# Patient Record
Sex: Female | Born: 1975
Health system: Southern US, Community
[De-identification: ages and names within clinical notes are randomized; demographics above are authoritative.]

## PROBLEM LIST (undated history)

## (undated) DIAGNOSIS — D509 Iron deficiency anemia, unspecified: Secondary | ICD-10-CM

## (undated) DIAGNOSIS — D649 Anemia, unspecified: Secondary | ICD-10-CM

## (undated) DIAGNOSIS — I10 Essential (primary) hypertension: Secondary | ICD-10-CM

## (undated) DIAGNOSIS — IMO0001 Reserved for inherently not codable concepts without codable children: Secondary | ICD-10-CM

## (undated) DIAGNOSIS — K603 Anal fistula, unspecified: Secondary | ICD-10-CM

## (undated) DIAGNOSIS — R918 Other nonspecific abnormal finding of lung field: Secondary | ICD-10-CM

## (undated) DIAGNOSIS — R51 Headache: Secondary | ICD-10-CM

## (undated) DIAGNOSIS — K5792 Diverticulitis of intestine, part unspecified, without perforation or abscess without bleeding: Secondary | ICD-10-CM

## (undated) DIAGNOSIS — B009 Herpesviral infection, unspecified: Secondary | ICD-10-CM

## (undated) DIAGNOSIS — Z8489 Family history of other specified conditions: Secondary | ICD-10-CM

## (undated) DIAGNOSIS — F909 Attention-deficit hyperactivity disorder, unspecified type: Secondary | ICD-10-CM

## (undated) DIAGNOSIS — F209 Schizophrenia, unspecified: Secondary | ICD-10-CM

## (undated) DIAGNOSIS — Z5189 Encounter for other specified aftercare: Secondary | ICD-10-CM

## (undated) DIAGNOSIS — Z8719 Personal history of other diseases of the digestive system: Secondary | ICD-10-CM

## (undated) DIAGNOSIS — E669 Obesity, unspecified: Secondary | ICD-10-CM

## (undated) DIAGNOSIS — F32A Depression, unspecified: Secondary | ICD-10-CM

## (undated) DIAGNOSIS — K760 Fatty (change of) liver, not elsewhere classified: Secondary | ICD-10-CM

## (undated) DIAGNOSIS — F429 Obsessive-compulsive disorder, unspecified: Secondary | ICD-10-CM

## (undated) DIAGNOSIS — Z860100 Personal history of colon polyps, unspecified: Secondary | ICD-10-CM

## (undated) DIAGNOSIS — D869 Sarcoidosis, unspecified: Secondary | ICD-10-CM

## (undated) DIAGNOSIS — J45909 Unspecified asthma, uncomplicated: Secondary | ICD-10-CM

## (undated) DIAGNOSIS — I639 Cerebral infarction, unspecified: Secondary | ICD-10-CM

## (undated) DIAGNOSIS — K8020 Calculus of gallbladder without cholecystitis without obstruction: Secondary | ICD-10-CM

## (undated) DIAGNOSIS — F319 Bipolar disorder, unspecified: Secondary | ICD-10-CM

## (undated) DIAGNOSIS — K219 Gastro-esophageal reflux disease without esophagitis: Secondary | ICD-10-CM

## (undated) DIAGNOSIS — F410 Panic disorder [episodic paroxysmal anxiety] without agoraphobia: Secondary | ICD-10-CM

## (undated) DIAGNOSIS — M1990 Unspecified osteoarthritis, unspecified site: Secondary | ICD-10-CM

## (undated) DIAGNOSIS — Z87442 Personal history of urinary calculi: Secondary | ICD-10-CM

## (undated) DIAGNOSIS — F329 Major depressive disorder, single episode, unspecified: Secondary | ICD-10-CM

## (undated) DIAGNOSIS — G4730 Sleep apnea, unspecified: Secondary | ICD-10-CM

## (undated) DIAGNOSIS — F419 Anxiety disorder, unspecified: Secondary | ICD-10-CM

## (undated) DIAGNOSIS — F1910 Other psychoactive substance abuse, uncomplicated: Secondary | ICD-10-CM

## (undated) DIAGNOSIS — Z933 Colostomy status: Secondary | ICD-10-CM

## (undated) DIAGNOSIS — R519 Headache, unspecified: Secondary | ICD-10-CM

## (undated) HISTORY — DX: Diverticulitis of intestine, part unspecified, without perforation or abscess without bleeding: K57.92

## (undated) HISTORY — PX: TREATMENT FISTULA ANAL: SUR1390

## (undated) HISTORY — PX: TONSILLECTOMY: SUR1361

## (undated) HISTORY — DX: Depression, unspecified: F32.A

## (undated) HISTORY — DX: Bipolar disorder, unspecified: F31.9

## (undated) HISTORY — DX: Cerebral infarction, unspecified: I63.9

## (undated) HISTORY — PX: HERNIA REPAIR: SHX51

## (undated) HISTORY — DX: Gastro-esophageal reflux disease without esophagitis: K21.9

## (undated) HISTORY — DX: Anxiety disorder, unspecified: F41.9

## (undated) HISTORY — DX: Encounter for other specified aftercare: Z51.89

## (undated) HISTORY — DX: Colostomy status: Z93.3

## (undated) HISTORY — DX: Major depressive disorder, single episode, unspecified: F32.9

## (undated) HISTORY — PX: OTHER SURGICAL HISTORY: SHX169

## (undated) HISTORY — DX: Other psychoactive substance abuse, uncomplicated: F19.10

---

## 1998-11-27 ENCOUNTER — Inpatient Hospital Stay (HOSPITAL_COMMUNITY): Admission: EM | Admit: 1998-11-27 | Discharge: 1998-12-04 | Payer: Self-pay | Admitting: *Deleted

## 2000-07-29 ENCOUNTER — Emergency Department (HOSPITAL_COMMUNITY): Admission: EM | Admit: 2000-07-29 | Discharge: 2000-07-30 | Payer: Self-pay | Admitting: Emergency Medicine

## 2000-09-27 ENCOUNTER — Emergency Department (HOSPITAL_COMMUNITY): Admission: EM | Admit: 2000-09-27 | Discharge: 2000-09-27 | Payer: Self-pay | Admitting: Internal Medicine

## 2001-07-17 ENCOUNTER — Emergency Department (HOSPITAL_COMMUNITY): Admission: EM | Admit: 2001-07-17 | Discharge: 2001-07-17 | Payer: Self-pay | Admitting: Emergency Medicine

## 2006-02-23 DIAGNOSIS — K5792 Diverticulitis of intestine, part unspecified, without perforation or abscess without bleeding: Secondary | ICD-10-CM

## 2006-02-23 HISTORY — PX: OTHER SURGICAL HISTORY: SHX169

## 2006-02-23 HISTORY — DX: Diverticulitis of intestine, part unspecified, without perforation or abscess without bleeding: K57.92

## 2006-04-24 ENCOUNTER — Emergency Department (HOSPITAL_COMMUNITY): Admission: EM | Admit: 2006-04-24 | Discharge: 2006-04-24 | Payer: Self-pay | Admitting: Emergency Medicine

## 2006-11-24 HISTORY — PX: OTHER SURGICAL HISTORY: SHX169

## 2007-07-25 HISTORY — PX: OTHER SURGICAL HISTORY: SHX169

## 2007-08-24 HISTORY — PX: OTHER SURGICAL HISTORY: SHX169

## 2007-10-25 HISTORY — PX: OTHER SURGICAL HISTORY: SHX169

## 2007-11-01 ENCOUNTER — Ambulatory Visit: Payer: Self-pay | Admitting: Internal Medicine

## 2007-11-03 ENCOUNTER — Ambulatory Visit (HOSPITAL_COMMUNITY): Admission: RE | Admit: 2007-11-03 | Discharge: 2007-11-03 | Payer: Self-pay | Admitting: Internal Medicine

## 2007-12-05 ENCOUNTER — Ambulatory Visit (HOSPITAL_COMMUNITY): Admission: RE | Admit: 2007-12-05 | Discharge: 2007-12-05 | Payer: Self-pay | Admitting: Internal Medicine

## 2008-01-05 ENCOUNTER — Ambulatory Visit (HOSPITAL_COMMUNITY): Admission: RE | Admit: 2008-01-05 | Discharge: 2008-01-05 | Payer: Self-pay | Admitting: Internal Medicine

## 2008-01-05 ENCOUNTER — Ambulatory Visit: Payer: Self-pay | Admitting: Internal Medicine

## 2008-04-24 ENCOUNTER — Other Ambulatory Visit: Admission: RE | Admit: 2008-04-24 | Discharge: 2008-04-24 | Payer: Self-pay | Admitting: Obstetrics & Gynecology

## 2010-03-04 ENCOUNTER — Encounter: Payer: Self-pay | Admitting: Internal Medicine

## 2010-03-04 ENCOUNTER — Ambulatory Visit
Admission: RE | Admit: 2010-03-04 | Discharge: 2010-03-04 | Payer: Self-pay | Source: Home / Self Care | Attending: Gastroenterology | Admitting: Gastroenterology

## 2010-03-04 ENCOUNTER — Encounter: Payer: Self-pay | Admitting: Gastroenterology

## 2010-03-04 DIAGNOSIS — K219 Gastro-esophageal reflux disease without esophagitis: Secondary | ICD-10-CM | POA: Insufficient documentation

## 2010-03-04 DIAGNOSIS — R1310 Dysphagia, unspecified: Secondary | ICD-10-CM | POA: Insufficient documentation

## 2010-03-04 DIAGNOSIS — R1319 Other dysphagia: Secondary | ICD-10-CM | POA: Insufficient documentation

## 2010-03-04 DIAGNOSIS — K625 Hemorrhage of anus and rectum: Secondary | ICD-10-CM | POA: Insufficient documentation

## 2010-03-04 DIAGNOSIS — R1011 Right upper quadrant pain: Secondary | ICD-10-CM | POA: Insufficient documentation

## 2010-03-05 ENCOUNTER — Encounter: Payer: Self-pay | Admitting: Internal Medicine

## 2010-03-06 LAB — CONVERTED CEMR LAB
ALT: 14 U/L (ref 0–35)
AST: 15 U/L (ref 0–37)
Albumin: 4.8 g/dL (ref 3.5–5.2)
Alkaline Phosphatase: 53 U/L (ref 39–117)
Amylase: 39 U/L (ref 0–105)
BUN: 13 mg/dL (ref 6–23)
Basophils Absolute: 0 K/uL (ref 0.0–0.1)
Basophils Relative: 0 % (ref 0–1)
CO2: 25 meq/L (ref 19–32)
Calcium: 9.3 mg/dL (ref 8.4–10.5)
Chloride: 102 meq/L (ref 96–112)
Creatinine, Ser: 0.69 mg/dL (ref 0.40–1.20)
Eosinophils Absolute: 0.2 K/uL (ref 0.0–0.7)
Eosinophils Relative: 3 % (ref 0–5)
Glucose, Bld: 96 mg/dL (ref 70–99)
HCT: 41.7 % (ref 36.0–46.0)
Hemoglobin: 13.5 g/dL (ref 12.0–15.0)
Lipase: 19 U/L (ref 0–75)
Lymphocytes Relative: 28 % (ref 12–46)
Lymphs Abs: 1.9 K/uL (ref 0.7–4.0)
MCHC: 32.4 g/dL (ref 30.0–36.0)
MCV: 93.5 fL (ref 78.0–100.0)
Monocytes Absolute: 0.4 K/uL (ref 0.1–1.0)
Monocytes Relative: 6 % (ref 3–12)
Neutro Abs: 4.3 K/uL (ref 1.7–7.7)
Neutrophils Relative %: 62 % (ref 43–77)
Platelets: 215 K/uL (ref 150–400)
Potassium: 4.1 meq/L (ref 3.5–5.3)
RBC: 4.46 M/uL (ref 3.87–5.11)
RDW: 13.9 % (ref 11.5–15.5)
Sodium: 140 meq/L (ref 135–145)
Total Bilirubin: 0.3 mg/dL (ref 0.3–1.2)
Total Protein: 7 g/dL (ref 6.0–8.3)
WBC: 6.8 10*3/microliter (ref 4.0–10.5)

## 2010-03-07 ENCOUNTER — Ambulatory Visit (HOSPITAL_COMMUNITY)
Admission: RE | Admit: 2010-03-07 | Discharge: 2010-03-07 | Payer: Self-pay | Source: Home / Self Care | Attending: Internal Medicine | Admitting: Internal Medicine

## 2010-03-10 LAB — HCG, QUANTITATIVE, PREGNANCY: hCG, Beta Chain, Quant, S: <2 m[IU]/mL (ref <5)

## 2010-03-11 ENCOUNTER — Encounter: Payer: Self-pay | Admitting: Internal Medicine

## 2010-03-13 ENCOUNTER — Ambulatory Visit (HOSPITAL_COMMUNITY)
Admission: RE | Admit: 2010-03-13 | Discharge: 2010-03-13 | Payer: Self-pay | Source: Home / Self Care | Attending: Internal Medicine | Admitting: Internal Medicine

## 2010-03-13 HISTORY — PX: COLONOSCOPY: SHX5424

## 2010-03-13 HISTORY — PX: ESOPHAGOGASTRODUODENOSCOPY: SHX1529

## 2010-03-14 ENCOUNTER — Encounter (HOSPITAL_COMMUNITY)
Admission: RE | Admit: 2010-03-14 | Discharge: 2010-03-25 | Payer: Self-pay | Source: Home / Self Care | Attending: Internal Medicine | Admitting: Internal Medicine

## 2010-03-21 ENCOUNTER — Encounter: Payer: Self-pay | Admitting: Internal Medicine

## 2010-03-25 NOTE — Op Note (Signed)
[name], [name]                ACCOUNT NO.:  [id number]  MEDICAL RECORD NO.:  [id number]          PATIENT TYPE:  AMB  LOCATION:  DAY                           FACILITY:  [facility]  PHYSICIAN:  R. Roetta Sessions, M.D. DATE OF BIRTH:  01-20-1976  DATE OF PROCEDURE:  03/13/2010 DATE OF DISCHARGE:                              OPERATIVE REPORT   PROCEDURE:  Esophagogastroduodenoscopy with [name] dilation followed by diagnostic colonoscopy.  INDICATIONS FOR PROCEDURE:  A 35 year old lady with intermittent right upper quadrant abdominal pain, esophageal dysphagia to solid food, and intermittent medication setting of constipation.  She has known cholelithiasis without evidence of cholecystitis on recent ultrasound, Chem 20, amylase and lipase came back normal.  EGD and colonoscopy now being done.  Risks, benefits, limitations, imponderables, alternative have been discussed, questions answered.  Please see the documentation medical record.  PROCEDURE NOTE:  O2 saturation, blood pressure, pulse, and respirations monitored throughout the entire procedure.  CONSCIOUS SEDATION:  Because of polypharmacy she is being done under propofol with help of Dr. [name].  Cetacaine spray for topical pharyngeal anesthesia.  INSTRUMENT:  Pentax video chip system.  FINDINGS:  EGD examination of the tubular esophagus revealed no mucosal abnormalities.  EG junction easily traversed.  Stomach:  Gastric cavity was emptied, insufflated well with air.  A thorough examination of the gastric mucosa including retroflex of the proximal stomach esophagogastric junction demonstrated multiple antral erosions and a small hiatal hernia, otherwise, there was no evidence of peptic ulcer disease, infiltrate process, or other abnormality.  Pylorus was patent, easily traversed.  Examination of the bulb and second portion revealed no abnormalities.  THERAPEUTIC/DIAGNOSTIC MANEUVERS PERFORMED:  Scope was  withdrawn, a 54- [place] Maloney dilators passed in full insertion.  A look back revealed no apparent complication related to passage of dilation.  Subsequent biopsies taken for histologic study.  The patient tolerated the procedure well and was prepared for colonoscopy.  Digital rectal exam revealed a single external hemorrhoidal tag, otherwise negative.  ENDOSCOPIC FINDINGS:  Prep was adequate.  Colonic mucosa was surveyed from the rectosigmoid junction through the left transverse right colon to the appendiceal orifice, ileocecal valve/cecum.  These structures were well seen and photographed for the record.  Terminal ileum was intubated to 10 cm.  From this level, the scope was slowly and cautiously withdrawn.  All previously mentioned mucosal surfaces were again seen.  The patient had a surgical anastomosis at 10 cm of the anal verge.  The residual colonic mucosa was well seen.  The patient had pancolonic diverticula.  The terminal ileum mucosa appeared normal.  The scope was pulled down the rectum where thorough examination of rectal mucosa including retroflexed view of the anal verge demonstrated no abnormalities.  The patient tolerated the procedure well.  Cecal withdrawal time 6 minutes.  IMPRESSION: 1. Normal-appearing esophagus, status post passage of a Maloney     dilator. 2. Small hiatal hernia. 3. Antral erosions, status post biopsy, otherwise stomach     unremarkable.  Patent pylorus, normal D1-D2.  Colonoscopy findings,     external hemorrhoids likely cause of hematochezia, otherwise normal  rectum.  Surgical anastomosis 10 cm of anal verge, pancolonic     diverticula, remainder of residual colonic mucosa and terminal     mucosa appeared normal.  RECOMMENDATIONS: 1. Pettey finishing up a 10-day course of Anusol-HC suppositories. 2. Continue fiber supplement daily. 3. Continue Prilosec 20 mg orally before breakfast daily. 4. Follow up on path.  Her right upper  quadrant symptoms are not explained by findings on today's EGD.  She does have chronic constipation, likely a separate issue.  Plans are being made for to have a HIDA with fatty meal challenge tomorrow to further evaluate for potential gallbladder etiology, given she does have stones, although her symptoms are somewhat atypical from biliary etiology.  In addition, we will provide her with diverticulosis, hemorrhoid, and constipation literature.  I advised her to take MiraLax 17 g orally nightly everyday as needed if no bowel movement on any given day, further recommendations to follow.     [name], M.D.     RMR/MEDQ  D:  03/13/2010  T:  03/13/2010  Job:  [id number]  cc:   Kirk Ruths, M.D. Fax: [phone]  Electronically Signed by [name] M.D. on 03/25/2010 01:38:23 PM

## 2010-03-27 NOTE — Letter (Signed)
Summary: EGD/ED ORDER  EGD/ED ORDER   Imported By: Ave Filter 03/04/2010 09:46:56  _____________________________________________________________________  External Attachment:    Type:   Image     Comment:   External Document

## 2010-03-27 NOTE — Letter (Addendum)
Summary: Patient Notice, Endo Biopsy Results  [facility]  [address]   [place], [place] [address]   Phone: [phone]  Fax: [phone]       March 21, 2010   [name] [address] [place], [place]  [address] 1975/06/05    Dear Ms. Magaw,  I am pleased to inform you that the biopsies taken during your recent endoscopic examination did not show any evidence of cancer upon pathologic examination.  There was only mild inflammation.  Additional information/recommendations:  No further action is needed at this time.  Please follow-up with your primary care physician for your other healthcare needs.  Continue with the treatment plan as outlined on the day of your exam.  Please call us if you are having persistent problems or have questions about your condition that have not been fully answered at this time.  Sincerely,    R. Roetta Sessions MD, FACP [facility] Ph: [phone]   Fax: [phone]   Appended Document: Patient Notice, Endo Biopsy Results letter mailed to pt

## 2010-03-27 NOTE — Letter (Signed)
Summary: HIDA SCAN ORDER  HIDA SCAN ORDER   Imported By: Ave Filter 03/11/2010 09:10:25  _____________________________________________________________________  External Attachment:    Type:   Image     Comment:   External Document

## 2010-03-27 NOTE — Letter (Signed)
Summary: ABD U/S ORDER  ABD U/S ORDER   Imported By: Ave Filter 03/04/2010 09:46:27  _____________________________________________________________________  External Attachment:    Type:   Image     Comment:   External Document  Appended Document: ABD U/S ORDER [id number]

## 2010-03-27 NOTE — Assessment & Plan Note (Signed)
Summary: BRBPR/LAW   Visit Type:  Follow-up Visit Primary Care Provider:  Dr. Regino Schultze  CC:  brbpr, indigestion, and pain on R side.  History of Present Illness: [name] is here to schedule a colonoscopy. Reports brbpr for approximately 6 mos, on tissue and in toilet after BM. Denies rectal pain or itching. BM once every 2-3 weeks unless she takes ex-lax. Not taking daily fiber supplements. Not on stool softeners. Doesn't like taste of miralax. hx of reflux, but for one month has been experiencing mid-back pain, like a knife twisting, 10/10, worsened by eating/drinking. No epigastric pain. Does have relief with Tums, but if eats food is worsened. Eating approximately 10 tums/day. +esophageal dysphagia, vomiting every few meals, worse with sweets such as candy. c/o RUQ pain only with ex-lax. Reports at least X 1 year. feels like a "fist punching", 8/10. Denies nausea. Recently in hospital November 2011 at [facility] (mental health admission) secondary to severe depression, hopeless. Switched some medications. Feels better now, denies suicidal ideations.     Nov 2009: Friable anal canal hemorrhoids, otherwise normal rectum, anastomosis   with small bowel 15 cm and anastomosis was patent, but also somewhat   friable; single distal colonic diverticulum.  Remainder of colonic   mucosa and terminal ileal mucosa appeared normal.   Current Medications (verified): 1)  Cerefolin Nac .... Once Daily 2)  Depakote 250 Mg Tbec (Divalproex Sodium) .... At Bedtime 3)  Depakote 500 Mg Tbec (Divalproex Sodium) .... 2 At Bedtime 4)  Trilafon 2mg  .... Two Times A Day 5)  Trilafon 4mg  .... 3 At Bedtime 6)  Latuda 80 Mg Tabs (Lurasidone Hcl) .... Once Daily 7)  Ativan 1 Mg Tabs (Lorazepam) .... At Bedtime 8)  Adderall 10 Mg Tabs (Amphetamine-Dextroamphetamine) .... Two Times A Day 9)  Tums .... As Needed 10)  Ex-Lax .... As Needed 11)  Dulcolax .... As Needed  Allergies (verified): No Known Drug  Allergies  Past History:  Past Medical History: GERD perforated diverticulitis 2008 requiring resection, colostomy, later colostomy take-down Bipolar depression anxiety  Past Surgical History: exploratory laparotomy with resection in 2008: perforated diverticulitis, colostomy placed Oct 2008: colostomy reversal June 2009: abdominal wall hernia, open repair with lysis of adhesions hx abd wall seroma July 2009 drained via [place] in [place], [place], Sept 2009 drained again by Dr. Leticia Penna in office tonsillectomy kidney stones  Family History: Mother:living, none Father:living, none  Maternal Grandmother: hx of esophageal dilations, GERD, Bipolar No FH of Colon Cancer: Maternal Grandfather: lung ca  Social History: Unemployed Single, lives with family 1 child, boy, age 63 Patient currently smokes. 1ppd X 10 years Alcohol Use - not currently, hx of social ETOH use in distant past Denies drug use now or in past (however notes in chart state +hx of marijuana use) Smoking Status:  current  Review of Systems General:  Denies fever, chills, and anorexia. Eyes:  Denies blurring, irritation, and discharge. ENT:  Complains of difficulty swallowing; denies sore throat and hoarseness. CV:  Denies chest pains, syncope, and dyspnea on exertion. Resp:  Denies dyspnea at rest and wheezing. GI:  Complains of difficulty swallowing, indigestion/heartburn, abdominal pain, constipation, and bloody BM's; denies vomiting. GU:  Denies urinary burning, blood in urine, and urinary frequency. MS:  Denies joint pain / LOM, joint swelling, and joint stiffness. Derm:  Denies rash, itching, and dry skin. Neuro:  Denies weakness and syncope. Psych:  Denies suicidal ideation, hallucinations, and paranoia. Endo:  Denies cold intolerance and heat intolerance. Heme:  Denies bruising and bleeding.  Vital Signs:  Patient profile:   35 year old [name] Height:      68 inches Weight:      218 pounds BMI:      Melissa.27 Temp:     97.8 degrees F oral Pulse rate:   72 / minute BP sitting:   130 / 92  (left arm) Cuff size:   large  Vitals Entered By: [name] (March 04, 2010 8:27 AM)  Physical Exam  General:  healthy, well-dressed, somewhat flat affect, of stated age. Head:  Normocephalic and atraumatic. Eyes:  sclera without icterus Mouth:  No deformity or lesions, dentition normal. Lungs:  Clear throughout to auscultation. Heart:  Regular rate and rhythm; no murmurs, rubs,  or bruits. Abdomen:  +BS, mild tenderness RUQ, no distention, no HSM, no rebound or guarding. Msk:  Symmetrical with no gross deformities. Normal posture. Pulses:  Normal pulses noted. Extremities:  No clubbing, cyanosis, edema or deformities noted. Neurologic:  Alert and  oriented x4;  grossly normal neurologically. Psych:  somewhat flat affect  Impression & Recommendations:  Problem # 1:  RECTAL BLEEDING (ICD-60.29) 35 year old [name] with unfortunate hx of perforated diverticulitis requiring resection, colostomy, later reversal. Last colonoscopy in 2009 showing friable anal canal hemorrhoids, see HPI for further report. +brbpr X 6 mos on tissue and small amount after BM. Chronic constipation with sometimes BM every 2-3 weeks. No current fiber or stool softeners, does not like mirlax. Likely low-volume hematochezia secondary to hx of hemorrhoids exacerbated by constipation. Will d/w Dr. Jena [name] need for repeat colonoscopy and/or schedule for future colonoscopies.   Fiber supplement of choice daily (samples given) Probiotic daily (samples given) Anusol 25 mg supp per rectum two times a day X 10 days Will check labs to assess for anemia, d/w Dr. Jena [name] need for repeat colonoscopy secondary to [name]'s hx  Orders: T-CBC w/Diff [id number]) T-Amylase [id number]) T-Lipase [phone]) T-Comprehensive Metabolic Panel ([id number]) Est. Patient Level IV (53664)  Problem # 2:  RUQ PAIN (ICD-789.01) RUQ pain X 1  year, only occurs when ingesting laxative, "fist punching", 8/10. No nausea. Also reports mid-back pain, like a knife twisting, 10/10, worsened by eating/drinking. NO epigastric pain or discomfort. Relieved by Tums, now eating 10 tums/day. + esophageal dysphagia with +emesis every few meals per [name]. hx of chronic reflux. Doubt biliary or pancreatic component, but will assess with [place] and labs. Diff dx include gastritis, PUD, uncontrolled GERD, esophageal web, stricture, or ring.   EGD/ED with Dr. Jena [name] in near future in OR secondary to polypharmacy: the risks, benefits, alternatives have been discussed in detail; [name] wishes to proceed. [place] of abdomen to assess for gallstones, consider HIDA scan if EGD negative and/or stones/sludge on [place] Prilosec 20 mg by mouth 30 minutes prior to meals daily Labs to include lipase, amylase, CMP  Orders: T-CBC w/Diff ([id number]) T-Amylase ([id number]) T-Lipase ([id number]) T-Comprehensive Metabolic Panel ([id number]) Est. Patient Level IV ([address])  Problem # 3:  GERD (ICD-530.81) See # 2 Orders: T-CBC w/Diff ([id number]) T-Amylase ([id number]) T-Lipase ([id number]) T-Comprehensive Metabolic Panel (06269-48546) Est. Patient Level IV ([address])  Problem # 4:  DYSPHAGIA (KKX-381.82) See # 2  Patient Instructions: 1)  Take Prilosec 20 mg by mouth 30 minutes before morning meal daily. Samples given 2)  Anusol suppositories twice a day X 10 days 3)  Fiber supplement of your choice daily (metamucil samples given) 4)  Probiotic daily (align samples given) 5)  We have set you up for an Ultrasound  of your belly and will call you with the results and further work-up 6)  We have also set you up for an upper endoscopy due to your difficulty swallowing and pain. We will discuss the need for a colonoscopy as well. 7)  Please get the labwork done, and we will notify you of the results. 8)  The medication list was reviewed and reconciled.  All changed / newly  prescribed medications were explained.  A complete medication list was provided to the patient / caregiver.  Prescriptions: ALIGN  CAPS (PROBIOTIC PRODUCT) take 1 by mouth daily  #31 x 1   Entered and Authorized by:   Gerrit Halls NP   Signed by:   Gerrit Halls NP on 03/04/2010   Method used:   Faxed to ...       [facility]  [address] [phone]* (retail)       [address]       [place], [place]  [address]       Ph: [phone]       Fax: [phone]   RxID:   [id number] ANUSOL-HC 25 MG SUPP (HYDROCORTISONE ACETATE) 1 per rectum two times a day X 10 days  #20 x 0   Entered and Authorized by:   Gerrit Halls NP   Signed by:   Gerrit Halls NP on 03/04/2010   Method used:   Faxed to ...       [facility] [phone][facility] (retail)       [address]       [place], [place]  [address]       Ph: [phone]       Fax: [phone]   RxID:   [id number] PRILOSEC 20 MG CPDR (OMEPRAZOLE) take 1 by mouth daily 30 minutes before meals  #31 x 1   Entered and Authorized by:   Gerrit Halls NP   Signed by:   Gerrit Halls NP on 03/04/2010   Method used:   Faxed to ...       [facility] [phone]* (retail)       [address]       [place], [place]  [address]       Ph: [phone]       Fax: [phone]   RxID:   [id number]   Appended Document: BRBPR/LAW AFter discussion with Dr. Jena [name], will add colonoscopy to EGD to be done in the OR secondary to polypharmacy. Due to [name]'s significant hx as well as new onset brbpr, warrants further investigation.   Appended Document: BRBPR/LAW I added tcs to the egd with ed in the or.New order faxed to endo.

## 2010-03-27 NOTE — Letter (Signed)
Summary: [facility]/EGD/ED ODER  [facility]/EGD/ED ODER   Imported By: Rexene Alberts 03/05/2010 14:31:56  _____________________________________________________________________  External Attachment:    Type:   Image     Comment:   External Document

## 2010-07-08 NOTE — Op Note (Signed)
[name], [name]                ACCOUNT NO.:  [id number]   MEDICAL RECORD NO.:  [id number]          PATIENT TYPE:  AMB   LOCATION:  DAY                           FACILITY:  [facility]   PHYSICIAN:  R. Roetta Sessions, M.D. DATE OF BIRTH:  [birth year]   DATE OF PROCEDURE:  01/05/2008  DATE OF DISCHARGE:                               OPERATIVE REPORT   PROCEDURE:  Diagnostic colonoscopy.   INDICATIONS FOR PROCEDURE:  A 35 year old lady has had complicated  diverticulitis with abscess requiring a segmental resection last year  down at [place], [place] subsequent to takedown.  Postoperative course was complicated by large seroma.  She moved up this  way and had seen [place] and Dr. Leticia Penna.  Recently, Dr. Leticia Penna arranged to  have the seroma drained.  This has been associated with marked  improvement in [name]' abdominal wall pain, but she is still having  some symptoms and she tells me that Dr. Leticia Penna is contemplating a  followup CT later this month.  He has had intermittent hematochezia.  There is no history of chronic GI or liver illness.  She has never had a  lower GI tract image intraluminally.  Colonoscopy is now being done.  Risks, benefits, and alternatives have been reviewed and questions  answered.  Please see the documentation in the medical record.   PROCEDURE NOTE:  O2 saturation, blood pressure, pulse, and respirations  were monitored throughout the entire procedure.   CONSCIOUS SEDATION:  Versed 7 mg IV, Demerol 150 mg IV in divided doses,  and Phenergan 25 mg IV diluted slow IV push to augment conscious  sedation.   FINDINGS:  Digital rectal exam revealed no abnormalities.  Endoscopic  findings:  The prep was marginal to poor.  The examination of rectal  mucosa including retroflexed view of the anal verge demonstrated no  abnormalities.  On fluoroscopy, the anal canal however, deal with  demonstrated friable anal canal hemorrhoids.  The surgical anastomosis  was  slightly friable as well as readily identified at 15 cm from the  anal verge, it otherwise appeared normal.  The anastomosis was widely  patent.  Examination of the residual colonic mucosa was undertaken from  the anastomosis all the way to the cecum.  Cecum, ileocecal valve, and  appendiceal orifice were well seen and photographed for the record.  From this level, scope was slowly and cautiously withdrawn.  All  previously mentioned mucosal surfaces were again seen.  Terminal ileum  was elevated to 10 cm.  The residual colonic mucosa appeared normal  except for a single distal colonic diverticulum.  The remainder of  colonic mucosa and terminal ileal mucosa appeared normal.  The patient  tolerated the procedure well and was reactive to Endoscopy.   IMPRESSION:  Friable anal canal, otherwise normal rectum, anastomosis  with small bowel 15 cm and anastomosis was patent, but also somewhat  friable; single distal colonic diverticulum.  Remainder of colonic  mucosa and terminal ileal mucosa appeared normal.   RECOMMENDATIONS:  I told [name] she ought to be prudent for taking  daily fiber  supplement.  We will give her a 10-day course of Anusol-HC  suppositories and prior diverticulosis and hemorrhoid literature   Strongly recommended that she follow up with Dr. Leticia Penna in regards to  any further potential evaluation of her seroma.      Jonathon Bellows, M.D.  Electronically Signed     RMR/MEDQ  D:  01/05/2008  T:  01/05/2008  Job:  [id number]   cc:   Tilford Pillar, MD  Fax: [phone]   Madelin Rear. Sherwood Gambler, MD  Fax: [phone]

## 2010-07-08 NOTE — Assessment & Plan Note (Signed)
[name], [name]                 CHART#:  [id number]   DATE:  11/01/2007                       DOB:  Jun 23, 1975   CHIEF COMPLAINT:  Needed colonoscopy in November and abdominal fluid,  needs draining.   HISTORY OF PRESENT ILLNESS:  The patient is a 35 year old Caucasian  female who presents today as a self-referral for the above-stated  symptoms.  Her GI history is complicated in the fact that she was  unfortunate enough to have perforated diverticulitis back in June 2008.  She states prior to that episode, she had had some chronic off and on  abdominal pain and had been treated empirically with some antibiotics.  In April 2008, she has had some blood in her stool.  In June 2008, she  presented with abdominal pain.  Per her report, they did not see the  diverticulitis on the CT and had to have an exploratory surgery.  She  was found to have a perforated viscus from diverticulitis.  She had a  colostomy.  She recovered from that surgery okay and continued to have  some left lower quadrant abdominal pain.  In October 2008, she had a  reversal of her colostomy.  She states she has had alternating bowel  movements with 3 and 4 loose stools daily and may skip a day at a time  without any BMs.  She has a lot of discomfort in left lower quadrant as  well as the right upper quadrant region.  Because of some abdominal wall  hernias, she actually had a third surgery in June 2009 with repair of  abdominal wall hernias as well as lysis of adhesions.  She continues to  have pulling in the left lower quadrant.  She complains of stabbing-like  pains here as well as some pain in her back.  She has had some  hematochezia couple of months ago.  She developed according to medical  records, recurrent abdominal wall seroma.  She states she had this  aspirated on one occasion via ultrasound guidance.  The records we  received show that this was done on September 16, 2007.  The cytology report  revealed  abundant blood with blood elements consistent of neutrophils,  lymphocytes, eosinophils, few macrophages, and scattered plasma cells,  but no malignant cells.  She had a total of 1.7 liters removed.  Analysis showed 79,000 red cells and 350 white cells, but no culture was  done.  The patient states she took a course of Cipro for this.  She  states she was also given Levaquin, which she was instructed to take  after she have the next draining done, however, she moved in the interim  and this has not been done.  She feels like she has a significant amount  of fluid in her belly.  She complains of abdominal pain associated with  this.  She is concerned she may be developing diverticulitis in the  right colon as well.  She has never had a colonoscopy.   CURRENT MEDICATIONS:  Lexapro 20 mg nightly, hydrocodone 2 daily, and  Prevacid p.r.n.   ALLERGIES:  No known drug allergies.   PAST MEDICAL HISTORY:  History of complicated diverticulitis with  perforation, requiring colostomy as outlined above.  Reversal of  colostomy in October 2008.  Surgery for hernia repair and  adhesions in  June 2009.  Prior tonsillectomy, surgery for kidney stones, and history  of GERD/LPR seen by ENT before presented with hoarseness.  History of  herpes simplex for which she takes Valtrex on occasion.   FAMILY HISTORY:  Negative for colorectal cancer.   SOCIAL HISTORY:  She is single.  She has 2 children.  She is unemployed.  She smokes fourth pack of cigarettes daily.  She denies any alcohol use,  but according to Dr. Sharlene Dory in July 2009, she reported alcoholic  beverages 3-5 times a week as well as a history of marijuana use.   REVIEW OF SYSTEMS:  See HPI for GI.  CONSTITUTIONAL:  Denies any weight  loss.  CARDIOPULMONARY:  Denies any chest pain or shortness of breath.  GENITOURINARY:  Denies any dysuria and hematuria.   PHYSICAL EXAMINATION:  VITAL SIGNS:  Weight 215, height 5 feet 8 inches,  temperature  98, blood pressure 122/84, and pulse 76.  GENERAL:  Pleasant, well-groomed, well-nourished, and well-developed  Caucasian female, in no acute distress.  She does seem to respond  somewhat slowly verbally.  Speech is not slurred and she is alert during  the exam.  SKIN:  Warm and dry.  No jaundice.  HEENT:  Sclerae nonicteric.  Oropharyngeal mucosa moist and pink.  CHEST:  Lungs are clear to auscultation.  CARDIAC:  Regular rate and rhythm.  Normal S1 and S2.  No murmurs, rubs,  or gallops.  ABDOMEN:  Positive bowel sounds.  Abdomen is soft.  She has a large  midline incisional scar well healed as well as scar in the left mid  abdomen from prior colostomy.  Abdomen is very soft.  She has some mild  tenderness in the left mid to left lower quadrant region as well as the  right upper quadrant region.  No rebound or guarding.  No organomegaly  or masses.  No abdominal bruits or hernias.  LOWER EXTREMITIES:  No edema.   LABS:  From September 16, 2007, hemoglobin 11.6, platelets 283,000, and white  count 6300.   IMPRESSION:  The patient is of 35 year old lady with history of  complicated diverticulitis with perforation, requiring resection and  colostomy with eventual reversal of her colostomy.  She has since had  abdominal wall hernias and lysis of adhesions in June 2009.  She has  developed according to her history, recurrent abdominal wall seroma.  She is now transferring her care to Dr. Jena Gauss given that she has  recently moved back to the area.  She continues to have ongoing  abdominal pain in the left abdomen as well as right upper quadrant  region.  She continues to have multiple loose stools a day alternating  with occasional constipation.  She has intermittent low-volume  hematochezia.  At this point in time, we need to pursue reassessment of  her abdominal pain and history of abdominal wall fluid.  I have  discussed case with Dr. Jena Gauss.  He will begin with a CT of the abdomen  and  pelvis.  We will make further recommendations based on that study.  She does need to have a colonoscopy at some point in the near future.  We will plan on the end of October for now.   PLAN:  1. Colonoscopy with Dr. Jena Gauss in October given her history of      complicated diverticulitis and ongoing intermittent hematochezia.  2. She will continue Prevacid as before for reflux.  3. Retrieve her last CT report  done in September 12, 2007, at [facility]      in [place].  4. CT of the abdomen and pelvis with IV and oral contrast now.  5. Further recommendations to follow.       Tana Coast, P.A.  Electronically Signed     R. Roetta Sessions, M.D.  Electronically Signed    LL/MEDQ  D:  11/01/2007  T:  11/01/2007  Job:  [id number]   cc:   Madelin Rear. Sherwood Gambler, MD

## 2010-10-12 ENCOUNTER — Encounter (HOSPITAL_COMMUNITY): Payer: Self-pay | Admitting: *Deleted

## 2010-10-12 ENCOUNTER — Emergency Department (HOSPITAL_COMMUNITY): Payer: Medicaid Other

## 2010-10-12 ENCOUNTER — Emergency Department (HOSPITAL_COMMUNITY)
Admission: EM | Admit: 2010-10-12 | Discharge: 2010-10-12 | Disposition: A | Discharge status: Home Health | Payer: Medicaid Other | Attending: Emergency Medicine | Admitting: Emergency Medicine

## 2010-10-12 DIAGNOSIS — R109 Unspecified abdominal pain: Secondary | ICD-10-CM | POA: Insufficient documentation

## 2010-10-12 DIAGNOSIS — R1084 Generalized abdominal pain: Secondary | ICD-10-CM

## 2010-10-12 LAB — PREGNANCY, URINE: Preg Test, Ur: NEGATIVE

## 2010-10-12 LAB — DIFFERENTIAL
Basophils Absolute: 0 10*3/uL (ref 0.0–0.1)
Basophils Relative: 0 % (ref 0–1)
Eosinophils Absolute: 0.4 10*3/uL (ref 0.0–0.7)
Eosinophils Relative: 4 % (ref 0–5)
Lymphocytes Relative: 31 % (ref 12–46)
Lymphs Abs: 2.8 10*3/uL (ref 0.7–4.0)
Monocytes Absolute: 0.6 10*3/uL (ref 0.1–1.0)
Monocytes Relative: 6 % (ref 3–12)
Neutro Abs: 5.4 10*3/uL (ref 1.7–7.7)
Neutrophils Relative %: 59 % (ref 43–77)

## 2010-10-12 LAB — COMPREHENSIVE METABOLIC PANEL WITH GFR
ALT: 37 U/L — ABNORMAL HIGH (ref 0–35)
AST: 15 U/L (ref 0–37)
Albumin: 3.6 g/dL (ref 3.5–5.2)
Alkaline Phosphatase: 70 U/L (ref 39–117)
BUN: 11 mg/dL (ref 6–23)
CO2: 26 meq/L (ref 19–32)
Calcium: 8.8 mg/dL (ref 8.4–10.5)
Chloride: 102 meq/L (ref 96–112)
Creatinine, Ser: 0.74 mg/dL (ref 0.50–1.10)
GFR calc Af Amer: >60 mL/min
GFR calc non Af Amer: >60 mL/min
Glucose, Bld: 107 mg/dL — ABNORMAL HIGH (ref 70–99)
Potassium: 3.4 meq/L — ABNORMAL LOW (ref 3.5–5.1)
Sodium: 138 meq/L (ref 135–145)
Total Bilirubin: 0.1 mg/dL — ABNORMAL LOW (ref 0.3–1.2)
Total Protein: 6.6 g/dL (ref 6.0–8.3)

## 2010-10-12 LAB — CBC
HCT: 35.2 % — ABNORMAL LOW (ref 36.0–46.0)
Hemoglobin: 12 g/dL (ref 12.0–15.0)
MCH: 29.9 pg (ref 26.0–34.0)
MCHC: 34.1 g/dL (ref 30.0–36.0)
MCV: 87.6 fL (ref 78.0–100.0)
Platelets: 240 K/uL (ref 150–400)
RBC: 4.02 MIL/uL (ref 3.87–5.11)
RDW: 13.7 % (ref 11.5–15.5)
WBC: 9.2 K/uL (ref 4.0–10.5)

## 2010-10-12 LAB — URINALYSIS, ROUTINE W REFLEX MICROSCOPIC
Bilirubin Urine: NEGATIVE
Glucose, UA: NEGATIVE mg/dL
Hgb urine dipstick: NEGATIVE
Ketones, ur: NEGATIVE mg/dL
Leukocytes, UA: NEGATIVE
Nitrite: NEGATIVE
Protein, ur: NEGATIVE mg/dL
Specific Gravity, Urine: >1.03 — ABNORMAL HIGH (ref 1.005–1.030)
Urobilinogen, UA: 0.2 mg/dL (ref 0.0–1.0)
pH: 6 (ref 5.0–8.0)

## 2010-10-12 LAB — LIPASE, BLOOD: Lipase: 46 U/L (ref 11–59)

## 2010-10-12 MED ORDER — HYDROCODONE-ACETAMINOPHEN 5-500 MG PO TABS: 1.0000 | ORAL_TABLET | Freq: Four times a day (QID) | ORAL | Status: AC | PRN

## 2010-10-12 MED ORDER — IOHEXOL 300 MG/ML  SOLN
100.0000 mL | Freq: Once | INTRAMUSCULAR | Status: AC | PRN
  Administered 2010-10-12: 100 mL via INTRAVENOUS

## 2010-10-12 NOTE — ED Notes (Signed)
Pt c/o pain to mid abd. Pt states she has been hurting for months but has recently gotten worse. Pt states she feels like her "belly button has torn loose"

## 2010-10-12 NOTE — ED Provider Notes (Signed)
History     CSN: [id number] Arrival date & time: 10/12/2010 12:24 PM  Chief Complaint  Patient presents with  . 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 She has a followup appointment with her regular doctor on the 27th. Patient had all previous surgeries performed in [place], [place]. Patient reports that she has had prior hernia as surrounding her surgical scar. She also reports that she has had seromas that have required drainage in the past.  Patient is a 35 y.o. female presenting with abdominal pain. The history is provided by the patient. No language interpreter was used.  Abdominal Pain The primary symptoms of the illness include abdominal pain. The primary symptoms of the illness do not include fever, nausea, vomiting, diarrhea, dysuria or vaginal discharge. The current episode started more than 2 days ago (One month). The onset of the illness was gradual. The problem has been gradually worsening.  The abdominal pain is located in the periumbilical region (This is localized around the patient's prior scar from laparotomy for colonic perforation after diverticulitis). The abdominal pain does not radiate. The severity of the abdominal pain is 5/10. The abdominal pain is relieved by nothing. The abdominal pain is exacerbated by movement.  The patient states that she believes she is currently not pregnant. The patient has not had a change in bowel habit. Risk factors for an acute abdominal problem include a history of abdominal surgery (Patient has had perforated diverticulitis in the past with resultant colostomy.). Symptoms associated with the illness do not include constipation or back pain. Significant associated medical issues include diverticulitis.    Past Medical History  Diagnosis Date  . GERD (gastroesophageal reflux disease)   . Diverticulitis 2008      perforated/ requiring resection  . Colostomy in place   . Bipolar affective   . Depression   . Anxiety     Past Surgical History  Procedure Date  . Exploratory laparotomy with resection 2008  . Colostomy reversal 11/2006  . Abdominal wall hernia 07/2007    open repair with lysis of adhesions  . Hx of abd wall seroma 08/2007    Drained via [place] in [place], [place]  . Hx of abd wall seroma 10/2007    Drained by Dr. Leticia Penna in office  . Tonsillectomy   . Kidney stones   . Hernia repair     History reviewed. No pertinent family history.  History  Substance Use Topics  . Smoking status: Current Everyday Smoker -- 1.0 packs/day  . Smokeless tobacco: Not on file  . Alcohol Use: Yes     occationlly    OB History    Grav Para Term Preterm Abortions TAB SAB Ect Mult Living                  Review of Systems  Constitutional: Negative.  Negative for fever.  HENT: Negative.   Eyes: Negative.   Respiratory: Negative.   Cardiovascular: Negative.   Gastrointestinal: Positive for abdominal pain. Negative for nausea, vomiting, diarrhea, constipation and blood in stool.  Genitourinary: Negative.  Negative for dysuria and vaginal discharge.  Musculoskeletal: Negative.  Negative for back pain.  Skin: Negative.   Neurological: Negative.   Hematological: Negative.   Psychiatric/Behavioral: Negative.   All other systems reviewed and are negative.    Physical Exam  BP  148/97  Pulse 110  Temp(Src) 97.5 F (36.4 C) (Oral)  Resp 16  Ht 5\' 8"  (1.727 m)  Wt 263 lb (119.296 kg)  BMI 39.99 kg/m2  SpO2 99%  Physical Exam  Nursing note and vitals reviewed. Constitutional: She is oriented to person, place, and time. She appears well-developed and well-nourished. No distress.       Obese  HENT:  Head: Normocephalic and atraumatic.  Eyes: Conjunctivae and EOM are normal. Pupils are equal, round, and reactive to light.  Neck: Normal range of motion.  Cardiovascular: Normal rate,  regular rhythm, normal heart sounds and intact distal pulses.  Exam reveals no gallop and no friction rub.   No murmur heard. Pulmonary/Chest: Effort normal and breath sounds normal. No respiratory distress. She has no wheezes. She has no rales.  Abdominal: Soft. Bowel sounds are normal. She exhibits no distension. There is tenderness in the epigastric area, periumbilical area and suprapubic area. There is no rigidity, no rebound, no guarding and no CVA tenderness. No hernia.         No hernia is appreciated along scar on Valsalva maneuver.  Musculoskeletal: Normal range of motion. She exhibits no edema and no tenderness.  Neurological: She is alert and oriented to person, place, and time. No cranial nerve deficit. She exhibits normal muscle tone. Coordination normal.  Skin: Skin is warm and dry. No rash noted.  Psychiatric: Her affect is blunt.    ED Course  Procedures  MDM Patient was evaluated by myself and was hemodynamically stable. The patient was tachycardic at triage she was not tachycardic on my evaluation. Patient had nonacute abdomen. Given her history of abdominal surgery an abdominal series was performed to evaluate for obstruction. This was negative. Patient had lab workup which was unremarkable as well. This was evaluated for any abnormalities of liver function, pancreatic function, or urinary tract infection. Patient did not want pain medication as she was driving home today. Patient did have a CT performed as well as she stated that in the past when she had surgical issues she had a relatively benign presentation. CAT scan was also unremarkable aside from noted repeat finding of a right adnexal mass/cyst. Patient was told that she would need to have an ultrasound performed. She can arrange this through her primary care physician when she sees him on the 27th. Patient definitely has an appointment at that time and was comfortable with this. Patient did request some at home pain  medication and was comfortable with prescribing her with 15 tabs of Vicodin. Patient was comfortable with plan for discharge home. Again I did see the patient prior to discharge. She was no longer tachycardic and continued to be stable without acute abdomen.  Patient was discharged home in good condition.  Assessment: 35 year-old female with history of abdominal surgery who presents today with abdominal pain of unclear etiology.  Plan: As per MDM above.      Cyndra Numbers, MD 10/12/10 [phone]

## 2011-04-06 ENCOUNTER — Other Ambulatory Visit: Payer: Self-pay | Admitting: Adult Health

## 2011-04-06 ENCOUNTER — Other Ambulatory Visit (HOSPITAL_COMMUNITY)
Admission: RE | Admit: 2011-04-06 | Discharge: 2011-04-06 | Disposition: A | Discharge status: Home / Self Care | Payer: Medicaid Other | Source: Ambulatory Visit | Attending: Obstetrics and Gynecology | Admitting: Obstetrics and Gynecology

## 2011-04-06 DIAGNOSIS — Z01419 Encounter for gynecological examination (general) (routine) without abnormal findings: Secondary | ICD-10-CM | POA: Insufficient documentation

## 2011-04-16 ENCOUNTER — Ambulatory Visit (HOSPITAL_COMMUNITY)
Admission: RE | Admit: 2011-04-16 | Discharge: 2011-04-16 | Disposition: A | Discharge status: Home / Self Care | Payer: Medicaid Other | Source: Ambulatory Visit | Attending: Internal Medicine | Admitting: Internal Medicine

## 2011-04-16 ENCOUNTER — Other Ambulatory Visit (HOSPITAL_COMMUNITY): Payer: Self-pay | Admitting: Internal Medicine

## 2011-04-16 DIAGNOSIS — R05 Cough: Secondary | ICD-10-CM | POA: Insufficient documentation

## 2011-04-16 DIAGNOSIS — J069 Acute upper respiratory infection, unspecified: Secondary | ICD-10-CM

## 2011-04-16 DIAGNOSIS — R0602 Shortness of breath: Secondary | ICD-10-CM | POA: Insufficient documentation

## 2011-04-16 DIAGNOSIS — R059 Cough, unspecified: Secondary | ICD-10-CM

## 2011-05-13 ENCOUNTER — Other Ambulatory Visit (HOSPITAL_COMMUNITY): Payer: Self-pay | Admitting: Internal Medicine

## 2011-05-13 ENCOUNTER — Ambulatory Visit (HOSPITAL_COMMUNITY)
Admission: RE | Admit: 2011-05-13 | Discharge: 2011-05-13 | Disposition: A | Discharge status: Home / Self Care | Payer: Medicaid Other | Source: Ambulatory Visit | Attending: Internal Medicine | Admitting: Internal Medicine

## 2011-05-13 DIAGNOSIS — R059 Cough, unspecified: Secondary | ICD-10-CM

## 2011-05-13 DIAGNOSIS — J069 Acute upper respiratory infection, unspecified: Secondary | ICD-10-CM | POA: Insufficient documentation

## 2011-05-13 DIAGNOSIS — R509 Fever, unspecified: Secondary | ICD-10-CM | POA: Insufficient documentation

## 2011-05-13 DIAGNOSIS — R05 Cough: Secondary | ICD-10-CM

## 2011-05-13 DIAGNOSIS — R0989 Other specified symptoms and signs involving the circulatory and respiratory systems: Secondary | ICD-10-CM | POA: Insufficient documentation

## 2011-06-03 ENCOUNTER — Institutional Professional Consult (permissible substitution): Payer: Medicaid Other | Admitting: Internal Medicine

## 2011-07-31 ENCOUNTER — Other Ambulatory Visit (HOSPITAL_COMMUNITY): Payer: Self-pay | Admitting: Internal Medicine

## 2011-07-31 DIAGNOSIS — K219 Gastro-esophageal reflux disease without esophagitis: Secondary | ICD-10-CM

## 2011-07-31 DIAGNOSIS — R1312 Dysphagia, oropharyngeal phase: Secondary | ICD-10-CM

## 2011-08-03 ENCOUNTER — Ambulatory Visit (HOSPITAL_COMMUNITY)
Admission: RE | Admit: 2011-08-03 | Discharge: 2011-08-03 | Disposition: A | Discharge status: Home / Self Care | Payer: Medicaid Other | Source: Ambulatory Visit | Attending: Internal Medicine | Admitting: Internal Medicine

## 2011-08-03 DIAGNOSIS — R1310 Dysphagia, unspecified: Secondary | ICD-10-CM | POA: Insufficient documentation

## 2011-08-03 DIAGNOSIS — R1312 Dysphagia, oropharyngeal phase: Secondary | ICD-10-CM

## 2011-08-03 DIAGNOSIS — K219 Gastro-esophageal reflux disease without esophagitis: Secondary | ICD-10-CM

## 2011-08-03 DIAGNOSIS — R490 Dysphonia: Secondary | ICD-10-CM | POA: Insufficient documentation

## 2011-09-11 ENCOUNTER — Other Ambulatory Visit (HOSPITAL_COMMUNITY): Payer: Self-pay | Admitting: Family Medicine

## 2011-09-11 ENCOUNTER — Ambulatory Visit (HOSPITAL_COMMUNITY)
Admission: RE | Admit: 2011-09-11 | Discharge: 2011-09-11 | Disposition: A | Discharge status: Home / Self Care | Payer: Medicaid Other | Source: Ambulatory Visit | Attending: Family Medicine | Admitting: Family Medicine

## 2011-09-11 DIAGNOSIS — J209 Acute bronchitis, unspecified: Secondary | ICD-10-CM

## 2011-09-11 DIAGNOSIS — R0989 Other specified symptoms and signs involving the circulatory and respiratory systems: Secondary | ICD-10-CM | POA: Insufficient documentation

## 2011-09-11 DIAGNOSIS — R062 Wheezing: Secondary | ICD-10-CM | POA: Insufficient documentation

## 2011-09-11 DIAGNOSIS — R0602 Shortness of breath: Secondary | ICD-10-CM | POA: Insufficient documentation

## 2011-09-11 DIAGNOSIS — F17200 Nicotine dependence, unspecified, uncomplicated: Secondary | ICD-10-CM | POA: Insufficient documentation

## 2011-09-11 DIAGNOSIS — R05 Cough: Secondary | ICD-10-CM | POA: Insufficient documentation

## 2011-09-11 DIAGNOSIS — R059 Cough, unspecified: Secondary | ICD-10-CM | POA: Insufficient documentation

## 2011-09-16 ENCOUNTER — Encounter: Payer: Self-pay | Admitting: *Deleted

## 2011-09-17 ENCOUNTER — Ambulatory Visit (INDEPENDENT_AMBULATORY_CARE_PROVIDER_SITE_OTHER): Payer: Medicaid Other | Admitting: Internal Medicine

## 2011-09-17 ENCOUNTER — Encounter: Payer: Self-pay | Admitting: Internal Medicine

## 2011-09-17 VITALS — BP 124/70 | HR 96 | Temp 97.6°F | Ht 68.0 in | Wt 296.8 lb

## 2011-09-17 DIAGNOSIS — R05 Cough: Secondary | ICD-10-CM

## 2011-09-17 DIAGNOSIS — R059 Cough, unspecified: Secondary | ICD-10-CM

## 2011-09-17 DIAGNOSIS — F17200 Nicotine dependence, unspecified, uncomplicated: Secondary | ICD-10-CM

## 2011-09-17 MED ORDER — NEBIVOLOL HCL 10 MG PO TABS: 10.0000 mg | ORAL_TABLET | Freq: Every day | ORAL | Status: DC

## 2011-09-17 MED ORDER — FAMOTIDINE 20 MG PO TABS: ORAL_TABLET | ORAL | Status: DC

## 2011-09-17 NOTE — Patient Instructions (Addendum)
Stop pindolol  Start bystolic 10 mg one daily in it's place  Only use your albuterol as a rescue medication to be used if you can't catch your breath by resting or doing a relaxed purse lip breathing pattern. The less you use it, the better it will work when you need it.   Change protonix   Take 30-60 min before first meal of the day and Pepcid 20 mg one bedtime   The key is to stop smoking completely before smoking completely stops you!   Please schedule a follow up office visit in 4 weeks, sooner if needed with pft's

## 2011-09-17 NOTE — Progress Notes (Signed)
  Subjective:    Patient ID: [name], female    DOB: [birth year]  MRN: [id number]  HPI  82 yowf active smoker with new onset cough and wheezing in 2012 waxing and waning and referred by [facility] 09/17/2011 to pulmonary clinic  09/17/2011 1st pulmonary cc persistent cough and wheezing for about a year first episode occurred acutely and  seemed better p prednisone and abx and then worse again w/in one week rx with cough medication and zpak and prednisone and nebulizer > baseline sob walking x more than aisle at grocery store and trouble sleeping better in recliner. With exac started coughing with clear mucus but  No  , purulent sputum or sinus/hb symptoms on present rx.  Sleeping ok without nocturnal  or early am exacerbation  of respiratory  c/o's or need for noct saba. Also denies any obvious fluctuation of symptoms with weather or environmental changes or other aggravating or alleviating factors except as outlined above      Review of Systems  Constitutional: Negative for fever, chills and unexpected weight change.  HENT: Negative for ear pain, nosebleeds, congestion, sore throat, rhinorrhea, sneezing, trouble swallowing, dental problem, voice change, postnasal drip and sinus pressure.   Eyes: Negative for visual disturbance.  Respiratory: Positive for cough and shortness of breath. Negative for choking.   Cardiovascular: Positive for leg swelling. Negative for chest pain.  Gastrointestinal: Negative for vomiting, abdominal pain and diarrhea.  Genitourinary: Negative for difficulty urinating.  Musculoskeletal: Negative for arthralgias.  Skin: Negative for rash.  Neurological: Negative for tremors, syncope and headaches.  Hematological: Does not bruise/bleed easily.       Objective:   Physical Exam amb obese wf with jaw tremor and classic pseudowheeze resolves with plm Wt 09/17/2011  296  HEENT: nl dentition, turbinates, and orophanx. Nl external ear canals without cough  reflex   NECK :  without JVD/Nodes/TM/ nl carotid upstrokes bilaterally   LUNGS: no acc muscle use, clear to A and P bilaterally without cough on insp or exp maneuvers   CV:  RRR  no s3 or murmur or increase in P2, no edema   ABD:  soft and nontender with nl excursion in the supine position. No bruits or organomegaly, bowel sounds nl  MS:  warm without deformities, calf tenderness, cyanosis or clubbing  SKIN: warm and dry without lesions    NEURO:  alert, approp, no deficits. Jaw tremor noted, no obvious muscle regidity   09/11/2011 Decreased lung volumes         Assessment & Plan:

## 2011-09-19 DIAGNOSIS — F17200 Nicotine dependence, unspecified, uncomplicated: Secondary | ICD-10-CM | POA: Insufficient documentation

## 2011-09-19 DIAGNOSIS — R059 Cough, unspecified: Secondary | ICD-10-CM | POA: Insufficient documentation

## 2011-09-19 DIAGNOSIS — R05 Cough: Secondary | ICD-10-CM | POA: Insufficient documentation

## 2011-09-19 NOTE — Assessment & Plan Note (Signed)
The key is to stop smoking completely before smoking completely stops you!            

## 2011-09-19 NOTE — Assessment & Plan Note (Addendum)
The most common causes of chronic cough in immunocompetent adults include the following: upper airway cough syndrome (UACS), previously referred to as postnasal drip syndrome (PNDS), which is caused by variety of rhinosinus conditions; (2) asthma; (3) GERD; (4) chronic bronchitis from cigarette smoking or other inhaled environmental irritants; (5) nonasthmatic eosinophilic bronchitis; and (6) bronchiectasis.   These conditions, singly or in combination, have accounted for up to 94% of the causes of chronic cough in prospective studies.   Other conditions have constituted no >6% of the causes in prospective studies These have included bronchogenic carcinoma, chronic interstitial pneumonia, sarcoidosis, left ventricular failure, ACEI-induced cough, and aspiration from a condition associated with pharyngeal dysfunction.   This is likely either cough variant asthma or  Classic Upper airway cough syndrome, so named because it's frequently impossible to sort out how much is  CR/sinusitis with freq throat clearing (which can be related to primary GERD)   vs  causing  secondary (" extra esophageal")  GERD from wide swings in gastric pressure that occur with throat clearing, often  promoting self use of mint and menthol lozenges that reduce the lower esophageal sphincter tone and exacerbate the problem further in a cyclical fashion.   These are the same pts (now being labeled as having "irritable larynx syndrome" by some cough centers) who not infrequently have a history of having failed to tolerate ace inhibitors,  dry powder inhalers or biphosphonates or report having atypical reflux symptoms that don't respond to standard doses of PPI , and are easily confused as having aecopd or asthma flares by even experienced allergists/ pulmonologists.   For now would like  To try the simple step of changing to  : Bystolic, the most beta -1  selective Beta blocker available in sample form, with bisoprolol the most selective  generic choice  on the market and rx for GERD before bringing back for baseline pft's and go from there.

## 2011-10-27 ENCOUNTER — Emergency Department (HOSPITAL_COMMUNITY)
Admission: EM | Admit: 2011-10-27 | Discharge: 2011-10-27 | Disposition: A | Discharge status: Home / Self Care | Payer: Medicaid Other | Attending: Emergency Medicine | Admitting: Emergency Medicine

## 2011-10-27 ENCOUNTER — Emergency Department (HOSPITAL_COMMUNITY): Payer: Medicaid Other

## 2011-10-27 ENCOUNTER — Encounter (HOSPITAL_COMMUNITY): Payer: Self-pay | Admitting: *Deleted

## 2011-10-27 DIAGNOSIS — Y92410 Unspecified street and highway as the place of occurrence of the external cause: Secondary | ICD-10-CM | POA: Insufficient documentation

## 2011-10-27 DIAGNOSIS — T148XXA Other injury of unspecified body region, initial encounter: Secondary | ICD-10-CM

## 2011-10-27 DIAGNOSIS — F319 Bipolar disorder, unspecified: Secondary | ICD-10-CM | POA: Insufficient documentation

## 2011-10-27 DIAGNOSIS — Z23 Encounter for immunization: Secondary | ICD-10-CM | POA: Insufficient documentation

## 2011-10-27 DIAGNOSIS — R109 Unspecified abdominal pain: Secondary | ICD-10-CM | POA: Insufficient documentation

## 2011-10-27 DIAGNOSIS — Z79899 Other long term (current) drug therapy: Secondary | ICD-10-CM | POA: Insufficient documentation

## 2011-10-27 DIAGNOSIS — F17200 Nicotine dependence, unspecified, uncomplicated: Secondary | ICD-10-CM | POA: Insufficient documentation

## 2011-10-27 MED ORDER — TETANUS-DIPHTH-ACELL PERTUSSIS 5-2.5-18.5 LF-MCG/0.5 IM SUSP
0.5000 mL | Freq: Once | INTRAMUSCULAR | Status: AC
  Administered 2011-10-27: 0.5 mL via INTRAMUSCULAR
  Filled 2011-10-27: qty 0.5

## 2011-10-27 MED ORDER — BACITRACIN-NEOMYCIN-POLYMYXIN 400-5-5000 EX OINT
TOPICAL_OINTMENT | Freq: Once | CUTANEOUS | Status: AC
  Administered 2011-10-27: 1 via TOPICAL
  Filled 2011-10-27: qty 1

## 2011-10-27 MED ORDER — IBUPROFEN 800 MG PO TABS: 800.0000 mg | ORAL_TABLET | Freq: Once | ORAL | Status: DC

## 2011-10-27 MED ORDER — IBUPROFEN 600 MG PO TABS: 600.0000 mg | ORAL_TABLET | Freq: Four times a day (QID) | ORAL | Status: AC | PRN

## 2011-10-27 NOTE — ED Notes (Signed)
Pt. Refuses Ibuprofen.  Pt. Requesting Percocet.  EDP notified.
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
## 2011-11-09 ENCOUNTER — Ambulatory Visit (INDEPENDENT_AMBULATORY_CARE_PROVIDER_SITE_OTHER): Payer: Medicaid Other | Admitting: Internal Medicine

## 2011-11-09 ENCOUNTER — Encounter: Payer: Self-pay | Admitting: Internal Medicine

## 2011-11-09 VITALS — BP 128/88 | HR 114 | Temp 98.0°F | Ht 65.0 in | Wt 280.0 lb

## 2011-11-09 DIAGNOSIS — K219 Gastro-esophageal reflux disease without esophagitis: Secondary | ICD-10-CM

## 2011-11-09 DIAGNOSIS — F17200 Nicotine dependence, unspecified, uncomplicated: Secondary | ICD-10-CM

## 2011-11-09 DIAGNOSIS — R05 Cough: Secondary | ICD-10-CM

## 2011-11-09 DIAGNOSIS — R059 Cough, unspecified: Secondary | ICD-10-CM

## 2011-11-09 DIAGNOSIS — Z23 Encounter for immunization: Secondary | ICD-10-CM

## 2011-11-09 LAB — PULMONARY FUNCTION TEST

## 2011-11-09 MED ORDER — ALBUTEROL SULFATE HFA 108 (90 BASE) MCG/ACT IN AERS: 2.0000 | INHALATION_SPRAY | Freq: Four times a day (QID) | RESPIRATORY_TRACT | Status: DC | PRN

## 2011-11-09 NOTE — Progress Notes (Signed)
PFT done today. 

## 2011-11-09 NOTE — Patient Instructions (Addendum)
Only use your albuterol(proaire) as a rescue medication to be used if you can't catch your breath by resting or doing a relaxed purse lip breathing pattern. The less you use it, the better it will work when you need it.     The key is to stop smoking completely before smoking completely stops you - this is the most important aspect of your care.  If your breathing worsens or you need to use your rescue inhaler(proaire)  more than twice weekly or wake up more than twice a month with any respiratory symptoms or require more than two rescue inhalers per year, we need to see you right away because this means we're not controlling the underlying problem (inflammation) adequately.  Rescue inhalers do not control inflammation and overuse can lead to unnecessary and costly consequences.  They can make you feel better temporarily but eventually they will quit working effectively much as sleep aids lead to more insomnia if used regularly.   GERD (REFLUX)  is an extremely common cause of respiratory symptoms(like wheezing when you lie down) , many times with no significant heartburn at all.    It can be treated with medication, but also with lifestyle changes including avoidance of late meals, excessive alcohol, smoking cessation, and avoid fatty foods, chocolate, peppermint, colas, red wine, and acidic juices such as orange juice.  NO MINT OR MENTHOL PRODUCTS SO NO COUGH DROPS  USE SUGARLESS CANDY INSTEAD (jolley ranchers or Stover's)  NO OIL BASED VITAMINS - use powdered substitutes.    Ok to stay off all beta blockers for now but if you need one I would strongly prefer bisoprolol   If you are satisfied with your treatment plan let your doctor know and he/she can either refill your medications or you can return here when your prescription runs out.     If in any way you are not 100% satisfied,  please tell us.  If 100% better, tell your friends!

## 2011-11-09 NOTE — Progress Notes (Signed)
Subjective:    Patient ID: [name], female    DOB: 1975-10-05  MRN: [id number]  HPI  37 yowf active smoker with new onset cough and wheezing in 2012 waxing and waning and referred by [facility] 09/17/2011 to pulmonary clinic with completely nl pft's off saba 11/09/11  09/17/2011 1st pulmonary cc persistent cough and wheezing for about a year first episode occurred acutely and  seemed better p prednisone and abx and then worse again w/in one week rx with cough medication and zpak and prednisone and nebulizer > baseline sob walking x more than aisle at grocery store and trouble sleeping better in recliner. With exac started coughing with clear mucus but  No  , purulent sputum or sinus/hb symptoms on present rx. rec Stop pindolol Start bystolic 10 mg one daily in it's place Only use your albuterol as a rescue medication to be used if you can't catch your breath by resting or doing a relaxed purse lip breathing pattern. The less you use it, the better it will work when you need it.  Change protonix   Take 30-60 min before first meal of the day and Pepcid 20 mg one bedtime  The key is to stop smoking completely before smoking completely stops you!  Please schedule a follow up office visit in 4 weeks, sooner if needed with pft's     11/09/2011 f/u ov/[name] still smoking not using bystolic x one month,  Using inhalers randomly "because people tell me I'm wheezing" esp at hs but none on day of ov with perfectly normal pfts.  No obvious daytime variabilty or assoc chronic cough or cp or chest tightness, subjective wheeze overt sinus or hb symptoms. No unusual exp hx or h/o childhood pna/ asthma or premature birth to his knowledge.          Sleeping ok without nocturnal  or early am exacerbation  of respiratory  c/o's or need for noct saba. Also denies any obvious fluctuation of symptoms with weather or environmental changes or other aggravating or alleviating factors except as outlined above   ROS   The following are not active complaints unless bolded sore throat, dysphagia, dental problems, itching, sneezing,  nasal congestion or excess/ purulent secretions, ear ache,   fever, chills, sweats, unintended wt loss, pleuritic or exertional cp, hemoptysis,  orthopnea pnd or leg swelling, presyncope, palpitations, heartburn, abdominal pain, anorexia, nausea, vomiting, diarrhea  or change in bowel or urinary habits, change in stools or urine, dysuria,hematuria,  rash, arthralgias, visual complaints, headache, numbness weakness or ataxia or problems with walking or coordination,  change in mood/affect or memory.               Objective:   Physical Exam amb obese wf with jaw tremor and classic pseudowheeze resolves with plm Wt 09/17/2011  296 > 11/09/2011  280 HEENT: nl dentition, turbinates, and orophanx. Nl external ear canals without cough reflex   NECK :  without JVD/Nodes/TM/ nl carotid upstrokes bilaterally   LUNGS: no acc muscle use, clear to A and P bilaterally without cough on insp or exp maneuvers   CV:  RRR  no s3 or murmur or increase in P2, no edema   ABD:  soft and nontender with nl excursion in the supine position. No bruits or organomegaly, bowel sounds nl  MS:  warm without deformities, calf tenderness, cyanosis or clubbing  SKIN: warm and dry without lesions    NEURO:  alert, approp, no deficits. Jaw tremor noted, no  obvious muscle regidity   09/11/2011 Decreased lung volumes         Assessment & Plan:

## 2011-11-10 NOTE — Assessment & Plan Note (Signed)
-   resolved to her satisfaction on gerd rx / still smoking 11/09/11

## 2011-11-10 NOTE — Assessment & Plan Note (Signed)
Some of her noct "wheezing" may well be gerd related, added pepcid at hs plus reviewed diet restrictions and need to quit smoking

## 2011-11-10 NOTE — Assessment & Plan Note (Signed)
-   limits of effective care reviewed 09/17/11 - PFT's 11/09/2011 nl x erv 30% - HFA 75% 11/09/2011   > 3 min discussion I reviewed the Flethcher curve with patient that basically indicates  if you quit smoking when your best day FEV1 is still well preserved it is highly unlikely you will progress to severe disease and informed the patient there was no medication on the market that has proven to change the curve or the likelihood of progression.  Therefore stopping smoking and maintaining abstinence is the most important aspect of care, not choice of inhalers or for that matter, doctors.

## 2011-11-12 ENCOUNTER — Encounter: Payer: Self-pay | Admitting: Internal Medicine

## 2012-04-05 ENCOUNTER — Other Ambulatory Visit (HOSPITAL_COMMUNITY): Payer: Self-pay | Admitting: General Surgery

## 2012-04-05 DIAGNOSIS — K603 Anal fistula: Secondary | ICD-10-CM

## 2012-04-11 ENCOUNTER — Other Ambulatory Visit (HOSPITAL_COMMUNITY): Payer: Self-pay | Admitting: General Surgery

## 2012-04-11 ENCOUNTER — Ambulatory Visit (HOSPITAL_COMMUNITY)
Admission: RE | Admit: 2012-04-11 | Discharge: 2012-04-11 | Disposition: A | Discharge status: Home / Self Care | Payer: Medicaid Other | Source: Ambulatory Visit | Attending: General Surgery | Admitting: General Surgery

## 2012-04-11 DIAGNOSIS — K603 Anal fistula, unspecified: Secondary | ICD-10-CM | POA: Insufficient documentation

## 2012-04-11 MED ORDER — IOHEXOL 300 MG/ML  SOLN
100.0000 mL | Freq: Once | INTRAMUSCULAR | Status: AC | PRN
  Administered 2012-04-11: 100 mL via INTRAVENOUS

## 2012-04-15 ENCOUNTER — Encounter (HOSPITAL_COMMUNITY): Payer: Self-pay | Admitting: Pharmacy Technician

## 2012-04-20 ENCOUNTER — Encounter (HOSPITAL_COMMUNITY): Admission: RE | Admit: 2012-04-20 | Payer: Medicaid Other | Source: Ambulatory Visit

## 2012-04-22 ENCOUNTER — Other Ambulatory Visit (HOSPITAL_COMMUNITY): Payer: Medicaid Other

## 2012-04-29 ENCOUNTER — Encounter (HOSPITAL_COMMUNITY): Admission: RE | Admit: 2012-04-29 | Payer: Medicaid Other | Source: Ambulatory Visit

## 2012-05-04 NOTE — Patient Instructions (Addendum)
Your procedure is scheduled on: 05/11/2012  Report to [facility] at 6:15    AM.  Call this number if you have problems the morning of surgery: [phone]   Remember:   Do not drink or eat food:After Midnight.  :  Take these medicines the morning of surgery with A SIP OF WATER: Klonopin, lithium, seroquel, protonix and use albuterol inhaler   Do not wear jewelry, make-up or nail polish.  Do not wear lotions, powders, or perfumes. You may wear deodorant.  Do not shave 48 hours prior to surgery. Men may shave face and neck.  Do not bring valuables to the hospital.  Contacts, dentures or bridgework may not be worn into surgery.  Leave suitcase in the car. After surgery it may be brought to your room.  For patients admitted to the hospital, checkout time is 11:00 AM the day of discharge.   Patients discharged the day of surgery will not be allowed to drive home.    Special Instructions: Shower using CHG 2 nights before surgery and the night before surgery.  If you shower the day of surgery use CHG.  Use special wash - you have one bottle of CHG for all showers.  You should use approximately 1/3 of the bottle for each shower.   Please read over the following fact sheets that you were given: Pain Booklet, MRSA Information, Surgical Site Infection Prevention and Care and Recovery After Surgery  Anal Fistula An anal fistula is an abnormal tunnel that leads from the anal canal (which carries stool from the large intestine) to a hole in the skin near the anus (the opening through which stool passes out of your body).  CAUSES  Food you eat goes from your stomach into your intestine. As the food is digested, waste material (stool) forms. Stool passes through your large intestine, through the rectum and anal canal, and out of your body through the anus.  The anus has a number of tiny glands (clusters of specialized cells) that make lubricating fluid. Sometimes these glands can become infected. This type  of infection may lead to the development of a pocket of pus (abscess). An anal fistula often develops after an infection or abscess; It is nearly always caused by a past anorectal abscess. You are at a higher risk of developing an anal fistula if you have:  Had an anal abscess.  Chronic inflammatory bowel disease, such as Crohn's disease or ulcerative colitis.  Conditions in which there are inflamed outpouchings of the intestinal wall (diverticulitis).  Colon or rectal cancer.  Sexually transmitted diseases involving the rectum, such as gonorrhea or chlamydia.  A history of anal radiation treatments, injury, or surgery.  An HIV infection.  A problem that has required treatment with steroid medicines for more than a short time. SYMPTOMS   Anal pain, particularly around the area of a past abscess.  Drainage of pus, blood, stool or mucus from an opening in the skin.  Swelling around the skin opening.  Worn off skin around the opening.  A hot or red area near the anus.  Diarrhea.  Fever and chills.  Tiredness (fatigue). DIAGNOSIS   In some cases, the opening of an anal fistula is easily seen during a physical exam.  A probe or scope may be used to help locate the opening of the fistula. In some cases, dye can be injected into the fistula opening, and X-rays can be taken to find the exact location and path of the  fistula.  A sample (biopsy) of the fistula tissue or anus may be taken to check for cancer. TREATMENT   An anal fistula may need surgery to open it up and allow it to heal. This type of operation is called a fistulotomy.  A specialized kind of glue or plug to seal the fistula may be used.  An antibiotic may be prescribed to treat an existing infection. HOME CARE INSTRUCTIONS   Take medications (such as antibiotics) as prescribed by your caregiver.  Only take over-the-counter or prescription medicine for pain, discomfort, or fever as directed by your  caregiver.  Follow your prescribed diet. You may need a higher fiber diet to help avoid constipation.  Drink lots of water as directed.  Use a stool softener or laxative, if recommended.  A warm sitz bath several times a day may be soothing, as well as help with healing.  Follow excellent hygiene to keep the anal area as clean as possible. Consider using pre-moistened towelettes to keep the anal area clean after using the bathroom. SEEK MEDICAL CARE IF:  You have increased pain not controlled with medications.  You notice new swelling, redness, or hotness in the anal area.  You develop any problems passing urine.  You develop a fever (more than 100.5 F (38.1 C). SEEK IMMEDIATE MEDICAL CARE IF:  You have severe, intolerable pain.  You have severe problems passing urine or cannot pass any urine at all.  You develop an unexplained oral temperature above 102.0 F (38.9 C).  You notice new or worsening leakage of blood, pus, mucus, or stool. Document Released: 01/23/2008 Document Revised: 05/04/2011 Document Reviewed: 01/23/2008 [facility] Patient Information 2013 [place], [place]. PATIENT INSTRUCTIONS POST-ANESTHESIA  IMMEDIATELY FOLLOWING SURGERY:  Do not drive or operate machinery for the first twenty four hours after surgery.  Do not make any important decisions for twenty four hours after surgery or while taking narcotic pain medications or sedatives.  If you develop intractable nausea and vomiting or a severe headache please notify your doctor immediately.  FOLLOW-UP:  Please make an appointment with your surgeon as instructed. You do not need to follow up with anesthesia unless specifically instructed to do so.  WOUND CARE INSTRUCTIONS (if applicable):  Keep a dry clean dressing on the anesthesia/puncture wound site if there is drainage.  Once the wound has quit draining you may leave it open to air.  Generally you should leave the bandage intact for twenty four hours unless  there is drainage.  If the epidural site drains for more than 36-48 hours please call the anesthesia department.  QUESTIONS?:  Please feel free to call your physician or the hospital operator if you have any questions, and they will be happy to assist you.

## 2012-05-05 ENCOUNTER — Other Ambulatory Visit: Payer: Self-pay

## 2012-05-05 ENCOUNTER — Encounter (HOSPITAL_COMMUNITY): Payer: Self-pay

## 2012-05-05 ENCOUNTER — Encounter (HOSPITAL_COMMUNITY)
Admission: RE | Admit: 2012-05-05 | Discharge: 2012-05-05 | Disposition: A | Discharge status: Home / Self Care | Payer: Medicaid Other | Source: Ambulatory Visit | Attending: General Surgery | Admitting: General Surgery

## 2012-05-05 HISTORY — DX: Unspecified asthma, uncomplicated: J45.909

## 2012-05-05 HISTORY — DX: Unspecified osteoarthritis, unspecified site: M19.90

## 2012-05-05 LAB — BASIC METABOLIC PANEL
BUN: 11 mg/dL (ref 6–23)
CO2: 26 mEq/L (ref 19–32)
Calcium: 9.2 mg/dL (ref 8.4–10.5)
Chloride: 102 mEq/L (ref 96–112)
Creatinine, Ser: 0.67 mg/dL (ref 0.50–1.10)
GFR calc Af Amer: >90 mL/min (ref >90)
GFR calc non Af Amer: >90 mL/min (ref >90)
Glucose, Bld: 106 mg/dL — ABNORMAL HIGH (ref 70–99)
Potassium: 3.8 mEq/L (ref 3.5–5.1)
Sodium: 139 mEq/L (ref 135–145)

## 2012-05-05 LAB — CBC WITH DIFFERENTIAL/PLATELET
Basophils Absolute: 0 10*3/uL (ref 0.0–0.1)
Basophils Relative: 0 % (ref 0–1)
Eosinophils Absolute: 0.3 10*3/uL (ref 0.0–0.7)
Eosinophils Relative: 4 % (ref 0–5)
HCT: 40.2 % (ref 36.0–46.0)
Hemoglobin: 13.3 g/dL (ref 12.0–15.0)
Lymphocytes Relative: 40 % (ref 12–46)
Lymphs Abs: 2.7 10*3/uL (ref 0.7–4.0)
MCH: 28.2 pg (ref 26.0–34.0)
MCHC: 33.1 g/dL (ref 30.0–36.0)
MCV: 85.2 fL (ref 78.0–100.0)
Monocytes Absolute: 0.5 10*3/uL (ref 0.1–1.0)
Monocytes Relative: 8 % (ref 3–12)
Neutro Abs: 3.2 10*3/uL (ref 1.7–7.7)
Neutrophils Relative %: 48 % (ref 43–77)
Platelets: 243 10*3/uL (ref 150–400)
RBC: 4.72 MIL/uL (ref 3.87–5.11)
RDW: 13.4 % (ref 11.5–15.5)
WBC: 6.6 10*3/uL (ref 4.0–10.5)

## 2012-05-05 LAB — HCG, QUANTITATIVE, PREGNANCY: hCG, Beta Chain, Quant, S: <1 m[IU]/mL (ref <5)

## 2012-05-05 LAB — SURGICAL PCR SCREEN
MRSA, PCR: NEGATIVE
Staphylococcus aureus: NEGATIVE

## 2012-05-11 ENCOUNTER — Encounter (HOSPITAL_COMMUNITY): Payer: Self-pay | Admitting: Anesthesiology

## 2012-05-11 ENCOUNTER — Encounter (HOSPITAL_COMMUNITY): Payer: Self-pay | Admitting: *Deleted

## 2012-05-11 ENCOUNTER — Encounter (HOSPITAL_COMMUNITY)
Admission: RE | Disposition: A | Discharge status: Home / Self Care | Payer: Self-pay | Source: Ambulatory Visit | Attending: General Surgery

## 2012-05-11 ENCOUNTER — Ambulatory Visit (HOSPITAL_COMMUNITY)
Admission: RE | Admit: 2012-05-11 | Discharge: 2012-05-11 | Disposition: A | Discharge status: Home / Self Care | Payer: Medicaid Other | Source: Ambulatory Visit | Attending: General Surgery | Admitting: General Surgery

## 2012-05-11 ENCOUNTER — Ambulatory Visit (HOSPITAL_COMMUNITY): Payer: Medicaid Other | Admitting: Anesthesiology

## 2012-05-11 DIAGNOSIS — I10 Essential (primary) hypertension: Secondary | ICD-10-CM | POA: Insufficient documentation

## 2012-05-11 DIAGNOSIS — Z79899 Other long term (current) drug therapy: Secondary | ICD-10-CM | POA: Insufficient documentation

## 2012-05-11 DIAGNOSIS — K219 Gastro-esophageal reflux disease without esophagitis: Secondary | ICD-10-CM | POA: Insufficient documentation

## 2012-05-11 DIAGNOSIS — K603 Anal fistula, unspecified: Secondary | ICD-10-CM | POA: Insufficient documentation

## 2012-05-11 DIAGNOSIS — F17200 Nicotine dependence, unspecified, uncomplicated: Secondary | ICD-10-CM | POA: Insufficient documentation

## 2012-05-11 DIAGNOSIS — Z886 Allergy status to analgesic agent status: Secondary | ICD-10-CM | POA: Insufficient documentation

## 2012-05-11 DIAGNOSIS — F411 Generalized anxiety disorder: Secondary | ICD-10-CM | POA: Insufficient documentation

## 2012-05-11 DIAGNOSIS — Z6841 Body Mass Index (BMI) 40.0 and over, adult: Secondary | ICD-10-CM | POA: Insufficient documentation

## 2012-05-11 DIAGNOSIS — J45909 Unspecified asthma, uncomplicated: Secondary | ICD-10-CM | POA: Insufficient documentation

## 2012-05-11 DIAGNOSIS — F3289 Other specified depressive episodes: Secondary | ICD-10-CM | POA: Insufficient documentation

## 2012-05-11 DIAGNOSIS — Z21 Asymptomatic human immunodeficiency virus [HIV] infection status: Secondary | ICD-10-CM | POA: Insufficient documentation

## 2012-05-11 DIAGNOSIS — F329 Major depressive disorder, single episode, unspecified: Secondary | ICD-10-CM | POA: Insufficient documentation

## 2012-05-11 HISTORY — PX: PLACEMENT OF SETON: SHX6029

## 2012-05-11 HISTORY — PX: EXAMINATION UNDER ANESTHESIA: SHX1540

## 2012-05-11 SURGERY — FISTULOTOMY
Anesthesia: General | Wound class: Dirty or Infected

## 2012-05-11 MED ORDER — MIDAZOLAM HCL 2 MG/2ML IJ SOLN
INTRAMUSCULAR | Status: AC
  Filled 2012-05-11: qty 2

## 2012-05-11 MED ORDER — ONDANSETRON HCL 4 MG/2ML IJ SOLN: 4.0000 mg | Freq: Once | INTRAMUSCULAR | Status: DC | PRN

## 2012-05-11 MED ORDER — ROCURONIUM BROMIDE 100 MG/10ML IV SOLN
INTRAVENOUS | Status: DC | PRN
  Administered 2012-05-11 (×2): 5 mg via INTRAVENOUS
  Administered 2012-05-11: 20 mg via INTRAVENOUS
  Administered 2012-05-11: 10 mg via INTRAVENOUS

## 2012-05-11 MED ORDER — PROPOFOL 10 MG/ML IV BOLUS
INTRAVENOUS | Status: DC | PRN
  Administered 2012-05-11: 180 mg via INTRAVENOUS

## 2012-05-11 MED ORDER — ENOXAPARIN SODIUM 40 MG/0.4ML ~~LOC~~ SOLN
40.0000 mg | Freq: Once | SUBCUTANEOUS | Status: AC
  Administered 2012-05-11: 40 mg via SUBCUTANEOUS

## 2012-05-11 MED ORDER — CELECOXIB 100 MG PO CAPS: 400.0000 mg | ORAL_CAPSULE | Freq: Every day | ORAL | Status: DC

## 2012-05-11 MED ORDER — FENTANYL CITRATE 0.05 MG/ML IJ SOLN
INTRAMUSCULAR | Status: AC
  Filled 2012-05-11: qty 2

## 2012-05-11 MED ORDER — DEXTROSE 5 % IV SOLN
2.0000 g | INTRAVENOUS | Status: AC
  Administered 2012-05-11: 2 g via INTRAVENOUS

## 2012-05-11 MED ORDER — ENOXAPARIN SODIUM 40 MG/0.4ML ~~LOC~~ SOLN
SUBCUTANEOUS | Status: AC
  Filled 2012-05-11: qty 0.4

## 2012-05-11 MED ORDER — GLYCOPYRROLATE 0.2 MG/ML IJ SOLN: 0.2000 mg | Freq: Once | INTRAMUSCULAR | Status: DC

## 2012-05-11 MED ORDER — FENTANYL CITRATE 0.05 MG/ML IJ SOLN
INTRAMUSCULAR | Status: DC | PRN
  Administered 2012-05-11 (×7): 50 ug via INTRAVENOUS

## 2012-05-11 MED ORDER — LACTATED RINGERS IV SOLN
INTRAVENOUS | Status: DC
  Administered 2012-05-11: 07:00:00 via INTRAVENOUS

## 2012-05-11 MED ORDER — DEXTROSE 5 % IV SOLN
INTRAVENOUS | Status: AC
  Filled 2012-05-11: qty 2

## 2012-05-11 MED ORDER — LIDOCAINE HCL (CARDIAC) 20 MG/ML IV SOLN
INTRAVENOUS | Status: DC | PRN
  Administered 2012-05-11: 50 mg via INTRAVENOUS

## 2012-05-11 MED ORDER — GLYCOPYRROLATE 0.2 MG/ML IJ SOLN
INTRAMUSCULAR | Status: DC | PRN
  Administered 2012-05-11: 0.2 mg via INTRAVENOUS
  Administered 2012-05-11: 5 mg via INTRAVENOUS

## 2012-05-11 MED ORDER — OXYCODONE-ACETAMINOPHEN 7.5-325 MG PO TABS: 1.0000 | ORAL_TABLET | ORAL | Status: DC | PRN

## 2012-05-11 MED ORDER — BUPIVACAINE HCL (PF) 0.5 % IJ SOLN
INTRAMUSCULAR | Status: AC
  Filled 2012-05-11: qty 30

## 2012-05-11 MED ORDER — GLYCOPYRROLATE 0.2 MG/ML IJ SOLN
INTRAMUSCULAR | Status: AC
  Filled 2012-05-11: qty 3

## 2012-05-11 MED ORDER — SURGILUBE EX GEL
CUTANEOUS | Status: DC | PRN
  Administered 2012-05-11: 1 via TOPICAL

## 2012-05-11 MED ORDER — ROCURONIUM BROMIDE 50 MG/5ML IV SOLN
INTRAVENOUS | Status: AC
  Filled 2012-05-11: qty 1

## 2012-05-11 MED ORDER — FENTANYL CITRATE 0.05 MG/ML IJ SOLN
INTRAMUSCULAR | Status: AC
  Filled 2012-05-11: qty 5

## 2012-05-11 MED ORDER — LABETALOL HCL 5 MG/ML IV SOLN
INTRAVENOUS | Status: AC
  Filled 2012-05-11: qty 4

## 2012-05-11 MED ORDER — 0.9 % SODIUM CHLORIDE (POUR BTL) OPTIME
TOPICAL | Status: DC | PRN
  Administered 2012-05-11: 1000 mL

## 2012-05-11 MED ORDER — BUPIVACAINE HCL (PF) 0.5 % IJ SOLN
INTRAMUSCULAR | Status: DC | PRN
  Administered 2012-05-11: 10 mL

## 2012-05-11 MED ORDER — NEOSTIGMINE METHYLSULFATE 1 MG/ML IJ SOLN
INTRAMUSCULAR | Status: AC
  Filled 2012-05-11: qty 1

## 2012-05-11 MED ORDER — PROPOFOL 10 MG/ML IV EMUL
INTRAVENOUS | Status: AC
  Filled 2012-05-11: qty 20

## 2012-05-11 MED ORDER — SUCCINYLCHOLINE CHLORIDE 20 MG/ML IJ SOLN
INTRAMUSCULAR | Status: AC
  Filled 2012-05-11: qty 1

## 2012-05-11 MED ORDER — LABETALOL HCL 5 MG/ML IV SOLN
INTRAVENOUS | Status: DC | PRN
  Administered 2012-05-11 (×2): 5 mg via INTRAVENOUS

## 2012-05-11 MED ORDER — SUCCINYLCHOLINE CHLORIDE 20 MG/ML IJ SOLN
INTRAMUSCULAR | Status: DC | PRN
  Administered 2012-05-11: 120 mg via INTRAVENOUS

## 2012-05-11 MED ORDER — LACTATED RINGERS IV SOLN: INTRAVENOUS | Status: DC

## 2012-05-11 MED ORDER — LIDOCAINE HCL (PF) 1 % IJ SOLN
INTRAMUSCULAR | Status: AC
  Filled 2012-05-11: qty 5

## 2012-05-11 MED ORDER — MIDAZOLAM HCL 2 MG/2ML IJ SOLN
1.0000 mg | INTRAMUSCULAR | Status: DC | PRN
  Administered 2012-05-11: 2 mg via INTRAVENOUS

## 2012-05-11 MED ORDER — LIDOCAINE VISCOUS 2 % MT SOLN
OROMUCOSAL | Status: DC | PRN
  Administered 2012-05-11: 15 mL

## 2012-05-11 MED ORDER — NEOSTIGMINE METHYLSULFATE 1 MG/ML IJ SOLN
INTRAMUSCULAR | Status: DC | PRN
  Administered 2012-05-11: 3 mg via INTRAVENOUS

## 2012-05-11 MED ORDER — GLYCOPYRROLATE 0.2 MG/ML IJ SOLN
INTRAMUSCULAR | Status: AC
  Filled 2012-05-11: qty 1

## 2012-05-11 MED ORDER — LIDOCAINE VISCOUS 2 % MT SOLN
OROMUCOSAL | Status: AC
  Filled 2012-05-11: qty 15

## 2012-05-11 MED ORDER — FENTANYL CITRATE 0.05 MG/ML IJ SOLN
25.0000 ug | INTRAMUSCULAR | Status: DC | PRN
  Administered 2012-05-11 (×4): 50 ug via INTRAVENOUS

## 2012-05-11 SURGICAL SUPPLY — 34 items
BAG HAMPER (MISCELLANEOUS) ×1 IMPLANT
CLOTH BEACON ORANGE TIMEOUT ST (SAFETY) ×1 IMPLANT
COVER LIGHT HANDLE STERIS (MISCELLANEOUS) ×1 IMPLANT
DECANTER SPIKE VIAL GLASS SM (MISCELLANEOUS) ×2 IMPLANT
DRAPE PROXIMA HALF (DRAPES) ×1 IMPLANT
ELECT REM PT RETURN 9FT ADLT (ELECTROSURGICAL) ×2
ELECTRODE REM PT RTRN 9FT ADLT (ELECTROSURGICAL) IMPLANT
GLOVE ECLIPSE 7.0 STRL STRAW (GLOVE) ×1 IMPLANT
GLOVE INDICATOR 7.0 STRL GRN (GLOVE) ×3 IMPLANT
GLOVE INDICATOR 7.5 STRL GRN (GLOVE) ×1 IMPLANT
GLOVE SS BIOGEL STRL SZ 6.5 (GLOVE) IMPLANT
GLOVE SUPERSENSE BIOGEL SZ 6.5 (GLOVE) ×1
GOWN STRL REIN XL XLG (GOWN DISPOSABLE) ×3 IMPLANT
HEMOSTAT SURGICEL 4X8 (HEMOSTASIS) ×1 IMPLANT
KIT ROOM TURNOVER APOR (KITS) ×1 IMPLANT
MANIFOLD NEPTUNE II (INSTRUMENTS) ×1 IMPLANT
NDL HYPO 25X1 1.5 SAFETY (NEEDLE) IMPLANT
NEEDLE HYPO 25X1 1.5 SAFETY (NEEDLE) ×2 IMPLANT
NS IRRIG 1000ML POUR BTL (IV SOLUTION) ×1 IMPLANT
PACK PERI GYN (CUSTOM PROCEDURE TRAY) ×1 IMPLANT
PAD ARMBOARD 7.5X6 YLW CONV (MISCELLANEOUS) ×1 IMPLANT
POSITIONER HEAD PRONE TRACH (MISCELLANEOUS) ×1 IMPLANT
SET BASIN LINEN APH (SET/KITS/TRAYS/PACK) ×1 IMPLANT
SPONGE GAUZE 4X4 12PLY (GAUZE/BANDAGES/DRESSINGS) ×1 IMPLANT
SUT CHROMIC 2 0 SH (SUTURE) ×1 IMPLANT
SUT PROLENE 3 0 PS 2 (SUTURE) ×1 IMPLANT
SUT SILK 0 FSL (SUTURE) ×1 IMPLANT
SUT SILK 2 0 SH (SUTURE) ×1 IMPLANT
SUT SILK 3 0 (SUTURE) ×2
SUT SILK 3-0 FS1 18XBRD (SUTURE) IMPLANT
SUT VIC AB 3-0 SH 27 (SUTURE)
SUT VIC AB 3-0 SH 27X BRD (SUTURE) IMPLANT
SYR CONTROL 10ML LL (SYRINGE) ×1 IMPLANT
TAPE CLOTH SURG 4X10 WHT LF (GAUZE/BANDAGES/DRESSINGS) ×1 IMPLANT

## 2012-05-11 NOTE — Op Note (Signed)
Patient:  [name]  DOB:  1976/01/09  MRN:  [id number]   Preop Diagnosis:  Anal fistula  Postop Diagnosis:  The same  Procedure:  Exam under anesthesia, fistulectomy with Seton placement.  Surgeon:  Dr. Tilford Pillar  Anes:  General endotracheal, 0.5% Sensorcaine plain for local  Indications:  Patient is a 37 year old female presented my office with a history of multiple infections and a recurrence perirectal cyst. On evaluation of suspected the patient had a fistula and CT evaluation of the pelvis confirmed suspected fistulous tract. Risks benefits alternatives and exam under anesthesia, fistulotomy fistulectomy and possible Seton placement were discussed at length the patient including but not limited to risk of bleeding, infection, wound dehiscence, fistula recurrence, incontinence. Her questions and concerns are addressed. Patient was consented for the planned procedure.  Procedure note:  Patient was taken to the operating room was placed in the supine position the or table time the general anesthesia is administered. Once patient was asleep she symmetrically admitted by the nurse anesthetist. At this point she is placed into a high lithotomy position in yellowfin stirrups. Her perineum and rectum were prepped with Betadine solution. Drapes are placed in standard fashion. Time out was performed. This point a exam under anesthesia was performed. A digital rectal exam no palpable mass or nodularity is noted. There is some thickening at the 4:00 position but no open sinus is easily palpated. The external opening is approximately 6 cm from the anal verge. Palpation of this does express purulent material. I did open the thin lining of the external abscess/fistulous opening and used a lacrimal duct probe to identify the course of the fistulous tract. This proceeded approximately 3 cm proximal to the anal verge.  The course of the probe was palpated and I am highly suspicious that this is deep  to the sphincter musculature. Due to the length of the tract I did unroofing both proximally and distally leaving an approximate 2 cm bridge of tissue and over the sphincter musculature. The mucosa was scored with electrocautery over this area. I then reapproximated the proximal mucosa with a running 3-0 chromic suture up to the point of the probe. Similarly I excised the fistulous tract distally from the skin to near the anal verge. This was excised and placed as a permanent specimen for pathology in the back table. The small remnant of the fistulous tract was then curetted. The skin edges at the distal aspect of the tract the skin edges were reapproximated with interrupted 3-0 Prolene sutures. A 2-0 silk suture was then used as a Seton for the remainder of the fistulous tract. At this time a Surgicel tampon was inserted with viscous lidocaine into the rectum. The skin edges were washed and dried and sterile dressings were placed after the local anesthetic was instilled. The drapes removed and the dressings were secured. The patient was allowed to come out of general anesthetic and was transferred to the PACU in stable condition. At the conclusion of procedure all instrument, sponge, needle counts are correct. Patient tolerated procedure well.   Complications:   None apparent  EBL:   less than 50 ML's   Specimen: Fistulous tract. (Distal)

## 2012-05-11 NOTE — Progress Notes (Signed)
Pt refuses Celebrex due to "stabbing " sensation. Dr Leticia Penna informed.

## 2012-05-11 NOTE — Anesthesia Procedure Notes (Signed)
Procedure Name: Intubation Date/Time: 05/11/2012 8:04 AM Performed by: Glynn Octave E Pre-anesthesia Checklist: Patient identified, Patient being monitored, Timeout performed, Emergency Drugs available and Suction available Patient Re-evaluated:Patient Re-evaluated prior to inductionOxygen Delivery Method: Circle System Utilized Preoxygenation: Pre-oxygenation with 100% oxygen Intubation Type: IV induction, Rapid sequence and Cricoid Pressure applied Ventilation: Mask ventilation without difficulty Laryngoscope Size: Mac and 3 Grade View: Grade I Tube type: Oral Tube size: 7.0 mm Number of attempts: 1 Airway Equipment and Method: stylet Placement Confirmation: ETT inserted through vocal cords under direct vision,  positive ETCO2 and breath sounds checked- equal and bilateral Secured at: 21 cm Tube secured with: Tape Dental Injury: Teeth and Oropharynx as per pre-operative assessment

## 2012-05-11 NOTE — H&P (Signed)
NTS SOAP Note  Vital Signs:  Vitals as of: 03/31/2012: Systolic 179: Diastolic 112: Heart Rate 107: Temp 97.71F: Height 58ft 8in: Weight 274Lbs 0 Ounces: Pain Level 4: BMI 42  BMI : 41.66 kg/m2  Subjective: This 37 Years 0 Months old Female presents for of perirectal pain and bleeding.  Has noted off and on over the last year.  No change with BM.  Drainage occassionally purulent.  No history of inflammatory bowel history.  No history of trauma.  Review of Symptoms:  Constitutional:unremarkable   Head:unremarkable    Eyes:unremarkable   Nose/Mouth/Throat:unremarkable Cardiovascular:  unremarkable   Respiratory:unremarkable   Gastrointestinal:  unremarkable  as per HPI Genitourinary:unremarkable     Musculoskeletal:unremarkable   Skin:unremarkable Breast:unremarkable   Hematolgic/Lymphatic:unremarkable     Allergic/Immunologic:unremarkable     Past Medical History:  Obtained     Past Medical History  Pregnancy Gravida: 0 Pregnancy Para: 0 Surgical History: none Medical Problems: GERD, HIV, HTN Psychiatric History: anxiety Allergies: NSAIDs Medications: vicodin, clonazepam, protonix, lasix, valtrex   Social History:Obtained  Social History  Preferred Language: English Race:  White Ethnicity: Not Hispanic / Latino Age: 37 Years 0 Months Marital Status:  D Alcohol: none Recreational drug(s): none   Smoking Status: Current every day smoker reviewed on 04/03/2012 Started Date: 02/24/1995 Packs per day: 1.00 Functional Status reviewed on mm/dd/yyyy ------------------------------------------------ Bathing: Normal Cooking: Normal Dressing: Normal Driving: Normal Eating: Normal Managing Meds: Normal Oral Care: Normal Shopping: Normal Toileting: Normal Transferring: Normal Walking: Normal Cognitive Status reviewed on mm/dd/yyyy ------------------------------------------------ Attention: Normal Decision Making:  Normal Language: Normal Memory: Normal Motor: Normal Perception: Normal Problem Solving: Normal Visual and Spatial: Normal   Family History:Obtained     Family History  Is there a family history of: noncontributory    Objective Information: General:  Well appearing, well nourished in no distress.  Obese Skin:     no rash or prominent lesions Head:Atraumatic; no masses; no abnormalities Eyes:  conjunctiva clear, EOM intact, PERRL Mouth:  Mucous membranes moist, no mucosal lesions. Neck:  Supple without lymphadenopathy.  Heart:  RRR, no murmur Lungs:    CTA bilaterally, no wheezes, rhonchi, rales.  Breathing unlabored. Abdomen:Soft, NT/ND, no HSM, no masses.     +sinus tract.  No surrounding erythema.   Assessment:    Plan: Gluteal cyst vs fistula.  Discussed findings with the patient.  CT of pelvis will be obtained to evaluate for possible fistula tract.    Local care for now.     Follow-up:Pending Test Results                       Follow Up - 04/14/2012  Patient Name: [name] Date of Birth: Feb 27, 1975  Vital Signs:  Insert Vitals as of: 04/14/2012: Systolic 172: Diastolic 110: Heart Rate 102: Temp 36.56C: Height 173CM: Weight 128.37KG: Pain Level 4: BMI 43   BMI: 43.03 kg/m2  Subjective: This 37 Years 0 Months old Female presents for followup. Patient   has no  significant complaints. Still with intermittant drainage.   Social History:   Social History  Preferred Language: English Race:  White Ethnicity: Not Hispanic / Latino Age: 37 Years 0 Months Marital Status:  D Alcohol: none Recreational drug(s): none       Allergies:  Allergies Insert Code: No allergies found.     Objective: General: Well appearing, well nourished in no distress.    Assessment:     PO ZOX:WRUEAV  Plan:  Anal fistula.  Discussed CT  findings with the patient.  Patient wishes to proceed with  fistulotomy/ectomy pending intra-operative findings.  Follow-up:Pending Surgery

## 2012-05-11 NOTE — Anesthesia Preprocedure Evaluation (Signed)
Anesthesia Evaluation  Patient identified by MRN, date of birth, ID band Patient awake    Reviewed: Allergy & Precautions, H&P , NPO status , Patient's Chart, lab work & pertinent test results  History of Anesthesia Complications Negative for: history of anesthetic complications  Airway Mallampati: III TM Distance: >3 FB     Dental  (+) Teeth Intact   Pulmonary asthma , Current Smoker,  breath sounds clear to auscultation        Cardiovascular hypertension, Rhythm:Regular Rate:Normal     Neuro/Psych PSYCHIATRIC DISORDERS Anxiety Depression    GI/Hepatic GERD-  Medicated and Controlled,  Endo/Other  Morbid obesity  Renal/GU      Musculoskeletal   Abdominal   Peds  Hematology   Anesthesia Other Findings   Reproductive/Obstetrics                           Anesthesia Physical Anesthesia Plan  ASA: II  Anesthesia Plan: General   Post-op Pain Management:    Induction: Intravenous, Rapid sequence and Cricoid pressure planned  Airway Management Planned: Oral ETT  Additional Equipment:   Intra-op Plan:   Post-operative Plan: Extubation in OR  Informed Consent: I have reviewed the patients History and Physical, chart, labs and discussed the procedure including the risks, benefits and alternatives for the proposed anesthesia with the patient or authorized representative who has indicated his/her understanding and acceptance.     Plan Discussed with:   Anesthesia Plan Comments:         Anesthesia Quick Evaluation

## 2012-05-11 NOTE — Anesthesia Postprocedure Evaluation (Addendum)
  Anesthesia Post-op Note  Patient: [name]  Procedure(s) Performed: Procedure(s): ANAL FISTULOTOMY (N/A) EXAM UNDER ANESTHESIA (N/A) PLACEMENT OF SETON (N/A)  Patient Location: PACU  Anesthesia Type:General  Level of Consciousness: awake, alert  and oriented  Airway and Oxygen Therapy: Patient Spontanous Breathing and Patient connected to face mask oxygen  Post-op Pain: moderate  Post-op Assessment: Post-op Vital signs reviewed, Respiratory Function Stable, Patent Airway and No signs of Nausea or vomiting,  BP high, treating with labatelol,  5mg  at 0920  Post-op Vital Signs: Reviewed and stable  Complications: No apparent anesthesia complications

## 2012-05-11 NOTE — Transfer of Care (Signed)
Immediate Anesthesia Transfer of Care Note  Patient: [name]  Procedure(s) Performed: Procedure(s): ANAL FISTULOTOMY (N/A) EXAM UNDER ANESTHESIA (N/A) PLACEMENT OF SETON (N/A)  Patient Location: PACU  Anesthesia Type:General  Level of Consciousness: awake, alert  and oriented  Airway & Oxygen Therapy: Patient Spontanous Breathing and Patient connected to face mask oxygen  Post-op Assessment: Report given to PACU RN  Post vital signs: Reviewed and stable  Complications: No apparent anesthesia complications

## 2012-05-11 NOTE — Interval H&P Note (Signed)
History and Physical Interval Note:  05/11/2012 7:50 AM  [name]  has presented today for surgery, with the diagnosis of anal fistula  The various methods of treatment have been discussed with the patient and family. After consideration of risks, benefits and other options for treatment, the patient has consented to  Procedure(s): ANAL FISTULOTOMY (N/A) as a surgical intervention .  The patient's history has been reviewed, patient examined, no change in status, stable for surgery.  I have reviewed the patient's chart and labs.  Questions were answered to the patient's satisfaction.     [name] C

## 2012-05-12 ENCOUNTER — Encounter (HOSPITAL_COMMUNITY): Payer: Self-pay | Admitting: General Surgery

## 2012-06-15 ENCOUNTER — Ambulatory Visit: Payer: Self-pay | Admitting: Family Medicine

## 2012-07-06 DIAGNOSIS — F3164 Bipolar disorder, current episode mixed, severe, with psychotic features: Secondary | ICD-10-CM | POA: Diagnosis not present

## 2012-07-07 DIAGNOSIS — F3164 Bipolar disorder, current episode mixed, severe, with psychotic features: Secondary | ICD-10-CM | POA: Diagnosis not present

## 2012-08-03 DIAGNOSIS — G4730 Sleep apnea, unspecified: Secondary | ICD-10-CM | POA: Diagnosis not present

## 2012-08-03 DIAGNOSIS — F3164 Bipolar disorder, current episode mixed, severe, with psychotic features: Secondary | ICD-10-CM | POA: Diagnosis not present

## 2012-08-05 ENCOUNTER — Emergency Department (HOSPITAL_COMMUNITY): Payer: Medicare Other | Admitting: Anesthesiology

## 2012-08-05 ENCOUNTER — Inpatient Hospital Stay: Admit: 2012-08-05 | Payer: Self-pay | Admitting: Orthopedic Surgery

## 2012-08-05 ENCOUNTER — Encounter (HOSPITAL_COMMUNITY): Payer: Self-pay | Admitting: Anesthesiology

## 2012-08-05 ENCOUNTER — Encounter (HOSPITAL_COMMUNITY): Admission: EM | Disposition: A | Discharge status: Home / Self Care | Payer: Self-pay | Source: Home / Self Care

## 2012-08-05 ENCOUNTER — Encounter (HOSPITAL_COMMUNITY): Payer: Self-pay | Admitting: Certified Registered"

## 2012-08-05 ENCOUNTER — Emergency Department (HOSPITAL_COMMUNITY)
Admission: EM | Admit: 2012-08-05 | Discharge: 2012-08-05 | Disposition: A | Discharge status: Home / Self Care | Payer: Medicare Other | Source: Home / Self Care | Admitting: Orthopedic Surgery

## 2012-08-05 ENCOUNTER — Emergency Department (HOSPITAL_COMMUNITY)
Admission: EM | Admit: 2012-08-05 | Discharge: 2012-08-05 | Disposition: A | Discharge status: Home / Self Care | Payer: Medicare Other | Attending: Orthopedic Surgery | Admitting: Orthopedic Surgery

## 2012-08-05 ENCOUNTER — Emergency Department (HOSPITAL_COMMUNITY): Payer: Medicare Other

## 2012-08-05 ENCOUNTER — Encounter (HOSPITAL_COMMUNITY): Payer: Self-pay | Admitting: *Deleted

## 2012-08-05 DIAGNOSIS — F17200 Nicotine dependence, unspecified, uncomplicated: Secondary | ICD-10-CM | POA: Insufficient documentation

## 2012-08-05 DIAGNOSIS — W230XXA Caught, crushed, jammed, or pinched between moving objects, initial encounter: Secondary | ICD-10-CM | POA: Insufficient documentation

## 2012-08-05 DIAGNOSIS — X58XXXA Exposure to other specified factors, initial encounter: Secondary | ICD-10-CM | POA: Insufficient documentation

## 2012-08-05 DIAGNOSIS — J45909 Unspecified asthma, uncomplicated: Secondary | ICD-10-CM | POA: Insufficient documentation

## 2012-08-05 DIAGNOSIS — Z8739 Personal history of other diseases of the musculoskeletal system and connective tissue: Secondary | ICD-10-CM | POA: Insufficient documentation

## 2012-08-05 DIAGNOSIS — S6990XA Unspecified injury of unspecified wrist, hand and finger(s), initial encounter: Secondary | ICD-10-CM | POA: Diagnosis not present

## 2012-08-05 DIAGNOSIS — S6980XA Other specified injuries of unspecified wrist, hand and finger(s), initial encounter: Secondary | ICD-10-CM | POA: Diagnosis not present

## 2012-08-05 DIAGNOSIS — Y9289 Other specified places as the place of occurrence of the external cause: Secondary | ICD-10-CM | POA: Insufficient documentation

## 2012-08-05 DIAGNOSIS — Z933 Colostomy status: Secondary | ICD-10-CM | POA: Insufficient documentation

## 2012-08-05 DIAGNOSIS — S63269A Dislocation of metacarpophalangeal joint of unspecified finger, initial encounter: Secondary | ICD-10-CM | POA: Insufficient documentation

## 2012-08-05 DIAGNOSIS — F411 Generalized anxiety disorder: Secondary | ICD-10-CM | POA: Insufficient documentation

## 2012-08-05 DIAGNOSIS — F319 Bipolar disorder, unspecified: Secondary | ICD-10-CM | POA: Insufficient documentation

## 2012-08-05 DIAGNOSIS — S63104A Unspecified dislocation of right thumb, initial encounter: Secondary | ICD-10-CM

## 2012-08-05 DIAGNOSIS — Z79899 Other long term (current) drug therapy: Secondary | ICD-10-CM | POA: Insufficient documentation

## 2012-08-05 DIAGNOSIS — Z8719 Personal history of other diseases of the digestive system: Secondary | ICD-10-CM | POA: Insufficient documentation

## 2012-08-05 DIAGNOSIS — M79609 Pain in unspecified limb: Secondary | ICD-10-CM | POA: Diagnosis not present

## 2012-08-05 DIAGNOSIS — Y9389 Activity, other specified: Secondary | ICD-10-CM | POA: Insufficient documentation

## 2012-08-05 HISTORY — PX: FINGER CLOSED REDUCTION: SHX1633

## 2012-08-05 SURGERY — CLOSED REDUCTION, FRACTURE, METACARPAL BONE
Anesthesia: General | Site: Thumb | Laterality: Right | Wound class: Clean

## 2012-08-05 MED ORDER — HYDROMORPHONE HCL PF 1 MG/ML IJ SOLN: 0.2500 mg | INTRAMUSCULAR | Status: DC | PRN

## 2012-08-05 MED ORDER — BUPIVACAINE HCL (PF) 0.25 % IJ SOLN
INTRAMUSCULAR | Status: DC | PRN
  Administered 2012-08-05: 7 mL

## 2012-08-05 MED ORDER — OXYCODONE-ACETAMINOPHEN 5-325 MG PO TABS: 1.0000 | ORAL_TABLET | ORAL | Status: DC | PRN

## 2012-08-05 MED ORDER — MIDAZOLAM HCL 5 MG/5ML IJ SOLN
INTRAMUSCULAR | Status: DC | PRN
  Administered 2012-08-05: 2 mg via INTRAVENOUS

## 2012-08-05 MED ORDER — LIDOCAINE HCL (PF) 2 % IJ SOLN
2.0000 mL | Freq: Once | INTRAMUSCULAR | Status: DC
  Filled 2012-08-05 (×2): qty 10

## 2012-08-05 MED ORDER — LIDOCAINE HCL 4 % MT SOLN
OROMUCOSAL | Status: DC | PRN
  Administered 2012-08-05: 4 mL via TOPICAL

## 2012-08-05 MED ORDER — ONDANSETRON HCL 4 MG/2ML IJ SOLN
INTRAMUSCULAR | Status: DC | PRN
  Administered 2012-08-05: 4 mg via INTRAVENOUS

## 2012-08-05 MED ORDER — OXYCODONE HCL 5 MG/5ML PO SOLN: 5.0000 mg | Freq: Once | ORAL | Status: DC | PRN

## 2012-08-05 MED ORDER — DEXAMETHASONE SODIUM PHOSPHATE 4 MG/ML IJ SOLN
INTRAMUSCULAR | Status: DC | PRN
  Administered 2012-08-05: 4 mg via INTRAVENOUS

## 2012-08-05 MED ORDER — PROPOFOL 10 MG/ML IV BOLUS
INTRAVENOUS | Status: DC | PRN
  Administered 2012-08-05: 200 mg via INTRAVENOUS

## 2012-08-05 MED ORDER — LACTATED RINGERS IV SOLN
INTRAVENOUS | Status: DC | PRN
  Administered 2012-08-05: 22:00:00 via INTRAVENOUS

## 2012-08-05 MED ORDER — OXYCODONE-ACETAMINOPHEN 5-325 MG PO TABS
ORAL_TABLET | ORAL | Status: AC
  Administered 2012-08-05: 2
  Filled 2012-08-05: qty 2

## 2012-08-05 MED ORDER — SUFENTANIL CITRATE 50 MCG/ML IV SOLN
INTRAVENOUS | Status: DC | PRN
  Administered 2012-08-05: 20 ug via INTRAVENOUS
  Administered 2012-08-05: 10 ug via INTRAVENOUS

## 2012-08-05 MED ORDER — OXYCODONE HCL 5 MG PO TABS: 5.0000 mg | ORAL_TABLET | Freq: Once | ORAL | Status: DC | PRN

## 2012-08-05 MED ORDER — SUCCINYLCHOLINE CHLORIDE 20 MG/ML IJ SOLN
INTRAMUSCULAR | Status: DC | PRN
  Administered 2012-08-05: 100 mg via INTRAVENOUS

## 2012-08-05 MED ORDER — LIDOCAINE HCL (CARDIAC) 20 MG/ML IV SOLN
INTRAVENOUS | Status: DC | PRN
  Administered 2012-08-05: 100 mg via INTRAVENOUS

## 2012-08-05 SURGICAL SUPPLY — 52 items
BANDAGE ELASTIC 3 VELCRO ST LF (GAUZE/BANDAGES/DRESSINGS) ×4 IMPLANT
BANDAGE ELASTIC 4 VELCRO ST LF (GAUZE/BANDAGES/DRESSINGS) IMPLANT
BANDAGE GAUZE ELAST BULKY 4 IN (GAUZE/BANDAGES/DRESSINGS) IMPLANT
BNDG CMPR 9X4 STRL LF SNTH (GAUZE/BANDAGES/DRESSINGS)
BNDG CMPR MD 5X2 ELC HKLP STRL (GAUZE/BANDAGES/DRESSINGS) ×2
BNDG COHESIVE 1X5 TAN STRL LF (GAUZE/BANDAGES/DRESSINGS) IMPLANT
BNDG ELASTIC 2 VLCR STRL LF (GAUZE/BANDAGES/DRESSINGS) ×3 IMPLANT
BNDG ESMARK 4X9 LF (GAUZE/BANDAGES/DRESSINGS) ×1 IMPLANT
CAP PIN ORTHO PINK (CAP) IMPLANT
CAP PIN PROTECTOR ORTHO WHT (CAP) IMPLANT
CLOTH BEACON ORANGE TIMEOUT ST (SAFETY) ×1 IMPLANT
CORDS BIPOLAR (ELECTRODE) ×1 IMPLANT
COVER SURGICAL LIGHT HANDLE (MISCELLANEOUS) ×1 IMPLANT
CUFF TOURNIQUET SINGLE 18IN (TOURNIQUET CUFF) ×1 IMPLANT
CUFF TOURNIQUET SINGLE 24IN (TOURNIQUET CUFF) IMPLANT
DRAPE OEC MINIVIEW 54X84 (DRAPES) IMPLANT
DRAPE SURG 17X23 STRL (DRAPES) ×1 IMPLANT
DRSG ADAPTIC 3X8 NADH LF (GAUZE/BANDAGES/DRESSINGS) IMPLANT
GAUZE SPONGE 2X2 8PLY STRL LF (GAUZE/BANDAGES/DRESSINGS) IMPLANT
GLOVE BIOGEL PI IND STRL 8.5 (GLOVE) ×1 IMPLANT
GLOVE BIOGEL PI INDICATOR 8.5 (GLOVE)
GLOVE SURG ORTHO 8.0 STRL STRW (GLOVE) ×1 IMPLANT
GOWN PREVENTION PLUS XLARGE (GOWN DISPOSABLE) ×1 IMPLANT
GOWN STRL NON-REIN LRG LVL3 (GOWN DISPOSABLE) ×2 IMPLANT
K-WIRE SMTH SNGL TROCAR .028X4 (WIRE)
KIT BASIN OR (CUSTOM PROCEDURE TRAY) ×1 IMPLANT
KIT ROOM TURNOVER OR (KITS) ×3 IMPLANT
KWIRE SMTH SNGL TROCAR .028X4 (WIRE) IMPLANT
MANIFOLD NEPTUNE II (INSTRUMENTS) ×1 IMPLANT
NDL HYPO 25GX1X1/2 BEV (NEEDLE) IMPLANT
NEEDLE HYPO 25GX1X1/2 BEV (NEEDLE) ×3 IMPLANT
NS IRRIG 1000ML POUR BTL (IV SOLUTION) ×1 IMPLANT
PACK ORTHO EXTREMITY (CUSTOM PROCEDURE TRAY) ×1 IMPLANT
PAD ARMBOARD 7.5X6 YLW CONV (MISCELLANEOUS) ×4 IMPLANT
PAD CAST 4YDX4 CTTN HI CHSV (CAST SUPPLIES) IMPLANT
PADDING CAST COTTON 2X4 NS (CAST SUPPLIES) ×2 IMPLANT
PADDING CAST COTTON 4X4 STRL (CAST SUPPLIES)
PADDING UNDERCAST 2  STERILE (CAST SUPPLIES) ×5 IMPLANT
SOAP 2 % CHG 4 OZ (WOUND CARE) ×1 IMPLANT
SPLINT FIBERGLASS 3X12 (CAST SUPPLIES) ×2 IMPLANT
SPONGE GAUZE 2X2 STER 10/PKG (GAUZE/BANDAGES/DRESSINGS)
SPONGE GAUZE 4X4 12PLY (GAUZE/BANDAGES/DRESSINGS) IMPLANT
SUCTION FRAZIER TIP 10 FR DISP (SUCTIONS) IMPLANT
SUT MERSILENE 4 0 P 3 (SUTURE) IMPLANT
SUT PROLENE 4 0 PS 2 18 (SUTURE) IMPLANT
SYR CONTROL 10ML LL (SYRINGE) IMPLANT
SYRINGE 10CC LL (SYRINGE) ×2 IMPLANT
TOWEL OR 17X24 6PK STRL BLUE (TOWEL DISPOSABLE) ×1 IMPLANT
TOWEL OR 17X26 10 PK STRL BLUE (TOWEL DISPOSABLE) ×1 IMPLANT
TUBE CONNECTING 12X1/4 (SUCTIONS) IMPLANT
UNDERPAD 30X30 INCONTINENT (UNDERPADS AND DIAPERS) ×1 IMPLANT
WATER STERILE IRR 1000ML POUR (IV SOLUTION) ×1 IMPLANT

## 2012-08-05 NOTE — Brief Op Note (Signed)
08/05/2012  10:03 PM  PATIENT:  [name]  37 y.o. female  PRE-OPERATIVE DIAGNOSIS:  Dislocated Right Thumb  POST-OPERATIVE DIAGNOSIS:  SAME  PROCEDURE:  CLOSED MANIPULATION RIGHT THUMB MP JOINT  SURGEON:  Surgeon(s) and Role:    * Sharma Covert, MD - Primary  PHYSICIAN ASSISTANT: NONE  ASSISTANTS: none   ANESTHESIA:   general  EBL:   NONE  BLOOD ADMINISTERED:none  DRAINS: none   LOCAL MEDICATIONS USED:  MARCAINE     SPECIMEN:  No Specimen  DISPOSITION OF SPECIMEN:  N/A  COUNTS:  YES  TOURNIQUET:  NONE  DICTATION: .[id number]  PLAN OF CARE: Discharge to home after PACU  PATIENT DISPOSITION:  PACU - hemodynamically stable.   Delay start of Pharmacological VTE agent (>24hrs) due to surgical blood loss or risk of bleeding: not applicable

## 2012-08-05 NOTE — ED Notes (Signed)
Attempts to reduce subluxation  By J. Idol,, PA and by Dr Vincente Poli , without success.

## 2012-08-05 NOTE — ED Notes (Signed)
Pt bypassed ED triage and taken to OR via wheelchair by Robyn EMT per Dr. Tyrone Apple request

## 2012-08-05 NOTE — Anesthesia Postprocedure Evaluation (Signed)
  Anesthesia Post-op Note  Patient: [name]  Procedure(s) Performed: Procedure(s): CLOSED REDUCTION RIGHT THUMB (FINGER) (Right)  Patient Location: PACU  Anesthesia Type:General  Level of Consciousness: awake, alert  and oriented  Airway and Oxygen Therapy: Patient Spontanous Breathing and Patient connected to nasal cannula oxygen  Post-op Pain: mild  Post-op Assessment: Post-op Vital signs reviewed, Patient's Cardiovascular Status Stable, Respiratory Function Stable, Patent Airway and No signs of Nausea or vomiting  Post-op Vital Signs: Reviewed and stable  Complications: No apparent anesthesia complications

## 2012-08-05 NOTE — Anesthesia Preprocedure Evaluation (Signed)
Anesthesia Evaluation  Patient identified by MRN, date of birth, ID band Patient awake    Reviewed: Allergy & Precautions, H&P , NPO status , Patient's Chart, lab work & pertinent test results  Airway Mallampati: II TM Distance: >3 FB Neck ROM: Full    Dental no notable dental hx. (+) Teeth Intact and Dental Advisory Given   Pulmonary asthma , Current Smoker,  breath sounds clear to auscultation  Pulmonary exam normal       Cardiovascular negative cardio ROS  Rhythm:Regular Rate:Normal     Neuro/Psych PSYCHIATRIC DISORDERS negative neurological ROS     GI/Hepatic Neg liver ROS, GERD-  Medicated and Controlled,  Endo/Other  Morbid obesity  Renal/GU negative Renal ROS  negative genitourinary   Musculoskeletal   Abdominal   Peds  Hematology negative hematology ROS (+)   Anesthesia Other Findings   Reproductive/Obstetrics negative OB ROS                           Anesthesia Physical Anesthesia Plan  ASA: III  Anesthesia Plan: General   Post-op Pain Management:    Induction: Intravenous, Rapid sequence and Cricoid pressure planned  Airway Management Planned: Oral ETT  Additional Equipment:   Intra-op Plan:   Post-operative Plan: Extubation in OR  Informed Consent: I have reviewed the patients History and Physical, chart, labs and discussed the procedure including the risks, benefits and alternatives for the proposed anesthesia with the patient or authorized representative who has indicated his/her understanding and acceptance.   Dental advisory given  Plan Discussed with: CRNA  Anesthesia Plan Comments:         Anesthesia Quick Evaluation

## 2012-08-05 NOTE — H&P (Signed)
[name] is an 37 y.o. female.   Chief Complaint: right thumb injury  HPI: history documented and reviewed from [facility], julie idol PA note reviewed Pt with history of thumb injury pt here for deformity and sudden onset pain after jamming in door No prior surgery to right thumb  Past Medical History  Diagnosis Date  . GERD (gastroesophageal reflux disease)   . Diverticulitis 2008    perforated/ requiring resection  . Colostomy in place   . Bipolar affective   . Depression   . Anxiety   . Asthma   . Arthritis     Past Surgical History  Procedure Laterality Date  . Exploratory laparotomy with resection  2008  . Colostomy reversal  11/2006  . Abdominal wall hernia  07/2007    open repair with lysis of adhesions  . Hx of abd wall seroma  08/2007    Drained via [place] in [place], [place]  . Hx of abd wall seroma  10/2007    Drained by Dr. Leticia Penna in office  . Tonsillectomy    . Kidney stones    . Hernia repair    . Examination under anesthesia N/A 05/11/2012    Procedure: EXAM UNDER ANESTHESIA;  Surgeon: Fabio Bering, MD;  Location: [facility];  Service: General;  Laterality: N/A;  . Placement of seton N/A 05/11/2012    Procedure: PLACEMENT OF SETON;  Surgeon: Fabio Bering, MD;  Location: [facility];  Service: General;  Laterality: N/A;  . Treatment fistula anal      Family History  Problem Relation Age of Onset  . Asthma Maternal Grandmother    Social History:  reports that she has been smoking Cigarettes.  She has a 7 pack-year smoking history. She has never used smokeless tobacco. She reports that she does not drink alcohol or use illicit drugs.  Allergies: No Known Allergies   (Not in a hospital admission)  No results found for this or any previous visit (from the past 48 hour(s)). Dg Finger Thumb Right  08/05/2012   *RADIOLOGY REPORT*  Clinical Data: Right thumb injury and pain.  RIGHT THUMB 2+V  Comparison: None  Findings: Posterior subluxation of the thumb  is seen at the MCP joint.  No fracture identified.  No other bone abnormality visualized.  IMPRESSION: Posterior subluxation of the thumb MCP joint.  No fracture identified.   Original Report Authenticated By: Myles Rosenthal, M.D.    NO RECENT ILLNESSES OR HOSPITALIZATIONS  There were no vitals taken for this visit. General Appearance:  Alert, cooperative, no distress, appears stated age  Head:  Normocephalic, without obvious abnormality, atraumatic  Eyes:  Pupils equal, conjunctiva/corneas clear,         Throat: Lips, mucosa, and tongue normal; teeth and gums normal  Neck: No visible masses     Lungs:   respirations unlabored  Chest Wall:  No tenderness or deformity  Heart:  Regular rate and rhythm,  Abdomen:   Soft, non-tender,         Extremities: RIGHT THUMB IN THUMB SPICA SPLINT, FINGER WARM WELL PERFUSED. GOOD MOBILITY OF INDEX/LONG/RING FINGERS  Pulses: 2+ and symmetric  Skin: Skin color, texture, turgor normal, no rashes or lesions     Neurologic: Normal    Assessment/Plan RIGHT THUMB MP DISLOCATION/SUBLUXATION, IRREDUCIBLE IN ED  TO OR FOR CLOSED MANIPULATION AND POSSIBLE PINNING POSSIBLE OPEN REDUCTION AND INTERNAL FIXATION  R/B/A DISCUSSED WITH PT IN HOLDING AREA.  PT VOICED UNDERSTANDING OF PLAN CONSENT  SIGNED DAY OF SURGERY PT SEEN AND EXAMINED PRIOR TO OPERATIVE PROCEDURE/DAY OF SURGERY SITE MARKED. QUESTIONS ANSWERED WILL GO HOME FOLLOWING SURGERY  Sharma Covert 08/05/2012, 10:00 PM

## 2012-08-05 NOTE — ED Provider Notes (Signed)
History     CSN: [id number]  Arrival date & time 08/05/12  1746   First MD Initiated Contact with Patient 08/05/12 1801      Chief Complaint  Patient presents with  . Hand Injury    (Consider location/radiation/quality/duration/timing/severity/associated sxs/prior treatment) HPI Comments: [name] is a 37 y.o. Female presenting for evaluation of pain and deformity to her right thumb which occurred one hour before arrival.  She states she was simply opening her car door, predominantly using her right thumb when she developed sudden pain and deformity of the thumb.  She reports constant pain which is constant, but worse with attempts at range of motion.  She does have sensation in the fingertip.  She reports that both she and a family member attempted to reduce the joint prior to arrival, but her pain was too great.  She also mentions that she has had numbness in her right ring and fifth finger for approximately the past month, she denies any injury or overuse.     The history is provided by the patient.    Past Medical History  Diagnosis Date  . GERD (gastroesophageal reflux disease)   . Diverticulitis 2008    perforated/ requiring resection  . Colostomy in place   . Bipolar affective   . Depression   . Anxiety   . Asthma   . Arthritis     Past Surgical History  Procedure Laterality Date  . Exploratory laparotomy with resection  2008  . Colostomy reversal  11/2006  . Abdominal wall hernia  07/2007    open repair with lysis of adhesions  . Hx of abd wall seroma  08/2007    Drained via [place] in [place], [place]  . Hx of abd wall seroma  10/2007    Drained by Dr. Leticia Penna in office  . Tonsillectomy    . Kidney stones    . Hernia repair    . Examination under anesthesia N/A 05/11/2012    Procedure: EXAM UNDER ANESTHESIA;  Surgeon: Fabio Bering, MD;  Location: [facility];  Service: General;  Laterality: N/A;  . Placement of seton N/A 05/11/2012    Procedure: PLACEMENT OF  SETON;  Surgeon: Fabio Bering, MD;  Location: [facility];  Service: General;  Laterality: N/A;  . Treatment fistula anal      Family History  Problem Relation Age of Onset  . Asthma Maternal Grandmother     History  Substance Use Topics  . Smoking status: Current Every Day Smoker -- 0.50 packs/day for 14 years    Types: Cigarettes  . Smokeless tobacco: Never Used  . Alcohol Use: No    OB History   Grav Para Term Preterm Abortions TAB SAB Ect Mult Living                  Review of Systems  Constitutional: Negative for fever.  Gastrointestinal: Negative for nausea.  Musculoskeletal: Positive for joint swelling and arthralgias. Negative for myalgias.  Skin: Negative for color change and wound.  Neurological: Negative for weakness and numbness.    Allergies  Review of patient's allergies indicates no known allergies.  Home Medications   Current Outpatient Rx  Name  Route  Sig  Dispense  Refill  . albuterol (PROAIR HFA) 108 (90 BASE) MCG/ACT inhaler   Inhalation   Inhale 2 puffs into the lungs every 6 (six) hours as needed for wheezing. 2 puffs every 4 hours as needed only  if your can't catch  your breath   1 Inhaler   1   . Armodafinil (NUVIGIL) 250 MG tablet   Oral   Take 125 mg by mouth daily.         . clonazePAM (KLONOPIN) 1 MG tablet   Oral   Take 1-2 mg by mouth at bedtime. Anxiety         . lithium carbonate 300 MG capsule   Oral   Take 600 mg by mouth at bedtime.          Marland Kitchen oxyCODONE-acetaminophen (PERCOCET) 7.5-325 MG per tablet   Oral   Take 1-2 tablets by mouth every 4 (four) hours as needed for pain.   45 tablet   0   . QUEtiapine (SEROQUEL XR) 400 MG 24 hr tablet   Oral   Take 800 mg by mouth at bedtime.         Marland Kitchen zolpidem (AMBIEN) 10 MG tablet   Oral   Take 15 mg by mouth at bedtime as needed for sleep.         Marland Kitchen oxyCODONE-acetaminophen (PERCOCET/ROXICET) 5-325 MG per tablet   Oral   Take 1 tablet by mouth every 4 (four) hours  as needed for pain.   20 tablet   0     BP 142/84  Pulse 102  Temp(Src) 98.5 F (36.9 C) (Oral)  Resp 22  Ht 5\' 8"  (1.727 m)  Wt 254 lb (115.214 kg)  BMI 38.63 kg/m2  SpO2 97%  Physical Exam  Constitutional: She appears well-developed and well-nourished.  HENT:  Head: Atraumatic.  Neck: Normal range of motion.  Cardiovascular:  Pulses equal bilaterally  Musculoskeletal: She exhibits tenderness.       Right hand: She exhibits bony tenderness, deformity and swelling. She exhibits normal capillary refill. Normal sensation noted.  Obvious dislocation of the MCP joints of patient's right thumb, posteriorly dislocated.  Distal sensation is intact.  Less than 3 second cap refill.  She displays full range of motion of the right wrist with minimal discomfort.  Forearm is nontender.  Neurological: She is alert. She has normal strength. She displays normal reflexes. No sensory deficit.  Equal strength  Skin: Skin is warm and dry.  Psychiatric: She has a normal mood and affect.    ED Course  ORTHOPEDIC INJURY TREATMENT Date/Time: 08/05/2012 7:35 PM Performed by: Burgess Amor Authorized by: Burgess Amor Consent: Verbal consent obtained. Risks and benefits: risks, benefits and alternatives were discussed Consent given by: patient Injury location: finger Location details: right thumb Injury type: dislocation Dislocation type: MCP Pre-procedure neurovascular assessment: neurovascularly intact Pre-procedure distal perfusion: normal Pre-procedure neurological function: normal Pre-procedure range of motion: reduced Local anesthesia used: yes Anesthesia: local infiltration Local anesthetic: lidocaine 2% without epinephrine Anesthetic total: 8 ml Manipulation performed: yes Reduction successful: no Immobilization: splint Splint type: radial gutter Post-procedure neurovascular assessment: post-procedure neurovascularly intact Post-procedure distal perfusion: normal Post-procedure  neurological function: normal Post-procedure range of motion: unchanged Patient tolerance: Patient tolerated the procedure well with no immediate complications.   (including critical care time)  Labs Reviewed - No data to display Dg Finger Thumb Right  08/05/2012   *RADIOLOGY REPORT*  Clinical Data: Right thumb injury and pain.  RIGHT THUMB 2+V  Comparison: None  Findings: Posterior subluxation of the thumb is seen at the MCP joint.  No fracture identified.  No other bone abnormality visualized.  IMPRESSION: Posterior subluxation of the thumb MCP joint.  No fracture identified.   Original Report Authenticated By: Myles Rosenthal, M.D.  1. Thumb dislocation, right, initial encounter       MDM  Discussed patient with Dr. Fonnie Jarvis who also saw patient and unsuccessfully attempted reduction of her thumb dislocation.  Call placed to Dr. Melvyn Novas and discussed with him.  He suggested patient traveled to [place] at this present time he will meet her in the emergency department for further evaluation and management of this dislocation.  Agreeable to this, she was advised to maintain n.p.o. status which she agrees to do.  Further management pending Dr. Glenna Durand evaluation.  Contacted Arletta Bale, charge nurse at [facility] ED who will notify triage of patient's pending arrival.        Burgess Amor, Cordelia Poche 08/05/12 2149

## 2012-08-05 NOTE — ED Notes (Signed)
Numbness to right ring and pinky fingers radiating up right forearm x 1 month. States was opening car door today when thumb "broke."  Obvious right thumb deformity noted.

## 2012-08-05 NOTE — Transfer of Care (Signed)
Immediate Anesthesia Transfer of Care Note  Patient: [name]  Procedure(s) Performed: Procedure(s): CLOSED REDUCTION RIGHT THUMB (FINGER) (Right)  Patient Location: PACU  Anesthesia Type:General  Level of Consciousness: awake, alert , oriented and patient cooperative  Airway & Oxygen Therapy: Patient Spontanous Breathing and Patient connected to nasal cannula oxygen  Post-op Assessment: Report given to PACU RN, Post -op Vital signs reviewed and stable and Patient moving all extremities X 4  Post vital signs: Reviewed and stable  Complications: No apparent anesthesia complications

## 2012-08-05 NOTE — ED Notes (Signed)
Deformity to rt thumb, says she was opening  Car door and her thumb dislocated.

## 2012-08-05 NOTE — ED Notes (Signed)
Radial gutter splint applied. With sling

## 2012-08-06 NOTE — Op Note (Signed)
NAMEALBINA, [name]                ACCOUNT NO.:  [id number]  MEDICAL RECORD NO.:  [id number]  LOCATION:  [facility]                         FACILITY:  [facility]  PHYSICIAN:  Madelynn Done, MD  DATE OF BIRTH:  [birth year]  DATE OF PROCEDURE:  08/05/2012 DATE OF DISCHARGE:  08/05/2012                              OPERATIVE REPORT   PREOPERATIVE DIAGNOSIS:  Right thumb irreducible MP dislocation.  POSTOPERATIVE DIAGNOSIS:  Right thumb irreducible MP dislocation.  ATTENDING PHYSICIAN:  Madelynn Done, MD who scrubbed and present for the entire procedure.  ASSISTANT SURGEON:  None.  ANESTHESIA:  General via endotracheal tube.  SURGICAL PROCEDURE: 1. Closed manipulation of right thumb MP dislocation requiring     anesthesia. 2. Radiographs 2 views, right thumb.  SURGICAL INDICATIONS:  Ms. Etzkorn is a right-hand-dominant female, who had undergone several times, a close manipulation of the right thumb in [facility] for an MP dislocation.  The patient was seen and evaluated and  given the nature of her injury, and other comorbidities it is recommended she undergo the above procedure.  Risks, benefits, and alternatives were discussed in detail with the patient.  DESCRIPTION OF PROCEDURE:  The patient was properly identified in the preop holding area, and marked with a permanent marker made on the right thumb to indicate the correct operative side.  The patient was then brought back to the operating room and placed supine on the anesthesia room table, where general anesthesia was administered.  The patient tolerated this well.  After adequate anesthesia, closed manipulation was then performed.  This reduced the thumb MP joint very well.  This confirmed using mini C-arm.  The patient was then placed in a well- padded thumb spica splint.  The patient tolerated the procedure well, extubated and taken to recovery room in good condition.  POSTPROCEDURE PLAN:  The patient discharged  to home, see back in the office in 10 days for  x-rays out of the splint and then likely transition to a short-arm removable thumb spica brace immobilizing the MP joint.  A total of 3 weeks of immobilization and gradual use and activity of the thumb, get her into the therapy at the postop visit. Radiographs at each visit.     Madelynn Done, MD     FWO/MEDQ  D:  08/05/2012  T:  08/06/2012  Job:  [id number]

## 2012-08-06 NOTE — ED Provider Notes (Signed)
Medical screening examination/treatment/procedure(s) were conducted as a shared visit with non-physician practitioner(s) and myself.  I personally evaluated the patient during the encounter.  Unsuccessfully assisted in attempted closed manual reduction thumb.   Hurman Horn, MD 08/06/12 [phone]

## 2012-08-09 ENCOUNTER — Encounter (HOSPITAL_COMMUNITY): Payer: Self-pay | Admitting: Orthopedic Surgery

## 2012-08-16 DIAGNOSIS — F3164 Bipolar disorder, current episode mixed, severe, with psychotic features: Secondary | ICD-10-CM | POA: Diagnosis not present

## 2012-08-18 DIAGNOSIS — F411 Generalized anxiety disorder: Secondary | ICD-10-CM | POA: Diagnosis not present

## 2012-08-18 DIAGNOSIS — D508 Other iron deficiency anemias: Secondary | ICD-10-CM | POA: Diagnosis not present

## 2012-08-18 DIAGNOSIS — F329 Major depressive disorder, single episode, unspecified: Secondary | ICD-10-CM | POA: Diagnosis not present

## 2012-08-18 DIAGNOSIS — G8929 Other chronic pain: Secondary | ICD-10-CM | POA: Diagnosis not present

## 2012-08-18 DIAGNOSIS — Z6841 Body Mass Index (BMI) 40.0 and over, adult: Secondary | ICD-10-CM | POA: Diagnosis not present

## 2012-08-23 DIAGNOSIS — S63259A Unspecified dislocation of unspecified finger, initial encounter: Secondary | ICD-10-CM | POA: Diagnosis not present

## 2012-09-07 DIAGNOSIS — F3164 Bipolar disorder, current episode mixed, severe, with psychotic features: Secondary | ICD-10-CM | POA: Diagnosis not present

## 2012-09-13 DIAGNOSIS — S63259A Unspecified dislocation of unspecified finger, initial encounter: Secondary | ICD-10-CM | POA: Diagnosis not present

## 2012-09-19 DIAGNOSIS — F3164 Bipolar disorder, current episode mixed, severe, with psychotic features: Secondary | ICD-10-CM | POA: Diagnosis not present

## 2012-10-29 DIAGNOSIS — L259 Unspecified contact dermatitis, unspecified cause: Secondary | ICD-10-CM | POA: Diagnosis not present

## 2012-10-29 DIAGNOSIS — R109 Unspecified abdominal pain: Secondary | ICD-10-CM | POA: Diagnosis not present

## 2012-10-29 DIAGNOSIS — E669 Obesity, unspecified: Secondary | ICD-10-CM | POA: Diagnosis not present

## 2012-10-29 DIAGNOSIS — Z23 Encounter for immunization: Secondary | ICD-10-CM | POA: Diagnosis not present

## 2012-10-29 DIAGNOSIS — Z6841 Body Mass Index (BMI) 40.0 and over, adult: Secondary | ICD-10-CM | POA: Diagnosis not present

## 2012-11-01 DIAGNOSIS — F3164 Bipolar disorder, current episode mixed, severe, with psychotic features: Secondary | ICD-10-CM | POA: Diagnosis not present

## 2012-11-07 ENCOUNTER — Other Ambulatory Visit (HOSPITAL_COMMUNITY): Payer: Self-pay | Admitting: Family Medicine

## 2012-11-07 DIAGNOSIS — R109 Unspecified abdominal pain: Secondary | ICD-10-CM

## 2012-11-08 DIAGNOSIS — F3164 Bipolar disorder, current episode mixed, severe, with psychotic features: Secondary | ICD-10-CM | POA: Diagnosis not present

## 2012-11-10 ENCOUNTER — Ambulatory Visit (HOSPITAL_COMMUNITY): Payer: Medicare Other | Attending: Family Medicine

## 2012-12-03 DIAGNOSIS — J0100 Acute maxillary sinusitis, unspecified: Secondary | ICD-10-CM | POA: Diagnosis not present

## 2012-12-03 DIAGNOSIS — G8929 Other chronic pain: Secondary | ICD-10-CM | POA: Diagnosis not present

## 2012-12-03 DIAGNOSIS — I10 Essential (primary) hypertension: Secondary | ICD-10-CM | POA: Diagnosis not present

## 2012-12-03 DIAGNOSIS — Z6841 Body Mass Index (BMI) 40.0 and over, adult: Secondary | ICD-10-CM | POA: Diagnosis not present

## 2012-12-13 ENCOUNTER — Ambulatory Visit (HOSPITAL_COMMUNITY): Payer: Medicare Other

## 2012-12-15 ENCOUNTER — Ambulatory Visit (HOSPITAL_COMMUNITY)
Admission: RE | Admit: 2012-12-15 | Discharge: 2012-12-15 | Disposition: A | Discharge status: Home / Self Care | Payer: Medicare Other | Source: Ambulatory Visit | Attending: Family Medicine | Admitting: Family Medicine

## 2012-12-15 ENCOUNTER — Ambulatory Visit: Payer: Medicare Other | Admitting: Gastroenterology

## 2012-12-15 DIAGNOSIS — K8020 Calculus of gallbladder without cholecystitis without obstruction: Secondary | ICD-10-CM | POA: Diagnosis not present

## 2012-12-15 DIAGNOSIS — R109 Unspecified abdominal pain: Secondary | ICD-10-CM | POA: Diagnosis not present

## 2012-12-28 ENCOUNTER — Telehealth: Payer: Self-pay | Admitting: Gastroenterology

## 2012-12-28 ENCOUNTER — Ambulatory Visit: Payer: Medicare Other | Admitting: Gastroenterology

## 2012-12-28 NOTE — Telephone Encounter (Signed)
Pt was a no show

## 2012-12-28 NOTE — Telephone Encounter (Signed)
First no-show, please send letter.

## 2013-01-02 ENCOUNTER — Encounter: Payer: Self-pay | Admitting: General Practice

## 2013-01-02 DIAGNOSIS — F3164 Bipolar disorder, current episode mixed, severe, with psychotic features: Secondary | ICD-10-CM | POA: Diagnosis not present

## 2013-01-02 NOTE — Telephone Encounter (Signed)
LETTER MAILED

## 2013-01-30 DIAGNOSIS — F3164 Bipolar disorder, current episode mixed, severe, with psychotic features: Secondary | ICD-10-CM | POA: Diagnosis not present

## 2013-02-01 DIAGNOSIS — F3164 Bipolar disorder, current episode mixed, severe, with psychotic features: Secondary | ICD-10-CM | POA: Diagnosis not present

## 2013-02-03 DIAGNOSIS — H66009 Acute suppurative otitis media without spontaneous rupture of ear drum, unspecified ear: Secondary | ICD-10-CM | POA: Diagnosis not present

## 2013-02-03 DIAGNOSIS — Z6841 Body Mass Index (BMI) 40.0 and over, adult: Secondary | ICD-10-CM | POA: Diagnosis not present

## 2013-02-28 DIAGNOSIS — F3164 Bipolar disorder, current episode mixed, severe, with psychotic features: Secondary | ICD-10-CM | POA: Diagnosis not present

## 2013-04-10 ENCOUNTER — Other Ambulatory Visit: Payer: Self-pay | Admitting: Adult Health

## 2013-04-17 DIAGNOSIS — F3164 Bipolar disorder, current episode mixed, severe, with psychotic features: Secondary | ICD-10-CM | POA: Diagnosis not present

## 2013-04-18 ENCOUNTER — Ambulatory Visit (INDEPENDENT_AMBULATORY_CARE_PROVIDER_SITE_OTHER): Payer: Medicare Other | Admitting: General Surgery

## 2013-04-25 ENCOUNTER — Ambulatory Visit (INDEPENDENT_AMBULATORY_CARE_PROVIDER_SITE_OTHER): Payer: Medicare Other | Admitting: General Surgery

## 2013-04-25 ENCOUNTER — Encounter (INDEPENDENT_AMBULATORY_CARE_PROVIDER_SITE_OTHER): Payer: Self-pay | Admitting: General Surgery

## 2013-04-25 ENCOUNTER — Encounter (INDEPENDENT_AMBULATORY_CARE_PROVIDER_SITE_OTHER): Payer: Self-pay

## 2013-04-25 VITALS — BP 130/84 | HR 120 | Temp 97.8°F | Resp 16 | Ht 68.0 in | Wt 318.2 lb

## 2013-04-25 DIAGNOSIS — R1031 Right lower quadrant pain: Secondary | ICD-10-CM

## 2013-04-25 DIAGNOSIS — K8020 Calculus of gallbladder without cholecystitis without obstruction: Secondary | ICD-10-CM | POA: Diagnosis not present

## 2013-04-25 NOTE — Progress Notes (Signed)
Patient ID: [name], female   DOB: 05/03/1975, 38 y.o.   MRN: [id number]  Chief Complaint  Patient presents with  . New Evaluation    eval Gallstones    HPI [name] is a 38 y.o. female.  With cholelithiasis and abdominal pain occasionally in the right upper quadrant.  HPI Looking back in the patient's record she has had a history of cholelithiasis dating back to 2012. At that time she underwent a workup including an ultrasound and a HIDA scan which demonstrated cholelithiasis and an ejection fraction of 68%. Apparently surgery was not recommended at that time.  Recently over the last 3 months or so the patient has been having dull aching discomfort in the right upper quadrant. She takes ibuprofen possibly. She did not specifically associate her pain with any particular maneuver or eating any particular foods with the exception of maybe green beans. States that most of her pain has come when she is trying not to eat and has cut back on food.  She's had no jaundice, diarrhea, or constipation. She has a complicated past surgical history of a ruptured diverticulum with stool peritonitis back in 2008. She had reversal of her colostomy in 2009 and then subsequently a ventral hernia repair performed likely with mesh. She developed a postoperative seroma which had to be drained on multiple occasions. Her last operative procedure was back in 2009 although she has had anal fistula surgery more recently by a surgeon in [place]. Past Medical History  Diagnosis Date  . GERD (gastroesophageal reflux disease)   . Diverticulitis 2008    perforated/ requiring resection  . Colostomy in place   . Bipolar affective   . Depression   . Anxiety   . Asthma   . Arthritis   . Blood transfusion without reported diagnosis     Past Surgical History  Procedure Laterality Date  . Exploratory laparotomy with resection  2008  . Colostomy reversal  11/2006  . Abdominal wall hernia  07/2007    open  repair with lysis of adhesions  . Hx of abd wall seroma  08/2007    Drained via [place] in [place], [place]  . Hx of abd wall seroma  10/2007    Drained by Dr. Geroge Baseman in office  . Tonsillectomy    . Kidney stones    . Hernia repair    . Examination under anesthesia N/A 05/11/2012    Procedure: EXAM UNDER ANESTHESIA;  Surgeon: Donato Heinz, MD;  Location: [facility];  Service: General;  Laterality: N/A;  . Placement of seton N/A 05/11/2012    Procedure: PLACEMENT OF SETON;  Surgeon: Donato Heinz, MD;  Location: [facility];  Service: General;  Laterality: N/A;  . Treatment fistula anal    . Finger closed reduction Right 08/05/2012    Procedure: CLOSED REDUCTION RIGHT THUMB (FINGER);  Surgeon: Linna Hoff, MD;  Location: [facility];  Service: Orthopedics;  Laterality: Right;  . Esophagogastroduodenoscopy  03/13/2010    [id number] esophagus, status post passage of a Maloney dilator/Small hiatal hernia/ Antral erosions, status post biopsy  . Colonoscopy  03/13/2010    [id number] hemorrhoids likely cause of hematochezia, otherwise normal    Family History  Problem Relation Age of Onset  . Asthma Maternal Grandmother     Social History History  Substance Use Topics  . Smoking status: Current Every Day Smoker -- 0.50 packs/day for 14 years    Types: Cigarettes, E-cigarettes  . Smokeless tobacco: Current User  .  Alcohol Use: No    No Known Allergies  Current Outpatient Prescriptions  Medication Sig Dispense Refill  . Armodafinil (NUVIGIL) 250 MG tablet Take 125 mg by mouth daily.      . clonazePAM (KLONOPIN) 1 MG tablet Take 1-2 mg by mouth at bedtime. Anxiety      . ibuprofen (ADVIL,MOTRIN) 800 MG tablet       . lisinopril (PRINIVIL,ZESTRIL) 20 MG tablet       . lithium carbonate 300 MG capsule Take 600 mg by mouth at bedtime.       Marland Kitchen OLANZapine (ZYPREXA) 10 MG tablet       . QUEtiapine (SEROQUEL XR) 400 MG 24 hr tablet Take 800 mg by mouth at bedtime.      . valACYclovir (VALTREX)  500 MG tablet       . zolpidem (AMBIEN) 10 MG tablet Take 15 mg by mouth at bedtime as needed for sleep.       No current facility-administered medications for this visit.    Review of Systems Review of Systems  Constitutional: Positive for unexpected weight change (increased weight recently). Negative for fever and chills.  HENT: Negative.   Eyes: Negative.   Respiratory: Positive for shortness of breath.   Cardiovascular: Negative for chest pain.  Gastrointestinal:       Feels some flipping of something on her right side when she gets up from toilet  All other systems reviewed and are negative.    Blood pressure 130/84, pulse 120, temperature 97.8 F (36.6 C), temperature source Oral, resp. rate 16, height 5\' 8"  (1.727 m), weight 318 lb 3.2 oz (144.335 kg).  Physical Exam Physical Exam  Constitutional: She is oriented to person, place, and time.  Morbidly obese  HENT:  Head: Normocephalic and atraumatic.  Eyes: Conjunctivae and EOM are normal. Pupils are equal, round, and reactive to light.  Neck: Normal range of motion. Neck supple.  Cardiovascular: Regular rhythm and normal heart sounds.  Tachycardia present.   No murmur heard. Pulmonary/Chest: Effort normal. She has no wheezes. She has no rales. She exhibits no tenderness.  Abdominal: Soft. Bowel sounds are normal. There is no tenderness.    Musculoskeletal: Normal range of motion.  Neurological: She is alert and oriented to person, place, and time.  Skin: Skin is warm and dry.  Psychiatric: Her behavior is normal. Her mood appears anxious. Her speech is rapid and/or pressured.    Data Reviewed The notes from the patient's primary care physician have been reviewed. There is minimal inflammation there appeared to report from her ultrasound is included which shows that she does have gallstones which he had back in 2012.  Assessment    To determine whether or not her gallstones were the cause of her current symptoms  we need to get high this can as she had done back in 2012. At that time she had an ejection fraction of 68%. He does not mention as to whether or not the patient has symptoms at the time of her cholecystokinin injection. That will be part of what I would be looking for this current study that is ordered.  Also because of the patient's multiple previous abdominal surgeries a CT scan of the abdomen and pelvis will be helpful to determine if any recurrent hernias are a concern or problem. Either without significant hernias a laparoscopic procedure this patient because of her obesity and her multiple previous operations may be contraindicated.     Plan    #1.  HIDA scan ejection fraction  #2 CT scan of the abdomen and pelvis to determine the intra-abdominal and abdominal wall anatomy.  I will see the patient back in 2-3 weeks.        Gwenyth Ober 04/25/2013, 9:53 AM

## 2013-04-26 ENCOUNTER — Other Ambulatory Visit: Payer: Self-pay | Admitting: Adult Health

## 2013-04-27 ENCOUNTER — Telehealth (INDEPENDENT_AMBULATORY_CARE_PROVIDER_SITE_OTHER): Payer: Self-pay | Admitting: *Deleted

## 2013-04-27 NOTE — Telephone Encounter (Signed)
Patient called me back stating that she does not want to have these studies done because she can deal with her aches and pains and believes it because she is overweight.  She is also not wanting to pick up additional radiology bills.  She also states she is wanting to explore her options with having bariatric surgery.  She called up to our office and spoke with someone else who scheduled her for a bariatric class.  She has a follow up appt with Dr. Hulen Skains on 3/17 to go over test results.  I informed him that I would make Dr. Hulen Skains aware.  Please address.

## 2013-04-27 NOTE — Telephone Encounter (Signed)
LMOM for pt to return my call.  I was calling to inform her of the appt for her HIDA scan at [facility] on 05/01/13 with an arrival time of 9:45am, pt must be NPO after midnight and hold all pain medications.  Pt is also scheduled for her CT scan at [facility] on 05/02/13 with an arrival time of 7:45am.  Pt should pick her contrast up from [facility] on Monday 05/01/13 while she is there for her HIDA scan.

## 2013-05-01 ENCOUNTER — Encounter (HOSPITAL_COMMUNITY): Payer: Medicare Other

## 2013-05-01 NOTE — Telephone Encounter (Signed)
LMOM> cancelled appt for follow up with Dr Hulen Skains since pt not wanting to explore GB surgery at this time.

## 2013-05-02 ENCOUNTER — Ambulatory Visit (HOSPITAL_COMMUNITY): Payer: Medicare Other

## 2013-05-09 ENCOUNTER — Encounter (INDEPENDENT_AMBULATORY_CARE_PROVIDER_SITE_OTHER): Payer: Medicare Other | Admitting: General Surgery

## 2013-05-11 ENCOUNTER — Ambulatory Visit (INDEPENDENT_AMBULATORY_CARE_PROVIDER_SITE_OTHER): Payer: Medicare Other | Admitting: General Surgery

## 2013-05-11 ENCOUNTER — Encounter (INDEPENDENT_AMBULATORY_CARE_PROVIDER_SITE_OTHER): Payer: Self-pay | Admitting: General Surgery

## 2013-05-11 VITALS — BP 128/74 | HR 72 | Temp 97.4°F | Resp 16 | Ht 68.0 in | Wt 315.4 lb

## 2013-05-11 DIAGNOSIS — Z6841 Body Mass Index (BMI) 40.0 and over, adult: Secondary | ICD-10-CM | POA: Insufficient documentation

## 2013-05-11 NOTE — Progress Notes (Addendum)
Patient ID: [name], female   DOB: October 10, 1975, 38 y.o.   MRN: [id number]  Chief Complaint  Patient presents with  . Obesity    New bari gastric sleeve    HPI [name] is a 38 y.o. female.   HPI 38 yo morbidly obese WF referred by Dr Elsie Lincoln for evaluation of weight loss surgery.  The patient states that she has trouble with her weight for many years. She's interested in weight loss surgery at this time because of the physical limitations her weight is causing her. It is not allowing her to be active with her 75-year-old son. She also is on blood pressure medication and gets out of breath easily. She's also starting to have musca skeletal issues requiring corticosteroid injections. She is most interested in having to sleeve gastrectomy.  Despite numerous attempts for sustained weight loss she has been unsuccessful. She has tried phentermine on 4 different occasions. The most recent 2 attempts have been unsuccessful. Her most successful phentermine attempt was on her first trial of it when she lost 60 pounds. She's also tried low-calorie diets on numerous occasions but also those failed as well.  She recently saw my partners for intermittent upper abdominal pain and she is in the middle of getting her gallbladder evaluated by him. She has had ultrasounds in the past which demonstrated fatty liver and cholelithiasis. She had a remote nuclear medicine scan of her gallbladder several years ago which are below normal gallbladder ejection fraction but did not comment on whether or not she had symptoms during CCK injection. She describes her upper abdominal pain is intermittent. It is more than 8. Primarily on her right side underneath her right rib cage.  She does have extensive abdominal surgery history. She had perforated diverticulitis around 2004 requiring urgent colectomy with colostomy. She subsequently underwent reversal. Apparently she developed a ventral hernia which required  open hernia repair at [facility]. This is complicated by a seroma that had to be drained at [facility]. I do not have any of these records.  Also in the electronic medical record is mention of an upper endoscopy in 2012. I do not have the hard copy other than a hand written note which mentions dilation of her esophagus.  The patient is currently on disability due to her bipolar disorder. She was a formal Art therapist. Past Medical History  Diagnosis Date  . GERD (gastroesophageal reflux disease)   . Diverticulitis 2008    perforated/ requiring resection  . Colostomy in place   . Bipolar affective   . Depression   . Anxiety   . Asthma   . Arthritis   . Blood transfusion without reported diagnosis   . Substance abuse     12 years ago-crack cocaine    Past Surgical History  Procedure Laterality Date  . Exploratory laparotomy with resection  2008  . Colostomy reversal  11/2006  . Abdominal wall hernia  07/2007    open repair with lysis of adhesions  . Hx of abd wall seroma  08/2007    Drained via [place] in [place], [place]  . Hx of abd wall seroma  10/2007    Drained by Dr. Geroge Baseman in office  . Tonsillectomy    . Kidney stones    . Hernia repair    . Examination under anesthesia N/A 05/11/2012    Procedure: EXAM UNDER ANESTHESIA;  Surgeon: Donato Heinz, MD;  Location: [facility];  Service: General;  Laterality: N/A;  .  Placement of seton N/A 05/11/2012    Procedure: PLACEMENT OF SETON;  Surgeon: Donato Heinz, MD;  Location: [facility];  Service: General;  Laterality: N/A;  . Treatment fistula anal    . Finger closed reduction Right 08/05/2012    Procedure: CLOSED REDUCTION RIGHT THUMB (FINGER);  Surgeon: Linna Hoff, MD;  Location: [facility];  Service: Orthopedics;  Laterality: Right;  . Esophagogastroduodenoscopy  03/13/2010    [id number] esophagus, status post passage of a Maloney dilator/Small hiatal hernia/ Antral erosions, status post biopsy  . Colonoscopy  03/13/2010     [id number] hemorrhoids likely cause of hematochezia, otherwise normal    Family History  Problem Relation Age of Onset  . Asthma Maternal Grandmother     Social History History  Substance Use Topics  . Smoking status: Current Every Day Smoker -- 0.50 packs/day for 14 years    Types: Cigarettes, E-cigarettes  . Smokeless tobacco: Current User  . Alcohol Use: No    No Known Allergies  Current Outpatient Prescriptions  Medication Sig Dispense Refill  . Armodafinil (NUVIGIL) 250 MG tablet Take 125 mg by mouth daily.      . clonazePAM (KLONOPIN) 1 MG tablet Take 1-2 mg by mouth at bedtime. Anxiety      . ibuprofen (ADVIL,MOTRIN) 800 MG tablet       . lisinopril (PRINIVIL,ZESTRIL) 20 MG tablet       . lithium carbonate 300 MG capsule Take 600 mg by mouth at bedtime.       Marland Kitchen OLANZapine (ZYPREXA) 10 MG tablet       . QUEtiapine (SEROQUEL XR) 400 MG 24 hr tablet Take 800 mg by mouth at bedtime.      . valACYclovir (VALTREX) 500 MG tablet       . zolpidem (AMBIEN) 10 MG tablet Take 15 mg by mouth at bedtime as needed for sleep.       No current facility-administered medications for this visit.    Review of Systems Review of Systems  Constitutional: Negative for fever, activity change, appetite change and unexpected weight change.       Smoked since age 53; no longer use cig; use E-cig since 7/14. Refills cartridges bid. Denies drugs/etoh  HENT: Negative for nosebleeds and trouble swallowing.   Eyes: Negative for photophobia and visual disturbance.  Respiratory: Negative for chest tightness and shortness of breath.   Cardiovascular: Negative for chest pain and leg swelling.       Denies CP, SOB, orthopnea, PND, positive DOE; some ankle edema at times  Gastrointestinal: Negative for vomiting, diarrhea and constipation.       H/o intermittent RUQ abd pain - ache. Last episode 2 months ago, denies n/v with episodes; if takes Mobic will cause sharp epigastric pain radiating to back;  had EGD/colonoscopy in 2012 at [facility]; h/o perf diverticulitis with Hartman's procedure in 2004; subsequent reversal and later open VHR at [facility] in [facility] by Dr Joellen Jersey; h/o of anal fistula s/p seton, no active perianal issues.  Will get reflux if take mobic  Genitourinary: Negative for dysuria and difficulty urinating.       G2P2; has IUD  Musculoskeletal: Negative for arthralgias.       C/o L knee pain; and b/l feet pain - has had steroid injections in feet which have helped. Takes 800 motrin bid  Skin: Negative for pallor and rash.  Neurological: Negative for dizziness, seizures, facial asymmetry and numbness.       Denies  TIA and amaurosis fugax   Hematological: Negative for adenopathy. Does not bruise/bleed easily.  Psychiatric/Behavioral: Positive for behavioral problems. Negative for agitation.       +bipolar; takes clonazepam, zyprexa, seroquel, lithium. Sees Dr Reginia Forts at [place] - no psych hospitalizations in past 1.5 yrs, done well in therapy and med compliance    Blood pressure 128/74, pulse 72, temperature 97.4 F (36.3 C), temperature source Temporal, resp. rate 16, height 5\' 8"  (1.727 m), weight 315 lb 6.4 oz (143.065 kg).  Physical Exam Physical Exam  Vitals reviewed. Constitutional: She is oriented to person, place, and time. She appears well-developed and well-nourished. No distress.  Apple shaped  HENT:  Head: Normocephalic and atraumatic.  Right Ear: External ear normal.  Left Ear: External ear normal.  Eyes: Conjunctivae are normal. No scleral icterus.  Neck: Normal range of motion. Neck supple. No tracheal deviation present. No thyromegaly present.  Cardiovascular: Normal rate, normal heart sounds and intact distal pulses.   Pulmonary/Chest: Effort normal and breath sounds normal. No respiratory distress. She has no wheezes.  Abdominal: Soft. She exhibits no distension. There is no tenderness. There is  no rebound and no guarding.    Long wide midline scar; no obvious hernia. Old LLQ colostomy scar. Obese  Musculoskeletal: Normal range of motion. She exhibits no edema and no tenderness.  Lymphadenopathy:    She has no cervical adenopathy.  Neurological: She is alert and oriented to person, place, and time. She exhibits normal muscle tone.  Skin: Skin is warm and dry. No rash noted. She is not diaphoretic. No erythema. No pallor.  Psychiatric: She has a normal mood and affect. Her behavior is normal. Judgment and thought content normal.    Data Reviewed Dr Richarda Blade office note Hand written EGD report 02/2010 - Dr Gala Romney Barium Swallow 07/2011 - normal esophageal distention and motility, no ulceration, no mass/stricture/filling defect; had episode of pronounced coughing when swallowing water Abd u/s 2013 - +GS, fatty liver CT pelvis 03/2012  Assessment    Morbid obesity BMI 47.9 Bipolar disorder Hypertension Gastroesophageal reflux disease Left knee pain Bilateral feet pain Nicotine use Cholelithiasis Fatty liver Elevated Epworth Sleepiness Scale score 15     Plan    Technically she meets criteria for weight loss surgery however I am not sure if she would be a good candidate as I explained to her. I explained before I come to any formal recommendation that we must get her outside medical records. I need to see exactly what they found on her upper endoscopy and whether or not she required dilatation. If she has any intrinsic distal esophageal issue a sleeve gastrectomy may worsen this. Moreover we also need to get a copy of her abdominal surgery records from [facility] in [facility].   We also discussed her use of E-cigarettes and high dosage of daily NSAIDS - We explained this to be contraindicated after surgery. We discussed the use of NSAIDs in association with marginal ulcers after weight-loss surgery. Her current daily dosage is quite high and I counseled her  about the risk of daily use of that amount of NSAIDs. I also explained that we currently do not offer weight-loss surgery to patients who are actively smoking whether it be tobacco or E-cigarettes. She states that she did stop smoking she did so for 2 weeks last year. I encouraged her to do so. I explained if she's making such as step forward and gaining control her  on her health, she might as well stop smoking as well.  Her elevated Epworth sleepiness scale of 15Suggest the possibility of obstructive sleep apnea. But before we start ordering our standard workup for weight loss surgery, I recommended that I review her outside records. Once I have obtained all her outside records I will contact her and we will go from there.  If She ultimately is felt to be a candidate for weight loss surgery And is cleared for surgery that it is possible to do same day cholecystectomy if needed  Addendum 05/11/2013 7:15 PM   Upper endoscopy report from 03/13/2010 by Dr. Gala Romney - EGD revealed no mucosal abnormalities of the esophagus, EG junction easily transversed. Retroflex view of the stomach showed multiple antral erosions and a small hiatal hernia near the esophagogastric junction. Pylorus easily traversed. Proximal duodenum normal. Passed a 56 [place] Maloney dilator. Colonoscopy-external hemorrhoids, surgical anastomosis 10 cm from the anal verge, pancolonic diverticula Stomach biopsies showed mild reactive gastropathy. Negative H. Pylori  History and physical from July 2008 at [facility]. Hospital Admission for recurrent sigmoid diverticulitis. Patient had just been hospitalized from June 28 2 08/24/2006 with diverticulitis  Still missing operative notes from hernia repair  Leighton Ruff. Redmond Pulling, MD, FACS General, Bariatric, & Minimally Invasive Surgery [facility], [place]        [facility], [facility], [facility] M 05/11/2013, 9:58 AM

## 2013-05-11 NOTE — Patient Instructions (Signed)
I need to get your records from your endoscopy and Lennox Laity to review. I need to determine what was done during your upper endoscopy.  Once I have reviewed your records, I will contact you with regards of where we go from there. In interim, work on stopping smoking

## 2013-05-16 ENCOUNTER — Telehealth (INDEPENDENT_AMBULATORY_CARE_PROVIDER_SITE_OTHER): Payer: Self-pay | Admitting: General Surgery

## 2013-05-16 NOTE — Telephone Encounter (Signed)
LM OM to let her know that I did received her records for Dr Redmond Pulling to review from her past hernia surgery

## 2013-05-16 NOTE — Telephone Encounter (Signed)
Message copied by Maryclare Bean on Tue May 16, 2013  1:12 PM ------      Message from: [place], [place]: Tue May 16, 2013 11:57 AM      Contact: [phone]       SHE IS CALLING TO SEE OF U GOT HER RECORDS THAT DR.WILSON NEED  ------

## 2013-05-16 NOTE — Telephone Encounter (Signed)
[name] called and she wants you to call her back and let her know what, kind of surgery that she can have. I told her that the information came over last week and Dr Redmond Pulling was out of the office and he will be in clinic tomorrow and he will review the record that we will call her, and I stated to her that it will be after clinic tomorrow before he will call her. Her call her back on [phone]

## 2013-05-17 ENCOUNTER — Other Ambulatory Visit (INDEPENDENT_AMBULATORY_CARE_PROVIDER_SITE_OTHER): Payer: Self-pay | Admitting: General Surgery

## 2013-05-17 ENCOUNTER — Telehealth (INDEPENDENT_AMBULATORY_CARE_PROVIDER_SITE_OTHER): Payer: Self-pay | Admitting: *Deleted

## 2013-05-17 NOTE — Telephone Encounter (Signed)
Called patient today and told her what Dr Redmond Pulling seen in her old op notes and she has no mesh from her hernia site and , he wants me to schedule her test for her and I will give everything to [place] and she will give her a call and get her schedule for the test.

## 2013-05-17 NOTE — Telephone Encounter (Signed)
Pt called back for some instructions. Said [name] left her a message for her to call Deneise Lever for instructions.  Please call her back. Thanks

## 2013-05-17 NOTE — Telephone Encounter (Signed)
Left VM. Unable to reach pt.   Reviewed outside records. She had no mesh used in hernia repair. Her esophagus was dilated but didn't really mention any stricture or narrowing. Therefore I think we can start with workup and a sleeve would probably work - but will know for sure after she gets Upper Gi  Shell need : Cmet Cbc Lipid panel H pylori  Upper GI Chest xray EKG  Nutrition consult -sleeve Psych consult Sleep study

## 2013-05-18 ENCOUNTER — Encounter (INDEPENDENT_AMBULATORY_CARE_PROVIDER_SITE_OTHER): Payer: Self-pay

## 2013-05-23 DIAGNOSIS — F3164 Bipolar disorder, current episode mixed, severe, with psychotic features: Secondary | ICD-10-CM | POA: Diagnosis not present

## 2013-05-24 ENCOUNTER — Encounter (INDEPENDENT_AMBULATORY_CARE_PROVIDER_SITE_OTHER): Payer: Self-pay

## 2013-05-26 ENCOUNTER — Encounter (INDEPENDENT_AMBULATORY_CARE_PROVIDER_SITE_OTHER): Payer: Self-pay

## 2013-05-26 ENCOUNTER — Ambulatory Visit (HOSPITAL_COMMUNITY)
Admission: RE | Admit: 2013-05-26 | Discharge: 2013-05-26 | Disposition: A | Discharge status: Home / Self Care | Payer: Medicare Other | Source: Ambulatory Visit | Attending: General Surgery | Admitting: General Surgery

## 2013-05-26 ENCOUNTER — Other Ambulatory Visit: Payer: Self-pay

## 2013-05-26 DIAGNOSIS — Z6841 Body Mass Index (BMI) 40.0 and over, adult: Secondary | ICD-10-CM | POA: Diagnosis not present

## 2013-05-26 DIAGNOSIS — I10 Essential (primary) hypertension: Secondary | ICD-10-CM | POA: Diagnosis not present

## 2013-05-26 DIAGNOSIS — F329 Major depressive disorder, single episode, unspecified: Secondary | ICD-10-CM | POA: Diagnosis not present

## 2013-05-26 DIAGNOSIS — K219 Gastro-esophageal reflux disease without esophagitis: Secondary | ICD-10-CM | POA: Insufficient documentation

## 2013-05-26 DIAGNOSIS — F3289 Other specified depressive episodes: Secondary | ICD-10-CM | POA: Diagnosis not present

## 2013-05-26 DIAGNOSIS — J45909 Unspecified asthma, uncomplicated: Secondary | ICD-10-CM | POA: Diagnosis not present

## 2013-05-26 DIAGNOSIS — Z01818 Encounter for other preprocedural examination: Secondary | ICD-10-CM | POA: Diagnosis not present

## 2013-06-05 ENCOUNTER — Other Ambulatory Visit (INDEPENDENT_AMBULATORY_CARE_PROVIDER_SITE_OTHER): Payer: Self-pay | Admitting: General Surgery

## 2013-06-25 ENCOUNTER — Ambulatory Visit (HOSPITAL_BASED_OUTPATIENT_CLINIC_OR_DEPARTMENT_OTHER): Payer: Medicare Other | Attending: General Surgery

## 2013-06-26 ENCOUNTER — Encounter: Payer: Self-pay | Admitting: Dietician

## 2013-06-26 ENCOUNTER — Encounter: Payer: Medicare Other | Attending: General Surgery | Admitting: Dietician

## 2013-06-26 VITALS — Ht 68.0 in | Wt 314.5 lb

## 2013-06-26 DIAGNOSIS — Z01818 Encounter for other preprocedural examination: Secondary | ICD-10-CM | POA: Insufficient documentation

## 2013-06-26 DIAGNOSIS — Z6841 Body Mass Index (BMI) 40.0 and over, adult: Secondary | ICD-10-CM | POA: Insufficient documentation

## 2013-06-26 DIAGNOSIS — Z713 Dietary counseling and surveillance: Secondary | ICD-10-CM | POA: Diagnosis not present

## 2013-06-26 NOTE — Progress Notes (Signed)
  Pre-Op Assessment Visit:  Pre-Operative Gastric sleeve Surgery  Medical Nutrition Therapy:  Appt start time: 0737   End time:  1700.  Patient was seen on 06/26/2013 for Pre-Operative Gastric sleeve Nutrition Assessment. Assessment and letter of approval faxed to [facility] Program coordinator on 06/26/2013.   Preferred Learning Style:   No preference indicated   Learning Readiness:  Contemplating  Ready  Handouts given during visit include:  Pre-Op Goals Bariatric Surgery Protein Shakes  Teaching Method Utilized:  Visual Auditory Hands on  Barriers to learning/adherence to lifestyle change: possible mental deficit  Demonstrated degree of understanding via:  Teach Back   [facility] to call the [facility] to enroll in Pre-Op and Post-Op Nutrition Education when surgery date is scheduled.

## 2013-06-29 ENCOUNTER — Ambulatory Visit (HOSPITAL_BASED_OUTPATIENT_CLINIC_OR_DEPARTMENT_OTHER): Payer: Medicare Other | Attending: General Surgery | Admitting: Radiology

## 2013-06-29 VITALS — Ht 68.0 in

## 2013-06-29 DIAGNOSIS — G4733 Obstructive sleep apnea (adult) (pediatric): Secondary | ICD-10-CM | POA: Diagnosis not present

## 2013-06-29 DIAGNOSIS — G4719 Other hypersomnia: Secondary | ICD-10-CM

## 2013-06-29 DIAGNOSIS — F3160 Bipolar disorder, current episode mixed, unspecified: Secondary | ICD-10-CM | POA: Diagnosis not present

## 2013-06-29 DIAGNOSIS — R0683 Snoring: Secondary | ICD-10-CM

## 2013-07-01 DIAGNOSIS — R0989 Other specified symptoms and signs involving the circulatory and respiratory systems: Secondary | ICD-10-CM | POA: Diagnosis not present

## 2013-07-01 DIAGNOSIS — G4730 Sleep apnea, unspecified: Secondary | ICD-10-CM | POA: Diagnosis not present

## 2013-07-01 DIAGNOSIS — R0609 Other forms of dyspnea: Secondary | ICD-10-CM

## 2013-07-01 DIAGNOSIS — G4710 Hypersomnia, unspecified: Secondary | ICD-10-CM

## 2013-07-01 NOTE — Sleep Study (Signed)
   NAME: [name] DATE OF BIRTH:  Nov 28, 1975 MEDICAL RECORD NUMBER [id number]  LOCATION: [facility]  PHYSICIAN: Sharalee Witman D Iwao Shamblin  DATE OF STUDY: 06/29/2013  SLEEP STUDY TYPE: Nocturnal Polysomnogram               REFERRING PHYSICIAN: [name], MD  INDICATION FOR STUDY: Hypersomnia with sleep apnea  EPWORTH SLEEPINESS SCORE:   9/24 HEIGHT: 5\' 8"  (172.7 cm)  WEIGHT:  (314lbs)    Body mass index is 0.00 kg/(m^2).  NECK SIZE: 19 in.  MEDICATIONS: Charted for review  SLEEP ARCHITECTURE: Total sleep time 331.5 minutes with sleep efficiency 90.8%. Stage I was 4.8%, stage II 90.5%, stage III absent, REM 4.7% of total sleep time. Sleep latency 0, REM latency 188 minutes. Awake after sleep onset 29 minutes, arousal index 5.6. Bedtime medication: Ambien, Seroquel, lithium, Zyprexa  RESPIRATORY DATA: Apnea hypopnea index (AHI) 5.1 per hour. 28 total events scored, all as hypopneas and mostly while supine. REM AHI 50.3 per hour. CPAP titration was not requested.  OXYGEN DATA: Very loud snoring with oxygen desaturation to a nadir of 60% and mean oxygen saturation through the study of 91.3% on room air.  CARDIAC DATA: Normal sinus rhythm with mean heart rate 107.4.  MOVEMENT/PARASOMNIA: No significant movement disturbance, no bathroom trips  IMPRESSION/ RECOMMENDATION:   1) Sleep architecture reflected significant bedtime medications noted above, with predominance of stage II sleep. 2) Minimal obstructive sleep apnea/hypopnea syndrome, AHI 5.1 per hour with most events associated with supine sleep position and REM. REM AHI 50.3 per hour. The normal range for adults as an AHI from 0-5 per hour. 3) Very loud snoring was noted with oxygen desaturation to a nadir of 60% and mean oxygen saturation through the study of 91.3% on room air.   Signed Baird Lyons M.D. [place], Tax adviser of Sleep Medicine  ELECTRONICALLY SIGNED ON:  07/01/2013,  12:55 PM [place] PH: [phone]   FX: [phone]

## 2013-07-21 ENCOUNTER — Ambulatory Visit: Payer: Medicare Other | Admitting: Dietician

## 2013-08-02 ENCOUNTER — Ambulatory Visit: Payer: Medicare Other | Admitting: Dietician

## 2013-08-02 ENCOUNTER — Encounter: Payer: Medicare Other | Attending: General Surgery | Admitting: Dietician

## 2013-08-02 VITALS — Ht 68.0 in | Wt 317.1 lb

## 2013-08-02 DIAGNOSIS — Z713 Dietary counseling and surveillance: Secondary | ICD-10-CM | POA: Diagnosis not present

## 2013-08-02 DIAGNOSIS — Z01818 Encounter for other preprocedural examination: Secondary | ICD-10-CM | POA: Diagnosis not present

## 2013-08-02 DIAGNOSIS — Z6841 Body Mass Index (BMI) 40.0 and over, adult: Secondary | ICD-10-CM | POA: Diagnosis not present

## 2013-08-02 LAB — CBC
HCT: 35.9 % — ABNORMAL LOW (ref 36.0–46.0)
Hemoglobin: 11.8 g/dL — ABNORMAL LOW (ref 12.0–15.0)
MCH: 27.6 pg (ref 26.0–34.0)
MCHC: 32.9 g/dL (ref 30.0–36.0)
MCV: 84.1 fL (ref 78.0–100.0)
Platelets: 213 10*3/uL (ref 150–400)
RBC: 4.27 MIL/uL (ref 3.87–5.11)
RDW: 14.3 % (ref 11.5–15.5)
WBC: 5.9 10*3/uL (ref 4.0–10.5)

## 2013-08-02 LAB — LIPID PANEL
Cholesterol: 165 mg/dL (ref 0–200)
HDL: 37 mg/dL — ABNORMAL LOW (ref >39)
LDL Cholesterol: 95 mg/dL (ref 0–99)
Total CHOL/HDL Ratio: 4.5 Ratio
Triglycerides: 166 mg/dL — ABNORMAL HIGH (ref <150)
VLDL: 33 mg/dL (ref 0–40)

## 2013-08-02 NOTE — Patient Instructions (Signed)
-  Keep working on pre op goals  -Lose 2 pounds

## 2013-08-02 NOTE — Progress Notes (Signed)
  Medical Nutrition Therapy:  Appt start time: 1200 end time:  1215.  Assessment:  Primary concerns today: SWL visit #1  [name] is here today having gained 2.5 lbs since her assessment. She has been trying to drink more water and eat more salads. She also reports trying to chew her food more thoroughly and have less caffeine in preparation for bariatric surgery. [name] cares for her grandmother and she has been living with her in a rehab facility for the past month. [name] states that her schedule has been hectic, as she has been staying in the nursing home with her grandmother. She has been eating mostly from a vending machine.  MEDICATIONS: see list  DIETARY INTAKE:  24-hr recall:  B (AM): Bacon, egg, and cheese biscuit  Snk (AM) :  L (PM): nabs or oranges  Snk ( PM):  D (PM): hotdog  Snk (PM):  Beverages: water, diet Sundrop  Recent physical activity: none  Estimated energy needs: 1800 calories  Progress Towards Goal(s):  No progress.   Nutritional Diagnosis:  -3.3 Overweight/obesity related to past poor dietary habits and physical inactivity as evidenced by patient in SWL visits for pending gastric sleeve surgery following dietary guidelines for continued weight loss.     Intervention:  Nutrition counseling provided.  Monitoring/Evaluation:  Dietary intake, exercise, and body weight in 4 week(s).

## 2013-08-03 ENCOUNTER — Institutional Professional Consult (permissible substitution): Payer: Medicare Other | Admitting: Internal Medicine

## 2013-08-03 LAB — H. PYLORI ANTIBODY, IGG: H Pylori IgG: <0.4 {ISR}

## 2013-08-16 DIAGNOSIS — F3160 Bipolar disorder, current episode mixed, unspecified: Secondary | ICD-10-CM | POA: Diagnosis not present

## 2013-08-18 DIAGNOSIS — IMO0002 Reserved for concepts with insufficient information to code with codable children: Secondary | ICD-10-CM | POA: Diagnosis not present

## 2013-08-18 DIAGNOSIS — F3175 Bipolar disorder, in partial remission, most recent episode depressed: Secondary | ICD-10-CM | POA: Diagnosis not present

## 2013-08-28 ENCOUNTER — Telehealth (INDEPENDENT_AMBULATORY_CARE_PROVIDER_SITE_OTHER): Payer: Self-pay | Admitting: General Surgery

## 2013-08-28 ENCOUNTER — Other Ambulatory Visit (INDEPENDENT_AMBULATORY_CARE_PROVIDER_SITE_OTHER): Payer: Self-pay | Admitting: General Surgery

## 2013-08-28 DIAGNOSIS — E038 Other specified hypothyroidism: Secondary | ICD-10-CM

## 2013-08-28 NOTE — Telephone Encounter (Signed)
Called patient to let her know that she has to go and get CMET and TSH done this week and she will need to go fasting, she is aware to go and she stated that she will go on Friday when she gets back from vacation

## 2013-09-01 ENCOUNTER — Encounter: Payer: Medicare Other | Attending: General Surgery | Admitting: Dietician

## 2013-09-01 VITALS — Ht 68.0 in | Wt 324.6 lb

## 2013-09-01 DIAGNOSIS — Z6841 Body Mass Index (BMI) 40.0 and over, adult: Secondary | ICD-10-CM | POA: Insufficient documentation

## 2013-09-01 DIAGNOSIS — E038 Other specified hypothyroidism: Secondary | ICD-10-CM | POA: Diagnosis not present

## 2013-09-01 DIAGNOSIS — Z713 Dietary counseling and surveillance: Secondary | ICD-10-CM | POA: Insufficient documentation

## 2013-09-01 DIAGNOSIS — Z01818 Encounter for other preprocedural examination: Secondary | ICD-10-CM | POA: Insufficient documentation

## 2013-09-01 LAB — COMPREHENSIVE METABOLIC PANEL
ALT: 19 U/L (ref 0–35)
AST: 11 U/L (ref 0–37)
Albumin: 4 g/dL (ref 3.5–5.2)
Alkaline Phosphatase: 80 U/L (ref 39–117)
BUN: 9 mg/dL (ref 6–23)
CO2: 29 mEq/L (ref 19–32)
Calcium: 8.9 mg/dL (ref 8.4–10.5)
Chloride: 99 mEq/L (ref 96–112)
Creat: 0.69 mg/dL (ref 0.50–1.10)
Glucose, Bld: 129 mg/dL — ABNORMAL HIGH (ref 70–99)
Potassium: 4.1 mEq/L (ref 3.5–5.3)
Sodium: 136 mEq/L (ref 135–145)
Total Bilirubin: 0.2 mg/dL (ref 0.2–1.2)
Total Protein: 6.2 g/dL (ref 6.0–8.3)

## 2013-09-01 LAB — TSH: TSH: 2.226 u[IU]/mL (ref 0.350–4.500)

## 2013-09-01 NOTE — Progress Notes (Signed)
  Medical Nutrition Therapy:  Appt start time: 830 end time:  845.  Assessment:  Primary concerns today: SWL visit #2  [name] returns today having gained 7 pounds since her last visit. She states that her schedule has calmed down. However, she is still caring for her grandmother, which causes stress. She is still having vegetables and drinking more water but she reports her appetite has increased significantly. "The bigger I get, the hungrier I get." She states that she does not usually skip meals and has stopped drinking Sundrop. [name] reports that she has been trying to chew her food thoroughly in preparation for bariatric surgery.    Wt Readings from Last 3 Encounters:  09/01/13 324 lb 9.6 oz (147.238 kg)  08/02/13 317 lb 1.6 oz (143.836 kg)  06/26/13 314 lb 8 oz (142.656 kg)   Ht Readings from Last 3 Encounters:  09/01/13 5\' 8"  (1.727 m)  08/02/13 5\' 8"  (1.727 m)  06/29/13 5\' 8"  (1.727 m)   Body mass index is 49.37 kg/(m^2). @BMIFA @ Normalized weight-for-age data available only for age 79 to 20 years. Normalized stature-for-age data available only for age 79 to 54 years.    MEDICATIONS: see list  DIETARY INTAKE:  24-hr recall:  B (AM): [employer] breakfast sandwich  Snk (AM) : fruit and yogurt L (PM): ham sandwich  Snk ( PM):  D (PM): biscuits, creamed corn, green beans, potatoes Snk (PM):  Beverages: water, coffee  Recent physical activity: none  Estimated energy needs: 1800 calories  Progress Towards Goal(s):  No progress.   Nutritional Diagnosis:  [place]-3.3 Overweight/obesity related to past poor dietary habits and physical inactivity as evidenced by patient in SWL visits for pending gastric sleeve surgery following dietary guidelines for continued weight loss.     Intervention:  Nutrition counseling provided.  Monitoring/Evaluation:  Dietary intake, exercise, and body weight in 4 week(s).

## 2013-09-01 NOTE — Patient Instructions (Addendum)
-  Increase physical activity  -Exercise bike  -Goal: lose 2 pounds in the next month

## 2013-09-04 ENCOUNTER — Ambulatory Visit: Payer: Medicare Other | Admitting: Dietician

## 2013-09-18 DIAGNOSIS — F312 Bipolar disorder, current episode manic severe with psychotic features: Secondary | ICD-10-CM | POA: Diagnosis not present

## 2013-10-02 ENCOUNTER — Ambulatory Visit: Payer: Medicare Other | Admitting: Dietician

## 2013-10-02 ENCOUNTER — Encounter: Payer: Medicare Other | Attending: General Surgery | Admitting: Dietician

## 2013-10-02 VITALS — Ht 68.0 in | Wt 314.0 lb

## 2013-10-02 DIAGNOSIS — Z713 Dietary counseling and surveillance: Secondary | ICD-10-CM | POA: Diagnosis not present

## 2013-10-02 DIAGNOSIS — Z01818 Encounter for other preprocedural examination: Secondary | ICD-10-CM | POA: Diagnosis not present

## 2013-10-02 DIAGNOSIS — Z6841 Body Mass Index (BMI) 40.0 and over, adult: Secondary | ICD-10-CM | POA: Insufficient documentation

## 2013-10-02 NOTE — Progress Notes (Signed)
  Medical Nutrition Therapy:  Appt start time: 830 end time:  845.  Assessment:  Primary concerns today: SWL visit #3  [name] returns today having lost 10 pounds in the last month. She reports drinking more water and having protein shakes to replace breakfast. Having high-protein cereal with nonfat milk. Avoiding breads and limiting soda. Drinking diet sodas instead of regular, max 2 a day. Used to have regular Sundrops or Coke "all day long." No organized exercise routine but still caring for grandmother and son and cleaning house.    Wt Readings from Last 3 Encounters:  10/02/13 314 lb (142.429 kg)  09/01/13 324 lb 9.6 oz (147.238 kg)  08/02/13 317 lb 1.6 oz (143.836 kg)   Ht Readings from Last 3 Encounters:  09/01/13 5\' 8"  (1.727 m)  08/02/13 5\' 8"  (1.727 m)  06/29/13 5\' 8"  (1.727 m)   There is no weight on file to calculate BMI. @BMIFA @ Normalized weight-for-age data available only for age 53 to 74 years. Normalized stature-for-age data available only for age 53 to 33 years.   MEDICATIONS: see list  DIETARY INTAKE:  24-hr recall:  B (AM): 12 oz protein shake and coffee with Splenda OR Special K cereal with nonfat milk  Snk (AM) : L (PM): 2 chicken snack wraps from [employer] Snk ( PM):  D (PM): salad, sometimes skips Snk (PM):  Beverages: water, coffee, diet soda, protein shakes  Recent physical activity: none  Estimated energy needs: 1800 calories  Progress Towards Goal(s):  No progress.   Nutritional Diagnosis:  [place]-3.3 Overweight/obesity related to past poor dietary habits and physical inactivity as evidenced by patient in SWL visits for pending gastric sleeve surgery following dietary guidelines for continued weight loss.     Intervention:  Nutrition counseling provided.  Monitoring/Evaluation:  Dietary intake, exercise, and body weight in 4 week(s).

## 2013-10-02 NOTE — Patient Instructions (Addendum)
-  Increase physical activity  -Exercise bike  -Goal: lose 8 pounds by next SWL visit  -Practice sipping fluids instead of gulping  -Avoid skipping meals  -Start weaning yourself off of caffeine

## 2013-10-06 DIAGNOSIS — K219 Gastro-esophageal reflux disease without esophagitis: Secondary | ICD-10-CM | POA: Diagnosis not present

## 2013-10-06 DIAGNOSIS — Z6841 Body Mass Index (BMI) 40.0 and over, adult: Secondary | ICD-10-CM | POA: Diagnosis not present

## 2013-10-06 DIAGNOSIS — M549 Dorsalgia, unspecified: Secondary | ICD-10-CM | POA: Diagnosis not present

## 2013-10-06 DIAGNOSIS — I10 Essential (primary) hypertension: Secondary | ICD-10-CM | POA: Diagnosis not present

## 2013-10-11 DIAGNOSIS — F3160 Bipolar disorder, current episode mixed, unspecified: Secondary | ICD-10-CM | POA: Diagnosis not present

## 2013-10-17 DIAGNOSIS — F319 Bipolar disorder, unspecified: Secondary | ICD-10-CM | POA: Diagnosis not present

## 2013-10-25 DIAGNOSIS — F3160 Bipolar disorder, current episode mixed, unspecified: Secondary | ICD-10-CM | POA: Diagnosis not present

## 2013-11-02 ENCOUNTER — Ambulatory Visit: Payer: Medicare Other | Admitting: Dietician

## 2013-11-06 ENCOUNTER — Encounter: Payer: Medicare Other | Attending: General Surgery | Admitting: Dietician

## 2013-11-06 VITALS — Wt 315.0 lb

## 2013-11-06 DIAGNOSIS — Z6841 Body Mass Index (BMI) 40.0 and over, adult: Secondary | ICD-10-CM | POA: Diagnosis not present

## 2013-11-06 DIAGNOSIS — Z713 Dietary counseling and surveillance: Secondary | ICD-10-CM | POA: Insufficient documentation

## 2013-11-06 DIAGNOSIS — Z01818 Encounter for other preprocedural examination: Secondary | ICD-10-CM | POA: Diagnosis not present

## 2013-11-06 NOTE — Progress Notes (Signed)
  Medical Nutrition Therapy:  Appt start time: 1020 end time:  1035.  Assessment:  Primary concerns today: SWL visit #4  [place] returns today having gained 1 pound. She reports that she is still avoiding sugary drinks and having only diet soda. Trying not to eat too much throughout the day and at night. She does not weigh herself at home. Lavena is beginning to recognize that having one sweet thing (or trigger food) may make her go overboard.    Wt Readings from Last 3 Encounters:  11/06/13 315 lb (142.883 kg)  10/02/13 314 lb (142.429 kg)  09/01/13 324 lb 9.6 oz (147.238 kg)   Ht Readings from Last 3 Encounters:  10/02/13 5\' 8"  (1.727 m)  09/01/13 5\' 8"  (1.727 m)  08/02/13 5\' 8"  (1.727 m)   There is no weight on file to calculate BMI. @BMIFA @ Normalized weight-for-age data available only for age 81 to 15 years. Normalized stature-for-age data available only for age 81 to 72 years.   MEDICATIONS: see list  DIETARY INTAKE:  24-hr recall:  B (AM): 12 oz whey isopure protein shake and coffee with Splenda  Snk (AM) : L (PM): yesterday had beef roast, potatoes, green beans, corn bread Snk ( PM): 2 cookies yesterday D (PM): Special K cereal with 1% milk OR protein shake Snk (PM):  Beverages: water, coffee, diet soda, protein shakes  Recent physical activity: none  Estimated energy needs: 1800 calories  Progress Towards Goal(s):  No progress.   Nutritional Diagnosis:  [place]-3.3 Overweight/obesity related to past poor dietary habits and physical inactivity as evidenced by patient in SWL visits for pending gastric sleeve surgery following dietary guidelines for continued weight loss.     Intervention:  Nutrition counseling provided.  Monitoring/Evaluation:  Dietary intake, exercise, and body weight in 4 week(s).

## 2013-11-06 NOTE — Patient Instructions (Addendum)
-  Increase physical activity  -Exercise bike  -Practice sipping fluids instead of gulping  -Avoid skipping meals  -Start weaning yourself off of caffeine  -Work on Educational psychologist foods out of the house -Ask PCP about Chantix

## 2013-12-01 ENCOUNTER — Encounter: Payer: Medicare Other | Attending: General Surgery | Admitting: Dietician

## 2013-12-01 VITALS — Wt 314.0 lb

## 2013-12-01 DIAGNOSIS — F17200 Nicotine dependence, unspecified, uncomplicated: Secondary | ICD-10-CM | POA: Diagnosis not present

## 2013-12-01 DIAGNOSIS — Z713 Dietary counseling and surveillance: Secondary | ICD-10-CM | POA: Insufficient documentation

## 2013-12-01 NOTE — Patient Instructions (Addendum)
-  Increase physical activity  -Exercise bike  -Practice sipping fluids instead of gulping  -Avoid skipping meals  -Start weaning yourself off of caffeine  -Work on Educational psychologist foods out of the house

## 2013-12-01 NOTE — Progress Notes (Signed)
  Medical Nutrition Therapy:  Appt start time: 905 end time:  920  Supervised Weight Loss:  Primary concerns today: SWL visit #5  Jaedah returns for her 5th SWL visit in preparation for gastric sleeve. Pa has lost 1 pound since last visit. She started taking Chantix but is still smoking vaporizers occasionally. Still drinking whey protein shakes.    Wt Readings from Last 3 Encounters:  12/01/13 314 lb (142.429 kg)  11/06/13 315 lb (142.883 kg)  10/02/13 314 lb (142.429 kg)   Ht Readings from Last 3 Encounters:  10/02/13 5\' 8"  (1.727 m)  09/01/13 5\' 8"  (1.727 m)  08/02/13 5\' 8"  (1.727 m)   There is no weight on file to calculate BMI. @BMIFA @ Normalized weight-for-age data available only for age 10 to 40 years. Normalized stature-for-age data available only for age 10 to 35 years.   MEDICATIONS: see list  DIETARY INTAKE:  24-hr recall:  B (AM): 12 oz whey isopure protein shake and coffee with Splenda  Snk (AM) : L (PM): yesterday had beef roast, potatoes, green beans, corn bread Snk ( PM): 2 cookies yesterday D (PM): Special K cereal with 1% milk OR protein shake Snk (PM):  Beverages: water, coffee, diet soda, protein shakes  Recent physical activity: none  Estimated energy needs: 1800 calories  Progress Towards Goal(s):  No progress.   Nutritional Diagnosis:  [place]-3.3 Overweight/obesity related to past poor dietary habits and physical inactivity as evidenced by patient in SWL visits for pending gastric sleeve surgery following dietary guidelines for continued weight loss.     Intervention:  Nutrition counseling provided.  Goal: Stop smoking!! Start by getting a vaporizer with no nicotine.   Monitoring/Evaluation:  Dietary intake, exercise, and body weight in 4 week(s).

## 2013-12-05 DIAGNOSIS — F3160 Bipolar disorder, current episode mixed, unspecified: Secondary | ICD-10-CM | POA: Diagnosis not present

## 2013-12-07 ENCOUNTER — Other Ambulatory Visit (INDEPENDENT_AMBULATORY_CARE_PROVIDER_SITE_OTHER): Payer: Self-pay | Admitting: General Surgery

## 2013-12-12 DIAGNOSIS — F319 Bipolar disorder, unspecified: Secondary | ICD-10-CM | POA: Diagnosis not present

## 2014-01-01 ENCOUNTER — Encounter: Payer: Medicare Other | Attending: General Surgery | Admitting: Dietician

## 2014-01-01 VITALS — Wt 322.0 lb

## 2014-01-01 DIAGNOSIS — Z713 Dietary counseling and surveillance: Secondary | ICD-10-CM | POA: Diagnosis not present

## 2014-01-01 DIAGNOSIS — F17200 Nicotine dependence, unspecified, uncomplicated: Secondary | ICD-10-CM | POA: Insufficient documentation

## 2014-01-01 NOTE — Patient Instructions (Addendum)
-  Increase physical activity  -Exercise bike  -Practice sipping fluids instead of gulping  -Avoid skipping meals  -Start weaning yourself off of caffeine  -Work on Educational psychologist foods out of the house  -Have a sugar free popsicle when you're feeling [employer]

## 2014-01-01 NOTE — Progress Notes (Signed)
  Medical Nutrition Therapy:  Appt start time: 940 end time:  955  Supervised Weight Loss:  Primary concerns today: SWL visit #6  [name] returns for her 6th SWL visit in preparation for gastric sleeve. [name] has gained weight since last visit but stopped smoking! Has not had any nicotine in the last 12 days. Using Chantix. Has been eating more as a result. Trying to chew straws to satisfy hand-to-mouth motion. Working on chewing food thoroughly and drinking Premier protein shakes.   Plan: Find something to do with hands instead of eating (Video games) Have a sugar free popsicle when feeling like eating Try to focus on protein foods (replace 1 meal a day with a protein shake)   Wt Readings from Last 3 Encounters:  01/01/14 322 lb (146.058 kg)  12/01/13 314 lb (142.429 kg)  11/06/13 315 lb (142.883 kg)   Ht Readings from Last 3 Encounters:  10/02/13 5\' 8"  (1.727 m)  09/01/13 5\' 8"  (1.727 m)  08/02/13 5\' 8"  (1.727 m)   There is no weight on file to calculate BMI. @BMIFA @ Normalized weight-for-age data available only for age 77 to 73 years. Normalized stature-for-age data available only for age 77 to 18 years.   MEDICATIONS: see list  DIETARY INTAKE:  24-hr recall:  B (AM): 12 oz whey isopure protein shake and coffee with Splenda  Snk (AM) : L (PM): yesterday had beef roast, potatoes, green beans, corn bread Snk ( PM): 2 cookies yesterday D (PM): Special K cereal with 1% milk OR protein shake Snk (PM):  Beverages: water, coffee, diet soda, protein shakes  Recent physical activity: none  Estimated energy needs: 1800 calories  Progress Towards Goal(s):  No progress.   Nutritional Diagnosis:  [place]-3.3 Overweight/obesity related to past poor dietary habits and physical inactivity as evidenced by patient in SWL visits for pending gastric sleeve surgery following dietary guidelines for continued weight loss.     Intervention:  Nutrition counseling provided.    Monitoring/Evaluation:  Attend pre op class.

## 2014-01-15 ENCOUNTER — Ambulatory Visit: Payer: Medicare Other

## 2014-01-24 DIAGNOSIS — F3160 Bipolar disorder, current episode mixed, unspecified: Secondary | ICD-10-CM | POA: Diagnosis not present

## 2014-01-25 DIAGNOSIS — I10 Essential (primary) hypertension: Secondary | ICD-10-CM | POA: Diagnosis not present

## 2014-01-25 DIAGNOSIS — K219 Gastro-esophageal reflux disease without esophagitis: Secondary | ICD-10-CM | POA: Diagnosis not present

## 2014-01-25 DIAGNOSIS — F319 Bipolar disorder, unspecified: Secondary | ICD-10-CM | POA: Diagnosis not present

## 2014-01-25 DIAGNOSIS — E781 Pure hyperglyceridemia: Secondary | ICD-10-CM | POA: Diagnosis not present

## 2014-01-29 ENCOUNTER — Encounter: Payer: Medicare Other | Attending: General Surgery

## 2014-01-29 VITALS — Ht 68.0 in | Wt 320.0 lb

## 2014-01-29 DIAGNOSIS — Z713 Dietary counseling and surveillance: Secondary | ICD-10-CM | POA: Insufficient documentation

## 2014-01-29 DIAGNOSIS — F17200 Nicotine dependence, unspecified, uncomplicated: Secondary | ICD-10-CM | POA: Diagnosis not present

## 2014-01-29 NOTE — Progress Notes (Signed)
  Pre-Operative Nutrition Class:  Appt start time: 7282   End time:  1830.  Patient was seen on 01/30/2104 for Pre-Operative Bariatric Surgery Education at [facility].   Surgery date: 02/13/2014 Surgery type: Sleeve [facility]: 314 lbs on 06/30/2013 Weight today: 320.0 lbs  TANITA  BODY COMP RESULTS  01/29/14   BMI (kg/m^2) 48.7   Fat Mass (lbs) 171.5   Fat Free Mass (lbs) 148.5   Total Body Water (lbs) 108.5   Samples given per MNT protocol. Patient educated on appropriate usage: Chocolate PB2 Lot #none Exp:01/05/2015  Celebrate Vitamins Multivitamin Grape Lot #[id number] Exp: 02/2014  Bariactiv Calcium Citrate Lot #[id number] Exp:6/16  Rebecka Apley Protein Powder Lot #[id number] Exp: 11/2014  Strawberry Premier Protein Lot #[id number] Exp: 10/14/2014  The following the learning objectives were met by the patient during this course:  Identify Pre-Op Dietary Goals and will begin 2 weeks pre-operatively  Identify appropriate sources of fluids and proteins   State protein recommendations and appropriate sources pre and post-operatively  Identify Post-Operative Dietary Goals and will follow for 2 weeks post-operatively  Identify appropriate multivitamin and calcium sources  Describe the need for physical activity post-operatively and will follow MD recommendations  State when to call healthcare provider regarding medication questions or post-operative complications  Handouts given during class include:  Pre-Op Bariatric Surgery Diet Handout  Protein Shake Handout  Post-Op Bariatric Surgery Nutrition Handout  BELT Program Information Flyer  Support Group Information Flyer  [facility] Bariatric Supplements Price List  Follow-Up Plan: Patient will follow-up at [facility] 2 weeks post operatively for diet advancement per MD.

## 2014-01-29 NOTE — Patient Instructions (Signed)
Follow:   Pre-Op Diet per MD 2 weeks prior to surgery  Phase 2- Liquids (clear/full) 2 weeks after surgery  Vitamin/Mineral/Calcium guidelines for purchasing bariatric supplements  Exercise guidelines pre and post-op per MD  Follow-up at [facility] in 2 weeks post-op for diet advancement. Contact Roxan Hockey or Lajean Saver as needed with questions/concerns.

## 2014-02-08 ENCOUNTER — Encounter (HOSPITAL_COMMUNITY)
Admission: RE | Admit: 2014-02-08 | Discharge: 2014-02-08 | Disposition: A | Discharge status: Home / Self Care | Payer: Medicare Other | Source: Ambulatory Visit | Attending: General Surgery | Admitting: General Surgery

## 2014-02-08 ENCOUNTER — Encounter (HOSPITAL_COMMUNITY): Payer: Self-pay

## 2014-02-08 DIAGNOSIS — Z01812 Encounter for preprocedural laboratory examination: Secondary | ICD-10-CM | POA: Diagnosis not present

## 2014-02-08 DIAGNOSIS — Z6841 Body Mass Index (BMI) 40.0 and over, adult: Secondary | ICD-10-CM | POA: Insufficient documentation

## 2014-02-08 DIAGNOSIS — Z79899 Other long term (current) drug therapy: Secondary | ICD-10-CM | POA: Diagnosis not present

## 2014-02-08 DIAGNOSIS — I10 Essential (primary) hypertension: Secondary | ICD-10-CM | POA: Diagnosis not present

## 2014-02-08 LAB — COMPREHENSIVE METABOLIC PANEL
ALT: 23 U/L (ref 0–35)
AST: 18 U/L (ref 0–37)
Albumin: 4.2 g/dL (ref 3.5–5.2)
Alkaline Phosphatase: 89 U/L (ref 39–117)
Anion gap: 12 (ref 5–15)
BUN: 14 mg/dL (ref 6–23)
CO2: 26 mEq/L (ref 19–32)
Calcium: 9.3 mg/dL (ref 8.4–10.5)
Chloride: 99 mEq/L (ref 96–112)
Creatinine, Ser: 0.61 mg/dL (ref 0.50–1.10)
GFR calc Af Amer: >90 mL/min (ref >90)
GFR calc non Af Amer: >90 mL/min (ref >90)
Glucose, Bld: 77 mg/dL (ref 70–99)
Potassium: 4 mEq/L (ref 3.7–5.3)
Sodium: 137 mEq/L (ref 137–147)
Total Bilirubin: <0.2 mg/dL — ABNORMAL LOW (ref 0.3–1.2)
Total Protein: 7.1 g/dL (ref 6.0–8.3)

## 2014-02-08 LAB — CBC WITH DIFFERENTIAL/PLATELET
Basophils Absolute: 0 10*3/uL (ref 0.0–0.1)
Basophils Relative: 0 % (ref 0–1)
Eosinophils Absolute: 0.3 10*3/uL (ref 0.0–0.7)
Eosinophils Relative: 3 % (ref 0–5)
HCT: 40.4 % (ref 36.0–46.0)
Hemoglobin: 12.6 g/dL (ref 12.0–15.0)
Lymphocytes Relative: 32 % (ref 12–46)
Lymphs Abs: 2.5 10*3/uL (ref 0.7–4.0)
MCH: 27.8 pg (ref 26.0–34.0)
MCHC: 31.2 g/dL (ref 30.0–36.0)
MCV: 89 fL (ref 78.0–100.0)
Monocytes Absolute: 0.5 10*3/uL (ref 0.1–1.0)
Monocytes Relative: 6 % (ref 3–12)
Neutro Abs: 4.6 10*3/uL (ref 1.7–7.7)
Neutrophils Relative %: 59 % (ref 43–77)
Platelets: 259 10*3/uL (ref 150–400)
RBC: 4.54 MIL/uL (ref 3.87–5.11)
RDW: 13.9 % (ref 11.5–15.5)
WBC: 7.9 10*3/uL (ref 4.0–10.5)

## 2014-02-08 LAB — HCG, SERUM, QUALITATIVE: Preg, Serum: NEGATIVE

## 2014-02-08 NOTE — Patient Instructions (Addendum)
[name]  02/08/2014   Your procedure is scheduled on: Tuesday 02/13/2014  Report to [facility]  Entrance and follow signs to               [facility] [place] AM.  Call this number if you have problems the morning of surgery [phone]   Remember:  Do not eat food or drink liquids :After Midnight.     Take these medicines the morning of surgery with A SIP OF WATER: Clonazepam,                                You may not have any metal on your body including hair pins and              piercings  Do not wear jewelry, make-up, lotions, powders or perfumes.             Do not wear nail polish.  Do not shave  48 hours prior to surgery.              Men may shave face and neck.   Do not bring valuables to the hospital. [place].  Contacts, dentures or bridgework may not be worn into surgery.  Leave suitcase in the car. After surgery it may be brought to your room.     Patients discharged the day of surgery will not be allowed to drive home.  Name and phone number of your driver:  Special Instructions: N/A              Please read over the following fact sheets you were given: _____________________________________________________________________             [facility] - Preparing for Surgery Before surgery, you can play an important role.  Because skin is not sterile, your skin needs to be as free of germs as possible.  You can reduce the number of germs on your skin by washing with CHG (chlorahexidine gluconate) soap before surgery.  CHG is an antiseptic cleaner which kills germs and bonds with the skin to continue killing germs even after washing. Please DO NOT use if you have an allergy to CHG or antibacterial soaps.  If your skin becomes reddened/irritated stop using the CHG and inform your nurse when you arrive at Short Stay. Do not shave (including legs and underarms) for at least 48 hours  prior to the first CHG shower.  You may shave your face/neck. Please follow these instructions carefully:  1.  Shower with CHG Soap the night before surgery and the  morning of Surgery.  2.  If you choose to wash your hair, wash your hair first as usual with your  normal  shampoo.  3.  After you shampoo, rinse your hair and body thoroughly to remove the  shampoo.                           4.  Use CHG as you would any other liquid soap.  You can apply chg directly  to the skin and wash                       Gently  with a scrungie or clean washcloth.  5.  Apply the CHG Soap to your body ONLY FROM THE NECK DOWN.   Do not use on face/ open                           Wound or open sores. Avoid contact with eyes, ears mouth and genitals (private parts).                       Wash face,  Genitals (private parts) with your normal soap.             6.  Wash thoroughly, paying special attention to the area where your surgery  will be performed.  7.  Thoroughly rinse your body with warm water from the neck down.  8.  DO NOT shower/wash with your normal soap after using and rinsing off  the CHG Soap.                9.  Pat yourself dry with a clean towel.            10.  Wear clean pajamas.            11.  Place clean sheets on your bed the night of your first shower and do not  sleep with pets. Day of Surgery : Do not apply any lotions/deodorants the morning of surgery.  Please wear clean clothes to the hospital/surgery center.  FAILURE TO FOLLOW THESE INSTRUCTIONS MAY RESULT IN THE CANCELLATION OF YOUR SURGERY PATIENT SIGNATURE_________________________________  NURSE SIGNATURE__________________________________  ________________________________________________________________________   [name]  An incentive spirometer is a tool that can help keep your lungs clear and active. This tool measures how well you are filling your lungs with each breath. Taking long deep breaths may help reverse  or decrease the chance of developing breathing (pulmonary) problems (especially infection) following:  A long period of time when you are unable to move or be active. BEFORE THE PROCEDURE   If the spirometer includes an indicator to show your best effort, your nurse or respiratory therapist will set it to a desired goal.  If possible, sit up straight or lean slightly forward. Try not to slouch.  Hold the incentive spirometer in an upright position. INSTRUCTIONS FOR USE  1. Sit on the edge of your bed if possible, or sit up as far as you can in bed or on a chair. 2. Hold the incentive spirometer in an upright position. 3. Breathe out normally. 4. Place the mouthpiece in your mouth and seal your lips tightly around it. 5. Breathe in slowly and as deeply as possible, raising the piston or the ball toward the top of the column. 6. Hold your breath for 3-5 seconds or for as long as possible. Allow the piston or ball to fall to the bottom of the column. 7. Remove the mouthpiece from your mouth and breathe out normally. 8. Rest for a few seconds and repeat Steps 1 through 7 at least 10 times every 1-2 hours when you are awake. Take your time and take a few normal breaths between deep breaths. 9. The spirometer may include an indicator to show your best effort. Use the indicator as a goal to work toward during each repetition. 10. After each set of 10 deep breaths, practice coughing to be sure your lungs are clear. If you have an incision (the cut made at the time of surgery), support  your incision when coughing by placing a pillow or rolled up towels firmly against it. Once you are able to get out of bed, walk around indoors and cough well. You may stop using the incentive spirometer when instructed by your caregiver.  RISKS AND COMPLICATIONS  Take your time so you do not get dizzy or light-headed.  If you are in pain, you may need to take or ask for pain medication before doing incentive  spirometry. It is harder to take a deep breath if you are having pain. AFTER USE  Rest and breathe slowly and easily.  It can be helpful to keep track of a log of your progress. Your caregiver can provide you with a simple table to help with this. If you are using the spirometer at home, follow these instructions: [place] IF:   You are having difficultly using the spirometer.  You have trouble using the spirometer as often as instructed.  Your pain medication is not giving enough relief while using the spirometer.  You develop fever of 100.5 F (38.1 C) or higher. SEEK IMMEDIATE MEDICAL CARE IF:   You cough up bloody sputum that had not been present before.  You develop fever of 102 F (38.9 C) or greater.  You develop worsening pain at or near the incision site. MAKE SURE YOU:   Understand these instructions.  Will watch your condition.  Will get help right away if you are not doing well or get worse. Document Released: 06/22/2006 Document Revised: 05/04/2011 Document Reviewed: 08/23/2006 [facility] Patient Information 2014 [place], [place].   ________________________________________________________________________

## 2014-02-08 NOTE — Progress Notes (Signed)
Requested [place] bed -talked to [place] in [employer].

## 2014-02-13 ENCOUNTER — Encounter (HOSPITAL_COMMUNITY): Payer: Self-pay | Admitting: *Deleted

## 2014-02-13 ENCOUNTER — Inpatient Hospital Stay (HOSPITAL_COMMUNITY): Payer: Medicare Other | Admitting: Anesthesiology

## 2014-02-13 ENCOUNTER — Encounter (HOSPITAL_COMMUNITY)
Admission: RE | Disposition: A | Discharge status: Home / Self Care | Payer: Self-pay | Source: Ambulatory Visit | Attending: General Surgery

## 2014-02-13 ENCOUNTER — Inpatient Hospital Stay (HOSPITAL_COMMUNITY)
Admission: RE | Admit: 2014-02-13 | Discharge: 2014-02-16 | DRG: 620 | Disposition: A | Discharge status: Home / Self Care | Payer: Medicare Other | Source: Ambulatory Visit | Attending: General Surgery | Admitting: General Surgery

## 2014-02-13 DIAGNOSIS — Z79899 Other long term (current) drug therapy: Secondary | ICD-10-CM | POA: Diagnosis not present

## 2014-02-13 DIAGNOSIS — K2950 Unspecified chronic gastritis without bleeding: Secondary | ICD-10-CM | POA: Diagnosis not present

## 2014-02-13 DIAGNOSIS — Z6841 Body Mass Index (BMI) 40.0 and over, adult: Secondary | ICD-10-CM

## 2014-02-13 DIAGNOSIS — G4733 Obstructive sleep apnea (adult) (pediatric): Secondary | ICD-10-CM | POA: Diagnosis present

## 2014-02-13 DIAGNOSIS — K219 Gastro-esophageal reflux disease without esophagitis: Secondary | ICD-10-CM | POA: Diagnosis present

## 2014-02-13 DIAGNOSIS — K660 Peritoneal adhesions (postprocedural) (postinfection): Secondary | ICD-10-CM | POA: Diagnosis present

## 2014-02-13 DIAGNOSIS — E781 Pure hyperglyceridemia: Secondary | ICD-10-CM | POA: Diagnosis present

## 2014-02-13 DIAGNOSIS — Z01812 Encounter for preprocedural laboratory examination: Secondary | ICD-10-CM | POA: Diagnosis not present

## 2014-02-13 DIAGNOSIS — F3130 Bipolar disorder, current episode depressed, mild or moderate severity, unspecified: Secondary | ICD-10-CM | POA: Diagnosis present

## 2014-02-13 DIAGNOSIS — F419 Anxiety disorder, unspecified: Secondary | ICD-10-CM | POA: Diagnosis present

## 2014-02-13 DIAGNOSIS — I10 Essential (primary) hypertension: Secondary | ICD-10-CM | POA: Diagnosis present

## 2014-02-13 DIAGNOSIS — M1990 Unspecified osteoarthritis, unspecified site: Secondary | ICD-10-CM | POA: Diagnosis not present

## 2014-02-13 DIAGNOSIS — Z9884 Bariatric surgery status: Secondary | ICD-10-CM | POA: Diagnosis not present

## 2014-02-13 DIAGNOSIS — Z87891 Personal history of nicotine dependence: Secondary | ICD-10-CM

## 2014-02-13 DIAGNOSIS — R110 Nausea: Secondary | ICD-10-CM

## 2014-02-13 HISTORY — PX: LAPAROSCOPIC GASTRIC SLEEVE RESECTION: SHX5895

## 2014-02-13 LAB — HEMOGLOBIN AND HEMATOCRIT, BLOOD
HCT: 37.7 % (ref 36.0–46.0)
Hemoglobin: 11.8 g/dL — ABNORMAL LOW (ref 12.0–15.0)

## 2014-02-13 LAB — PREGNANCY, URINE: Preg Test, Ur: NEGATIVE

## 2014-02-13 SURGERY — GASTRECTOMY, SLEEVE, LAPAROSCOPIC
Anesthesia: General

## 2014-02-13 MED ORDER — PROPOFOL 10 MG/ML IV BOLUS
INTRAVENOUS | Status: AC
  Filled 2014-02-13: qty 20

## 2014-02-13 MED ORDER — TISSEEL VH 10 ML EX KIT
PACK | CUTANEOUS | Status: AC
  Filled 2014-02-13: qty 1

## 2014-02-13 MED ORDER — LIDOCAINE HCL (CARDIAC) 20 MG/ML IV SOLN
INTRAVENOUS | Status: DC | PRN
  Administered 2014-02-13: 100 mg via INTRAVENOUS

## 2014-02-13 MED ORDER — CHLORHEXIDINE GLUCONATE 4 % EX LIQD: 60.0000 mL | Freq: Once | CUTANEOUS | Status: DC

## 2014-02-13 MED ORDER — PANTOPRAZOLE SODIUM 40 MG IV SOLR
40.0000 mg | Freq: Every day | INTRAVENOUS | Status: DC
  Administered 2014-02-13 – 2014-02-14 (×2): 40 mg via INTRAVENOUS
  Filled 2014-02-13 (×3): qty 40

## 2014-02-13 MED ORDER — DEXTROSE 5 % IV SOLN
2.0000 g | INTRAVENOUS | Status: AC
  Administered 2014-02-13 (×2): 2 g via INTRAVENOUS

## 2014-02-13 MED ORDER — CISATRACURIUM BESYLATE (PF) 10 MG/5ML IV SOLN
INTRAVENOUS | Status: DC | PRN
  Administered 2014-02-13 (×3): 4 mg via INTRAVENOUS
  Administered 2014-02-13 (×2): 2 mg via INTRAVENOUS
  Administered 2014-02-13: 4 mg via INTRAVENOUS
  Administered 2014-02-13: 10 mg via INTRAVENOUS

## 2014-02-13 MED ORDER — HEPARIN SODIUM (PORCINE) 5000 UNIT/ML IJ SOLN
5000.0000 [IU] | INTRAMUSCULAR | Status: AC
  Administered 2014-02-13: 5000 [IU] via SUBCUTANEOUS
  Filled 2014-02-13: qty 1

## 2014-02-13 MED ORDER — BUPIVACAINE LIPOSOME 1.3 % IJ SUSP
20.0000 mL | Freq: Once | INTRAMUSCULAR | Status: AC
  Administered 2014-02-13: 20 mL
  Filled 2014-02-13: qty 20

## 2014-02-13 MED ORDER — SUFENTANIL CITRATE 50 MCG/ML IV SOLN
INTRAVENOUS | Status: AC
  Filled 2014-02-13: qty 1

## 2014-02-13 MED ORDER — SUCCINYLCHOLINE CHLORIDE 20 MG/ML IJ SOLN
INTRAMUSCULAR | Status: DC | PRN
  Administered 2014-02-13: 100 mg via INTRAVENOUS

## 2014-02-13 MED ORDER — LACTATED RINGERS IV SOLN: INTRAVENOUS | Status: DC

## 2014-02-13 MED ORDER — ONDANSETRON HCL 4 MG/2ML IJ SOLN
4.0000 mg | INTRAMUSCULAR | Status: DC | PRN
  Administered 2014-02-13 – 2014-02-15 (×8): 4 mg via INTRAVENOUS
  Filled 2014-02-13 (×8): qty 2

## 2014-02-13 MED ORDER — ACETAMINOPHEN 10 MG/ML IV SOLN
1000.0000 mg | Freq: Four times a day (QID) | INTRAVENOUS | Status: AC
  Administered 2014-02-14 (×3): 1000 mg via INTRAVENOUS
  Filled 2014-02-13 (×6): qty 100

## 2014-02-13 MED ORDER — ALUM & MAG HYDROXIDE-SIMETH 200-200-20 MG/5ML PO SUSP: 15.0000 mL | Freq: Four times a day (QID) | ORAL | Status: DC | PRN

## 2014-02-13 MED ORDER — LACTATED RINGERS IV SOLN
INTRAVENOUS | Status: DC
  Administered 2014-02-13: 1000 mL via INTRAVENOUS
  Administered 2014-02-13 (×2): via INTRAVENOUS

## 2014-02-13 MED ORDER — LABETALOL HCL 5 MG/ML IV SOLN
INTRAVENOUS | Status: AC
  Filled 2014-02-13: qty 4

## 2014-02-13 MED ORDER — ONDANSETRON HCL 4 MG/2ML IJ SOLN
INTRAMUSCULAR | Status: DC | PRN
  Administered 2014-02-13: 4 mg via INTRAVENOUS

## 2014-02-13 MED ORDER — MIDAZOLAM HCL 10 MG/2ML IJ SOLN
2.0000 mg | Freq: Once | INTRAMUSCULAR | Status: DC
  Administered 2014-02-13: 2 mg via INTRAVENOUS

## 2014-02-13 MED ORDER — HYDROMORPHONE HCL 1 MG/ML IJ SOLN
INTRAMUSCULAR | Status: AC
  Filled 2014-02-13: qty 1

## 2014-02-13 MED ORDER — DEXAMETHASONE SODIUM PHOSPHATE 10 MG/ML IJ SOLN
INTRAMUSCULAR | Status: DC | PRN
  Administered 2014-02-13: 10 mg via INTRAVENOUS

## 2014-02-13 MED ORDER — LIDOCAINE HCL (CARDIAC) 20 MG/ML IV SOLN
INTRAVENOUS | Status: AC
  Filled 2014-02-13: qty 5

## 2014-02-13 MED ORDER — CEFOXITIN SODIUM 2 G IV SOLR
INTRAVENOUS | Status: AC
  Filled 2014-02-13: qty 2

## 2014-02-13 MED ORDER — MIDAZOLAM HCL 5 MG/ML IJ SOLN: 2.0000 mg | Freq: Once | INTRAMUSCULAR | Status: DC

## 2014-02-13 MED ORDER — OXYCODONE HCL 5 MG/5ML PO SOLN
5.0000 mg | ORAL | Status: DC | PRN
  Administered 2014-02-14 – 2014-02-16 (×6): 10 mg via ORAL
  Administered 2014-02-16: 5 mg via ORAL
  Filled 2014-02-13 (×3): qty 10
  Filled 2014-02-13: qty 50
  Filled 2014-02-13: qty 10
  Filled 2014-02-13: qty 5
  Filled 2014-02-13 (×2): qty 10
  Filled 2014-02-13: qty 5

## 2014-02-13 MED ORDER — NEOSTIGMINE METHYLSULFATE 10 MG/10ML IV SOLN
INTRAVENOUS | Status: DC | PRN
  Administered 2014-02-13: 4 mg via INTRAVENOUS

## 2014-02-13 MED ORDER — UNJURY CHICKEN SOUP POWDER
2.0000 [oz_av] | Freq: Four times a day (QID) | ORAL | Status: DC
  Filled 2014-02-13 (×5): qty 27

## 2014-02-13 MED ORDER — PROPOFOL 10 MG/ML IV BOLUS
INTRAVENOUS | Status: DC | PRN
  Administered 2014-02-13: 200 mg via INTRAVENOUS

## 2014-02-13 MED ORDER — FENTANYL CITRATE 0.05 MG/ML IJ SOLN: 25.0000 ug | INTRAMUSCULAR | Status: DC | PRN

## 2014-02-13 MED ORDER — HYDROMORPHONE HCL 1 MG/ML IJ SOLN
0.2500 mg | INTRAMUSCULAR | Status: DC | PRN
  Administered 2014-02-13 (×3): 0.5 mg via INTRAVENOUS

## 2014-02-13 MED ORDER — SODIUM CHLORIDE 0.9 % IJ SOLN
INTRAMUSCULAR | Status: AC
  Filled 2014-02-13: qty 20

## 2014-02-13 MED ORDER — LACTATED RINGERS IR SOLN
Status: DC | PRN
  Administered 2014-02-13: 1000 mL

## 2014-02-13 MED ORDER — KCL IN DEXTROSE-NACL 20-5-0.45 MEQ/L-%-% IV SOLN
INTRAVENOUS | Status: DC
  Administered 2014-02-13: 1000 mL via INTRAVENOUS
  Administered 2014-02-14: 125 mL via INTRAVENOUS
  Administered 2014-02-14: 1000 mL via INTRAVENOUS
  Administered 2014-02-15: 125 mL via INTRAVENOUS
  Filled 2014-02-13 (×7): qty 1000

## 2014-02-13 MED ORDER — LABETALOL HCL 5 MG/ML IV SOLN
INTRAVENOUS | Status: DC | PRN
Start: 2014-02-13 — End: 2014-02-13
  Administered 2014-02-13 (×2): 2.5 mg via INTRAVENOUS

## 2014-02-13 MED ORDER — CISATRACURIUM BESYLATE 20 MG/10ML IV SOLN
INTRAVENOUS | Status: AC
  Filled 2014-02-13: qty 10

## 2014-02-13 MED ORDER — SODIUM CHLORIDE 0.9 % IJ SOLN
INTRAMUSCULAR | Status: AC
  Filled 2014-02-13: qty 50

## 2014-02-13 MED ORDER — ACETAMINOPHEN 160 MG/5ML PO SOLN: 325.0000 mg | ORAL | Status: DC | PRN

## 2014-02-13 MED ORDER — ACETAMINOPHEN 160 MG/5ML PO SOLN: 650.0000 mg | ORAL | Status: DC | PRN

## 2014-02-13 MED ORDER — MIDAZOLAM HCL 2 MG/2ML IJ SOLN
INTRAMUSCULAR | Status: AC
  Filled 2014-02-13: qty 2

## 2014-02-13 MED ORDER — METOCLOPRAMIDE HCL 5 MG/ML IJ SOLN
10.0000 mg | Freq: Once | INTRAMUSCULAR | Status: AC
  Administered 2014-02-13: 10 mg via INTRAVENOUS
  Filled 2014-02-13: qty 2

## 2014-02-13 MED ORDER — GLYCOPYRROLATE 0.2 MG/ML IJ SOLN
INTRAMUSCULAR | Status: DC | PRN
  Administered 2014-02-13: 0.6 mg via INTRAVENOUS

## 2014-02-13 MED ORDER — PROMETHAZINE HCL 25 MG/ML IJ SOLN: 6.2500 mg | INTRAMUSCULAR | Status: DC | PRN

## 2014-02-13 MED ORDER — CISATRACURIUM BESYLATE 20 MG/10ML IV SOLN
INTRAVENOUS | Status: AC
Start: 2014-02-13 — End: 2014-02-13
  Filled 2014-02-13: qty 10

## 2014-02-13 MED ORDER — UNJURY VANILLA POWDER
2.0000 [oz_av] | Freq: Four times a day (QID) | ORAL | Status: DC
  Administered 2014-02-15 (×3): 2 [oz_av] via ORAL
  Filled 2014-02-13 (×5): qty 27

## 2014-02-13 MED ORDER — MEPERIDINE HCL 50 MG/ML IJ SOLN: 6.2500 mg | INTRAMUSCULAR | Status: DC | PRN

## 2014-02-13 MED ORDER — 0.9 % SODIUM CHLORIDE (POUR BTL) OPTIME
TOPICAL | Status: DC | PRN
  Administered 2014-02-13: 1000 mL

## 2014-02-13 MED ORDER — MORPHINE SULFATE 2 MG/ML IJ SOLN
2.0000 mg | INTRAMUSCULAR | Status: DC | PRN
  Administered 2014-02-13: 2 mg via INTRAVENOUS
  Administered 2014-02-13: 4 mg via INTRAVENOUS
  Administered 2014-02-13 (×2): 2 mg via INTRAVENOUS
  Administered 2014-02-14: 4 mg via INTRAVENOUS
  Administered 2014-02-14: 2 mg via INTRAVENOUS
  Administered 2014-02-14: 4 mg via INTRAVENOUS
  Administered 2014-02-14 (×2): 2 mg via INTRAVENOUS
  Administered 2014-02-14: 4 mg via INTRAVENOUS
  Administered 2014-02-14 – 2014-02-15 (×3): 2 mg via INTRAVENOUS
  Filled 2014-02-13 (×2): qty 2
  Filled 2014-02-13: qty 1
  Filled 2014-02-13: qty 2
  Filled 2014-02-13 (×5): qty 1
  Filled 2014-02-13: qty 2
  Filled 2014-02-13: qty 1
  Filled 2014-02-13: qty 2
  Filled 2014-02-13: qty 1

## 2014-02-13 MED ORDER — ONDANSETRON HCL 4 MG/2ML IJ SOLN
INTRAMUSCULAR | Status: AC
  Filled 2014-02-13: qty 2

## 2014-02-13 MED ORDER — ACETAMINOPHEN 10 MG/ML IV SOLN
1000.0000 mg | Freq: Once | INTRAVENOUS | Status: AC
  Administered 2014-02-13: 1000 mg via INTRAVENOUS
  Filled 2014-02-13 (×2): qty 100

## 2014-02-13 MED ORDER — FAMOTIDINE IN NACL 20-0.9 MG/50ML-% IV SOLN
20.0000 mg | Freq: Once | INTRAVENOUS | Status: AC
  Administered 2014-02-13: 20 mg via INTRAVENOUS
  Filled 2014-02-13: qty 50

## 2014-02-13 MED ORDER — UNJURY CHOCOLATE CLASSIC POWDER
2.0000 [oz_av] | Freq: Four times a day (QID) | ORAL | Status: DC
  Administered 2014-02-15 – 2014-02-16 (×2): 2 [oz_av] via ORAL
  Filled 2014-02-13 (×5): qty 27

## 2014-02-13 MED ORDER — ENOXAPARIN SODIUM 30 MG/0.3ML ~~LOC~~ SOLN
30.0000 mg | Freq: Two times a day (BID) | SUBCUTANEOUS | Status: DC
  Administered 2014-02-14 – 2014-02-16 (×5): 30 mg via SUBCUTANEOUS
  Filled 2014-02-13 (×7): qty 0.3

## 2014-02-13 MED ORDER — SUFENTANIL CITRATE 50 MCG/ML IV SOLN
INTRAVENOUS | Status: DC | PRN
Start: 2014-02-13 — End: 2014-02-13
  Administered 2014-02-13 (×3): 10 ug via INTRAVENOUS
  Administered 2014-02-13: 5 ug via INTRAVENOUS
  Administered 2014-02-13: 10 ug via INTRAVENOUS
  Administered 2014-02-13: 20 ug via INTRAVENOUS
  Administered 2014-02-13 (×2): 10 ug via INTRAVENOUS

## 2014-02-13 SURGICAL SUPPLY — 65 items
ADH SKN CLS APL DERMABOND .7 (GAUZE/BANDAGES/DRESSINGS) ×1
APL SRG 32X5 SNPLK LF DISP (MISCELLANEOUS)
APPLICATOR COTTON TIP 6IN STRL (MISCELLANEOUS) IMPLANT
APPLIER CLIP ROT 10 11.4 M/L (STAPLE)
APR CLP MED LRG 11.4X10 (STAPLE)
BLADE SURG SZ11 CARB STEEL (BLADE) ×2 IMPLANT
CABLE HIGH FREQUENCY MONO STRZ (ELECTRODE) ×1 IMPLANT
CHLORAPREP W/TINT 26ML (MISCELLANEOUS) ×4 IMPLANT
CLIP APPLIE ROT 10 11.4 M/L (STAPLE) IMPLANT
DERMABOND ADVANCED (GAUZE/BANDAGES/DRESSINGS) ×1
DERMABOND ADVANCED .7 DNX12 (GAUZE/BANDAGES/DRESSINGS) IMPLANT
DEVICE SUT QUICK LOAD TK 5 (STAPLE) IMPLANT
DEVICE SUT TI-KNOT TK 5X26 (MISCELLANEOUS) IMPLANT
DEVICE SUTURE ENDOST 10MM (ENDOMECHANICALS) IMPLANT
DEVICE TROCAR PUNCTURE CLOSURE (ENDOMECHANICALS) ×2 IMPLANT
DRAPE CAMERA CLOSED 9X96 (DRAPES) ×2 IMPLANT
DRAPE UTILITY XL STRL (DRAPES) ×4 IMPLANT
ELECT REM PT RETURN 9FT ADLT (ELECTROSURGICAL) ×2
ELECTRODE REM PT RTRN 9FT ADLT (ELECTROSURGICAL) ×1 IMPLANT
GAUZE SPONGE 4X4 12PLY STRL (GAUZE/BANDAGES/DRESSINGS) IMPLANT
GLOVE BIOGEL M STRL SZ7.5 (GLOVE) ×2 IMPLANT
GOWN STRL REUS W/TWL XL LVL3 (GOWN DISPOSABLE) ×7 IMPLANT
HOVERMATT SINGLE USE (MISCELLANEOUS) ×2 IMPLANT
KIT BASIN OR (CUSTOM PROCEDURE TRAY) ×2 IMPLANT
LIQUID BAND (GAUZE/BANDAGES/DRESSINGS) ×3 IMPLANT
NDL SPNL 22GX3.5 QUINCKE BK (NEEDLE) ×1 IMPLANT
NEEDLE SPNL 22GX3.5 QUINCKE BK (NEEDLE) ×2 IMPLANT
PACK UNIVERSAL I (CUSTOM PROCEDURE TRAY) ×2 IMPLANT
PEN SKIN MARKING BROAD (MISCELLANEOUS) ×2 IMPLANT
RELOAD STAPLE 60 3.6 BLU REG (STAPLE) IMPLANT
RELOAD STAPLE 60 3.8 GOLD REG (STAPLE) IMPLANT
RELOAD STAPLE 60 4.1 GRN THCK (STAPLE) IMPLANT
RELOAD STAPLER BLUE 60MM (STAPLE) ×2 IMPLANT
RELOAD STAPLER GOLD 60MM (STAPLE) ×2 IMPLANT
RELOAD STAPLER GREEN 60MM (STAPLE) ×2 IMPLANT
SCISSORS LAP 5X35 DISP (ENDOMECHANICALS) ×1 IMPLANT
SCISSORS LAP 5X45 EPIX DISP (ENDOMECHANICALS) ×2 IMPLANT
SEALANT SURGICAL APPL DUAL CAN (MISCELLANEOUS) ×1 IMPLANT
SET IRRIG TUBING LAPAROSCOPIC (IRRIGATION / IRRIGATOR) ×2 IMPLANT
SHEARS CURVED HARMONIC AC 45CM (MISCELLANEOUS) ×2 IMPLANT
SLEEVE ADV FIXATION 5X100MM (TROCAR) ×4 IMPLANT
SLEEVE GASTRECTOMY 36FR VISIGI (MISCELLANEOUS) ×2 IMPLANT
SLEEVE XCEL OPT CAN 5 100 (ENDOMECHANICALS) ×2 IMPLANT
SOLUTION ANTI FOG 6CC (MISCELLANEOUS) ×2 IMPLANT
SPONGE LAP 18X18 X RAY DECT (DISPOSABLE) ×2 IMPLANT
STAPLER ECHELON BIOABSB 60 FLE (MISCELLANEOUS) ×6 IMPLANT
STAPLER ECHELON LONG 60 440 (INSTRUMENTS) ×1 IMPLANT
STAPLER RELOAD BLUE 60MM (STAPLE) ×4
STAPLER RELOAD GOLD 60MM (STAPLE) ×4
STAPLER RELOAD GREEN 60MM (STAPLE) ×4
SUT MNCRL AB 4-0 PS2 18 (SUTURE) ×3 IMPLANT
SUT SURGIDAC NAB ES-9 0 48 120 (SUTURE) IMPLANT
SUT VICRYL 0 TIES 12 18 (SUTURE) ×2 IMPLANT
SYR 20CC LL (SYRINGE) ×2 IMPLANT
SYR 50ML LL SCALE MARK (SYRINGE) ×2 IMPLANT
TOWEL OR 17X26 10 PK STRL BLUE (TOWEL DISPOSABLE) ×2 IMPLANT
TOWEL OR NON WOVEN STRL DISP B (DISPOSABLE) ×2 IMPLANT
TRAY FOLEY CATH 14FRSI W/METER (CATHETERS) ×1 IMPLANT
TROCAR ADV FIXATION 5X100MM (TROCAR) ×2 IMPLANT
TROCAR BLADELESS 15MM (ENDOMECHANICALS) IMPLANT
TROCAR BLADELESS OPT 5 100 (ENDOMECHANICALS) ×2 IMPLANT
TROCAR XCEL NON-BLD 11X100MML (ENDOMECHANICALS) ×1 IMPLANT
TUBING CONNECTING 10 (TUBING) ×2 IMPLANT
TUBING ENDO SMARTCAP (MISCELLANEOUS) ×2 IMPLANT
TUBING FILTER THERMOFLATOR (ELECTROSURGICAL) ×2 IMPLANT

## 2014-02-13 NOTE — Anesthesia Procedure Notes (Signed)
Procedure Name: Intubation Date/Time: 02/13/2014 1:02 PM Performed by: Danley Danker L Patient Re-evaluated:Patient Re-evaluated prior to inductionOxygen Delivery Method: Circle system utilized Preoxygenation: Pre-oxygenation with 100% oxygen Intubation Type: IV induction, Cricoid Pressure applied and Rapid sequence Laryngoscope Size: Miller and 3 Grade View: Grade I Tube type: Oral Tube size: 7.5 mm Number of attempts: 1 Airway Equipment and Method: Stylet Placement Confirmation: ETT inserted through vocal cords under direct vision,  breath sounds checked- equal and bilateral and positive ETCO2 Secured at: 20 cm Tube secured with: Tape Dental Injury: Teeth and Oropharynx as per pre-operative assessment

## 2014-02-13 NOTE — Interval H&P Note (Signed)
History and Physical Interval Note:  02/13/2014 12:01 PM  [name]  has presented today for surgery, with the diagnosis of Morbid Obesity  The various methods of treatment have been discussed with the patient and family. After consideration of risks, benefits and other options for treatment, the patient has consented to  Procedure(s): LAPAROSCOPIC GASTRIC SLEEVE RESECTION (N/A) with upper endoscopy as a surgical intervention .  The patient's history has been reviewed, patient examined, no change in status, stable for surgery.  I have reviewed the patient's chart and labs.  Questions were answered to the patient's satisfaction.    Leighton Ruff. Redmond Pulling, MD, [place], Bariatric, & Minimally Invasive Surgery [facility], [place]  [facility] M

## 2014-02-13 NOTE — Anesthesia Preprocedure Evaluation (Addendum)
Anesthesia Evaluation  Patient identified by MRN, date of birth, ID band Patient awake    Reviewed: Allergy & Precautions, H&P , NPO status , Patient's Chart, lab work & pertinent test results  Airway Mallampati: II  TM Distance: >3 FB Neck ROM: Full    Dental no notable dental hx.    Pulmonary asthma , former smoker,  breath sounds clear to auscultation  Pulmonary exam normal       Cardiovascular negative cardio ROS  Rhythm:Regular Rate:Normal     Neuro/Psych Anxiety Depression Bipolar Disorder negative neurological ROS  negative psych ROS   GI/Hepatic negative GI ROS, Neg liver ROS,   Endo/Other  Morbid obesity  Renal/GU negative Renal ROS  negative genitourinary   Musculoskeletal negative musculoskeletal ROS (+)   Abdominal   Peds negative pediatric ROS (+)  Hematology negative hematology ROS (+)   Anesthesia Other Findings   Reproductive/Obstetrics negative OB ROS                            Anesthesia Physical Anesthesia Plan  ASA: III  Anesthesia Plan: General   Post-op Pain Management:    Induction: Intravenous  Airway Management Planned: Oral ETT  Additional Equipment:   Intra-op Plan:   Post-operative Plan: Extubation in OR  Informed Consent: I have reviewed the patients History and Physical, chart, labs and discussed the procedure including the risks, benefits and alternatives for the proposed anesthesia with the patient or authorized representative who has indicated his/her understanding and acceptance.   Dental advisory given  Plan Discussed with: CRNA  Anesthesia Plan Comments:         Anesthesia Quick Evaluation

## 2014-02-13 NOTE — H&P (Signed)
[name] 01/25/2014 9:36 AM Location: [facility] Patient #: [id number] DOB: [birth year] Divorced / Language: [name] / Race: White Female  History of Present Illness [name] Marenda Accardi MD; 01/26/2014 11:59 AM) Patient words: pre Op sleeve.  The patient is a 38 year old female who presents for a pre-op visit. 38 year old morbidly obese Caucasian female comes in today for preoperative appointment. She is currently scheduled to undergo laparoscopic sleeve gastrectomy on December 22. I initially met her in March 2015. Her weight in time was 316 pounds. Her BMI was 47.8. She denies any changes since I last saw her. She denies any trips to the emergency room at [facility]. She has completed her preoperative workup and has been cleared for surgery. She underwent a sleep study in May which demonstrated minimal obstructive sleep apnea. She reports that she is no longer smoking. She stopped using cigarettes a few months ago. She transitioned to Chantix and nicotine patches. She has not worn a patch and 5 weeks. She is no longer using E cigarettes either. Her upper GI was unremarkable. Her chest x-ray was unremarkable. Her abdominal ultrasound showed gallstones and a fatty liver. Her triglyceride level was 166, HDL level 137, glucose was 129.   Other Problems [name], MD; 01/26/2014 12:01 PM) Anxiety Disorder Arthritis Back Pain Depression Diverticulosis Kidney Stone Umbilical Hernia Repair HIGH TRIGLYCERIDES (272.1  E78.1) ELEVATED BLOOD PRESSURE (401.9  I10) BIPOLAR AFFECTIVE DISORDER, REMISSION STATUS UNSPECIFIED (296.80  F31.9) GASTROESOPHAGEAL REFLUX DISEASE WITHOUT ESOPHAGITIS (530.81  K21.9) OBESITY, MORBID, BMI 40.0-49.9 (278.01  E66.01)  Past Surgical History [name], MD; 01/26/2014 11:55 AM) Orpah Cobb Fissure Repair Cesarean Section - Multiple Colon Removal - Partial2008 perf diverticulitis, Hartman's procedure; then colostomy  reversal in 2008 Oral Surgery Tonsillectomy Ventral / Umbilical Hernia [id number] Left. open incisional hernia repair (no mesh, complicated by postop seroma)  Diagnostic Studies History Briant Cedar, [place]; 01/25/2014 9:36 AM) Colonoscopy 1-5 years ago Mammogram never Pap Smear 1-5 years ago  Allergies Briant Cedar, [place]; 01/25/2014 9:37 AM) No Known Drug Allergies12/04/2013  Medication History [name], MD; 01/26/2014 12:01 PM) Rockie Neighbours (250MG  Tablet, Oral) Active. KlonoPIN (1MG  Tablet, Oral) Active. Lisinopril (20MG  Tablet, Oral) Active. Lithium Carbonate (300MG  Tablet, Oral) Active. ZyPREXA (10MG  Tablet, Oral) Active. SEROquel (400MG  Tablet, Oral) Active. Valtrex (500MG  Tablet, Oral) Active. Ambien (10MG  Tablet, Oral) Active. Vyvanse (40MG  Capsule, Oral) Active. OxyCODONE HCl (5MG /5ML Solution, 5-10 Milliliter Oral every four hours, as needed, Taken starting 01/25/2014) Active.  Social History Briant Cedar, [place]; 01/25/2014 9:36 AM) Alcohol use Remotely quit alcohol use. Caffeine use Tea. No drug use Tobacco use Former smoker.  Family History Briant Cedar, [place]; 01/25/2014 9:36 AM) Arthritis Mother. Hypertension Mother. Melanoma Daughter. Thyroid problems Mother.  Pregnancy / Birth History Briant Cedar, CMA; 01/25/2014 9:36 AM) Age at menarche 48 years. Contraceptive History Intrauterine device. Gravida 2 Irregular periods Maternal age 67-20 Para 2  Review of Systems Briant Cedar CMA; 01/25/2014 9:36 AM) General Present- Weight Gain. Not Present- Appetite Loss, Chills, Fatigue, Fever, Night Sweats and Weight Loss. Skin Not Present- Change in Wart/Mole, Dryness, Hives, Jaundice, New Lesions, Non-Healing Wounds, Rash and Ulcer. HEENT Present- Seasonal Allergies. Not Present- Earache, Hearing Loss, Hoarseness, Nose Bleed, Oral Ulcers, Ringing in the Ears, Sinus Pain, Sore Throat, Visual Disturbances, Wears  glasses/contact lenses and Yellow Eyes. Respiratory Not Present- Bloody sputum, Chronic Cough, Difficulty Breathing, Snoring and Wheezing. Breast Not Present- Breast Mass, Breast Pain, Nipple Discharge and Skin Changes. Cardiovascular Not Present- Chest Pain, Difficulty Breathing  Lying Down, Leg Cramps, Palpitations, Rapid Heart Rate, Shortness of Breath and Swelling of Extremities. Gastrointestinal Not Present- Abdominal Pain, Bloating, Bloody Stool, Change in Bowel Habits, Chronic diarrhea, Constipation, Difficulty Swallowing, Excessive gas, Gets full quickly at meals, Hemorrhoids, Indigestion, Nausea, Rectal Pain and Vomiting. Female Genitourinary Not Present- Frequency, Nocturia, Painful Urination, Pelvic Pain and Urgency. Musculoskeletal Present- Back Pain, Joint Pain and Joint Stiffness. Not Present- Muscle Pain, Muscle Weakness and Swelling of Extremities. Neurological Present- Headaches. Not Present- Decreased Memory, Fainting, Numbness, Seizures, Tingling, Tremor, Trouble walking and Weakness. Psychiatric Present- Anxiety and Bipolar. Not Present- Change in Sleep Pattern, Depression, Fearful and Frequent crying.   Vitals Briant Cedar CMA; 01/25/2014 9:40 AM) 01/25/2014 9:40 AM Weight: 320 lb Height: 68in Body Surface Area: 2.64 m Body Mass Index: 48.66 kg/m Temp.: 97.68F  Pulse: 108 (Regular)  BP: 160/100 (Sitting, Left Arm, Standard)    Physical Exam [name] Marquail Bradwell MD; 01/26/2014 11:58 AM) General Mental Status-Alert. General Appearance-Consistent with stated age. Hydration-Well hydrated. Voice-Normal. Note: Morbidly obese, central truncal obesity   Head and Neck Head-normocephalic, atraumatic with no lesions or palpable masses. Trachea-midline. Thyroid Gland Characteristics - normal size and consistency.  Eye Eyeball - Bilateral-Extraocular movements intact. Sclera/Conjunctiva - Bilateral-No scleral icterus.  Chest and Lung  Exam Chest and lung exam reveals -quiet, even and easy respiratory effort with no use of accessory muscles and on auscultation, normal breath sounds, no adventitious sounds and normal vocal resonance. Inspection Chest Wall - Normal. Back - normal.  Breast - Did not examine.  Cardiovascular Cardiovascular examination reveals -normal heart sounds, regular rate and rhythm with no murmurs and normal pedal pulses bilaterally.  Abdomen Inspection Inspection of the abdomen reveals - No Hernias. Skin - Scar - Note: Old Midline incision, old colostomy scar. I cannot appreciate any abdominal wall hernias. Palpation/Percussion Palpation and Percussion of the abdomen reveal - Soft, Non Tender, No Rebound tenderness, No Rigidity (guarding) and No hepatosplenomegaly. Auscultation Auscultation of the abdomen reveals - Bowel sounds normal.  Peripheral Vascular Upper Extremity Palpation - Pulses bilaterally normal.  Neurologic Neurologic evaluation reveals -alert and oriented x 3 with no impairment of recent or remote memory. Mental Status-Normal.  Neuropsychiatric The patient's mood and affect are described as -normal. Judgment and Insight-insight is appropriate concerning matters relevant to self.  Musculoskeletal Normal Exam - Left-Upper Extremity Strength Normal and Lower Extremity Strength Normal. Normal Exam - Right-Upper Extremity Strength Normal and Lower Extremity Strength Normal.  Lymphatic Head & Neck  General Head & Neck Lymphatics: Bilateral - Description - Normal. Axillary - Did not examine. Femoral & Inguinal - Did not examine.    Assessment & Plan [name] Cloey Sferrazza MD; 01/26/2014 12:00 PM) OBESITY, MORBID, BMI 40.0-49.9 (278.01  E66.01) Impression: We reviewed her preoperative workup to date. We discussed her lab findings as well as radiological imaging. We also reviewed some old CT scans that she had as well. There is no evidence of ventral hernia on the CT  scan from 2013. It also appears that her spleen and stomach were in the normal anatomical position. She does complain of some prominence to her left upper quadrant abdominal wall. This could be a little bit of a diastases. We discussed the typical hospitalization. We discussed the surgical steps again. We discussed what to expect after surgery. We discussed the importance of the preoperative diet. She was given her prescription for postoperative pain control today. I advised her to contact the office should she have any additional questions over the next couple  weeks Current Plans  Instructions: Congratulations on starting your journey to a healthier life! Over the next few weeks you will be undergoing tests (x-rays and labs) and seeing specialists to help evaluate you for weight loss surgery. These tests and consultations with a psychologist and nutritionist are needed to prepare you for the lifestyle changes that lie ahead and are often required by insurance companies to approve you for surgery. Please call me if you have any questions during the evaluation.  Pathway to Surgery:  Two weeks prior to surgery Go on the extremely low carb liquid diet - this will decrease the size of your liver which will make surgery safer - the nutritionist will go over this at a later date Attend preoperative appointment with your surgeon Attend preoperative surgery class  One week prior to surgery No aspirin products. Tylenol is acceptable  24 hours prior to surgery No alcoholic beverages Report fever greater than 100.5 or excessive nasal drainage suggesting infection Continue bariatric preop diet Perform bowel prep if ordered Do not eat or drink anything after midnight the night before surgery Do not take any medications except those instructed by the anesthesiologist  Morning of surgery Please arrive at the hospital at least 2 hours before your scheduled surgery time. No makeup, fingernail polish or  jewelry Bring insurance cards with you Bring your CPAP mask if you use this Started OxyCODONE HCl $RemoveBefor'5MG'[id number]$ /5ML, 5-10 Milliliter every four hours, as needed, 200 Milliliter, 01/25/2014, No Refill. Anxiety Disorder Depression HIGH TRIGLYCERIDES (272.1  E78.1) ELEVATED BLOOD PRESSURE (401.9  I10) BIPOLAR AFFECTIVE DISORDER, REMISSION STATUS UNSPECIFIED (296.80  F31.9) GASTROESOPHAGEAL REFLUX DISEASE WITHOUT ESOPHAGITIS (530.81  K21.9)  Leighton Ruff. Redmond Pulling, MD, FACS General, Bariatric, & Minimally Invasive Surgery [facility], [place]

## 2014-02-13 NOTE — Progress Notes (Signed)
Utilization review completed.  

## 2014-02-13 NOTE — Op Note (Signed)
02/13/2014 Lynnell Chad 1975-11-06 [id number]   PRE-OPERATIVE DIAGNOSIS:   Morbid obesity BMI 47 Hypertension Elevated triglycerides Bipolar depression Mild OSA GERD Anxiety   POST-OPERATIVE DIAGNOSIS:  Same + intra-abdominal adhesions  PROCEDURE:  Procedure(s): LAPAROSCOPIC SLEEVE GASTRECTOMY  LAPAROSCOPIC LYSIS OF ADHESIONS x 1 hour UPPER GI ENDOSCOPY  SURGEON:  Surgeon(s): [name], MD FACS FASMBS  ASSISTANTS: Johnathan Hausen, MD FACS  ANESTHESIA:   general  DRAINS: none   BOUGIE: 36 fr ViSiGi  LOCAL MEDICATIONS USED:  MARCAINE + Exparel  SPECIMEN:  Source of Specimen:  Greater curvature of stomach  DISPOSITION OF SPECIMEN:  PATHOLOGY  COUNTS:  YES  INDICATION FOR PROCEDURE: This is a very pleasant 38 year old morbidly obese WF who has had unsuccessful attempts for sustained weight loss. She presents today for a planned laparoscopic sleeve gastrectomy with upper endoscopy. We have discussed the risk and benefits of the procedure extensively preoperatively. Please see my separate notes. She had undergone previous extensive abdominal surgeries and we have discussed the possibility of significant intra-abdominal adhesions and that might increase her risk for enterotomy  PROCEDURE: After obtaining informed consent and receiving 5000 units of subcutaneous heparin, the patient was brought to the operating room at [facility] and placed supine on the operating room table. General endotracheal anesthesia was established. Sequential compression devices were placed. A Foley catheter was placed. A orogastric tube was placed. The patient's abdomen was prepped and draped in the usual standard surgical fashion. She received preoperative IV antibiotics. A surgical timeout was performed.  Access to the abdomen was achieved using a 5 mm 0 laparoscope thru a 5 mm trocar In the right upper Quadrant 2 fingerbreadths below the right subcostal margin using the Optiview  technique because of her left-sided abdominal surgery involving a Hartman's procedure and colostomy takedown in years past.. Pneumoperitoneum was smoothly established up to 15 mm of mercury. The laparoscope was advanced and the abdominal cavity was surveilled. I was able to visualize the right lobe of the liver and the gallbladder however there is a different smell of what appeared to be omental adhesions inferiorly and medially. Despite several attempts to angle around this I was unable to get a window. Because we had achieved adequate pneumoperitoneum, a small incision was made in the left upper quadrant 2 fingerbreadths below the left subcostal margin and a 0 5 mm laparoscope was advanced through the abdominal wall with a 5 mm trocar in the abdominal cavity was entered. We were able to identify the left lobe of the liver. I was able to place another 5 mg trocar more medially in the subxiphoid position under direct visualization. The camera was placed in this trocar and I was able to visualize the left-sided trocar that I place. Also identified a free area in her left lateral abdominal wall. A 5 mm trocar was placed under direct visualization. We then started taking down some of the omental adhesions. I first went through the thin avascular adhesions with EndoShears. We identified a portion of her transverse colon which was tethered up. I was able to peel some of the omentum down from the abdominal wall. Some of the omentum was left tethered to the abdominal wall. She did have a defect at her old colostomy site &the omental plug was reduced. I had to reduce it in order to take down the adhesions in order to see the upper abdomen. We continued lysing adhesions with EndoShears as well as harmonic scalpel as well as blunt dissection. It  took approximately one hour to free all the adhesions so we could see the upper abdomen. Her PDS sutures were visible from her prior midline incision. There is some Swiss cheese  defects. We then placed her in reverse Trendelenburg. There was NO evidence of a hiatal hernia on laparoscopy - no gap in the left and right crus anteriorly.  A 5 mm trocar was placed slightly above and to the left of the umbilicus under direct visualization.  The [facility] liver retractor was placed under the left lobe of the liver through the previously placed 5 mm trocar  site in the subxiphoid position.  a 15 mm trocar in the mid right abdomen  All under direct visualization after local had been infiltrated.  The stomach was inspected. It was completely decompressed and the orogastric tube was removed.  There is no evidence of ongoing bleeding from the omentum which was lysed. Part of the falciform was taken down because part of the omentum was tethered to it.   We identified the pylorus and measured 6 cm proximal to the pylorus and identified an area of where we would start taking down the short gastric vessels. Harmonic scalpel was used to take down the short gastric vessels along the greater curvature of the stomach. We were able to enter the lesser sac. We continued to march along the greater curvature of the stomach taking down the short gastrics. As we approached the gastrosplenic ligament we took care in this area not to injure the spleen. We were able to take down the entire gastrosplenic ligament. We then mobilized the fundus away from the left crus of diaphragm. There were not any significant posterior gastric avascular attachments. This left the stomach completely mobilized. No vessels had been taken down along the lesser curvature of the stomach.  We then reidentified the pylorus. A 36Fr ViSiGi was then placed in the oropharynx and advanced down into the stomach and placed in the distal antrum and positioned along the lesser curvature. It was placed under suction which secured the 36Fr ViSiGi in place along the lesser curve. Then using the [employer] echelon 60 mm stapler with a green load with  Seamguard, I placed a stapler along the antrum approximately 5 cm from the pylorus. The stapler was angled so that there is ample room at the angularis incisura. I then fired the first staple load after inspecting it posteriorly to ensure adequate space both anteriorly and posteriorly. At this point I still was not completely past the angularis so with another green load with Seamguard, I placed the stapler in position just inside the prior stapleline. We then rotated the stomach to insure that there was adequate anteriorly as well as posteriorly. The stapler was then fired. At this point I started using 60 mm gold load staple cartridges with Seamguard. The echelon stapler was then repositioned with a 60 mm gold load with Seamguard and we continued to march up along the [place]. My assistant was holding traction along the greater curvature stomach along the cauterized short gastric vessels ensuring that the stomach was symmetrically retracted. Prior to each firing of the staple, we rotated the stomach to ensure that there is adequate stomach left.  As we approached the fundus, I used 60 mm blue cartridge with Seamguard aiming slightly lateral to the esophageal fat pad. Although the staples on this fire had completely gone thru the last part of the stomach it had not completely cut it. Therefore 1 additional 60 blue load was used  to free the remaining stomach. The sleeve was inspected. There is no evidence of cork screw. The staple line appeared hemostatic. The CRNA inflated the ViSiGi to the green zone and the upper abdomen was flooded with saline. There were no bubbles. The sleeve was decompressed and the ViSiGi removed. My assistant scrubbed out and performed an upper endoscopy. The sleeve easily distended with air and the scope was easily advanced to the pylorus. There is no evidence of internal bleeding or cork screwing. There was no narrowing at the angularis. There is no evidence of bubbles. Please see his  operative note for further details. The gastric sleeve was decompressed and the endoscope was removed.  The greater curvature the stomach was grasped with a laparoscopic grasper and removed from the 15 mm trocar site. I inspected the abdominal cavity. There is no evidence of enterotomies. There is no evidence of ongoing bleeding.  The liver retractor was removed. I then closed the 15 mm trocar site with 1 interrupted 0 Vicryl sutures through the fascia using the endoclose. The closure was viewed laparoscopically and it was airtight. 70 cc of Exparel was then infiltrated in the preperitoneal spaces around the trocar sites. Pneumoperitoneum was released. All trocar sites were closed with a 4-0 Monocryl in a subcuticular fashion followed by the application of liquid band exceed. The patient was extubated and taken to the recovery room in stable condition. All needle, instrument, and sponge counts were correct x2. There are no immediate complications  (2) 60 mm green with Seamguard (2) 60 mm gold with seamguard (2) 60 mm blue with seamguard  PLAN OF CARE: Admit to inpatient   PATIENT DISPOSITION:  PACU - hemodynamically stable.   Delay start of Pharmacological VTE agent (>24hrs) due to surgical blood loss or risk of bleeding:  no  Leighton Ruff. Redmond Pulling, MD, FACS General, Bariatric, & Minimally Invasive Surgery [facility], [place]

## 2014-02-13 NOTE — Transfer of Care (Signed)
Immediate Anesthesia Transfer of Care Note  Patient: [name]  Procedure(s) Performed: Procedure(s): LAPAROSCOPIC GASTRIC SLEEVE RESECTION LYSIS OF ADHESIONS, UPPER ENDOSCOPY (N/A)  Patient Location: PACU  Anesthesia Type:General  Level of Consciousness: awake, alert , oriented and patient cooperative  Airway & Oxygen Therapy: Patient Spontanous Breathing and Patient connected to face mask oxygen  Post-op Assessment: Report given to PACU RN and Post -op Vital signs reviewed and stable  Post vital signs: Reviewed and stable  Complications: No apparent anesthesia complications

## 2014-02-14 ENCOUNTER — Inpatient Hospital Stay (HOSPITAL_COMMUNITY): Payer: Medicare Other

## 2014-02-14 ENCOUNTER — Encounter (HOSPITAL_COMMUNITY): Payer: Self-pay | Admitting: General Surgery

## 2014-02-14 LAB — HEMOGLOBIN AND HEMATOCRIT, BLOOD
HCT: 35.5 % — ABNORMAL LOW (ref 36.0–46.0)
Hemoglobin: 11.2 g/dL — ABNORMAL LOW (ref 12.0–15.0)

## 2014-02-14 LAB — COMPREHENSIVE METABOLIC PANEL
ALT: 30 U/L (ref 0–35)
AST: 29 U/L (ref 0–37)
Albumin: 4.2 g/dL (ref 3.5–5.2)
Alkaline Phosphatase: 82 U/L (ref 39–117)
Anion gap: 6 (ref 5–15)
BUN: 8 mg/dL (ref 6–23)
CO2: 30 mmol/L (ref 19–32)
Calcium: 8.9 mg/dL (ref 8.4–10.5)
Chloride: 101 mEq/L (ref 96–112)
Creatinine, Ser: 0.64 mg/dL (ref 0.50–1.10)
GFR calc Af Amer: >90 mL/min (ref >90)
GFR calc non Af Amer: >90 mL/min (ref >90)
Glucose, Bld: 130 mg/dL — ABNORMAL HIGH (ref 70–99)
Potassium: 4.3 mmol/L (ref 3.5–5.1)
Sodium: 137 mmol/L (ref 135–145)
Total Bilirubin: 0.4 mg/dL (ref 0.3–1.2)
Total Protein: 7.2 g/dL (ref 6.0–8.3)

## 2014-02-14 LAB — CBC WITH DIFFERENTIAL/PLATELET
Basophils Absolute: 0 10*3/uL (ref 0.0–0.1)
Basophils Relative: 0 % (ref 0–1)
Eosinophils Absolute: 0 10*3/uL (ref 0.0–0.7)
Eosinophils Relative: 0 % (ref 0–5)
HCT: 39.4 % (ref 36.0–46.0)
Hemoglobin: 12.2 g/dL (ref 12.0–15.0)
Lymphocytes Relative: 14 % (ref 12–46)
Lymphs Abs: 1.7 10*3/uL (ref 0.7–4.0)
MCH: 27.9 pg (ref 26.0–34.0)
MCHC: 31 g/dL (ref 30.0–36.0)
MCV: 90.2 fL (ref 78.0–100.0)
Monocytes Absolute: 0.8 10*3/uL (ref 0.1–1.0)
Monocytes Relative: 7 % (ref 3–12)
Neutro Abs: 9.3 10*3/uL — ABNORMAL HIGH (ref 1.7–7.7)
Neutrophils Relative %: 79 % — ABNORMAL HIGH (ref 43–77)
Platelets: 317 10*3/uL (ref 150–400)
RBC: 4.37 MIL/uL (ref 3.87–5.11)
RDW: 13.8 % (ref 11.5–15.5)
WBC: 11.8 10*3/uL — ABNORMAL HIGH (ref 4.0–10.5)

## 2014-02-14 MED ORDER — LITHIUM CARBONATE 300 MG PO CAPS
600.0000 mg | ORAL_CAPSULE | Freq: Every day | ORAL | Status: DC
  Administered 2014-02-14 – 2014-02-15 (×2): 600 mg via ORAL
  Filled 2014-02-14 (×3): qty 2

## 2014-02-14 MED ORDER — OLANZAPINE 10 MG PO TABS
20.0000 mg | ORAL_TABLET | Freq: Every day | ORAL | Status: DC
  Administered 2014-02-14 – 2014-02-15 (×2): 20 mg via ORAL
  Filled 2014-02-14 (×3): qty 2

## 2014-02-14 MED ORDER — CLONAZEPAM 1 MG PO TABS
1.0000 mg | ORAL_TABLET | Freq: Two times a day (BID) | ORAL | Status: DC
  Administered 2014-02-14 – 2014-02-16 (×5): 1 mg via ORAL
  Filled 2014-02-14 (×5): qty 1

## 2014-02-14 MED ORDER — IOHEXOL 300 MG/ML  SOLN
50.0000 mL | Freq: Once | INTRAMUSCULAR | Status: AC | PRN
  Administered 2014-02-14: 50 mL via ORAL

## 2014-02-14 NOTE — Anesthesia Postprocedure Evaluation (Signed)
  Anesthesia Post-op Note  Patient: [name]  Procedure(s) Performed: Procedure(s) (LRB): LAPAROSCOPIC GASTRIC SLEEVE RESECTION LYSIS OF ADHESIONS, UPPER ENDOSCOPY (N/A)  Patient Location: PACU  Anesthesia Type: General  Level of Consciousness: awake and alert   Airway and Oxygen Therapy: Patient Spontanous Breathing  Post-op Pain: mild  Post-op Assessment: Post-op Vital signs reviewed, Patient's Cardiovascular Status Stable, Respiratory Function Stable, Patent Airway and No signs of Nausea or vomiting  Last Vitals:  Filed Vitals:   02/14/14 1000  BP: 131/91  Pulse: 99  Temp: 36.6 C  Resp: 18    Post-op Vital Signs: stable   Complications: No apparent anesthesia complications

## 2014-02-14 NOTE — Progress Notes (Signed)
Patient alert and oriented, Post op day 1.  Provided support and encouragement.  Encouraged pulmonary toilet, ambulation and small sips of liquids when swallow study returned satisfactorily.  All questions answered.  Will continue to monitor. 

## 2014-02-14 NOTE — Progress Notes (Signed)
Patient alert and oriented, pain is controlled. Patient is tolerating fluids, plan to advance to protein shake today.  Reviewed Gastric sleeve discharge instructions with patient and patient is able to articulate understanding.  Provided information on BELT program, Support Group and [facility] outpatient pharmacy. All questions answered, will continue to monitor.  

## 2014-02-14 NOTE — Plan of Care (Signed)
Problem: Food- and Nutrition-Related Knowledge Deficit (NB-1.1) Goal: Nutrition education Formal process to instruct or train a patient/client in a skill or to impart knowledge to help patients/clients voluntarily manage or modify food choices and eating behavior to maintain or improve health. Outcome: Completed/Met Date Met:  02/14/14 Nutrition Education Note  Received consult for diet education per DROP protocol.   S/p 12/22 Procedure(s): LAPAROSCOPIC GASTRIC SLEEVE RESECTION  Discussed 2 week post op diet with pt. Emphasized that liquids must be non carbonated, non caffeinated, and sugar free. Fluid goals discussed. Pt to follow up with outpatient bariatric RD for further diet progression after 2 weeks. Multivitamins and minerals also reviewed. Teach back method used, pt expressed understanding, expect good compliance.   Diet: First 2 Weeks  You will see the nutritionist about two (2) weeks after your surgery. The nutritionist will increase the types of foods you can eat if you are handling liquids well:  If you have severe vomiting or nausea and cannot handle clear liquids lasting longer than 1 day, call your surgeon  Protein Shake  Drink at least 2 ounces of shake 5-6 times per day  Each serving of protein shakes (usually 8 - 12 ounces) should have a minimum of:  15 grams of protein  And no more than 5 grams of carbohydrate  Goal for protein each day:  Men = 80 grams per day  Women = 60 grams per day  Protein powder may be added to fluids such as non-fat milk or Lactaid milk or Soy milk (limit to 35 grams added protein powder per serving)   Hydration  Slowly increase the amount of water and other clear liquids as tolerated (See Acceptable Fluids)  Slowly increase the amount of protein shake as tolerated  Sip fluids slowly and throughout the day  May use sugar substitutes in small amounts (no more than 6 - 8 packets per day; i.e. Splenda)   Fluid Goal  The first goal is to drink  at least 8 ounces of protein shake/drink per day (or as directed by the nutritionist); some examples of protein shakes are [employer], [employer], EAS Edge HP, and Unjury. See handout from pre-op Bariatric Education Class:  Slowly increase the amount of protein shake you drink as tolerated  You may find it easier to slowly sip shakes throughout the day  It is important to get your proteins in first  Your fluid goal is to drink 64 - 100 ounces of fluid daily  It may take a few weeks to build up to this  32 oz (or more) should be clear liquids  And  32 oz (or more) should be full liquids (see below for examples)  Liquids should not contain sugar, caffeine, or carbonation   Clear Liquids:  Water or Sugar-free flavored water (i.e. Fruit H2O, Propel)  Decaffeinated coffee or tea (sugar-free)  Crystal Lite, Wyler's Lite, Minute Maid Lite  Sugar-free Jell-O  Bouillon or broth  Sugar-free Popsicle: *Less than 20 calories each; Limit 1 per day   Full Liquids:  Protein Shakes/Drinks + 2 choices per day of other full liquids  Full liquids must be:  No More Than 12 grams of Carbs per serving  No More Than 3 grams of Fat per serving  Strained low-fat cream soup  Non-Fat milk  Fat-free Lactaid Milk  Sugar-free yogurt (Dannon Lite & Fit, Greek yogurt)     Clayton Bibles, MS, RD, LDN Pager: [phone] After Hours Pager: [phone]

## 2014-02-14 NOTE — Discharge Instructions (Signed)

## 2014-02-15 LAB — CBC WITH DIFFERENTIAL/PLATELET
Basophils Absolute: 0 10*3/uL (ref 0.0–0.1)
Basophils Relative: 0 % (ref 0–1)
Eosinophils Absolute: 0.1 10*3/uL (ref 0.0–0.7)
Eosinophils Relative: 1 % (ref 0–5)
HCT: 35.7 % — ABNORMAL LOW (ref 36.0–46.0)
Hemoglobin: 11.3 g/dL — ABNORMAL LOW (ref 12.0–15.0)
Lymphocytes Relative: 17 % (ref 12–46)
Lymphs Abs: 1.7 10*3/uL (ref 0.7–4.0)
MCH: 28.1 pg (ref 26.0–34.0)
MCHC: 31.7 g/dL (ref 30.0–36.0)
MCV: 88.8 fL (ref 78.0–100.0)
Monocytes Absolute: 0.8 10*3/uL (ref 0.1–1.0)
Monocytes Relative: 8 % (ref 3–12)
Neutro Abs: 7.6 10*3/uL (ref 1.7–7.7)
Neutrophils Relative %: 74 % (ref 43–77)
Platelets: 245 10*3/uL (ref 150–400)
RBC: 4.02 MIL/uL (ref 3.87–5.11)
RDW: 13.5 % (ref 11.5–15.5)
WBC: 10.2 10*3/uL (ref 4.0–10.5)

## 2014-02-15 MED ORDER — ZOLPIDEM TARTRATE 5 MG PO TABS: 5.0000 mg | ORAL_TABLET | Freq: Every evening | ORAL | Status: DC | PRN

## 2014-02-15 MED ORDER — LACTATED RINGERS IV BOLUS (SEPSIS)
1000.0000 mL | Freq: Once | INTRAVENOUS | Status: AC
  Administered 2014-02-15: 1000 mL via INTRAVENOUS

## 2014-02-15 MED ORDER — LACTATED RINGERS IV BOLUS (SEPSIS): 1000.0000 mL | Freq: Three times a day (TID) | INTRAVENOUS | Status: DC | PRN

## 2014-02-15 MED ORDER — PROMETHAZINE HCL 25 MG/ML IJ SOLN
12.5000 mg | INTRAMUSCULAR | Status: DC | PRN
  Administered 2014-02-15 – 2014-02-16 (×3): 12.5 mg via INTRAVENOUS
  Filled 2014-02-15 (×3): qty 1

## 2014-02-15 MED ORDER — ONDANSETRON HCL 4 MG/2ML IJ SOLN
4.0000 mg | INTRAMUSCULAR | Status: DC | PRN
  Administered 2014-02-15 – 2014-02-16 (×3): 4 mg via INTRAVENOUS
  Filled 2014-02-15 (×3): qty 2

## 2014-02-15 MED ORDER — ZOLPIDEM TARTRATE 10 MG PO TABS: 10.0000 mg | ORAL_TABLET | Freq: Every evening | ORAL | Status: DC | PRN

## 2014-02-15 MED ORDER — OMEPRAZOLE 2 MG/ML ORAL SUSPENSION: 40.0000 mg | Freq: Every day | ORAL | Status: DC

## 2014-02-15 MED ORDER — QUETIAPINE FUMARATE ER 400 MG PO TB24
800.0000 mg | ORAL_TABLET | Freq: Every day | ORAL | Status: DC
  Filled 2014-02-15 (×2): qty 2

## 2014-02-15 MED ORDER — PANTOPRAZOLE SODIUM 40 MG PO PACK
80.0000 mg | PACK | Freq: Every day | ORAL | Status: DC
  Administered 2014-02-15: 80 mg via ORAL
  Filled 2014-02-15 (×3): qty 40

## 2014-02-15 MED ORDER — PROMETHAZINE HCL 25 MG/ML IJ SOLN
12.5000 mg | Freq: Four times a day (QID) | INTRAMUSCULAR | Status: DC | PRN
  Administered 2014-02-15 (×2): 12.5 mg via INTRAVENOUS
  Filled 2014-02-15 (×2): qty 1

## 2014-02-15 NOTE — Progress Notes (Signed)
2 Days Post-Op  Subjective: Complains of nausea with water, she is only wanting IV pain medicine and zofran more than every 4 hours.   Nurses report she is gagging herself trying to vomit earlier. Objective: Vital signs in last 24 hours: Temp:  [97.8 F (36.6 C)-99 F (37.2 C)] 98 F (36.7 C) (12/24 0519) Pulse Rate:  [97-113] 109 (12/24 0519) Resp:  [18-22] 18 (12/24 0519) BP: (119-157)/(65-99) 149/92 mmHg (12/24 0519) SpO2:  [91 %-98 %] 98 % (12/24 0519) Last BM Date: 02/14/14 Nothing PO recorded. NPO Afebrile, VSS, BP up at times CBC is normal UGI shows no obstruction or leak yesterday Intake/Output from previous day: 12/23 0701 - 12/24 0700 In: 3033.3 [I.V.:3033.3] Out: 1900 [Urine:1900] Intake/Output this shift:    General appearance: alert, cooperative and no distress GI: soft sore few BS, she has had a BM.  sites look fine.  Lab Results:   Recent Labs  02/14/14 0453 02/14/14 1624 02/15/14 0525  WBC 11.8*  --  10.2  HGB 12.2 11.2* 11.3*  HCT 39.4 35.5* 35.7*  PLT 317  --  245    BMET  Recent Labs  02/14/14 0453  NA 137  K 4.3  CL 101  CO2 30  GLUCOSE 130*  BUN 8  CREATININE 0.64  CALCIUM 8.9   PT/INR No results for input(s): LABPROT, INR in the last 72 hours.   Recent Labs Lab 02/08/14 1010 02/14/14 0453  AST 18 29  ALT 23 30  ALKPHOS 89 82  BILITOT <0.2* 0.4  PROT 7.1 7.2  ALBUMIN 4.2 4.2     Lipase     Component Value Date/Time   LIPASE 46 10/12/2010 1322     Studies/Results: Dg Ugi W/water Sol Cm  02/14/2014   CLINICAL DATA:  Status post gastric sleeve procedure yesterday  EXAM: WATER SOLUBLE UPPER GI SERIES  TECHNIQUE: Single-column upper GI series was performed using water soluble contrast.  CONTRAST:  36mL OMNIPAQUE IOHEXOL 300 MG/ML  SOLN  COMPARISON:  None.  FLUOROSCOPY TIME:  0 min, 36 seconds.  FINDINGS: The patient was experiencing mild nausea prior to the study. The patient ingested 50 cc of Omnipaque 300 in 4  separate sips. There was no evidence of obstruction or leakage. Contrast promptly passed into the distal stomach and duodenum.  IMPRESSION: No obstruction or leakage at the level of the gastric sleeve procedure.   Electronically Signed   By: David  [place]   On: 02/14/2014 08:44    Medications: . clonazePAM  1 mg Oral BID  . enoxaparin (LOVENOX) injection  30 mg Subcutaneous Q12H  . lithium carbonate  600 mg Oral QHS  . OLANZapine  20 mg Oral QHS  . pantoprazole (PROTONIX) IV  40 mg Intravenous QHS  . protein supplement  2 oz Oral QID   Or  . protein supplement  2 oz Oral QID   Or  . protein supplement  2 oz Oral QID    Assessment/Plan Obesity with LAPAROSCOPIC SLEEVE GASTRECTOMY , LAPAROSCOPIC LYSIS OF ADHESIONS x 1 hour, UPPER GI ENDOSCOPY, 02/13/14, [name]. Anxiety disorder Depression Bipolar GERD Back pain Dyslipidemia Ventral and umbilical hernia repair.  Colon Removal - Partial2008 perf diverticulitis, Hartman's procedure; then colostomy reversal in 2008   Plan:  Stop iv pain meds, clear liquids for now, decrease IV rate and see how she does.      LOS: 2 days    [name] 02/15/2014

## 2014-02-16 MED ORDER — PROMETHAZINE HCL 12.5 MG PO TABS: 12.5000 mg | ORAL_TABLET | Freq: Three times a day (TID) | ORAL | Status: DC | PRN

## 2014-02-16 NOTE — Discharge Summary (Signed)
Reviewed discharge instructions with pt including follow-up meeting, medications, and precautions.  Pt without questions regarding d/c instructions or bariatric diet/education.  Pt being d/c into care of family.

## 2014-02-16 NOTE — Progress Notes (Signed)
General Surgery Note  LOS: 3 days  POD -  3 Days Post-Op  Assessment/Plan: 1.  LAPAROSCOPIC GASTRIC SLEEVE RESECTION LYSIS OF ADHESIONS, UPPER ENDOSCOPY - 02/13/2014 [name]  For morbid obesity - weight 320, BMI - 48.66  She is doing okay.  She is ready to go home.  2. DVT prophylaxis - Lovenox 3.  Anxiety disorder 4.  Back pain 5.  HTN 6.  Bipolar disease 7.  On chronic disability   Active Problems:   Morbid obesity  Subjective:  Doing better.  Complains of nausea.  She is blaming the narcotic, but I am not sure that it is not part of the operation.  She is also unsure of her follow up - she says she still has to organize her calender. Objective:   Filed Vitals:   02/16/14 0545  BP: 130/69  Pulse: 114  Temp: 99.3 F (37.4 C)  Resp: 18     Intake/Output from previous day:  12/24 0701 - 12/25 0700 In: 1515.7 [P.O.:474; I.V.:1041.7] Out: 1650 [Urine:1650]  Intake/Output this shift:      Physical Exam:   General: Obese WF who is alert and oriented.    HEENT: Normal. Pupils equal. .   Lungs: Clear.   Abdomen: Soft.  BS present.   Wound: Clean   Lab Results:    Recent Labs  02/14/14 0453 02/14/14 1624 02/15/14 0525  WBC 11.8*  --  10.2  HGB 12.2 11.2* 11.3*  HCT 39.4 35.5* 35.7*  PLT 317  --  245    BMET   Recent Labs  02/14/14 0453  NA 137  K 4.3  CL 101  CO2 30  GLUCOSE 130*  BUN 8  CREATININE 0.64  CALCIUM 8.9    PT/INR  No results for input(s): LABPROT, INR in the last 72 hours.  ABG  No results for input(s): PHART, HCO3 in the last 72 hours.  Invalid input(s): PCO2, PO2   Studies/Results:  No results found.   Anti-infectives:   Anti-infectives    Start     Dose/Rate Route Frequency Ordered Stop   02/13/14 0932  cefOXitin (MEFOXIN) 2 g in dextrose 5 % 50 mL IVPB     2 g100 mL/hr over 30 Minutes Intravenous On call to O.R. 02/13/14 0932 02/13/14 [place], MD, FACS Pager: [facility] [place] [facility] Office:  [phone] 02/16/2014

## 2014-02-16 NOTE — Discharge Summary (Signed)
Physician Discharge Summary  Patient ID:  [name]  MRN: [id number]  DOB/AGE: [id number] y.o.  Admit date: 02/13/2014 Discharge date: 02/16/2014  Discharge Diagnoses:  1.Morbid obesity - weight 320, BMI - 48.66  2. Anxiety disorder 3. Back pain 4. HTN 5. Bipolar disease 6. On chronic disability   Active Problems:   Morbid obesity  Operation: Procedure(s): LAPAROSCOPIC GASTRIC SLEEVE RESECTION LYSIS OF ADHESIONS, UPPER ENDOSCOPY on 02/13/2014 - E.Wilson.  Discharged Condition: good  Hospital Course: [name] is an 38 y.o. female whose primary care physician is Glo Herring., MD and who was admitted 02/13/2014 with a chief complaint of morbid obesity.   She was brought to the operating room on 02/13/2014 and underwent  LAPAROSCOPIC GASTRIC SLEEVE RESECTION LYSIS OF ADHESIONS, UPPER ENDOSCOPY.   She had a UGI swallow on the first post op day that looks good.  She has had nausea post op that she has blamed on the narcotics that she is taking, but I am not sure.  I think it may be a common complaint early after a sleeve gastrectomy.  She wants some oral Phenergan to go home with - which should be okay.  She knows that she has follow up appts, but she is not sure of the dates.  She will call and confirm follow up with the nutritionist and [name].  The discharge instructions were reviewed with the patient.  Consults: None  Significant Diagnostic Studies: Results for orders placed or performed during the hospital encounter of 02/13/14  Pregnancy, urine STAT morning of surgery  Result Value Ref Range   Preg Test, Ur NEGATIVE NEGATIVE  Hemoglobin and hematocrit, blood  Result Value Ref Range   Hemoglobin 11.8 (L) 12.0 - 15.0 g/dL   HCT 37.7 36.0 - 46.0 %  CBC WITH DIFFERENTIAL  Result Value Ref Range   WBC 11.8 (H) 4.0 - 10.5 K/uL   RBC 4.37 3.87 - 5.11 MIL/uL   Hemoglobin 12.2 12.0 - 15.0 g/dL   HCT 39.4 36.0 - 46.0 %   MCV 90.2 78.0 - 100.0 fL    MCH 27.9 26.0 - 34.0 pg   MCHC 31.0 30.0 - 36.0 g/dL   RDW 13.8 11.5 - 15.5 %   Platelets 317 150 - 400 K/uL   Neutrophils Relative % 79 (H) 43 - 77 %   Neutro Abs 9.3 (H) 1.7 - 7.7 K/uL   Lymphocytes Relative 14 12 - 46 %   Lymphs Abs 1.7 0.7 - 4.0 K/uL   Monocytes Relative 7 3 - 12 %   Monocytes Absolute 0.8 0.1 - 1.0 K/uL   Eosinophils Relative 0 0 - 5 %   Eosinophils Absolute 0.0 0.0 - 0.7 K/uL   Basophils Relative 0 0 - 1 %   Basophils Absolute 0.0 0.0 - 0.1 K/uL  Comprehensive metabolic panel  Result Value Ref Range   Sodium 137 135 - 145 mmol/L   Potassium 4.3 3.5 - 5.1 mmol/L   Chloride 101 96 - 112 mEq/L   CO2 30 19 - 32 mmol/L   Glucose, Bld 130 (H) 70 - 99 mg/dL   BUN 8 6 - 23 mg/dL   Creatinine, Ser 0.64 0.50 - 1.10 mg/dL   Calcium 8.9 8.4 - 10.5 mg/dL   Total Protein 7.2 6.0 - 8.3 g/dL   Albumin 4.2 3.5 - 5.2 g/dL   AST 29 0 - 37 U/L   ALT 30 0 - 35 U/L   Alkaline Phosphatase 82 39 -  117 U/L   Total Bilirubin 0.4 0.3 - 1.2 mg/dL   GFR calc non Af Amer >90 >90 mL/min   GFR calc Af Amer >90 >90 mL/min   Anion gap 6 5 - 15  Hemoglobin and hematocrit, blood  Result Value Ref Range   Hemoglobin 11.2 (L) 12.0 - 15.0 g/dL   HCT 35.5 (L) 36.0 - 46.0 %  CBC with Differential  Result Value Ref Range   WBC 10.2 4.0 - 10.5 K/uL   RBC 4.02 3.87 - 5.11 MIL/uL   Hemoglobin 11.3 (L) 12.0 - 15.0 g/dL   HCT 35.7 (L) 36.0 - 46.0 %   MCV 88.8 78.0 - 100.0 fL   MCH 28.1 26.0 - 34.0 pg   MCHC 31.7 30.0 - 36.0 g/dL   RDW 13.5 11.5 - 15.5 %   Platelets 245 150 - 400 K/uL   Neutrophils Relative % 74 43 - 77 %   Neutro Abs 7.6 1.7 - 7.7 K/uL   Lymphocytes Relative 17 12 - 46 %   Lymphs Abs 1.7 0.7 - 4.0 K/uL   Monocytes Relative 8 3 - 12 %   Monocytes Absolute 0.8 0.1 - 1.0 K/uL   Eosinophils Relative 1 0 - 5 %   Eosinophils Absolute 0.1 0.0 - 0.7 K/uL   Basophils Relative 0 0 - 1 %   Basophils Absolute 0.0 0.0 - 0.1 K/uL    Dg Ugi W/water Sol Cm  02/14/2014    CLINICAL DATA:  Status post gastric sleeve procedure yesterday  EXAM: WATER SOLUBLE UPPER GI SERIES  TECHNIQUE: Single-column upper GI series was performed using water soluble contrast.  CONTRAST:  75mL OMNIPAQUE IOHEXOL 300 MG/ML  SOLN  COMPARISON:  None.  FLUOROSCOPY TIME:  0 min, 36 seconds.  FINDINGS: The patient was experiencing mild nausea prior to the study. The patient ingested 50 cc of Omnipaque 300 in 4 separate sips. There was no evidence of obstruction or leakage. Contrast promptly passed into the distal stomach and duodenum.  IMPRESSION: No obstruction or leakage at the level of the gastric sleeve procedure.   Electronically Signed   By: Anuel Sitter  [place]   On: 02/14/2014 08:44    Discharge Exam:  Filed Vitals:   02/16/14 0545  BP: 130/69  Pulse: 114  Temp: 99.3 F (37.4 C)  Resp: 18    General: Obese WF who is alert.  Lungs: Clear to auscultation and symmetric breath sounds.  She has a good respiratory effort. Heart:  RRR. No murmur or rub. Abdomen: Soft. No mass.  Normal bowel sounds.  Incisions looks good.  Discharge Medications:     Medication List    TAKE these medications        clonazePAM 1 MG tablet  Commonly known as:  KLONOPIN  Take 1 mg by mouth 2 (two) times daily. Anxiety     ibuprofen 800 MG tablet  Commonly known as:  ADVIL,MOTRIN  Take 800 mg by mouth 3 (three) times daily as needed for moderate pain.     lisdexamfetamine 40 MG capsule  Commonly known as:  VYVANSE  Take 40 mg by mouth every morning.     lisinopril 20 MG tablet  Commonly known as:  PRINIVIL,ZESTRIL  Take 20 mg by mouth at bedtime.     lithium carbonate 300 MG capsule  Take 600 mg by mouth at bedtime.     NUVIGIL 250 MG tablet  Generic drug:  Armodafinil  Take 125 mg by mouth every morning.  OLANZapine 10 MG tablet  Commonly known as:  ZYPREXA  Take 20 mg by mouth at bedtime.     promethazine 12.5 MG tablet  Commonly known as:  PHENERGAN  Take 1-2 tablets (12.5-25 mg  total) by mouth every 8 (eight) hours as needed for nausea.     QUEtiapine 400 MG 24 hr tablet  Commonly known as:  SEROQUEL XR  Take 800 mg by mouth at bedtime.     valACYclovir 500 MG tablet  Commonly known as:  VALTREX  Take 1,000 mg by mouth daily as needed (fever blister).     zolpidem 10 MG tablet  Commonly known as:  AMBIEN  Take 10 mg by mouth at bedtime as needed for sleep.        Disposition: 01-Home or Self Care      Discharge Instructions    Increase activity slowly    Complete by:  As directed             Signed: Alphonsa Overall, M.D., [facility] Office:  [phone]  02/16/2014, 9:53 AM

## 2014-02-16 NOTE — Progress Notes (Signed)
Patient has been very sleepy.  c/o  feeling very sleepy and weak.  She refused her seroquel.  Said she might need some multivitamin.

## 2014-02-19 ENCOUNTER — Telehealth (HOSPITAL_COMMUNITY): Payer: Self-pay

## 2014-02-19 NOTE — Telephone Encounter (Addendum)
Attempted DROP DC phone call, no answer, left message to return call.   Made discharge phone call to patient per DROP protocol. Asking the following questions.    1. Do you have someone to care for you now that you are home?  yes 2. Are you having pain now that is not relieved by your pain medication?  no 3. Are you able to drink the recommended daily amount of fluids (48 ounces minimum/day) and protein (60-80 grams/day) as prescribed by the dietitian or nutritional counselor?  Yes 4. Are you taking the vitamins and minerals as prescribed?   yes 5. Do you have the "on call" number to contact your surgeon if you have a problem or question?  yes 6. Are your incisions free of redness, swelling or drainage? (If steri strips, address that these can fall off, shower as tolerated) yes 7. Have your bowels moved since your surgery?  If not, are you passing gas?  yes 8. Are you up and walking 3-4 times per day?  yes    1. Do you have an appointment made to see your surgeon in the next month?  yes 2. Were you provided your discharge medications before your surgery or before you were discharged from the hospital and are you taking them without problem?  yes 3. Were you provided phone numbers to the clinic/surgeon's office?  yes 4. Did you watch the patient education video module in the (clinic, surgeon's office, etc.) before your surgery? yes 5. Do you have a discharge checklist that was provided to you in the hospital to reference with instructions on how to take care of yourself after surgery?  yes 6. Did you see a dietitian or nutritional counselor while you were in the hospital?  yes 7. Do you have an appointment to see a dietitian or nutritional counselor in the next month?  yes

## 2014-02-20 ENCOUNTER — Telehealth: Payer: Self-pay | Admitting: Dietician

## 2014-02-20 ENCOUNTER — Telehealth (INDEPENDENT_AMBULATORY_CARE_PROVIDER_SITE_OTHER): Payer: Self-pay

## 2014-02-20 NOTE — Telephone Encounter (Signed)
Pt called to request a refill of her pain medication, Oxycodone 5mg /99ml.  It was last filled by [name] 12/03 with 25ml, take 5-10 ml every 4 hours PRN.  She had gastric sleeve surgery 12/22.  She states her pain level is 7-8 of 10 when her medication relief runs out.  She stated she  Is losing about 3 pounds a day. Do you want to refill it? Thanks.

## 2014-02-20 NOTE — Telephone Encounter (Signed)
Received message OK  to refill Oxycodone 5mg /ml #242ml; 5-10 ml every 4 hours as needed for pain from [name].  Will ask a physician in office to sign for the RX.

## 2014-02-20 NOTE — Telephone Encounter (Signed)
[name] called [facility] stating that she is feeling very hungry on her liquid diet. She has been having strained cream of chicken soup, premier protein shakes, sugar free jello, and Dannon [employer] and Fit yogurt. Tolerating pills and having no trouble swallowing and no vomiting. We discussed adding soft protein foods like scrambled eggs, deli meat, broiled seafood, and low fat cheese. I encouraged her to take small bites and chew thoroughly. The patient verbalized understanding.

## 2014-02-20 NOTE — Telephone Encounter (Signed)
Yes ok to refill. Oxycodone (5mg /85ml) 5 -10 ml po q 4hrs prn pain; #200 mL.  It is a prelisted medication in allscripts. Please ask someone to sign Rx.

## 2014-02-27 ENCOUNTER — Encounter: Payer: Medicare Other | Attending: General Surgery

## 2014-02-27 DIAGNOSIS — Z6841 Body Mass Index (BMI) 40.0 and over, adult: Secondary | ICD-10-CM | POA: Diagnosis not present

## 2014-02-27 DIAGNOSIS — Z9884 Bariatric surgery status: Secondary | ICD-10-CM | POA: Insufficient documentation

## 2014-02-27 DIAGNOSIS — Z713 Dietary counseling and surveillance: Secondary | ICD-10-CM | POA: Insufficient documentation

## 2014-02-27 DIAGNOSIS — F3160 Bipolar disorder, current episode mixed, unspecified: Secondary | ICD-10-CM | POA: Diagnosis not present

## 2014-02-27 NOTE — Progress Notes (Signed)
Bariatric Class:  Appt start time: 1530 end time:  1630.  2 Week Post-Operative Nutrition Class  Patient was seen on 02/27/14 for Post-[facility]ative Nutrition [facility].   Surgery date: 02/13/2014 Surgery type: Sleeve Start weight at [facility]: 314 lbs on 06/30/2013 Weight today: 295.0 lbs  Weight change: 25 lbs  TANITA  BODY COMP RESULTS  01/29/14 02/27/14   BMI (kg/m^2) 48.7 44.9   Fat Mass (lbs) 171.5 157.0   Fat Free Mass (lbs) 148.5 138.0   Total Body Water (lbs) 108.5 101.0    The following the learning objectives were met by the patient during this course:  Identifies Phase 3A (Soft, High Proteins) Dietary Goals and will begin from 2 weeks post-operatively to 2 months post-operatively  Identifies appropriate sources of fluids and proteins   States protein recommendations and appropriate sources post-operatively  Identifies the need for appropriate texture modifications, mastication, and bite sizes when consuming solids  Identifies appropriate multivitamin and calcium sources post-operatively  Describes the need for physical activity post-operatively and will follow MD recommendations  States when to call healthcare provider regarding medication questions or post-operative complications  Handouts given during class include:  Phase 3A: Soft, High Protein Diet Handout  Follow-Up Plan: Patient will follow-up at [facility] in 6 weeks for 2 month post-op nutrition visit for diet advancement per MD.

## 2014-03-20 DIAGNOSIS — F319 Bipolar disorder, unspecified: Secondary | ICD-10-CM | POA: Diagnosis not present

## 2014-04-05 ENCOUNTER — Other Ambulatory Visit: Payer: Self-pay | Admitting: Obstetrics and Gynecology

## 2014-04-11 ENCOUNTER — Encounter: Payer: Medicare Other | Attending: General Surgery | Admitting: Dietician

## 2014-04-11 VITALS — Ht 68.0 in | Wt 270.5 lb

## 2014-04-11 DIAGNOSIS — F17200 Nicotine dependence, unspecified, uncomplicated: Secondary | ICD-10-CM | POA: Diagnosis not present

## 2014-04-11 DIAGNOSIS — Z713 Dietary counseling and surveillance: Secondary | ICD-10-CM | POA: Diagnosis not present

## 2014-04-11 NOTE — Patient Instructions (Addendum)
-  Work on eating at least 3x a day -Take tiny bites, chew thoroughly, and eat slowly -Avoid anything carbonated -Try another type of Calcium  -Chicken and beans -Ground beef (meat sauce, meat balls) -Low fat cheese (Laughing cow) -[place] sausage  TANITA  BODY COMP RESULTS  01/29/14 02/27/14 04/11/14   BMI (kg/m^2) 48.7 44.9 41.1   Fat Mass (lbs) 171.5 157.0 138.5   Fat Free Mass (lbs) 148.5 138.0 132   Total Body Water (lbs) 108.5 101.0 96.5

## 2014-04-11 NOTE — Progress Notes (Signed)
  Follow-up visit:  8 Weeks Post-Operative Gastric Sleeve Surgery  Medical Nutrition Therapy:  Appt start time: 9753 end time:  0920.  Primary concerns today: Post-operative Bariatric Surgery Nutrition Management. [name] returns today reporting she is not feeling well this morning. She states she ate 4 mini chicken biscuits this morning and feels miserable. She is having chronic nausea. Eating bread, biscuits and pasta regularly. Also drinking diet sodas but these make her sick. Usually skips breakfast. Also having pain in her right side. She has had vomiting with chicken and beans; sometimes feels like she eats too much. Seeing her therapist and discussing reasons why she eats too much.  Surgery date: 02/13/2014 Surgery type: Sleeve Start weight at [facility]: 314 lbs on 06/30/2013 Weight today: 270.5 lbs Weight change: 25 lbs Total weight lost: 43.5 lbs  TANITA  BODY COMP RESULTS  01/29/14 02/27/14 04/11/14   BMI (kg/m^2) 48.7 44.9 41.1   Fat Mass (lbs) 171.5 157.0 138.5   Fat Free Mass (lbs) 148.5 138.0 132   Total Body Water (lbs) 108.5 101.0 96.5    Preferred Learning Style:   No preference indicated   Learning Readiness:   Contemplating  24-hr recall: Unable to accurately assess.  Fluid intake: 64 ounces Estimated total protein intake: unable to assess  Medications: see list Supplementation: inconsistent  Drinking while eating: no Hair loss: some Carbonated beverages: diet sodas N/V/D/C: regurgitation sometimes  Recent physical activity:  none  Progress Towards Goal(s):  Some progress.   Nutritional Diagnosis:  [place]-3.3 Overweight/obesity related to past poor dietary habits and physical inactivity as evidenced by patient w/ recent gastric sleeve surgery following dietary guidelines for continued weight loss.     Intervention:  Nutrition counseling provided. Reiterated nutritional recommendations for post op bariatric surgery. Emphasized importance of small, high protein  meals throughout the day. Encouraged eating slowly, tiny bites, and chewing thoroughly. Demonstrated appropriate serving sizes using food models. Brainstormed well-tolerated foods with the patient.    Teaching Method Utilized:  Visual Auditory Hands on  Barriers to learning/adherence to lifestyle change: food preferences and difficulty controlling portions  Demonstrated degree of understanding via:  Teach Back   Monitoring/Evaluation:  Dietary intake, exercise, and body weight. Follow up in 4 months for 3 month post-op visit.

## 2014-04-18 DIAGNOSIS — F319 Bipolar disorder, unspecified: Secondary | ICD-10-CM | POA: Diagnosis not present

## 2014-04-20 DIAGNOSIS — Z01419 Encounter for gynecological examination (general) (routine) without abnormal findings: Secondary | ICD-10-CM | POA: Diagnosis not present

## 2014-04-20 DIAGNOSIS — Z779 Other contact with and (suspected) exposures hazardous to health: Secondary | ICD-10-CM | POA: Diagnosis not present

## 2014-04-23 DIAGNOSIS — F3160 Bipolar disorder, current episode mixed, unspecified: Secondary | ICD-10-CM | POA: Diagnosis not present

## 2014-04-24 DIAGNOSIS — I10 Essential (primary) hypertension: Secondary | ICD-10-CM | POA: Diagnosis not present

## 2014-04-30 ENCOUNTER — Other Ambulatory Visit (INDEPENDENT_AMBULATORY_CARE_PROVIDER_SITE_OTHER): Payer: Self-pay

## 2014-04-30 ENCOUNTER — Other Ambulatory Visit (INDEPENDENT_AMBULATORY_CARE_PROVIDER_SITE_OTHER): Payer: Self-pay | Admitting: General Surgery

## 2014-04-30 DIAGNOSIS — R1011 Right upper quadrant pain: Secondary | ICD-10-CM

## 2014-05-03 ENCOUNTER — Other Ambulatory Visit: Payer: Self-pay | Admitting: Radiology

## 2014-05-03 ENCOUNTER — Other Ambulatory Visit (INDEPENDENT_AMBULATORY_CARE_PROVIDER_SITE_OTHER): Payer: Self-pay | Admitting: General Surgery

## 2014-05-03 ENCOUNTER — Ambulatory Visit (HOSPITAL_COMMUNITY)
Admission: RE | Admit: 2014-05-03 | Discharge: 2014-05-03 | Disposition: A | Discharge status: Home / Self Care | Payer: Medicare Other | Source: Ambulatory Visit | Attending: General Surgery | Admitting: General Surgery

## 2014-05-03 ENCOUNTER — Encounter (HOSPITAL_COMMUNITY): Payer: Self-pay

## 2014-05-03 ENCOUNTER — Other Ambulatory Visit (HOSPITAL_COMMUNITY): Payer: Self-pay | Admitting: General Surgery

## 2014-05-03 DIAGNOSIS — R1031 Right lower quadrant pain: Secondary | ICD-10-CM

## 2014-05-03 DIAGNOSIS — R110 Nausea: Secondary | ICD-10-CM

## 2014-05-03 DIAGNOSIS — K439 Ventral hernia without obstruction or gangrene: Secondary | ICD-10-CM | POA: Diagnosis not present

## 2014-05-03 DIAGNOSIS — IMO0002 Reserved for concepts with insufficient information to code with codable children: Secondary | ICD-10-CM

## 2014-05-03 MED ORDER — IOHEXOL 300 MG/ML  SOLN
100.0000 mL | Freq: Once | INTRAMUSCULAR | Status: AC | PRN
  Administered 2014-05-03: 100 mL via INTRAVENOUS

## 2014-05-04 ENCOUNTER — Encounter (HOSPITAL_COMMUNITY): Payer: Self-pay

## 2014-05-04 ENCOUNTER — Ambulatory Visit (HOSPITAL_COMMUNITY)
Admission: RE | Admit: 2014-05-04 | Discharge: 2014-05-04 | Disposition: A | Discharge status: Home / Self Care | Payer: Medicare Other | Source: Ambulatory Visit | Attending: General Surgery | Admitting: General Surgery

## 2014-05-04 DIAGNOSIS — N732 Unspecified parametritis and pelvic cellulitis: Secondary | ICD-10-CM | POA: Diagnosis not present

## 2014-05-04 DIAGNOSIS — R110 Nausea: Secondary | ICD-10-CM | POA: Diagnosis not present

## 2014-05-04 DIAGNOSIS — Z79899 Other long term (current) drug therapy: Secondary | ICD-10-CM | POA: Diagnosis not present

## 2014-05-04 DIAGNOSIS — K219 Gastro-esophageal reflux disease without esophagitis: Secondary | ICD-10-CM | POA: Diagnosis not present

## 2014-05-04 DIAGNOSIS — G8918 Other acute postprocedural pain: Secondary | ICD-10-CM | POA: Insufficient documentation

## 2014-05-04 DIAGNOSIS — Z87891 Personal history of nicotine dependence: Secondary | ICD-10-CM | POA: Diagnosis not present

## 2014-05-04 DIAGNOSIS — IMO0002 Reserved for concepts with insufficient information to code with codable children: Secondary | ICD-10-CM | POA: Insufficient documentation

## 2014-05-04 DIAGNOSIS — R109 Unspecified abdominal pain: Secondary | ICD-10-CM | POA: Insufficient documentation

## 2014-05-04 DIAGNOSIS — Z9884 Bariatric surgery status: Secondary | ICD-10-CM | POA: Diagnosis not present

## 2014-05-04 LAB — PROTIME-INR
INR: 1.26 (ref 0.00–1.49)
Prothrombin Time: 16 seconds — ABNORMAL HIGH (ref 11.6–15.2)

## 2014-05-04 LAB — CBC WITH DIFFERENTIAL/PLATELET
Basophils Absolute: 0 10*3/uL (ref 0.0–0.1)
Basophils Relative: 0 % (ref 0–1)
Eosinophils Absolute: 0.2 10*3/uL (ref 0.0–0.7)
Eosinophils Relative: 3 % (ref 0–5)
HCT: 28.4 % — ABNORMAL LOW (ref 36.0–46.0)
Hemoglobin: 8.7 g/dL — ABNORMAL LOW (ref 12.0–15.0)
Lymphocytes Relative: 18 % (ref 12–46)
Lymphs Abs: 1.2 10*3/uL (ref 0.7–4.0)
MCH: 24.2 pg — ABNORMAL LOW (ref 26.0–34.0)
MCHC: 30.6 g/dL (ref 30.0–36.0)
MCV: 78.9 fL (ref 78.0–100.0)
Monocytes Absolute: 0.6 10*3/uL (ref 0.1–1.0)
Monocytes Relative: 8 % (ref 3–12)
Neutro Abs: 5.1 10*3/uL (ref 1.7–7.7)
Neutrophils Relative %: 71 % (ref 43–77)
Platelets: 586 10*3/uL — ABNORMAL HIGH (ref 150–400)
RBC: 3.6 MIL/uL — ABNORMAL LOW (ref 3.87–5.11)
RDW: 14.1 % (ref 11.5–15.5)
WBC: 7.1 10*3/uL (ref 4.0–10.5)

## 2014-05-04 MED ORDER — FENTANYL CITRATE 0.05 MG/ML IJ SOLN
INTRAMUSCULAR | Status: AC | PRN
  Administered 2014-05-04: 100 ug via INTRAVENOUS

## 2014-05-04 MED ORDER — MIDAZOLAM HCL 2 MG/2ML IJ SOLN
INTRAMUSCULAR | Status: AC
  Filled 2014-05-04: qty 4

## 2014-05-04 MED ORDER — MIDAZOLAM HCL 2 MG/2ML IJ SOLN
INTRAMUSCULAR | Status: AC | PRN
  Administered 2014-05-04: 2 mg via INTRAVENOUS

## 2014-05-04 MED ORDER — LIDOCAINE HCL (PF) 1 % IJ SOLN
INTRAMUSCULAR | Status: AC
  Filled 2014-05-04: qty 10

## 2014-05-04 MED ORDER — SODIUM CHLORIDE 0.9 % IV SOLN
INTRAVENOUS | Status: DC
  Administered 2014-05-04: 12:00:00 via INTRAVENOUS

## 2014-05-04 MED ORDER — FENTANYL CITRATE 0.05 MG/ML IJ SOLN
INTRAMUSCULAR | Status: AC
  Filled 2014-05-04: qty 4

## 2014-05-04 NOTE — Discharge Instructions (Signed)
Bulb Drain Home Care  A bulb drain consists of a thin rubber tube and a soft, round bulb that creates a gentle suction. The rubber tube is placed in the area where you had surgery. A bulb is attached to the end of the tube that is outside the body. The bulb drain removes excess fluid that normally builds up in a surgical wound after surgery. The color and amount of fluid will vary. Immediately after surgery, the fluid is bright red and is a little thicker than water. It may gradually change to a yellow or pink color and become more thin and water-like. When the amount decreases to about 1 or 2 tbsp in 24 hours, your health care provider will usually remove it. DAILY CARE  Keep the bulb flat (compressed) at all times, except while emptying it. The flatness creates suction. You can flatten the bulb by squeezing it firmly in the middle and then closing the cap.  Keep sites where the tube enters the skin dry and covered with a bandage (dressing).  Secure the tube 1-2 in (2.5-5.1 cm) below the insertion sites to keep it from pulling on your stitches. The tube is stitched in place and will not slip out.  Secure the bulb as directed by your health care provider.  For the first 3 days after surgery, there usually is more fluid in the bulb. Empty the bulb whenever it becomes half full because the bulb does not create enough suction if it is too full. The bulb could also overflow. Write down how much fluid you remove each time you empty your drain. Add up the amount removed in 24 hours.  Empty the bulb at the same time every day once the amount of fluid decreases and you only need to empty it once a day. Write down the amounts and the 24-hour totals to give to your health care provider. This helps your health care provider know when the tubes can be removed. EMPTYING THE BULB DRAIN Before emptying the bulb, get a measuring cup, a piece of paper and a pen, and wash your hands.  Gently run your fingers down  the tube (stripping) to empty any drainage from the tubing into the bulb. This may need to be done several times a day to clear the tubing of clots and tissue.  Open the bulb cap to release suction, which causes it to inflate. Do not touch the inside of the cap.  Gently run your fingers down the tube (stripping) to empty any drainage from the tubing into the bulb.  Hold the cap out of the way, and pour fluid into the measuring cup.   Squeeze the bulb to provide suction.  Replace the cap.   Check the tape that holds the tube to your skin. If it is becoming loose, you can remove the loose piece of tape and apply a new one. Then, pin the bulb to your shirt.   Write down the amount of fluid you emptied out. Write down the date and each time you emptied your bulb drain. (If there are 2 bulbs, note the amount of drainage from each bulb and keep the totals separate. Your health care provider will want to know the total amounts for each drain and which tube is draining more.)   Flush the fluid down the toilet and wash your hands.   Call your health care provider once you have less than 2 tbsp of fluid collecting in the bulb drain every 24 hours.  If there is drainage around the tube site, change dressings and keep the area dry. Cleanse around tube with sterile saline and place dry gauze around site. This gauze should be changed when it is soiled. If it stays clean and unsoiled, it should still be changed daily.  SEEK MEDICAL CARE IF: 1. Your drainage has a bad smell or is cloudy.  2. You have a fever.  3. Your drainage is increasing instead of decreasing.  4. Your tube fell out.  5. You have redness or swelling around the tube site.  6. You have drainage from a surgical wound.  7. Your bulb drain will not stay flat after you empty it.  MAKE SURE YOU:   Understand these instructions.  Will watch your condition.  Will get help right away if you are not doing well or get  worse. Document Released: 02/07/2000 Document Revised: 06/26/2013 Document Reviewed: 07/15/2011 [facility] Patient Information 2015 [place], [place]. This information is not intended to replace advice given to you by your health care provider. Make sure you discuss any questions you have with your health care provider.       JP Drain [employer] this sheet to all of your post-operative appointments while you have your drains.  Please measure your drains by CC's or ML's.  Make sure you drain and measure your JP Drains 3 times per day.  At the end of each day, add up totals for the left side and add up totals for the right side.     ( 9 am )     ( 3 pm )        ( 9 pm )                Date L  R  L  R  L  R  Total L/R                                                                                                                                                                                       JP Drain Totals   Bring this sheet to all of your post-operative appointments while you have your drains.  Please measure your drains by CC's or ML's.  Make sure you drain and measure your JP Drains 3 times per day.  At the end of each day, add up totals for the left side and add up totals for the right side.     ( 9 am )     ( 3 pm )        ( 9 pm )  Date L  R  L  R  L  R  Total L/R

## 2014-05-04 NOTE — Procedures (Signed)
Interventional Radiology Procedure Note  Procedure: Placement of 34F drain into anterior abdominal wall seroma.  1L dark red-brown fluid aspirated.  Appearance looks like old hematoma.   Complications: None  Estimated Blood Loss: None  Recommendations: - Drain to JP bulb for 2 weeks - Will follow in drain clinic with f/u US  Signed,  Criselda Peaches, MD

## 2014-05-04 NOTE — H&P (Signed)
Chief Complaint: "I am here to have a drain."  Referring Physician(s): Wilson,Eric  History of Present Illness: [name] is a 39 y.o. female who is s/p gastric sleeve on 01/2014. Post op the patient has been c/o abdominal pain, nausea and fatigue x 4 weeks. The patient had f/u with [name] and CT revealed large fluid collection within subcutaneous tissue along ventral abdominal wall. She has been scheduled today for image guided drain placement. She denies any chest pain, shortness of breath or palpitations. She denies any active signs of bleeding or excessive bruising. The patient denies any history of sleep apnea or chronic oxygen use. She has previously tolerated sedation without complications.    Past Medical History  Diagnosis Date  . GERD (gastroesophageal reflux disease)   . Diverticulitis 2008    perforated/ requiring resection  . Colostomy in place   . Bipolar affective   . Depression   . Anxiety   . Asthma   . Arthritis   . Blood transfusion without reported diagnosis   . Substance abuse     12 years ago-crack cocaine    Past Surgical History  Procedure Laterality Date  . Exploratory laparotomy with resection  2008  . Colostomy reversal  11/2006  . Abdominal wall hernia  07/2007    open repair with lysis of adhesions  . Hx of abd wall seroma  08/2007    Drained via [place] in [place], [place]  . Hx of abd wall seroma  10/2007    Drained by Dr. Geroge Baseman in office  . Tonsillectomy    . Kidney stones    . Hernia repair    . Examination under anesthesia N/A 05/11/2012    Procedure: EXAM UNDER ANESTHESIA;  Surgeon: Donato Heinz, MD;  Location: [facility];  Service: General;  Laterality: N/A;  . Placement of seton N/A 05/11/2012    Procedure: PLACEMENT OF SETON;  Surgeon: Donato Heinz, MD;  Location: [facility];  Service: General;  Laterality: N/A;  . Treatment fistula anal    . Finger closed reduction Right 08/05/2012    Procedure: CLOSED REDUCTION RIGHT THUMB  (FINGER);  Surgeon: Linna Hoff, MD;  Location: [place];  Service: Orthopedics;  Laterality: Right;  . Esophagogastroduodenoscopy  03/13/2010    GLO:[id number] esophagus, status post passage of a Maloney dilator/Small hiatal hernia/ Antral erosions, status post biopsy  . Colonoscopy  03/13/2010    SAY:[id number] hemorrhoids likely cause of hematochezia, otherwise normal  . Laparoscopic gastric sleeve resection N/A 02/13/2014    Procedure: LAPAROSCOPIC GASTRIC SLEEVE RESECTION LYSIS OF ADHESIONS, UPPER ENDOSCOPY;  Surgeon: [name], MD;  Location: [facility];  Service: General;  Laterality: N/A;    Allergies: Review of patient's allergies indicates no known allergies.  Medications: Prior to Admission medications   Medication Sig Start Date End Date Taking? Authorizing Provider  Armodafinil (NUVIGIL) 250 MG tablet Take 125 mg by mouth every morning.    Yes Historical Provider, MD  clonazePAM (KLONOPIN) 1 MG tablet Take 1 mg by mouth 2 (two) times daily.    Yes Historical Provider, MD  lisdexamfetamine (VYVANSE) 40 MG capsule Take 40 mg by mouth every morning.   Yes Historical Provider, MD  promethazine (PHENERGAN) 12.5 MG tablet Take 1-2 tablets (12.5-25 mg total) by mouth every 8 (eight) hours as needed for nausea. 02/16/14  Yes Alphonsa Overall, MD  QUEtiapine (SEROQUEL XR) 400 MG 24 hr tablet Take 800 mg by mouth at bedtime.   Yes  Historical Provider, MD  valACYclovir (VALTREX) 500 MG tablet Take 1,000 mg by mouth daily as needed (fever blister).  03/01/13  Yes Historical Provider, MD  zolpidem (AMBIEN) 10 MG tablet Take 10 mg by mouth at bedtime.    Yes Historical Provider, MD     Family History  Problem Relation Age of Onset  . Asthma Maternal Grandmother     History   Social History  . Marital Status: Divorced    Spouse Name: N/A  . Number of Children: 2  . Years of Education: N/A   Occupational History  . Dental Assistant    Social History Main Topics  . Smoking status:  Former Smoker -- 0.50 packs/day for 14 years    Types: E-cigarettes    Quit date: 12/20/2013  . Smokeless tobacco: Current User  . Alcohol Use: No  . Drug Use: No  . Sexual Activity: Not on file   Other Topics Concern  . None   Social History Narrative   Review of Systems: A 12 point ROS discussed and pertinent positives are indicated in the HPI above.  All other systems are negative.  Review of Systems  Vital Signs: BP 137/79 mmHg  Pulse 102  Temp(Src) 98 F (36.7 C) (Oral)  Resp 18  Ht 5\' 8"  (1.727 m)  Wt 270 lb (122.471 kg)  BMI 41.06 kg/m2  SpO2 96%  Physical Exam  Constitutional: She is oriented to person, place, and time. No distress.  HENT:  Head: Normocephalic and atraumatic.  Neck: No tracheal deviation present.  Cardiovascular: Normal rate and regular rhythm.  Exam reveals no gallop and no friction rub.   No murmur heard. Pulmonary/Chest: Effort normal and breath sounds normal. No respiratory distress. She has no wheezes. She has no rales.  Abdominal: Soft. Bowel sounds are normal. She exhibits distension. There is tenderness.  Neurological: She is alert and oriented to person, place, and time.  Skin: She is not diaphoretic.    Mallampati Score:  MD Evaluation Airway: WNL Heart: WNL Abdomen: WNL Chest/ Lungs: WNL ASA  Classification: 2 Mallampati/Airway Score: Two  Imaging: Ct Abdomen Pelvis W Contrast  05/03/2014   CLINICAL DATA:  Abdominal pain. Evaluate gallbladder and possible hernia.  EXAM: CT ABDOMEN AND PELVIS WITH CONTRAST  TECHNIQUE: Multidetector CT imaging of the abdomen and pelvis was performed using the standard protocol following bolus administration of intravenous contrast.  CONTRAST:  128mL OMNIPAQUE IOHEXOL 300 MG/ML  SOLN  COMPARISON:  CT abdomen pelvis - 03/09/2011  FINDINGS: There is a large (approximately 19.6 x 10.0 x 18.2 cm) peripherally enhancing fluid collection within the subcutaneous tissues immediately anterior to the ventral  wall of the abdomen. Note is made of a punctate calcification within the caudal aspect of this fluid collection (size of image 80, series 4) as well as a minimal amount of macroscopic fat (axial image 62, series 2).  This large fluid collection appears deep to a prior midline incision. Additionally, there is a ill-defined linear area of soft tissue stranding extending to the dermal surface of the right mid hemi abdomen (representative images 43 through 45, series 2), potentially represent of a prior laparotomy port.  There is a very small (approximately 2.6 x 3.9 cm mesenteric fat containing ventral midline hernia within the lower abdomen/upper pelvis caudal to this dominant fluid collection (axial image 78, series 2, sagittal image 76, series 4).  -------------------------------------------------------------------  Normal hepatic contour. No discrete hepatic lesions. Normal appearance of the gallbladder given degree distention. No definite  radiopaque gallstones. No gallbladder wall thickening or pericholecystic fluid. No intra or extrahepatic biliary duct dilatation. No ascites.  There is symmetric enhancement of the bilateral kidneys. No definite renal stones this postcontrast examination. No discrete renal lesions. There is a very minimal amount of likely body habitus related symmetric perinephric stranding. No urinary obstruction. Normal appearance of the bilateral adrenal glands, pancreas and spleen.  Ingested enteric contrast extends to the level of the rectum. The patient has undergone gastric sleeve surgery. The bowel is otherwise normal in course and caliber without wall thickening or evidence of obstruction no pneumoperitoneum, pneumatosis or portal venous gas. No definable/drainable intraperitoneal/abdominal fluid collection.  Normal caliber the abdominal aorta. The major branch vessels of the abdominal aorta appear patent on this non CTA examination.  Scattered shotty porta hepatis and retroperitoneal  lymph nodes are individually not enlarged by size criteria with index aortocaval lymph node measuring 0.8 cm in greatest short axis diameter (image 49, series 2, presumably secondary to patient body habitus.  An intrauterine device appears appropriately positioned within the endometrial canal. Otherwise, normal appearance of the pelvic organs. No discrete adnexal lesion. Normal appearance of the urinary bladder given underdistention. No free fluid in the pelvic cul-de-sac.  Limited visualization of the lower thorax demonstrates a punctate (approximately 4 mm) subpleural nodule within the imaged right lower lobe favored to be of prior infection are inflammatory etiology in this young patient. There is minimal nodular pleural thickening within the dependent portion of the left lower lobe. No focal airspace opacities. No pleural effusion.  Normal heart size.  No pericardial effusion.  No acute or aggressive osseous abnormalities.  IMPRESSION: 1. Large (approximately 18 cm) peripherally enhancing fluid collection within the subcutaneous tissues along the ventral abdominal wall, deep to a prior midline incision - while nonspecific, given lack of significant adjacent inflammation as well as a minimal amount internal macroscopic fat and dystrophic calcification, this fluid collection is favored to represent a postoperative seroma though note underlying infection is not excluded on the basis of this examination. 2. Small (approximately 2.6 x 3.9 cm) mesenteric fat containing ventral midline hernia within the lower abdomen/upper pelvis caudal to the dominant fluid collection 3. Post gastric sleeve surgery without evidence of enteric obstruction. No intra peritoneal/abdominal definable/drainable fluid collection. These results will be called to the ordering clinician or representative by the Radiologist Assistant, and communication documented in the PACS or zVision Dashboard.   Electronically Signed   By: Sandi Mariscal M.D.    On: 05/03/2014 10:22    Labs:  CBC:  Recent Labs  02/08/14 1010  02/14/14 0453 02/14/14 1624 02/15/14 0525 05/04/14 1145  WBC 7.9  --  11.8*  --  10.2 7.1  HGB 12.6  < > 12.2 11.2* 11.3* 8.7*  HCT 40.4  < > 39.4 35.5* 35.7* 28.4*  PLT 259  --  317  --  245 586*  < > = values in this interval not displayed.  COAGS:  Recent Labs  05/04/14 1145  INR 1.26    BMP:  Recent Labs  09/01/13 0001 02/08/14 1010 02/14/14 0453  NA 136 137 137  K 4.1 4.0 4.3  CL 99 99 101  CO2 29 26 30   GLUCOSE 129* 77 130*  BUN 9 14 8   CALCIUM 8.9 9.3 8.9  CREATININE 0.69 0.61 0.64  GFRNONAA  --  >90 >90  GFRAA  --  >90 >90    LIVER FUNCTION TESTS:  Recent Labs  09/01/13 0001 02/08/14 1010 02/14/14  0453  BILITOT 0.2 <0.2* 0.4  AST 11 18 29   ALT 19 23 30   ALKPHOS 80 89 82  PROT 6.2 7.1 7.2  ALBUMIN 4.0 4.2 4.2   Assessment and Plan: S/p gastric sleeve 01/2014  Post op abdominal pain, nausea and fatigue x 4 weeks F/u with [name], CT revealed large fluid collection within subcutaneous tissue along ventral abdominal wall Scheduled today for image guided drain placement with moderate sedation The patient has been NPO, no blood thinners taken, labs and vitals have been reviewed. Risks and Benefits discussed with the patient including bleeding, infection, damage to adjacent structures, bowel perforation/fistula connection, and sepsis. All of the patient's questions were answered, patient is agreeable to proceed. Consent signed and in chart.   Thank you for this interesting consult.  I greatly enjoyed meeting [name] and look forward to participating in their care.  SignedHedy Jacob 05/04/2014, 1:15 PM   I spent a total of 20 Minutes  in face to face in clinical consultation, greater than 50% of which was counseling/coordinating care for subcutaneous abdominal fluid collection.

## 2014-05-08 LAB — BODY FLUID CULTURE: Special Requests: NORMAL

## 2014-05-11 ENCOUNTER — Encounter: Payer: Medicare Other | Attending: General Surgery | Admitting: Dietician

## 2014-05-11 DIAGNOSIS — F17200 Nicotine dependence, unspecified, uncomplicated: Secondary | ICD-10-CM | POA: Insufficient documentation

## 2014-05-11 DIAGNOSIS — Z713 Dietary counseling and surveillance: Secondary | ICD-10-CM | POA: Diagnosis not present

## 2014-05-11 NOTE — Progress Notes (Signed)
  Follow-up visit:  3 months Post-Operative Gastric Sleeve Surgery  Medical Nutrition Therapy:  Appt start time: 932 end time:  671  Primary concerns today: Post-operative Bariatric Surgery Nutrition Management. [name] returns today having lost another 14 pounds. She had an abscess that is currently having to be drained. Can no longer tolerate yogurt, protein shakes, or other dairy products. She has been eating bread and biscuits, had a sausage biscuit for breakfast. She verbalizes that she understands refined carbohydrates are not recommended. Had roast beef, 2 deviled eggs, and collards for dinner last night. However, she threw up peach cobbler and ice cream after dinner.   Surgery date: 02/13/2014 Surgery type: Sleeve Start weight at [facility]: 314 lbs on 06/30/2013 Weight today: 256.5 lbs Weight change: 14 lbs Total weight lost: 57.5 lbs  TANITA  BODY COMP RESULTS  01/29/14 02/27/14 04/11/14 05/11/14   BMI (kg/m^2) 48.7 44.9 41.1 39   Fat Mass (lbs) 171.5 157.0 138.5 122.5   Fat Free Mass (lbs) 148.5 138.0 132 134   Total Body Water (lbs) 108.5 101.0 96.5 98    Preferred Learning Style:   No preference indicated   Learning Readiness:   Contemplating  24-hr recall: Unable to accurately assess.  Fluid intake: 64 ounces Estimated total protein intake: unable to assess  Medications: see list Supplementation: not taking (does not like the taste)  Drinking while eating: no Hair loss: some Carbonated beverages: diet sodas N/V/D/C: regurgitation sometimes  Recent physical activity:  none  Progress Towards Goal(s):  Some progress.   Nutritional Diagnosis:  [place]-3.3 Overweight/obesity related to past poor dietary habits and physical inactivity as evidenced by patient w/ recent gastric sleeve surgery following dietary guidelines for continued weight loss.     Intervention:  Nutrition counseling provided. Reiterated nutritional recommendations for post op bariatric surgery. Emphasized  importance of small, high protein meals throughout the day. Encouraged eating slowly, tiny bites, and chewing thoroughly. Demonstrated appropriate serving sizes using food models. Brainstormed well-tolerated foods with the patient.    Samples provided and patient instructed on proper use:  Bariatric Advantage Calcium citrate chews (orange - qty 6) IWP#:[id number] Exp: 06/2014  Bariatric Advantage Calcium citrate chews (caramel - qty 6) Lot#: [id number] Exp: 06/2014  Bariatric Advantage Iron chewy bite (chocolate raspberry - qty 6) Lot#: [id number] Exp: 09/2014  Bariatric Advantage vitamin + calcium crystals ([place] Punch - qty 3) Lot#: [id number] Exp: 10/2015  Bariatric Advantage vitamin + calcium crystals (Citrus Splash - qty 3) Lot#: [id number] Exp: 09/2015   Teaching Method Utilized:  Visual Auditory Hands on  Barriers to learning/adherence to lifestyle change: food preferences and difficulty controlling portions  Demonstrated degree of understanding via:  Teach Back   Monitoring/Evaluation:  Dietary intake, exercise, and body weight. Follow up in 4 weeks for 4 month post-op visit.

## 2014-05-11 NOTE — Patient Instructions (Addendum)
-  Eat something with protein at least 3x a day  -Work on taking 3 calciums per day and 2 vitamins per day  -Chicken  -Beans -Ground beef (meat sauce, meat balls) -Low fat cheese (Laughing cow) -[place] sausage -Eggs    TANITA  BODY COMP RESULTS  01/29/14 02/27/14 04/11/14 05/11/14   BMI (kg/m^2) 48.7 44.9 41.1 39   Fat Mass (lbs) 171.5 157.0 138.5 122.5   Fat Free Mass (lbs) 148.5 138.0 132 134   Total Body Water (lbs) 108.5 101.0 96.5 98

## 2014-05-17 DIAGNOSIS — R300 Dysuria: Secondary | ICD-10-CM | POA: Diagnosis not present

## 2014-05-22 ENCOUNTER — Telehealth (HOSPITAL_COMMUNITY): Payer: Self-pay | Admitting: Interventional Radiology

## 2014-05-22 ENCOUNTER — Ambulatory Visit (HOSPITAL_COMMUNITY)
Admission: RE | Admit: 2014-05-22 | Discharge: 2014-05-22 | Disposition: A | Discharge status: Home / Self Care | Payer: Medicare Other | Source: Ambulatory Visit | Attending: General Surgery | Admitting: General Surgery

## 2014-05-22 ENCOUNTER — Other Ambulatory Visit (HOSPITAL_COMMUNITY): Payer: Self-pay | Admitting: Interventional Radiology

## 2014-05-22 DIAGNOSIS — Z9889 Other specified postprocedural states: Secondary | ICD-10-CM | POA: Diagnosis not present

## 2014-05-22 DIAGNOSIS — R1011 Right upper quadrant pain: Secondary | ICD-10-CM | POA: Diagnosis not present

## 2014-05-22 DIAGNOSIS — R188 Other ascites: Secondary | ICD-10-CM

## 2014-05-22 NOTE — Progress Notes (Signed)
Patient ID: [name], female   DOB: 1976-01-03, 39 y.o.   MRN: [id number] TELEPHONE FOLLOWUP:  Patient left from [place] prior to being seen and didn't want to return  No fevers, pain, or signs of infection at cath site per the patient  Output 400cc/ day light pink colored  Cavity collapsed by [place]  Plan continue suction bulb and followup 1 week

## 2014-05-22 NOTE — Telephone Encounter (Signed)
Called pt, left VM for her to call back to schedule her next drain clinic appt on 05/29/14 IR drain injection study JM

## 2014-05-23 DIAGNOSIS — F3160 Bipolar disorder, current episode mixed, unspecified: Secondary | ICD-10-CM | POA: Diagnosis not present

## 2014-05-29 ENCOUNTER — Ambulatory Visit (HOSPITAL_COMMUNITY)
Admission: RE | Admit: 2014-05-29 | Discharge: 2014-05-29 | Disposition: A | Discharge status: Home / Self Care | Payer: Medicare Other | Source: Ambulatory Visit | Attending: Interventional Radiology | Admitting: Interventional Radiology

## 2014-05-29 DIAGNOSIS — R188 Other ascites: Secondary | ICD-10-CM | POA: Insufficient documentation

## 2014-05-29 DIAGNOSIS — N732 Unspecified parametritis and pelvic cellulitis: Secondary | ICD-10-CM | POA: Diagnosis not present

## 2014-05-29 MED ORDER — IOHEXOL 300 MG/ML  SOLN
50.0000 mL | Freq: Once | INTRAMUSCULAR | Status: AC | PRN
  Administered 2014-05-29: 10 mL

## 2014-05-31 ENCOUNTER — Other Ambulatory Visit (HOSPITAL_COMMUNITY): Payer: Self-pay | Admitting: Interventional Radiology

## 2014-05-31 DIAGNOSIS — R188 Other ascites: Secondary | ICD-10-CM

## 2014-05-31 DIAGNOSIS — L0291 Cutaneous abscess, unspecified: Secondary | ICD-10-CM

## 2014-06-05 ENCOUNTER — Encounter (HOSPITAL_COMMUNITY): Payer: Self-pay

## 2014-06-05 ENCOUNTER — Ambulatory Visit (HOSPITAL_COMMUNITY)
Admission: RE | Admit: 2014-06-05 | Discharge: 2014-06-05 | Disposition: A | Discharge status: Home / Self Care | Payer: Medicare Other | Source: Ambulatory Visit | Attending: Interventional Radiology | Admitting: Interventional Radiology

## 2014-06-05 DIAGNOSIS — Z9889 Other specified postprocedural states: Secondary | ICD-10-CM | POA: Insufficient documentation

## 2014-06-05 DIAGNOSIS — Z9884 Bariatric surgery status: Secondary | ICD-10-CM | POA: Insufficient documentation

## 2014-06-05 DIAGNOSIS — K439 Ventral hernia without obstruction or gangrene: Secondary | ICD-10-CM | POA: Diagnosis not present

## 2014-06-05 DIAGNOSIS — R188 Other ascites: Secondary | ICD-10-CM | POA: Insufficient documentation

## 2014-06-05 DIAGNOSIS — T888XXA Other specified complications of surgical and medical care, not elsewhere classified, initial encounter: Secondary | ICD-10-CM | POA: Diagnosis not present

## 2014-06-05 MED ORDER — IOHEXOL 300 MG/ML  SOLN
80.0000 mL | Freq: Once | INTRAMUSCULAR | Status: AC | PRN
  Administered 2014-06-05: 80 mL via INTRAVENOUS

## 2014-06-05 NOTE — Progress Notes (Signed)
Received call from [place], Claiborne Billings, to place IV in pt, done.  Returned call to CT to let them know she was available for scan. Pt denied pain, fever associated w/ drain. C/o chronic pain to rt lower back area. Lt brown drainage to bag, pt stated she had not emptied since Saturday for Dr. Pascal Lux to see. Bag is full ~ 600cc. Pt stated she has not been flushing the drain.

## 2014-06-06 DIAGNOSIS — Z6841 Body Mass Index (BMI) 40.0 and over, adult: Secondary | ICD-10-CM | POA: Diagnosis not present

## 2014-06-06 DIAGNOSIS — K219 Gastro-esophageal reflux disease without esophagitis: Secondary | ICD-10-CM | POA: Diagnosis not present

## 2014-06-06 DIAGNOSIS — I10 Essential (primary) hypertension: Secondary | ICD-10-CM | POA: Diagnosis not present

## 2014-06-07 DIAGNOSIS — F319 Bipolar disorder, unspecified: Secondary | ICD-10-CM | POA: Diagnosis not present

## 2014-06-07 DIAGNOSIS — T148 Other injury of unspecified body region: Secondary | ICD-10-CM | POA: Diagnosis not present

## 2014-06-07 DIAGNOSIS — Z9884 Bariatric surgery status: Secondary | ICD-10-CM | POA: Diagnosis not present

## 2014-06-07 DIAGNOSIS — I10 Essential (primary) hypertension: Secondary | ICD-10-CM | POA: Diagnosis not present

## 2014-06-12 ENCOUNTER — Ambulatory Visit (HOSPITAL_COMMUNITY): Payer: Medicare Other

## 2014-06-12 DIAGNOSIS — E559 Vitamin D deficiency, unspecified: Secondary | ICD-10-CM | POA: Diagnosis not present

## 2014-06-12 DIAGNOSIS — E781 Pure hyperglyceridemia: Secondary | ICD-10-CM | POA: Diagnosis not present

## 2014-06-12 DIAGNOSIS — K909 Intestinal malabsorption, unspecified: Secondary | ICD-10-CM | POA: Diagnosis not present

## 2014-06-12 DIAGNOSIS — Z9884 Bariatric surgery status: Secondary | ICD-10-CM | POA: Diagnosis not present

## 2014-06-12 DIAGNOSIS — R110 Nausea: Secondary | ICD-10-CM | POA: Diagnosis not present

## 2014-06-12 DIAGNOSIS — K219 Gastro-esophageal reflux disease without esophagitis: Secondary | ICD-10-CM | POA: Diagnosis not present

## 2014-06-12 DIAGNOSIS — I10 Essential (primary) hypertension: Secondary | ICD-10-CM | POA: Diagnosis not present

## 2014-06-21 ENCOUNTER — Ambulatory Visit: Payer: Medicare Other | Admitting: Dietician

## 2014-06-25 DIAGNOSIS — F3160 Bipolar disorder, current episode mixed, unspecified: Secondary | ICD-10-CM | POA: Diagnosis not present

## 2014-06-28 DIAGNOSIS — E669 Obesity, unspecified: Secondary | ICD-10-CM | POA: Diagnosis not present

## 2014-06-28 DIAGNOSIS — I10 Essential (primary) hypertension: Secondary | ICD-10-CM | POA: Diagnosis not present

## 2014-06-28 DIAGNOSIS — Z9884 Bariatric surgery status: Secondary | ICD-10-CM | POA: Diagnosis not present

## 2014-06-28 DIAGNOSIS — T148 Other injury of unspecified body region: Secondary | ICD-10-CM | POA: Diagnosis not present

## 2014-06-28 DIAGNOSIS — F319 Bipolar disorder, unspecified: Secondary | ICD-10-CM | POA: Diagnosis not present

## 2014-06-28 DIAGNOSIS — D509 Iron deficiency anemia, unspecified: Secondary | ICD-10-CM | POA: Diagnosis not present

## 2014-07-11 DIAGNOSIS — F319 Bipolar disorder, unspecified: Secondary | ICD-10-CM | POA: Diagnosis not present

## 2014-07-24 ENCOUNTER — Other Ambulatory Visit (INDEPENDENT_AMBULATORY_CARE_PROVIDER_SITE_OTHER): Payer: Self-pay | Admitting: General Surgery

## 2014-07-24 DIAGNOSIS — R109 Unspecified abdominal pain: Secondary | ICD-10-CM

## 2014-07-24 DIAGNOSIS — R1012 Left upper quadrant pain: Secondary | ICD-10-CM

## 2014-07-24 DIAGNOSIS — S301XXA Contusion of abdominal wall, initial encounter: Secondary | ICD-10-CM

## 2014-07-26 ENCOUNTER — Other Ambulatory Visit: Payer: Self-pay

## 2014-07-26 ENCOUNTER — Other Ambulatory Visit (HOSPITAL_COMMUNITY): Payer: Self-pay | Admitting: Interventional Radiology

## 2014-07-26 DIAGNOSIS — R188 Other ascites: Secondary | ICD-10-CM

## 2014-07-26 DIAGNOSIS — T148 Other injury of unspecified body region: Secondary | ICD-10-CM | POA: Diagnosis not present

## 2014-07-26 DIAGNOSIS — Z9884 Bariatric surgery status: Secondary | ICD-10-CM | POA: Diagnosis not present

## 2014-07-26 DIAGNOSIS — F319 Bipolar disorder, unspecified: Secondary | ICD-10-CM | POA: Diagnosis not present

## 2014-07-26 DIAGNOSIS — R110 Nausea: Secondary | ICD-10-CM | POA: Diagnosis not present

## 2014-07-26 DIAGNOSIS — S301XXA Contusion of abdominal wall, initial encounter: Secondary | ICD-10-CM

## 2014-07-26 DIAGNOSIS — S301XXD Contusion of abdominal wall, subsequent encounter: Secondary | ICD-10-CM

## 2014-07-26 DIAGNOSIS — IMO0002 Reserved for concepts with insufficient information to code with codable children: Secondary | ICD-10-CM

## 2014-07-26 DIAGNOSIS — E669 Obesity, unspecified: Secondary | ICD-10-CM | POA: Diagnosis not present

## 2014-07-27 ENCOUNTER — Ambulatory Visit (HOSPITAL_COMMUNITY)
Admission: RE | Admit: 2014-07-27 | Discharge: 2014-07-27 | Disposition: A | Discharge status: Home / Self Care | Payer: Medicare Other | Source: Ambulatory Visit | Attending: Interventional Radiology | Admitting: Interventional Radiology

## 2014-07-27 ENCOUNTER — Ambulatory Visit (HOSPITAL_COMMUNITY)
Admission: RE | Admit: 2014-07-27 | Discharge: 2014-07-27 | Disposition: A | Discharge status: Home / Self Care | Payer: Medicare Other | Source: Ambulatory Visit | Attending: General Surgery | Admitting: General Surgery

## 2014-07-27 ENCOUNTER — Other Ambulatory Visit: Payer: Self-pay | Admitting: Physician Assistant

## 2014-07-27 ENCOUNTER — Other Ambulatory Visit: Payer: Medicare Other

## 2014-07-27 VITALS — BP 131/83 | HR 104 | Resp 18

## 2014-07-27 DIAGNOSIS — Z87891 Personal history of nicotine dependence: Secondary | ICD-10-CM | POA: Insufficient documentation

## 2014-07-27 DIAGNOSIS — Y832 Surgical operation with anastomosis, bypass or graft as the cause of abnormal reaction of the patient, or of later complication, without mention of misadventure at the time of the procedure: Secondary | ICD-10-CM | POA: Insufficient documentation

## 2014-07-27 DIAGNOSIS — T792XXA Traumatic secondary and recurrent hemorrhage and seroma, initial encounter: Secondary | ICD-10-CM | POA: Diagnosis not present

## 2014-07-27 DIAGNOSIS — J45909 Unspecified asthma, uncomplicated: Secondary | ICD-10-CM | POA: Insufficient documentation

## 2014-07-27 DIAGNOSIS — S301XXD Contusion of abdominal wall, subsequent encounter: Secondary | ICD-10-CM

## 2014-07-27 DIAGNOSIS — K219 Gastro-esophageal reflux disease without esophagitis: Secondary | ICD-10-CM | POA: Insufficient documentation

## 2014-07-27 DIAGNOSIS — T888XXA Other specified complications of surgical and medical care, not elsewhere classified, initial encounter: Secondary | ICD-10-CM | POA: Diagnosis not present

## 2014-07-27 DIAGNOSIS — L7622 Postprocedural hemorrhage and hematoma of skin and subcutaneous tissue following other procedure: Secondary | ICD-10-CM | POA: Diagnosis not present

## 2014-07-27 DIAGNOSIS — S301XXA Contusion of abdominal wall, initial encounter: Secondary | ICD-10-CM | POA: Insufficient documentation

## 2014-07-27 DIAGNOSIS — Z79899 Other long term (current) drug therapy: Secondary | ICD-10-CM | POA: Diagnosis not present

## 2014-07-27 LAB — BASIC METABOLIC PANEL
Anion gap: 13 (ref 5–15)
BUN: 5 mg/dL — ABNORMAL LOW (ref 6–20)
CO2: 25 mmol/L (ref 22–32)
Calcium: 9.7 mg/dL (ref 8.9–10.3)
Chloride: 101 mmol/L (ref 101–111)
Creatinine, Ser: 0.63 mg/dL (ref 0.44–1.00)
GFR calc Af Amer: >60 mL/min (ref >60)
GFR calc non Af Amer: >60 mL/min (ref >60)
Glucose, Bld: 91 mg/dL (ref 65–99)
Potassium: 3.3 mmol/L — ABNORMAL LOW (ref 3.5–5.1)
Sodium: 139 mmol/L (ref 135–145)

## 2014-07-27 LAB — APTT: aPTT: 33 seconds (ref 24–37)

## 2014-07-27 LAB — CBC WITH DIFFERENTIAL/PLATELET
Basophils Absolute: 0 10*3/uL (ref 0.0–0.1)
Basophils Relative: 0 % (ref 0–1)
Eosinophils Absolute: 0.1 10*3/uL (ref 0.0–0.7)
Eosinophils Relative: 1 % (ref 0–5)
HCT: 37 % (ref 36.0–46.0)
Hemoglobin: 12.2 g/dL (ref 12.0–15.0)
Lymphocytes Relative: 16 % (ref 12–46)
Lymphs Abs: 1.6 10*3/uL (ref 0.7–4.0)
MCH: 27.4 pg (ref 26.0–34.0)
MCHC: 33 g/dL (ref 30.0–36.0)
MCV: 83.1 fL (ref 78.0–100.0)
Monocytes Absolute: 0.5 10*3/uL (ref 0.1–1.0)
Monocytes Relative: 5 % (ref 3–12)
Neutro Abs: 7.6 10*3/uL (ref 1.7–7.7)
Neutrophils Relative %: 78 % — ABNORMAL HIGH (ref 43–77)
Platelets: 466 10*3/uL — ABNORMAL HIGH (ref 150–400)
RBC: 4.45 MIL/uL (ref 3.87–5.11)
RDW: 14.8 % (ref 11.5–15.5)
WBC: 9.7 10*3/uL (ref 4.0–10.5)

## 2014-07-27 LAB — PROTIME-INR
INR: 1.28 (ref 0.00–1.49)
Prothrombin Time: 16.1 seconds — ABNORMAL HIGH (ref 11.6–15.2)

## 2014-07-27 MED ORDER — CEFAZOLIN SODIUM-DEXTROSE 2-3 GM-% IV SOLR
INTRAVENOUS | Status: DC
Start: 2014-07-27 — End: 2014-07-28
  Filled 2014-07-27: qty 50

## 2014-07-27 MED ORDER — FENTANYL CITRATE (PF) 100 MCG/2ML IJ SOLN
INTRAMUSCULAR | Status: AC
  Filled 2014-07-27: qty 2

## 2014-07-27 MED ORDER — HYDROCODONE-ACETAMINOPHEN 5-325 MG PO TABS
1.0000 | ORAL_TABLET | Freq: Once | ORAL | Status: AC
  Administered 2014-07-27: 1 via ORAL
  Filled 2014-07-27: qty 1

## 2014-07-27 MED ORDER — SODIUM CHLORIDE 0.9 % IV SOLN
INTRAVENOUS | Status: DC
  Administered 2014-07-27: 10:00:00 via INTRAVENOUS

## 2014-07-27 MED ORDER — MIDAZOLAM HCL 2 MG/2ML IJ SOLN
INTRAMUSCULAR | Status: AC | PRN
  Administered 2014-07-27: 1 mg via INTRAVENOUS

## 2014-07-27 MED ORDER — FENTANYL CITRATE (PF) 100 MCG/2ML IJ SOLN
INTRAMUSCULAR | Status: AC | PRN
  Administered 2014-07-27: 50 ug via INTRAVENOUS

## 2014-07-27 MED ORDER — LIDOCAINE HCL 1 % IJ SOLN
INTRAMUSCULAR | Status: AC
  Filled 2014-07-27: qty 20

## 2014-07-27 MED ORDER — CEFAZOLIN SODIUM-DEXTROSE 2-3 GM-% IV SOLR
2.0000 g | Freq: Once | INTRAVENOUS | Status: AC
  Administered 2014-07-27: 2 g via INTRAVENOUS
  Filled 2014-07-27: qty 50

## 2014-07-27 MED ORDER — HYDROCODONE-ACETAMINOPHEN 5-325 MG PO TABS
ORAL_TABLET | ORAL | Status: AC
  Filled 2014-07-27: qty 1

## 2014-07-27 MED ORDER — MIDAZOLAM HCL 2 MG/2ML IJ SOLN
INTRAMUSCULAR | Status: AC
  Filled 2014-07-27: qty 2

## 2014-07-27 NOTE — Sedation Documentation (Signed)
Ancef 2G IV infusion started prior to Dr. Barbie Banner making his first incision.  - Soyla Dryer, RN

## 2014-07-27 NOTE — Procedures (Signed)
L abd seroma drain No comp

## 2014-07-27 NOTE — Sedation Documentation (Signed)
Pt awake, alert, no discomfort or distress noted. Pt rates her pain a 6/10 right now.  Will continue to monitor. Roselyn Reef Virgilio Broadhead,RN

## 2014-07-27 NOTE — H&P (Signed)
Chief Complaint: Anterior Abdominal Seroma  Referring Physician(s): Wilson,Eric  History of Present Illness: [name] is a 39 y.o. female with h/o Sleeve Gastric bypass in December of 2015.  She c/o a large seroma in her anterior abdominal wall.  She states it is very painful and she requires narcotic medication for pain control.  She is here today for  Image guided placement of a drain.  Past Medical History  Diagnosis Date  . GERD (gastroesophageal reflux disease)   . Diverticulitis 2008    perforated/ requiring resection  . Colostomy in place   . Bipolar affective   . Depression   . Anxiety   . Asthma   . Arthritis   . Blood transfusion without reported diagnosis   . Substance abuse     12 years ago-crack cocaine    Past Surgical History  Procedure Laterality Date  . Exploratory laparotomy with resection  2008  . Colostomy reversal  11/2006  . Abdominal wall hernia  07/2007    open repair with lysis of adhesions  . Hx of abd wall seroma  08/2007    Drained via [place] in [place], [place]  . Hx of abd wall seroma  10/2007    Drained by Dr. Geroge Baseman in office  . Tonsillectomy    . Kidney stones    . Hernia repair    . Examination under anesthesia N/A 05/11/2012    Procedure: EXAM UNDER ANESTHESIA;  Surgeon: Donato Heinz, MD;  Location: [facility];  Service: General;  Laterality: N/A;  . Placement of seton N/A 05/11/2012    Procedure: PLACEMENT OF SETON;  Surgeon: Donato Heinz, MD;  Location: [facility];  Service: General;  Laterality: N/A;  . Treatment fistula anal    . Finger closed reduction Right 08/05/2012    Procedure: CLOSED REDUCTION RIGHT THUMB (FINGER);  Surgeon: Linna Hoff, MD;  Location: [place];  Service: Orthopedics;  Laterality: Right;  . Esophagogastroduodenoscopy  03/13/2010    ASN:[id number] esophagus, status post passage of a Maloney dilator/Small hiatal hernia/ Antral erosions, status post biopsy  . Colonoscopy  03/13/2010    WIO:[id number]  hemorrhoids likely cause of hematochezia, otherwise normal  . Laparoscopic gastric sleeve resection N/A 02/13/2014    Procedure: LAPAROSCOPIC GASTRIC SLEEVE RESECTION LYSIS OF ADHESIONS, UPPER ENDOSCOPY;  Surgeon: [name], MD;  Location: [facility];  Service: General;  Laterality: N/A;    Allergies: Review of patient's allergies indicates no known allergies.  Medications: Prior to Admission medications   Medication Sig Start Date End Date Taking? Authorizing Provider  Armodafinil (NUVIGIL) 250 MG tablet Take 125 mg by mouth every morning.    Yes Historical Provider, MD  clonazePAM (KLONOPIN) 1 MG tablet Take 1 mg by mouth 2 (two) times daily.    Yes Historical Provider, MD  lisdexamfetamine (VYVANSE) 40 MG capsule Take 40 mg by mouth every morning.   Yes Historical Provider, MD  ondansetron (ZOFRAN) 4 MG tablet Take 4 mg by mouth every 8 (eight) hours as needed for nausea or vomiting.   Yes Historical Provider, MD  QUEtiapine (SEROQUEL XR) 400 MG 24 hr tablet Take 800 mg by mouth at bedtime.   Yes Historical Provider, MD  QUEtiapine (SEROQUEL) 400 MG tablet Take 800 mg by mouth at bedtime.   Yes Historical Provider, MD  valACYclovir (VALTREX) 500 MG tablet Take 1,000 mg by mouth daily as needed (fever blister).  03/01/13  Yes Historical Provider, MD  zolpidem (AMBIEN) 10 MG  tablet Take 10 mg by mouth at bedtime.    Yes Historical Provider, MD  promethazine (PHENERGAN) 12.5 MG tablet Take 1-2 tablets (12.5-25 mg total) by mouth every 8 (eight) hours as needed for nausea. Patient not taking: Reported on 07/27/2014 02/16/14   Alphonsa Overall, MD     Family History  Problem Relation Age of Onset  . Asthma Maternal Grandmother     History   Social History  . Marital Status: Divorced    Spouse Name: N/A  . Number of Children: 2  . Years of Education: N/A   Occupational History  . Dental Assistant    Social History Main Topics  . Smoking status: Former Smoker -- 0.50 packs/day for 14  years    Types: E-cigarettes    Quit date: 12/20/2013  . Smokeless tobacco: Current User  . Alcohol Use: No  . Drug Use: No  . Sexual Activity: Not on file   Other Topics Concern  . Not on file   Social History Narrative    Review of Systems  Constitutional: Negative for fever, activity change and appetite change.  HENT: Negative.   Respiratory: Negative for cough, chest tightness and shortness of breath.   Cardiovascular: Negative for chest pain.  Gastrointestinal: Positive for abdominal pain. Negative for nausea and vomiting.       Pain at area of Seroma  Genitourinary: Negative.   Musculoskeletal: Negative.   Skin: Negative.   Neurological: Negative.   Psychiatric/Behavioral: Negative.     Vital Signs: There were no vitals taken for this visit.  Physical Exam  Constitutional: She is oriented to person, place, and time. She appears well-developed and well-nourished.  HENT:  Head: Normocephalic and atraumatic.  Eyes: EOM are normal.  Neck: Normal range of motion. Neck supple.  Cardiovascular: Normal rate, regular rhythm and normal heart sounds.   Pulmonary/Chest: Effort normal and breath sounds normal.  Abdominal: Soft. Bowel sounds are normal.  Seroma on anterior abdomen, just left of midline. Soft, non-tender to palpation. No erythema or evidence of infection  Musculoskeletal: Normal range of motion.  Neurological: She is alert and oriented to person, place, and time.  Skin: Skin is warm and dry.  Psychiatric: She has a normal mood and affect. Her behavior is normal. Judgment and thought content normal.  Vitals reviewed.   Mallampati Score:  MD Evaluation Airway: WNL Heart: WNL Abdomen: WNL Chest/ Lungs: WNL ASA  Classification: 2 Mallampati/Airway Score: One  Imaging: No results found.  Labs:  CBC:  Recent Labs  02/14/14 0453 02/14/14 1624 02/15/14 0525 05/04/14 1145 07/27/14 0946  WBC 11.8*  --  10.2 7.1 9.7  HGB 12.2 11.2* 11.3* 8.7* 12.2   HCT 39.4 35.5* 35.7* 28.4* 37.0  PLT 317  --  245 586* 466*    COAGS:  Recent Labs  05/04/14 1145 07/27/14 0946  INR 1.26 1.28  APTT  --  33    BMP:  Recent Labs  09/01/13 0001 02/08/14 1010 02/14/14 0453 07/27/14 0946  NA 136 137 137 139  K 4.1 4.0 4.3 3.3*  CL 99 99 101 101  CO2 29 26 30 25   GLUCOSE 129* 77 130* 91  BUN 9 14 8  5*  CALCIUM 8.9 9.3 8.9 9.7  CREATININE 0.69 0.61 0.64 0.63  GFRNONAA  --  >90 >90 >60  GFRAA  --  >90 >90 >60    LIVER FUNCTION TESTS:  Recent Labs  09/01/13 0001 02/08/14 1010 02/14/14 0453  BILITOT 0.2 <0.2* 0.4  AST 11 18 29   ALT 19 23 30   ALKPHOS 80 89 82  PROT 6.2 7.1 7.2  ALBUMIN 4.0 4.2 4.2    TUMOR MARKERS: No results for input(s): AFPTM, CEA, CA199, CHROMGRNA in the last 8760 hours.  Assessment and Plan:  Painful Anterior Abdominal wall Seroma, s/p Sleeve Gastric Bypass in December  Will proceed with Image guided placement of Drain today by Dr. Barbie Banner  Thank you for this interesting consult.  I greatly enjoyed meeting [name] and look forward to participating in their care.  SignedNevin Bloodgood 07/27/2014, 11:22 AM   I spent a total of  20 Minutes   in face to face in clinical consultation, greater than 50% of which was counseling/coordinating care for image guided drain placement to treat seroma.

## 2014-07-27 NOTE — Discharge Instructions (Signed)
Bulb Drain Home Care A bulb drain consists of a thin rubber tube and a soft, round bulb that creates a gentle suction. The rubber tube is placed in the area where you had surgery. A bulb is attached to the end of the tube that is outside the body. The bulb drain removes excess fluid that normally builds up in a surgical wound after surgery. The color and amount of fluid will vary. Immediately after surgery, the fluid is bright red and is a little thicker than water. It may gradually change to a yellow or pink color and become more thin and water-like. When the amount decreases to about 1 or 2 tbsp in 24 hours, your health care provider will usually remove it. DAILY CARE  Keep the bulb flat (compressed) at all times, except while emptying it. The flatness creates suction. You can flatten the bulb by squeezing it firmly in the middle and then closing the cap. Keep sites where the tube enters the skin dry and covered w  Incision and Drainage Incision and drainage is a procedure in which a sac-like structure (cystic structure) is opened and drained. The area to be drained usually contains material such as pus, fluid, or blood.  LET YOUR CAREGIVER KNOW ABOUT:  Allergies to medicine. Medicines taken, including vitamins, herbs, eyedrops, over-the-counter medicines, and creams. Use of steroids (by mouth or creams). Previous problems with anesthetics or numbing medicines. History of bleeding problems or blood clots. Previous surgery. Other health problems, including diabetes and kidney problems. Possibility of pregnancy, if this applies. RISKS AND COMPLICATIONS Pain. Bleeding. Scarring. Infection. BEFORE THE PROCEDURE  You may need to have an ultrasound or other imaging tests to see how large or deep your cystic structure is. Blood tests may also be used to determine if you have an infection or how severe the infection is. You may need to have a tetanus shot. PROCEDURE  The affected area is cleaned  with a cleaning fluid. The cyst area will then be numbed with a medicine (local anesthetic). A small incision will be made in the cystic structure. A syringe or catheter may be used to drain the contents of the cystic structure, or the contents may be squeezed out. The area will then be flushed with a cleansing solution. After cleansing the area, it is often gently packed with a gauze or another wound dressing. Once it is packed, it will be covered with gauze and tape or some other type of wound dressing. AFTER THE PROCEDURE  Often, you will be allowed to go home right after the procedure. You may be given antibiotic medicine to prevent or heal an infection. If the area was packed with gauze or some other wound dressing, you will likely need to come back in 1 to 2 days to get it removed. The area should heal in about 14 days. Document Released: 08/05/2000 Document Revised: 08/11/2011 Document Reviewed: 04/06/2011 [facility] Patient Information 2015 [place], [place]. This information is not intended to replace advice given to you by your health care provider. Make sure you discuss any questions you have with your health care provider.  ith a bandage (dressing).  Secure the tube 1-2 in (2.5-5.1 cm) below the insertion sites to keep it from pulling on your stitches. The tube is stitched in place and will not slip out.  Secure the bulb as directed by your health care provider.  For the first 3 days after surgery, there usually is more fluid in the bulb. Empty the bulb  whenever it becomes half full because the bulb does not create enough suction if it is too full. The bulb could also overflow. Write down how much fluid you remove each time you empty your drain. Add up the amount removed in 24 hours.  Empty the bulb at the same time every day once the amount of fluid decreases and you only need to empty it once a day. Write down the amounts and the 24-hour totals to give to your health care provider. This  helps your health care provider know when the tubes can be removed. EMPTYING THE BULB DRAIN Before emptying the bulb, get a measuring cup, a piece of paper and a pen, and wash your hands.  Gently run your fingers down the tube (stripping) to empty any drainage from the tubing into the bulb. This may need to be done several times a day to clear the tubing of clots and tissue.  Open the bulb cap to release suction, which causes it to inflate. Do not touch the inside of the cap.  Gently run your fingers down the tube (stripping) to empty any drainage from the tubing into the bulb.  Hold the cap out of the way, and pour fluid into the measuring cup.   Squeeze the bulb to provide suction.  Replace the cap.   Check the tape that holds the tube to your skin. If it is becoming loose, you can remove the loose piece of tape and apply a new one. Then, pin the bulb to your shirt.   Write down the amount of fluid you emptied out. Write down the date and each time you emptied your bulb drain. (If there are 2 bulbs, note the amount of drainage from each bulb and keep the totals separate. Your health care provider will want to know the total amounts for each drain and which tube is draining more.)   Flush the fluid down the toilet and wash your hands.   Call your health care provider once you have less than 2 tbsp of fluid collecting in the bulb drain every 24 hours. If there is drainage around the tube site, change dressings and keep the area dry. Cleanse around tube with sterile saline and place dry gauze around site. This gauze should be changed when it is soiled. If it stays clean and unsoiled, it should still be changed daily.  SEEK MEDICAL CARE IF:  Your drainage has a bad smell or is cloudy.   You have a fever.   Your drainage is increasing instead of decreasing.   Your tube fell out.   You have redness or swelling around the tube site.   You have drainage from a surgical wound.    Your bulb drain will not stay flat after you empty it.  MAKE SURE YOU:   Understand these instructions.  Will watch your condition.  Will get help right away if you are not doing well or get worse. Document Released: 02/07/2000 Document Revised: 06/26/2013 Document Reviewed: 07/15/2011 [facility] Patient Information 2015 [place], [place]. This information is not intended to replace advice given to you by your health care provider. Make sure you discuss any questions you have with your health care provider.

## 2014-07-30 LAB — CULTURE, ROUTINE-ABSCESS

## 2014-08-15 ENCOUNTER — Other Ambulatory Visit: Payer: Self-pay | Admitting: General Surgery

## 2014-08-15 DIAGNOSIS — Z9884 Bariatric surgery status: Secondary | ICD-10-CM

## 2014-08-15 DIAGNOSIS — G8918 Other acute postprocedural pain: Secondary | ICD-10-CM

## 2014-08-15 DIAGNOSIS — E669 Obesity, unspecified: Secondary | ICD-10-CM | POA: Diagnosis not present

## 2014-08-15 DIAGNOSIS — F319 Bipolar disorder, unspecified: Secondary | ICD-10-CM | POA: Diagnosis not present

## 2014-08-15 DIAGNOSIS — K219 Gastro-esophageal reflux disease without esophagitis: Secondary | ICD-10-CM | POA: Diagnosis not present

## 2014-08-15 DIAGNOSIS — D509 Iron deficiency anemia, unspecified: Secondary | ICD-10-CM | POA: Diagnosis not present

## 2014-08-15 DIAGNOSIS — T148 Other injury of unspecified body region: Secondary | ICD-10-CM | POA: Diagnosis not present

## 2014-08-25 ENCOUNTER — Encounter (HOSPITAL_COMMUNITY): Payer: Self-pay | Admitting: Nurse Practitioner

## 2014-08-25 ENCOUNTER — Emergency Department (HOSPITAL_COMMUNITY)
Admission: EM | Admit: 2014-08-25 | Discharge: 2014-08-25 | Disposition: A | Discharge status: Home / Self Care | Payer: Medicare Other | Attending: Emergency Medicine | Admitting: Emergency Medicine

## 2014-08-25 DIAGNOSIS — Z87891 Personal history of nicotine dependence: Secondary | ICD-10-CM | POA: Insufficient documentation

## 2014-08-25 DIAGNOSIS — T85698A Other mechanical complication of other specified internal prosthetic devices, implants and grafts, initial encounter: Secondary | ICD-10-CM

## 2014-08-25 DIAGNOSIS — J45909 Unspecified asthma, uncomplicated: Secondary | ICD-10-CM | POA: Diagnosis not present

## 2014-08-25 DIAGNOSIS — S301XXD Contusion of abdominal wall, subsequent encounter: Secondary | ICD-10-CM | POA: Diagnosis not present

## 2014-08-25 DIAGNOSIS — F419 Anxiety disorder, unspecified: Secondary | ICD-10-CM | POA: Diagnosis not present

## 2014-08-25 DIAGNOSIS — Z933 Colostomy status: Secondary | ICD-10-CM | POA: Diagnosis not present

## 2014-08-25 DIAGNOSIS — Y838 Other surgical procedures as the cause of abnormal reaction of the patient, or of later complication, without mention of misadventure at the time of the procedure: Secondary | ICD-10-CM | POA: Insufficient documentation

## 2014-08-25 DIAGNOSIS — Z79899 Other long term (current) drug therapy: Secondary | ICD-10-CM | POA: Diagnosis not present

## 2014-08-25 DIAGNOSIS — R110 Nausea: Secondary | ICD-10-CM | POA: Diagnosis not present

## 2014-08-25 DIAGNOSIS — X58XXXD Exposure to other specified factors, subsequent encounter: Secondary | ICD-10-CM | POA: Insufficient documentation

## 2014-08-25 DIAGNOSIS — F319 Bipolar disorder, unspecified: Secondary | ICD-10-CM | POA: Insufficient documentation

## 2014-08-25 DIAGNOSIS — Z9889 Other specified postprocedural states: Secondary | ICD-10-CM | POA: Insufficient documentation

## 2014-08-25 DIAGNOSIS — M1990 Unspecified osteoarthritis, unspecified site: Secondary | ICD-10-CM | POA: Diagnosis not present

## 2014-08-25 DIAGNOSIS — K9419 Other complications of enterostomy: Secondary | ICD-10-CM | POA: Diagnosis not present

## 2014-08-25 DIAGNOSIS — T8189XA Other complications of procedures, not elsewhere classified, initial encounter: Secondary | ICD-10-CM | POA: Insufficient documentation

## 2014-08-25 DIAGNOSIS — K219 Gastro-esophageal reflux disease without esophagitis: Secondary | ICD-10-CM | POA: Insufficient documentation

## 2014-08-25 MED ORDER — HYDROCODONE-ACETAMINOPHEN 5-325 MG PO TABS: 1.0000 | ORAL_TABLET | Freq: Four times a day (QID) | ORAL | Status: DC | PRN

## 2014-08-25 MED ORDER — LIDOCAINE-EPINEPHRINE (PF) 2 %-1:200000 IJ SOLN
10.0000 mL | Freq: Once | INTRAMUSCULAR | Status: AC
  Administered 2014-08-25: 10 mL via INTRADERMAL
  Filled 2014-08-25: qty 20

## 2014-08-25 NOTE — ED Provider Notes (Signed)
CSN: [id number]     Arrival date & time 08/25/14  1314 History   First MD Initiated Contact with Patient 08/25/14 1528     Chief Complaint  Patient presents with  . Follow-up   (Consider location/radiation/quality/duration/timing/severity/associated sxs/prior Treatment) Patient is a 39 y.o. female presenting with abdominal pain. The history is provided by the patient and medical records.  Abdominal Pain Pain location:  Generalized Pain quality: aching   Pain radiates to:  Does not radiate Pain severity:  Moderate Onset quality:  Gradual Timing:  Constant Progression:  Waxing and waning Chronicity:  Chronic Context: previous surgery   Relieved by: narcotics. Worsened by:  Movement and palpation Ineffective treatments:  None tried Associated symptoms: nausea   Associated symptoms: no anorexia, no chest pain, no chills, no constipation, no cough, no diarrhea, no fatigue, no fever, no melena, no shortness of breath and no vomiting   Risk factors: multiple surgeries, obesity and recent hospitalization     Past Medical History  Diagnosis Date  . GERD (gastroesophageal reflux disease)   . Diverticulitis 2008    perforated/ requiring resection  . Colostomy in place   . Bipolar affective   . Depression   . Anxiety   . Asthma   . Arthritis   . Blood transfusion without reported diagnosis   . Substance abuse     12 years ago-crack cocaine   Past Surgical History  Procedure Laterality Date  . Exploratory laparotomy with resection  2008  . Colostomy reversal  11/2006  . Abdominal wall hernia  07/2007    open repair with lysis of adhesions  . Hx of abd wall seroma  08/2007    Drained via [place] in [place], [place]  . Hx of abd wall seroma  10/2007    Drained by Dr. Geroge Baseman in office  . Tonsillectomy    . Kidney stones    . Hernia repair    . Examination under anesthesia N/A 05/11/2012    Procedure: EXAM UNDER ANESTHESIA;  Surgeon: Donato Heinz, MD;  Location: [facility];  Service:  General;  Laterality: N/A;  . Placement of seton N/A 05/11/2012    Procedure: PLACEMENT OF SETON;  Surgeon: Donato Heinz, MD;  Location: [facility];  Service: General;  Laterality: N/A;  . Treatment fistula anal    . Finger closed reduction Right 08/05/2012    Procedure: CLOSED REDUCTION RIGHT THUMB (FINGER);  Surgeon: Linna Hoff, MD;  Location: [facility];  Service: Orthopedics;  Laterality: Right;  . Esophagogastroduodenoscopy  03/13/2010    YQM:VHQION-[id number] esophagus, status post passage of a Maloney dilator/Small hiatal hernia/ Antral erosions, status post biopsy  . Colonoscopy  03/13/2010    KGM:WNUUVOZD hemorrhoids likely cause of hematochezia, otherwise normal  . Laparoscopic gastric sleeve resection N/A 02/13/2014    Procedure: LAPAROSCOPIC GASTRIC SLEEVE RESECTION LYSIS OF ADHESIONS, UPPER ENDOSCOPY;  Surgeon: [name], MD;  Location: [facility];  Service: General;  Laterality: N/A;   Family History  Problem Relation Age of Onset  . Asthma Maternal Grandmother    History  Substance Use Topics  . Smoking status: Former Smoker -- 0.50 packs/day for 14 years    Types: E-cigarettes    Quit date: 12/20/2013  . Smokeless tobacco: Current User  . Alcohol Use: No   OB History    No data available     Review of Systems  Constitutional: Negative for fever, chills, diaphoresis and fatigue.  Respiratory: Negative for cough, chest tightness and shortness of breath.  Cardiovascular: Negative for chest pain.  Gastrointestinal: Positive for nausea and abdominal pain. Negative for vomiting, diarrhea, constipation, melena and anorexia.  Neurological: Negative for weakness, light-headedness and headaches.  Psychiatric/Behavioral: Negative for confusion.  All other systems reviewed and are negative.     Allergies  Review of patient's allergies indicates no known allergies.  Home Medications   Prior to Admission medications   Medication Sig Start Date End Date Taking? Authorizing  Provider  acetaminophen (TYLENOL) 500 MG tablet Take 1,000 mg by mouth 4 (four) times daily.   Yes Historical Provider, MD  Armodafinil (NUVIGIL) 250 MG tablet Take 125 mg by mouth daily.    Yes Historical Provider, MD  Calcium Carbonate-Vitamin D (CALCIUM + D PO) Take 1 tablet by mouth daily.   Yes Historical Provider, MD  clonazePAM (KLONOPIN) 1 MG tablet Take 1 mg by mouth 2 (two) times daily.    Yes Historical Provider, MD  esomeprazole (NEXIUM) 40 MG capsule Take 40 mg by mouth daily before lunch.   Yes Historical Provider, MD  ferrous sulfate 325 (65 FE) MG tablet Take 650 mg by mouth daily.   Yes Historical Provider, MD  lisdexamfetamine (VYVANSE) 40 MG capsule Take 40 mg by mouth daily.    Yes Historical Provider, MD  Multiple Vitamin (MULTIVITAMIN WITH MINERALS) TABS tablet Take 1 tablet by mouth 2 (two) times daily.   Yes Historical Provider, MD  Multiple Vitamins-Minerals (HAIR/SKIN/NAILS/BIOTIN) TABS Take 1 tablet by mouth 2 (two) times daily.   Yes Historical Provider, MD  ondansetron (ZOFRAN-ODT) 4 MG disintegrating tablet Take 4 mg by mouth every 6 (six) hours as needed for nausea or vomiting.  08/15/14  Yes Historical Provider, MD  promethazine (PHENERGAN) 12.5 MG tablet Take 1-2 tablets (12.5-25 mg total) by mouth every 8 (eight) hours as needed for nausea. Patient taking differently: Take 12.5-25 mg by mouth every 6 (six) hours as needed for nausea or vomiting.  02/16/14  Yes Alphonsa Overall, MD  QUEtiapine (SEROQUEL) 400 MG tablet Take 800 mg by mouth at bedtime.   Yes Historical Provider, MD  valACYclovir (VALTREX) 500 MG tablet Take 500 mg by mouth daily.  03/01/13  Yes Historical Provider, MD  zolpidem (AMBIEN) 10 MG tablet Take 10 mg by mouth at bedtime.    Yes Historical Provider, MD  HYDROcodone-acetaminophen (NORCO/VICODIN) 5-325 MG per tablet Take 1-2 tablets by mouth every 6 (six) hours as needed (pain). 08/25/14   Tori Milks, MD   BP 156/86 mmHg  Pulse 115  Temp(Src) 98.2  F (36.8 C) (Oral)  Resp 22  SpO2 98%  Filed Vitals:   08/25/14 1545 08/25/14 1600 08/25/14 1630 08/25/14 1820  BP: 154/100 148/104 160/94 149/98  Pulse: 99 80 55 90  Temp:    97.9 F (36.6 C)  TempSrc:    Oral  Resp:    16  SpO2: 100% 100%  100%   Physical Exam  Constitutional: She appears well-developed and well-nourished. No distress.  HENT:  Head: Normocephalic and atraumatic.  Nose: Nose normal.  Mouth/Throat: Oropharynx is clear and moist. No oropharyngeal exudate.  Eyes: EOM are normal. Pupils are equal, round, and reactive to light.  Neck: Normal range of motion. Neck supple.  Cardiovascular: Normal rate, regular rhythm, normal heart sounds and intact distal pulses.   No murmur heard. Pulmonary/Chest: Effort normal and breath sounds normal. No respiratory distress. She has no wheezes. She exhibits no tenderness.  Abdominal: Soft. There is tenderness. There is no rebound and no guarding.  Several  well healed abdominal surgical scars noted.   JP drain at midline mid abdomen, with sutures around the drain but no longer attached to the patient's skin.  No erythema around the insertion site, no warmth, no fluctuance noted.  Diffusely mildly tender to palpation of abdomen but no pinpoint tenderness  Musculoskeletal: Normal range of motion. She exhibits no tenderness.  Lymphadenopathy:    She has no cervical adenopathy.  Neurological: She is alert. No cranial nerve deficit. Coordination normal.  Skin: Skin is warm and dry. She is not diaphoretic.  Psychiatric: She has a normal mood and affect. Her behavior is normal. Judgment and thought content normal.  Nursing note and vitals reviewed.   ED Course  LACERATION REPAIR Date/Time: 08/25/2014 5:00 AM Performed by: Tori Milks Authorized by: Leonard Schwartz Consent: Verbal consent obtained. Risks and benefits: risks, benefits and alternatives were discussed Consent given by: patient Required items: required blood products,  implants, devices, and special equipment available Patient identity confirmed: verbally with patient Time out: Immediately prior to procedure a "time out" was called to verify the correct patient, procedure, equipment, support staff and site/side marked as required. Body area: trunk Location details: abdomen Wound length (cm): JP drain insertion site. Anesthesia: local infiltration Local anesthetic: lidocaine 2% with epinephrine Anesthetic total: 4 ml Patient sedated: no Preparation: Patient was prepped and draped in the usual sterile fashion. Debridement: none Wound skin closure material used: JP drain sutured to the patient's abdominal wall with 3-0 nylon. Number of sutures: 2 Technique: simple Dressing: 4x4 sterile gauze Patient tolerance: Patient tolerated the procedure well with no immediate complications   (including critical care time) Labs Review Labs Reviewed - No data to display  Imaging Review No results found.   EKG Interpretation None      MDM   Final diagnoses:  JP drain, broken, initial encounter  Abdominal wall seroma, subsequent encounter   Pt is a 39 yo F with hx of bipolar disorder, depression, and obesity who is s/p lap gastric sleeve in Dec (02/13/14) w/ [name] that was complicated by an abdominal wall seroma who presents today complaining of a problem with her JP drain.  She had a CT abd/pelvis in April that showed a persistent large fluid collection in her ventral abdominal wall so a drain was placed by IR.  Initially placed in March then replaced in June after the drain became dislodged.  She complains today that her stay suture has ripped through her skin so it is no longer holding her drain in place.  She is followed by [name] with [facility], and his clinic referred to her to the ED to get it evaluated.  Pt is well appearing, denies any infectious sx, and reports her drain output is unchanged from baseline.  Do not believe  she would benefit from emergent labs or imaging at this time.  [facility] Surgery team on call and spoke to Dr. Georgette Dover.  He voiced that he was comfortable with the ED team re-suturing the drain back in place and for patient to f/u in clinic next week.  Locally injected lidocaine then placed 2 x sutures around the drain to anchor it to her abdominal wall.  Pt tolerated it well.   She was informed of the plan and voiced understanding/agreement.  Reports that her narcotic Rx has run out so she was given an Rx for 12 tablets of 5 mg norco.  Informed that she will need to f/u with her PCP  or surgery clinic for long term pain control.  Advised on ED return precautions and all questions were answered prior to dc home in stable condition.    Patient was seen with ED Attending, Dr. Lucina Mellow, MD      Tori Milks, MD 08/26/14 0981  Leonard Schwartz, MD 09/09/14 [phone]

## 2014-08-25 NOTE — Discharge Instructions (Signed)
Bulb Drain Home Care A bulb drain consists of a thin rubber tube and a soft, round bulb that creates a gentle suction. The rubber tube is placed in the area where you had surgery. A bulb is attached to the end of the tube that is outside the body. The bulb drain removes excess fluid that normally builds up in a surgical wound after surgery. The color and amount of fluid will vary. Immediately after surgery, the fluid is bright red and is a little thicker than water. It may gradually change to a yellow or pink color and become more thin and water-like. When the amount decreases to about 1 or 2 tbsp in 24 hours, your health care provider will usually remove it. DAILY CARE  Keep the bulb flat (compressed) at all times, except while emptying it. The flatness creates suction. You can flatten the bulb by squeezing it firmly in the middle and then closing the cap.  Keep sites where the tube enters the skin dry and covered with a bandage (dressing).  Secure the tube 1-2 in (2.5-5.1 cm) below the insertion sites to keep it from pulling on your stitches. The tube is stitched in place and will not slip out.  Secure the bulb as directed by your health care provider.  For the first 3 days after surgery, there usually is more fluid in the bulb. Empty the bulb whenever it becomes half full because the bulb does not create enough suction if it is too full. The bulb could also overflow. Write down how much fluid you remove each time you empty your drain. Add up the amount removed in 24 hours.  Empty the bulb at the same time every day once the amount of fluid decreases and you only need to empty it once a day. Write down the amounts and the 24-hour totals to give to your health care provider. This helps your health care provider know when the tubes can be removed. EMPTYING THE BULB DRAIN Before emptying the bulb, get a measuring cup, a piece of paper and a pen, and wash your hands.  Gently run your fingers down the  tube (stripping) to empty any drainage from the tubing into the bulb. This may need to be done several times a day to clear the tubing of clots and tissue.  Open the bulb cap to release suction, which causes it to inflate. Do not touch the inside of the cap.  Gently run your fingers down the tube (stripping) to empty any drainage from the tubing into the bulb.  Hold the cap out of the way, and pour fluid into the measuring cup.   Squeeze the bulb to provide suction.  Replace the cap.   Check the tape that holds the tube to your skin. If it is becoming loose, you can remove the loose piece of tape and apply a new one. Then, pin the bulb to your shirt.   Write down the amount of fluid you emptied out. Write down the date and each time you emptied your bulb drain. (If there are 2 bulbs, note the amount of drainage from each bulb and keep the totals separate. Your health care provider will want to know the total amounts for each drain and which tube is draining more.)   Flush the fluid down the toilet and wash your hands.   Call your health care provider once you have less than 2 tbsp of fluid collecting in the bulb drain every 24 hours. If  there is drainage around the tube site, change dressings and keep the area dry. Cleanse around tube with sterile saline and place dry gauze around site. This gauze should be changed when it is soiled. If it stays clean and unsoiled, it should still be changed daily.  SEEK MEDICAL CARE IF:  Your drainage has a bad smell or is cloudy.   You have a fever.   Your drainage is increasing instead of decreasing.   Your tube fell out.   You have redness or swelling around the tube site.   You have drainage from a surgical wound.   Your bulb drain will not stay flat after you empty it.  MAKE SURE YOU:   Understand these instructions.  Will watch your condition.  Will get help right away if you are not doing well or get worse. Document  Released: 02/07/2000 Document Revised: 06/26/2013 Document Reviewed: 07/15/2011 [facility] Patient Information 2015 [place], [place]. This information is not intended to replace advice given to you by your health care provider. Make sure you discuss any questions you have with your health care provider.

## 2014-08-25 NOTE — ED Notes (Signed)
She has a surgical drain in abdomen for an infection. She reports last night the stitch holding the drain came out and now she is concerned the drain may not be in the right place. She called [facility] and they referred her to ED

## 2014-08-29 ENCOUNTER — Ambulatory Visit
Admission: RE | Admit: 2014-08-29 | Discharge: 2014-08-29 | Disposition: A | Discharge status: Home / Self Care | Payer: Medicare Other | Source: Ambulatory Visit | Attending: General Surgery | Admitting: General Surgery

## 2014-08-29 DIAGNOSIS — R109 Unspecified abdominal pain: Secondary | ICD-10-CM | POA: Diagnosis not present

## 2014-08-29 DIAGNOSIS — Z4682 Encounter for fitting and adjustment of non-vascular catheter: Secondary | ICD-10-CM | POA: Diagnosis not present

## 2014-08-29 DIAGNOSIS — Z9884 Bariatric surgery status: Secondary | ICD-10-CM | POA: Diagnosis not present

## 2014-09-05 DIAGNOSIS — I10 Essential (primary) hypertension: Secondary | ICD-10-CM | POA: Diagnosis not present

## 2014-09-05 DIAGNOSIS — F319 Bipolar disorder, unspecified: Secondary | ICD-10-CM | POA: Diagnosis not present

## 2014-09-05 DIAGNOSIS — T148 Other injury of unspecified body region: Secondary | ICD-10-CM | POA: Diagnosis not present

## 2014-09-05 DIAGNOSIS — D509 Iron deficiency anemia, unspecified: Secondary | ICD-10-CM | POA: Diagnosis not present

## 2014-09-05 DIAGNOSIS — K219 Gastro-esophageal reflux disease without esophagitis: Secondary | ICD-10-CM | POA: Diagnosis not present

## 2014-09-05 DIAGNOSIS — E669 Obesity, unspecified: Secondary | ICD-10-CM | POA: Diagnosis not present

## 2014-09-05 DIAGNOSIS — Z9884 Bariatric surgery status: Secondary | ICD-10-CM | POA: Diagnosis not present

## 2014-09-06 ENCOUNTER — Encounter: Payer: Self-pay | Admitting: Dietician

## 2014-09-06 ENCOUNTER — Encounter: Payer: Medicare Other | Attending: Internal Medicine | Admitting: Dietician

## 2014-09-06 VITALS — Wt 214.5 lb

## 2014-09-06 DIAGNOSIS — Z48815 Encounter for surgical aftercare following surgery on the digestive system: Secondary | ICD-10-CM | POA: Diagnosis not present

## 2014-09-06 DIAGNOSIS — D509 Iron deficiency anemia, unspecified: Secondary | ICD-10-CM | POA: Diagnosis not present

## 2014-09-06 DIAGNOSIS — Z713 Dietary counseling and surveillance: Secondary | ICD-10-CM | POA: Insufficient documentation

## 2014-09-06 DIAGNOSIS — Z9884 Bariatric surgery status: Secondary | ICD-10-CM | POA: Diagnosis not present

## 2014-09-06 DIAGNOSIS — E669 Obesity, unspecified: Secondary | ICD-10-CM | POA: Insufficient documentation

## 2014-09-06 DIAGNOSIS — Z6832 Body mass index (BMI) 32.0-32.9, adult: Secondary | ICD-10-CM | POA: Diagnosis not present

## 2014-09-06 DIAGNOSIS — K219 Gastro-esophageal reflux disease without esophagitis: Secondary | ICD-10-CM | POA: Diagnosis not present

## 2014-09-06 DIAGNOSIS — I10 Essential (primary) hypertension: Secondary | ICD-10-CM | POA: Insufficient documentation

## 2014-09-06 NOTE — Patient Instructions (Addendum)
-  Eat something with protein at least 3x a day -Work on taking 3 calciums per day and 2 vitamins per day  -Chicken  -Beans -Ground beef (meat sauce, meat balls) -Low fat cheese (Laughing cow) -[place] sausage -Eggs  TANITA  BODY COMP RESULTS  01/29/14 02/27/14 04/11/14 05/11/14 09/06/14   BMI (kg/m^2) 48.7 44.9 41.1 39 32.6   Fat Mass (lbs) 171.5 157.0 138.5 122.5 87.5   Fat Free Mass (lbs) 148.5 138.0 132 134 127   Total Body Water (lbs) 108.5 101.0 96.5 98 93

## 2014-09-06 NOTE — Progress Notes (Signed)
  Follow-up visit:  7 months Post-Operative Gastric Sleeve Surgery  Medical Nutrition Therapy:  Appt start time: 2409 end time:  1205  Primary concerns today: Post-operative Bariatric Surgery Nutrition Management. [name] returns today having lost a total of 100 pounds since surgery. She has an abdominal seroma that causes discomfort; plans to have this surgically repaired. She states that her surgeon referred her to nutrition to maximize her nutrition status prior to undergoing surgery. [name] reports eating a lot of watermelon and popsicles. However, based on her dietary recall, her protein intake has improved. Craves ice all day, likely due to iron deficiency. Still cannot tolerate milk products. Recently got crowns on her teeth and is able to chew meat better. She reports that she is taking a complete multivitamin from [facility] 2x a day. Also taking vitamin D + Calcium 1x a day (Caltrate for women) and an iron supplement.    Surgery date: 02/13/2014 Surgery type: Sleeve Start weight at [facility]: 314 lbs on 06/30/2013 Weight today: 214.5 lbs Weight change: 42 lbs Total weight lost: 100.5 lbs  TANITA  BODY COMP RESULTS  01/29/14 02/27/14 04/11/14 05/11/14 09/06/14   BMI (kg/m^2) 48.7 44.9 41.1 39 32.6   Fat Mass (lbs) 171.5 157.0 138.5 122.5 87.5   Fat Free Mass (lbs) 148.5 138.0 132 134 127   Total Body Water (lbs) 108.5 101.0 96.5 98 93    Preferred Learning Style:   No preference indicated   Learning Readiness:   Ready  24-hr recall: B: scrambled egg and a piece of [place] sausage L: stewed squash and corn, roast beef, beans D: 3 oz steak or chicken and salad  Fluid intake: 64 ounces Estimated total protein intake: difficult to assess  Medications: see list Supplementation: taking  Drinking while eating: no Hair loss: yes, severe Carbonated beverages: diet sodas  Recent physical activity:  none  Progress Towards Goal(s):  Some progress.   Nutritional Diagnosis:  [place]-3.3  Overweight/obesity related to past poor dietary habits and physical inactivity as evidenced by patient w/ recent gastric sleeve surgery following dietary guidelines for continued weight loss.  Intervention:  Nutrition counseling provided. Encouraged the patient to continue eating protein foods at least 3x a day. Provided samples of chicken soup flavored protein powder to help her supplement protein intake. Also encouraged regular, sufficient vitamin/mineral supplement intake. Reviewed vitamin regimen.    Samples provided and patient instructed on proper use:  Unjury protein powder (chicken soup - qty 5) Lot#: [id number] Exp: 08/2015  Celebrate Calcium citrate chewable (cherry - qty 5) Lot#: [id number] Exp: 11/2014  Celebrate Calcium citrate chewable (orange - qty 5) Lot#: [id number] Exp: 02/2015   Bariatric Advantage vitamin + calcium crystals ([place] Punch - qty 5) Lot#: [id number] Exp: 10/2015   Teaching Method Utilized:  Visual Auditory Hands on  Barriers to learning/adherence to lifestyle change: food preferences and difficulty controlling portions  Demonstrated degree of understanding via:  Teach Back   Monitoring/Evaluation:  Dietary intake, exercise, and body weight. Follow up in 6 weeks for 8.5 month post-op visit.

## 2014-09-17 NOTE — Progress Notes (Signed)
Please put orders in Epic surgery 09-28-14 pre op 09-24-14 Thanks

## 2014-09-18 ENCOUNTER — Ambulatory Visit: Payer: Self-pay | Admitting: General Surgery

## 2014-09-18 NOTE — H&P (Signed)
[name] 09/05/2014 2:45 PM Location: [facility] Patient #: [id number] DOB: 05-Jul-1975 Divorced / Language: [name] / Race: White Female  History of Present Illness [name] Zaiah Eckerson MD; 09/13/2014 10:50 AM) Patient words: bariatric.  The patient is a 39 year old female presenting status-post bariatric surgery. She comes in for long-term follow-up after undergoing laparoscopic sleeve gastrectomy on December 22. Her preoperative weight was 320 pounds. I last saw her on June 22. She underwent replacement of her percutaneous drain into her abdominal wall seroma. She ended up in the emergency room because it was leaking somewhat. She states that she has been having more drainage from around the tube site. She denies any fevers or chills. She states that her abdominal pain is overall better. She reports that she is getting around a little bit easier. She denies any food intolerance. She denies any regurgitation or vomiting. She does have some mild heartburn. She had a follow-up ultrasound on July 6 which showed a residual fluid collection measuring 20 cm x 3.2 cm within the subcu anterior abdominal wall. It had significantly decreased since the prior image. ROS - 12 point review of systems was performed and is negative except for what is mentioned in the HPI as well as on the postoperative exam sheet in the paper chart   Problem List/Past Medical [name], MD; 09/13/2014 10:53 AM) IRON DEFICIENCY ANEMIA, UNSPECIFIED IRON DEFICIENCY ANEMIA TYPE (280.9  D50.9) OBESITY (BMI 30-39.9) (278.00  E66.9) Depression Anxiety Disorder NAUSEA (787.02  R11.0) HIGH TRIGLYCERIDES (272.1  E78.1) GASTROESOPHAGEAL REFLUX DISEASE WITHOUT ESOPHAGITIS (530.81  K21.9) BIPOLAR AFFECTIVE DISORDER, REMISSION STATUS UNSPECIFIED (296.80  F31.9) ELEVATED BLOOD PRESSURE (401.9  I10) THRUSH (112.0  B37.0) SEROMA (998.13  T14.8) S/P LAPAROSCOPIC SLEEVE GASTRECTOMY (V45.86   Z98.84)02/13/2014 IV weight 316lbs; preop wt 320 lbs  Other Problems [name], MD; 0/93/2671 24:58 AM) Umbilical Hernia Repair DYSURIA (788.1  R30.0) Back Pain Diverticulosis Kidney Stone Arthritis  Past Surgical History [name], MD; 09/13/2014 10:53 AM) Tonsillectomy Cesarean Section - Multiple Anal Fissure Repair Oral Surgery Colon Removal - Partial2008 perf diverticulitis, Hartman's procedure; then colostomy reversal in 0998 Ventral / Umbilical Hernia [id number] Left. open incisional hernia repair (no mesh, complicated by postop seroma)  Diagnostic Studies History [name], MD; 09/13/2014 10:53 AM) Pap Smear 1-5 years ago Mammogram never Colonoscopy 1-5 years ago  Allergies Marjean Donna, [place]; 09/05/2014 3:01 PM) No Known Drug Allergies12/04/2013  Medication History (Sonya Bynum, CMA; 09/05/2014 3:01 PM) Abdominal Binder/Elastic 2XL (1 (one) daily, Taken starting 06/07/2014) Active. Zofran ODT (4MG  Tablet Disperse, 1 (one) Tablet Disperse Oral every six hours, as needed, Taken starting 08/15/2014) Active. (Generic substitution allowed) Hydrocodone-Acetaminophen (5-325MG  Tablet, 1-2 Oral every six hours, as needed, Taken starting 08/15/2014) Active. Promethazine HCl (12.5MG  Tablet, 1 (one) Tablet Oral every six hours, as needed, Taken starting 08/17/2014) Active. Norco (5-325MG  Tablet, 1-2 Tablet Oral every six hours, as needed, Taken starting 08/29/2014) Active. Ambien (10MG  Tablet, Oral as needed) Active. NexIUM (20MG  Capsule DR, Oral) Active. Vyvanse (40MG  Capsule, Oral) Active. Valtrex (500MG  Tablet, Oral) Active. SEROquel (400MG  Tablet, Oral) Active. KlonoPIN (1MG  Tablet, Oral) Active. Nuvigil (250MG  Tablet, Oral) Active. Medications Reconciled  Social History [name], MD; 09/13/2014 10:53 AM) Tobacco use Former smoker. Alcohol use Remotely quit alcohol use. Caffeine use Tea. No drug use  Family History  [name], MD; 09/13/2014 10:53 AM) Melanoma Daughter. Thyroid problems Mother. Hypertension Mother. Arthritis Mother.  Pregnancy / Birth History [name],  MD; 09/13/2014 10:53 AM) [name] Age 2 Para 2 Age at menarche 57 years. Irregular periods Contraceptive History Intrauterine device. Maternal age 42-20  Vitals [name] CMA; 09/05/2014 3:01 PM) 09/05/2014 3:00 PM Weight: 215 lb Height: 68in Body Surface Area: 2.16 m Body Mass Index: 32.69 kg/m Temp.: 44F(Temporal)  Pulse: 77 (Regular)  BP: 140/80 (Sitting, Left Arm, Standard)    Physical Exam [name] Adhrit Krenz MD; 09/13/2014 10:46 AM) General Mental Status-Alert. General Appearance-Consistent with stated age. Hydration-Well hydrated. Voice-Normal.  Head and Neck Head-normocephalic, atraumatic with no lesions or palpable masses. Trachea-midline. Thyroid Gland Characteristics - normal size and consistency.  Eye Eyeball - Bilateral-Extraocular movements intact. Sclera/Conjunctiva - Bilateral-No scleral icterus.  Chest and Lung Exam Chest and lung exam reveals -quiet, even and easy respiratory effort with no use of accessory muscles and on auscultation, normal breath sounds, no adventitious sounds and normal vocal resonance. Inspection Chest Wall - Normal. Back - normal.  Cardiovascular Cardiovascular examination reveals -normal heart sounds, regular rate and rhythm with no murmurs.  Abdomen Inspection  Inspection of the abdomen reveals: Note: drain in mid abdomen. serous fluid; no cellulitis; skin irritation from tape. Skin - Scar - Note: Incision: clean, dry, and intact; well healed trocar sites; old midline incision. Palpation/Percussion Palpation and Percussion of the abdomen reveal - Soft, No Rebound tenderness, No Rigidity (guarding) and No hepatosplenomegaly. Note: obese abdomen; old scars. has central fullness/?lump - ? recurrence of ventral  seroma. Auscultation Auscultation of the abdomen reveals - Bowel sounds normal.    Assessment & Plan [name] Janina Trafton MD; 09/13/2014 10:53 AM) S/P LAPAROSCOPIC SLEEVE GASTRECTOMY (V45.86  Z98.84) Story: IV weight 316lbs; preop wt 320 lbs Impression: The majority of the appointment was spent counseling & reassuring the patient. We rediscussed proper eating techniques and behaviors. It is unclear why she has this persistent seroma. She did have this seroma after her open ventral hernia repair back in 2009 at outside hospital. This could just be a persistent seroma cavity. She is now having these have a fair amount of skin irritation from tape. She requested the drain be removed. We removed the drain. At this point since she has an ongoing seroma despite percutaneous drainage of think we just need to get her the operating room and open up the area and wash it out. Also recommended doing a diagnostic laparoscopy at the time as well. She may have a small ventral incisional hernia around her umbilicus. I told her that if she does have a hernia we would not repair it at the time of her abdominal wall washout since it would be very risky to leave mesh. There is no indication of any bowel contents within the hernia. If needed we may have to primarily repair it; however, my goal is to avoid having to deal with the hernia at the time of her trip back to the operating room. We discussed the risk and benefits of surgery including but not limited to bleeding, infection, recurrence, potential need for temporary drain placement, blood clot formation, injury to surrounding structures,. She will be scheduled for diagnostic laparoscopy, incision and washout and debridement of abdominal wall seroma Current Plans  Pt Education - EMW_bariatricFU SEROMA (998.13  T14.8) Impression: will get repeat u/s to evaluate for resolution of seroma. gave rx for pain/nausea. keep future as scheduling. Current Plans Changed Norco  5-325MG , 1 (one) Tablet every six hours, as needed, #40, 09/05/2014, No Refill. Schedule for Surgery Pt Education - [facility] Free Text Education/Instructions: discussed with patient and  provided information. ELEVATED BLOOD PRESSURE (401.9  I10) IRON DEFICIENCY ANEMIA, UNSPECIFIED IRON DEFICIENCY ANEMIA TYPE (280.9  D50.9) Impression: She was encouraged to start taking iron sulfate 325 mg twice a day. She appears to have gotten supplement over-the-counter. She is also taken some biotin for her hair loss. I reminded her that iron will tend to make her more constipated. I encouraged her to take MiraLAX daily. We also discussed the importance of drinking 64 ounces of water a day. GASTROESOPHAGEAL REFLUX DISEASE WITHOUT ESOPHAGITIS (530.81  K21.9) OBESITY (BMI 30-39.9) (278.00  E66.9) Impression: I congratulated her on her weight loss. She has lost about 90 pounds since surgery. We discussed the importance of making good food choices. I still think she is struggling with this. We rediscussed proper eating technique BIPOLAR AFFECTIVE DISORDER, REMISSION STATUS UNSPECIFIED (296.80  F31.9)   Leighton Ruff. Redmond Pulling, MD, FACS General, Bariatric, & Minimally Invasive Surgery [facility], [place]

## 2014-09-24 ENCOUNTER — Encounter (HOSPITAL_COMMUNITY)
Admission: RE | Admit: 2014-09-24 | Discharge: 2014-09-24 | Disposition: A | Discharge status: Home / Self Care | Payer: Medicare Other | Source: Ambulatory Visit | Attending: General Surgery | Admitting: General Surgery

## 2014-09-24 ENCOUNTER — Encounter (HOSPITAL_COMMUNITY): Payer: Self-pay

## 2014-09-24 DIAGNOSIS — Z1389 Encounter for screening for other disorder: Secondary | ICD-10-CM | POA: Diagnosis not present

## 2014-09-24 DIAGNOSIS — D649 Anemia, unspecified: Secondary | ICD-10-CM | POA: Diagnosis not present

## 2014-09-24 DIAGNOSIS — R110 Nausea: Secondary | ICD-10-CM | POA: Diagnosis not present

## 2014-09-24 DIAGNOSIS — K219 Gastro-esophageal reflux disease without esophagitis: Secondary | ICD-10-CM | POA: Insufficient documentation

## 2014-09-24 DIAGNOSIS — E781 Pure hyperglyceridemia: Secondary | ICD-10-CM | POA: Insufficient documentation

## 2014-09-24 DIAGNOSIS — Z01812 Encounter for preprocedural laboratory examination: Secondary | ICD-10-CM | POA: Diagnosis not present

## 2014-09-24 DIAGNOSIS — Z6835 Body mass index (BMI) 35.0-35.9, adult: Secondary | ICD-10-CM | POA: Diagnosis not present

## 2014-09-24 DIAGNOSIS — I10 Essential (primary) hypertension: Secondary | ICD-10-CM | POA: Insufficient documentation

## 2014-09-24 DIAGNOSIS — X58XXXD Exposure to other specified factors, subsequent encounter: Secondary | ICD-10-CM | POA: Diagnosis not present

## 2014-09-24 DIAGNOSIS — T148 Other injury of unspecified body region: Secondary | ICD-10-CM | POA: Insufficient documentation

## 2014-09-24 DIAGNOSIS — E6609 Other obesity due to excess calories: Secondary | ICD-10-CM | POA: Diagnosis not present

## 2014-09-24 DIAGNOSIS — E538 Deficiency of other specified B group vitamins: Secondary | ICD-10-CM | POA: Diagnosis not present

## 2014-09-24 DIAGNOSIS — E669 Obesity, unspecified: Secondary | ICD-10-CM | POA: Diagnosis not present

## 2014-09-24 HISTORY — DX: Sleep apnea, unspecified: G47.30

## 2014-09-24 HISTORY — DX: Anemia, unspecified: D64.9

## 2014-09-24 HISTORY — DX: Family history of other specified conditions: Z84.89

## 2014-09-24 HISTORY — DX: Headache: R51

## 2014-09-24 HISTORY — DX: Reserved for inherently not codable concepts without codable children: IMO0001

## 2014-09-24 HISTORY — DX: Headache, unspecified: R51.9

## 2014-09-24 LAB — CBC WITH DIFFERENTIAL/PLATELET
Basophils Absolute: 0 10*3/uL (ref 0.0–0.1)
Basophils Relative: 0 % (ref 0–1)
Eosinophils Absolute: 0 10*3/uL (ref 0.0–0.7)
Eosinophils Relative: 0 % (ref 0–5)
HCT: 37.4 % (ref 36.0–46.0)
Hemoglobin: 11.7 g/dL — ABNORMAL LOW (ref 12.0–15.0)
Lymphocytes Relative: 22 % (ref 12–46)
Lymphs Abs: 2.1 10*3/uL (ref 0.7–4.0)
MCH: 28.1 pg (ref 26.0–34.0)
MCHC: 31.3 g/dL (ref 30.0–36.0)
MCV: 89.7 fL (ref 78.0–100.0)
Monocytes Absolute: 0.8 10*3/uL (ref 0.1–1.0)
Monocytes Relative: 9 % (ref 3–12)
Neutro Abs: 6.4 10*3/uL (ref 1.7–7.7)
Neutrophils Relative %: 69 % (ref 43–77)
Platelets: 414 10*3/uL — ABNORMAL HIGH (ref 150–400)
RBC: 4.17 MIL/uL (ref 3.87–5.11)
RDW: 15.6 % — ABNORMAL HIGH (ref 11.5–15.5)
WBC: 9.3 10*3/uL (ref 4.0–10.5)

## 2014-09-24 LAB — COMPREHENSIVE METABOLIC PANEL
ALT: 14 U/L (ref 14–54)
AST: 12 U/L — ABNORMAL LOW (ref 15–41)
Albumin: 3.8 g/dL (ref 3.5–5.0)
Alkaline Phosphatase: 73 U/L (ref 38–126)
Anion gap: 7 (ref 5–15)
BUN: 10 mg/dL (ref 6–20)
CO2: 27 mmol/L (ref 22–32)
Calcium: 8.6 mg/dL — ABNORMAL LOW (ref 8.9–10.3)
Chloride: 103 mmol/L (ref 101–111)
Creatinine, Ser: 0.54 mg/dL (ref 0.44–1.00)
GFR calc Af Amer: >60 mL/min (ref >60)
GFR calc non Af Amer: >60 mL/min (ref >60)
Glucose, Bld: 85 mg/dL (ref 65–99)
Potassium: 3.9 mmol/L (ref 3.5–5.1)
Sodium: 137 mmol/L (ref 135–145)
Total Bilirubin: 0.4 mg/dL (ref 0.3–1.2)
Total Protein: 7.1 g/dL (ref 6.5–8.1)

## 2014-09-24 LAB — HCG, SERUM, QUALITATIVE: Preg, Serum: NEGATIVE

## 2014-09-24 NOTE — Patient Instructions (Addendum)
[name]  09/24/2014   Your procedure is scheduled on:  September 28, 2014  Report to [facility]  Entrance take [place]  elevators to 3rd floor to  [place] at  7:40 AM.  Call this number if you have problems the morning of surgery [phone]   Remember: ONLY 1 PERSON MAY GO WITH YOU TO SHORT STAY TO GET  READY MORNING OF [place].  Do not eat food or drink liquids :After Midnight.     Take these medicines the morning of surgery with A SIP OF WATER: Clonazepam (klonopin), Nexium, Hydrocodone if needed, Valtrex                               You may not have any metal on your body including hair pins and              piercings  Do not wear jewelry, make-up, lotions, powders or perfumes, deodorant             Do not wear nail polish.  Do not shave  48 hours prior to surgery.              .   Do not bring valuables to the hospital. [place].  Contacts, dentures or bridgework may not be worn into surgery.  Leave suitcase in the car. After surgery it may be brought to your room.       Special Instructions:  Coughing and deep breathing exercises, leg exercises              Please read over the following fact sheets you were given: _____________________________________________________________________             [facility] - Preparing for Surgery Before surgery, you can play an important role.  Because skin is not sterile, your skin needs to be as free of germs as possible.  You can reduce the number of germs on your skin by washing with CHG (chlorahexidine gluconate) soap before surgery.  CHG is an antiseptic cleaner which kills germs and bonds with the skin to continue killing germs even after washing. Please DO NOT use if you have an allergy to CHG or antibacterial soaps.  If your skin becomes reddened/irritated stop using the CHG and inform your nurse when you arrive at Short Stay. Do not  shave (including legs and underarms) for at least 48 hours prior to the first CHG shower.  You may shave your face/neck. Please follow these instructions carefully:  1.  Shower with CHG Soap the night before surgery and the  morning of Surgery.  2.  If you choose to wash your hair, wash your hair first as usual with your  normal  shampoo.  3.  After you shampoo, rinse your hair and body thoroughly to remove the  shampoo.                           4.  Use CHG as you would any other liquid soap.  You can apply chg directly  to the skin and wash  Gently with a scrungie or clean washcloth.  5.  Apply the CHG Soap to your body ONLY FROM THE NECK DOWN.   Do not use on face/ open                           Wound or open sores. Avoid contact with eyes, ears mouth and genitals (private parts).                       Wash face,  Genitals (private parts) with your normal soap.             6.  Wash thoroughly, paying special attention to the area where your surgery  will be performed.  7.  Thoroughly rinse your body with warm water from the neck down.  8.  DO NOT shower/wash with your normal soap after using and rinsing off  the CHG Soap.                9.  Pat yourself dry with a clean towel.            10.  Wear clean pajamas.            11.  Place clean sheets on your bed the night of your first shower and do not  sleep with pets. Day of Surgery : Do not apply any lotions/deodorants the morning of surgery.  Please wear clean clothes to the hospital/surgery center.  FAILURE TO FOLLOW THESE INSTRUCTIONS MAY RESULT IN THE CANCELLATION OF YOUR SURGERY PATIENT SIGNATURE_________________________________  NURSE SIGNATURE__________________________________  ________________________________________________________________________

## 2014-09-24 NOTE — Progress Notes (Addendum)
08-29-14 - US/ ABD - EPIC 08-26-14 - ED (JP Drain laceration repair - EPIC  07-30-14 - CT Guided Drainage by Percutaneous catheter - EPIC 07-27-14 - ABD abscess culture - EPIC  06-05-14 - CT ABD/PELVIS - EPIC 05-26-13 - EKG - EPIC 04-25-13 - LOV- Dr. Hulen Skains (gen.surg) - EPIC

## 2014-09-27 DIAGNOSIS — D510 Vitamin B12 deficiency anemia due to intrinsic factor deficiency: Secondary | ICD-10-CM | POA: Diagnosis not present

## 2014-09-28 ENCOUNTER — Encounter (HOSPITAL_COMMUNITY): Payer: Self-pay | Admitting: Registered Nurse

## 2014-09-28 ENCOUNTER — Ambulatory Visit (HOSPITAL_COMMUNITY): Payer: Medicare Other | Admitting: Registered Nurse

## 2014-09-28 ENCOUNTER — Ambulatory Visit (HOSPITAL_COMMUNITY)
Admission: RE | Admit: 2014-09-28 | Discharge: 2014-09-28 | Disposition: A | Discharge status: Home / Self Care | Payer: Medicare Other | Source: Ambulatory Visit | Attending: General Surgery | Admitting: General Surgery

## 2014-09-28 ENCOUNTER — Encounter (HOSPITAL_COMMUNITY)
Admission: RE | Disposition: A | Discharge status: Home / Self Care | Payer: Self-pay | Source: Ambulatory Visit | Attending: General Surgery

## 2014-09-28 DIAGNOSIS — K432 Incisional hernia without obstruction or gangrene: Secondary | ICD-10-CM | POA: Diagnosis not present

## 2014-09-28 DIAGNOSIS — G4730 Sleep apnea, unspecified: Secondary | ICD-10-CM | POA: Insufficient documentation

## 2014-09-28 DIAGNOSIS — S301XXA Contusion of abdominal wall, initial encounter: Secondary | ICD-10-CM | POA: Diagnosis not present

## 2014-09-28 DIAGNOSIS — J45909 Unspecified asthma, uncomplicated: Secondary | ICD-10-CM | POA: Insufficient documentation

## 2014-09-28 DIAGNOSIS — D509 Iron deficiency anemia, unspecified: Secondary | ICD-10-CM | POA: Diagnosis not present

## 2014-09-28 DIAGNOSIS — L7621 Postprocedural hemorrhage and hematoma of skin and subcutaneous tissue following a dermatologic procedure: Secondary | ICD-10-CM | POA: Diagnosis not present

## 2014-09-28 DIAGNOSIS — K219 Gastro-esophageal reflux disease without esophagitis: Secondary | ICD-10-CM | POA: Diagnosis not present

## 2014-09-28 DIAGNOSIS — Z79891 Long term (current) use of opiate analgesic: Secondary | ICD-10-CM | POA: Diagnosis not present

## 2014-09-28 DIAGNOSIS — F411 Generalized anxiety disorder: Secondary | ICD-10-CM | POA: Diagnosis present

## 2014-09-28 DIAGNOSIS — Z79899 Other long term (current) drug therapy: Secondary | ICD-10-CM | POA: Diagnosis not present

## 2014-09-28 DIAGNOSIS — Z87891 Personal history of nicotine dependence: Secondary | ICD-10-CM | POA: Insufficient documentation

## 2014-09-28 DIAGNOSIS — F319 Bipolar disorder, unspecified: Secondary | ICD-10-CM | POA: Diagnosis not present

## 2014-09-28 DIAGNOSIS — Z6832 Body mass index (BMI) 32.0-32.9, adult: Secondary | ICD-10-CM | POA: Insufficient documentation

## 2014-09-28 DIAGNOSIS — Z9884 Bariatric surgery status: Secondary | ICD-10-CM

## 2014-09-28 DIAGNOSIS — M7981 Nontraumatic hematoma of soft tissue: Secondary | ICD-10-CM | POA: Diagnosis not present

## 2014-09-28 DIAGNOSIS — S3011XA Contusion of abdominal wall, initial encounter: Secondary | ICD-10-CM | POA: Diagnosis present

## 2014-09-28 DIAGNOSIS — E669 Obesity, unspecified: Secondary | ICD-10-CM | POA: Insufficient documentation

## 2014-09-28 HISTORY — PX: LAPAROSCOPY: SHX197

## 2014-09-28 SURGERY — LAPAROSCOPY, DIAGNOSTIC
Anesthesia: General | Site: Abdomen

## 2014-09-28 MED ORDER — FENTANYL CITRATE (PF) 100 MCG/2ML IJ SOLN
INTRAMUSCULAR | Status: DC | PRN
  Administered 2014-09-28 (×2): 50 ug via INTRAVENOUS
  Administered 2014-09-28: 100 ug via INTRAVENOUS
  Administered 2014-09-28 (×3): 50 ug via INTRAVENOUS

## 2014-09-28 MED ORDER — ONDANSETRON 4 MG PO TBDP: 4.0000 mg | ORAL_TABLET | Freq: Three times a day (TID) | ORAL | Status: DC | PRN

## 2014-09-28 MED ORDER — PROPOFOL 10 MG/ML IV BOLUS
INTRAVENOUS | Status: DC | PRN
  Administered 2014-09-28: 200 mg via INTRAVENOUS

## 2014-09-28 MED ORDER — NEOSTIGMINE METHYLSULFATE 10 MG/10ML IV SOLN
INTRAVENOUS | Status: DC | PRN
  Administered 2014-09-28: 5 mg via INTRAVENOUS

## 2014-09-28 MED ORDER — SODIUM CHLORIDE 0.9 % IJ SOLN: 3.0000 mL | INTRAMUSCULAR | Status: DC | PRN

## 2014-09-28 MED ORDER — ROCURONIUM BROMIDE 100 MG/10ML IV SOLN
INTRAVENOUS | Status: AC
  Filled 2014-09-28: qty 1

## 2014-09-28 MED ORDER — CHLORHEXIDINE GLUCONATE 4 % EX LIQD: 1.0000 "application " | Freq: Once | CUTANEOUS | Status: DC

## 2014-09-28 MED ORDER — DEXAMETHASONE SODIUM PHOSPHATE 10 MG/ML IJ SOLN
INTRAMUSCULAR | Status: DC | PRN
  Administered 2014-09-28: 10 mg via INTRAVENOUS

## 2014-09-28 MED ORDER — MIDAZOLAM HCL 2 MG/2ML IJ SOLN
INTRAMUSCULAR | Status: AC
  Filled 2014-09-28: qty 4

## 2014-09-28 MED ORDER — OXYCODONE HCL 5 MG PO TABS: 5.0000 mg | ORAL_TABLET | ORAL | Status: DC | PRN

## 2014-09-28 MED ORDER — PROPOFOL 10 MG/ML IV BOLUS
INTRAVENOUS | Status: AC
  Filled 2014-09-28: qty 20

## 2014-09-28 MED ORDER — PROMETHAZINE HCL 25 MG/ML IJ SOLN: 6.2500 mg | INTRAMUSCULAR | Status: DC | PRN

## 2014-09-28 MED ORDER — GLYCOPYRROLATE 0.2 MG/ML IJ SOLN
INTRAMUSCULAR | Status: DC | PRN
  Administered 2014-09-28: .8 mg via INTRAVENOUS

## 2014-09-28 MED ORDER — KETOROLAC TROMETHAMINE 30 MG/ML IJ SOLN
INTRAMUSCULAR | Status: DC | PRN
Start: 2014-09-28 — End: 2014-09-28
  Administered 2014-09-28: 30 mg via INTRAVENOUS

## 2014-09-28 MED ORDER — LACTATED RINGERS IV SOLN
INTRAVENOUS | Status: DC
  Administered 2014-09-28: 1000 mL via INTRAVENOUS

## 2014-09-28 MED ORDER — LIDOCAINE HCL (CARDIAC) 20 MG/ML IV SOLN
INTRAVENOUS | Status: AC
  Filled 2014-09-28: qty 5

## 2014-09-28 MED ORDER — GLYCOPYRROLATE 0.2 MG/ML IJ SOLN
INTRAMUSCULAR | Status: AC
  Filled 2014-09-28: qty 4

## 2014-09-28 MED ORDER — FENTANYL CITRATE (PF) 250 MCG/5ML IJ SOLN
INTRAMUSCULAR | Status: AC
  Filled 2014-09-28: qty 25

## 2014-09-28 MED ORDER — ROCURONIUM BROMIDE 100 MG/10ML IV SOLN
INTRAVENOUS | Status: DC | PRN
  Administered 2014-09-28 (×2): 5 mg via INTRAVENOUS
  Administered 2014-09-28: 10 mg via INTRAVENOUS
  Administered 2014-09-28 (×2): 20 mg via INTRAVENOUS

## 2014-09-28 MED ORDER — ACETAMINOPHEN 325 MG PO TABS: 650.0000 mg | ORAL_TABLET | ORAL | Status: DC | PRN

## 2014-09-28 MED ORDER — SODIUM CHLORIDE 0.9 % IR SOLN
Status: AC
  Filled 2014-09-28: qty 1

## 2014-09-28 MED ORDER — ONDANSETRON HCL 4 MG/2ML IJ SOLN
INTRAMUSCULAR | Status: AC
  Filled 2014-09-28: qty 2

## 2014-09-28 MED ORDER — BUPIVACAINE-EPINEPHRINE 0.25% -1:200000 IJ SOLN
INTRAMUSCULAR | Status: AC
  Filled 2014-09-28: qty 1

## 2014-09-28 MED ORDER — LABETALOL HCL 5 MG/ML IV SOLN
INTRAVENOUS | Status: AC
  Filled 2014-09-28: qty 4

## 2014-09-28 MED ORDER — DEXAMETHASONE SODIUM PHOSPHATE 10 MG/ML IJ SOLN
INTRAMUSCULAR | Status: AC
  Filled 2014-09-28: qty 1

## 2014-09-28 MED ORDER — OXYCODONE-ACETAMINOPHEN 5-325 MG PO TABS
ORAL_TABLET | ORAL | Status: AC
  Administered 2014-09-28: 2
  Filled 2014-09-28: qty 2

## 2014-09-28 MED ORDER — TALC 5 G PL SUSR
5.0000 g | Freq: Once | INTRAPLEURAL | Status: AC
  Administered 2014-09-28: 5 g via INTRAPLEURAL
  Filled 2014-09-28: qty 5

## 2014-09-28 MED ORDER — BUPIVACAINE-EPINEPHRINE 0.25% -1:200000 IJ SOLN
INTRAMUSCULAR | Status: DC | PRN
  Administered 2014-09-28: 50 mL

## 2014-09-28 MED ORDER — HYDROMORPHONE HCL 1 MG/ML IJ SOLN
INTRAMUSCULAR | Status: AC
  Filled 2014-09-28: qty 1

## 2014-09-28 MED ORDER — LABETALOL HCL 5 MG/ML IV SOLN
INTRAVENOUS | Status: DC | PRN
  Administered 2014-09-28 (×2): 2.5 mg via INTRAVENOUS

## 2014-09-28 MED ORDER — DEXTROSE 5 % IV SOLN
3.0000 g | INTRAVENOUS | Status: AC
  Administered 2014-09-28: 3 g via INTRAVENOUS
  Filled 2014-09-28: qty 3000

## 2014-09-28 MED ORDER — ONDANSETRON HCL 4 MG/2ML IJ SOLN
4.0000 mg | Freq: Once | INTRAMUSCULAR | Status: DC | PRN
Start: 2014-09-28 — End: 2014-09-28

## 2014-09-28 MED ORDER — ONDANSETRON HCL 4 MG/2ML IJ SOLN
INTRAMUSCULAR | Status: DC | PRN
  Administered 2014-09-28: 4 mg via INTRAVENOUS

## 2014-09-28 MED ORDER — ACETAMINOPHEN 650 MG RE SUPP
650.0000 mg | RECTAL | Status: DC | PRN
  Filled 2014-09-28: qty 1

## 2014-09-28 MED ORDER — MIDAZOLAM HCL 5 MG/5ML IJ SOLN
INTRAMUSCULAR | Status: DC | PRN
  Administered 2014-09-28: 2 mg via INTRAVENOUS

## 2014-09-28 MED ORDER — SODIUM CHLORIDE 0.9 % IJ SOLN: 3.0000 mL | Freq: Two times a day (BID) | INTRAMUSCULAR | Status: DC

## 2014-09-28 MED ORDER — HYDROMORPHONE HCL 1 MG/ML IJ SOLN
0.2500 mg | INTRAMUSCULAR | Status: DC | PRN
  Administered 2014-09-28 (×2): 0.5 mg via INTRAVENOUS

## 2014-09-28 MED ORDER — FENTANYL CITRATE (PF) 100 MCG/2ML IJ SOLN
INTRAMUSCULAR | Status: AC
  Filled 2014-09-28: qty 4

## 2014-09-28 MED ORDER — SUCCINYLCHOLINE CHLORIDE 20 MG/ML IJ SOLN
INTRAMUSCULAR | Status: DC | PRN
  Administered 2014-09-28: 100 mg via INTRAVENOUS

## 2014-09-28 MED ORDER — OXYCODONE-ACETAMINOPHEN 5-325 MG PO TABS: 2.0000 | ORAL_TABLET | Freq: Once | ORAL | Status: DC

## 2014-09-28 MED ORDER — LIDOCAINE HCL (CARDIAC) 20 MG/ML IV SOLN
INTRAVENOUS | Status: DC | PRN
  Administered 2014-09-28: 100 mg via INTRAVENOUS

## 2014-09-28 MED ORDER — SODIUM CHLORIDE 0.9 % IV SOLN: 250.0000 mL | INTRAVENOUS | Status: DC | PRN

## 2014-09-28 MED ORDER — KETOROLAC TROMETHAMINE 30 MG/ML IJ SOLN
INTRAMUSCULAR | Status: AC
  Filled 2014-09-28: qty 1

## 2014-09-28 SURGICAL SUPPLY — 81 items
ADH SKN CLS APL DERMABOND .7 (GAUZE/BANDAGES/DRESSINGS)
APPLICATOR COTTON TIP 6IN STRL (MISCELLANEOUS) ×1 IMPLANT
APPLIER CLIP 5 13 M/L LIGAMAX5 (MISCELLANEOUS)
APPLIER CLIP ROT 10 11.4 M/L (STAPLE)
APR CLP MED LRG 11.4X10 (STAPLE)
APR CLP MED LRG 5 ANG JAW (MISCELLANEOUS)
BLADE EXTENDED COATED 6.5IN (ELECTRODE) IMPLANT
BLADE HEX COATED 2.75 (ELECTRODE) ×3 IMPLANT
BLADE SURG SZ10 CARB STEEL (BLADE) IMPLANT
CELLS DAT CNTRL 66122 CELL SVR (MISCELLANEOUS) IMPLANT
CHLORAPREP W/TINT 26ML (MISCELLANEOUS) ×3 IMPLANT
CLIP APPLIE 5 13 M/L LIGAMAX5 (MISCELLANEOUS) IMPLANT
CLIP APPLIE ROT 10 11.4 M/L (STAPLE) IMPLANT
COVER MAYO STAND STRL (DRAPES) ×2 IMPLANT
COVER SURGICAL LIGHT HANDLE (MISCELLANEOUS) ×3 IMPLANT
DECANTER SPIKE VIAL GLASS SM (MISCELLANEOUS) ×3 IMPLANT
DERMABOND ADVANCED (GAUZE/BANDAGES/DRESSINGS)
DERMABOND ADVANCED .7 DNX12 (GAUZE/BANDAGES/DRESSINGS) IMPLANT
DRAIN CHANNEL 19F RND (DRAIN) ×2 IMPLANT
DRAPE LAPAROSCOPIC ABDOMINAL (DRAPES) ×3 IMPLANT
DRAPE LG THREE QUARTER DISP (DRAPES) IMPLANT
DRAPE UTILITY XL STRL (DRAPES) ×3 IMPLANT
DRAPE WARM FLUID 44X44 (DRAPE) IMPLANT
ELECT PENCIL ROCKER SW 15FT (MISCELLANEOUS) ×2 IMPLANT
ELECT REM PT RETURN 9FT ADLT (ELECTROSURGICAL) ×3
ELECTRODE REM PT RTRN 9FT ADLT (ELECTROSURGICAL) ×2 IMPLANT
EVACUATOR 1/8 PVC DRAIN (DRAIN) ×2 IMPLANT
GAUZE SPONGE 4X4 12PLY STRL (GAUZE/BANDAGES/DRESSINGS) ×3 IMPLANT
GLOVE BIOGEL M STRL SZ7.5 (GLOVE) ×5 IMPLANT
GLOVE BIOGEL PI IND STRL 7.0 (GLOVE) ×2 IMPLANT
GLOVE BIOGEL PI INDICATOR 7.0 (GLOVE) ×1
GLOVE ECLIPSE 7.5 STRL STRAW (GLOVE) ×3 IMPLANT
GLOVE INDICATOR 8.0 STRL GRN (GLOVE) ×3 IMPLANT
GOWN STRL REUS W/TWL LRG LVL3 (GOWN DISPOSABLE) ×3 IMPLANT
GOWN STRL REUS W/TWL XL LVL3 (GOWN DISPOSABLE) ×12 IMPLANT
KIT BASIN OR (CUSTOM PROCEDURE TRAY) ×3 IMPLANT
LEGGING LITHOTOMY PAIR STRL (DRAPES) IMPLANT
LIGASURE IMPACT 36 18CM CVD LR (INSTRUMENTS) IMPLANT
NDL SAFETY ECLIPSE 18X1.5 (NEEDLE) ×1 IMPLANT
NEEDLE HYPO 18GX1.5 SHARP (NEEDLE) ×3
NEEDLE HYPO 22GX1.5 SAFETY (NEEDLE) ×2 IMPLANT
NS IRRIG 1000ML POUR BTL (IV SOLUTION) ×1 IMPLANT
PACK GENERAL/GYN (CUSTOM PROCEDURE TRAY) ×3 IMPLANT
RETRACTOR WND ALEXIS 18 MED (MISCELLANEOUS) IMPLANT
RTRCTR WOUND ALEXIS 18CM MED (MISCELLANEOUS)
SCISSORS LAP 5X35 DISP (ENDOMECHANICALS) ×3 IMPLANT
SET IRRIG TUBING LAPAROSCOPIC (IRRIGATION / IRRIGATOR) ×2 IMPLANT
SHEARS HARMONIC ACE PLUS 36CM (ENDOMECHANICALS) IMPLANT
SLEEVE XCEL OPT CAN 5 100 (ENDOMECHANICALS) ×3 IMPLANT
SPONGE LAP 18X18 X RAY DECT (DISPOSABLE) IMPLANT
STAPLER VISISTAT 35W (STAPLE) ×3 IMPLANT
STRIP CLOSURE SKIN 1/2X4 (GAUZE/BANDAGES/DRESSINGS) IMPLANT
SUCTION POOLE TIP (SUCTIONS) IMPLANT
SUT ETHILON 3 0 PS 1 (SUTURE) ×4 IMPLANT
SUT MNCRL AB 4-0 PS2 18 (SUTURE) ×3 IMPLANT
SUT PDS AB 1 TP1 96 (SUTURE) IMPLANT
SUT PROLENE 0 CT 2 (SUTURE) ×1 IMPLANT
SUT PROLENE 2 0 KS (SUTURE) IMPLANT
SUT PROLENE 2 0 SH DA (SUTURE) IMPLANT
SUT SILK 2 0 (SUTURE)
SUT SILK 2 0 SH CR/8 (SUTURE) IMPLANT
SUT SILK 2-0 18XBRD TIE 12 (SUTURE) IMPLANT
SUT SILK 3 0 (SUTURE)
SUT SILK 3 0 SH CR/8 (SUTURE) IMPLANT
SUT SILK 3-0 18XBRD TIE 12 (SUTURE) IMPLANT
SUT VIC AB 3-0 SH 27 (SUTURE)
SUT VIC AB 3-0 SH 27X BRD (SUTURE) ×1 IMPLANT
SYR BULB IRRIGATION 50ML (SYRINGE) ×2 IMPLANT
SYR CONTROL 10ML LL (SYRINGE) ×3 IMPLANT
SYS LAPSCP GELPORT 120MM (MISCELLANEOUS)
SYSTEM LAPSCP GELPORT 120MM (MISCELLANEOUS) IMPLANT
TOWEL OR 17X26 10 PK STRL BLUE (TOWEL DISPOSABLE) ×6 IMPLANT
TRAY FOLEY W/METER SILVER 14FR (SET/KITS/TRAYS/PACK) ×3 IMPLANT
TRAY LAPAROSCOPIC (CUSTOM PROCEDURE TRAY) ×3 IMPLANT
TROCAR ADV FIXATION 11X100MM (TROCAR) ×2 IMPLANT
TROCAR ADV FIXATION 12X100MM (TROCAR) ×2 IMPLANT
TROCAR BLADELESS OPT 5 100 (ENDOMECHANICALS) ×3 IMPLANT
TROCAR XCEL NON-BLD 11X100MML (ENDOMECHANICALS) IMPLANT
WATER STERILE IRR 1500ML POUR (IV SOLUTION) ×3 IMPLANT
YANKAUER SUCT BULB TIP 10FT TU (MISCELLANEOUS) IMPLANT
YANKAUER SUCT BULB TIP NO VENT (SUCTIONS) IMPLANT

## 2014-09-28 NOTE — Discharge Instructions (Signed)
[name], P.A. LAPAROSCOPIC SURGERY: POST OP INSTRUCTIONS Always review your discharge instruction sheet given to you by the facility where your surgery was performed. IF YOU HAVE DISABILITY OR FAMILY LEAVE FORMS, YOU MUST BRING THEM TO THE OFFICE FOR PROCESSING.   DO NOT GIVE THEM TO YOUR DOCTOR.  1. A prescription for pain medication may be given to you upon discharge.  Take your pain medication as prescribed, if needed.  If narcotic pain medicine is not needed, then you may take acetaminophen (Tylenol) or ibuprofen (Advil) as needed. 2. Take your usually prescribed medications unless otherwise directed. 3. If you need a refill on your pain medication, please contact your pharmacy.  They will contact our office to request authorization. Prescriptions will not be filled after 5pm or on week-ends. 4. You should follow a light diet the first few days after arrival home, such as soup and crackers, etc.  Be sure to include lots of fluids daily. 5. Most patients will experience some swelling and bruising in the area of the incisions.  Ice packs will help.  Swelling and bruising can take several days to resolve.  6. It is common to experience some constipation if taking pain medication after surgery.  Increasing fluid intake and taking a stool softener (such as Colace) will usually help or prevent this problem from occurring.  A mild laxative (Milk of Magnesia or Miralax) should be taken according to package instructions if there are no bowel movements after 48 hours. 7. Unless discharge instructions indicate otherwise, you may remove your bandages 48 hours after surgery, and you may shower at that time.  You  have steri-strips (small skin tapes) in place directly over the incision.  These strips should be left on the skin for 7-10 days.  If your surgeon used skin glue on the incision, you may shower in 24 hours.  The glue will flake off over the next 2-3 weeks.  Any sutures or staples  will be removed at the office during your follow-up visit. 8. ACTIVITIES:  You may resume regular (light) daily activities beginning the next day--such as daily self-care, walking, climbing stairs--gradually increasing activities as tolerated.  You may have sexual intercourse when it is comfortable.  Refrain from any heavy lifting or straining until approved by your doctor. a. You may drive when you are no longer taking prescription pain medication, you can comfortably wear a seatbelt, and you can safely maneuver your car and apply brakes. 9. You should see your doctor in the office for a follow-up appointment approximately 2-3 weeks after your surgery.  Make sure that you call for this appointment within a day or two after you arrive home to insure a convenient appointment time. 10. OTHER INSTRUCTIONS:  WHEN TO CALL YOUR DOCTOR: 1. Fever over 101.0 2. Inability to urinate 3. Continued bleeding from incision. 4. Increased pain, redness, or drainage from the incision. 5. Increasing abdominal pain  The clinic staff is available to answer your questions during regular business hours.  Please dont hesitate to call and ask to speak to one of the nurses for clinical concerns.  If you have a medical emergency, go to the nearest emergency room or call 911.  A surgeon from [facility] is always on call at the hospital. [address], [place], [place], [place]  [address] ? [address], [place], [place]   [address] [phone] ? [phone] ? FAX [phone] Web site: [email or web address]  Eating techniques 20-20-20 (30-30-30) 20  chews, 20 seconds between bites of food, 20 minutes to eat; sometimes you may need 30 chews, 30 seconds etc Use your nondominant hand to eat with Put fork down between bites of food Use a timer after swallowing to reinforce waiting 20-30 sec between bites of food Use a child/infant size utensil Try not to eat while watching TV  Bulb [place] A bulb drain consists of a thin rubber tube and a soft, round bulb that creates a gentle suction. The rubber tube is placed in the area where you had surgery. A bulb is attached to the end of the tube that is outside the body. The bulb drain removes excess fluid that normally builds up in a surgical wound after surgery. The color and amount of fluid will vary. Immediately after surgery, the fluid is bright red and is a little thicker than water. It may gradually change to a yellow or pink color and become more thin and water-like. When the amount decreases to about 1 or 2 tbsp in 24 hours, your health care provider will usually remove it. DAILY CARE  Keep the bulb flat (compressed) at all times, except while emptying it. The flatness creates suction. You can flatten the bulb by squeezing it firmly in the middle and then closing the cap.  Keep sites where the tube enters the skin dry and covered with a bandage (dressing).  Secure the tube 1-2 in (2.5-5.1 cm) below the insertion sites to keep it from pulling on your stitches. The tube is stitched in place and will not slip out.  Secure the bulb as directed by your health care provider.  For the first 3 days after surgery, there usually is more fluid in the bulb. Empty the bulb whenever it becomes half full because the bulb does not create enough suction if it is too full. The bulb could also overflow. Write down how much fluid you remove each time you empty your drain. Add up the amount removed in 24 hours.  Empty the bulb at the same time every day once the amount of fluid decreases and you only need to empty it once a day. Write down the amounts and the 24-hour totals to give to your health care provider. This helps your health care provider know when the tubes can be removed. EMPTYING THE BULB DRAIN Before emptying the bulb, get a measuring cup, a piece of paper and a pen, and wash your hands.  Gently run your fingers down the tube  (stripping) to empty any drainage from the tubing into the bulb. This may need to be done several times a day to clear the tubing of clots and tissue.  Open the bulb cap to release suction, which causes it to inflate. Do not touch the inside of the cap.  Gently run your fingers down the tube (stripping) to empty any drainage from the tubing into the bulb.  Hold the cap out of the way, and pour fluid into the measuring cup.   Squeeze the bulb to provide suction.  Replace the cap.   Check the tape that holds the tube to your skin. If it is becoming loose, you can remove the loose piece of tape and apply a new one. Then, pin the bulb to your shirt.   Write down the amount of fluid you emptied out. Write down the date and each time you emptied your bulb drain. (If there are 2 bulbs, note the amount of drainage from each bulb  and keep the totals separate. Your health care provider will want to know the total amounts for each drain and which tube is draining more.)   Flush the fluid down the toilet and wash your hands.   Call your health care provider once you have less than 2 tbsp of fluid collecting in the bulb drain every 24 hours. If there is drainage around the tube site, change dressings and keep the area dry. Cleanse around tube with sterile saline and place dry gauze around site. This gauze should be changed when it is soiled. If it stays clean and unsoiled, it should still be changed daily.  SEEK MEDICAL CARE IF:  Your drainage has a bad smell or is cloudy.   You have a fever.   Your drainage is increasing instead of decreasing.   Your tube fell out.   You have redness or swelling around the tube site.   You have drainage from a surgical wound.   Your bulb drain will not stay flat after you empty it.  MAKE SURE YOU:   Understand these instructions.  Will watch your condition.  Will get help right away if you are not doing well or get worse. Document Released:  02/07/2000 Document Revised: 06/26/2013 Document Reviewed: 07/15/2011 [facility] Patient Information 2015 [place], [place]. This information is not intended to replace advice given to you by your health care provider. Make sure you discuss any questions you have with your health care provider.       General Anesthesia, Care After Refer to this sheet in the next few weeks. These instructions provide you with information on caring for yourself after your procedure. Your health care provider may also give you more specific instructions. Your treatment has been planned according to current medical practices, but problems sometimes occur. Call your health care provider if you have any problems or questions after your procedure. WHAT TO EXPECT AFTER THE PROCEDURE After the procedure, it is typical to experience:  Sleepiness.  Nausea and vomiting. HOME CARE INSTRUCTIONS  For the first 24 hours after general anesthesia:  Have a responsible person with you.  Do not drive a car. If you are alone, do not take public transportation.  Do not drink alcohol.  Do not take medicine that has not been prescribed by your health care provider.  Do not sign important papers or make important decisions.  You may resume a normal diet and activities as directed by your health care provider.  Change bandages (dressings) as directed.  If you have questions or problems that seem related to general anesthesia, call the hospital and ask for the anesthetist or anesthesiologist on call. SEEK MEDICAL CARE IF:  You have nausea and vomiting that continue the day after anesthesia.  You develop a rash. SEEK IMMEDIATE MEDICAL CARE IF:   You have difficulty breathing.  You have chest pain.  You have any allergic problems. Document Released: 05/18/2000 Document Revised: 02/14/2013 Document Reviewed: 08/25/2012 [facility] Patient Information 2015 [place], [place]. This information is not intended to replace advice  given to you by your health care provider. Make sure you discuss any questions you have with your health care provider.

## 2014-09-28 NOTE — Progress Notes (Addendum)
Patient returns to room 1302 after diagnostic laparoscopy and drainage of seroma with placement of JP drain. RN assesses dressings and vital signs. RN leaves room to get patient a drink. Comes back to room to find patient removed dressing around [place] and is touching site with her fingers. She states she wanted to see how many sutures there are and what kind of dressing it was. Paper tape patient is taking home for dressing changes is resting against patient's perineum. RN explains that hand washing should be done prior to any dressing change and patient becomes very angry and argues with RN.  RN calls Dr Redmond Pulling to inform him of patient removing dressing. He states this is very typical of her behavior.  Patient does not want her mother to see her prescription for oxycodone IR for pain. She states her mother does not believe in pain medications. She does not want mother to know anything about her discharge information. RN explains that mother will have to sign her discharge instructions as patient has had general anesthesia.  Bella Villa to room by patient. She states she needs to leave immediately in order to pick up her son and dog. Does not want to stay any longer.

## 2014-09-28 NOTE — H&P (View-Only) (Signed)
[name] 09/05/2014 2:45 PM Location: [facility] Patient #: [id number] DOB: 1976/02/12 Divorced / Language: [name] / Race: White Female  History of Present Illness [name] Manny Vitolo MD; 09/13/2014 10:50 AM) Patient words: bariatric.  The patient is a 39 year old female presenting status-post bariatric surgery. She comes in for long-term follow-up after undergoing laparoscopic sleeve gastrectomy on December 22. Her preoperative weight was 320 pounds. I last saw her on June 22. She underwent replacement of her percutaneous drain into her abdominal wall seroma. She ended up in the emergency room because it was leaking somewhat. She states that she has been having more drainage from around the tube site. She denies any fevers or chills. She states that her abdominal pain is overall better. She reports that she is getting around a little bit easier. She denies any food intolerance. She denies any regurgitation or vomiting. She does have some mild heartburn. She had a follow-up ultrasound on July 6 which showed a residual fluid collection measuring 20 cm x 3.2 cm within the subcu anterior abdominal wall. It had significantly decreased since the prior image. ROS - 12 point review of systems was performed and is negative except for what is mentioned in the HPI as well as on the postoperative exam sheet in the paper chart   Problem List/Past Medical Melissa Curry, MD; 09/13/2014 10:53 AM) IRON DEFICIENCY ANEMIA, UNSPECIFIED IRON DEFICIENCY ANEMIA TYPE (280.9  D50.9) OBESITY (BMI 30-39.9) (278.00  E66.9) Depression Anxiety Disorder NAUSEA (787.02  R11.0) HIGH TRIGLYCERIDES (272.1  E78.1) GASTROESOPHAGEAL REFLUX DISEASE WITHOUT ESOPHAGITIS (530.81  K21.9) BIPOLAR AFFECTIVE DISORDER, REMISSION STATUS UNSPECIFIED (296.80  F31.9) ELEVATED BLOOD PRESSURE (401.9  I10) THRUSH (112.0  B37.0) SEROMA (998.13  T14.8) S/P LAPAROSCOPIC SLEEVE GASTRECTOMY (V45.86   Z98.84)02/13/2014 IV weight 316lbs; preop wt 320 lbs  Other Problems Melissa Curry, MD; 04/30/6576 46:96 AM) Umbilical Hernia Repair DYSURIA (788.1  R30.0) Back Pain Diverticulosis Kidney Stone Arthritis  Past Surgical History Melissa Curry, MD; 09/13/2014 10:53 AM) Tonsillectomy Cesarean Section - Multiple Anal Fissure Repair Oral Surgery Colon Removal - Partial2008 perf diverticulitis, Hartman's procedure; then colostomy reversal in 2952 Ventral / Umbilical Hernia [id number] Left. open incisional hernia repair (no mesh, complicated by postop seroma)  Diagnostic Studies History Melissa Curry, MD; 09/13/2014 10:53 AM) Pap Smear 1-5 years ago Mammogram never Colonoscopy 1-5 years ago  Allergies Marjean Donna, [place]; 09/05/2014 3:01 PM) No Known Drug Allergies12/04/2013  Medication History (Sonya Bynum, CMA; 09/05/2014 3:01 PM) Abdominal Binder/Elastic 2XL (1 (one) daily, Taken starting 06/07/2014) Active. Zofran ODT (4MG  Tablet Disperse, 1 (one) Tablet Disperse Oral every six hours, as needed, Taken starting 08/15/2014) Active. (Generic substitution allowed) Hydrocodone-Acetaminophen (5-325MG  Tablet, 1-2 Oral every six hours, as needed, Taken starting 08/15/2014) Active. Promethazine HCl (12.5MG  Tablet, 1 (one) Tablet Oral every six hours, as needed, Taken starting 08/17/2014) Active. Norco (5-325MG  Tablet, 1-2 Tablet Oral every six hours, as needed, Taken starting 08/29/2014) Active. Ambien (10MG  Tablet, Oral as needed) Active. NexIUM (20MG  Capsule DR, Oral) Active. Vyvanse (40MG  Capsule, Oral) Active. Valtrex (500MG  Tablet, Oral) Active. SEROquel (400MG  Tablet, Oral) Active. KlonoPIN (1MG  Tablet, Oral) Active. Nuvigil (250MG  Tablet, Oral) Active. Medications Reconciled  Social History Melissa Curry, MD; 09/13/2014 10:53 AM) Tobacco use Former smoker. Alcohol use Remotely quit alcohol use. Caffeine use Tea. No drug use  Family History  Melissa Curry, MD; 09/13/2014 10:53 AM) Melanoma Daughter. Thyroid problems Mother. Hypertension Mother. Arthritis Mother.  Pregnancy / Birth History Melissa Curry,  MD; 09/13/2014 10:53 AM) [name] Age 3 Para 2 Age at menarche 35 years. Irregular periods Contraceptive History Intrauterine device. Maternal age 35-20  Vitals [name] CMA; 09/05/2014 3:01 PM) 09/05/2014 3:00 PM Weight: 215 lb Height: 68in Body Surface Area: 2.16 m Body Mass Index: 32.69 kg/m Temp.: 51F(Temporal)  Pulse: 77 (Regular)  BP: 140/80 (Sitting, Left Arm, Standard)    Physical Exam [name] Chenille Toor MD; 09/13/2014 10:46 AM) General Mental Status-Alert. General Appearance-Consistent with stated age. Hydration-Well hydrated. Voice-Normal.  Head and Neck Head-normocephalic, atraumatic with no lesions or palpable masses. Trachea-midline. Thyroid Gland Characteristics - normal size and consistency.  Eye Eyeball - Bilateral-Extraocular movements intact. Sclera/Conjunctiva - Bilateral-No scleral icterus.  Chest and Lung Exam Chest and lung exam reveals -quiet, even and easy respiratory effort with no use of accessory muscles and on auscultation, normal breath sounds, no adventitious sounds and normal vocal resonance. Inspection Chest Wall - Normal. Back - normal.  Cardiovascular Cardiovascular examination reveals -normal heart sounds, regular rate and rhythm with no murmurs.  Abdomen Inspection  Inspection of the abdomen reveals: Note: drain in mid abdomen. serous fluid; no cellulitis; skin irritation from tape. Skin - Scar - Note: Incision: clean, dry, and intact; well healed trocar sites; old midline incision. Palpation/Percussion Palpation and Percussion of the abdomen reveal - Soft, No Rebound tenderness, No Rigidity (guarding) and No hepatosplenomegaly. Note: obese abdomen; old scars. has central fullness/?lump - ? recurrence of ventral  seroma. Auscultation Auscultation of the abdomen reveals - Bowel sounds normal.    Assessment & Plan [name] Keyonta Madrid MD; 09/13/2014 10:53 AM) S/P LAPAROSCOPIC SLEEVE GASTRECTOMY (V45.86  Z98.84) Story: IV weight 316lbs; preop wt 320 lbs Impression: The majority of the appointment was spent counseling & reassuring the patient. We rediscussed proper eating techniques and behaviors. It is unclear why she has this persistent seroma. She did have this seroma after her open ventral hernia repair back in 2009 at outside hospital. This could just be a persistent seroma cavity. She is now having these have a fair amount of skin irritation from tape. She requested the drain be removed. We removed the drain. At this point since she has an ongoing seroma despite percutaneous drainage of think we just need to get her the operating room and open up the area and wash it out. Also recommended doing a diagnostic laparoscopy at the time as well. She may have a small ventral incisional hernia around her umbilicus. I told her that if she does have a hernia we would not repair it at the time of her abdominal wall washout since it would be very risky to leave mesh. There is no indication of any bowel contents within the hernia. If needed we may have to primarily repair it; however, my goal is to avoid having to deal with the hernia at the time of her trip back to the operating room. We discussed the risk and benefits of surgery including but not limited to bleeding, infection, recurrence, potential need for temporary drain placement, blood clot formation, injury to surrounding structures,. She will be scheduled for diagnostic laparoscopy, incision and washout and debridement of abdominal wall seroma Current Plans  Pt Education - EMW_bariatricFU SEROMA (998.13  T14.8) Impression: will get repeat u/s to evaluate for resolution of seroma. gave rx for pain/nausea. keep future as scheduling. Current Plans Changed Norco  5-325MG , 1 (one) Tablet every six hours, as needed, #40, 09/05/2014, No Refill. Schedule for Surgery Pt Education - [facility] Free Text Education/Instructions: discussed with patient and  provided information. ELEVATED BLOOD PRESSURE (401.9  I10) IRON DEFICIENCY ANEMIA, UNSPECIFIED IRON DEFICIENCY ANEMIA TYPE (280.9  D50.9) Impression: She was encouraged to start taking iron sulfate 325 mg twice a day. She appears to have gotten supplement over-the-counter. She is also taken some biotin for her hair loss. I reminded her that iron will tend to make her more constipated. I encouraged her to take MiraLAX daily. We also discussed the importance of drinking 64 ounces of water a day. GASTROESOPHAGEAL REFLUX DISEASE WITHOUT ESOPHAGITIS (530.81  K21.9) OBESITY (BMI 30-39.9) (278.00  E66.9) Impression: I congratulated her on her weight loss. She has lost about 90 pounds since surgery. We discussed the importance of making good food choices. I still think she is struggling with this. We rediscussed proper eating technique BIPOLAR AFFECTIVE DISORDER, REMISSION STATUS UNSPECIFIED (296.80  F31.9)   Leighton Ruff. Redmond Pulling, MD, FACS General, Bariatric, & Minimally Invasive Surgery [facility], [place]

## 2014-09-28 NOTE — Progress Notes (Signed)
Pt demanding to speak to Dr Redmond Pulling immediately. States she is dissatisfied at present and needs to talk to him.

## 2014-09-28 NOTE — Transfer of Care (Signed)
Immediate Anesthesia Transfer of Care Note  Patient: [name]  Procedure(s) Performed: Procedure(s): LAPAROSCOPY DIAGNOSTIC, INCISION AND DRAINAGE WITH LAPAROSCOPIC EXPLORATION OF ABDOMINAL WALL SEROMA with ultrasound (N/A)  Patient Location: PACU  Anesthesia Type:General  Level of Consciousness: awake, alert , oriented and patient cooperative  Airway & Oxygen Therapy: Patient Spontanous Breathing and Patient connected to face mask oxygen  Post-op Assessment: Report given to RN, Post -op Vital signs reviewed and stable and Patient moving all extremities  Post vital signs: Reviewed and stable  Last Vitals:  Filed Vitals:   09/28/14 0814  BP: 150/97  Pulse: 110  Temp: 36.8 C  Resp: 18    Complications: No apparent anesthesia complications

## 2014-09-28 NOTE — Progress Notes (Signed)
Dr Redmond Pulling at beside.  Has explained surgery done today,  States pt may be discharged home.  Order received to give percocet 5/325 2 tabs in pacu.

## 2014-09-28 NOTE — Discharge Summary (Signed)
Physician Discharge Summary  DEAISA MERIDA JQB:[id number] DOB: 05-20-1975 DOA: 09/28/2014  PCP: Glo Herring., MD  Admit date: 09/28/2014 Discharge date: 09/28/2014  Recommendations for Outpatient Follow-up:   Follow-up Information    Follow up with [name], MD. Schedule an appointment as soon as possible for a visit in 2 weeks.   Specialty:  General Surgery   Contact information:   [address] [place] [address] [phone]      Discharge Diagnoses:  Active Problems:   Abdominal wall seroma - chronic   Bipolar affective disorder   Anxiety state   S/P laparoscopic sleeve gastrectomy 01/2014  Surgical Procedure: Diagnostic laparoscopy, incision and drainage with laparoscopic exploration of chronic abdominal wall seroma  Discharge Condition: Good Disposition: Home  Diet recommendation: Regular bariatric diet  Filed Weights   09/28/14 0843  Weight: 98.431 kg (217 lb)    Hospital Course:  39 year old obese Caucasian female who underwent a laparoscopic sleeve gastrectomy in December 2015 has had a recurrence of a chronic abdominal wall seroma. This abdominal wall seroma developed after her hernia repair at outside hospital 2009. The seroma cavity redeveloped fluid about 3 months after her sleeve gastrectomy. It is been percutaneously drained twice. Unfortunately the cavity continues to accumulate fluid. This is causing the patient pain and discomfort. She was brought in today for incision and drainage. I incised and drained and explored the seroma cavity in her abdominal wall laparoscopically. I placed talc powder within the cavity and left a surgical drain. The talc powder was placed in hopes to cause the seroma cavity to collapse onto itself. The diagnostic laparoscopy was also performed which demonstrated normal sleeve gastrectomy anatomy. She had adhesions in her midline extending down ventral umbilicus that were mainly omental. Her hepatic flexure and  proximal transverse colon were tethered to the right upper quadrant and upper midline.  I had planned to keep the patient overnight for observation. However the patient requested and desired to be discharged. We offered to keep her overnight. However the patient declined. She met discharge criteria per PACU in short stay guidelines. She was given a pain medicine prescription and nausea medicine prescription. She is familiar with drain care.   Discharge Instructions     Medication List    STOP taking these medications        HYDROcodone-acetaminophen 5-325 MG per tablet  Commonly known as:  NORCO/VICODIN      TAKE these medications        acetaminophen 500 MG tablet  Commonly known as:  TYLENOL  Take 1,000 mg by mouth 4 (four) times daily.     CALCIUM + D PO  Take 1 tablet by mouth daily.     clonazePAM 1 MG tablet  Commonly known as:  KLONOPIN  Take 1 mg by mouth 2 (two) times daily.     esomeprazole 40 MG capsule  Commonly known as:  NEXIUM  Take 40 mg by mouth daily before lunch.     ferrous sulfate 325 (65 FE) MG tablet  Take 650 mg by mouth daily.     HAIR/SKIN/NAILS/BIOTIN Tabs  Take 1 tablet by mouth 2 (two) times daily.     lisdexamfetamine 40 MG capsule  Commonly known as:  VYVANSE  Take 40 mg by mouth daily.     multivitamin with minerals Tabs tablet  Take 1 tablet by mouth 2 (two) times daily.     NUVIGIL 250 MG tablet  Generic drug:  Armodafinil  Take 125  mg by mouth daily.     ondansetron 4 MG disintegrating tablet  Commonly known as:  ZOFRAN-ODT  Take 4 mg by mouth every 6 (six) hours as needed for nausea or vomiting.     ondansetron 4 MG disintegrating tablet  Commonly known as:  ZOFRAN ODT  Take 1 tablet (4 mg total) by mouth every 8 (eight) hours as needed for nausea or vomiting.     oxyCODONE 5 MG immediate release tablet  Commonly known as:  Oxy IR/ROXICODONE  Take 1 tablet (5 mg total) by mouth every 4 (four) hours as needed for moderate  pain, severe pain or breakthrough pain.     promethazine 12.5 MG tablet  Commonly known as:  PHENERGAN  Take 1-2 tablets (12.5-25 mg total) by mouth every 8 (eight) hours as needed for nausea.     QUEtiapine 400 MG tablet  Commonly known as:  SEROQUEL  Take 800 mg by mouth at bedtime.     valACYclovir 500 MG tablet  Commonly known as:  VALTREX  Take 500 mg by mouth daily.     zolpidem 10 MG tablet  Commonly known as:  AMBIEN  Take 10 mg by mouth at bedtime.           Follow-up Information    Follow up with [name], MD. Schedule an appointment as soon as possible for a visit in 2 weeks.   Specialty:  General Surgery   Contact information:   [address] [address] [phone]        The results of significant diagnostics from this hospitalization (including imaging, microbiology, ancillary and laboratory) are listed below for reference.    Significant Diagnostic Studies: No results found.  Microbiology: No results found for this or any previous visit (from the past 240 hour(s)).   Labs: Basic Metabolic Panel:  Recent Labs Lab 09/24/14 1435  NA 137  K 3.9  CL 103  CO2 27  GLUCOSE 85  BUN 10  CREATININE 0.54  CALCIUM 8.6*   Liver Function Tests:  Recent Labs Lab 09/24/14 1435  AST 12*  ALT 14  ALKPHOS 73  BILITOT 0.4  PROT 7.1  ALBUMIN 3.8   No results for input(s): LIPASE, AMYLASE in the last 168 hours. No results for input(s): AMMONIA in the last 168 hours. CBC:  Recent Labs Lab 09/24/14 1435  WBC 9.3  NEUTROABS 6.4  HGB 11.7*  HCT 37.4  MCV 89.7  PLT 414*   Cardiac Enzymes: No results for input(s): CKTOTAL, CKMB, CKMBINDEX, TROPONINI in the last 168 hours. BNP: BNP (last 3 results) No results for input(s): BNP in the last 8760 hours.  ProBNP (last 3 results) No results for input(s): PROBNP in the last 8760 hours.  CBG: No results for input(s): GLUCAP in the last 168 hours.  Active Problems:    * No active hospital problems. *   Time coordinating discharge: 15 min  Signed:  [name], MD [facility], [place] 09/28/2014, 6:04 PM

## 2014-09-28 NOTE — Brief Op Note (Signed)
09/28/2014  2:25 PM  PATIENT:  [name]  39 y.o. female  PRE-OPERATIVE DIAGNOSIS:  chronic abdominal wall seroma, incisional hernia  POST-OPERATIVE DIAGNOSIS:  chronic abdominal wall seroma, incisional hernia  PROCEDURE:  Procedure(s): LAPAROSCOPY DIAGNOSTIC, INCISION AND DRAINAGE WITH LAPAROSCOPIC EXPLORATION OF ABDOMINAL WALL SEROMA with ultrasound (N/A)  SURGEON:  Surgeon(s) and Role:    * Greer Pickerel, MD - Primary  PHYSICIAN ASSISTANT:   ASSISTANTS: Servando Snare RNFA   ANESTHESIA:   general  EBL:  Total I/O In: 1700 [I.V.:1650; IV Piggyback:50] Out: 847 [Urine:650; Blood:25]  BLOOD ADMINISTERED:none  DRAINS: (18 fr) Blake drain(s) in the seroma cavity   LOCAL MEDICATIONS USED:  MARCAINE     SPECIMEN:  Aspirate  DISPOSITION OF SPECIMEN:  micro  COUNTS:  YES  TOURNIQUET:  * No tourniquets in log *  DICTATION: .Other Dictation: Dictation Number [id number]  PLAN OF CARE: Discharge to home after PACU  PATIENT DISPOSITION:  PACU - hemodynamically stable.   Delay start of Pharmacological VTE agent (>24hrs) due to surgical blood loss or risk of bleeding: not applicable   Leighton Ruff. Redmond Pulling, MD, FACS General, Bariatric, & Minimally Invasive Surgery [facility], [place]

## 2014-09-28 NOTE — Anesthesia Postprocedure Evaluation (Signed)
  Anesthesia Post-op Note  Patient: [name]  Procedure(s) Performed: Procedure(s) (LRB): LAPAROSCOPY DIAGNOSTIC, INCISION AND DRAINAGE WITH LAPAROSCOPIC EXPLORATION OF ABDOMINAL WALL SEROMA with ultrasound (N/A)  Patient Location: PACU  Anesthesia Type: General  Level of Consciousness: awake and alert   Airway and Oxygen Therapy: Patient Spontanous Breathing  Post-op Pain: mild  Post-op Assessment: Post-op Vital signs reviewed, Patient's Cardiovascular Status Stable, Respiratory Function Stable, Patent Airway and No signs of Nausea or vomiting  Last Vitals:  Filed Vitals:   09/28/14 1430  BP: 142/89  Pulse: 100  Temp:   Resp: 20    Post-op Vital Signs: stable   Complications: No apparent anesthesia complications

## 2014-09-28 NOTE — Interval H&P Note (Signed)
History and Physical Interval Note:  09/28/2014 10:33 AM  [name]  has presented today for surgery, with the diagnosis of chronic abdomain wall seroma, incisional hernia  The various methods of treatment have been discussed with the patient and family. After consideration of risks, benefits and other options for treatment, the patient has consented to  Procedure(s): LAPAROSCOPY DIAGNOSTIC INCISION AND DRAINAGE ABDOMINAL WALL SEROMA (N/A) POSSIBLE OPEN REPAIR INCISIONAL HERNIA  (N/A) as a surgical intervention .  The patient's history has been reviewed, patient examined, no change in status, stable for surgery.  I have reviewed the patient's chart and labs.  Questions were answered to the patient's satisfaction.    Leighton Ruff. Redmond Pulling, MD, [place], Bariatric, & Minimally Invasive Surgery [facility], [place]   [facility]

## 2014-09-28 NOTE — Anesthesia Preprocedure Evaluation (Addendum)
Anesthesia Evaluation  Patient identified by MRN, date of birth, ID band Patient awake    Reviewed: Allergy & Precautions, H&P , NPO status , Patient's Chart, lab work & pertinent test results  History of Anesthesia Complications (+) Family history of anesthesia reaction  Airway Mallampati: II  TM Distance: >3 FB Neck ROM: Full    Dental no notable dental hx.    Pulmonary asthma , sleep apnea , former smoker,  breath sounds clear to auscultation  Pulmonary exam normal       Cardiovascular negative cardio ROS Normal cardiovascular examRhythm:Regular Rate:Normal     Neuro/Psych  Headaches, PSYCHIATRIC DISORDERS Anxiety Depression Bipolar Disorder    GI/Hepatic Neg liver ROS, GERD-  ,  Endo/Other  Morbid obesity  Renal/GU negative Renal ROS  negative genitourinary   Musculoskeletal negative musculoskeletal ROS (+)   Abdominal   Peds negative pediatric ROS (+)  Hematology  (+) anemia ,   Anesthesia Other Findings NPO appropriate, allergies reviewed Denies active cardiac or pulmonary symptoms, METS > 4 No recent congestive cough or symptoms of upper respiratory infection Meds -   Doesn't tolerate tylenol well, taking large dose antipsychotic medication in addition to frequent zofran, phenergan, doesn't want scopolamine patch given risk of drowsiness  METS>4, denies cardiac symptoms, NPO appropriate  Reproductive/Obstetrics negative OB ROS                         Anesthesia Physical  Anesthesia Plan  ASA: III  Anesthesia Plan: General   Post-op Pain Management:    Induction: Intravenous  Airway Management Planned: Oral ETT  Additional Equipment:   Intra-op Plan:   Post-operative Plan: Extubation in OR  Informed Consent: I have reviewed the patients History and Physical, chart, labs and discussed the procedure including the risks, benefits and alternatives for the proposed  anesthesia with the patient or authorized representative who has indicated his/her understanding and acceptance.   Dental advisory given  Plan Discussed with: CRNA and Anesthesiologist  Anesthesia Plan Comments:        Anesthesia Quick Evaluation

## 2014-09-28 NOTE — Anesthesia Procedure Notes (Signed)
Procedure Name: Intubation Date/Time: 09/28/2014 10:56 AM Performed by: [name] A Pre-anesthesia Checklist: Patient identified, Timeout performed, Emergency Drugs available, Suction available and Patient being monitored Patient Re-evaluated:Patient Re-evaluated prior to inductionOxygen Delivery Method: Circle system utilized Preoxygenation: Pre-oxygenation with 100% oxygen Intubation Type: IV induction Ventilation: Mask ventilation without difficulty Laryngoscope Size: Mac and 4 Grade View: Grade I Tube type: Oral Tube size: 7.5 mm Number of attempts: 1 Airway Equipment and Method: Stylet Placement Confirmation: ETT inserted through vocal cords under direct vision,  breath sounds checked- equal and bilateral and positive ETCO2 Secured at: 21 cm Tube secured with: Tape Dental Injury: Teeth and Oropharynx as per pre-operative assessment

## 2014-09-29 NOTE — Op Note (Signed)
NAMEDONNAH, [name]                ACCOUNT NO.:  [id number]  MEDICAL RECORD NO.:  [id number]  LOCATION:  [facility]                         FACILITY[facility]  [facility]  PHYSICIAN:  Leighton Ruff. Redmond Pulling, MD, FACSDATE OF BIRTH:  1975-09-04  DATE OF PROCEDURE:  09/28/2014 DATE OF DISCHARGE:  09/28/2014                              OPERATIVE REPORT   PREOPERATIVE DIAGNOSIS: 1. Chronic abdominal wall seroma. 2. Incisional hernia.  POSTOPERATIVE DIAGNOSIS: 1. Chronic abdominal wall seroma. 2. Incisional hernia.  PROCEDURE: 1. Diagnostic laparoscopy. 2. Incision and drainage and laparoscopic exploration of abdominal     wall seroma using ultrasound guidance.  SURGEON:  Leighton Ruff. Redmond Pulling, MD, FACS  ASSISTANT:  Servando Snare, RNFA.  ANESTHESIA:  General.  ESTIMATED BLOOD LOSS:  Minimal.  SPECIMEN:  Fluid from the abdominal wall seroma.  INDICATIONS:  The patient is a pleasant 39 year old female, who I initially met in 2015 for consideration for weight loss surgery.  She ultimately underwent a laparoscopic sleeve gastrectomy in December of 2015.  She had extensive prior abdominal surgery.  In 2008, she had a perforated diverticulitis that requiring Hartmann's procedure.  She had the colostomy taken down and then ultimately developed an incisional hernia.  This was repaired in 2009 at outside hospital with about primary repair.  Her course at that time was complicated by an abdominal wall seroma.  In reviewing her outside hospital records prior to her sleeve gastrectomy, they ended up having to open up the seroma and Evacuate it.  All that was back in 2009.  Interestingly about 3 months after sleeve gastrectomy, she presented to the office complaining of abdominal wall fullness and pain.  A CT scan was done, which demonstrated an abdominal wall seroma likely from her ventral hernia repair.  It did have reaccumulated fluid.  It was mainly in her upper midline extending more to the left side.  It was  percutaneously drained by IR, and she kept the drain in for about 6-8 weeks.  It got smaller, but ultimately she desired removal because it was making her very uncomfortable, and she requested it be removed adamantly.  Unfortunately as expected the seroma became fluid filled again, and she underwent another percutaneous drain placement.  She was only able to keep this one in short period of time until she demanded it to be released. Because she had failed 2 percutaneous attempts and it was still causing ongoing pain requiring narcotic administration and multiple trips to the office, I recommended that we try to incise and drain it and possibly put talc powder in it to try to get it to scar down and obliterate itself.  She did have, on CT, ongoing recurrent small incisional hernia around this location that appeared to just contain omentum.  I also recommended doing a diagnostic laparoscopy just to evaluate her abdomen. We discussed at length the risks and benefits of surgery including, but not limited to, bleeding, infection, injury to surrounding structures, needing to convert to an open procedure, seroma recurrence, blood clot formation, failure to ameliorate her pain, anesthesia concerns.  She elected to proceed with surgery.  DESCRIPTION OF PROCEDURE:  She was taken the OR 2 at [facility]  placed supine on the operating table.  General endotracheal anesthesia was established.  Sequential compression devices were placed. A Foley catheter was placed.  Prior to prepping and draping, I did a limited ultrasound on her abdominal wall in the supraumbilical position slightly to the left of the midline.  I identified a pocket of fluid in her seroma cavity.  After prepping the area with Betadine, I aspirated about 15 mL of red-tinged straw-colored fluid.  It was sent for analysis.  It appeared that the seroma cavity was probably about the same size as it was on CT from month or 2 ago.   At this point, we prepped and draped in the usual standard surgical fashion.  I made a small incision directly in this area slightly above and to the left of the umbilicus.  I then tried to optiview it, however, she had a very tough thick seroma cavity, and I just could not penetrate it under OptiView guidance without concern for going too deep.  Therefore, I then tried to use a Veress needle.  Then, I got into the cavity using ultrasound guidance.  I was able to insufflate it, but it appeared that there was still a fair amount of fluid within the abscess cavity limiting insufflation without getting high-pressure alarm.  I was able to aspirate more fluid through the Veress needle, and I aspirated probably a total of 55-65 mL of fluid.  I was able to put some air in the cavity, but it did not become as tense as I had thought it would be. Therefore, I just elected to use a small incision and cut down through the subcutaneous tissue.  I opened up the rind of the seroma cavity and got into it.  I was able to do a finger sweep.  I placed a stay suture on the edge of the cavity, I then placed an 11-mm balloon tipped trocar into the cavity and was able to advance the laparoscope and surveilled. The cavity was not as large.  It is the same size in dimensions as the most recent CT scan.  I was able to place a 5-mm trocar in the left lateral abdominal wall into the seroma cavity under guidance.  I then used Bovie electrocautery to score the majority of the surface of the cavity.  We then up sized the 5-mm trocar until the 11, and I threaded a Blake drain into the cavity.  It was secured to the skin with a 3-0 nylon.  I placed 1 or 2 additional sutures on the skin surface around the drain.  At this point, I wanted to do a diagnostic laparoscopy.  I entered the abdomen in the left upper quadrant on the second attempt just below the left subcostal margin.  I was able to visualize the stomach & its  sleeve configuration, it appeared grossly normal.  It was not abnormally dilated.  There was a fair amount of omental adhesions in the midline extending down to the umbilicus, and appeared that the transverse colon and hepatic flexure were adhered to the right upper quadrant abdominal wall.  I made an incision in the right upper quadrant above where the colon was adhered to the abdominal wall just selected to inspect the left upper quadrant where I gained access to the abdomen.  In the left upper quadrant, the trocar had gone through the veil of some of the omentum that was adhered to the abdominal wall, but I was able to get around that area.  The bowel was inspected, there was no evidence of injury to the bowel.  She just had a fair amount of omentum adhered to the midline extending down to the umbilicus.  It appeared that she had a small plug of fat at the level of the umbilicus, probably with small fascial defect.  There was no evidence of bowel trapped within the hernia, and there was no small bowel attached to the abdominal wall.  The only bowel that was sort of lifted up towards the abdominal wall was in the right upper quadrant towards the midline.  The hepatic flexure, it was just being anchored up due to the omentum being tethered to the abdominal wall.  At this point, the trocars from the abdominal cavity were removed.  The balloon tipped trocar in the seroma cavity was removed.  I then reapproximated the wall of the seroma cavity that I had opened up with interrupted 0 Vicryl x2. I then irrigated the small incision with antibiotic irrigation and closed the deep dermis with inverted interrupted 3-0 Vicryl, and the remaining skin incisions were closed with a 4-0 Monocryl followed by the application of benzoin and Steri-Strips.  All needle, instrument, sponge counts were correct x2.  There were no immediate complications.  The patient tolerated the procedure well.     Leighton Ruff.  Redmond Pulling, MD, FACS     EMW/MEDQ  D:  09/28/2014  T:  09/29/2014  Job:  [id number]

## 2014-09-30 LAB — WOUND CULTURE: Culture: NO GROWTH

## 2014-10-01 ENCOUNTER — Emergency Department (HOSPITAL_COMMUNITY)
Admission: EM | Admit: 2014-10-01 | Discharge: 2014-10-01 | Disposition: A | Discharge status: Home / Self Care | Payer: Medicare Other | Attending: Emergency Medicine | Admitting: Emergency Medicine

## 2014-10-01 ENCOUNTER — Encounter (HOSPITAL_COMMUNITY): Payer: Self-pay | Admitting: General Surgery

## 2014-10-01 DIAGNOSIS — Z8739 Personal history of other diseases of the musculoskeletal system and connective tissue: Secondary | ICD-10-CM | POA: Insufficient documentation

## 2014-10-01 DIAGNOSIS — F419 Anxiety disorder, unspecified: Secondary | ICD-10-CM | POA: Diagnosis not present

## 2014-10-01 DIAGNOSIS — Z79899 Other long term (current) drug therapy: Secondary | ICD-10-CM | POA: Diagnosis not present

## 2014-10-01 DIAGNOSIS — Z8669 Personal history of other diseases of the nervous system and sense organs: Secondary | ICD-10-CM | POA: Insufficient documentation

## 2014-10-01 DIAGNOSIS — Z933 Colostomy status: Secondary | ICD-10-CM | POA: Diagnosis not present

## 2014-10-01 DIAGNOSIS — G8918 Other acute postprocedural pain: Secondary | ICD-10-CM

## 2014-10-01 DIAGNOSIS — R1032 Left lower quadrant pain: Secondary | ICD-10-CM | POA: Diagnosis not present

## 2014-10-01 DIAGNOSIS — Z4801 Encounter for change or removal of surgical wound dressing: Secondary | ICD-10-CM | POA: Diagnosis present

## 2014-10-01 DIAGNOSIS — D649 Anemia, unspecified: Secondary | ICD-10-CM | POA: Insufficient documentation

## 2014-10-01 DIAGNOSIS — F319 Bipolar disorder, unspecified: Secondary | ICD-10-CM | POA: Diagnosis not present

## 2014-10-01 DIAGNOSIS — K219 Gastro-esophageal reflux disease without esophagitis: Secondary | ICD-10-CM | POA: Diagnosis not present

## 2014-10-01 DIAGNOSIS — Z87891 Personal history of nicotine dependence: Secondary | ICD-10-CM | POA: Insufficient documentation

## 2014-10-01 DIAGNOSIS — J45909 Unspecified asthma, uncomplicated: Secondary | ICD-10-CM | POA: Diagnosis not present

## 2014-10-01 MED ORDER — HYDROMORPHONE HCL 1 MG/ML IJ SOLN
1.0000 mg | Freq: Once | INTRAMUSCULAR | Status: DC
Start: 2014-10-01 — End: 2014-10-01

## 2014-10-01 MED ORDER — TRAMADOL HCL 50 MG PO TABS: 50.0000 mg | ORAL_TABLET | Freq: Four times a day (QID) | ORAL | Status: DC | PRN

## 2014-10-01 MED ORDER — OXYCODONE HCL 5 MG PO TABS: 5.0000 mg | ORAL_TABLET | ORAL | Status: DC | PRN

## 2014-10-01 MED ORDER — PERCOCET 5-325 MG PO TABS: 1.0000 | ORAL_TABLET | Freq: Four times a day (QID) | ORAL | Status: DC | PRN

## 2014-10-01 NOTE — ED Notes (Signed)
Nurse took all old bandages off. Cleaned patient up. Patient has a jackson pratt to the left side of her abdomen. The jackson pratt was unclogged. The Glennon Mac pratt is draining now. Wound redressed with gauze and taped down.

## 2014-10-01 NOTE — ED Provider Notes (Signed)
CSN: [id number]     Arrival date & time 10/01/14  6269 History   First MD Initiated Contact with Patient 10/01/14 0401     Chief Complaint  Patient presents with  . Drainage from Incision     (Consider location/radiation/quality/duration/timing/severity/associated sxs/prior Treatment) HPI   Patient is a 39 year old female who underwent surgical repair of her seroma by [name] on Friday who now presents to the ED with increased drainage around her JP.  Patient states  she began having thick colored drainage around her JP Sunday afternoon.  She called the on-call service and attempted to manage the drainage on her own at home, however was unable to unclog her JP.  She states she has had difficulty managing her pain at home.  Her abdominal pain radiates into her right clavicle. She denies fever, chills, shortness of breath, chest pain, nausea, vomiting.   Past Medical History  Diagnosis Date  . GERD (gastroesophageal reflux disease)   . Diverticulitis 2008    perforated/ requiring resection  . Colostomy in place   . Bipolar affective   . Depression   . Anxiety   . Asthma   . Arthritis   . Blood transfusion without reported diagnosis   . Substance abuse     12  years ago-crack cocaine  . Family history of adverse reaction to anesthesia     MGM was placed on vent after surgery  . Sleep apnea     mild, does not use c-pap machine  . Shortness of breath dyspnea   . Headache   . Anemia    Past Surgical History  Procedure Laterality Date  . Exploratory laparotomy with resection  2008  . Colostomy reversal  11/2006  . Abdominal wall hernia  07/2007    open repair with lysis of adhesions  . Hx of abd wall seroma  08/2007    Drained via [place] in [place], [place]  . Hx of abd wall seroma  10/2007    Drained by Dr. Geroge Baseman in office  . Tonsillectomy    . Kidney stones    . Hernia repair    . Examination under anesthesia N/A 05/11/2012    Procedure: EXAM UNDER ANESTHESIA;  Surgeon: Donato Heinz, MD;  Location: [facility];  Service: General;  Laterality: N/A;  . Placement of seton N/A 05/11/2012    Procedure: PLACEMENT OF SETON;  Surgeon: Donato Heinz, MD;  Location: [facility];  Service: General;  Laterality: N/A;  . Treatment fistula anal    . Finger closed reduction Right 08/05/2012    Procedure: CLOSED REDUCTION RIGHT THUMB (FINGER);  Surgeon: Linna Hoff, MD;  Location: [facility];  Service: Orthopedics;  Laterality: Right;  . Esophagogastroduodenoscopy  03/13/2010    [id number] esophagus, status post passage of a Maloney dilator/Small hiatal hernia/ Antral erosions, status post biopsy  . Colonoscopy  03/13/2010    [id number] hemorrhoids likely cause of hematochezia, otherwise normal  . Laparoscopic gastric sleeve resection N/A 02/13/2014    Procedure: LAPAROSCOPIC GASTRIC SLEEVE RESECTION LYSIS OF ADHESIONS, UPPER ENDOSCOPY;  Surgeon: [name], MD;  Location: [facility];  Service: General;  Laterality: N/A;  . Root canal 7-16     Family History  Problem Relation Age of Onset  . Asthma Maternal Grandmother    History  Substance Use Topics  . Smoking status: Former Smoker -- 0.50 packs/day for 14 years    Types: E-cigarettes    Quit date: 12/20/2013  . Smokeless tobacco: Never  Used  . Alcohol Use: No   OB History    No data available     Review of Systems All other systems negative except as documented in the HPI. All pertinent positives and negatives as reviewed in the HPI.   Allergies  Review of patient's allergies indicates no known allergies.  Home Medications   Prior to Admission medications   Medication Sig Start Date End Date Taking? Authorizing Provider  acetaminophen (TYLENOL) 500 MG tablet Take 1,000 mg by mouth 4 (four) times daily.   Yes Historical Provider, MD  Armodafinil (NUVIGIL) 250 MG tablet Take 125 mg by mouth daily.    Yes Historical Provider, MD  Calcium Carbonate-Vitamin D (CALCIUM + D PO) Take 1 tablet by mouth daily.   Yes  Historical Provider, MD  clonazePAM (KLONOPIN) 1 MG tablet Take 1 mg by mouth 2 (two) times daily.    Yes Historical Provider, MD  esomeprazole (NEXIUM) 40 MG capsule Take 40 mg by mouth daily before lunch.   Yes Historical Provider, MD  ferrous sulfate 325 (65 FE) MG tablet Take 650 mg by mouth daily.   Yes Historical Provider, MD  lisdexamfetamine (VYVANSE) 40 MG capsule Take 40 mg by mouth daily.    Yes Historical Provider, MD  Multiple Vitamin (MULTIVITAMIN WITH MINERALS) TABS tablet Take 1 tablet by mouth 2 (two) times daily.   Yes Historical Provider, MD  Multiple Vitamins-Minerals (HAIR/SKIN/NAILS/BIOTIN) TABS Take 1 tablet by mouth 2 (two) times daily.   Yes Historical Provider, MD  ondansetron (ZOFRAN-ODT) 4 MG disintegrating tablet Take 4 mg by mouth every 6 (six) hours as needed for nausea or vomiting.  08/15/14  Yes Historical Provider, MD  oxyCODONE (OXY IR/ROXICODONE) 5 MG immediate release tablet Take 1 tablet (5 mg total) by mouth every 4 (four) hours as needed for moderate pain, severe pain or breakthrough pain. 09/28/14  Yes Greer Pickerel, MD  QUEtiapine (SEROQUEL) 400 MG tablet Take 800 mg by mouth at bedtime.   Yes Historical Provider, MD  valACYclovir (VALTREX) 500 MG tablet Take 500 mg by mouth daily.  03/01/13  Yes Historical Provider, MD  zolpidem (AMBIEN) 10 MG tablet Take 10 mg by mouth at bedtime.    Yes Historical Provider, MD  ondansetron (ZOFRAN ODT) 4 MG disintegrating tablet Take 1 tablet (4 mg total) by mouth every 8 (eight) hours as needed for nausea or vomiting. Patient not taking: Reported on 10/01/2014 09/28/14   Greer Pickerel, MD  promethazine (PHENERGAN) 12.5 MG tablet Take 1-2 tablets (12.5-25 mg total) by mouth every 8 (eight) hours as needed for nausea. Patient taking differently: Take 12.5-25 mg by mouth every 6 (six) hours as needed for nausea or vomiting.  02/16/14   Alphonsa Overall, MD   BP 140/104 mmHg  Pulse 118  Temp(Src) 97.8 F (36.6 C) (Oral)  Resp 20  Ht 5'  8" (1.727 m)  Wt 215 lb (97.523 kg)  BMI 32.70 kg/m2  SpO2 100% Physical Exam  Constitutional: She is oriented to person, place, and time. She appears well-developed and well-nourished. No distress.  Cardiovascular: Normal rate and regular rhythm.  Exam reveals no friction rub.   No murmur heard. Pulmonary/Chest: Effort normal and breath sounds normal. No respiratory distress.  Abdominal: Soft. She exhibits no distension. There is tenderness (mild over the left lower quadrant). There is no rebound.  Patient with surgical incision above her umbilicus.  JP drain in place with serosanguinous output.  Thick drainage is present from her drain site. No  erythema. Area is not warm to the touch.    Neurological: She is alert and oriented to person, place, and time.  Skin: Skin is warm and dry.  Nursing note and vitals reviewed.   ED Course  Procedures (including critical care time) The drain was irrigated and cleared the patient states that the fluid appears to be fluid that has been draining the entire time.  I do not see any signs of infection.  She states that she will follow-up with [name] as soon as possible   Dalia Heading, PA-C 10/02/14 [phone]

## 2014-10-01 NOTE — ED Provider Notes (Signed)
Medical screening examination/treatment/procedure(s) were conducted as a shared visit with non-physician practitioner(s) and myself.  I personally evaluated the patient during the encounter.   EKG Interpretation None      Pt is a 39 y.o. female with history of abdominal wall seroma who recently had a JP drain placed by [name] who presents to the emergency department with drainage around her JP site. States that it is no longer draining through the drain. No fever. No increased pain from baseline. States drainage has not changed in consistency. Abdominal exam is benign. JP drain was flushed is not draining appropriately. No drainage around the drain anymore. No fluctuance or induration. No erythema or warmth. Patient was initially tachycardic but this has improved. We'll discharge home.  [place], DO 10/01/14 [phone]

## 2014-10-01 NOTE — ED Notes (Signed)
Patient requested not to have the Percocet due to it makes her vomit and her doctor did not want her to have it. MD made aware.

## 2014-10-01 NOTE — Discharge Instructions (Signed)
Return here as needed. Follow up with [name]

## 2014-10-01 NOTE — ED Notes (Signed)
Patient states that pus is coming out of her jackson-pratt drain. She states that it's "pouring" out. She currently has gauze around it.

## 2014-10-03 DIAGNOSIS — F319 Bipolar disorder, unspecified: Secondary | ICD-10-CM | POA: Diagnosis not present

## 2014-10-03 LAB — ANAEROBIC CULTURE

## 2014-10-06 ENCOUNTER — Emergency Department (HOSPITAL_COMMUNITY)
Admission: EM | Admit: 2014-10-06 | Discharge: 2014-10-06 | Disposition: A | Discharge status: Home / Self Care | Payer: Medicare Other | Attending: Emergency Medicine | Admitting: Emergency Medicine

## 2014-10-06 ENCOUNTER — Encounter (HOSPITAL_COMMUNITY): Payer: Self-pay | Admitting: *Deleted

## 2014-10-06 ENCOUNTER — Telehealth: Payer: Self-pay | Admitting: General Surgery

## 2014-10-06 DIAGNOSIS — J45909 Unspecified asthma, uncomplicated: Secondary | ICD-10-CM | POA: Diagnosis not present

## 2014-10-06 DIAGNOSIS — K219 Gastro-esophageal reflux disease without esophagitis: Secondary | ICD-10-CM | POA: Diagnosis not present

## 2014-10-06 DIAGNOSIS — Z933 Colostomy status: Secondary | ICD-10-CM | POA: Diagnosis not present

## 2014-10-06 DIAGNOSIS — D649 Anemia, unspecified: Secondary | ICD-10-CM | POA: Insufficient documentation

## 2014-10-06 DIAGNOSIS — Z87891 Personal history of nicotine dependence: Secondary | ICD-10-CM | POA: Insufficient documentation

## 2014-10-06 DIAGNOSIS — M1990 Unspecified osteoarthritis, unspecified site: Secondary | ICD-10-CM | POA: Insufficient documentation

## 2014-10-06 DIAGNOSIS — Z79899 Other long term (current) drug therapy: Secondary | ICD-10-CM | POA: Diagnosis not present

## 2014-10-06 DIAGNOSIS — Z8669 Personal history of other diseases of the nervous system and sense organs: Secondary | ICD-10-CM | POA: Diagnosis not present

## 2014-10-06 DIAGNOSIS — F319 Bipolar disorder, unspecified: Secondary | ICD-10-CM | POA: Diagnosis not present

## 2014-10-06 DIAGNOSIS — F419 Anxiety disorder, unspecified: Secondary | ICD-10-CM | POA: Diagnosis not present

## 2014-10-06 DIAGNOSIS — G8918 Other acute postprocedural pain: Secondary | ICD-10-CM | POA: Diagnosis present

## 2014-10-06 LAB — BASIC METABOLIC PANEL
Anion gap: 6 (ref 5–15)
BUN: 10 mg/dL (ref 6–20)
CO2: 27 mmol/L (ref 22–32)
Calcium: 9 mg/dL (ref 8.9–10.3)
Chloride: 106 mmol/L (ref 101–111)
Creatinine, Ser: 0.7 mg/dL (ref 0.44–1.00)
GFR calc Af Amer: >60 mL/min (ref >60)
GFR calc non Af Amer: >60 mL/min (ref >60)
Glucose, Bld: 79 mg/dL (ref 65–99)
Potassium: 3.4 mmol/L — ABNORMAL LOW (ref 3.5–5.1)
Sodium: 139 mmol/L (ref 135–145)

## 2014-10-06 LAB — CBC WITH DIFFERENTIAL/PLATELET
Basophils Absolute: 0 10*3/uL (ref 0.0–0.1)
Basophils Relative: 0 % (ref 0–1)
Eosinophils Absolute: 0.1 10*3/uL (ref 0.0–0.7)
Eosinophils Relative: 1 % (ref 0–5)
HCT: 33.6 % — ABNORMAL LOW (ref 36.0–46.0)
Hemoglobin: 10.8 g/dL — ABNORMAL LOW (ref 12.0–15.0)
Lymphocytes Relative: 30 % (ref 12–46)
Lymphs Abs: 2 10*3/uL (ref 0.7–4.0)
MCH: 28.3 pg (ref 26.0–34.0)
MCHC: 32.1 g/dL (ref 30.0–36.0)
MCV: 88 fL (ref 78.0–100.0)
Monocytes Absolute: 0.8 10*3/uL (ref 0.1–1.0)
Monocytes Relative: 12 % (ref 3–12)
Neutro Abs: 3.8 10*3/uL (ref 1.7–7.7)
Neutrophils Relative %: 57 % (ref 43–77)
Platelets: 395 10*3/uL (ref 150–400)
RBC: 3.82 MIL/uL — ABNORMAL LOW (ref 3.87–5.11)
RDW: 14.3 % (ref 11.5–15.5)
WBC: 6.6 10*3/uL (ref 4.0–10.5)

## 2014-10-06 MED ORDER — SODIUM CHLORIDE 0.9 % IV BOLUS (SEPSIS)
1000.0000 mL | Freq: Once | INTRAVENOUS | Status: AC
  Administered 2014-10-06: 1000 mL via INTRAVENOUS

## 2014-10-06 MED ORDER — MORPHINE SULFATE 4 MG/ML IJ SOLN
4.0000 mg | Freq: Once | INTRAMUSCULAR | Status: AC
  Administered 2014-10-06: 4 mg via INTRAVENOUS
  Filled 2014-10-06: qty 1

## 2014-10-06 MED ORDER — ONDANSETRON HCL 4 MG/2ML IJ SOLN
4.0000 mg | Freq: Once | INTRAMUSCULAR | Status: AC
  Administered 2014-10-06: 4 mg via INTRAVENOUS
  Filled 2014-10-06: qty 2

## 2014-10-06 MED ORDER — OXYCODONE-ACETAMINOPHEN 5-325 MG PO TABS
2.0000 | ORAL_TABLET | Freq: Once | ORAL | Status: AC
  Administered 2014-10-06: 2 via ORAL
  Filled 2014-10-06: qty 2

## 2014-10-06 NOTE — Discharge Instructions (Signed)
Read the information below.  You may return to the Emergency Department at any time for worsening condition or any new symptoms that concern you.  If you develop high fevers, worsening abdominal pain, uncontrolled vomiting, or are unable to tolerate fluids by mouth, return to the ER for a recheck.    Pain Relief Preoperatively and Postoperatively Being a good patient does not mean being a silent one.If you have questions, problems, or concerns about the pain you may feel after surgery, let your caregiver know.Patients have the right to assessment and management of pain. The treatment of pain after surgery is important to speed up recovery and return to normal activities. Severe pain after surgery, and the fear or anxiety associated with that pain, may cause extreme discomfort that:  Prevents sleep.  Decreases the ability to breathe deeply and cough. This can cause pneumonia or other upper airway infections.  Causes your heart to beat faster and your blood pressure to be higher.  Increases the risk for constipation and bloating.  Decreases the ability of wounds to heal.  May result in depression, increased anxiety, and feelings of helplessness. Relief of pain before surgery is also important because it will lessen the pain after surgery. Patients who receive both pain relief before and after surgery experience greater pain relief than those who only receive pain relief after surgery. Let your caregiver know if you are having uncontrolled pain.This is very important.Pain after surgery is more difficult to manage if it is permitted to become severe, so prompt and adequate treatment of acute pain is necessary. PAIN CONTROL METHODS Your caregivers follow policies and procedures about the management of patient pain.These guidelines should be explained to you before surgery.Plans for pain control after surgery must be mutually decided upon and instituted with your full understanding and  agreement.Do not be afraid to ask questions regarding the care you are receiving.There are many different ways your caregivers will attempt to control your pain, including the following methods. As needed pain control  You may be given pain medicine either through your intravenous (IV) tube, or as a pill or liquid you can swallow. You will need to let your caregiver know when you are having pain. Then, your caregiver will give you the pain medicine ordered for you.  Your pain medicine may make you constipated. If constipation occurs, drink more liquids if you can. Your caregiver may have you take a mild laxative. IV patient-controlled analgesia pump (PCA pump)  You can get your pain medicine through the IV tube which goes into your vein. You are able to control the amount of pain medicine that you get. The pain medicine flows in through an IV tube and is controlled by a pump. This pump gives you a set amount of pain medicine when you push the button hooked up to it. Nobody should push this button but you or someone specifically assigned by you to do so. It is set up to keep you from accidentally giving yourself too much pain medicine. You will be able to start using your pain pump in the recovery room after your surgery. This method can be helpful for most types of surgery.  If you are still having too much pain, tell your caregiver. Also, tell your caregiver if you are feeling too sleepy or nauseous. Continuous epidural pain control  A thin, soft tube (catheter) is put into your back. Pain medicine flows through the catheter to lessen pain in the part of your body where the  surgery is done. Continuous epidural pain control may work best for you if you are having surgery on your chest, abdomen, hip area, or legs. The epidural catheter is usually put into your back just before surgery. The catheter is left in until you can eat and take medicine by mouth. In most cases, this may take 2 to 3  days.  Giving pain medicine through the epidural catheter may help you heal faster because:  Your bowel gets back to normal faster.  You can get back to eating sooner.  You can be up and walking sooner. Medicine that numbs the area (local anesthetic)  You may receive an injection of pain medicine near where the pain is (local infiltration).  You may receive an injection of pain medicine near the nerve that controls the sensation to a specific part of the body (peripheral nerve block).  Medicine may be put in the spine to block pain (spinal block). Opioids  Moderate to moderately severe acute pain after surgery may respond to opioids.Opioids are narcotic pain medicine. Opioids are often combined with non-narcotic medicines to improve pain relief, diminish the risk of side effects, and reduce the chance of addiction.  If you follow your caregiver's directions about taking opioids and you do not have a history of substance abuse, your risk of becoming addicted is exceptionally small.Opioids are given for short periods of time in careful doses to prevent addiction. Other methods of pain control include:  Steroids.  Physical therapy.  Heat and cold therapy.  Compression, such as wrapping an elastic bandage around the area of pain.  Massage. These various ways of controlling pain may be used together. Combining different methods of pain control is called multimodal analgesia. Using this approach has many benefits, including being able to eat, move around, and leave the hospital sooner. Document Released: 05/02/2002 Document Revised: 05/04/2011 Document Reviewed: 05/06/2010 [facility] Patient Information 2015 [place], [place]. This information is not intended to replace advice given to you by your health care provider. Make sure you discuss any questions you have with your health care provider.

## 2014-10-06 NOTE — Telephone Encounter (Signed)
Patient called stating purulence from her drain and from around the drain site.  Recommended flushing drain.  Pt instructed to be evaluated in ED if she began to feel worse or had fevers.

## 2014-10-06 NOTE — ED Provider Notes (Signed)
CSN: [id number]     Arrival date & time 10/06/14  1207 History   First MD Initiated Contact with Patient 10/06/14 1258     Chief Complaint  Patient presents with  . Post-op Problem     (Consider location/radiation/quality/duration/timing/severity/associated sxs/prior Treatment) HPI   Pt s/p gastric sleeve with recent abdominal wall seroma, incisional hernia with recent IR drain x 2 and laparoscopic drainage of seroma by Dr Redmond Pulling.  She has had I&D of abdominal wall "cyst" by Dr Barry Dienes and placed on bactrim.  States she developed increased pain overnight and feels that she needs IV antibiotics.  States she found pus on the gauze that was in the I&D space.  The pain is in her low back and radiates through her abdomen, is sharp in nature, and states this is what happens when her "infection acts up."  Associated weakness, decreased PO intact, occasional nausea.  Denies fevers, vomiting, bowel, urinary, or vaginal symptoms.    Past Medical History  Diagnosis Date  . GERD (gastroesophageal reflux disease)   . Diverticulitis 2008    perforated/ requiring resection  . Colostomy in place   . Bipolar affective   . Depression   . Anxiety   . Asthma   . Arthritis   . Blood transfusion without reported diagnosis   . Substance abuse     12 years ago-crack cocaine  . Family history of adverse reaction to anesthesia     MGM was placed on vent after surgery  . Sleep apnea     mild, does not use c-pap machine  . Shortness of breath dyspnea   . Headache   . Anemia    Past Surgical History  Procedure Laterality Date  . Exploratory laparotomy with resection  2008  . Colostomy reversal  11/2006  . Abdominal wall hernia  07/2007    open repair with lysis of adhesions  . Hx of abd wall seroma  08/2007    Drained via [place] in [place], [place]  . Hx of abd wall seroma  10/2007    Drained by Dr. Geroge Baseman in office  . Tonsillectomy    . Kidney stones    . Hernia repair    . Examination under anesthesia  N/A 05/11/2012    Procedure: EXAM UNDER ANESTHESIA;  Surgeon: Donato Heinz, MD;  Location: [facility];  Service: General;  Laterality: N/A;  . Placement of seton N/A 05/11/2012    Procedure: PLACEMENT OF SETON;  Surgeon: Donato Heinz, MD;  Location: [facility];  Service: General;  Laterality: N/A;  . Treatment fistula anal    . Finger closed reduction Right 08/05/2012    Procedure: CLOSED REDUCTION RIGHT THUMB (FINGER);  Surgeon: Linna Hoff, MD;  Location: [facility];  Service: Orthopedics;  Laterality: Right;  . Esophagogastroduodenoscopy  03/13/2010    [id number] esophagus, status post passage of a Maloney dilator/Small hiatal hernia/ Antral erosions, status post biopsy  . Colonoscopy  03/13/2010    [id number] hemorrhoids likely cause of hematochezia, otherwise normal  . Laparoscopic gastric sleeve resection N/A 02/13/2014    Procedure: LAPAROSCOPIC GASTRIC SLEEVE RESECTION LYSIS OF ADHESIONS, UPPER ENDOSCOPY;  Surgeon: [name], MD;  Location: [facility];  Service: General;  Laterality: N/A;  . Root canal 7-16    . Laparoscopy N/A 09/28/2014    Procedure: LAPAROSCOPY DIAGNOSTIC, INCISION AND DRAINAGE WITH LAPAROSCOPIC EXPLORATION OF ABDOMINAL WALL SEROMA with ultrasound;  Surgeon: Greer Pickerel, MD;  Location: [facility];  Service: General;  Laterality: N/A;  Family History  Problem Relation Age of Onset  . Asthma Maternal Grandmother    Social History  Substance Use Topics  . Smoking status: Former Smoker -- 0.50 packs/day for 14 years    Types: E-cigarettes    Quit date: 12/20/2013  . Smokeless tobacco: Never Used  . Alcohol Use: No   OB History    No data available     Review of Systems  All other systems reviewed and are negative.     Allergies  Review of patient's allergies indicates no known allergies.  Home Medications   Prior to Admission medications   Medication Sig Start Date End Date Taking? Authorizing Provider  Calcium Carbonate-Vitamin D (CALCIUM + D PO)  Take 1 tablet by mouth daily.   Yes Historical Provider, MD  clonazePAM (KLONOPIN) 1 MG tablet Take 1 mg by mouth 2 (two) times daily.    Yes Historical Provider, MD  esomeprazole (NEXIUM) 40 MG capsule Take 40 mg by mouth daily before lunch.   Yes Historical Provider, MD  ferrous sulfate 325 (65 FE) MG tablet Take 650 mg by mouth 2 (two) times daily.    Yes Historical Provider, MD  ibuprofen (ADVIL,MOTRIN) 200 MG tablet Take 800 mg by mouth every 6 (six) hours as needed for moderate pain.   Yes Historical Provider, MD  lisdexamfetamine (VYVANSE) 40 MG capsule Take 40 mg by mouth daily.    Yes Historical Provider, MD  Multiple Vitamin (MULTIVITAMIN WITH MINERALS) TABS tablet Take 1 tablet by mouth 2 (two) times daily.   Yes Historical Provider, MD  Multiple Vitamins-Minerals (HAIR/SKIN/NAILS/BIOTIN) TABS Take 1 tablet by mouth 2 (two) times daily.   Yes Historical Provider, MD  ondansetron (ZOFRAN ODT) 4 MG disintegrating tablet Take 1 tablet (4 mg total) by mouth every 8 (eight) hours as needed for nausea or vomiting. 09/28/14  Yes Greer Pickerel, MD  oxyCODONE (ROXICODONE) 5 MG immediate release tablet Take 1 tablet (5 mg total) by mouth every 4 (four) hours as needed for severe pain. Patient taking differently: Take 10-15 mg by mouth every 4 (four) hours as needed for severe pain.  10/01/14  Yes Christopher Lawyer, PA-C  promethazine (PHENERGAN) 12.5 MG tablet Take 1-2 tablets (12.5-25 mg total) by mouth every 8 (eight) hours as needed for nausea. Patient taking differently: Take 12.5-25 mg by mouth every 6 (six) hours as needed for nausea or vomiting.  02/16/14  Yes Alphonsa Overall, MD  QUEtiapine (SEROQUEL) 400 MG tablet Take 400 mg by mouth at bedtime.    Yes Historical Provider, MD  sulfamethoxazole-trimethoprim (BACTRIM DS,SEPTRA DS) 800-160 MG per tablet Take 1 tablet by mouth 2 (two) times daily. 10/03/14 10/10/14 Yes Historical Provider, MD  valACYclovir (VALTREX) 500 MG tablet Take 500 mg by mouth  daily as needed (fever blister).  03/01/13  Yes Historical Provider, MD  zolpidem (AMBIEN) 10 MG tablet Take 10 mg by mouth at bedtime.    Yes Historical Provider, MD  PERCOCET 5-325 MG per tablet Take 1 tablet by mouth every 6 (six) hours as needed for severe pain. Patient not taking: Reported on 10/06/2014 10/01/14   Dalia Heading, PA-C  traMADol (ULTRAM) 50 MG tablet Take 1 tablet (50 mg total) by mouth every 6 (six) hours as needed. Patient not taking: Reported on 10/06/2014 10/01/14   Dalia Heading, PA-C   BP 149/101 mmHg  Pulse 101  Temp(Src) 97.7 F (36.5 C) (Oral)  Resp 18  SpO2 98% Physical Exam  Constitutional: She appears well-developed and well-nourished.  No distress.  HENT:  Head: Normocephalic and atraumatic.  Neck: Neck supple.  Cardiovascular: Normal rate and regular rhythm.   Pulmonary/Chest: Effort normal and breath sounds normal. No respiratory distress. She has no wheezes. She has no rales.  Abdominal: Soft. She exhibits no distension. There is no tenderness. There is no rebound and no guarding.  Obese.  JP drain in place draining serous fluid.  Inicision is open without discharge.  No erythema, edema, warmth.  Tenderness inferior to JP drain where there is a small bruise.    Neurological: She is alert.  Skin: She is not diaphoretic.  Nursing note and vitals reviewed.   ED Course  Procedures (including critical care time) Labs Review Labs Reviewed  BASIC METABOLIC PANEL - Abnormal; Notable for the following:    Potassium 3.4 (*)    All other components within normal limits  CBC WITH DIFFERENTIAL/PLATELET - Abnormal; Notable for the following:    RBC 3.82 (*)    Hemoglobin 10.8 (*)    HCT 33.6 (*)    All other components within normal limits    Imaging Review No results found. I, Kally Cadden, personally reviewed and evaluated these images and lab results as part of my medical decision-making.   EKG Interpretation None       2:05 PM I discussed pt  with Dr Zenia Resides who will also see and examine the patient.    3:05 PM Discussed pt with Dr Leighton Ruff (general surgery) who came to ED to see pt.  She agrees that patient needs nothing from a surgical standpoint and she feels that the incisions and drain look fine.    3:14 PM Pt requesting additional prescription for pain medication - she has a bottle with #9 oxycodone 5mg  in it and states she has another prescription written by Dr Barry Dienes that she will be able to fill on Monday.  The day after tomorrow.  I have advised her that I will give her medication in the ED but that she has enough medication to make it to Monday and can fill her prescription then.     MDM   Final diagnoses:  Postoperative pain    Afebrile, nontoxic patient with postoperative abdominal pain and concern for infection.  The incisions to do not appear infected.  She is slurring her words and asking for pain medication.  I did treat her pain here and have recommended that she follow up with her surgeon for pain management - please see note above regarding my conversation with her about this.  I spoke with Dr Marcello Moores, general surgery, who saw pt in ED and agrees with assessment.  D/C home with general surgery follow up.  Discussed result, findings, treatment, and follow up  with patient.  Pt given return precautions.  Pt verbalizes understanding and agrees with plan.         Clayton Bibles, PA-C 10/06/14 [place], MD 10/07/14 [phone]

## 2014-10-06 NOTE — ED Notes (Addendum)
Pt complains of abdominal swelling and pain that became worse last night. Pt has a JP drain from a recent surgery on 8/5. Pt states she feels like she has an infection and needs IV antibiotics and is concerned her oral antibiotics are not working. Pt was told to come to ED if pain is worse. Pt took oxycodone, phenergan, and klonopin at 8AM today.

## 2014-10-06 NOTE — ED Notes (Signed)
MD at bedside. 

## 2014-10-08 ENCOUNTER — Telehealth: Payer: Self-pay | Admitting: General Surgery

## 2014-10-08 DIAGNOSIS — E538 Deficiency of other specified B group vitamins: Secondary | ICD-10-CM | POA: Diagnosis not present

## 2014-10-08 NOTE — Telephone Encounter (Signed)
Phone call message documentation for conversation with pt from earlier today  From: Aleatha Borer, LPN   Sent: 06/11/3788  1:02 PM    To: Greer Pickerel, MD  Subject: RE: Refill on pain medication           Spoke with Dr. Barry Dienes, who agreed to sign Rx. Do you want me to hold off on referral until you see her on 8/17? Please advise   Thanks  Ammie  ----- Message -----   From: Greer Pickerel, MD   Sent: 10/08/2014 12:45 PM    To: Ammie Eversole, LPN  Subject: RE: Refill on pain medication           Prolonged conversation with pt. No fever. No vomiting. Having Bms. Eating and drinking. Having pain at seroma site. Radiates to sides.  She is taking 3 oxy every 4.5 hrs.   Told her this will be last rx until I see her in office. Also told pt she needs to space oxy to every 6 hrs. Also discussed her being referred to a pain management clinic   pls have someone sign off for Rx   Oxycodone 10mg  1 tab po q6hrs PRN; #40  Robaxin 500mg  1 tab po q8h PRN, #30   pls call pt when rx are ready   Thanks  Zamora Colton  ----- Message -----   From: Aleatha Borer, LPN   Sent: 2/40/9735 10:45 AM    To: Greer Pickerel, MD, Patsey Berthold  Subject: Refill on pain medication             This is very complex so here's the timeline according to [place], [place]:   8/1 Tramadol, qty 30, written by you. Pt filled in [place].  8/5 Oxycodone, qty 50, written by you. Pt filled in [place].  8/9 Oxy, qty 60, written by Dr. Barry Dienes. Pt filled in [place].  8/12 Pt presented to [facility] with Rx for Oxy, qty 24. Korrine began to question Rx and proceeded to inform pt that she could not refill Rx. Pt did not allow Korrine to finish her explanation before "snatching" Rx out of her hand. Korrine also could not tell me who the [place] physician was. Admitted she did not look thoroughly at Rx prior to pt "snatching" from her hand.  8/14 Oxy, qty 15, written by Dr. Cathi Roan. Pt filled in  [place]  8/14 Tramadol, qty 50, written by Dr. Cathi Roan. Pt filled in [place]  - Clonazepam, written by Dr. Johnn Hai has been placed on hold because it's too early to fill.   Pt hasn't received a Rx for 110 tabs by any one prescriber. These Rx's have been written by various prescribers, for various qty's and taken to various pharmacies. No additional information could be provided by Korrine. Website no longer working at the time of my call.   Thanks,  Ammie

## 2014-10-10 DIAGNOSIS — Z9884 Bariatric surgery status: Secondary | ICD-10-CM | POA: Diagnosis not present

## 2014-10-11 ENCOUNTER — Emergency Department (HOSPITAL_COMMUNITY)
Admission: EM | Admit: 2014-10-11 | Discharge: 2014-10-12 | Disposition: A | Discharge status: Home / Self Care | Payer: Medicare Other | Attending: Emergency Medicine | Admitting: Emergency Medicine

## 2014-10-11 ENCOUNTER — Encounter: Payer: Self-pay | Admitting: General Surgery

## 2014-10-11 ENCOUNTER — Encounter (HOSPITAL_COMMUNITY): Payer: Self-pay | Admitting: Emergency Medicine

## 2014-10-11 DIAGNOSIS — F319 Bipolar disorder, unspecified: Secondary | ICD-10-CM | POA: Diagnosis not present

## 2014-10-11 DIAGNOSIS — G8929 Other chronic pain: Secondary | ICD-10-CM | POA: Insufficient documentation

## 2014-10-11 DIAGNOSIS — J45909 Unspecified asthma, uncomplicated: Secondary | ICD-10-CM | POA: Diagnosis not present

## 2014-10-11 DIAGNOSIS — R109 Unspecified abdominal pain: Secondary | ICD-10-CM | POA: Diagnosis not present

## 2014-10-11 DIAGNOSIS — Z79899 Other long term (current) drug therapy: Secondary | ICD-10-CM | POA: Diagnosis not present

## 2014-10-11 DIAGNOSIS — Z933 Colostomy status: Secondary | ICD-10-CM | POA: Diagnosis not present

## 2014-10-11 DIAGNOSIS — M1990 Unspecified osteoarthritis, unspecified site: Secondary | ICD-10-CM | POA: Diagnosis not present

## 2014-10-11 DIAGNOSIS — R110 Nausea: Secondary | ICD-10-CM | POA: Diagnosis not present

## 2014-10-11 DIAGNOSIS — M545 Low back pain: Secondary | ICD-10-CM | POA: Diagnosis not present

## 2014-10-11 DIAGNOSIS — K219 Gastro-esophageal reflux disease without esophagitis: Secondary | ICD-10-CM | POA: Diagnosis not present

## 2014-10-11 DIAGNOSIS — Z9889 Other specified postprocedural states: Secondary | ICD-10-CM | POA: Diagnosis not present

## 2014-10-11 DIAGNOSIS — F419 Anxiety disorder, unspecified: Secondary | ICD-10-CM | POA: Insufficient documentation

## 2014-10-11 DIAGNOSIS — D649 Anemia, unspecified: Secondary | ICD-10-CM | POA: Insufficient documentation

## 2014-10-11 DIAGNOSIS — Z87891 Personal history of nicotine dependence: Secondary | ICD-10-CM | POA: Diagnosis not present

## 2014-10-11 DIAGNOSIS — R1084 Generalized abdominal pain: Secondary | ICD-10-CM | POA: Diagnosis not present

## 2014-10-11 MED ORDER — KETOROLAC TROMETHAMINE 60 MG/2ML IM SOLN
30.0000 mg | Freq: Once | INTRAMUSCULAR | Status: AC
Start: 2014-10-11 — End: 2014-10-11
  Administered 2014-10-11: 30 mg via INTRAMUSCULAR
  Filled 2014-10-11: qty 2

## 2014-10-11 NOTE — ED Provider Notes (Signed)
CSN: [id number]     Arrival date & time 10/11/14  2230 History   First MD Initiated Contact with Patient 10/11/14 2301     Chief Complaint  Patient presents with  . Abdominal Pain  . Back Pain   Melissa MARTINEZGARCIA is a 39 y.o. female with a history of GERD, diverticulitis, bipolar disorder, depression, anxiety, status post gastric sleeve and incisional hernia with recent IR drain of seroma by [name] who presents to the ED reporting that when her pain medication wears off she has bad abdominal pain. She reports having plenty of pain medication at home and states "I can get as much as I want." She reports this abdominal pain is the same for the past many years and has not changed recently. She reports that when taking her pain medication she feels better, but does not like that she hurts when it wears off. She requests an "MRI and a hospital stay to figure this all out." She reports her last surgery was Aug 5 by [name]. She has a JP drain in place and reports her drainage has been unchanged. She reports seeing [name] yesterday and having blood work done. She reports she is upset because she does not know the results yet. She also reports that she has chronic right low back pain ongoing for the past several years and unchanged. She reports taking muscle relaxers, and oxycodone today with relief of her pain, that returns when the medication wears off. She reports having nausea when her pain is bad, but no vomiting. She currently rates her abdominal pain at 10/10 and generalized to her abdomen. Her last bowel movement was today. The patient denies fevers, chills, vomiting, urinary symptoms, changes to her bowel habits, numbness, tingling, weakness, loss of bladder control, loss of bowel control, difficulty urinating, lightheadedness, dizziness, headaches, chest pain, shortness of breath, cough, wheezing, or rashes.  (Consider location/radiation/quality/duration/timing/severity/associated  sxs/prior Treatment) HPI  Past Medical History  Diagnosis Date  . GERD (gastroesophageal reflux disease)   . Diverticulitis 2008    perforated/ requiring resection  . Colostomy in place   . Bipolar affective   . Depression   . Anxiety   . Asthma   . Arthritis   . Blood transfusion without reported diagnosis   . Substance abuse     12 years ago-crack cocaine  . Family history of adverse reaction to anesthesia     MGM was placed on vent after surgery  . Sleep apnea     mild, does not use c-pap machine  . Shortness of breath dyspnea   . Headache   . Anemia    Past Surgical History  Procedure Laterality Date  . Exploratory laparotomy with resection  2008  . Colostomy reversal  11/2006  . Abdominal wall hernia  07/2007    open repair with lysis of adhesions  . Hx of abd wall seroma  08/2007    Drained via [place] in [place], [place]  . Hx of abd wall seroma  10/2007    Drained by Dr. Geroge Baseman in office  . Tonsillectomy    . Kidney stones    . Hernia repair    . Examination under anesthesia N/A 05/11/2012    Procedure: EXAM UNDER ANESTHESIA;  Surgeon: Donato Heinz, MD;  Location: [facility];  Service: General;  Laterality: N/A;  . Placement of seton N/A 05/11/2012    Procedure: PLACEMENT OF SETON;  Surgeon: Donato Heinz, MD;  Location: [facility];  Service:  General;  Laterality: N/A;  . Treatment fistula anal    . Finger closed reduction Right 08/05/2012    Procedure: CLOSED REDUCTION RIGHT THUMB (FINGER);  Surgeon: Linna Hoff, MD;  Location: [facility];  Service: Orthopedics;  Laterality: Right;  . Esophagogastroduodenoscopy  03/13/2010    YKD:[id number] esophagus, status post passage of a Maloney dilator/Small hiatal hernia/ Antral erosions, status post biopsy  . Colonoscopy  03/13/2010    ALP:FXTKWIOX hemorrhoids likely cause of hematochezia, otherwise normal  . Laparoscopic gastric sleeve resection N/A 02/13/2014    Procedure: LAPAROSCOPIC GASTRIC SLEEVE RESECTION LYSIS OF  ADHESIONS, UPPER ENDOSCOPY;  Surgeon: [name], MD;  Location: [facility];  Service: General;  Laterality: N/A;  . Root canal 7-16    . Laparoscopy N/A 09/28/2014    Procedure: LAPAROSCOPY DIAGNOSTIC, INCISION AND DRAINAGE WITH LAPAROSCOPIC EXPLORATION OF ABDOMINAL WALL SEROMA with ultrasound;  Surgeon: Greer Pickerel, MD;  Location: [facility];  Service: General;  Laterality: N/A;   Family History  Problem Relation Age of Onset  . Asthma Maternal Grandmother    Social History  Substance Use Topics  . Smoking status: Former Smoker -- 0.50 packs/day for 14 years    Types: E-cigarettes    Quit date: 12/20/2013  . Smokeless tobacco: Never Used  . Alcohol Use: No   OB History    No data available     Review of Systems  Constitutional: Negative for fever and chills.  HENT: Negative for congestion and sore throat.   Eyes: Negative for visual disturbance.  Respiratory: Negative for cough, shortness of breath and wheezing.   Cardiovascular: Negative for chest pain and palpitations.  Gastrointestinal: Positive for nausea and abdominal pain. Negative for vomiting, diarrhea, blood in stool and abdominal distention.  Genitourinary: Negative for dysuria, urgency, frequency, hematuria, flank pain, decreased urine volume, vaginal bleeding, vaginal discharge and difficulty urinating.  Musculoskeletal: Positive for back pain. Negative for gait problem and neck pain.  Skin: Negative for rash.  Neurological: Negative for dizziness, syncope, weakness, light-headedness, numbness and headaches.      Allergies  Review of patient's allergies indicates no known allergies.  Home Medications   Prior to Admission medications   Medication Sig Start Date End Date Taking? Authorizing Provider  Calcium Carbonate-Vitamin D (CALCIUM + D PO) Take 1 tablet by mouth daily.    Historical Provider, MD  clonazePAM (KLONOPIN) 1 MG tablet Take 1 mg by mouth 2 (two) times daily.     Historical Provider, MD  esomeprazole  (NEXIUM) 40 MG capsule Take 40 mg by mouth daily before lunch.    Historical Provider, MD  ferrous sulfate 325 (65 FE) MG tablet Take 650 mg by mouth 2 (two) times daily.     Historical Provider, MD  ibuprofen (ADVIL,MOTRIN) 200 MG tablet Take 800 mg by mouth every 6 (six) hours as needed for moderate pain.    Historical Provider, MD  lisdexamfetamine (VYVANSE) 40 MG capsule Take 40 mg by mouth daily.     Historical Provider, MD  Multiple Vitamin (MULTIVITAMIN WITH MINERALS) TABS tablet Take 1 tablet by mouth 2 (two) times daily.    Historical Provider, MD  Multiple Vitamins-Minerals (HAIR/SKIN/NAILS/BIOTIN) TABS Take 1 tablet by mouth 2 (two) times daily.    Historical Provider, MD  ondansetron (ZOFRAN ODT) 4 MG disintegrating tablet Take 1 tablet (4 mg total) by mouth every 8 (eight) hours as needed for nausea or vomiting. 09/28/14   Greer Pickerel, MD  oxyCODONE (ROXICODONE) 5 MG immediate release tablet Take  1 tablet (5 mg total) by mouth every 4 (four) hours as needed for severe pain. Patient taking differently: Take 10-15 mg by mouth every 4 (four) hours as needed for severe pain.  10/01/14   Christopher Lawyer, PA-C  PERCOCET 5-325 MG per tablet Take 1 tablet by mouth every 6 (six) hours as needed for severe pain. Patient not taking: Reported on 10/06/2014 10/01/14   Dalia Heading, PA-C  promethazine (PHENERGAN) 12.5 MG tablet Take 1-2 tablets (12.5-25 mg total) by mouth every 8 (eight) hours as needed for nausea. Patient taking differently: Take 12.5-25 mg by mouth every 6 (six) hours as needed for nausea or vomiting.  02/16/14   Alphonsa Overall, MD  QUEtiapine (SEROQUEL) 400 MG tablet Take 400 mg by mouth at bedtime.     Historical Provider, MD  traMADol (ULTRAM) 50 MG tablet Take 1 tablet (50 mg total) by mouth every 6 (six) hours as needed. Patient not taking: Reported on 10/06/2014 10/01/14   Dalia Heading, PA-C  valACYclovir (VALTREX) 500 MG tablet Take 500 mg by mouth daily as needed (fever  blister).  03/01/13   Historical Provider, MD  zolpidem (AMBIEN) 10 MG tablet Take 10 mg by mouth at bedtime.     Historical Provider, MD   BP 132/89 mmHg  Pulse 95  Temp(Src) 97.7 F (36.5 C) (Oral)  Resp 14  SpO2 99% Physical Exam  Constitutional: She is oriented to person, place, and time. She appears well-developed and well-nourished. No distress.  Nontoxic appearing.  HENT:  Head: Normocephalic and atraumatic.  Mouth/Throat: Oropharynx is clear and moist.  Eyes: Conjunctivae are normal. Pupils are equal, round, and reactive to light. Right eye exhibits no discharge. Left eye exhibits no discharge.  Neck: Neck supple. No JVD present. No tracheal deviation present.  Cardiovascular: Normal rate, regular rhythm, normal heart sounds and intact distal pulses.  Exam reveals no gallop and no friction rub.   No murmur heard. HR is 96.   Pulmonary/Chest: Effort normal and breath sounds normal. No respiratory distress. She has no wheezes. She has no rales.  Lungs are clear to auscultation bilaterally.  Abdominal: Soft. Bowel sounds are normal. She exhibits no distension and no mass. There is no tenderness. There is no rebound and no guarding.  Abdomen is soft and bowel sounds are present. Patient is distractible and abdomen is non-tender to palpation. JP drain is in place and draining serous fluid. No abdominal erythema, warmth or edema noted. No CVA tenderness.   Musculoskeletal: She exhibits tenderness. She exhibits no edema.  Patient has mild right low back tenderness to palpation. No midline back tenderness. No back deformity, erythema, edema or warmth. The patient is able to ambulate without difficulty or assistance. She has normal gait. She has 5 out of 5 strength to bilateral upper and lower extremities.  Lymphadenopathy:    She has no cervical adenopathy.  Neurological: She is alert and oriented to person, place, and time. Coordination normal.  Patient is alert and oriented 3. Her  sensation is intact in her bilateral upper and lower extremities. Her speech is clear and coherent. Sensation is intact to her bilateral upper extremities. She is able to ambulate with normal gait.  Skin: Skin is warm and dry. No rash noted. She is not diaphoretic. No erythema. No pallor.  Psychiatric: She has a normal mood and affect. Her behavior is normal.  Bizarre affect.  Nursing note and vitals reviewed.   ED Course  Procedures (including critical care time) Labs Review  Labs Reviewed - No data to display  Imaging Review No results found.    EKG Interpretation None      Filed Vitals:   10/11/14 2237 10/11/14 2349  BP: 160/90 132/89  Pulse: 126 95  Temp: 97.7 F (36.5 C)   TempSrc: Oral   Resp: 22 14  SpO2: 98% 99%     MDM   Meds given in ED:  Medications  ketorolac (TORADOL) injection 30 mg (30 mg Intramuscular Given 10/11/14 2349)    New Prescriptions   No medications on file    Final diagnoses:  Chronic abdominal pain  Right low back pain, with sciatica presence unspecified   This is a 40 y.o. female with a history of GERD, diverticulitis, bipolar disorder, depression, anxiety, status post gastric sleeve and incisional hernia with recent IR drain of seroma by [name] who presents to the ED reporting that when her pain medication wears off she has bad abdominal pain. She reports having plenty of pain medication at home and states "I can get as much as I want." She reports this abdominal pain is the same for the past many years and has not changed recently. She reports that when taking her pain medication she feels better, but does not like that she hurts when it wears off. She requests an "MRI and a hospital stay to figure this all out." She reports her last surgery was Aug 5 by [name]. She has a JP drain in place and reports her drainage has been unchanged. She reports seeing [name] yesterday and having blood work done. She reports she is upset because  she does not know the results yet. She also reports that she has chronic right low back pain ongoing for the past several years and unchanged. She reports taking muscle relaxers, and oxycodone today with relief of her pain, that returns when the medication wears off. After explaining that I would not be able to do an MRI of the patient started complaining of her right low back pain that has been ongoing for years. She denies any trauma or injury to her back. On the [place] controlled substance database the patient received 40 pills of oxycodone 10 mg tablets 3 days ago, 10 tablets of tramadol 50 mg. She also received 15 tablets of oxycodone 5 mg 4 days ago. She also received 60 tablets of oxycodone 5 mg 8 days ago. She reports she has medication at home. On exam the patient is afebrile nontoxic appearing. She has no focal neurological deficits. She is able to handle it with normal gait. Her abdomen is soft and nontender to palpation with a JP drain in place draining serous fluid. There is no erythema, warmth or erythema noted or abdomen. According to the nurse after I saw the patient she emptied one of her pill bottles in her purse and told him she was out of this pain medication and if I could give her more of it. I explained that I could not give her any narcotic pain medications in the ED and she would need to see her regular providers for this. I do not feel there is an emergent medical condition at this time and she does not need MRI in the ED.  Will provide the patient with Toradol in the ED, as she is driving, and have her follow-up with PCP and general surgery. I have reviewed records of multiple ED visits for similar or other pain-related complaints. I feel that the patient's pain  is chronic and cannot be appropriately or safely treated in emergency department setting. I do not feel that providing a prescription for narcotic pain medicine is in this patient's best interest. She has multiple  prescriptions for narcotic pain medicines at home. She also reports having plenty of pain medicine at home. I have urged patient to follow-up closely with their primary care physician and general surgeon. I have explicitly discussed with the patient on precautions and have reassured him that they can always be seen and evaluated in the emergency department for any condition that they feel is emergent, and that they will be given treatment as the emergency physician feels appropriate and safe, but this may not involve the use of narcotic pain medication prescriptions. The patient was given the opportunity to voice any further questions or concerns and these were addressed to the best of my ability.  This patient was discussed with Dr. Claudine Mouton who agrees with assessment and plan.   Waynetta Pean, PA-C 10/12/14 0023  Everlene Balls, MD 10/12/14 [phone]

## 2014-10-11 NOTE — ED Notes (Signed)
Pt arrived to the ED with a complaint of abdominal and back pain.  Pt had surgery two weeks ago.  Pt states she has a drain in place.  Pt states abdominal pian is a sharp stabbing sensation. Pt states lower back pain is a crippling sensation.

## 2014-10-11 NOTE — ED Notes (Addendum)
Dr. Greer Pickerel prescribed pain medications for patient; pt c/o pain after pain medications wear off.

## 2014-10-11 NOTE — Discharge Instructions (Signed)
Abdominal Pain, Women °Abdominal (stomach, pelvic, or belly) pain can be caused by many things. It is important to tell your doctor: °· The location of the pain. °· Does it come and go or is it present all the time? °· Are there things that start the pain (eating certain foods, exercise)? °· Are there other symptoms associated with the pain (fever, nausea, vomiting, diarrhea)? °All of this is helpful to know when trying to find the cause of the pain. °CAUSES  °· Stomach: virus or bacteria infection, or ulcer. °· Intestine: appendicitis (inflamed appendix), regional ileitis (Crohn's disease), ulcerative colitis (inflamed colon), irritable bowel syndrome, diverticulitis (inflamed diverticulum of the colon), or cancer of the stomach or intestine. °· Gallbladder disease or stones in the gallbladder. °· Kidney disease, kidney stones, or infection. °· Pancreas infection or cancer. °· Fibromyalgia (pain disorder). °· Diseases of the female organs: °¨ Uterus: fibroid (non-cancerous) tumors or infection. °¨ Fallopian tubes: infection or tubal pregnancy. °¨ Ovary: cysts or tumors. °¨ Pelvic adhesions (scar tissue). °¨ Endometriosis (uterus lining tissue growing in the pelvis and on the pelvic organs). °¨ Pelvic congestion syndrome (female organs filling up with blood just before the menstrual period). °¨ Pain with the menstrual period. °¨ Pain with ovulation (producing an egg). °¨ Pain with an IUD (intrauterine device, birth control) in the uterus. °¨ Cancer of the female organs. °· Functional pain (pain not caused by a disease, may improve without treatment). °· Psychological pain. °· Depression. °DIAGNOSIS  °Your doctor will decide the seriousness of your pain by doing an examination. °· Blood tests. °· X-rays. °· Ultrasound. °· CT scan (computed tomography, special type of X-ray). °· MRI (magnetic resonance imaging). °· Cultures, for infection. °· Barium enema (dye inserted in the large intestine, to better view it with  X-rays). °· Colonoscopy (looking in intestine with a lighted tube). °· Laparoscopy (minor surgery, looking in abdomen with a lighted tube). °· Major abdominal exploratory surgery (looking in abdomen with a large incision). °TREATMENT  °The treatment will depend on the cause of the pain.  °· Many cases can be observed and treated at home. °· Over-the-counter medicines recommended by your caregiver. °· Prescription medicine. °· Antibiotics, for infection. °· Birth control pills, for painful periods or for ovulation pain. °· Hormone treatment, for endometriosis. °· Nerve blocking injections. °· Physical therapy. °· Antidepressants. °· Counseling with a psychologist or psychiatrist. °· Minor or major surgery. °HOME CARE INSTRUCTIONS  °· Do not take laxatives, unless directed by your caregiver. °· Take over-the-counter pain medicine only if ordered by your caregiver. Do not take aspirin because it can cause an upset stomach or bleeding. °· Try a clear liquid diet (broth or water) as ordered by your caregiver. Slowly move to a bland diet, as tolerated, if the pain is related to the stomach or intestine. °· Have a thermometer and take your temperature several times a day, and record it. °· Bed rest and sleep, if it helps the pain. °· Avoid sexual intercourse, if it causes pain. °· Avoid stressful situations. °· Keep your follow-up appointments and tests, as your caregiver orders. °· If the pain does not go away with medicine or surgery, you may try: °¨ Acupuncture. °¨ Relaxation exercises (yoga, meditation). °¨ Group therapy. °¨ Counseling. °SEEK MEDICAL CARE IF:  °· You notice certain foods cause stomach pain. °· Your home care treatment is not helping your pain. °· You need stronger pain medicine. °· You want your IUD removed. °· You feel faint or   lightheaded.  You develop nausea and vomiting.  You develop a rash.  You are having side effects or an allergy to your medicine. SEEK IMMEDIATE MEDICAL CARE IF:   Your  pain does not go away or gets worse.  You have a fever.  Your pain is felt only in portions of the abdomen. The right side could possibly be appendicitis. The left lower portion of the abdomen could be colitis or diverticulitis.  You are passing blood in your stools (bright red or black tarry stools, with or without vomiting).  You have blood in your urine.  You develop chills, with or without a fever.  You pass out. MAKE SURE YOU:   Understand these instructions.  Will watch your condition.  Will get help right away if you are not doing well or get worse. Document Released: 12/07/2006 Document Revised: 06/26/2013 Document Reviewed: 12/27/2008 [facility] Patient Information 2015 [place], [place]. This information is not intended to replace advice given to you by your health care provider. Make sure you discuss any questions you have with your health care provider. Back Exercises Back exercises help treat and prevent back injuries. The goal of back exercises is to increase the strength of your abdominal and back muscles and the flexibility of your back. These exercises should be started when you no longer have back pain. Back exercises include:  Pelvic Tilt. Lie on your back with your knees bent. Tilt your pelvis until the lower part of your back is against the floor. Hold this position 5 to 10 sec and repeat 5 to 10 times.  Knee to Chest. Pull first 1 knee up against your chest and hold for 20 to 30 seconds, repeat this with the other knee, and then both knees. This may be done with the other leg straight or bent, whichever feels better.  Sit-Ups or Curl-Ups. Bend your knees 90 degrees. Start with tilting your pelvis, and do a partial, slow sit-up, lifting your trunk only 30 to 45 degrees off the floor. Take at least 2 to 3 seconds for each sit-up. Do not do sit-ups with your knees out straight. If partial sit-ups are difficult, simply do the above but with only tightening your abdominal  muscles and holding it as directed.  Hip-Lift. Lie on your back with your knees flexed 90 degrees. Push down with your feet and shoulders as you raise your hips a couple inches off the floor; hold for 10 seconds, repeat 5 to 10 times.  Back arches. Lie on your stomach, propping yourself up on bent elbows. Slowly press on your hands, causing an arch in your low back. Repeat 3 to 5 times. Any initial stiffness and discomfort should lessen with repetition over time.  Shoulder-Lifts. Lie face down with arms beside your body. Keep hips and torso pressed to floor as you slowly lift your head and shoulders off the floor. Do not overdo your exercises, especially in the beginning. Exercises may cause you some mild back discomfort which lasts for a few minutes; however, if the pain is more severe, or lasts for more than 15 minutes, do not continue exercises until you see your caregiver. Improvement with exercise therapy for back problems is slow.  See your caregivers for assistance with developing a proper back exercise program. Document Released: 03/19/2004 Document Revised: 05/04/2011 Document Reviewed: 12/11/2010 [facility] Patient Information 2015 [place], [place]. This information is not intended to replace advice given to you by your health care provider. Make sure you discuss any questions you  have with your health care provider. Back Pain, Adult Low back pain is very common. About 1 in 5 people have back pain.The cause of low back pain is rarely dangerous. The pain often gets better over time.About half of people with a sudden onset of back pain feel better in just 2 weeks. About 8 in 10 people feel better by 6 weeks.  CAUSES Some common causes of back pain include:  Strain of the muscles or ligaments supporting the spine.  Wear and tear (degeneration) of the spinal discs.  Arthritis.  Direct injury to the back. DIAGNOSIS Most of the time, the direct cause of low back pain is not known.However,  back pain can be treated effectively even when the exact cause of the pain is unknown.Answering your caregiver's questions about your overall health and symptoms is one of the most accurate ways to make sure the cause of your pain is not dangerous. If your caregiver needs more information, he or she may order lab work or imaging tests (X-rays or MRIs).However, even if imaging tests show changes in your back, this usually does not require surgery. HOME CARE INSTRUCTIONS For many people, back pain returns.Since low back pain is rarely dangerous, it is often a condition that people can learn to [facility] their own.   Remain active. It is stressful on the back to sit or stand in one place. Do not sit, drive, or stand in one place for more than 30 minutes at a time. Take short walks on level surfaces as soon as pain allows.Try to increase the length of time you walk each day.  Do not stay in bed.Resting more than 1 or 2 days can delay your recovery.  Do not avoid exercise or work.Your body is made to move.It is not dangerous to be active, even though your back may hurt.Your back will likely heal faster if you return to being active before your pain is gone.  Pay attention to your body when you bend and lift. Many people have less discomfortwhen lifting if they bend their knees, keep the load close to their bodies,and avoid twisting. Often, the most comfortable positions are those that put less stress on your recovering back.  Find a comfortable position to sleep. Use a firm mattress and lie on your side with your knees slightly bent. If you lie on your back, put a pillow under your knees.  Only take over-the-counter or prescription medicines as directed by your caregiver. Over-the-counter medicines to reduce pain and inflammation are often the most helpful.Your caregiver may prescribe muscle relaxant drugs.These medicines help dull your pain so you can more quickly return to your normal  activities and healthy exercise.  Put ice on the injured area.  Put ice in a plastic bag.  Place a towel between your skin and the bag.  Leave the ice on for 15-20 minutes, 03-04 times a day for the first 2 to 3 days. After that, ice and heat may be alternated to reduce pain and spasms.  Ask your caregiver about trying back exercises and gentle massage. This may be of some benefit.  Avoid feeling anxious or stressed.Stress increases muscle tension and can worsen back pain.It is important to recognize when you are anxious or stressed and learn ways to manage it.Exercise is a great option. SEEK MEDICAL CARE IF:  You have pain that is not relieved with rest or medicine.  You have pain that does not improve in 1 week.  You have new symptoms.  You are generally not feeling well. SEEK IMMEDIATE MEDICAL CARE IF:   You have pain that radiates from your back into your legs.  You develop new bowel or bladder control problems.  You have unusual weakness or numbness in your arms or legs.  You develop nausea or vomiting.  You develop abdominal pain.  You feel faint. Document Released: 02/09/2005 Document Revised: 08/11/2011 Document Reviewed: 06/13/2013 [facility] Patient Information 2015 [place], [place]. This information is not intended to replace advice given to you by your health care provider. Make sure you discuss any questions you have with your health care provider.

## 2014-10-11 NOTE — Progress Notes (Signed)
Copy of [facility] office note from 8/17  [name] 10/10/2014 10:06 AM Location: [facility] Patient #: [id number] DOB: [birth year] Divorced / Language: [name] / Race: White Female  History of Present Illness [name] Damiyah Ditmars MD; 10/11/2014 6:13 PM) Patient words: postop.  The patient is a 39 year old female presenting status-post bariatric surgery. She underwent laparoscopic sleeve gastrectomy with lysis of adhesions for 1 hour on February 13, 2014. Her initial visit weight was 316 pounds.  Her course has been somewhat complicated. After her sleeve gastrectomy she developed a recurrent fluid in A chronic abdominal wall seroma from her prior open ventral hernia repair done at an outside hospital. She has a history of a Hartman's procedure for perforated diverticulitis followed by colostomy takedown followed by open repair of incisional hernia which was complicated by a seroma which had to be drained?all of this done at outside hospital over 6 years ago. The fluid in her seroma has been persistent. We have percutaneously drained twice. We had to remove a drain early due to patient discomfort. She states that the seroma causes severe abdominal pain as well as back pain.  I ended up taking her back to the operating room on August 4 for ID had a diagnostic laparoscopy. Her sleeve appeared normal. There was some recurrent adhesions in her midline. I also did incision and drainage and laparoscopic exploration of the abdominal wall seroma. I think cultures from the seroma fluid which grew nothing. I placed 10 g of talc powder into the seroma cavity to try to get it to scar down and to stop producing fluid. She went home the same day. She has had ongoing issues since then. She has called the office numerous times. She has been to the emergency room as well. The small incision made to incise and drain the seroma apparently was opened up. She has been doing wet-to-dry gauze packing.  She states that she has seen her psychiatrist and they have adjusted several of her medications. Cultures were obtained of the incision that they opened up which grew nothing. Her white blood cell count has been normal. She is very tearful at times today. She has pressured speech. She states that the muscle relaxant has helped. While I was out last week there were apparently multiple phone calls regarding pain medication. Her narcotic prescriptions are documented in a telephone encounter in [place]. The pharmacy contacted [place] this past Friday alerting [place] to a large number of oxycodone tablets from several different providers including outside our group. She states that she is relatively comfortable. She denies any fevers or chills. She is drinking well. She still has some discomfort with certain foods. She states that she is taking her supplements. Toward the end of the appointment, she asked me to scan her brain that she was concerned she may have her brain tumors that she has had chronic migraines for years. She also asked me to evaluate her for leukemia because she had a family member have a shattered spleen due to leukemia. She also asked that she have a full body scan  She states the drain is putting about half a JP full each day   Allergies (Ammie Eversole, LPN; 6/76/1949 93:26 AM) No Known Drug Allergies12/04/2013  Medication History (Ammie Eversole, LPN; 09/04/4578 99:83 AM) OxyCODONE HCl (10MG  Tablet, 1 (one) Tablet Oral every six hours, as needed, Taken starting 10/08/2014) Active. Robaxin (500MG  Tablet, 1 (one) Tablet Oral every eight hours, as needed, Taken starting 10/08/2014) Active. TraMADol  HCl (50MG  Tablet, 1 -2 Tablet Oral every four-six hours as needed, Taken starting 09/24/2014) Active. Zofran (4MG  Tablet, 1 (one) Tablet Oral every six hours, as needed, Taken starting 09/24/2014) Active. Promethazine HCl (25MG  Tablet, 1 (one) Tablet Oral every six hours, as needed,  Taken starting 09/24/2014) Active. Valtrex (500MG  Tablet, Oral) Active. SEROquel (400MG  Tablet, Oral) Active. KlonoPIN (1MG  Tablet, Oral) Active. Ambien (10MG  Tablet, Oral as needed) Active. NexIUM (20MG  Capsule DR, Oral) Active. Calcium (500MG  Tablet, Oral) Active. Ferrous Sulfate (325 (65 Fe)MG Tablet, Oral) Active. Advil (200MG  Capsule, Oral) Active. Multiple Vitamins (Oral) Active. Medications Reconciled  Vitals (Ammie Eversole LPN; 9/73/5329 92:42 AM) 10/10/2014 10:07 AM Weight: 204.8 lb Height: 68in Body Surface Area: 2.11 m Body Mass Index: 31.14 kg/m Pulse: 90 (Regular)  BP: 178/98 (Sitting, Left Arm, Standard)    Physical Exam [name] [name] MD; 10/11/2014 6:11 PM) General Mental Status-Alert. General Appearance-Consistent with stated age. Hydration-Well hydrated. Voice-Normal.  Head and Neck Head-normocephalic, atraumatic with no lesions or palpable masses. Trachea-midline. Thyroid Gland Characteristics - normal size and consistency.  Eye Eyeball - Bilateral-Extraocular movements intact. Sclera/Conjunctiva - Bilateral-No scleral icterus.  Chest and Lung Exam Chest and lung exam reveals -quiet, even and easy respiratory effort with no use of accessory muscles and on auscultation, normal breath sounds, no adventitious sounds and normal vocal resonance. Inspection Chest Wall - Normal. Back - normal.  Cardiovascular Cardiovascular examination reveals -normal heart sounds, regular rate and rhythm with no murmurs.  Abdomen Inspection  Inspection of the abdomen reveals: Note: drain in mid abdomen. Drain is has a yellowish tinged and cloudy. No cellulitis on skin. Her drain site is a little bit enlarged. She has a small open horizontal incision near the midline. There is no cellulitis. There is good granulation tissue with scant fibrinous exudate. The depth is about 1 cm. She is otherwise soft and nontender. Skin - Scar -  Note: Incision: clean, dry, and intact; well healed trocar sites; old midline incision. Palpation/Percussion Palpation and Percussion of the abdomen reveal - Soft, No Rebound tenderness, No Rigidity (guarding) and No hepatosplenomegaly. Note: obese abdomen; old scars. has central fullness/?lump - ? recurrence of ventral seroma. Auscultation Auscultation of the abdomen reveals - Bowel sounds normal.  Neuropsychiatric Note: tearful at times; fleeting eye contact, tangential thoughts; no SI/HI;     Assessment & Plan [name] Naiyana Barbian MD; 10/11/2014 6:11 PM) S/P LAPAROSCOPIC SLEEVE GASTRECTOMY (V45.86  Z98.84) Story: IV weight 316lbs; preop wt 320 lbs Impression: I spent a lot of time with her again today counseling her. We got her to sign a release form so I could speak with her psychiatrist. She is a little bit emotionally more labile today than usual. We removed some of the sutures around the drain incision. She is to continue wet-to-dry gauze packing of the small open incision near the midline. She does have a history of anemia so I ordered labs for today to see how she is doing with her on iron supplementation which she says she is taking. She states that she has enough oxycodone. I had previously advised her over the phone to decrease her frequency to 6 hours instead of 4 hours. If were not able to wean her off the oxycodone soon I told her she would need to be referred to a pain medicine physician. Current Plans  Patient was instructed to follow-up in one week. CBC, PLATELETS & AUT DIFF (68341) METABOLIC PANEL, COMPREHENSIVE (80053) IRON (83540) IRON BINDING CAPACITY (TIBC) (83550) VITAMIN B-12 (CYANOCOBALAMIN) (  15379) THIAMINE (B-1) (84425) VITAMIN D, 1, 25-DIHYDROXY (43276) FERRITIN (14709) Pt Education - EMW_bariatricFU SEROMA (998.13  T14.8) Impression: Continue drain for now. Because I put white talc powder into her seroma cavity I told her to expect the fluid to be very thick,  potentially yellow as well as at times to have potentially black flakes since I did cauterize some of the abdominal wall seroma. We will bring her back next week for reevaluation ELEVATED BLOOD PRESSURE (401.9  I10) BIPOLAR AFFECTIVE DISORDER, REMISSION STATUS UNSPECIFIED (296.80  F31.9) Impression: I'm going to try to contact her psychiatrist within the next 48 hours. At one point during the consultation today she asked me to order a brain scan, evaluate her for leukemia, and to hospitalize her. She states that she is concerned she may have a tumor in her brain because she has chronic migraines. She states that she had a relative have a shattered spleen because of undiagnosed leukemia. She states that she just also be hospitalized until she gets better. I think her mental illness is definitely contributing and impeding her overall recovery. I did my best to calm and reassure her. I advised her there is no indication she had leukemia based on her blood work in the past. Nonetheless we will check some labs today to make sure that her electrolytes and hemoglobin is okay. I am concerned that she may be nearing potential for psychiatric admission not because she has expressed any desire to harm herself or others but rather her speech is pressured and she is having some tangential random thoughts GASTROESOPHAGEAL REFLUX DISEASE WITHOUT ESOPHAGITIS (530.81  K21.9) OBESITY (BMI 30-39.9) (278.00  E66.9) Impression: I congratulated her on her weight loss. She has lost about 90 pounds since surgery. We discussed the importance of making good food choices. I still think she is struggling with this. We rediscussed proper eating technique IRON DEFICIENCY ANEMIA, UNSPECIFIED IRON DEFICIENCY ANEMIA TYPE (280.9  D50.9) Impression: check cbc and iron panel and see if making any headway in her anemia

## 2014-10-18 ENCOUNTER — Other Ambulatory Visit: Payer: Self-pay | Admitting: General Surgery

## 2014-10-18 ENCOUNTER — Ambulatory Visit: Payer: Medicare Other | Admitting: Dietician

## 2014-10-23 DIAGNOSIS — F29 Unspecified psychosis not due to a substance or known physiological condition: Secondary | ICD-10-CM | POA: Diagnosis not present

## 2014-10-24 DIAGNOSIS — F312 Bipolar disorder, current episode manic severe with psychotic features: Secondary | ICD-10-CM | POA: Diagnosis not present

## 2014-10-25 DIAGNOSIS — F312 Bipolar disorder, current episode manic severe with psychotic features: Secondary | ICD-10-CM | POA: Diagnosis not present

## 2014-10-27 DIAGNOSIS — F312 Bipolar disorder, current episode manic severe with psychotic features: Secondary | ICD-10-CM | POA: Diagnosis not present

## 2014-10-28 DIAGNOSIS — F312 Bipolar disorder, current episode manic severe with psychotic features: Secondary | ICD-10-CM | POA: Diagnosis not present

## 2014-10-29 DIAGNOSIS — F312 Bipolar disorder, current episode manic severe with psychotic features: Secondary | ICD-10-CM | POA: Diagnosis not present

## 2014-10-31 DIAGNOSIS — F3160 Bipolar disorder, current episode mixed, unspecified: Secondary | ICD-10-CM | POA: Diagnosis not present

## 2014-10-31 DIAGNOSIS — T148 Other injury of unspecified body region: Secondary | ICD-10-CM | POA: Diagnosis not present

## 2014-10-31 DIAGNOSIS — D509 Iron deficiency anemia, unspecified: Secondary | ICD-10-CM | POA: Diagnosis not present

## 2014-10-31 DIAGNOSIS — F319 Bipolar disorder, unspecified: Secondary | ICD-10-CM | POA: Diagnosis not present

## 2014-10-31 DIAGNOSIS — Z9884 Bariatric surgery status: Secondary | ICD-10-CM | POA: Diagnosis not present

## 2014-10-31 DIAGNOSIS — I10 Essential (primary) hypertension: Secondary | ICD-10-CM | POA: Diagnosis not present

## 2014-10-31 DIAGNOSIS — E669 Obesity, unspecified: Secondary | ICD-10-CM | POA: Diagnosis not present

## 2014-11-05 ENCOUNTER — Telehealth: Payer: Self-pay | Admitting: Surgery

## 2014-11-05 NOTE — Telephone Encounter (Signed)
Ms. Lindenberger called about multiple little issues.  She had rambling discusion and several questions.  She had a sleeve resection on 02/13/2014 by Dr. Ok Anis. She does not sound sick on the phone.  I think that she just wanted to talk.  Her big concern is her anemia.  She is taking Fe tabs and is worried about this.  Her mother bought her 24 mg tabs.  She is also worried about her kidneys.  She has follow up with [name] and Dr. Beryle Beams.  She will call the office in the AM if she has continued concerns.  Alphonsa Overall, MD, [facility] Pager: [phone] Office phone:  [phone]

## 2014-11-10 LAB — AFB CULTURE WITH SMEAR (NOT AT ARMC): Acid Fast Smear: NONE SEEN

## 2014-11-11 ENCOUNTER — Encounter: Payer: Self-pay | Admitting: General Surgery

## 2014-11-11 NOTE — Progress Notes (Unsigned)
She called today at approximately 2:30 PM complaining of bilateral back pain. She states she had been seen by her local MD and a urine culture was done but she does not have the results. Does not sound to me like this is related to the abdominal wall seroma that [name] has been dealing with. I told her to call the lab tomorrow or her physician's office to get the urine culture results.

## 2014-11-12 ENCOUNTER — Ambulatory Visit (INDEPENDENT_AMBULATORY_CARE_PROVIDER_SITE_OTHER): Payer: Medicare Other | Admitting: Oncology

## 2014-11-12 ENCOUNTER — Other Ambulatory Visit: Payer: Self-pay | Admitting: Oncology

## 2014-11-12 ENCOUNTER — Encounter: Payer: Self-pay | Admitting: Oncology

## 2014-11-12 VITALS — BP 161/98 | HR 116 | Temp 97.8°F | Ht 68.0 in | Wt 213.4 lb

## 2014-11-12 DIAGNOSIS — D508 Other iron deficiency anemias: Secondary | ICD-10-CM

## 2014-11-12 DIAGNOSIS — D6489 Other specified anemias: Secondary | ICD-10-CM | POA: Diagnosis not present

## 2014-11-12 DIAGNOSIS — Z9884 Bariatric surgery status: Secondary | ICD-10-CM

## 2014-11-12 DIAGNOSIS — D649 Anemia, unspecified: Secondary | ICD-10-CM

## 2014-11-12 DIAGNOSIS — D531 Other megaloblastic anemias, not elsewhere classified: Secondary | ICD-10-CM

## 2014-11-12 DIAGNOSIS — D539 Nutritional anemia, unspecified: Secondary | ICD-10-CM | POA: Insufficient documentation

## 2014-11-12 NOTE — Patient Instructions (Signed)
We will call you with lab results Next visit in 6 months: March, 2017.  Lab 1 week before visit

## 2014-11-12 NOTE — Progress Notes (Signed)
Patient ID: [name], female   DOB: 1975-03-06, 39 y.o.   MRN: [id number] New Patient Hematology   HODAN WURTZ [id number] 05-08-1975 38 y.o. 11/12/2014  CC: Dr. Vilinda Flake; [facility]   Reason for referral: Evaluate anemia in a woman who is 9 months status post gastric sleeve resection/bariatric surgery for obesity.   HPI:  39 year old woman who underwent a gastric sleeve resection on 02/13/2014 for morbid obesity. Preop weight  316 pounds. Current weight 213 pounds. She had a pre-existing complicated GI history with a Hartman's procedure done for perforated diverticulitis with a temporary colostomy. She developed an incisional hernia. When this was repaired at time of the recent bariatric procedure she developed a persistent seroma. Due to complaints of persistent abdominal pain and seroma she underwent laparoscopy on 09/27/2014. Lysis of adhesions. Incision and drainage of the seroma. Preoperative labs from 02/08/2014 with hemoglobin 12.6, hematocrit 40.4, MCV 89. Lowest hemoglobin recorded since that time was 8.7 with MCV 79 on 05/04/2014. Concomitant reactive thrombocytosis consistent with iron deficiency. She was put on oral iron replacement. Hemoglobin up to 12.2 by June 3. She had the second surgery on August 4. Preop hemoglobin 11.7 with MCV 90. Little change when repeated on August 13 (10.8). Iron studies done August 17. Iron 28, TIBC 253, percent saturation 11, MCV 85, ferritin 140. Overall she is tolerating the oral iron. She is taking ferrous sulfate 325 mg twice daily. Having some constipation relieved with milk of magnesia. She has had a IUD in place since the birth of her son who is now 63 years old.(Therefore no menstrual blood loss in many years). She reports that sometimes she sees specks of blood in her urine and was just put on antibiotics last week for a presumed urinary tract infection when she also developed low back and bilateral flank pain. Cultures  on that urine came back with less than 10,000 colonies. She denies any hematochezia. Stools are black from the iron. She states when she stops the iron stools are normal color. She continues to have intermittent abdominal pain. She has been pain-free for the last 4 days. She is not currently taking any pain medications. No aspirin or non-steroidals.    PMH: Past Medical History  Diagnosis Date  . GERD (gastroesophageal reflux disease)   . Diverticulitis 2008    perforated/ requiring resection  . Colostomy in place   . Bipolar affective   . Depression   . Anxiety   . Asthma   . Arthritis   . Blood transfusion without reported diagnosis   . Substance abuse     12 years ago-crack cocaine  . Family history of adverse reaction to anesthesia     MGM was placed on vent after surgery  . Sleep apnea     mild, does not use c-pap machine  . Shortness of breath dyspnea   . Headache   . Anemia   Bipolar disorder on multiple medications. History of polysubstance abuse in the past. Cocaine. Marijuana. And ecstasy tablets. Borderline hypertension not currently on medications. No diabetes. Recurrent herpes simplex infections affecting her lips, buttocks, and remotely herpes genitalis.  Past Surgical History  Procedure Laterality Date  . Exploratory laparotomy with resection  2008  . Colostomy reversal  11/2006  . Abdominal wall hernia  07/2007    open repair with lysis of adhesions  . Hx of abd wall seroma  08/2007    Drained via [place] in [place], [place]  . Hx of  abd wall seroma  10/2007    Drained by Dr. Geroge Baseman in office  . Tonsillectomy    . Kidney stones    . Hernia repair    . Examination under anesthesia N/A 05/11/2012    Procedure: EXAM UNDER ANESTHESIA;  Surgeon: Donato Heinz, MD;  Location: [facility];  Service: General;  Laterality: N/A;  . Placement of seton N/A 05/11/2012    Procedure: PLACEMENT OF SETON;  Surgeon: Donato Heinz, MD;  Location: [facility];  Service: General;   Laterality: N/A;  . Treatment fistula anal    . Finger closed reduction Right 08/05/2012    Procedure: CLOSED REDUCTION RIGHT THUMB (FINGER);  Surgeon: Linna Hoff, MD;  Location: [facility];  Service: Orthopedics;  Laterality: Right;  . Esophagogastroduodenoscopy  03/13/2010    DXI:[id number] esophagus, status post passage of a Maloney dilator/Small hiatal hernia/ Antral erosions, status post biopsy  . Colonoscopy  03/13/2010    PFX:[id number] hemorrhoids likely cause of hematochezia, otherwise normal  . Laparoscopic gastric sleeve resection N/A 02/13/2014    Procedure: LAPAROSCOPIC GASTRIC SLEEVE RESECTION LYSIS OF ADHESIONS, UPPER ENDOSCOPY;  Surgeon: [name], MD;  Location: [facility];  Service: General;  Laterality: N/A;  . Root canal 7-16    . Laparoscopy N/A 09/28/2014    Procedure: LAPAROSCOPY DIAGNOSTIC, INCISION AND DRAINAGE WITH LAPAROSCOPIC EXPLORATION OF ABDOMINAL WALL SEROMA with ultrasound;  Surgeon: Greer Pickerel, MD;  Location: [facility];  Service: General;  Laterality: N/A;  Bipolar disorder on multiple medications. History of polysubstance abuse in the past. Cocaine. Marijuana. And ecstasy tablets.  Allergies: No Known Allergies  Medications:  Current outpatient prescriptions:  .  Calcium Carbonate-Vitamin D (CALCIUM + D PO), Take 1 tablet by mouth daily., Disp: , Rfl:  .  clonazePAM (KLONOPIN) 1 MG tablet, Take 1 mg by mouth 2 (two) times daily. , Disp: , Rfl:  .  esomeprazole (NEXIUM) 40 MG capsule, Take 40 mg by mouth daily before lunch., Disp: , Rfl:  .  ferrous sulfate 325 (65 FE) MG tablet, Take 650 mg by mouth 2 (two) times daily. , Disp: , Rfl:  .  ibuprofen (ADVIL,MOTRIN) 200 MG tablet, Take 800 mg by mouth every 6 (six) hours as needed for moderate pain., Disp: , Rfl:  .  lisdexamfetamine (VYVANSE) 40 MG capsule, Take 40 mg by mouth daily. , Disp: , Rfl:  .  Multiple Vitamin (MULTIVITAMIN WITH MINERALS) TABS tablet, Take 1 tablet by mouth 2 (two) times daily.,  Disp: , Rfl:  .  Multiple Vitamins-Minerals (HAIR/SKIN/NAILS/BIOTIN) TABS, Take 1 tablet by mouth 2 (two) times daily., Disp: , Rfl:  .  ondansetron (ZOFRAN ODT) 4 MG disintegrating tablet, Take 1 tablet (4 mg total) by mouth every 8 (eight) hours as needed for nausea or vomiting., Disp: 40 tablet, Rfl: 0 .  oxyCODONE (ROXICODONE) 5 MG immediate release tablet, Take 1 tablet (5 mg total) by mouth every 4 (four) hours as needed for severe pain. (Patient taking differently: Take 10-15 mg by mouth every 4 (four) hours as needed for severe pain. ), Disp: 15 tablet, Rfl: 0 .  PERCOCET 5-325 MG per tablet, Take 1 tablet by mouth every 6 (six) hours as needed for severe pain. (Patient not taking: Reported on 10/06/2014), Disp: 12 tablet, Rfl: 0 .  promethazine (PHENERGAN) 12.5 MG tablet, Take 1-2 tablets (12.5-25 mg total) by mouth every 8 (eight) hours as needed for nausea. (Patient taking differently: Take 12.5-25 mg by mouth every 6 (six) hours as needed  for nausea or vomiting. ), Disp: 20 tablet, Rfl: 0 .  QUEtiapine (SEROQUEL) 400 MG tablet, Take 400 mg by mouth at bedtime. , Disp: , Rfl:  .  traMADol (ULTRAM) 50 MG tablet, Take 1 tablet (50 mg total) by mouth every 6 (six) hours as needed. (Patient not taking: Reported on 10/06/2014), Disp: 10 tablet, Rfl: 0 .  valACYclovir (VALTREX) 500 MG tablet, Take 500 mg by mouth daily as needed (fever blister). , Disp: , Rfl:  .  zolpidem (AMBIEN) 10 MG tablet, Take 10 mg by mouth at bedtime. , Disp: , Rfl:   Social History:   reports that she quit smoking about 10 months ago. Her smoking use included E-cigarettes. She has a 7 pack-year smoking history. She has never used smokeless tobacco. She reports that she does not drink alcohol or use illicit drugs.  Family History: Family History  Problem Relation Age of Onset  . Asthma Maternal Grandmother     Review of Systems: Intermittent abdominal pain with multiple phone calls to the surgery office and emergency  room room  visits. History of migraine but none in the last month. No cardiorespiratory complaints. No paresthesias.  Remaining ROS negative.  Physical Exam: Blood pressure 161/98, pulse 116, temperature 97.8 F (36.6 C), temperature source Oral, height 5\' 8"  (1.727 m), weight 213 lb 6.4 oz (96.798 kg), SpO2 99 %. Wt Readings from Last 3 Encounters:  11/12/14 213 lb 6.4 oz (96.798 kg)  10/01/14 215 lb (97.523 kg)  09/28/14 217 lb (98.431 kg)     General appearance: Well-nourished Caucasian woman. Anxious. Flight of ideas but easily redirected. 47-year-old son accompanies her. HENNT: Pharynx no erythema, exudate, mass, or ulcer. No thyromegaly or thyroid nodules Lymph nodes: No cervical, supraclavicular, or axillary lymphadenopathy Breasts: Lungs: Clear to auscultation, resonant to percussion throughout Heart: Regular rhythm, no murmur, no gallop, no rub, no click, no edema Abdomen: Soft, nontender, normal bowel sounds, no mass, no organomegaly. She still has paper tape over midline incision which I did not remove. Extremities: No edema, no calf tenderness Musculoskeletal: no joint deformities GU: Vascular: Carotid pulses 2+, no bruits, distal pulses: Dorsalis pedis 1+ symmetric Neurologic: Alert, oriented, PERRLA,, cranial nerves grossly normal, motor strength 5 over 5, reflexes 1+ symmetric, upper body coordination normal, gait normal, Skin: No rash or ecchymosis    Lab Results: Lab Results  Component Value Date   WBC 6.6 10/06/2014   HGB 10.8* 10/06/2014   HCT 33.6* 10/06/2014   MCV 88.0 10/06/2014   PLT 395 10/06/2014     Chemistry      Component Value Date/Time   NA 139 10/06/2014 1329   K 3.4* 10/06/2014 1329   CL 106 10/06/2014 1329   CO2 27 10/06/2014 1329   BUN 10 10/06/2014 1329   CREATININE 0.70 10/06/2014 1329   CREATININE 0.69 09/01/2013 0001      Component Value Date/Time   CALCIUM 9.0 10/06/2014 1329   ALKPHOS 73 09/24/2014 1435   AST 12* 09/24/2014  1435   ALT 14 09/24/2014 1435   BILITOT 0.4 09/24/2014 1435      Radiological Studies: No results found.    Impression : Initial iron deficiency anemia related to abdominal surgery in December 2015 corrected with oral iron replacement. Recovery to hemoglobin 12.2 with subsequent minor fall to hemoglobin 10.8 reflecting another surgical procedure done on August 4.      Annia Belt, MD 11/12/2014, 5:47 PM

## 2014-11-13 ENCOUNTER — Telehealth: Payer: Self-pay | Admitting: Oncology

## 2014-11-13 LAB — CBC WITH DIFFERENTIAL/PLATELET
Basophils Absolute: 0 10*3/uL (ref 0.0–0.2)
Basos: 0 %
EOS (ABSOLUTE): 0.4 10*3/uL (ref 0.0–0.4)
Eos: 5 %
Hematocrit: 38.7 % (ref 34.0–46.6)
Hemoglobin: 12.5 g/dL (ref 11.1–15.9)
Immature Grans (Abs): 0 10*3/uL (ref 0.0–0.1)
Immature Granulocytes: 0 %
Lymphocytes Absolute: 2 10*3/uL (ref 0.7–3.1)
Lymphs: 28 %
MCH: 28.2 pg (ref 26.6–33.0)
MCHC: 32.3 g/dL (ref 31.5–35.7)
MCV: 87 fL (ref 79–97)
Monocytes Absolute: 0.4 10*3/uL (ref 0.1–0.9)
Monocytes: 6 %
Neutrophils Absolute: 4.2 10*3/uL (ref 1.4–7.0)
Neutrophils: 61 %
Platelets: 347 10*3/uL (ref 150–379)
RBC: 4.44 x10E6/uL (ref 3.77–5.28)
RDW: 13.7 % (ref 12.3–15.4)
WBC: 7 10*3/uL (ref 3.4–10.8)

## 2014-11-13 LAB — FERRITIN: Ferritin: 167 ng/mL — ABNORMAL HIGH (ref 15–150)

## 2014-11-13 LAB — IRON AND TIBC
Iron Saturation: 16 % (ref 15–55)
Iron: 46 ug/dL (ref 27–159)
Total Iron Binding Capacity: 290 ug/dL (ref 250–450)
UIBC: 244 ug/dL (ref 131–425)

## 2014-11-13 LAB — RETICULOCYTES: Retic Ct Pct: 1 % (ref 0.6–2.6)

## 2014-11-13 LAB — VITAMIN B12: Vitamin B-12: 555 pg/mL (ref 211–946)

## 2014-11-13 LAB — FOLATE: Folate: 5 ng/mL (ref >3.0)

## 2014-11-13 NOTE — Telephone Encounter (Signed)
Patient calling about results from bloodwork for yesterdays appt

## 2014-11-14 ENCOUNTER — Telehealth: Payer: Self-pay | Admitting: *Deleted

## 2014-11-14 NOTE — Progress Notes (Signed)
Patient ID: [name], female   DOB: Feb 07, 1976, 39 y.o.   MRN: [id number] Lab done at time of 11/12/2014 patient visit: Hemoglobin 12.5, hematocrit 38.7, MCV 87 Iron 46, TIBC 290, ferritin 167 Folate 5.0, B12 mid range normal at 555  She has had a near complete response to oral iron replacement and has good iron stores. Normal I32 and folic acid levels. No evidence for malabsorption at this time. Patient was called with the lab results and advised to continue oral iron. She may develop malabsorption over time so I would recommend checking blood counts to or 3 times a year specifically CBC and ferritin to assess iron stores. He fired stores become depleted while on oral iron, then she would be a candidate for parenteral iron.

## 2014-11-14 NOTE — Telephone Encounter (Signed)
-----   Message from Annia Belt, MD sent at 11/14/2014  8:55 AM EDT ----- Call pt: blood count now normal: Hemoglobin up to 12.5. Iron and vitamin levels normal. Stay on iron pills. I let her surgeon know the good news.

## 2014-11-14 NOTE — Telephone Encounter (Signed)
Pricilla Handler left message for pt 11/14/14 10:42AM about lab results.

## 2014-11-14 NOTE — Telephone Encounter (Signed)
Called pt - home and mobile phones; no answer. Left message "blood count now normal: Hemoglobin up to 12.5. Iron and vitamin levels normal. Stay on iron pills." per Dr Beryle Beams. And he will inform her surgeon of the "good news" but to call if she has any questions.

## 2014-11-19 ENCOUNTER — Encounter (HOSPITAL_COMMUNITY): Payer: Self-pay | Admitting: Emergency Medicine

## 2014-11-19 ENCOUNTER — Emergency Department (HOSPITAL_COMMUNITY)
Admission: EM | Admit: 2014-11-19 | Discharge: 2014-11-19 | Disposition: A | Discharge status: Home / Self Care | Payer: Medicare Other | Attending: Emergency Medicine | Admitting: Emergency Medicine

## 2014-11-19 ENCOUNTER — Telehealth: Payer: Self-pay | Admitting: Surgery

## 2014-11-19 DIAGNOSIS — Z8669 Personal history of other diseases of the nervous system and sense organs: Secondary | ICD-10-CM | POA: Insufficient documentation

## 2014-11-19 DIAGNOSIS — Y998 Other external cause status: Secondary | ICD-10-CM | POA: Insufficient documentation

## 2014-11-19 DIAGNOSIS — M1990 Unspecified osteoarthritis, unspecified site: Secondary | ICD-10-CM | POA: Diagnosis not present

## 2014-11-19 DIAGNOSIS — Y9389 Activity, other specified: Secondary | ICD-10-CM | POA: Insufficient documentation

## 2014-11-19 DIAGNOSIS — M5442 Lumbago with sciatica, left side: Secondary | ICD-10-CM

## 2014-11-19 DIAGNOSIS — D649 Anemia, unspecified: Secondary | ICD-10-CM | POA: Diagnosis not present

## 2014-11-19 DIAGNOSIS — Z79899 Other long term (current) drug therapy: Secondary | ICD-10-CM | POA: Diagnosis not present

## 2014-11-19 DIAGNOSIS — F419 Anxiety disorder, unspecified: Secondary | ICD-10-CM | POA: Insufficient documentation

## 2014-11-19 DIAGNOSIS — J45909 Unspecified asthma, uncomplicated: Secondary | ICD-10-CM | POA: Insufficient documentation

## 2014-11-19 DIAGNOSIS — W010XXA Fall on same level from slipping, tripping and stumbling without subsequent striking against object, initial encounter: Secondary | ICD-10-CM | POA: Insufficient documentation

## 2014-11-19 DIAGNOSIS — Y9289 Other specified places as the place of occurrence of the external cause: Secondary | ICD-10-CM | POA: Diagnosis not present

## 2014-11-19 DIAGNOSIS — Z87891 Personal history of nicotine dependence: Secondary | ICD-10-CM | POA: Diagnosis not present

## 2014-11-19 DIAGNOSIS — F319 Bipolar disorder, unspecified: Secondary | ICD-10-CM | POA: Insufficient documentation

## 2014-11-19 DIAGNOSIS — K219 Gastro-esophageal reflux disease without esophagitis: Secondary | ICD-10-CM | POA: Insufficient documentation

## 2014-11-19 DIAGNOSIS — S3992XA Unspecified injury of lower back, initial encounter: Secondary | ICD-10-CM | POA: Diagnosis not present

## 2014-11-19 MED ORDER — PREDNISONE 20 MG PO TABS: 40.0000 mg | ORAL_TABLET | Freq: Every day | ORAL | Status: DC

## 2014-11-19 NOTE — Discharge Instructions (Signed)
Take prednisone as prescribed for symptoms. Alternate ice and heat to your low back 4 times per day for 15-20 minutes each time. You may take oxycodone, as prescribed by [name], as needed for severe pain. Follow-up with a primary care doctor for further evaluation of her symptoms.  Back Pain, Adult Back pain is very common. The pain often gets better over time. The cause of back pain is usually not dangerous. Most people can learn to manage their back pain on their own.  HOME CARE   Stay active. Start with short walks on flat ground if you can. Try to walk farther each day.  Do not sit, drive, or stand in one place for more than 30 minutes. Do not stay in bed.  Do not avoid exercise or work. Activity can help your back heal faster.  Be careful when you bend or lift an object. Bend at your knees, keep the object close to you, and do not twist.  Sleep on a firm mattress. Lie on your side, and bend your knees. If you lie on your back, put a pillow under your knees.  Only take medicines as told by your doctor.  Put ice on the injured area.  Put ice in a plastic bag.  Place a towel between your skin and the bag.  Leave the ice on for 15-20 minutes, 03-04 times a day for the first 2 to 3 days. After that, you can switch between ice and heat packs.  Ask your doctor about back exercises or massage.  Avoid feeling anxious or stressed. Find good ways to deal with stress, such as exercise. GET HELP RIGHT AWAY IF:   Your pain does not go away with rest or medicine.  Your pain does not go away in 1 week.  You have new problems.  You do not feel well.  The pain spreads into your legs.  You cannot control when you poop (bowel movement) or pee (urinate).  Your arms or legs feel weak or lose feeling (numbness).  You feel sick to your stomach (nauseous) or throw up (vomit).  You have belly (abdominal) pain.  You feel like you may pass out (faint). MAKE SURE YOU:   Understand  these instructions.  Will watch your condition.  Will get help right away if you are not doing well or get worse. Document Released: 07/29/2007 Document Revised: 05/04/2011 Document Reviewed: 06/13/2013 [facility] Patient Information 2015 [place], [place]. This information is not intended to replace advice given to you by your health care provider. Make sure you discuss any questions you have with your health care provider.  Sciatica Sciatica is pain, weakness, numbness, or tingling along the path of the sciatic nerve. The nerve starts in the lower back and runs down the back of each leg. The nerve controls the muscles in the lower leg and in the back of the knee, while also providing sensation to the back of the thigh, lower leg, and the sole of your foot. Sciatica is a symptom of another medical condition. For instance, nerve damage or certain conditions, such as a herniated disk or bone spur on the spine, pinch or put pressure on the sciatic nerve. This causes the pain, weakness, or other sensations normally associated with sciatica. Generally, sciatica only affects one side of the body. CAUSES   Herniated or slipped disc.  Degenerative disk disease.  A pain disorder involving the narrow muscle in the buttocks (piriformis syndrome).  Pelvic injury or fracture.  Pregnancy.  Tumor (rare). SYMPTOMS  Symptoms can vary from mild to very severe. The symptoms usually travel from the low back to the buttocks and down the back of the leg. Symptoms can include:  Mild tingling or dull aches in the lower back, leg, or hip.  Numbness in the back of the calf or sole of the foot.  Burning sensations in the lower back, leg, or hip.  Sharp pains in the lower back, leg, or hip.  Leg weakness.  Severe back pain inhibiting movement. These symptoms may get worse with coughing, sneezing, laughing, or prolonged sitting or standing. Also, being overweight may worsen symptoms. DIAGNOSIS  Your caregiver  will perform a physical exam to look for common symptoms of sciatica. He or she may ask you to do certain movements or activities that would trigger sciatic nerve pain. Other tests may be performed to find the cause of the sciatica. These may include:  Blood tests.  X-rays.  Imaging tests, such as an MRI or CT scan. TREATMENT  Treatment is directed at the cause of the sciatic pain. Sometimes, treatment is not necessary and the pain and discomfort goes away on its own. If treatment is needed, your caregiver may suggest:  Over-the-counter medicines to relieve pain.  Prescription medicines, such as anti-inflammatory medicine, muscle relaxants, or narcotics.  Applying heat or ice to the painful area.  Steroid injections to lessen pain, irritation, and inflammation around the nerve.  Reducing activity during periods of pain.  Exercising and stretching to strengthen your abdomen and improve flexibility of your spine. Your caregiver may suggest losing weight if the extra weight makes the back pain worse.  Physical therapy.  Surgery to eliminate what is pressing or pinching the nerve, such as a bone spur or part of a herniated disk. HOME CARE INSTRUCTIONS   Only take over-the-counter or prescription medicines for pain or discomfort as directed by your caregiver.  Apply ice to the affected area for 20 minutes, 3-4 times a day for the first 48-72 hours. Then try heat in the same way.  Exercise, stretch, or perform your usual activities if these do not aggravate your pain.  Attend physical therapy sessions as directed by your caregiver.  Keep all follow-up appointments as directed by your caregiver.  Do not wear high heels or shoes that do not provide proper support.  Check your mattress to see if it is too soft. A firm mattress may lessen your pain and discomfort. SEEK IMMEDIATE MEDICAL CARE IF:   You lose control of your bowel or bladder (incontinence).  You have increasing  weakness in the lower back, pelvis, buttocks, or legs.  You have redness or swelling of your back.  You have a burning sensation when you urinate.  You have pain that gets worse when you lie down or awakens you at night.  Your pain is worse than you have experienced in the past.  Your pain is lasting longer than 4 weeks.  You are suddenly losing weight without reason. MAKE SURE YOU:  Understand these instructions.  Will watch your condition.  Will get help right away if you are not doing well or get worse. Document Released: 02/03/2001 Document Revised: 08/11/2011 Document Reviewed: 06/21/2011 [facility] Patient Information 2015 [place], [place]. This information is not intended to replace advice given to you by your health care provider. Make sure you discuss any questions you have with your health care provider.

## 2014-11-19 NOTE — Telephone Encounter (Signed)
[name]  [birth year] [id number]  Patient Care Team: Redmond School, MD as PCP - General (Internal Medicine) Greer Pickerel, MD as Consulting Physician (General Surgery)  This patient is a 39 y.o.female who calls today for surgical evaluation.   DATE OF PROCEDURE: 09/28/2014 DATE OF DISCHARGE: 09/28/2014   OPERATIVE REPORT   PREOPERATIVE DIAGNOSIS: 1. Chronic abdominal wall seroma. 2. Incisional hernia.  POSTOPERATIVE DIAGNOSIS: 1. Chronic abdominal wall seroma. 2. Incisional hernia.  PROCEDURE: 1. Diagnostic laparoscopy. 2. Incision and drainage and laparoscopic exploration of abdominal  wall seroma using ultrasound guidance.  SURGEON: Leighton Ruff. Redmond Pulling, MD, FACS  ASSISTANT: Servando Snare, RNFA.  Reason for call: Abdominal and back pain.  Morbidly obese.  Female status post laparoscopic sleeve gastrectomy December 2015.  Lower abdominal incisional hernia from prior surgeries.  Developed seroma.  Underwent interventions.  Went laparoscopically assisted drainage and debridement of seroma 7 weeks ago.  Struggles with severe abdominal and back pain.  She called 4 times last night noting that after she got up her abdomen and lower back "hurt bad" .  Was using heating pads and pills.  Having bowel movements.  No nausea or vomiting.  She was debating about whether or not to come to the emergency room but wanted advice.  Initially seemed that it is tolerable but then she was concerned and wished come the emergency room.  Then she did not know which emergency room to come new.  Then she wanted of [name] was going to see her at one in the morning.  I noted the safe single do was to have her evaluated emergency room to make sure there is nothing serious going on.  I noted it was unlikely that there is a life-threatening problem 7 weeks out from drainage of an abdominal wall seroma.  Some issues challenge her recovery given her history of anxiety,  depression, bipolar disorder.  Also numerous surgeries and interventions over the past several years.  Last call noted she was thinking about coming to the emergency room.  Patient Active Problem List   Diagnosis Date Noted  . Anemia associated with nutritional deficiency 11/12/2014  . Bipolar affective disorder 09/28/2014  . Anxiety state 09/28/2014  . S/P laparoscopic sleeve gastrectomy 01/2014 09/28/2014  . Abdominal wall seroma - chronic   . Seroma   . Cholelithiases 04/25/2013  . Cough 09/19/2011  . Smoker 09/19/2011  . GERD 03/04/2010  . RECTAL BLEEDING 03/04/2010  . DYSPHAGIA UNSPECIFIED 03/04/2010  . DYSPHAGIA 03/04/2010  . RUQ PAIN 03/04/2010    Past Medical History  Diagnosis Date  . GERD (gastroesophageal reflux disease)   . Diverticulitis 2008    perforated/ requiring resection  . Colostomy in place   . Bipolar affective   . Depression   . Anxiety   . Asthma   . Arthritis   . Blood transfusion without reported diagnosis   . Substance abuse     12 years ago-crack cocaine  . Family history of adverse reaction to anesthesia     MGM was placed on vent after surgery  . Sleep apnea     mild, does not use c-pap machine  . Shortness of breath dyspnea   . Headache   . Anemia     Past Surgical History  Procedure Laterality Date  . Exploratory laparotomy with resection  2008  . Colostomy reversal  11/2006  . Abdominal wall hernia  07/2007    open repair with lysis of adhesions  . Hx of abd wall  seroma  08/2007    Drained via [place] in [place], [place]  . Hx of abd wall seroma  10/2007    Drained by Dr. Geroge Baseman in office  . Tonsillectomy    . Kidney stones    . Hernia repair    . Examination under anesthesia N/A 05/11/2012    Procedure: EXAM UNDER ANESTHESIA;  Surgeon: Donato Heinz, MD;  Location: [facility];  Service: General;  Laterality: N/A;  . Placement of seton N/A 05/11/2012    Procedure: PLACEMENT OF SETON;  Surgeon: Donato Heinz, MD;  Location: [facility];   Service: General;  Laterality: N/A;  . Treatment fistula anal    . Finger closed reduction Right 08/05/2012    Procedure: CLOSED REDUCTION RIGHT THUMB (FINGER);  Surgeon: Linna Hoff, MD;  Location: [facility];  Service: Orthopedics;  Laterality: Right;  . Esophagogastroduodenoscopy  03/13/2010    HWE:[id number] esophagus, status post passage of a Maloney dilator/Small hiatal hernia/ Antral erosions, status post biopsy  . Colonoscopy  03/13/2010    FBP:ZWCHENID hemorrhoids likely cause of hematochezia, otherwise normal  . Laparoscopic gastric sleeve resection N/A 02/13/2014    Procedure: LAPAROSCOPIC GASTRIC SLEEVE RESECTION LYSIS OF ADHESIONS, UPPER ENDOSCOPY;  Surgeon: [name], MD;  Location: [facility];  Service: General;  Laterality: N/A;  . Root canal 7-16    . Laparoscopy N/A 09/28/2014    Procedure: LAPAROSCOPY DIAGNOSTIC, INCISION AND DRAINAGE WITH LAPAROSCOPIC EXPLORATION OF ABDOMINAL WALL SEROMA with ultrasound;  Surgeon: Greer Pickerel, MD;  Location: [facility];  Service: General;  Laterality: N/A;    Social History   Social History  . Marital Status: Divorced    Spouse Name: N/A  . Number of Children: 2  . Years of Education: N/A   Occupational History  . Dental Assistant    Social History Main Topics  . Smoking status: Former Smoker -- 0.50 packs/day for 14 years    Types: E-cigarettes    Quit date: 12/20/2013  . Smokeless tobacco: Never Used     Comment: Vapor cigarettes.  . Alcohol Use: No  . Drug Use: No  . Sexual Activity: Not on file   Other Topics Concern  . Not on file   Social History Narrative    Family History  Problem Relation Age of Onset  . Asthma Maternal Grandmother     Current Outpatient Prescriptions  Medication Sig Dispense Refill  . Calcium Carbonate-Vitamin D (CALCIUM + D PO) Take 1 tablet by mouth daily.    . clonazePAM (KLONOPIN) 1 MG tablet Take 1 mg by mouth 2 (two) times daily.     Marland Kitchen esomeprazole (NEXIUM) 40 MG capsule Take 40 mg  by mouth daily before lunch.    . ferrous sulfate 325 (65 FE) MG tablet Take 650 mg by mouth 2 (two) times daily.     Marland Kitchen ibuprofen (ADVIL,MOTRIN) 200 MG tablet Take 800 mg by mouth every 6 (six) hours as needed for moderate pain.    Marland Kitchen lisdexamfetamine (VYVANSE) 40 MG capsule Take 40 mg by mouth daily.     . Multiple Vitamin (MULTIVITAMIN WITH MINERALS) TABS tablet Take 1 tablet by mouth 2 (two) times daily.    . Multiple Vitamins-Minerals (HAIR/SKIN/NAILS/BIOTIN) TABS Take 1 tablet by mouth 2 (two) times daily.    . ondansetron (ZOFRAN ODT) 4 MG disintegrating tablet Take 1 tablet (4 mg total) by mouth every 8 (eight) hours as needed for nausea or vomiting. 40 tablet 0  . oxyCODONE (ROXICODONE) 5 MG immediate  release tablet Take 1 tablet (5 mg total) by mouth every 4 (four) hours as needed for severe pain. (Patient taking differently: Take 10-15 mg by mouth every 4 (four) hours as needed for severe pain. ) 15 tablet 0  . PERCOCET 5-325 MG per tablet Take 1 tablet by mouth every 6 (six) hours as needed for severe pain. (Patient not taking: Reported on 10/06/2014) 12 tablet 0  . predniSONE (DELTASONE) 20 MG tablet Take 2 tablets (40 mg total) by mouth daily. 10 tablet 0  . promethazine (PHENERGAN) 12.5 MG tablet Take 1-2 tablets (12.5-25 mg total) by mouth every 8 (eight) hours as needed for nausea. (Patient taking differently: Take 12.5-25 mg by mouth every 6 (six) hours as needed for nausea or vomiting. ) 20 tablet 0  . QUEtiapine (SEROQUEL) 400 MG tablet Take 400 mg by mouth at bedtime.     . traMADol (ULTRAM) 50 MG tablet Take 1 tablet (50 mg total) by mouth every 6 (six) hours as needed. (Patient not taking: Reported on 10/06/2014) 10 tablet 0  . valACYclovir (VALTREX) 500 MG tablet Take 500 mg by mouth daily as needed (fever blister).     Marland Kitchen zolpidem (AMBIEN) 10 MG tablet Take 10 mg by mouth at bedtime.      No current facility-administered medications for this visit.     No Known  Allergies  @VS @  No results found.  Note: This dictation was prepared with Dragon/digital dictation along with [employer]. Any transcriptional errors that result from this process are unintentional.

## 2014-11-19 NOTE — ED Provider Notes (Signed)
CSN: [id number]     Arrival date & time 11/19/14  0207 History   First MD Initiated Contact with Patient 11/19/14 0217     Chief Complaint  Patient presents with  . Fall     (Consider location/radiation/quality/duration/timing/severity/associated sxs/prior Treatment) HPI Comments: 39 year old female presents to the emergency department for further evaluation of left-sided low back pain. She reports that she was chasing her dog when she tripped and fell on her bilateral knees. She has been experiencing pain in her back since this time. She has had no direct trauma or injury to her back. Pain is sharp and radiates down her posterior and lateral left leg to her left foot. She states that pain is worse with initial movement, but improves somewhat with ambulation. Patient took 2 oxycodone tablets at 2200 for pain with some relief. She states that she has been taking Tylenol and using a heating pad without any improvement. Patient denies fever, head trauma or loss of consciousness during the fall, bowel or bladder incontinence, or extremity numbness or weakness.  Patient is a 39 y.o. female presenting with fall. The history is provided by the patient. No language interpreter was used.  Fall    Past Medical History  Diagnosis Date  . GERD (gastroesophageal reflux disease)   . Diverticulitis 2008    perforated/ requiring resection  . Colostomy in place   . Bipolar affective   . Depression   . Anxiety   . Asthma   . Arthritis   . Blood transfusion without reported diagnosis   . Substance abuse     12 years ago-crack cocaine  . Family history of adverse reaction to anesthesia     MGM was placed on vent after surgery  . Sleep apnea     mild, does not use c-pap machine  . Shortness of breath dyspnea   . Headache   . Anemia    Past Surgical History  Procedure Laterality Date  . Exploratory laparotomy with resection  2008  . Colostomy reversal  11/2006  . Abdominal wall hernia  07/2007    open repair with lysis of adhesions  . Hx of abd wall seroma  08/2007    Drained via [place] in [place], [place]  . Hx of abd wall seroma  10/2007    Drained by Dr. Geroge Baseman in office  . Tonsillectomy    . Kidney stones    . Hernia repair    . Examination under anesthesia N/A 05/11/2012    Procedure: EXAM UNDER ANESTHESIA;  Surgeon: Donato Heinz, MD;  Location: [facility];  Service: General;  Laterality: N/A;  . Placement of seton N/A 05/11/2012    Procedure: PLACEMENT OF SETON;  Surgeon: Donato Heinz, MD;  Location: [facility];  Service: General;  Laterality: N/A;  . Treatment fistula anal    . Finger closed reduction Right 08/05/2012    Procedure: CLOSED REDUCTION RIGHT THUMB (FINGER);  Surgeon: Linna Hoff, MD;  Location: [facility];  Service: Orthopedics;  Laterality: Right;  . Esophagogastroduodenoscopy  03/13/2010    VZC:[id number] esophagus, status post passage of a Maloney dilator/Small hiatal hernia/ Antral erosions, status post biopsy  . Colonoscopy  03/13/2010    VEH:[id number] hemorrhoids likely cause of hematochezia, otherwise normal  . Laparoscopic gastric sleeve resection N/A 02/13/2014    Procedure: LAPAROSCOPIC GASTRIC SLEEVE RESECTION LYSIS OF ADHESIONS, UPPER ENDOSCOPY;  Surgeon: [name], MD;  Location: [facility];  Service: General;  Laterality: N/A;  . Root canal 7-16    .  Laparoscopy N/A 09/28/2014    Procedure: LAPAROSCOPY DIAGNOSTIC, INCISION AND DRAINAGE WITH LAPAROSCOPIC EXPLORATION OF ABDOMINAL WALL SEROMA with ultrasound;  Surgeon: Greer Pickerel, MD;  Location: [facility];  Service: General;  Laterality: N/A;   Family History  Problem Relation Age of Onset  . Asthma Maternal Grandmother    Social History  Substance Use Topics  . Smoking status: Former Smoker -- 0.50 packs/day for 14 years    Types: E-cigarettes    Quit date: 12/20/2013  . Smokeless tobacco: Never Used     Comment: Vapor cigarettes.  . Alcohol Use: No   OB History    No data available      Review  of Systems  Musculoskeletal: Positive for back pain.  All other systems reviewed and are negative.   Allergies  Review of patient's allergies indicates no known allergies.  Home Medications   Prior to Admission medications   Medication Sig Start Date End Date Taking? Authorizing Provider  Calcium Carbonate-Vitamin D (CALCIUM + D PO) Take 1 tablet by mouth daily.   Yes Historical Provider, MD  clonazePAM (KLONOPIN) 1 MG tablet Take 1 mg by mouth 2 (two) times daily.    Yes Historical Provider, MD  esomeprazole (NEXIUM) 40 MG capsule Take 40 mg by mouth daily before lunch.   Yes Historical Provider, MD  ferrous sulfate 325 (65 FE) MG tablet Take 650 mg by mouth 2 (two) times daily.    Yes Historical Provider, MD  ibuprofen (ADVIL,MOTRIN) 200 MG tablet Take 800 mg by mouth every 6 (six) hours as needed for moderate pain.   Yes Historical Provider, MD  lisdexamfetamine (VYVANSE) 40 MG capsule Take 40 mg by mouth daily.    Yes Historical Provider, MD  Multiple Vitamin (MULTIVITAMIN WITH MINERALS) TABS tablet Take 1 tablet by mouth 2 (two) times daily.   Yes Historical Provider, MD  Multiple Vitamins-Minerals (HAIR/SKIN/NAILS/BIOTIN) TABS Take 1 tablet by mouth 2 (two) times daily.   Yes Historical Provider, MD  oxyCODONE (ROXICODONE) 5 MG immediate release tablet Take 1 tablet (5 mg total) by mouth every 4 (four) hours as needed for severe pain. Patient taking differently: Take 10-15 mg by mouth every 4 (four) hours as needed for severe pain.  10/01/14  Yes Christopher Lawyer, PA-C  QUEtiapine (SEROQUEL) 400 MG tablet Take 400 mg by mouth at bedtime.    Yes Historical Provider, MD  valACYclovir (VALTREX) 500 MG tablet Take 500 mg by mouth daily as needed (fever blister).  03/01/13  Yes Historical Provider, MD  zolpidem (AMBIEN) 10 MG tablet Take 10 mg by mouth at bedtime.    Yes Historical Provider, MD  ondansetron (ZOFRAN ODT) 4 MG disintegrating tablet Take 1 tablet (4 mg total) by mouth every 8  (eight) hours as needed for nausea or vomiting. 09/28/14   Greer Pickerel, MD  PERCOCET 5-325 MG per tablet Take 1 tablet by mouth every 6 (six) hours as needed for severe pain. Patient not taking: Reported on 10/06/2014 10/01/14   Dalia Heading, PA-C  predniSONE (DELTASONE) 20 MG tablet Take 2 tablets (40 mg total) by mouth daily. 11/19/14   Antonietta Breach, PA-C  promethazine (PHENERGAN) 12.5 MG tablet Take 1-2 tablets (12.5-25 mg total) by mouth every 8 (eight) hours as needed for nausea. Patient taking differently: Take 12.5-25 mg by mouth every 6 (six) hours as needed for nausea or vomiting.  02/16/14   Alphonsa Overall, MD  traMADol (ULTRAM) 50 MG tablet Take 1 tablet (50 mg total) by mouth every  6 (six) hours as needed. Patient not taking: Reported on 10/06/2014 10/01/14   Dalia Heading, PA-C   BP 152/88 mmHg  Pulse 103  Temp(Src) 98.2 F (36.8 C) (Oral)  Resp 16  SpO2 100%   Physical Exam  Constitutional: She is oriented to person, place, and time. She appears well-developed and well-nourished. No distress.  HENT:  Head: Normocephalic and atraumatic.  Eyes: Conjunctivae and EOM are normal. No scleral icterus.  Neck: Normal range of motion.  Cardiovascular: Regular rhythm and intact distal pulses.   DP and PT pulses 2+ bilaterally  Pulmonary/Chest: Effort normal. No respiratory distress.  Respirations even and unlabored  Musculoskeletal: Normal range of motion. She exhibits tenderness.  Tenderness to palpation to the lumbar paraspinal muscles. No appreciable spasm. No tenderness to palpation to the thoracic or lumbar midline. No bony deformities, step-offs, or crepitus.  Neurological: She is alert and oriented to person, place, and time. She exhibits normal muscle tone. Coordination normal.  Sensation to light touch intact in bilateral lower extremities. Patient ambulatory with steady gait.  Skin: Skin is warm and dry. No rash noted. She is not diaphoretic. No erythema. No pallor.  Scab  noted to right knee  Psychiatric: She has a normal mood and affect. Her behavior is normal.  Nursing note and vitals reviewed.   ED Course  Procedures (including critical care time) Labs Review Labs Reviewed - No data to display  Imaging Review No results found.   I have personally reviewed and evaluated these images and lab results as part of my medical decision-making.   EKG Interpretation None      MDM   Final diagnoses:  Left-sided low back pain with left-sided sciatica    39 year old female presents to the emergency department for evaluation of left-sided back pain radiating down her left lower extremity after a fall on her knees 1 week ago. Patient is neurovascularly intact. No red flags or signs concerning for cauda equina. Patient ambulatory in the emergency department with steady gait. Symptoms consistent with low back pain with left sided sciatica. We will add prednisone course to oxycodone which patient has been taking for pain. Patient is unable to take NSAIDs secondary to a gastric sleeve. Patient referred to a primary care doctor for recheck of symptoms. Return precautions given at discharge. Patient agreeable to plan with no unaddressed concerns. Patient discharged in good condition.   Filed Vitals:   11/19/14 0213  BP: 152/88  Pulse: 103  Temp: 98.2 F (36.8 C)  TempSrc: Oral  Resp: 16  SpO2: 100%     Antonietta Breach, PA-C 11/19/14 [place], MD 11/19/14 [phone]

## 2014-11-19 NOTE — ED Notes (Addendum)
Pt states that she fell last week and has had increasingly more pain in her back that radiates down her L leg into her foot. Took two oxycodone at 10pm.  Alert and oriented.

## 2014-11-19 NOTE — ED Notes (Signed)
Patient is having lower back pain. She states the pain is radiating down left leg down into her foot. Patient complaining she is in so much pain that she can not eat. Patient does have a yellowish scab on right knee from fall last week. Patient says she fell forward and caught her fall.

## 2014-11-20 ENCOUNTER — Emergency Department (HOSPITAL_COMMUNITY): Payer: Medicare Other

## 2014-11-20 ENCOUNTER — Emergency Department (HOSPITAL_COMMUNITY)
Admission: EM | Admit: 2014-11-20 | Discharge: 2014-11-20 | Disposition: A | Discharge status: Home / Self Care | Payer: Medicare Other | Attending: Emergency Medicine | Admitting: Emergency Medicine

## 2014-11-20 ENCOUNTER — Encounter (HOSPITAL_COMMUNITY): Payer: Self-pay | Admitting: Emergency Medicine

## 2014-11-20 DIAGNOSIS — K219 Gastro-esophageal reflux disease without esophagitis: Secondary | ICD-10-CM | POA: Insufficient documentation

## 2014-11-20 DIAGNOSIS — M5442 Lumbago with sciatica, left side: Secondary | ICD-10-CM | POA: Diagnosis not present

## 2014-11-20 DIAGNOSIS — Z79899 Other long term (current) drug therapy: Secondary | ICD-10-CM | POA: Insufficient documentation

## 2014-11-20 DIAGNOSIS — M545 Low back pain: Secondary | ICD-10-CM | POA: Diagnosis present

## 2014-11-20 DIAGNOSIS — F419 Anxiety disorder, unspecified: Secondary | ICD-10-CM | POA: Insufficient documentation

## 2014-11-20 DIAGNOSIS — D649 Anemia, unspecified: Secondary | ICD-10-CM | POA: Insufficient documentation

## 2014-11-20 DIAGNOSIS — J45909 Unspecified asthma, uncomplicated: Secondary | ICD-10-CM | POA: Insufficient documentation

## 2014-11-20 DIAGNOSIS — Z8669 Personal history of other diseases of the nervous system and sense organs: Secondary | ICD-10-CM | POA: Insufficient documentation

## 2014-11-20 DIAGNOSIS — Z7952 Long term (current) use of systemic steroids: Secondary | ICD-10-CM | POA: Insufficient documentation

## 2014-11-20 DIAGNOSIS — Z87891 Personal history of nicotine dependence: Secondary | ICD-10-CM | POA: Diagnosis not present

## 2014-11-20 DIAGNOSIS — F319 Bipolar disorder, unspecified: Secondary | ICD-10-CM | POA: Insufficient documentation

## 2014-11-20 DIAGNOSIS — M5136 Other intervertebral disc degeneration, lumbar region: Secondary | ICD-10-CM | POA: Diagnosis not present

## 2014-11-20 MED ORDER — CYCLOBENZAPRINE HCL 10 MG PO TABS
10.0000 mg | ORAL_TABLET | Freq: Once | ORAL | Status: AC
  Administered 2014-11-20: 10 mg via ORAL
  Filled 2014-11-20 (×2): qty 1

## 2014-11-20 MED ORDER — CYCLOBENZAPRINE HCL 5 MG PO TABS: 5.0000 mg | ORAL_TABLET | Freq: Two times a day (BID) | ORAL | Status: DC | PRN

## 2014-11-20 NOTE — ED Notes (Signed)
Pt. reports persistent low back pain radiating to left leg  for several days , seen at [facility] yesterday with the same complaint prescribed with Prednisone with no improvement .

## 2014-11-20 NOTE — ED Notes (Signed)
Pt left white bra in room. Placed in belongings bag and will be placed in dirty utility room, well labeled.

## 2014-11-20 NOTE — ED Notes (Signed)
Pt reports mechanical fall last week, tripped over dog and landed on concrete, states she's having low back pain radiating to her L leg. Denies LOC/head injury with fall. States pain has impacted her eating/sleeping. No B&B incontinence. Pt reports hx of vitamin D deficiency and gastric sleeve hx.

## 2014-11-20 NOTE — ED Provider Notes (Signed)
CSN: [id number]     Arrival date & time 11/20/14  2003 History  By signing my name below, I, [name], attest that this documentation has been prepared under the direction and in the presence of Debroah Baller, NP.  Electronically Signed: Tula [name], ED Scribe. 11/20/2014. 8:52 PM.   Chief Complaint  Patient presents with  . Back Pain   The history is provided by the patient. No language interpreter was used.    HPI Comments: [name] is a 39 y.o. female with a history of Vitamin D deficiency and Gastric Sleeve who presents to the Emergency Department complaining of constant, moderate lower back pain that radiates to her left legs and left toes and started 3 days ago. She states nausea as an associated symptom. Pt tried Hydrocodone-5 mg, prescribed by her orthopedic surgeon for chronic pain, 7 hours ago and Aleve with no relief. Pt reports that onset of pain started after she tripped over her dog and fell onto her bilateral knees. Pt was seen in the ED 2 days ago for the same, but states she is concerned because she did not get x-rays at that visit. She was prescribed prednisone at that visit. Pt reports that she called [facility] who advised her to come here and contact them for evaluation. Pt had Bariatric surgery 9 months ago and states she has lost 225 lbs. She denies bladder or bowel incontinence.   [facility]   Pt reports that her mother is picking her up from the ED.  Past Medical History  Diagnosis Date  . GERD (gastroesophageal reflux disease)   . Diverticulitis 2008    perforated/ requiring resection  . Colostomy in place   . Bipolar affective   . Depression   . Anxiety   . Asthma   . Arthritis   . Blood transfusion without reported diagnosis   . Substance abuse     12 years ago-crack cocaine  . Family history of adverse reaction to anesthesia     MGM was placed on vent after surgery  . Sleep apnea     mild, does not use c-pap machine  . Shortness of  breath dyspnea   . Headache   . Anemia    Past Surgical History  Procedure Laterality Date  . Exploratory laparotomy with resection  2008  . Colostomy reversal  11/2006  . Abdominal wall hernia  07/2007    open repair with lysis of adhesions  . Hx of abd wall seroma  08/2007    Drained via [place] in [place], [place]  . Hx of abd wall seroma  10/2007    Drained by Dr. Geroge Baseman in office  . Tonsillectomy    . Kidney stones    . Hernia repair    . Examination under anesthesia N/A 05/11/2012    Procedure: EXAM UNDER ANESTHESIA;  Surgeon: Donato Heinz, MD;  Location: [facility];  Service: General;  Laterality: N/A;  . Placement of seton N/A 05/11/2012    Procedure: PLACEMENT OF SETON;  Surgeon: Donato Heinz, MD;  Location: [facility];  Service: General;  Laterality: N/A;  . Treatment fistula anal    . Finger closed reduction Right 08/05/2012    Procedure: CLOSED REDUCTION RIGHT THUMB (FINGER);  Surgeon: Linna Hoff, MD;  Location: [facility];  Service: Orthopedics;  Laterality: Right;  . Esophagogastroduodenoscopy  03/13/2010    [id number] esophagus, status post passage of a Maloney dilator/Small hiatal hernia/ Antral erosions, status post biopsy  .  Colonoscopy  03/13/2010    NID:[id number] hemorrhoids likely cause of hematochezia, otherwise normal  . Laparoscopic gastric sleeve resection N/A 02/13/2014    Procedure: LAPAROSCOPIC GASTRIC SLEEVE RESECTION LYSIS OF ADHESIONS, UPPER ENDOSCOPY;  Surgeon: [name], MD;  Location: [facility];  Service: General;  Laterality: N/A;  . Root canal 7-16    . Laparoscopy N/A 09/28/2014    Procedure: LAPAROSCOPY DIAGNOSTIC, INCISION AND DRAINAGE WITH LAPAROSCOPIC EXPLORATION OF ABDOMINAL WALL SEROMA with ultrasound;  Surgeon: Greer Pickerel, MD;  Location: [facility];  Service: General;  Laterality: N/A;   Family History  Problem Relation Age of Onset  . Asthma Maternal Grandmother    Social History  Substance Use Topics  . Smoking status: Former Smoker -- 0.50  packs/day for 14 years    Types: E-cigarettes    Quit date: 12/20/2013  . Smokeless tobacco: Never Used     Comment: Vapor cigarettes.  . Alcohol Use: No   OB History    No data available     Review of Systems  Gastrointestinal: Positive for nausea.  Musculoskeletal: Positive for back pain.  Skin: Negative for wound.  All other systems reviewed and are negative.     Allergies  Review of patient's allergies indicates no known allergies.  Home Medications   Prior to Admission medications   Medication Sig Start Date End Date Taking? Authorizing Provider  Calcium Carbonate-Vitamin D (CALCIUM + D PO) Take 1 tablet by mouth daily.    Historical Provider, MD  clonazePAM (KLONOPIN) 1 MG tablet Take 1 mg by mouth 2 (two) times daily.     Historical Provider, MD  cyclobenzaprine (FLEXERIL) 5 MG tablet Take 1 tablet (5 mg total) by mouth 2 (two) times daily as needed for muscle spasms. 11/20/14   Hope Bunnie Pion, NP  esomeprazole (NEXIUM) 40 MG capsule Take 40 mg by mouth daily before lunch.    Historical Provider, MD  ferrous sulfate 325 (65 FE) MG tablet Take 650 mg by mouth 2 (two) times daily.     Historical Provider, MD  ibuprofen (ADVIL,MOTRIN) 200 MG tablet Take 800 mg by mouth every 6 (six) hours as needed for moderate pain.    Historical Provider, MD  lisdexamfetamine (VYVANSE) 40 MG capsule Take 40 mg by mouth daily.     Historical Provider, MD  Multiple Vitamin (MULTIVITAMIN WITH MINERALS) TABS tablet Take 1 tablet by mouth 2 (two) times daily.    Historical Provider, MD  Multiple Vitamins-Minerals (HAIR/SKIN/NAILS/BIOTIN) TABS Take 1 tablet by mouth 2 (two) times daily.    Historical Provider, MD  ondansetron (ZOFRAN ODT) 4 MG disintegrating tablet Take 1 tablet (4 mg total) by mouth every 8 (eight) hours as needed for nausea or vomiting. 09/28/14   Greer Pickerel, MD  oxyCODONE (ROXICODONE) 5 MG immediate release tablet Take 1 tablet (5 mg total) by mouth every 4 (four) hours as needed  for severe pain. Patient taking differently: Take 10-15 mg by mouth every 4 (four) hours as needed for severe pain.  10/01/14   Christopher Lawyer, PA-C  PERCOCET 5-325 MG per tablet Take 1 tablet by mouth every 6 (six) hours as needed for severe pain. Patient not taking: Reported on 10/06/2014 10/01/14   Dalia Heading, PA-C  predniSONE (DELTASONE) 20 MG tablet Take 2 tablets (40 mg total) by mouth daily. 11/19/14   Antonietta Breach, PA-C  promethazine (PHENERGAN) 12.5 MG tablet Take 1-2 tablets (12.5-25 mg total) by mouth every 8 (eight) hours as needed for nausea. Patient taking  differently: Take 12.5-25 mg by mouth every 6 (six) hours as needed for nausea or vomiting.  02/16/14   Alphonsa Overall, MD  QUEtiapine (SEROQUEL) 400 MG tablet Take 400 mg by mouth at bedtime.     Historical Provider, MD  traMADol (ULTRAM) 50 MG tablet Take 1 tablet (50 mg total) by mouth every 6 (six) hours as needed. Patient not taking: Reported on 10/06/2014 10/01/14   Dalia Heading, PA-C  valACYclovir (VALTREX) 500 MG tablet Take 500 mg by mouth daily as needed (fever blister).  03/01/13   Historical Provider, MD  zolpidem (AMBIEN) 10 MG tablet Take 10 mg by mouth at bedtime.     Historical Provider, MD   BP 162/89 mmHg  Pulse 103  Temp(Src) 98 F (36.7 C) (Oral)  Resp 18  SpO2 99% Physical Exam  Constitutional: She is oriented to person, place, and time. She appears well-developed and well-nourished. No distress.  HENT:  Head: Normocephalic and atraumatic.  Nose: Nose normal.  Eyes: Conjunctivae and EOM are normal.  Neck: Normal range of motion. Neck supple.  Cardiovascular: Normal rate and regular rhythm.   Pulmonary/Chest: Effort normal. She has no wheezes. She has no rales.  Abdominal: Soft.  Dressing over surgical site.  Musculoskeletal: Normal range of motion.       Lumbar back: She exhibits tenderness, pain and spasm. She exhibits no deformity and normal pulse.  Tenderness of lower lumbar spine with left  sciatica  Neurological: She is alert and oriented to person, place, and time. She has normal strength. No cranial nerve deficit or sensory deficit. Gait normal.  Reflex Scores:      Bicep reflexes are 2+ on the right side and 2+ on the left side.      Brachioradialis reflexes are 2+ on the right side and 2+ on the left side.      Patellar reflexes are 2+ on the right side and 2+ on the left side.      Achilles reflexes are 2+ on the right side and 2+ on the left side. Skin: Skin is warm and dry.  Psychiatric: She has a normal mood and affect. Her behavior is normal.  Nursing note and vitals reviewed.   ED Course  Procedures   DIAGNOSTIC STUDIES: Oxygen Saturation is 99% on RA, normal by my interpretation.    COORDINATION OF CARE: 9:02 PM Discussed treatment plan with pt which includes an x-ray of her lower back. Pt agreed to plan.  10:20 PM Discussed x-ray results with pt. Pt notes improvement in her symptoms with Flexeril. Pt to follow-up with orthopedist. Will prescribe Flexeril. She agreed to plan.   Labs Review Labs Reviewed - No data to display  Imaging Review Dg Lumbar Spine Complete  11/20/2014   EXAM: LUMBAR SPINE - COMPLETE 4+ VIEW  COMPARISON:  None.  FINDINGS: There is no evidence of lumbar spine fracture. Alignment is normal. Minimal degenerative disc disease at L4-5 and L5-S1.  There is a T-type IUD noted in the pelvis.  IMPRESSION: No acute osseous injury of the lumbar spine.   Electronically Signed   By: Kathreen Devoid   On: 11/20/2014 21:50   I have personally reviewed and evaluated these images as part of my medical decision-making.   MDM  39 y.o. female with low back pain s/p fall. Stable for d/c without focal neuro deficits and normal x-ray. Patient to follow up with ortho. Discussed with the patient and all questioned fully answered.  Final diagnoses:  Midline low  back pain with left-sided sciatica   I personally performed the services described in this  documentation, which was scribed in my presence. The recorded information has been reviewed and is accurate.    Ashley Murrain, NP 11/20/14 [place], DO 11/20/14 2311

## 2014-11-20 NOTE — ED Notes (Signed)
Pt reports that she is a patient of [facility] asking to contact provider on call.

## 2014-11-21 DIAGNOSIS — F312 Bipolar disorder, current episode manic severe with psychotic features: Secondary | ICD-10-CM | POA: Diagnosis not present

## 2014-11-22 ENCOUNTER — Encounter: Payer: Medicare Other | Attending: Internal Medicine | Admitting: Dietician

## 2014-11-22 DIAGNOSIS — D509 Iron deficiency anemia, unspecified: Secondary | ICD-10-CM | POA: Diagnosis not present

## 2014-11-22 DIAGNOSIS — Z6832 Body mass index (BMI) 32.0-32.9, adult: Secondary | ICD-10-CM | POA: Diagnosis not present

## 2014-11-22 DIAGNOSIS — E669 Obesity, unspecified: Secondary | ICD-10-CM | POA: Insufficient documentation

## 2014-11-22 DIAGNOSIS — Z9884 Bariatric surgery status: Secondary | ICD-10-CM | POA: Diagnosis not present

## 2014-11-22 DIAGNOSIS — I10 Essential (primary) hypertension: Secondary | ICD-10-CM | POA: Insufficient documentation

## 2014-11-22 DIAGNOSIS — Z713 Dietary counseling and surveillance: Secondary | ICD-10-CM | POA: Insufficient documentation

## 2014-11-22 DIAGNOSIS — K219 Gastro-esophageal reflux disease without esophagitis: Secondary | ICD-10-CM | POA: Diagnosis not present

## 2014-11-22 DIAGNOSIS — Z48815 Encounter for surgical aftercare following surgery on the digestive system: Secondary | ICD-10-CM | POA: Insufficient documentation

## 2014-11-22 NOTE — Patient Instructions (Addendum)
-  Eat something with protein at least 3x a day -Work on taking 3 calciums per day and 2 vitamins per day  -Try Citracal Petites and Flintstones complete  -Drink more water and less sodas and other sugary drinks  -Chicken  -Beans -Ground beef (meat sauce, meat balls) -Low fat cheese (Laughing cow) -[place] sausage -Eggs

## 2014-11-22 NOTE — Progress Notes (Signed)
  Follow-up visit:  7 months Post-Operative Gastric Sleeve Surgery  Medical Nutrition Therapy:  Appt start time: 3338 end time: 1115  Primary concerns today: Post-operative Bariatric Surgery Nutrition Management. [name] returns today having lost another 5 pounds. She reports having "mini bipolar episodes because my medicine hasn't been right." Eating a lot of sweets lately. Skips meals frequently, often lunch. Iron levels have improved. Not having any vomiting or diarrhea. She verbalizes that she is not meeting her protein needs and understands that it is recommended that she eat 60 grams of protein daily. Min states that personal stress causes her to be unable to eat. She reports that she no longer likes protein shakes.   Surgery date: 02/13/2014 Surgery type: Sleeve [facility] weight at [facility]: 314 lbs on 06/30/2013 Weight today: 209.7 lbs Weight change: 5 lbs Total weight lost: 105.5 lbs  TANITA  BODY COMP RESULTS  01/29/14 02/27/14 04/11/14 05/11/14 09/06/14 11/22/14   BMI (kg/m^2) 48.7 44.9 41.1 39 32.6 N/A   Fat Mass (lbs) 171.5 157.0 138.5 122.5 87.5    Fat Free Mass (lbs) 148.5 138.0 132 134 127    Total Body Water (lbs) 108.5 101.0 96.5 98 93     Preferred Learning Style:   No preference indicated   Learning Readiness:   Ready  24-hr recall: B: bacon, egg, and cheese biscuit L: cheese crackers D: 3 oz chicken and sweet potato from [employer]  Fluid intake: 64 ounces (Coke, Sunny Delite, [place], juice, chocolate milk, water "at night with medicines") Estimated total protein intake: difficult to assess  Medications: see list Supplementation: taking "sometimes"  Drinking while eating: no Hair loss: yes, severe Carbonated beverages: diet sodas  Recent physical activity:  none  Progress Towards Goal(s):  Some progress.   Nutritional Diagnosis:  [place]-3.3 Overweight/obesity related to past poor dietary habits and physical inactivity as evidenced by patient w/ recent gastric sleeve  surgery following dietary guidelines for continued weight loss.  Intervention:  Nutrition counseling provided. Encouraged the patient to continue eating protein foods at least 3x a day. Also encouraged regular, sufficient vitamin/mineral supplement intake. Discussed appropriate fluids and encouraged 64 ounces daily.      Teaching Method Utilized:  Visual Auditory Hands on  Barriers to learning/adherence to lifestyle change: unable to meet protein needs  Demonstrated degree of understanding via:  Teach Back   Monitoring/Evaluation:  Dietary intake, exercise, and body weight. Follow up in 6 weeks for 8.5 month post-op visit.

## 2014-11-23 ENCOUNTER — Encounter: Payer: Self-pay | Admitting: Dietician

## 2014-11-24 ENCOUNTER — Telehealth: Payer: Self-pay | Admitting: General Surgery

## 2014-11-24 NOTE — Telephone Encounter (Signed)
Patient called stating she was having lower back pain radiating to her legs.  Requesting narcotics.  Discussed with the patient that we do not treat back pain and that did not sound like it was related to her surgery.  Recommended she discuss with her PCP.

## 2014-11-25 ENCOUNTER — Encounter (HOSPITAL_COMMUNITY): Payer: Self-pay | Admitting: *Deleted

## 2014-11-25 ENCOUNTER — Emergency Department (HOSPITAL_COMMUNITY)
Admission: EM | Admit: 2014-11-25 | Discharge: 2014-11-25 | Disposition: A | Discharge status: Home / Self Care | Payer: Medicare Other | Attending: Emergency Medicine | Admitting: Emergency Medicine

## 2014-11-25 DIAGNOSIS — Z7952 Long term (current) use of systemic steroids: Secondary | ICD-10-CM | POA: Insufficient documentation

## 2014-11-25 DIAGNOSIS — J45909 Unspecified asthma, uncomplicated: Secondary | ICD-10-CM | POA: Diagnosis not present

## 2014-11-25 DIAGNOSIS — D649 Anemia, unspecified: Secondary | ICD-10-CM | POA: Diagnosis not present

## 2014-11-25 DIAGNOSIS — M1990 Unspecified osteoarthritis, unspecified site: Secondary | ICD-10-CM | POA: Insufficient documentation

## 2014-11-25 DIAGNOSIS — M545 Low back pain: Secondary | ICD-10-CM | POA: Diagnosis not present

## 2014-11-25 DIAGNOSIS — M5136 Other intervertebral disc degeneration, lumbar region: Secondary | ICD-10-CM

## 2014-11-25 DIAGNOSIS — Z87891 Personal history of nicotine dependence: Secondary | ICD-10-CM | POA: Diagnosis not present

## 2014-11-25 DIAGNOSIS — K219 Gastro-esophageal reflux disease without esophagitis: Secondary | ICD-10-CM | POA: Insufficient documentation

## 2014-11-25 DIAGNOSIS — M549 Dorsalgia, unspecified: Secondary | ICD-10-CM | POA: Diagnosis not present

## 2014-11-25 DIAGNOSIS — F319 Bipolar disorder, unspecified: Secondary | ICD-10-CM | POA: Insufficient documentation

## 2014-11-25 DIAGNOSIS — Z79899 Other long term (current) drug therapy: Secondary | ICD-10-CM | POA: Insufficient documentation

## 2014-11-25 DIAGNOSIS — F419 Anxiety disorder, unspecified: Secondary | ICD-10-CM | POA: Insufficient documentation

## 2014-11-25 MED ORDER — PREDNISONE 10 MG PO TABS: ORAL_TABLET | ORAL | Status: DC

## 2014-11-25 MED ORDER — BACLOFEN 10 MG PO TABS: 10.0000 mg | ORAL_TABLET | Freq: Three times a day (TID) | ORAL | Status: AC

## 2014-11-25 NOTE — ED Notes (Signed)
PA at bedside.

## 2014-11-25 NOTE — ED Notes (Signed)
Pt comes in with lower left back pain that shoots down her leg. Pt states she had a fall last week and has been doing new workouts which have made her pain increase. NAD noted.

## 2014-11-25 NOTE — ED Provider Notes (Signed)
CSN: [id number]     Arrival date & time 11/25/14  1040 History   First MD Initiated Contact with Patient 11/25/14 1103     Chief Complaint  Patient presents with  . Back Pain     (Consider location/radiation/quality/duration/timing/severity/associated sxs/prior Treatment) HPI Comments: Patient is a 39 year old female who presents to the emergency department with complaint of lower back pain. The patient has a history of vitamin D deficiency, gastric sleeve surgery, with a loss of over 200 pounds. On September 26, the patient sustained a fall after tripping over her dog. She complained of some lower back pain area she was evaluated, and placed on osteoid type medication. The patient was then seen on September 27. She continued to have problems with pain and spasm. She received an x-ray which showed some mild degenerative disc disease at the L4-L5, and L5-S1 area, but there was no fracture or dislocation noted. The patient was placed on muscle relaxing medication and advised to follow-up with her orthopedic specialist. The patient returns today because of lower left back pain that goes down the left leg. She states that along with the fall that she had on the previous week, she has been doing a new workout routine and that her pain has increased. She presents now for evaluation and assistance with this discomfort. She's not had any high fever, no change in urination, no direct injury to the back. There's been no loss of bowel or bladder function.  Patient is a 39 y.o. female presenting with back pain. The history is provided by the patient.  Back Pain   Past Medical History  Diagnosis Date  . GERD (gastroesophageal reflux disease)   . Diverticulitis 2008    perforated/ requiring resection  . Colostomy in place [facility])   . Bipolar affective ([place])   . Depression   . Anxiety   . Asthma   . Arthritis   . Blood transfusion without reported diagnosis   . Substance abuse     12 years ago-crack  cocaine  . Family history of adverse reaction to anesthesia     MGM was placed on vent after surgery  . Sleep apnea     mild, does not use c-pap machine  . Shortness of breath dyspnea   . Headache   . Anemia    Past Surgical History  Procedure Laterality Date  . Exploratory laparotomy with resection  2008  . Colostomy reversal  11/2006  . Abdominal wall hernia  07/2007    open repair with lysis of adhesions  . Hx of abd wall seroma  08/2007    Drained via [place] in [place], [place]  . Hx of abd wall seroma  10/2007    Drained by Dr. Geroge Baseman in office  . Tonsillectomy    . Kidney stones    . Hernia repair    . Examination under anesthesia N/A 05/11/2012    Procedure: EXAM UNDER ANESTHESIA;  Surgeon: Donato Heinz, MD;  Location: [facility];  Service: General;  Laterality: N/A;  . Placement of seton N/A 05/11/2012    Procedure: PLACEMENT OF SETON;  Surgeon: Donato Heinz, MD;  Location: [facility];  Service: General;  Laterality: N/A;  . Treatment fistula anal    . Finger closed reduction Right 08/05/2012    Procedure: CLOSED REDUCTION RIGHT THUMB (FINGER);  Surgeon: Linna Hoff, MD;  Location: [facility];  Service: Orthopedics;  Laterality: Right;  . Esophagogastroduodenoscopy  03/13/2010    BZM:[id number] esophagus, status post passage  of a Maloney dilator/Small hiatal hernia/ Antral erosions, status post biopsy  . Colonoscopy  03/13/2010    QIW:[id number] hemorrhoids likely cause of hematochezia, otherwise normal  . Laparoscopic gastric sleeve resection N/A 02/13/2014    Procedure: LAPAROSCOPIC GASTRIC SLEEVE RESECTION LYSIS OF ADHESIONS, UPPER ENDOSCOPY;  Surgeon: [name], MD;  Location[facility];  Service: General;  Laterality: N/A;  . Root canal 7-16    . Laparoscopy N/A 09/28/2014    Procedure: LAPAROSCOPY DIAGNOSTIC, INCISION AND DRAINAGE WITH LAPAROSCOPIC EXPLORATION OF ABDOMINAL WALL SEROMA with ultrasound;  Surgeon: Greer Pickerel, MD;  [facility];  Service: General;  Laterality:  N/A;   Family History  Problem Relation Age of Onset  . Asthma Maternal Grandmother    Social History  Substance Use Topics  . Smoking status: Former Smoker -- 0.50 packs/day for 14 years    Types: E-cigarettes    Quit date: 12/20/2013  . Smokeless tobacco: Never Used     Comment: Vapor cigarettes.  . Alcohol Use: No   OB History    No data available     Review of Systems  Musculoskeletal: Positive for back pain.  All other systems reviewed and are negative.     Allergies  Tylenol  Home Medications   Prior to Admission medications   Medication Sig Start Date End Date Taking? Authorizing Provider  Calcium Carbonate-Vitamin D (CALCIUM + D PO) Take 1 tablet by mouth daily.    Historical Provider, MD  clonazePAM (KLONOPIN) 1 MG tablet Take 1 mg by mouth 2 (two) times daily.     Historical Provider, MD  cyclobenzaprine (FLEXERIL) 5 MG tablet Take 1 tablet (5 mg total) by mouth 2 (two) times daily as needed for muscle spasms. 11/20/14   Hope Bunnie Pion, NP  esomeprazole (NEXIUM) 40 MG capsule Take 40 mg by mouth daily before lunch.    Historical Provider, MD  ferrous sulfate 325 (65 FE) MG tablet Take 650 mg by mouth 2 (two) times daily.     Historical Provider, MD  ibuprofen (ADVIL,MOTRIN) 200 MG tablet Take 800 mg by mouth every 6 (six) hours as needed for moderate pain.    Historical Provider, MD  lisdexamfetamine (VYVANSE) 40 MG capsule Take 40 mg by mouth daily.     Historical Provider, MD  Multiple Vitamin (MULTIVITAMIN WITH MINERALS) TABS tablet Take 1 tablet by mouth 2 (two) times daily.    Historical Provider, MD  Multiple Vitamins-Minerals (HAIR/SKIN/NAILS/BIOTIN) TABS Take 1 tablet by mouth 2 (two) times daily.    Historical Provider, MD  ondansetron (ZOFRAN ODT) 4 MG disintegrating tablet Take 1 tablet (4 mg total) by mouth every 8 (eight) hours as needed for nausea or vomiting. 09/28/14   Greer Pickerel, MD  oxyCODONE (ROXICODONE) 5 MG immediate release tablet Take 1 tablet  (5 mg total) by mouth every 4 (four) hours as needed for severe pain. Patient taking differently: Take 10-15 mg by mouth every 4 (four) hours as needed for severe pain.  10/01/14   Christopher Lawyer, PA-C  PERCOCET 5-325 MG per tablet Take 1 tablet by mouth every 6 (six) hours as needed for severe pain. Patient not taking: Reported on 10/06/2014 10/01/14   Dalia Heading, PA-C  predniSONE (DELTASONE) 20 MG tablet Take 2 tablets (40 mg total) by mouth daily. 11/19/14   Antonietta Breach, PA-C  promethazine (PHENERGAN) 12.5 MG tablet Take 1-2 tablets (12.5-25 mg total) by mouth every 8 (eight) hours as needed for nausea. Patient taking differently: Take 12.5-25 mg  by mouth every 6 (six) hours as needed for nausea or vomiting.  02/16/14   Alphonsa Overall, MD  QUEtiapine (SEROQUEL) 400 MG tablet Take 400 mg by mouth at bedtime.     Historical Provider, MD  traMADol (ULTRAM) 50 MG tablet Take 1 tablet (50 mg total) by mouth every 6 (six) hours as needed. Patient not taking: Reported on 10/06/2014 10/01/14   Dalia Heading, PA-C  valACYclovir (VALTREX) 500 MG tablet Take 500 mg by mouth daily as needed (fever blister).  03/01/13   Historical Provider, MD  zolpidem (AMBIEN) 10 MG tablet Take 10 mg by mouth at bedtime.     Historical Provider, MD   BP 146/87 mmHg  Pulse 105  Temp(Src) 97.4 F (36.3 C) (Oral)  Resp 16  Ht 5\' 8"  (1.727 m)  Wt 205 lb (92.987 kg)  BMI 31.18 kg/m2  SpO2 100% Physical Exam  Constitutional: She is oriented to person, place, and time. She appears well-developed and well-nourished.  Non-toxic appearance.  HENT:  Head: Normocephalic.  Right Ear: Tympanic membrane and external ear normal.  Left Ear: Tympanic membrane and external ear normal.  Eyes: EOM and lids are normal. Pupils are equal, round, and reactive to light.  Neck: Normal range of motion. Neck supple. Carotid bruit is not present.  Cardiovascular: Normal rate, regular rhythm, normal heart sounds, intact distal pulses and  normal pulses.   Pulmonary/Chest: Breath sounds normal. No respiratory distress.  Abdominal: Soft. Bowel sounds are normal. There is no tenderness. There is no guarding.  Musculoskeletal: Normal range of motion.       Lumbar back: She exhibits tenderness, pain and spasm. She exhibits no deformity.  Lymphadenopathy:       Head (right side): No submandibular adenopathy present.       Head (left side): No submandibular adenopathy present.    She has no cervical adenopathy.  Neurological: She is alert and oriented to person, place, and time. She has normal strength. No cranial nerve deficit or sensory deficit.  Skin: Skin is warm and dry.  Psychiatric: She has a normal mood and affect. Her speech is normal.  Nursing note and vitals reviewed.   ED Course  Procedures (including critical care time) Labs Review Labs Reviewed - No data to display  Imaging Review No results found. I have personally reviewed and evaluated these images and lab results as part of my medical decision-making.   EKG Interpretation None      MDM  Vital signs reviewed. No gross neurologic deficit appreciated. No evidence for caudal equina. Filled it is safe for the patient to be discharged home with close outpatient follow-up and evaluation. Prescription for prednisone and baclofen given to the patient for assistance with discomfort.    Final diagnoses:  None    **I have reviewed nursing notes, vital signs, and all appropriate lab and imaging results for this patient.Lily Kocher, PA-C 11/25/14 1405  Milton Ferguson, MD 11/26/14 1228

## 2014-11-25 NOTE — Discharge Instructions (Signed)
Your previous x-ray revealed some degenerative changes involving her back. It is very important that you see your orthopedic specialist as sone as possible for additional evaluation, and possible MRI evaluation concerning your back and your recurrent back pains. Please use baclofen with caution, as this medication may cause drowsiness. Degenerative Disk Disease Degenerative disk disease is a condition caused by the changes that occur in the cushions of the backbone (spinal disks) as you grow older. Spinal disks are soft and compressible disks located between the bones of the spine (vertebrae). They act like shock absorbers. Degenerative disk disease can affect the whole spine. However, the neck and lower back are most commonly affected. Many changes can occur in the spinal disks with aging, such as:  The spinal disks may dry and shrink.  Small tears may occur in the tough, outer covering of the disk (annulus).  The disk space may become smaller due to loss of water.  Abnormal growths in the bone (spurs) may occur. This can put pressure on the nerve roots exiting the spinal canal, causing pain.  The spinal canal may become narrowed. CAUSES  Degenerative disk disease is a condition caused by the changes that occur in the spinal disks with aging. The exact cause is not known, but there is a genetic basis for many patients. Degenerative changes can occur due to loss of fluid in the disk. This makes the disk thinner and reduces the space between the backbones. Small cracks can develop in the outer layer of the disk. This can lead to the breakdown of the disk. You are more likely to get degenerative disk disease if you are overweight. Smoking cigarettes and doing heavy work such as weightlifting can also increase your risk of this condition. Degenerative changes can start after a sudden injury. Growth of bone spurs can compress the nerve roots and cause pain.  SYMPTOMS  The symptoms vary from person to  person. Some people may have no pain, while others have severe pain. The pain may be so severe that it can limit your activities. The location of the pain depends on the part of your backbone that is affected. You will have neck or arm pain if a disk in the neck area is affected. You will have pain in your back, buttocks, or legs if a disk in the lower back is affected. The pain becomes worse while bending, reaching up, or with twisting movements. The pain may start gradually and then get worse as time passes. It may also start after a major or minor injury. You may feel numbness or tingling in the arms or legs.  DIAGNOSIS  Your caregiver will ask you about your symptoms and about activities or habits that may cause the pain. He or she may also ask about any injuries, diseases, or treatments you have had earlier. Your caregiver will examine you to check for the range of movement that is possible in the affected area, to check for strength in your extremities, and to check for sensation in the areas of the arms and legs supplied by different nerve roots. An X-ray of the spine may be taken. Your caregiver may suggest other imaging tests, such as magnetic resonance imaging (MRI), if needed.  TREATMENT  Treatment includes rest, modifying your activities, and applying ice and heat. Your caregiver may prescribe medicines to reduce your pain and may ask you to do some exercises to strengthen your back. In some cases, you may need surgery. You and your caregiver will  decide on the treatment that is best for you. HOME CARE INSTRUCTIONS   Follow proper lifting and walking techniques as advised by your caregiver.  Maintain good posture.  Exercise regularly as advised.  Perform relaxation exercises.  Change your sitting, standing, and sleeping habits as advised. Change positions frequently.  Lose weight as advised.  Stop smoking if you smoke.  Wear supportive footwear. SEEK MEDICAL CARE IF:  Your pain  does not go away within 1 to 4 weeks. SEEK IMMEDIATE MEDICAL CARE IF:   Your pain is severe.  You notice weakness in your arms, hands, or legs.  You begin to lose control of your bladder or bowel movements. MAKE SURE YOU:   Understand these instructions.  Will watch your condition.  Will get help right away if you are not doing well or get worse. Document Released: 12/07/2006 Document Revised: 05/04/2011 Document Reviewed: 06/13/2013 [facility] Patient Information 2015 [place], [place]. This information is not intended to replace advice given to you by your health care provider. Make sure you discuss any questions you have with your health care provider.

## 2014-11-26 ENCOUNTER — Telehealth: Payer: Self-pay | Admitting: *Deleted

## 2014-11-26 DIAGNOSIS — M545 Low back pain: Secondary | ICD-10-CM | POA: Diagnosis not present

## 2014-11-26 NOTE — Telephone Encounter (Signed)
Pt left message on Friday - "hurting in her lungs, needs chemo pills and I'm not healthy". Returned pt's call - no answer; left message to call back if needed. Noted in EPIC , she went to [facility] ED yesterday.

## 2014-11-28 DIAGNOSIS — F308 Other manic episodes: Secondary | ICD-10-CM | POA: Diagnosis not present

## 2014-12-05 DIAGNOSIS — M545 Low back pain: Secondary | ICD-10-CM | POA: Diagnosis not present

## 2014-12-11 ENCOUNTER — Encounter: Payer: Self-pay | Admitting: Family Medicine

## 2014-12-13 ENCOUNTER — Encounter: Payer: Self-pay | Admitting: Physician Assistant

## 2014-12-13 ENCOUNTER — Ambulatory Visit (INDEPENDENT_AMBULATORY_CARE_PROVIDER_SITE_OTHER): Payer: Medicare Other | Admitting: Physician Assistant

## 2014-12-13 VITALS — BP 120/74 | HR 96 | Temp 97.6°F | Resp 18 | Ht 64.5 in | Wt 220.0 lb

## 2014-12-13 DIAGNOSIS — M5137 Other intervertebral disc degeneration, lumbosacral region: Secondary | ICD-10-CM

## 2014-12-13 DIAGNOSIS — T888XXS Other specified complications of surgical and medical care, not elsewhere classified, sequela: Secondary | ICD-10-CM

## 2014-12-13 DIAGNOSIS — Z9884 Bariatric surgery status: Secondary | ICD-10-CM | POA: Diagnosis not present

## 2014-12-13 DIAGNOSIS — T792XXS Traumatic secondary and recurrent hemorrhage and seroma, sequela: Secondary | ICD-10-CM

## 2014-12-13 DIAGNOSIS — F17200 Nicotine dependence, unspecified, uncomplicated: Secondary | ICD-10-CM

## 2014-12-13 DIAGNOSIS — F319 Bipolar disorder, unspecified: Secondary | ICD-10-CM | POA: Diagnosis not present

## 2014-12-13 DIAGNOSIS — Z720 Tobacco use: Secondary | ICD-10-CM | POA: Diagnosis not present

## 2014-12-13 DIAGNOSIS — D508 Other iron deficiency anemias: Secondary | ICD-10-CM

## 2014-12-13 DIAGNOSIS — F411 Generalized anxiety disorder: Secondary | ICD-10-CM

## 2014-12-13 DIAGNOSIS — S301XXS Contusion of abdominal wall, sequela: Secondary | ICD-10-CM

## 2014-12-13 DIAGNOSIS — M5136 Other intervertebral disc degeneration, lumbar region: Secondary | ICD-10-CM | POA: Insufficient documentation

## 2014-12-13 DIAGNOSIS — E781 Pure hyperglyceridemia: Secondary | ICD-10-CM | POA: Diagnosis not present

## 2014-12-13 DIAGNOSIS — E669 Obesity, unspecified: Secondary | ICD-10-CM | POA: Diagnosis not present

## 2014-12-13 DIAGNOSIS — F3160 Bipolar disorder, current episode mixed, unspecified: Secondary | ICD-10-CM

## 2014-12-13 DIAGNOSIS — Z23 Encounter for immunization: Secondary | ICD-10-CM

## 2014-12-13 MED ORDER — TRAMADOL HCL 50 MG PO TABS: 50.0000 mg | ORAL_TABLET | Freq: Three times a day (TID) | ORAL | Status: DC | PRN

## 2014-12-13 NOTE — Progress Notes (Addendum)
Patient ID: [name] MRN: [id number], DOB: August 11, 1975, 39 y.o. Date of Encounter: @DATE @  Chief Complaint:  Chief Complaint  Patient presents with  . new pt est care    discuss meds  . Medication Refill    tramadol    HPI: 39 y.o. year old white female  presents as a new patient to establish care here.  Her mother, [name], has been a patient of mine for years. Unfortunately, [name]'s husband passed away in the past couple of years secondary to leukemia.  Patient lives with her mother. Her mother helps care for her as best she can. Also in the house, is patient's son who I just saw as a new patient to establish care here. He is 38 years old.(He is mixed black/white--his name is [name])  Today patient also tells me that she has a daughter who is now 38 years old named Chad. She says that back then after giving birth to [name], pt was involved in drugs, etc---therefore, patient's parents raised [name].  These are the only children she has had.  Patient makes reference to prior history of repeated drug abuse. She also discusses that she has "severe case of Bipolar Disorder--- and that it is so bad that if she does not take her medicines exactly as directed and to see her psychiatrist as frequently as they state that she will have to be in an institution."  Says that she had a recent "breakdown and was "inpatient ". Says this was beginning of September. Says she stayed inpatient for 7 days. Says because of this recent "breakdown and inpatient status "she currently is seeing her psychiatrist frequently,-- once a week. Also makes reference to having "mini bipolar episodes " currently/recently.   She also discusses the fact that her psychiatrist has documentation so that he is allowed to talk to all of her doctors and medical providers. She says that for all medications prescribed, we have to ask him if it is okay for her to take those medications in combination with  her psych medications.  When discussing her past drug abuse, she talks about that "drugs really messed up her brain and that the combination of that and her severe bipolar disorder make her very difficult to control and almost has to be in an institution and that she knows that if she does not carefully follow with the psychiatrist says is necessary, that this is what will happen."  Her psychiatrist is [name]. She says that he works in the hospital with the inpatients all days except for 1 day a week he is in the office and that she has been a patient of his for many years.  At the end of today's visit, she also makes reference to fact that "I know I am on my last chance with him and if I mess things up again, he won't keep helping me and being my doctor".  She has had multiple abdominal surgeries.(We did not get into any specific details regarding this today and will further discuss this at follow-up visits.)  She is seeing [name] at [facility] regarding her most recent abdominal surgery.  She says that she is also seeing Dr. [name] at [facility] . Says she sees him regarding her herniated disc in her lumbar spine.  She says that her Psychiatrist, Dr. Sheppard [name], "has been helping her so she will not have to go to the emergency room." Says that [name] prescribed her some tramadol  and that the bottle she has with her today was the second bottle of tramadol that he has prescribed for her. She says that [name] is out of town. Says that he left yesterday. Says that she knows he will be back in town in time for her next appointment with him, which she says is Wednesday, October 26. She says that she took the last tablet of the tramadol in this bottle this morning. She has that empty bottle with her and it was prescribed on 12/05/14 for #12 (twelve).  She says that "you have to be very careful about what kind of medicines you mix with the psych medicines [she] is on".  And "have to be careful with what medicines you mix with people with bipolar". Says that in the past, after her surgery, she was given oxycodone. Says that "people who are bipolar and who take Percocet- it makes them "see things and hear things "."  Says that's why she had to be hospitalized-- was for detoxification off of this.   ADDENDUM ADDED 12/14/2014: Received OV Note from [name] at [facility].  " S/P Bariatric Surgery. ---underwent laparoscopic sleeve gastrectomy with lysis of adhesions for 1 hour on Dec 22,2015." "Her course has been complicated.  After her sleeve gastrectomy she developed a recurrent fluid--a chronic abdominal wall seroma from her prior open ventral hernia repair done at an outside hospital.  She has had a Hartman procedure for perforated diverticulitis follwoed by colostomy takedown followed by open repair of incisional hernia which was complicated by a seroma which had to be drained--all of this done at an outside [facility] 6 years ago. The fluid in her seroma has been persistnat.  We have percutaneously drained twice.  We had to remove a drain early secondary to pt discomfort." "I eneded up taking her back to the OR 09/27/14. Her sleeve appeared normal. There were some recurrnet adhesions in her midline. I also did I&D and laparoscopic exploration of hte abdominal wall seroma. Placed talc powder into seroma cavity to try to get it to scar down and stop producing fluid.  She has had ongoing issues since then.  She has called the office numerous times.  She has gone to the ER. The small incision mad to incise and drain the seroma apparently was opened up.   His note does include that she was hospitalized in [facility] as a Inpatient.  [name] note states that he spoke with her Psychiatrist.  At this [place] 12/13/14, he noted that she had not called their office asking for pain meds.  At that [place], she reported making some bad  food choices. Has been eating sweets. Had bowl of icecream. Not exercising.  At that [place] he rediscussed proper eating techniques and importantce of supplemenst and proper food choices.   He asked her to make commitment of going to [facility] 2 times a week b/t now and next OV.   He noted she had lost 90 lbs since surgery. But, she had gained 10 lbs since his last/prior OV with her.   His note also mentions that at that [place] 12/13/14 her behavior was typical for her and better than it had been at the last 2 appointments."   Past Medical History  Diagnosis Date  . GERD (gastroesophageal reflux disease)   . Diverticulitis 2008    perforated/ requiring resection  . Colostomy in place [facility])   . Bipolar affective ([place])   . Depression   .  Anxiety   . Asthma   . Arthritis   . Blood transfusion without reported diagnosis   . Substance abuse     12 years ago-crack cocaine  . Family history of adverse reaction to anesthesia     MGM was placed on vent after surgery  . Sleep apnea     mild, does not use c-pap machine  . Shortness of breath dyspnea   . Headache   . Anemia      Home Meds: Outpatient Prescriptions Prior to Visit  Medication Sig Dispense Refill  . baclofen (LIORESAL) 10 MG tablet Take 1 tablet (10 mg total) by mouth 3 (three) times daily. 21 each 0  . Calcium Carbonate-Vitamin D (CALCIUM + D PO) Take 1 tablet by mouth daily.    . clonazePAM (KLONOPIN) 1 MG tablet Take 1 mg by mouth 2 (two) times daily.     . cyclobenzaprine (FLEXERIL) 5 MG tablet Take 1 tablet (5 mg total) by mouth 2 (two) times daily as needed for muscle spasms. 10 tablet 0  . ferrous sulfate 325 (65 FE) MG tablet Take 650 mg by mouth 2 (two) times daily.     . haloperidol (HALDOL) 5 MG tablet 1 tab in AM and 3 tab at HS    . ibuprofen (ADVIL,MOTRIN) 200 MG tablet Take 800 mg by mouth every 6 (six) hours as needed for moderate pain.    . Multiple Vitamins-Minerals (HAIR/SKIN/NAILS/BIOTIN) TABS Take 1 tablet by mouth  2 (two) times daily.    Marland Kitchen NUVIGIL 250 MG tablet Take 250 mg by mouth daily.  5  . ondansetron (ZOFRAN ODT) 4 MG disintegrating tablet Take 1 tablet (4 mg total) by mouth every 8 (eight) hours as needed for nausea or vomiting. 40 tablet 0  . predniSONE (DELTASONE) 10 MG tablet 5,4,3,2,1 - take with food 15 tablet 0  . QUEtiapine (SEROQUEL) 300 MG tablet 2 hs    . traMADol (ULTRAM) 50 MG tablet Take 1 tablet (50 mg total) by mouth every 6 (six) hours as needed. 10 tablet 0  . valACYclovir (VALTREX) 500 MG tablet Take 500 mg by mouth daily as needed (fever blister).     Marland Kitchen zolpidem (AMBIEN) 10 MG tablet Take 10 mg by mouth at bedtime.     . benztropine (COGENTIN) 1 MG tablet Take 0.5 mg by mouth.    . oxyCODONE (ROXICODONE) 5 MG immediate release tablet Take 1 tablet (5 mg total) by mouth every 4 (four) hours as needed for severe pain. (Patient not taking: Reported on 12/13/2014) 15 tablet 0  . PERCOCET 5-325 MG per tablet Take 1 tablet by mouth every 6 (six) hours as needed for severe pain. (Patient not taking: Reported on 12/13/2014) 12 tablet 0  . promethazine (PHENERGAN) 12.5 MG tablet Take 1-2 tablets (12.5-25 mg total) by mouth every 8 (eight) hours as needed for nausea. (Patient not taking: Reported on 11/25/2014) 20 tablet 0  . sulfamethoxazole-trimethoprim (BACTRIM,SEPTRA) 400-80 MG tablet Take 1 tablet by mouth 2 (two) times daily.     No facility-administered medications prior to visit.    Allergies:  Allergies  Allergen Reactions  . Nsaids   . Tylenol [Acetaminophen] Nausea And Vomiting    Social History   Social History  . Marital Status: Single    Spouse Name: N/A  . Number of Children: 2  . Years of Education: N/A   Occupational History  . Dental Assistant    Social History Main Topics  . Smoking status: Former  Smoker -- 0.50 packs/day for 14 years    Types: E-cigarettes    Quit date: 12/20/2013  . Smokeless tobacco: Never Used     Comment: Vapor cigarettes.  .  Alcohol Use: No  . Drug Use: No  . Sexual Activity: No   Other Topics Concern  . Not on file   Social History Narrative    Family History  Problem Relation Age of Onset  . Asthma Maternal Grandmother      Review of Systems:  See HPI for pertinent ROS. All other ROS negative.    Physical Exam: Blood pressure 120/74, pulse 96, temperature 97.6 F (36.4 C), temperature source Oral, resp. rate 18, height 5' 4.5" (1.638 m), weight 220 lb (99.791 kg)., Body mass index is 37.19 kg/(m^2). General: Overweight WF. Appears in no acute distress. Neck: Supple. No thyromegaly. No lymphadenopathy. Lungs: Clear bilaterally to auscultation without wheezes, rales, or rhonchi. Breathing is unlabored. Heart: RRR with S1 S2. No murmurs, rubs, or gallops. Musculoskeletal:  Strength and tone normal for age. Extremities/Skin: Warm and dry.  Neuro: Alert and oriented X 3. Moves all extremities spontaneously. Gait is normal. CNII-XII grossly in tact. Psych:  Responds to questions appropriately with a normal affect.     ASSESSMENT AND PLAN:  39 y.o. year old female with  1. Degeneration of lumbar or lumbosacral intervertebral disc - traMADol (ULTRAM) 50 MG tablet; Take 1 tablet (50 mg total) by mouth every 8 (eight) hours as needed.  Dispense: 12 tablet; Refill: 0  2. Abdominal wall seroma, sequela --Per [name] at [facility]  3. Bipolar affective disorder, current episode mixed, current episode severity unspecified ([place]) --Per Dr. Sheppard [name], Psychiatry  4. Anxiety state --Per Dr. Sheppard [name], Psychiatry  5. S/P laparoscopic sleeve gastrectomy 01/2014  6. Other iron deficiency anemias  7. Smoker  8. Need for prophylactic vaccination and inoculation against influenza - Flu Vaccine QUAD 36+ mos IM   45 minutes spent in direct face to face communication with patient.     Signed, [address] [place], [place], BSFM 12/13/2014 10:50 AM

## 2014-12-18 DIAGNOSIS — M65872 Other synovitis and tenosynovitis, left ankle and foot: Secondary | ICD-10-CM | POA: Diagnosis not present

## 2014-12-18 DIAGNOSIS — M722 Plantar fascial fibromatosis: Secondary | ICD-10-CM | POA: Diagnosis not present

## 2014-12-18 DIAGNOSIS — M792 Neuralgia and neuritis, unspecified: Secondary | ICD-10-CM | POA: Diagnosis not present

## 2014-12-18 DIAGNOSIS — M65871 Other synovitis and tenosynovitis, right ankle and foot: Secondary | ICD-10-CM | POA: Diagnosis not present

## 2014-12-18 DIAGNOSIS — M79609 Pain in unspecified limb: Secondary | ICD-10-CM | POA: Diagnosis not present

## 2014-12-19 DIAGNOSIS — F312 Bipolar disorder, current episode manic severe with psychotic features: Secondary | ICD-10-CM | POA: Diagnosis not present

## 2014-12-25 DIAGNOSIS — M76822 Posterior tibial tendinitis, left leg: Secondary | ICD-10-CM | POA: Diagnosis not present

## 2014-12-25 DIAGNOSIS — M792 Neuralgia and neuritis, unspecified: Secondary | ICD-10-CM | POA: Diagnosis not present

## 2014-12-25 DIAGNOSIS — M722 Plantar fascial fibromatosis: Secondary | ICD-10-CM | POA: Diagnosis not present

## 2014-12-27 ENCOUNTER — Encounter: Payer: Self-pay | Admitting: Physician Assistant

## 2014-12-27 ENCOUNTER — Ambulatory Visit (INDEPENDENT_AMBULATORY_CARE_PROVIDER_SITE_OTHER): Payer: Medicare Other | Admitting: Physician Assistant

## 2014-12-27 VITALS — BP 128/90 | HR 84 | Temp 97.5°F | Resp 18

## 2014-12-27 DIAGNOSIS — F3160 Bipolar disorder, current episode mixed, unspecified: Secondary | ICD-10-CM

## 2014-12-27 DIAGNOSIS — T888XXS Other specified complications of surgical and medical care, not elsewhere classified, sequela: Secondary | ICD-10-CM | POA: Diagnosis not present

## 2014-12-27 DIAGNOSIS — F411 Generalized anxiety disorder: Secondary | ICD-10-CM | POA: Diagnosis not present

## 2014-12-27 DIAGNOSIS — M5137 Other intervertebral disc degeneration, lumbosacral region: Secondary | ICD-10-CM | POA: Diagnosis not present

## 2014-12-27 DIAGNOSIS — F17200 Nicotine dependence, unspecified, uncomplicated: Secondary | ICD-10-CM

## 2014-12-27 DIAGNOSIS — S301XXS Contusion of abdominal wall, sequela: Secondary | ICD-10-CM

## 2014-12-27 DIAGNOSIS — Z720 Tobacco use: Secondary | ICD-10-CM

## 2014-12-27 DIAGNOSIS — T792XXS Traumatic secondary and recurrent hemorrhage and seroma, sequela: Secondary | ICD-10-CM

## 2014-12-27 DIAGNOSIS — D508 Other iron deficiency anemias: Secondary | ICD-10-CM

## 2014-12-27 DIAGNOSIS — Z9884 Bariatric surgery status: Secondary | ICD-10-CM | POA: Diagnosis not present

## 2014-12-27 NOTE — Progress Notes (Addendum)
Patient ID: [name] MRN: [id number], DOB: [birth year], 39 y.o. Date of Encounter: @DATE @  Chief Complaint:  Chief Complaint  Patient presents with  . 2 week follow up    HPI: 39 y.o. year old white female  presents as a new patient to establish care here.  Her mother, [name], has been a patient of mine for years. Unfortunately, [name]'s husband passed away in the past couple of years secondary to leukemia.  Patient lives with her mother. Her mother helps care for her as best she can. Also in the house, is patient's son who I just saw as a new patient to establish care here. He is 38 years old.(He is mixed black/white--his name is [name])  Today patient also tells me that she has a daughter who is now 32 years old named [name]. She says that back then after giving birth to [place], pt was involved in drugs, etc---therefore, patient's parents raised [name].  These are the only children she has had.  Patient makes reference to prior history of repeated drug abuse. She also discusses that she has "severe case of Bipolar Disorder--- and that it is so bad that if she does not take her medicines exactly as directed and to see her psychiatrist as frequently as they state that she will have to be in an institution."  Says that she had a recent "breakdown and was "inpatient ". Says this was beginning of September. Says she stayed inpatient for 7 days. Says because of this recent "breakdown and inpatient status "she currently is seeing her psychiatrist frequently,-- once a week. Also makes reference to having "mini bipolar episodes " currently/recently.   She also discusses the fact that her psychiatrist has documentation so that he is allowed to talk to all of her doctors and medical providers. She says that for all medications prescribed, we have to ask him if it is okay for her to take those medications in combination with her psych medications.  When discussing her past  drug abuse, she talks about that "drugs really messed up her brain and that the combination of that and her severe bipolar disorder make her very difficult to control and almost has to be in an institution and that she knows that if she does not carefully follow with the psychiatrist says is necessary, that this is what will happen."  Her psychiatrist is [name]. She says that he works in the hospital with the inpatients all days except for 1 day a week he is in the office and that she has been a patient of his for many years.  At the end of today's visit, she also makes reference to fact that "I know I am on my last chance with him and if I mess things up again, he won't keep helping me and being my doctor".  She has had multiple abdominal surgeries.(We did not get into any specific details regarding this today and will further discuss this at follow-up visits.)  She is seeing [name] at [facility] regarding her most recent abdominal surgery.  She says that she is also seeing [name] at [facility]. Says she sees him regarding her herniated disc in her lumbar spine.  She says that her Psychiatrist, [name], "has been helping her so she will not have to go to the emergency room." Says that [name] prescribed her some tramadol and that the bottle she has with her today was the second bottle  of tramadol that he has prescribed for her. She says that [name] is out of town. Says that he left yesterday. Says that she knows he will be back in town in time for her next appointment with him, which she says is Wednesday, October 26. She says that she took the last tablet of the tramadol in this bottle this morning. She has that empty bottle with her and it was prescribed on 12/05/14 for #12 (twelve).  She says that "you have to be very careful about what kind of medicines you mix with the psych medicines [she] is on". And "have to be careful with what medicines you mix  with people with bipolar". Says that in the past, after her surgery, she was given oxycodone. Says that "people who are bipolar and who take Percocet- it makes them "see things and hear things "."  Says that's why she had to be hospitalized-- was for detoxification off of this.   ADDENDUM ADDED 12/14/2014: Received OV Note from [name] at [facility].  " S/P Bariatric Surgery. ---underwent laparoscopic sleeve gastrectomy with lysis of adhesions for 1 hour on Dec 22,2015." "Her course has been complicated.  After her sleeve gastrectomy she developed a recurrent fluid--a chronic abdominal wall seroma from her prior open ventral hernia repair done at an outside hospital.  She has had a Hartman procedure for perforated diverticulitis follwoed by colostomy takedown followed by open repair of incisional hernia which was complicated by a seroma which had to be drained--all of this done at an outside [place] 6 years ago. The fluid in her seroma has been persistnat.  We have percutaneously drained twice.  We had to remove a drain early secondary to pt discomfort." "I eneded up taking her back to the OR 09/27/14. Her sleeve appeared normal. There were some recurrnet adhesions in her midline. I also did I&D and laparoscopic exploration of hte abdominal wall seroma. Placed talc powder into seroma cavity to try to get it to scar down and stop producing fluid.  She has had ongoing issues since then.  She has called the office numerous times.  She has gone to the ER. The small incision mad to incise and drain the seroma apparently was opened up.   His note does include that she was hospitalized in [facility] at [facility] as a Inpatient.  Dr. Dois [name] note states that he spoke with her Psychiatrist.  At this [place] 12/13/14, he noted that she had not called their office asking for pain meds.  At that [place], she reported making some bad food choices. Has been eating sweets. Had bowl of  icecream. Not exercising.  At that [place] he rediscussed proper eating techniques and importantce of supplemenst and proper food choices.   He asked her to make commitment of going to [facility] 2 times a week b/t now and next OV.   He noted she had lost 90 lbs since surgery. But, she had gained 10 lbs since his last/prior OV with her.   His note also mentions that at that [place] 12/13/14 her behavior was typical for her and better than it had been at the last 2 appointments."   OV 12/27/2014: Today she has the following updates: --- States that yesterday she went and got her second injection in her foot/ankle. She brought the business card for the [facility] [place].-- Dr. Barkley Bruns. She is wearing a boot on her left foot. Says that she is supposed to go back  there and recheck in one week. At that time either they may be able to remove the boot or they may have to have her continue the boot for 3 months. Says that they think she has a torn tendon says that usually they treat this with surgery but they have told her they are not doing any more surgery on her and that if it does not heal in one week and it does indicate that the tendon is torn, then she will need to wear the boot for 3 months and they will treat it with this rather than surgery.   States that [name] has referred her to pain management--- [facility]--- she has the new patient packet with her today--- says that her first appointment there is November 15. Says that so far her psychiatrist [name] has been prescribing her tramadol and Flexeril. Says that she knows that he is getting older and will have to retire at some point and that other psychiatrists are not going to prescribe these medicines for her. Says that's why she is getting established with the pain clinic so they can prescribe these medications. Says that these medications are working well for her and that she is NOT going to the pain clinic to get narcotic medications.   Says that she absolutely does not want to ever use oxycodone to hydrocodone's again.  She also makes mention of past B12 deficiency and getting B12 injections around the time of her surgeries and also makes mention of having to get IV iron therapy. Makes reference to Dr. Beryle Beams. I reviewed in Epic that she last was evaluated with Dr. Beryle Beams 11/12/14-- at that time her iron and B12 levels were normal. Discussed with her that most likely in the past when she was having those surgeries and complications, her body was not absorbing these but now is able to absorb these. Therefore does not need supplements.  Also mentions that she has been having a lot of problems with her 2 top back teeth. She spent tons of money on crowns and root canals and now still may lose those teeth.  ADDENDUM ADDED 01/02/2015: Received records from [facility].  Their diagnosis: Neuritis,  Plantar Fasciitis, Tenosynovitis Left, Tenosynovitis Right. Their evaluation has included multiple x-rays of the feet and ankles as well as ultrasound evaluations.  Past Medical History  Diagnosis Date  . GERD (gastroesophageal reflux disease)   . Diverticulitis 2008    perforated/ requiring resection  . Colostomy in place [facility])   . Bipolar affective ([place])   . Depression   . Anxiety   . Asthma   . Arthritis   . Blood transfusion without reported diagnosis   . Substance abuse     12 years ago-crack cocaine  . Family history of adverse reaction to anesthesia     MGM was placed on vent after surgery  . Sleep apnea     mild, does not use c-pap machine  . Shortness of breath dyspnea   . Headache   . Anemia      Home Meds: Outpatient Prescriptions Prior to Visit  Medication Sig Dispense Refill  . Calcium Carbonate-Vitamin D (CALCIUM + D PO) Take 1 tablet by mouth daily.    . clonazePAM (KLONOPIN) 1 MG tablet Take 1 mg by mouth 2 (two) times daily.     . cyclobenzaprine (FLEXERIL) 5 MG tablet Take 1 tablet (5  mg total) by mouth 2 (two) times daily as needed for muscle  spasms. 10 tablet 0  . ferrous sulfate 325 (65 FE) MG tablet Take 650 mg by mouth 2 (two) times daily.     . haloperidol (HALDOL) 5 MG tablet 1 tab in AM and 3 tab at HS    . ibuprofen (ADVIL,MOTRIN) 200 MG tablet Take 800 mg by mouth every 6 (six) hours as needed for moderate pain.    . Multiple Vitamins-Minerals (HAIR/SKIN/NAILS/BIOTIN) TABS Take 1 tablet by mouth 2 (two) times daily.    Marland Kitchen NUVIGIL 250 MG tablet Take 250 mg by mouth daily.  5  . ondansetron (ZOFRAN ODT) 4 MG disintegrating tablet Take 1 tablet (4 mg total) by mouth every 8 (eight) hours as needed for nausea or vomiting. 40 tablet 0  . QUEtiapine (SEROQUEL) 300 MG tablet 2 hs    . traMADol (ULTRAM) 50 MG tablet Take 1 tablet (50 mg total) by mouth every 8 (eight) hours as needed. 12 tablet 0  . valACYclovir (VALTREX) 500 MG tablet Take 500 mg by mouth daily as needed (fever blister).     Marland Kitchen zolpidem (AMBIEN) 10 MG tablet Take 10 mg by mouth at bedtime.     . traMADol (ULTRAM) 50 MG tablet Take 1 tablet (50 mg total) by mouth every 6 (six) hours as needed. 10 tablet 0  . oxyCODONE (ROXICODONE) 5 MG immediate release tablet Take 1 tablet (5 mg total) by mouth every 4 (four) hours as needed for severe pain. (Patient not taking: Reported on 12/13/2014) 15 tablet 0  . PERCOCET 5-325 MG per tablet Take 1 tablet by mouth every 6 (six) hours as needed for severe pain. (Patient not taking: Reported on 12/13/2014) 12 tablet 0  . predniSONE (DELTASONE) 10 MG tablet 5,4,3,2,1 - take with food (Patient not taking: Reported on 12/27/2014) 15 tablet 0   No facility-administered medications prior to visit.    Allergies:  Allergies  Allergen Reactions  . Nsaids   . Tylenol [Acetaminophen] Nausea And Vomiting    Social History   Social History  . Marital Status: Single    Spouse Name: N/A  . Number of Children: 2  . Years of Education: N/A   Occupational History  . Dental  Assistant    Social History Main Topics  . Smoking status: Former Smoker -- 0.50 packs/day for 14 years    Types: E-cigarettes    Quit date: 12/20/2013  . Smokeless tobacco: Never Used     Comment: Vapor cigarettes.  . Alcohol Use: No  . Drug Use: No  . Sexual Activity: No   Other Topics Concern  . Not on file   Social History Narrative    Family History  Problem Relation Age of Onset  . Asthma Maternal Grandmother      Review of Systems:  See HPI for pertinent ROS. All other ROS negative.    Physical Exam: Blood pressure 128/90, pulse 84, temperature 97.5 F (36.4 C), temperature source Oral, resp. rate 18., There is no weight on file to calculate BMI. General: Overweight WF. Appears in no acute distress. Neck: Supple. No thyromegaly. No lymphadenopathy. Lungs: Clear bilaterally to auscultation without wheezes, rales, or rhonchi. Breathing is unlabored. Heart: RRR with S1 S2. No murmurs, rubs, or gallops. Musculoskeletal:  Strength and tone normal for age. Extremities/Skin: Warm and dry.  Neuro: Alert and oriented X 3. Moves all extremities spontaneously. Gait is normal. CNII-XII grossly in tact. Psych:  Responds to questions appropriately with a normal affect.     ASSESSMENT AND PLAN:  39 y.o. year old female with  1. Degeneration of lumbar or lumbosacral intervertebral disc --This is managed at [facility]. This is stable/controlled.  2. Abdominal wall seroma, sequela --Per [name] at [facility]. This is stable/controlled.  3. Bipolar affective disorder, current episode mixed, current episode severity unspecified ([place]) --Per [name], Psychiatry  4. Anxiety state --Per [name], Psychiatry  5. S/P laparoscopic sleeve gastrectomy 01/2014 She has follow-up with [name] at [facility].  6. Other iron deficiency anemias This has been managed by Dr. Beryle Beams and is currently stable.  7. Smoker Given her  psychiatric comorbidities, we will not be able to have smoking cessation anytime in the near future.  Will plan for her to have routine follow-up visit in 3 months. Follow-up sooner if needed.   Signed, [address] [place], [place], [facility] 12/27/2014 10:12 AM

## 2015-01-01 DIAGNOSIS — M76822 Posterior tibial tendinitis, left leg: Secondary | ICD-10-CM | POA: Diagnosis not present

## 2015-01-01 DIAGNOSIS — M722 Plantar fascial fibromatosis: Secondary | ICD-10-CM | POA: Diagnosis not present

## 2015-01-03 ENCOUNTER — Ambulatory Visit: Payer: Medicare Other | Admitting: Dietician

## 2015-01-08 DIAGNOSIS — M170 Bilateral primary osteoarthritis of knee: Secondary | ICD-10-CM | POA: Diagnosis not present

## 2015-01-08 DIAGNOSIS — Z79899 Other long term (current) drug therapy: Secondary | ICD-10-CM | POA: Diagnosis not present

## 2015-01-08 DIAGNOSIS — M4727 Other spondylosis with radiculopathy, lumbosacral region: Secondary | ICD-10-CM | POA: Diagnosis not present

## 2015-01-08 DIAGNOSIS — Z79891 Long term (current) use of opiate analgesic: Secondary | ICD-10-CM | POA: Diagnosis not present

## 2015-01-08 DIAGNOSIS — G894 Chronic pain syndrome: Secondary | ICD-10-CM | POA: Diagnosis not present

## 2015-01-09 ENCOUNTER — Encounter: Payer: Medicare Other | Attending: Internal Medicine | Admitting: Dietician

## 2015-01-09 ENCOUNTER — Encounter: Payer: Self-pay | Admitting: Dietician

## 2015-01-09 DIAGNOSIS — Z9884 Bariatric surgery status: Secondary | ICD-10-CM | POA: Insufficient documentation

## 2015-01-09 DIAGNOSIS — E669 Obesity, unspecified: Secondary | ICD-10-CM | POA: Diagnosis not present

## 2015-01-09 DIAGNOSIS — Z713 Dietary counseling and surveillance: Secondary | ICD-10-CM | POA: Insufficient documentation

## 2015-01-09 DIAGNOSIS — I10 Essential (primary) hypertension: Secondary | ICD-10-CM | POA: Insufficient documentation

## 2015-01-09 DIAGNOSIS — Z48815 Encounter for surgical aftercare following surgery on the digestive system: Secondary | ICD-10-CM | POA: Insufficient documentation

## 2015-01-09 DIAGNOSIS — D509 Iron deficiency anemia, unspecified: Secondary | ICD-10-CM | POA: Diagnosis not present

## 2015-01-09 DIAGNOSIS — K219 Gastro-esophageal reflux disease without esophagitis: Secondary | ICD-10-CM | POA: Insufficient documentation

## 2015-01-09 DIAGNOSIS — Z6832 Body mass index (BMI) 32.0-32.9, adult: Secondary | ICD-10-CM | POA: Diagnosis not present

## 2015-01-09 NOTE — Progress Notes (Signed)
  Follow-up visit:  11 months Post-Operative Gastric Sleeve Surgery  Medical Nutrition Therapy:  Appt start time: 910 end time: 945  Primary concerns today: Post-operative Bariatric Surgery Nutrition Management. [name] returns today having gained 11 pounds. She states that she is ready to talk about nutrition today. Reports that she is "poor" from a nutrition standpoint but more stable emotionally. Craving candy. We agreed to focus on healthy habits rather than weight today. Eating about 5x a day. States that tastes have changed and if she eats a food, she may not like to eat it again for weeks or months.   Samples provided and patient instructed on proper use: Unjury protein powder (chocolate - qty 4) Lot#: [id number] Exp: 08/2015  Surgery date: 02/13/2014 Surgery type: Sleeve Start weight at [facility]: 314 lbs on 06/30/2013 Weight today: 221.3 lbs Weight change: 11 lbs gain Total weight lost: 92.7 lbs  TANITA  BODY COMP RESULTS  01/29/14 02/27/14 04/11/14 05/11/14 09/06/14 11/22/14   BMI (kg/m^2) 48.7 44.9 41.1 39 32.6 N/A   Fat Mass (lbs) 171.5 157.0 138.5 122.5 87.5    Fat Free Mass (lbs) 148.5 138.0 132 134 127    Total Body Water (lbs) 108.5 101.0 96.5 98 93     Preferred Learning Style:   No preference indicated   Learning Readiness:   Ready  24-hr recall: B: bacon, egg, and cheese biscuit L: cheese crackers D: 3 oz chicken and sweet potato from [employer]  Fluid intake: 64 ounces (Coke, [employer], diet Sundrop, juice, chocolate milk, water "at night with medicines") Estimated total protein intake: difficult to assess  Medications: see list Supplementation: taking "sometimes"  Drinking while eating: no Hair loss: yes, severe Carbonated beverages: diet sodas  Recent physical activity:  none  Progress Towards Goal(s):  Some progress.   Nutritional Diagnosis:  [place]-3.3 Overweight/obesity related to past poor dietary habits and physical inactivity as evidenced by patient w/  recent gastric sleeve surgery following dietary guidelines for continued weight loss.  Intervention:  Nutrition counseling provided. Goals: -Try making your own chocolate milk with sugar free chocolate syrup and chocolate protein powder -Increase protein and reduce carbs  -Cheese and deli meat, eggs, [place] sausage, chili, chicken salad, Jimmy Dean frittatas -Eat meats first   -Practice chewing thoroughly, eating slowly, and taking tiny bites -Try to eat a protein food every time you eat -Work on cutting back on sodas, juice, [employer], and other sugary drinks  -Drink more water and sugar free flavoring -Avoid buying candy!  Grocery list: -Sugar free cherry Art gallery manager -Deli meat -Cheese (American singles and Laughing cow) -Consulting civil engineer frozen frittatas -Consulting civil engineer fully cooked [place] sausage -Sugar free chocolate syrup -Chicken salad -Eggs   Teaching Method Utilized:  Visual Auditory Hands on  Barriers to learning/adherence to lifestyle change: unable to meet protein needs  Demonstrated degree of understanding via:  Teach Back   Monitoring/Evaluation:  Dietary intake, exercise, and body weight. Follow up in 4 weeks for 12 month post-op visit.

## 2015-01-09 NOTE — Patient Instructions (Addendum)
-  Try making your own chocolate milk with sugar free chocolate syrup and chocolate protein powder  -Increase protein and reduce carbs  -Cheese and deli meat, eggs, [place] sausage, chili, chicken salad, Jimmy Dean frittatas  -Eat meats first    -Practice chewing thoroughly, eating slowly, and taking tiny bites  -Try to eat a protein food every time you eat  -Work on cutting back on sodas, juice, Boeing, and other sugary drinks  -Drink more water and sugar free flavoring  -Avoid buying candy!  Grocery list: -Sugar free cherry Kool Aid water flavoring -Deli meat -Cheese (American singles and Laughing cow) -Consulting civil engineer frozen frittatas -Danton Clap fully cooked [place] sausage -Sugar free chocolate syrup -Chicken salad -Eggs

## 2015-01-16 DIAGNOSIS — M722 Plantar fascial fibromatosis: Secondary | ICD-10-CM | POA: Diagnosis not present

## 2015-01-16 DIAGNOSIS — M65872 Other synovitis and tenosynovitis, left ankle and foot: Secondary | ICD-10-CM | POA: Diagnosis not present

## 2015-01-21 DIAGNOSIS — M5417 Radiculopathy, lumbosacral region: Secondary | ICD-10-CM | POA: Diagnosis not present

## 2015-01-21 DIAGNOSIS — G894 Chronic pain syndrome: Secondary | ICD-10-CM | POA: Diagnosis not present

## 2015-01-21 DIAGNOSIS — M170 Bilateral primary osteoarthritis of knee: Secondary | ICD-10-CM | POA: Diagnosis not present

## 2015-01-21 DIAGNOSIS — M47817 Spondylosis without myelopathy or radiculopathy, lumbosacral region: Secondary | ICD-10-CM | POA: Diagnosis not present

## 2015-02-06 ENCOUNTER — Encounter: Payer: Medicare Other | Attending: Internal Medicine | Admitting: Dietician

## 2015-02-06 ENCOUNTER — Encounter: Payer: Self-pay | Admitting: Dietician

## 2015-02-06 DIAGNOSIS — E669 Obesity, unspecified: Secondary | ICD-10-CM | POA: Insufficient documentation

## 2015-02-06 DIAGNOSIS — D509 Iron deficiency anemia, unspecified: Secondary | ICD-10-CM | POA: Insufficient documentation

## 2015-02-06 DIAGNOSIS — Z9884 Bariatric surgery status: Secondary | ICD-10-CM | POA: Insufficient documentation

## 2015-02-06 DIAGNOSIS — Z713 Dietary counseling and surveillance: Secondary | ICD-10-CM | POA: Diagnosis not present

## 2015-02-06 DIAGNOSIS — K219 Gastro-esophageal reflux disease without esophagitis: Secondary | ICD-10-CM | POA: Insufficient documentation

## 2015-02-06 DIAGNOSIS — Z6832 Body mass index (BMI) 32.0-32.9, adult: Secondary | ICD-10-CM | POA: Insufficient documentation

## 2015-02-06 DIAGNOSIS — I10 Essential (primary) hypertension: Secondary | ICD-10-CM | POA: Diagnosis not present

## 2015-02-06 DIAGNOSIS — Z48815 Encounter for surgical aftercare following surgery on the digestive system: Secondary | ICD-10-CM | POA: Insufficient documentation

## 2015-02-06 NOTE — Patient Instructions (Signed)
-  Try making your own chocolate milk with sugar free chocolate syrup and chocolate protein powder  -Increase protein and reduce carbs  -Cheese and deli meat, eggs, [place] sausage, chili, chicken salad, Jimmy Dean frittatas  -Eat meats first    -Practice chewing thoroughly, eating slowly, and taking tiny bites  -Try to eat a protein food every time you eat  -Work on cutting back on sodas, juice, Boeing, and other sugary drinks  -Drink more water and sugar free flavoring  -Avoid buying candy!  Grocery list: -Sugar free cherry Kool Aid water flavoring -Deli meat -Cheese (American singles and Laughing cow) -Consulting civil engineer frozen frittatas -Danton Clap fully cooked [place] sausage -Sugar free chocolate syrup -Chicken salad -Eggs

## 2015-02-06 NOTE — Progress Notes (Signed)
  Follow-up visit:  12 months Post-Operative Gastric Sleeve Surgery  Medical Nutrition Therapy:  Appt start time: 1030 end time: 1100  Primary concerns today: Post-operative Bariatric Surgery Nutrition Management. [name] returns today having lost 1 pound. She is still eating foods that are not recommended. Has pizza about 1x a week. Has been eating too many sweets. Eating a lot of applesauce and yogurt covered raisins. Will have nabs for lunch and Special K cereal in the night. Has been eating out a lot but splits plates with her mom. Tried Unjury protein powder in milk and did not like it. She has been taking her vitamins more regularly. No longer drinking regular sodas but drinking diet soda.   Surgery date: 02/13/2014 Surgery type: Sleeve Start weight at [facility]: 314 lbs on 06/30/2013 Weight today: 220 lbs Weight change: 1 lbs loss Total weight lost: 91.7 lbs  TANITA  BODY COMP RESULTS  01/29/14 02/27/14 04/11/14 05/11/14 09/06/14 11/22/14   BMI (kg/m^2) 48.7 44.9 41.1 39 32.6 N/A   Fat Mass (lbs) 171.5 157.0 138.5 122.5 87.5    Fat Free Mass (lbs) 148.5 138.0 132 134 127    Total Body Water (lbs) 108.5 101.0 96.5 98 93     Preferred Learning Style:   No preference indicated   Learning Readiness:   Ready  24-hr recall: B: bacon, egg, and cheese biscuit L: cheese crackers D: 3 oz chicken and sweet potato from [employer]  Fluid intake: 64 ounces (Coke, [employer], diet Sundrop, juice, chocolate milk, water "at night with medicines") Estimated total protein intake: difficult to assess  Medications: see list Supplementation: taking "sometimes"  Drinking while eating: no Hair loss: yes, severe Carbonated beverages: diet sodas  Recent physical activity:  none  Progress Towards Goal(s):  Some progress.   Nutritional Diagnosis:  [place]-3.3 Overweight/obesity related to past poor dietary habits and physical inactivity as evidenced by patient w/ recent gastric sleeve surgery following  dietary guidelines for continued weight loss.  Intervention:  Nutrition counseling provided. Goals: -Try making your own chocolate milk with sugar free chocolate syrup and chocolate protein powder -Increase protein and reduce carbs  -Cheese and deli meat, eggs, [place] sausage, chili, chicken salad, Jimmy Dean frittatas -Eat meats first   -Practice chewing thoroughly, eating slowly, and taking tiny bites -Try to eat a protein food every time you eat -Work on cutting back on sodas, juice, Boeing, and other sugary drinks  -Drink more water and sugar free flavoring -Avoid buying candy!  Grocery list: -Sugar free cherry Art gallery manager -Deli meat -Cheese (American singles and Laughing cow) -Consulting civil engineer frozen frittatas -Consulting civil engineer fully cooked [place] sausage -Sugar free chocolate syrup -Chicken salad -Eggs   Teaching Method Utilized:  Visual Auditory Hands on  Barriers to learning/adherence to lifestyle change: unable to meet protein needs  Demonstrated degree of understanding via:  Teach Back   Monitoring/Evaluation:  Dietary intake, exercise, and body weight. Follow up in 4 weeks for 13 month post-op visit.

## 2015-02-11 ENCOUNTER — Ambulatory Visit (INDEPENDENT_AMBULATORY_CARE_PROVIDER_SITE_OTHER): Payer: Medicare Other | Admitting: Physician Assistant

## 2015-02-11 ENCOUNTER — Encounter: Payer: Self-pay | Admitting: Physician Assistant

## 2015-02-11 VITALS — BP 158/100 | HR 100 | Temp 97.7°F | Resp 18 | Wt 219.0 lb

## 2015-02-11 DIAGNOSIS — L03311 Cellulitis of abdominal wall: Secondary | ICD-10-CM

## 2015-02-11 MED ORDER — SULFAMETHOXAZOLE-TRIMETHOPRIM 800-160 MG PO TABS: 1.0000 | ORAL_TABLET | Freq: Two times a day (BID) | ORAL | Status: DC

## 2015-02-11 NOTE — Progress Notes (Signed)
Patient ID: DIXI DIFELICE MRN: [id number], DOB: Sep 19, 1975, 39 y.o. Date of Encounter: 02/11/2015, 4:18 PM    Chief Complaint:  Chief Complaint  Patient presents with  . wound on abd incision     HPI: 39 y.o. year old white female presents with above.  She has had multiple abdominal surgeries over the years. She states that this particular scar is from a surgery that was done 10 years ago. She says that she has not had any problems with this scar until now. Says that she just started noticing it getting red and irritated 5 days ago. Says that she does not know why it is having issues at this point. Is not aware of any trauma or injury to the site and says that she "has not been picking at it."     Home Meds:   Outpatient Prescriptions Prior to Visit  Medication Sig Dispense Refill  . benztropine (COGENTIN) 2 MG tablet Take 2 mg by mouth.    . buprenorphine (BUTRANS) 10 MCG/HR PTWK patch Place 10 mcg onto the skin once a week.    . Calcium Carbonate-Vitamin D (CALCIUM + D PO) Take 1 tablet by mouth daily.    . clonazePAM (KLONOPIN) 1 MG tablet Take 1 mg by mouth 2 (two) times daily.     . ferrous sulfate 325 (65 FE) MG tablet Take 650 mg by mouth 2 (two) times daily.     . haloperidol (HALDOL) 5 MG tablet 1 tab in AM and 3 tab at HS    . ibuprofen (ADVIL,MOTRIN) 200 MG tablet Take 800 mg by mouth every 6 (six) hours as needed for moderate pain.    . Multiple Vitamins-Minerals (HAIR/SKIN/NAILS/BIOTIN) TABS Take 1 tablet by mouth 2 (two) times daily.    Marland Kitchen NUVIGIL 250 MG tablet Take 250 mg by mouth daily.  5  . ondansetron (ZOFRAN ODT) 4 MG disintegrating tablet Take 1 tablet (4 mg total) by mouth every 8 (eight) hours as needed for nausea or vomiting. 40 tablet 0  . QUEtiapine (SEROQUEL) 300 MG tablet 2 hs    . valACYclovir (VALTREX) 500 MG tablet Take 500 mg by mouth daily as needed (fever blister).     Marland Kitchen zolpidem (AMBIEN) 10 MG tablet Take 10 mg by mouth at bedtime.     .  cyclobenzaprine (FLEXERIL) 5 MG tablet Take 1 tablet (5 mg total) by mouth 2 (two) times daily as needed for muscle spasms. (Patient not taking: Reported on 02/11/2015) 10 tablet 0  . oxyCODONE (ROXICODONE) 5 MG immediate release tablet Take 1 tablet (5 mg total) by mouth every 4 (four) hours as needed for severe pain. (Patient not taking: Reported on 12/13/2014) 15 tablet 0  . PERCOCET 5-325 MG per tablet Take 1 tablet by mouth every 6 (six) hours as needed for severe pain. (Patient not taking: Reported on 12/13/2014) 12 tablet 0  . predniSONE (DELTASONE) 10 MG tablet 5,4,3,2,1 - take with food (Patient not taking: Reported on 12/27/2014) 15 tablet 0  . traMADol (ULTRAM) 50 MG tablet Take 1 tablet (50 mg total) by mouth every 8 (eight) hours as needed. 12 tablet 0   No facility-administered medications prior to visit.    Allergies:  Allergies  Allergen Reactions  . Nsaids   . Tylenol [Acetaminophen] Nausea And Vomiting      Review of Systems: See HPI for pertinent ROS. All other ROS negative.    Physical Exam: Blood pressure 158/100, pulse 100, temperature 97.7 F (  36.5 C), temperature source Oral, resp. rate 18, weight 219 lb (99.338 kg)., Body mass index is 37.02 kg/(m^2). General:  WF. Appears in no acute distress. Neck: Supple. No thyromegaly. No lymphadenopathy. Lungs: Clear bilaterally to auscultation without wheezes, rales, or rhonchi. Breathing is unlabored. Heart: Regular rhythm. No murmurs, rubs, or gallops. Msk:  Strength and tone normal for age Skin: On the Abdomen: Approx 1-2 cm to the left of the umbilicus, there is a long verticle scar.  It is on part of this scar, that she has area of erythema and drainage. The area of erythema measures 1 cm horizontally and 2 cm vertically.  Within this area of erythema, there are 2 small scabbed areas, one of which has small amount of drainage present.  There is no sign of abscess. Note: there is another scar from a laparoscopic  surgery near the scar that is discussed above. The scar from the laparoscopic surgery appears normal with no erythema, no drainage.  Neuro: Alert and oriented X 3. Moves all extremities spontaneously. Gait is normal. CNII-XII grossly in tact. Psych:  Responds to questions appropriately with a normal affect.     ASSESSMENT AND PLAN:  39 y.o. year old female with  1. Cellulitis of abdominal wall There is an area with small amount of drainage present. Culture was obtained from this site. There was very minimal amount of drainage present. She is to go ahead and start Bactrim and I will follow-up results of culture. I told her to follow-up immediately if site worsens. Also, told to follow-up if site does not return to "normal" once antibiotics completed. - Culture, routine-abscess - sulfamethoxazole-trimethoprim (BACTRIM DS,SEPTRA DS) 800-160 MG tablet; Take 1 tablet by mouth 2 (two) times daily.  Dispense: 20 tablet; Refill: 0   Signed, [address] [place], [place], [facility] 02/11/2015 4:18 PM

## 2015-02-14 LAB — CULTURE, ROUTINE-ABSCESS: Gram Stain: NONE SEEN

## 2015-02-20 DIAGNOSIS — M47817 Spondylosis without myelopathy or radiculopathy, lumbosacral region: Secondary | ICD-10-CM | POA: Diagnosis not present

## 2015-02-20 DIAGNOSIS — M170 Bilateral primary osteoarthritis of knee: Secondary | ICD-10-CM | POA: Diagnosis not present

## 2015-02-20 DIAGNOSIS — M5417 Radiculopathy, lumbosacral region: Secondary | ICD-10-CM | POA: Diagnosis not present

## 2015-02-20 DIAGNOSIS — G894 Chronic pain syndrome: Secondary | ICD-10-CM | POA: Diagnosis not present

## 2015-02-27 ENCOUNTER — Encounter: Payer: Self-pay | Admitting: Physician Assistant

## 2015-02-27 ENCOUNTER — Ambulatory Visit (INDEPENDENT_AMBULATORY_CARE_PROVIDER_SITE_OTHER): Payer: Medicare Other | Admitting: Physician Assistant

## 2015-02-27 VITALS — BP 134/80 | HR 88 | Temp 98.0°F | Resp 20 | Wt 227.0 lb

## 2015-02-27 DIAGNOSIS — L03311 Cellulitis of abdominal wall: Secondary | ICD-10-CM | POA: Diagnosis not present

## 2015-02-27 DIAGNOSIS — T792XXS Traumatic secondary and recurrent hemorrhage and seroma, sequela: Secondary | ICD-10-CM | POA: Diagnosis not present

## 2015-02-27 DIAGNOSIS — T888XXS Other specified complications of surgical and medical care, not elsewhere classified, sequela: Secondary | ICD-10-CM | POA: Diagnosis not present

## 2015-02-27 DIAGNOSIS — S301XXS Contusion of abdominal wall, sequela: Secondary | ICD-10-CM

## 2015-02-27 DIAGNOSIS — Z9884 Bariatric surgery status: Secondary | ICD-10-CM

## 2015-02-27 MED ORDER — SULFAMETHOXAZOLE-TRIMETHOPRIM 800-160 MG PO TABS: 1.0000 | ORAL_TABLET | Freq: Two times a day (BID) | ORAL | Status: DC

## 2015-02-27 NOTE — Progress Notes (Addendum)
Patient ID: [name] MRN: [id number], DOB: 1976/02/20, 40 y.o. Date of Encounter: 02/27/2015, 12:48 PM    Chief Complaint:  Chief Complaint  Patient presents with  . check abd wound    took all abx     HPI: 40 y.o. year old white female presents with above.  THE FOLLOWING IS COPIED FROM HER OV NOTE WITH ME 02/11/2015: She has had multiple abdominal surgeries over the years. She states that this particular scar is from a surgery that was done 10 years ago. She says that she has not had any problems with this scar until now. Says that she just started noticing it getting red and irritated 5 days ago. Says that she does not know why it is having issues at this point. Is not aware of any trauma or injury to the site and says that she "has not been picking at it."  At that visit ,culture was obtained/since. At that visit,  Bactrim was prescribed.  Subsequently, culture showed staph aureus which was sensitive to Bactrim.  Today patient states that she took the Bactrim as directed and completed it.  However, site has not resolved and still looks similar to it what it looked like at last visit, so patient came in for follow-up evaluation.  Today I told her that I am concerned that what we are seeing is the surface of an underlying infection and concern that she could possibly have infection in her abdomen. She states that in the past she did have that problem---- that back then she would press on area and infection would come out. Says she has had problem with seroma.  When she recently established care with me, I requested recent records from [name], her surgeon at [facility].  I recall those records indicating seroma.   Home Meds:   Outpatient Prescriptions Prior to Visit  Medication Sig Dispense Refill  . benztropine (COGENTIN) 2 MG tablet Take 2 mg by mouth.    . Calcium Carbonate-Vitamin D (CALCIUM + D PO) Take 1 tablet by mouth daily.    . clonazePAM  (KLONOPIN) 1 MG tablet Take 1 mg by mouth 2 (two) times daily.     . ferrous sulfate 325 (65 FE) MG tablet Take 650 mg by mouth 2 (two) times daily.     . haloperidol (HALDOL) 5 MG tablet 1 tab in AM and 3 tab at HS    . ibuprofen (ADVIL,MOTRIN) 200 MG tablet Take 800 mg by mouth every 6 (six) hours as needed for moderate pain.    . meloxicam (MOBIC) 7.5 MG tablet TAKE 1 TO 2 TABLETS BY MOUTH EVERY DAY AS NEEDED PAIN  1  . Multiple Vitamins-Minerals (HAIR/SKIN/NAILS/BIOTIN) TABS Take 1 tablet by mouth 2 (two) times daily.    Marland Kitchen NUVIGIL 250 MG tablet Take 250 mg by mouth daily.  5  . ondansetron (ZOFRAN ODT) 4 MG disintegrating tablet Take 1 tablet (4 mg total) by mouth every 8 (eight) hours as needed for nausea or vomiting. 40 tablet 0  . QUEtiapine (SEROQUEL) 300 MG tablet 2 hs    . valACYclovir (VALTREX) 500 MG tablet Take 500 mg by mouth daily as needed (fever blister).     Marland Kitchen zolpidem (AMBIEN) 10 MG tablet Take 10 mg by mouth at bedtime.     . buprenorphine (BUTRANS) 10 MCG/HR PTWK patch Place 10 mcg onto the skin once a week.    . sulfamethoxazole-trimethoprim (BACTRIM DS,SEPTRA DS) 800-160 MG tablet Take  1 tablet by mouth 2 (two) times daily. 20 tablet 0   No facility-administered medications prior to visit.    Allergies:  Allergies  Allergen Reactions  . Nsaids   . Tylenol [Acetaminophen] Nausea And Vomiting      Review of Systems: See HPI for pertinent ROS. All other ROS negative.    Physical Exam: Blood pressure 134/80, pulse 88, temperature 98 F (36.7 C), temperature source Oral, resp. rate 20, weight 227 lb (102.967 kg)., Body mass index is 38.38 kg/(m^2). General:  WF. Appears in no acute distress. Neck: Supple. No thyromegaly. No lymphadenopathy. Lungs: Clear bilaterally to auscultation without wheezes, rales, or rhonchi. Breathing is unlabored. Heart: Regular rhythm. No murmurs, rubs, or gallops. Msk:  Strength and tone normal for age Skin: On the Abdomen: Approx 1-2  cm to the left of the umbilicus, there is a long verticle scar.  It is on part of this scar, that she has area of erythema and area of scab. The area of erythema measures 1 cm horizontally and 2 cm vertically.  Within this area of erythema, there are 2 small scabbed areas.  There is no sign of abscess. Note: there is another scar from a laparoscopic surgery near the scar that is discussed above. The scar from the laparoscopic surgery appears normal with no erythema, no drainage.  Neuro: Alert and oriented X 3. Moves all extremities spontaneously. Gait is normal. CNII-XII grossly in tact. Psych:  Responds to questions appropriately with a normal affect.     ASSESSMENT AND PLAN:  40 y.o. year old female with    1. Cellulitis of abdominal wall  I have called [facility].  We have scheduled her to see [name] tomorrow at 9:30 AM. I notified patient of this while she was still here in my office and she states that she can/will follow-up with [name] tomorrow at 9:30 AM. In the interim I will go ahead and send refill on her Bactrim so that she can get in 2 doses of this today and another dose of this tomorrow morning and then follow-up with [name]. She voices understanding and agrees to the above treatment plan. - sulfamethoxazole-trimethoprim (BACTRIM DS,SEPTRA DS) 800-160 MG tablet; Take 1 tablet by mouth 2 (two) times daily.  Dispense: 20 tablet; Refill: 0  2. Abdominal wall seroma, sequela  3. S/P laparoscopic sleeve gastrectomy 01/2014  ADDENDUM---ADDED 03/04/2015: Dr. Greer Pickerel sent me a copy of his recent office note dated 02/28/15. At that time regarding the area on her abdomen --- he felt that there was no cellulitis, induration, or fluctuance. Noted that this is the area of her underlying seroma cavity. She was nontender. at that point he planned local wound control and told her to use triple antibiotic ointment and cover with a light gauze. And for her to  return in 8 weeks to reassess.  He did make a note that she can use NSAIDs as needed for pain issues. She had a gastric sleeve not a gastric bypass so NSAIDs are acceptable.   [address] [place], [place], [facility] 02/27/2015 12:48 PM

## 2015-02-28 DIAGNOSIS — K219 Gastro-esophageal reflux disease without esophagitis: Secondary | ICD-10-CM | POA: Diagnosis not present

## 2015-02-28 DIAGNOSIS — E669 Obesity, unspecified: Secondary | ICD-10-CM | POA: Diagnosis not present

## 2015-02-28 DIAGNOSIS — F319 Bipolar disorder, unspecified: Secondary | ICD-10-CM | POA: Diagnosis not present

## 2015-02-28 DIAGNOSIS — E781 Pure hyperglyceridemia: Secondary | ICD-10-CM | POA: Diagnosis not present

## 2015-02-28 DIAGNOSIS — Z9884 Bariatric surgery status: Secondary | ICD-10-CM | POA: Diagnosis not present

## 2015-02-28 DIAGNOSIS — M47817 Spondylosis without myelopathy or radiculopathy, lumbosacral region: Secondary | ICD-10-CM | POA: Diagnosis not present

## 2015-03-06 DIAGNOSIS — F250 Schizoaffective disorder, bipolar type: Secondary | ICD-10-CM | POA: Diagnosis not present

## 2015-03-08 DIAGNOSIS — D509 Iron deficiency anemia, unspecified: Secondary | ICD-10-CM | POA: Diagnosis not present

## 2015-03-08 DIAGNOSIS — K912 Postsurgical malabsorption, not elsewhere classified: Secondary | ICD-10-CM | POA: Diagnosis not present

## 2015-03-12 ENCOUNTER — Encounter: Payer: Self-pay | Admitting: Dietician

## 2015-03-12 ENCOUNTER — Encounter: Payer: Medicare Other | Attending: Internal Medicine | Admitting: Dietician

## 2015-03-12 DIAGNOSIS — Z6832 Body mass index (BMI) 32.0-32.9, adult: Secondary | ICD-10-CM | POA: Insufficient documentation

## 2015-03-12 DIAGNOSIS — I10 Essential (primary) hypertension: Secondary | ICD-10-CM | POA: Insufficient documentation

## 2015-03-12 DIAGNOSIS — E669 Obesity, unspecified: Secondary | ICD-10-CM | POA: Insufficient documentation

## 2015-03-12 DIAGNOSIS — Z9884 Bariatric surgery status: Secondary | ICD-10-CM | POA: Insufficient documentation

## 2015-03-12 DIAGNOSIS — D509 Iron deficiency anemia, unspecified: Secondary | ICD-10-CM | POA: Diagnosis not present

## 2015-03-12 DIAGNOSIS — Z713 Dietary counseling and surveillance: Secondary | ICD-10-CM | POA: Insufficient documentation

## 2015-03-12 DIAGNOSIS — Z48815 Encounter for surgical aftercare following surgery on the digestive system: Secondary | ICD-10-CM | POA: Diagnosis not present

## 2015-03-12 DIAGNOSIS — K219 Gastro-esophageal reflux disease without esophagitis: Secondary | ICD-10-CM | POA: Insufficient documentation

## 2015-03-12 NOTE — Progress Notes (Signed)
  Follow-up visit:  13 months Post-Operative Gastric Sleeve Surgery  Medical Nutrition Therapy:  Appt start time: [id number] end time: 1020  Primary concerns today: Post-operative Bariatric Surgery Nutrition Management. Denica returns stating her weight has been fluctuating a lot. Has a lot of stress at home and has been "stress eating." Has noticed that when she buys treats like ice cream and cookies she has a hard time resisting them and snack on them at night. However, she has begun having Premier protein shakes again and is doing well with having protein foods at evening meal.   Samples provided and patient instructed on proper use: Unjury protein powder (strawberry - qty 1) Lot#: [id number] Exp: 12/2015  Surgery date: 02/13/2014 Surgery type: Sleeve Start weight at [facility]: 314 lbs on 06/30/2013 Weight today: 223.5 lbs Weight change: 3 lbs gain Total weight lost: 90.5 lbs   Preferred Learning Style:   No preference indicated   Learning Readiness:   Ready  24-hr recall: B (7AM): Special K cereal with strawberries, Splenda, and sometimes grapes (had a Premier protein shake this morning) L: Sugar free jello with Cool Whip D: chicken or steak or salmon patties or hot dog on bun with chili and slaw Snk: cookies and milk  Fluid intake: 64 ounces (Coke, [employer], diet Sundrop, juice, chocolate milk, water "at night with medicines"); 1% milk Estimated total protein intake: difficult to assess  Medications: see list Supplementation: taking "sometimes"  Drinking while eating: no Hair loss: yes, severe Carbonated beverages: diet sodas  Recent physical activity:  none  Progress Towards Goal(s):  Some progress.   Nutritional Diagnosis:  [place]-3.3 Overweight/obesity related to past poor dietary habits and physical inactivity as evidenced by patient w/ recent gastric sleeve surgery following dietary guidelines for continued weight loss.  Intervention:  Nutrition counseling  provided. Goals: -Avoid buying candy, cookies, and ice cream! -Try making Jello with protein powder -Plan to have a Premier protein shake every day  Teaching Method Utilized:  Visual Auditory Hands on  Barriers to learning/adherence to lifestyle change: unable to meet protein needs  Demonstrated degree of understanding via:  Teach Back   Monitoring/Evaluation:  Dietary intake, exercise, and body weight. Follow up in 4 weeks for 14 month post-op visit.

## 2015-03-12 NOTE — Patient Instructions (Addendum)
-  Try making your own chocolate milk with sugar free chocolate syrup and chocolate protein powder  -Increase protein and reduce carbs  -Cheese and deli meat, eggs, [place] sausage, chili, chicken salad, Jimmy Dean frittatas  -Eat meats first    -Practice chewing thoroughly, eating slowly, and taking tiny bites  -Try to eat a protein food every time you eat  -Work on cutting back on sodas, juice, [employer], and other sugary drinks  -Drink more water and sugar free flavoring  -Avoid buying candy, cookies, and ice cream! -Try making Jello with protein powder -Plan to have a Premier protein shake every day  Grocery list: -Sugar free cherry [employer] -Deli meat -Cheese (American singles and Laughing cow) -Consulting civil engineer frozen frittatas -Danton Clap fully cooked [place] sausage -Sugar free chocolate syrup -Chicken salad -Eggs

## 2015-03-19 DIAGNOSIS — M47817 Spondylosis without myelopathy or radiculopathy, lumbosacral region: Secondary | ICD-10-CM | POA: Diagnosis not present

## 2015-03-19 DIAGNOSIS — M170 Bilateral primary osteoarthritis of knee: Secondary | ICD-10-CM | POA: Diagnosis not present

## 2015-03-19 DIAGNOSIS — M5417 Radiculopathy, lumbosacral region: Secondary | ICD-10-CM | POA: Diagnosis not present

## 2015-03-19 DIAGNOSIS — G894 Chronic pain syndrome: Secondary | ICD-10-CM | POA: Diagnosis not present

## 2015-03-25 DIAGNOSIS — F3160 Bipolar disorder, current episode mixed, unspecified: Secondary | ICD-10-CM | POA: Diagnosis not present

## 2015-04-02 DIAGNOSIS — M722 Plantar fascial fibromatosis: Secondary | ICD-10-CM | POA: Diagnosis not present

## 2015-04-03 ENCOUNTER — Encounter: Payer: Self-pay | Admitting: Physician Assistant

## 2015-04-03 ENCOUNTER — Ambulatory Visit (INDEPENDENT_AMBULATORY_CARE_PROVIDER_SITE_OTHER): Payer: Medicare Other | Admitting: Physician Assistant

## 2015-04-03 VITALS — BP 112/76 | HR 84 | Temp 97.7°F | Resp 18 | Wt 225.0 lb

## 2015-04-03 DIAGNOSIS — L03311 Cellulitis of abdominal wall: Secondary | ICD-10-CM | POA: Diagnosis not present

## 2015-04-03 MED ORDER — SULFAMETHOXAZOLE-TRIMETHOPRIM 800-160 MG PO TABS: 1.0000 | ORAL_TABLET | Freq: Two times a day (BID) | ORAL | Status: DC

## 2015-04-03 NOTE — Progress Notes (Signed)
Patient ID: [name] MRN: [id number], DOB: January 02, 1976, 40 y.o. Date of Encounter: 04/03/2015, 11:06 AM    Chief Complaint:  Chief Complaint  Patient presents with  . 3 mth check up    recheck abd wound     HPI: 40 y.o. year old white female presents with above.  THE FOLLOWING IS COPIED FROM HER OV NOTE WITH ME 02/11/2015: She has had multiple abdominal surgeries over the years. She states that this particular scar is from a surgery that was done 10 years ago. She says that she has not had any problems with this scar until now. Says that she just started noticing it getting red and irritated 5 days ago. Says that she does not know why it is having issues at this point. Is not aware of any trauma or injury to the site and says that she "has not been picking at it."  At that visit ,culture was obtained/sent. At that visit,  Bactrim was prescribed.  Subsequently, culture showed staph aureus which was sensitive to Bactrim.  OV 02/27/2015: Patient states that she took the Bactrim as directed and completed it.  However, site has not resolved and still looks similar to it what it looked like at last visit, so patient came in for follow-up evaluation.  Today I told her that I am concerned that what we are seeing is the surface of an underlying infection and concern that she could possibly have infection in her abdomen. She states that in the past she did have that problem---- that back then she would press on area and infection would come out. Says she has had problem with seroma.  When she recently established care with me, I requested recent records from [name], her surgeon at [facility].  I recall those records indicating seroma.  At that visit: I have called [facility].  We have scheduled her to see [name] tomorrow at 9:30 AM. I notified patient of this while she was still here in my office and she states that she can/will follow-up with  [name] tomorrow at 9:30 AM. In the interim I will go ahead and send refill on her Bactrim so that she can get in 2 doses of this today and another dose of this tomorrow morning and then follow-up with [name]. She voices understanding and agrees to the above treatment plan. - sulfamethoxazole-trimethoprim (BACTRIM DS,SEPTRA DS) 800-160 MG tablet; Take 1 tablet by mouth 2 (two) times daily.  Dispense: 20 tablet; Refill: 0  ADDENDUM---ADDED 03/04/2015: Dr. Greer Pickerel sent me a copy of his recent office note dated 02/28/15. At that time regarding the area on her abdomen --- he felt that there was no cellulitis, induration, or fluctuance. Noted that this is the area of her underlying seroma cavity. She was nontender. at that point he planned local wound control and told her to use triple antibiotic ointment and cover with a light gauze. And for her to return in 8 weeks to reassess.  He did make a note that she can use NSAIDs as needed for pain issues. She had a gastric sleeve not a gastric bypass so NSAIDs are acceptable.  Today---04/03/2015: Today patient says that she is here to see about getting a refill on the Bactrim. Says that the site on her abdomen has not healed. Says that her last visit with [name] was last week and does not have a follow-up visit scheduled with him yet. Says that she  has had no further treatment for this--- since treatment stated above.  No other complaints or concerns today.  Home Meds:   Outpatient Prescriptions Prior to Visit  Medication Sig Dispense Refill  . benztropine (COGENTIN) 2 MG tablet Take 2 mg by mouth.    [name] 15 MCG/HR PTWK APPLY 1 PATCH TOPICALLY ONCE A WEEK  0  . Calcium Carbonate-Vitamin D (CALCIUM + D PO) Take 1 tablet by mouth daily.    . clonazePAM (KLONOPIN) 1 MG tablet Take 1 mg by mouth 2 (two) times daily.     . ferrous sulfate 325 (65 FE) MG tablet Take 650 mg by mouth 2 (two) times daily.     . haloperidol (HALDOL) 5 MG tablet 1  tab in AM and 3 tab at HS    . meloxicam (MOBIC) 7.5 MG tablet TAKE 1 TO 2 TABLETS BY MOUTH EVERY DAY AS NEEDED PAIN  1  . Multiple Vitamins-Minerals (HAIR/SKIN/NAILS/BIOTIN) TABS Take 1 tablet by mouth 2 (two) times daily.    Marland Kitchen NUVIGIL 250 MG tablet Take 250 mg by mouth daily.  5  . ondansetron (ZOFRAN ODT) 4 MG disintegrating tablet Take 1 tablet (4 mg total) by mouth every 8 (eight) hours as needed for nausea or vomiting. 40 tablet 0  . QUEtiapine (SEROQUEL) 300 MG tablet 2 hs    . valACYclovir (VALTREX) 500 MG tablet Take 500 mg by mouth daily as needed (fever blister).     Marland Kitchen zolpidem (AMBIEN) 10 MG tablet Take 10 mg by mouth at bedtime.     . sulfamethoxazole-trimethoprim (BACTRIM DS,SEPTRA DS) 800-160 MG tablet Take 1 tablet by mouth 2 (two) times daily. 20 tablet 0  . ibuprofen (ADVIL,MOTRIN) 200 MG tablet Take 800 mg by mouth every 6 (six) hours as needed for moderate pain. Reported on 04/03/2015     No facility-administered medications prior to visit.    Allergies:  Allergies  Allergen Reactions  . Nsaids   . Tylenol [Acetaminophen] Nausea And Vomiting      Review of Systems: See HPI for pertinent ROS. All other ROS negative.    Physical Exam: Blood pressure 112/76, pulse 84, temperature 97.7 F (36.5 C), temperature source Oral, resp. rate 18, weight 225 lb (102.059 kg)., Body mass index is 38.04 kg/(m^2). General:  WF. Appears in no acute distress. Neck: Supple. No thyromegaly. No lymphadenopathy. Lungs: Clear bilaterally to auscultation without wheezes, rales, or rhonchi. Breathing is unlabored. Heart: Regular rhythm. No murmurs, rubs, or gallops. Msk:  Strength and tone normal for age Skin: On the Abdomen: Approx 1-2 cm to the left of the umbilicus, there is a long verticle scar.  It is on part of this scar, that she has area of erythema. The area of erythema measures 1 cm horizontally and 2 cm vertically.   There is no sign of abscess. There is no drainage present.    Note: there is another scar from a laparoscopic surgery near the scar that is discussed above. The scar from the laparoscopic surgery appears normal with no erythema, no drainage.  Neuro: Alert and oriented X 3. Moves all extremities spontaneously. Gait is normal. CNII-XII grossly in tact. Psych:  Responds to questions appropriately with a normal affect.     ASSESSMENT AND PLAN:  40 y.o. year old female with   1. Cellulitis of abdominal wall Today she is wearing spandex type exercise type pants. She is wearing them so that they come up high enough that they are in the crease (  between rolls of fat) in her abdomen where this "wound" is located.  Explained to her that she needs to fold these pants down so that they are not rubbing the skin in this location.  Explained that there is no way that the skin can heal if it is constantly being rubbed by clothing.  Also needs to apply neosporin ointment routinely. He states that she has a large tube of antibacterial ointment but has not been applying it recently.  She is to proceed with this wound care. If worsens then can take some Bactrim so I will send a refill in case she needs it but otherwise she needs local wound care to control this. - sulfamethoxazole-trimethoprim (BACTRIM DS,SEPTRA DS) 800-160 MG tablet; Take 1 tablet by mouth 2 (two) times daily.  Dispense: 14 tablet; Refill: 0  2. Abdominal wall seroma, sequela  3. S/P laparoscopic sleeve gastrectomy 01/2014   SignedOlean Ree [place], [place], [facility] 04/03/2015 11:06 AM

## 2015-04-04 DIAGNOSIS — F3160 Bipolar disorder, current episode mixed, unspecified: Secondary | ICD-10-CM | POA: Diagnosis not present

## 2015-04-08 ENCOUNTER — Encounter: Payer: Medicare Other | Attending: Internal Medicine | Admitting: Dietician

## 2015-04-08 ENCOUNTER — Encounter: Payer: Self-pay | Admitting: Dietician

## 2015-04-08 DIAGNOSIS — Z713 Dietary counseling and surveillance: Secondary | ICD-10-CM | POA: Diagnosis not present

## 2015-04-08 DIAGNOSIS — Z9884 Bariatric surgery status: Secondary | ICD-10-CM | POA: Insufficient documentation

## 2015-04-08 DIAGNOSIS — Z48815 Encounter for surgical aftercare following surgery on the digestive system: Secondary | ICD-10-CM | POA: Diagnosis not present

## 2015-04-08 DIAGNOSIS — K219 Gastro-esophageal reflux disease without esophagitis: Secondary | ICD-10-CM | POA: Diagnosis not present

## 2015-04-08 DIAGNOSIS — Z6832 Body mass index (BMI) 32.0-32.9, adult: Secondary | ICD-10-CM | POA: Diagnosis not present

## 2015-04-08 DIAGNOSIS — D509 Iron deficiency anemia, unspecified: Secondary | ICD-10-CM | POA: Insufficient documentation

## 2015-04-08 DIAGNOSIS — I10 Essential (primary) hypertension: Secondary | ICD-10-CM | POA: Diagnosis not present

## 2015-04-08 DIAGNOSIS — E669 Obesity, unspecified: Secondary | ICD-10-CM | POA: Diagnosis not present

## 2015-04-08 NOTE — Patient Instructions (Addendum)
-  Try making your own chocolate milk with sugar free chocolate syrup and chocolate protein powder  -Increase protein and reduce carbs  -Cheese and deli meat, eggs, [place] sausage, chili, chicken salad, Jimmy Dean frittatas  -Eat meats first    -Practice chewing thoroughly, eating slowly, and taking tiny bites  -Try to eat a protein food every time you eat  -Work on cutting back on sodas, juice, [employer], and other sugary drinks  -Drink more water and sugar free flavoring  -Avoid buying candy, cookies, and ice cream! -Plan to have a Premier protein shake every day -Try using vanilla Premier protein shake as coffee creamer -Avoid taking laxatives (take Miralax only if you are constipated)  Grocery list: -Sugar free cherry Kool Aid water flavoring -Deli meat -Cheese (American singles and Laughing cow) -Consulting civil engineer frozen frittatas -Danton Clap fully cooked [place] sausage -Sugar free chocolate syrup -Chicken salad -Eggs

## 2015-04-08 NOTE — Progress Notes (Signed)
  Follow-up visit:  14 months Post-Operative Gastric Sleeve Surgery  Medical Nutrition Therapy:  Appt start time: [id number] end time: 1150  Primary concerns today: Post-operative Bariatric Surgery Nutrition Management. [name] returns having maintained her weight. She reports still having trouble with infection in her abdomen. Has an appointment with [name] on Monday. Tried making Jello with Unjury protein powder and did not like it. She reports that she overate during the holidays and is finding it hard to get back on track and also feels like she is able to tolerate a larger volume of food. Has been working on avoiding buying tempting foods. Got some Premier protein shakes but drank them all; needs to get more. Having some constipation and takes laxatives. [name] also states that she sometimes takes laxatives to "get rid of the food she just ate." I explained to the patient that laxative abuse is dangerous and that all calories are absorbed once they are eaten regardless of vomiting and laxative use.     Surgery date: 02/13/2014 Surgery type: Sleeve Start weight at [facility]: 314 lbs on 06/30/2013 Weight today: 223.9 lbs Weight change: 0 lbs Total weight lost: 90 lbs   Preferred Learning Style:   No preference indicated   Learning Readiness:   Ready  24-hr recall: B (7AM): Bacon and eggs, 1 biscuit OR Special K with 1% milk and Splenda L: sometimes skips, Sugar free jello with Cool Whip D: chicken or steak or salmon patties or hot dog on bun with chili and slaw Snk: cookies and milk  Fluid intake: 64 ounces (Coke, [employer], diet Sundrop, juice, chocolate milk, water "at night with medicines"); 1% milk Estimated total protein intake: difficult to assess  Medications: see list Supplementation: taking "sometimes"  Drinking while eating: no Hair loss: yes, severe Carbonated beverages: diet sodas  Recent physical activity:  none  Progress Towards Goal(s):  Some  progress.   Nutritional Diagnosis:  [place]-3.3 Overweight/obesity related to past poor dietary habits and physical inactivity as evidenced by patient w/ recent gastric sleeve surgery following dietary guidelines for continued weight loss.  Intervention:  Nutrition counseling provided. Goals: -Avoid buying candy, cookies, and ice cream! -Plan to have a Premier protein shake every day -Try using vanilla Premier protein shake as coffee creamer -Avoid taking laxatives (take Miralax only if you are constipated)  Teaching Method Utilized:  Visual Auditory Hands on  Barriers to learning/adherence to lifestyle change: unable to meet protein needs  Demonstrated degree of understanding via:  Teach Back   Monitoring/Evaluation:  Dietary intake, exercise, and body weight. Follow up in 4 weeks for 15 month post-op visit.

## 2015-04-15 DIAGNOSIS — F319 Bipolar disorder, unspecified: Secondary | ICD-10-CM | POA: Diagnosis not present

## 2015-04-15 DIAGNOSIS — Z9884 Bariatric surgery status: Secondary | ICD-10-CM | POA: Diagnosis not present

## 2015-04-15 DIAGNOSIS — E781 Pure hyperglyceridemia: Secondary | ICD-10-CM | POA: Diagnosis not present

## 2015-04-15 DIAGNOSIS — K912 Postsurgical malabsorption, not elsewhere classified: Secondary | ICD-10-CM | POA: Diagnosis not present

## 2015-04-15 DIAGNOSIS — E669 Obesity, unspecified: Secondary | ICD-10-CM | POA: Diagnosis not present

## 2015-04-16 ENCOUNTER — Ambulatory Visit: Payer: Medicare Other | Admitting: Advanced Practice Midwife

## 2015-04-16 DIAGNOSIS — M47817 Spondylosis without myelopathy or radiculopathy, lumbosacral region: Secondary | ICD-10-CM | POA: Diagnosis not present

## 2015-04-16 DIAGNOSIS — M170 Bilateral primary osteoarthritis of knee: Secondary | ICD-10-CM | POA: Diagnosis not present

## 2015-04-16 DIAGNOSIS — M5417 Radiculopathy, lumbosacral region: Secondary | ICD-10-CM | POA: Diagnosis not present

## 2015-04-16 DIAGNOSIS — Z79891 Long term (current) use of opiate analgesic: Secondary | ICD-10-CM | POA: Diagnosis not present

## 2015-04-16 DIAGNOSIS — G894 Chronic pain syndrome: Secondary | ICD-10-CM | POA: Diagnosis not present

## 2015-04-18 ENCOUNTER — Ambulatory Visit: Payer: Medicare Other | Admitting: Advanced Practice Midwife

## 2015-04-19 ENCOUNTER — Other Ambulatory Visit: Payer: Medicare Other

## 2015-04-22 ENCOUNTER — Ambulatory Visit
Admission: RE | Admit: 2015-04-22 | Discharge: 2015-04-22 | Disposition: A | Discharge status: Home / Self Care | Payer: Medicare Other | Source: Ambulatory Visit | Attending: General Surgery | Admitting: General Surgery

## 2015-04-22 DIAGNOSIS — R1084 Generalized abdominal pain: Secondary | ICD-10-CM | POA: Diagnosis not present

## 2015-04-22 MED ORDER — IOPAMIDOL (ISOVUE-300) INJECTION 61%
125.0000 mL | Freq: Once | INTRAVENOUS | Status: AC | PRN
  Administered 2015-04-22: 125 mL via INTRAVENOUS

## 2015-04-29 ENCOUNTER — Encounter: Payer: Medicare Other | Admitting: Oncology

## 2015-05-09 DIAGNOSIS — M170 Bilateral primary osteoarthritis of knee: Secondary | ICD-10-CM | POA: Diagnosis not present

## 2015-05-09 DIAGNOSIS — G894 Chronic pain syndrome: Secondary | ICD-10-CM | POA: Diagnosis not present

## 2015-05-09 DIAGNOSIS — M47817 Spondylosis without myelopathy or radiculopathy, lumbosacral region: Secondary | ICD-10-CM | POA: Diagnosis not present

## 2015-05-09 DIAGNOSIS — M5417 Radiculopathy, lumbosacral region: Secondary | ICD-10-CM | POA: Diagnosis not present

## 2015-05-15 DIAGNOSIS — F3164 Bipolar disorder, current episode mixed, severe, with psychotic features: Secondary | ICD-10-CM | POA: Diagnosis not present

## 2015-05-21 DIAGNOSIS — F3160 Bipolar disorder, current episode mixed, unspecified: Secondary | ICD-10-CM | POA: Diagnosis not present

## 2015-05-23 ENCOUNTER — Encounter: Payer: Medicare Other | Attending: Internal Medicine | Admitting: Dietician

## 2015-05-23 ENCOUNTER — Encounter: Payer: Self-pay | Admitting: Dietician

## 2015-05-23 DIAGNOSIS — K219 Gastro-esophageal reflux disease without esophagitis: Secondary | ICD-10-CM | POA: Diagnosis not present

## 2015-05-23 DIAGNOSIS — I10 Essential (primary) hypertension: Secondary | ICD-10-CM | POA: Insufficient documentation

## 2015-05-23 DIAGNOSIS — Z48815 Encounter for surgical aftercare following surgery on the digestive system: Secondary | ICD-10-CM | POA: Insufficient documentation

## 2015-05-23 DIAGNOSIS — Z9884 Bariatric surgery status: Secondary | ICD-10-CM | POA: Insufficient documentation

## 2015-05-23 DIAGNOSIS — Z6832 Body mass index (BMI) 32.0-32.9, adult: Secondary | ICD-10-CM | POA: Insufficient documentation

## 2015-05-23 DIAGNOSIS — E669 Obesity, unspecified: Secondary | ICD-10-CM | POA: Diagnosis not present

## 2015-05-23 DIAGNOSIS — D509 Iron deficiency anemia, unspecified: Secondary | ICD-10-CM | POA: Diagnosis not present

## 2015-05-23 DIAGNOSIS — Z713 Dietary counseling and surveillance: Secondary | ICD-10-CM | POA: Insufficient documentation

## 2015-05-23 NOTE — Patient Instructions (Addendum)
-  Try making your own chocolate milk with sugar free chocolate syrup and chocolate protein powder  -Increase protein and reduce carbs  -Cheese and deli meat, eggs, [place] sausage, chili, chicken salad, Jimmy Dean frittatas  -Eat meats first    -Practice chewing thoroughly, eating slowly, and taking tiny bites  -Try to eat a protein food every time you eat  -Work on cutting back on sodas, juice, [employer], and other sugary drinks  -Drink more water and sugar free flavoring  -Avoid buying candy, cookies, and ice cream! -Plan to have a Premier protein shake every day -Try using vanilla Premier protein shake as coffee creamer -Avoid taking laxatives (take Miralax only if you are constipated)  -Make extra food on the weekends and have leftovers during the week (chicken, pork chops, salad, tacos, breakfast for dinner)   Grocery list: -Sugar free cherry Kool Aid water flavoring -Deli meat -Cheese (American singles and Laughing cow) -Consulting civil engineer frozen frittatas -Danton Clap fully cooked [place] sausage -Sugar free chocolate syrup -Chicken salad -Eggs

## 2015-05-23 NOTE — Progress Notes (Signed)
  Follow-up visit:  15 months Post-Operative Gastric Sleeve Surgery  Medical Nutrition Therapy:  Appt start time: 1000 end time: 1030  Primary concerns today: Post-operative Bariatric Surgery Nutrition Management. Chardae returns having gained 4 pounds since last visit. She reports she has been feeling depressed and has been overeating for emotional reasons. Saw both her therapist and psychiatrist this week. She is still on the same medications. Tried Jimmy Dean fritattas and liked them. Saw [name] last month. Still having trouble with old incision that she states is leaking blood and pus. Gets bloated and gassy with dairy products like milk and ice cream.  Goal: 220 lbs by June    Surgery date: 02/13/2014 Surgery type: Sleeve Start weight at [facility]: 314 lbs on 06/30/2013 Weight today: 228.6 Weight change: 4 lbs gained Total weight lost: 85.4 lbs   Preferred Learning Style:   No preference indicated   Learning Readiness:   Ready  24-hr recall: B (7AM): Special K with raisins and 1% milk and Splenda L: sometimes skips, Sugar free jello with Cool Whip D: chicken or steak or salmon patties or hot dog on bun with chili and slaw Snk: Danton Clap Frittatas  Fluid intake: 64 ounces (Coke, [employer], diet Sundrop, juice, chocolate milk, water "at night with medicines"); 1% milk Estimated total protein intake: difficult to assess  Medications: see list Supplementation: taking "sometimes"  Drinking while eating: no Hair loss: yes, severe Carbonated beverages: diet sodas  Recent physical activity:  none  Progress Towards Goal(s):  Some progress.   Nutritional Diagnosis:  [place]-3.3 Overweight/obesity related to past poor dietary habits and physical inactivity as evidenced by patient w/ recent gastric sleeve surgery following dietary guidelines for continued weight loss.  Intervention:  Nutrition counseling provided. Goals: -Avoid buying candy, cookies, and ice cream! -Plan to  have a Premier protein shake every day -Try using vanilla Premier protein shake as coffee creamer -Avoid taking laxatives (take Miralax only if you are constipated)  Teaching Method Utilized:  Visual Auditory Hands on  Barriers to learning/adherence to lifestyle change: unable to meet protein needs  Demonstrated degree of understanding via:  Teach Back   Monitoring/Evaluation:  Dietary intake, exercise, and body weight. Follow up in 6 weeks for 16 month post-op visit.

## 2015-05-27 ENCOUNTER — Encounter: Payer: Self-pay | Admitting: Physician Assistant

## 2015-05-27 ENCOUNTER — Ambulatory Visit (INDEPENDENT_AMBULATORY_CARE_PROVIDER_SITE_OTHER): Payer: Medicare Other | Admitting: Physician Assistant

## 2015-05-27 VITALS — BP 114/60 | HR 88 | Temp 97.2°F | Resp 18 | Wt 230.0 lb

## 2015-05-27 DIAGNOSIS — L700 Acne vulgaris: Secondary | ICD-10-CM

## 2015-05-27 MED ORDER — CLINDAMYCIN PHOS-BENZOYL PEROX 1-5 % EX GEL: Freq: Two times a day (BID) | CUTANEOUS | Status: DC

## 2015-05-28 ENCOUNTER — Telehealth: Payer: Self-pay | Admitting: Family Medicine

## 2015-05-28 NOTE — Telephone Encounter (Signed)
Acne medication you prescribed  Is not covered by her insurance in any way.  Can you recommend something else??

## 2015-05-28 NOTE — Telephone Encounter (Signed)
She is Medicare, so probably has a Part D plan.  Doubt they cover any acne meds.  Will try to find out.

## 2015-05-28 NOTE — Progress Notes (Signed)
Patient ID: [name] MRN: [id number], DOB: 08/07/1975, 40 y.o. Date of Encounter: 05/28/2015, 4:27 PM    Chief Complaint:  Chief Complaint  Patient presents with  . still draining from abs incision  . acne on face and back     HPI: 40 y.o. year old female presents with above.   Says that her surgeon, [name], did CT that "showed that the seroma has collapsed" and he has continued to tell her to apply Neosporin ointment to site on abdomen."  Says she recently had IUD removed. Since then, having acne on face and upper back. Needs Rx for acne med.     Home Meds:   Outpatient Prescriptions Prior to Visit  Medication Sig Dispense Refill  . benztropine (COGENTIN) 2 MG tablet Take 2 mg by mouth.    [name] 15 MCG/HR PTWK APPLY 1 PATCH TOPICALLY ONCE A WEEK  0  . Calcium Carbonate-Vitamin D (CALCIUM + D PO) Take 1 tablet by mouth daily.    . clonazePAM (KLONOPIN) 1 MG tablet Take 1 mg by mouth 2 (two) times daily.     . ferrous sulfate 325 (65 FE) MG tablet Take 650 mg by mouth 2 (two) times daily.     . haloperidol (HALDOL) 5 MG tablet 1 tab in AM and 3 tab at HS    . meloxicam (MOBIC) 7.5 MG tablet TAKE 1 TO 2 TABLETS BY MOUTH EVERY DAY AS NEEDED PAIN  1  . Multiple Vitamins-Minerals (HAIR/SKIN/NAILS/BIOTIN) TABS Take 1 tablet by mouth 2 (two) times daily.    Marland Kitchen NUVIGIL 250 MG tablet Take 250 mg by mouth daily.  5  . ondansetron (ZOFRAN ODT) 4 MG disintegrating tablet Take 1 tablet (4 mg total) by mouth every 8 (eight) hours as needed for nausea or vomiting. 40 tablet 0  . QUEtiapine (SEROQUEL) 300 MG tablet 2 hs    . valACYclovir (VALTREX) 500 MG tablet Take 500 mg by mouth daily as needed (fever blister).     Marland Kitchen zolpidem (AMBIEN) 10 MG tablet Take 10 mg by mouth at bedtime.     . sulfamethoxazole-trimethoprim (BACTRIM DS,SEPTRA DS) 800-160 MG tablet Take 1 tablet by mouth 2 (two) times daily. 14 tablet 0   No facility-administered medications prior to visit.     Allergies:  Allergies  Allergen Reactions  . Nsaids   . Tylenol [Acetaminophen] Nausea And Vomiting      Review of Systems: See HPI for pertinent ROS. All other ROS negative.    Physical Exam: Blood pressure 114/60, pulse 88, temperature 97.2 F (36.2 C), temperature source Oral, resp. rate 18, weight 230 lb (104.327 kg)., Body mass index is 38.88 kg/(m^2). General:  WNWD WF. Appears in no acute distress. Neck: Supple. No thyromegaly. No lymphadenopathy. Lungs: Clear bilaterally to auscultation without wheezes, rales, or rhonchi. Breathing is unlabored. Heart: Regular rhythm. No murmurs, rubs, or gallops. Msk:  Strength and tone normal for age. Skin: She has very little acne on face. On her upper back, she does have scattered acne papules. No cystic acne.  Abdomen: She has bandage covering site. There is approx 2 cm x 3 mm area of pink scar and small amount of serous drainage from center of this. Neuro: Alert and oriented X 3. Moves all extremities spontaneously. Gait is normal. CNII-XII grossly in tact. Psych:  Responds to questions appropriately with a normal affect.     ASSESSMENT AND PLAN:  40 y.o. year old female with  1. Acne  vulgaris - clindamycin-benzoyl peroxide (BENZACLIN) gel; Apply topically 2 (two) times daily.  Dispense: 25 g; Refill: 0  2. Abdominal Site: --stable. Looks same as it did at prior OVs, months ago. Has been evaluated by surgeon. Cont topical antibacterial ointment.   [address] [place], [place], [facility] 05/28/2015 4:27 PM

## 2015-05-28 NOTE — Telephone Encounter (Signed)
I prescribed BenzaClin, which I thought was even covered by Medicaid. What is covered?

## 2015-06-05 NOTE — Telephone Encounter (Signed)
Cleocin T Gel--- Apply BID to affected areas. Disp # 30 grams + 1 refill

## 2015-06-05 NOTE — Telephone Encounter (Signed)
Patient Part D is Metall[employer].   Formulary Alternatives include: Benzac Cleocin Benzamycin Klaron Retin-A [employer]  Please advise.

## 2015-06-06 DIAGNOSIS — G894 Chronic pain syndrome: Secondary | ICD-10-CM | POA: Diagnosis not present

## 2015-06-06 DIAGNOSIS — M47817 Spondylosis without myelopathy or radiculopathy, lumbosacral region: Secondary | ICD-10-CM | POA: Diagnosis not present

## 2015-06-06 DIAGNOSIS — M170 Bilateral primary osteoarthritis of knee: Secondary | ICD-10-CM | POA: Diagnosis not present

## 2015-06-06 DIAGNOSIS — M5417 Radiculopathy, lumbosacral region: Secondary | ICD-10-CM | POA: Diagnosis not present

## 2015-06-06 MED ORDER — CLINDAMYCIN PHOSPHATE 1 % EX GEL: Freq: Two times a day (BID) | CUTANEOUS | Status: DC

## 2015-06-06 NOTE — Telephone Encounter (Signed)
New rx to pharmacy.

## 2015-06-06 NOTE — Telephone Encounter (Signed)
Left patient message that new RX has been sent in

## 2015-06-12 DIAGNOSIS — F3160 Bipolar disorder, current episode mixed, unspecified: Secondary | ICD-10-CM | POA: Diagnosis not present

## 2015-07-04 ENCOUNTER — Ambulatory Visit: Payer: Medicare Other | Admitting: Dietician

## 2015-07-05 DIAGNOSIS — M47817 Spondylosis without myelopathy or radiculopathy, lumbosacral region: Secondary | ICD-10-CM | POA: Diagnosis not present

## 2015-07-05 DIAGNOSIS — M170 Bilateral primary osteoarthritis of knee: Secondary | ICD-10-CM | POA: Diagnosis not present

## 2015-07-05 DIAGNOSIS — M5417 Radiculopathy, lumbosacral region: Secondary | ICD-10-CM | POA: Diagnosis not present

## 2015-07-05 DIAGNOSIS — G894 Chronic pain syndrome: Secondary | ICD-10-CM | POA: Diagnosis not present

## 2015-07-10 ENCOUNTER — Encounter: Payer: Medicare Other | Attending: Internal Medicine | Admitting: Dietician

## 2015-07-10 ENCOUNTER — Encounter: Payer: Self-pay | Admitting: Dietician

## 2015-07-10 DIAGNOSIS — K219 Gastro-esophageal reflux disease without esophagitis: Secondary | ICD-10-CM | POA: Diagnosis not present

## 2015-07-10 DIAGNOSIS — Z713 Dietary counseling and surveillance: Secondary | ICD-10-CM | POA: Diagnosis not present

## 2015-07-10 DIAGNOSIS — I10 Essential (primary) hypertension: Secondary | ICD-10-CM | POA: Diagnosis not present

## 2015-07-10 DIAGNOSIS — D509 Iron deficiency anemia, unspecified: Secondary | ICD-10-CM | POA: Insufficient documentation

## 2015-07-10 DIAGNOSIS — Z6832 Body mass index (BMI) 32.0-32.9, adult: Secondary | ICD-10-CM | POA: Insufficient documentation

## 2015-07-10 DIAGNOSIS — Z48815 Encounter for surgical aftercare following surgery on the digestive system: Secondary | ICD-10-CM | POA: Insufficient documentation

## 2015-07-10 DIAGNOSIS — Z9884 Bariatric surgery status: Secondary | ICD-10-CM | POA: Insufficient documentation

## 2015-07-10 DIAGNOSIS — E669 Obesity, unspecified: Secondary | ICD-10-CM | POA: Insufficient documentation

## 2015-07-10 NOTE — Progress Notes (Signed)
  Follow-up visit:  1.5 years Post-Operative Gastric Sleeve Surgery  Medical Nutrition Therapy:  Appt start time: [id number] end time: 1215  Primary concerns today: Post-operative Bariatric Surgery Nutrition Management. [name] returns having gained another 6 pounds since last visit. She continues to struggle with emotional eating and feels like she "binges." She reports that her medication makes her very hungry as well. Feeling lonely and irritable lately. Still struggling with abdominal wound and is disappointed that she may not be able to swim this summer.    Surgery date: 02/13/2014 Surgery type: Sleeve Start weight at [facility]: 314 lbs on 06/30/2013 Weight today: 234.6 Weight change: 6 lbs gained Total weight lost: 80 lbs   Preferred Learning Style:   No preference indicated   Learning Readiness:   Ready  24-hr recall: B (7AM): Special K with raisins and 1% milk and Splenda L: sometimes skips, Sugar free jello with Cool Whip D: chicken or steak or salmon patties or hot dog on bun with chili and slaw Snk: Danton Clap Frittatas  Fluid intake: 64 ounces (Coke, [employer], diet Sundrop, juice, chocolate milk, water "at night with medicines"); 1% milk Estimated total protein intake: difficult to assess  Medications: see list Supplementation: taking "sometimes"  Drinking while eating: no Hair loss: yes, severe Carbonated beverages: diet sodas  Recent physical activity:  none  Progress Towards Goal(s):  Some progress.   Nutritional Diagnosis:  [place]-3.3 Overweight/obesity related to past poor dietary habits and physical inactivity as evidenced by patient w/ recent gastric sleeve surgery following dietary guidelines for continued weight loss.  Intervention:  Nutrition counseling provided. Goals: -Talk to therapist about emotional eating -Make sure to eat enough in the beginning of the day -Work on finding a healthy hobby or something you enjoy  -Doctor, hospital Method  Utilized:  Visual Auditory Hands on  Barriers to learning/adherence to lifestyle change: emotional eating  Demonstrated degree of understanding via:  Teach Back   Monitoring/Evaluation:  Dietary intake, exercise, and body weight. Follow up in 6 weeks for 19 month post-op visit.

## 2015-07-10 NOTE — Patient Instructions (Addendum)
-  Talk to therapist about emotional eating -Make sure to eat enough in the beginning of the day  -Work on finding a healthy hobby or something you enjoy  -Scrapbooking

## 2015-07-30 ENCOUNTER — Telehealth: Payer: Self-pay | Admitting: Physician Assistant

## 2015-07-30 MED ORDER — VALACYCLOVIR HCL 500 MG PO TABS: 500.0000 mg | ORAL_TABLET | Freq: Every day | ORAL | Status: DC | PRN

## 2015-07-30 NOTE — Telephone Encounter (Signed)
[phone] Patient called to ask if valtrex can be prescribed for her if possible  [facility]

## 2015-07-30 NOTE — Telephone Encounter (Signed)
Prescription sent to pharmacy.

## 2015-08-02 DIAGNOSIS — M5417 Radiculopathy, lumbosacral region: Secondary | ICD-10-CM | POA: Diagnosis not present

## 2015-08-02 DIAGNOSIS — M170 Bilateral primary osteoarthritis of knee: Secondary | ICD-10-CM | POA: Diagnosis not present

## 2015-08-02 DIAGNOSIS — G894 Chronic pain syndrome: Secondary | ICD-10-CM | POA: Diagnosis not present

## 2015-08-02 DIAGNOSIS — M47817 Spondylosis without myelopathy or radiculopathy, lumbosacral region: Secondary | ICD-10-CM | POA: Diagnosis not present

## 2015-08-08 DIAGNOSIS — F3163 Bipolar disorder, current episode mixed, severe, without psychotic features: Secondary | ICD-10-CM | POA: Diagnosis not present

## 2015-08-14 DIAGNOSIS — F319 Bipolar disorder, unspecified: Secondary | ICD-10-CM | POA: Diagnosis not present

## 2015-08-14 DIAGNOSIS — G8929 Other chronic pain: Secondary | ICD-10-CM | POA: Diagnosis not present

## 2015-08-14 DIAGNOSIS — Z9884 Bariatric surgery status: Secondary | ICD-10-CM | POA: Diagnosis not present

## 2015-08-14 DIAGNOSIS — E669 Obesity, unspecified: Secondary | ICD-10-CM | POA: Diagnosis not present

## 2015-08-19 ENCOUNTER — Encounter: Payer: Medicare Other | Attending: Internal Medicine | Admitting: Dietician

## 2015-08-19 ENCOUNTER — Encounter: Payer: Self-pay | Admitting: Dietician

## 2015-08-19 DIAGNOSIS — Z48815 Encounter for surgical aftercare following surgery on the digestive system: Secondary | ICD-10-CM | POA: Diagnosis not present

## 2015-08-19 DIAGNOSIS — D509 Iron deficiency anemia, unspecified: Secondary | ICD-10-CM | POA: Insufficient documentation

## 2015-08-19 DIAGNOSIS — Z6832 Body mass index (BMI) 32.0-32.9, adult: Secondary | ICD-10-CM | POA: Diagnosis not present

## 2015-08-19 DIAGNOSIS — K219 Gastro-esophageal reflux disease without esophagitis: Secondary | ICD-10-CM | POA: Insufficient documentation

## 2015-08-19 DIAGNOSIS — E669 Obesity, unspecified: Secondary | ICD-10-CM | POA: Insufficient documentation

## 2015-08-19 DIAGNOSIS — I10 Essential (primary) hypertension: Secondary | ICD-10-CM | POA: Insufficient documentation

## 2015-08-19 DIAGNOSIS — Z713 Dietary counseling and surveillance: Secondary | ICD-10-CM | POA: Diagnosis not present

## 2015-08-19 DIAGNOSIS — Z9884 Bariatric surgery status: Secondary | ICD-10-CM | POA: Diagnosis not present

## 2015-08-19 NOTE — Progress Notes (Signed)
  Follow-up visit:  1.5 years Post-Operative Gastric Sleeve Surgery  Medical Nutrition Therapy:  Appt start time: 1020 end time: 1050  Primary concerns today: Post-operative Bariatric Surgery Nutrition Management. [name] returns having maintained her weight. She reports she has been feeling depressed because her son has been with his dad for a few weeks and will not return until mid July. [name] states she has been eating meats first and avoiding breads. However, she may snack on ice cream in the afternoons. Wants to take Adipex but states that it causes psychosis.    Surgery date: 02/13/2014 Surgery type: Sleeve Start weight at [facility]: 314 lbs on 06/30/2013 Weight today: 234.6 Weight change: 6 lbs gained Total weight lost: 80 lbs   Preferred Learning Style:   No preference indicated   Learning Readiness:   Ready  24-hr recall: B (7AM): Special K with raisins and 1% milk and Splenda L: 1 hot dog with bun  S: sugar free fudge bars, no sugar added ice cream  D: none Snk:  Fluid intake: 64 ounces (Coke, [employer], diet Sundrop, juice, chocolate milk, water "at night with medicines"); 1% milk Estimated total protein intake: difficult to assess  Medications: see list Supplementation: taking "sometimes"  Drinking while eating: no Hair loss: yes, severe Carbonated beverages: diet sodas  Recent physical activity:  none  Progress Towards Goal(s):  Some progress.   Nutritional Diagnosis:  [place]-3.3 Overweight/obesity related to past poor dietary habits and physical inactivity as evidenced by patient w/ recent gastric sleeve surgery following dietary guidelines for continued weight loss.  Intervention:  Nutrition counseling provided. Goals: -Talk to therapist about emotional eating -Make sure to eat enough in the beginning of the day -Work on finding a healthy hobby or something you enjoy  -Doctor, hospital Method Utilized:  Visual Auditory Hands on  Barriers to  learning/adherence to lifestyle change: emotional eating  Demonstrated degree of understanding via:  Teach Back   Monitoring/Evaluation:  Dietary intake, exercise, and body weight. Follow up in 1 month.

## 2015-08-19 NOTE — Patient Instructions (Addendum)
-  Try having a Premier protein shake instead of skipping a meal  -Talk to therapist about emotional eating -Make sure to eat enough in the beginning of the day  -Work on finding a healthy hobby or something you enjoy  -Scrapbooking

## 2015-08-28 ENCOUNTER — Ambulatory Visit (INDEPENDENT_AMBULATORY_CARE_PROVIDER_SITE_OTHER): Payer: Medicare Other | Admitting: Physician Assistant

## 2015-08-28 VITALS — BP 136/88 | Temp 98.1°F | Ht 64.5 in | Wt 236.0 lb

## 2015-08-28 DIAGNOSIS — T8189XD Other complications of procedures, not elsewhere classified, subsequent encounter: Secondary | ICD-10-CM | POA: Diagnosis not present

## 2015-08-28 MED ORDER — SULFAMETHOXAZOLE-TRIMETHOPRIM 800-160 MG PO TABS: 1.0000 | ORAL_TABLET | Freq: Two times a day (BID) | ORAL | Status: DC

## 2015-08-28 NOTE — Progress Notes (Signed)
Patient ID: [name] MRN: [id number], DOB: 1975/11/14, 40 y.o. Date of Encounter: 08/28/2015, 4:17 PM    Chief Complaint:  Chief Complaint  Patient presents with  . 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 year old female presents For above.  I have seen her for this problem in the past. In the past, I have  even spoken with her surgeon's office, they saw her there for this specific problem that day,  but they did not feel like there was any underlying/surigcal problem that they needed to manage. In the past I have treated this with Bactrim and patient states that with at least one of those times, the site seemed to have completely healed for a period of time.  However site keeps reoccurring.  Pt states that she recently went to pain management and they were going to do an injection in her knee but when they realized she has a nonhealing wound said that they cannot give her any steroid injection with nonhealing wound.     Home Meds:   Outpatient Prescriptions Prior to Visit  Medication Sig Dispense Refill  . benztropine (COGENTIN) 2 MG tablet Take 2 mg by mouth.    [name] 15 MCG/HR PTWK APPLY 1 PATCH TOPICALLY ONCE A WEEK  0  . Calcium Carbonate-Vitamin D (CALCIUM + D PO) Take 1 tablet by mouth daily.    . clindamycin (CLINDAGEL) 1 % gel Apply topically 2 (two) times daily. 30 g 1  . clonazePAM (KLONOPIN) 1 MG tablet Take 1 mg by mouth 2 (two) times daily.     . ferrous sulfate 325 (65 FE) MG tablet Take 650 mg by mouth 2 (two) times daily.     . haloperidol (HALDOL) 5 MG tablet 1 tab in AM and 3 tab at HS    . meloxicam (MOBIC) 7.5 MG tablet TAKE 1 TO 2 TABLETS BY MOUTH EVERY DAY AS NEEDED PAIN  1  . Multiple Vitamins-Minerals (HAIR/SKIN/NAILS/BIOTIN) TABS Take 1 tablet by mouth 2 (two) times daily.    Marland Kitchen NUVIGIL 250 MG tablet Take 250 mg by mouth daily.  5  . ondansetron (ZOFRAN ODT) 4 MG  disintegrating tablet Take 1 tablet (4 mg total) by mouth every 8 (eight) hours as needed for nausea or vomiting. 40 tablet 0  . QUEtiapine (SEROQUEL) 300 MG tablet 2 hs    . valACYclovir (VALTREX) 500 MG tablet Take 1 tablet (500 mg total) by mouth daily as needed (fever blister). 30 tablet 3  . zolpidem (AMBIEN) 10 MG tablet Take 10 mg by mouth at bedtime.      No facility-administered medications prior to visit.    Allergies:  Allergies  Allergen Reactions  . Nsaids   . Tylenol [Acetaminophen] Nausea And Vomiting      Review of Systems: See HPI for pertinent ROS. All other ROS negative.    Physical Exam: Blood pressure 136/88, temperature 98.1 F (36.7 C), temperature source Oral, height 5' 4.5" (1.638 m), weight 236 lb (107.049 kg), last menstrual period 08/24/2015., Body mass index is 39.9 kg/(m^2). General:  WF. Appears in no acute distress. Neck: Supple. No thyromegaly. No lymphadenopathy. Lungs: Clear bilaterally to auscultation without wheezes, rales, or rhonchi. Breathing is unlabored. Heart: Regular rhythm. No murmurs, rubs, or gallops. Msk:  Strength and tone normal for age. Skin:  Just superior to and left of umbilicus------there is 2 cm x  2 cm wound. It is shallow--  ~3 mm depth.  There is no drainage. There is no erythema.  Neuro: Alert and oriented X 3. Moves all extremities spontaneously. Gait is normal. CNII-XII grossly in tact. Psych:  Responds to questions appropriately with a normal affect.     ASSESSMENT AND PLAN:  40 y.o. year old female with  1. Non-healing surgical wound, subsequent encounter I will refer her to [facility] to manage this wound. Will start her on Bactrim to be taking in interim. Pt voices understanding and agrees.  - AMB referral to wound care center - sulfamethoxazole-trimethoprim (BACTRIM DS,SEPTRA DS) 800-160 MG tablet; Take 1 tablet by mouth 2 (two) times daily.  Dispense: 20 tablet; Refill: 0   Signed, [address] [place], [place],  [facility] 08/28/2015 4:17 PM

## 2015-09-02 ENCOUNTER — Ambulatory Visit (HOSPITAL_COMMUNITY): Payer: Medicare Other | Attending: Physician Assistant | Admitting: Physical Therapy

## 2015-09-02 DIAGNOSIS — S31105D Unspecified open wound of abdominal wall, periumbilic region without penetration into peritoneal cavity, subsequent encounter: Secondary | ICD-10-CM | POA: Insufficient documentation

## 2015-09-02 DIAGNOSIS — X58XXXD Exposure to other specified factors, subsequent encounter: Secondary | ICD-10-CM | POA: Insufficient documentation

## 2015-09-02 NOTE — Therapy (Signed)
[place], [place], [address] Phone: [phone]   Fax:  [phone]  Physical Therapy Evaluation  Patient Details  Name: [name] MRN: [id number] Date of Birth: [birth year] No Data Recorded  Encounter Date: 09/02/2015      PT End of Session - 09/02/15 1600    Visit Number 1   Number of Visits 6   Date for PT Re-Evaluation 09/30/15   Authorization Type Medicare    Authorization Time Period 09/02/15 to 10/03/15   Authorization - Visit Number 1   Authorization - Number of Visits 10   PT Start Time [id number]   PT Stop Time 1540  very simple, non-complex wound    PT Time Calculation (min) 25 min   Activity Tolerance Patient tolerated treatment well   Behavior During Therapy [facility] for tasks assessed/performed      Past Medical History  Diagnosis Date  . GERD (gastroesophageal reflux disease)   . Diverticulitis 2008    perforated/ requiring resection  . Colostomy in place [facility])   . Bipolar affective ([place])   . Depression   . Anxiety   . Asthma   . Arthritis   . Blood transfusion without reported diagnosis   . Substance abuse     12 years ago-crack cocaine  . Family history of adverse reaction to anesthesia     MGM was placed on vent after surgery  . Sleep apnea     mild, does not use c-pap machine  . Shortness of breath dyspnea   . Headache   . Anemia     Past Surgical History  Procedure Laterality Date  . Exploratory laparotomy with resection  2008  . Colostomy reversal  11/2006  . Abdominal wall hernia  07/2007    open repair with lysis of adhesions  . Hx of abd wall seroma  08/2007    Drained via [place] in [place], [place]  . Hx of abd wall seroma  10/2007    Drained by Dr. Geroge Baseman in office  . Tonsillectomy    . Kidney stones    . Hernia repair    . Examination under anesthesia N/A 05/11/2012    Procedure: EXAM UNDER ANESTHESIA;  Surgeon: Donato Heinz, MD;  Location: [facility];  Service: General;   Laterality: N/A;  . Placement of seton N/A 05/11/2012    Procedure: PLACEMENT OF SETON;  Surgeon: Donato Heinz, MD;  Location: [facility];  Service: General;  Laterality: N/A;  . Treatment fistula anal    . Finger closed reduction Right 08/05/2012    Procedure: CLOSED REDUCTION RIGHT THUMB (FINGER);  Surgeon: Linna Hoff, MD;  Location: [place];  Service: Orthopedics;  Laterality: Right;  . Esophagogastroduodenoscopy  03/13/2010    [id number] esophagus, status post passage of a Maloney dilator/Small hiatal hernia/ Antral erosions, status post biopsy  . Colonoscopy  03/13/2010    [id number] hemorrhoids likely cause of hematochezia, otherwise normal  . Laparoscopic gastric sleeve resection N/A 02/13/2014    Procedure: LAPAROSCOPIC GASTRIC SLEEVE RESECTION LYSIS OF ADHESIONS, UPPER ENDOSCOPY;  Surgeon: [name], MD;  [facility];  Service: General;  Laterality: N/A;  . Root canal 7-16    . Laparoscopy N/A 09/28/2014    Procedure: LAPAROSCOPY DIAGNOSTIC, INCISION AND DRAINAGE WITH LAPAROSCOPIC EXPLORATION OF ABDOMINAL WALL SEROMA with ultrasound;  Surgeon: Greer Pickerel, MD;  [facility];  Service: General;  Laterality: N/A;    There were no vitals filed for this  visit.                     Wound Therapy - 09/02/15 1554    Subjective Patient states that she has extensive history of stomach surgery, including drain tube in her stomach that was replaced 4 times; the current ulcer has been around for 7 months and she reports taht her underwear does rub on this area. SHe is positive for Staph, however notes that she has freely been taking showers without covering the wound. She is on her second round of anti-biotics for the Staph. So far she has just been putting Neosporin and gauze over her wound.    Patient and Family Stated Goals wound to heal    Pain Assessment No/denies pain   Evaluation and Treatment Procedures Explained to Patient/Family Yes   Evaluation  and Treatment Procedures agreed to   Wound Properties Date First Assessed: 09/02/15 Wound Type: Other (Comment) , non-healing wound   Location: Abdomen Location Orientation: Distal;Mid Wound Description (Comments): abdomen just above umbilicus  Present on Admission: Yes   Dressing Type None   Dressing Changed New   Dressing Status None   Site / Wound Assessment Bleeding;Red;Black   % Wound base Red or Granulating 95%   % Wound base Black 5%   Wound Length (cm) 2 cm   Wound Width (cm) 2 cm   Wound Depth (cm) 0.2 cm   Margins Unattached edges (unapproximated)   Drainage Amount Scant   Drainage Description Serous   Treatment Cleansed;Packing (Impregnated strip);Tape changed;Other (Comment)  xeroform, gauze, medipore tape    Wound Therapy - Clinical Statement Patient arrives with chronic ulcer she reports has been present for the past 7 months; she is staph positive and reports she showers with open wound/her clothes rub on this area, and was encouraged to discontinue both of these activities. Wound mainly red granulating tissue with two small dark areas that we will montior, and appears to have good bloodflow as wound was bleeding a bit during examination. Dressed with xeroform and gauze, medipore tape; provided patient with all supplies due to awkward location of wound in the case that dressing should come off. She was instructed to avoid getting dressing wet and not to replace dressing unless it falls off; also educated that PT would like to monitor her wound healing. At this time patient will benefit from skilled PT services to facilitate full wound healing.   Wound Therapy - Functional Problem List non-healing wound    Factors Delaying/Impairing Wound Healing Infection - systemic/local;Other (comment)  (+) staph    Wound Therapy - Frequency Other (comment)  2x/week for 2 weeks, then 1x/week for 2 weeks    Wound Therapy - Current Recommendations PT   Wound Plan xeroform, medipore, gauze; may  adjust dressing if wound does not heal on timeline                  PT Education - 09/02/15 1559    Education provided Yes   Education Details prognosis, POC; avoid getting area wet; avoid having clothing rub on area; only change dressing if it comes off    Person(s) Educated Patient   Methods Explanation   Comprehension Verbalized understanding          PT Short Term Goals - 09/02/15 1601    PT SHORT TERM GOAL #1   Title Wound will decrease in area by at least 50% in order to demonstrate general improvement of condition  Time 2   Period Weeks   Status New   PT SHORT TERM GOAL #2   Title Patient will be able to verbalize correct skin/wound care, including covering open wounds in the shower and avoiding friction over wound area, as well as proper skin hydration, in order to prevent exacerbation/re-occurence of wound    Time 2   Period Weeks   Status New           PT Long Term Goals - 09/02/15 1609    PT LONG TERM GOAL #1   Title Patient will demonstrate full healing of wound in order to demonstrate resolution of condition    Time 4   Period Weeks   Status New             Patient will benefit from skilled therapeutic intervention in order to improve the following deficits and impairments:     Visit Diagnosis: Unspecified open wound of abdominal wall, periumbilic region without penetration into peritoneal cavity, subsequent encounter - Plan: PT plan of care cert/re-cert      G-Codes - [id number]    Functional Assessment Tool Used Baed on wound healing status, presence of slough, wound bed status    Functional Limitation Other PT primary   Other PT Primary Current Status [id number]) At least 40 percent but less than 60 percent impaired, limited or restricted   Other PT Primary Goal Status [id number]) At least 20 percent but less than 40 percent impaired, limited or restricted       Problem List Patient Active Problem List   Diagnosis Date Noted  .  Degeneration of lumbar or lumbosacral intervertebral disc 12/13/2014  . Anemia associated with nutritional deficiency 11/12/2014  . Bipolar affective disorder ([place]) 09/28/2014  . Anxiety state 09/28/2014  . S/P laparoscopic sleeve gastrectomy 01/2014 09/28/2014  . Abdominal wall seroma - chronic   . Seroma   . Cholelithiases 04/25/2013  . Cough 09/19/2011  . Smoker 09/19/2011  . GERD 03/04/2010  . RECTAL BLEEDING 03/04/2010  . DYSPHAGIA UNSPECIFIED 03/04/2010  . DYSPHAGIA 03/04/2010  . RUQ PAIN 03/04/2010    Deniece Ree PT, DPT [place] [address] [place], [place], [address] Phone: [phone]   Fax:  [phone]  Name: CHRISTIENNE AFFELDT MRN: [id number] Date of Birth: [birth year]

## 2015-09-10 ENCOUNTER — Ambulatory Visit (HOSPITAL_COMMUNITY): Payer: Medicare Other | Admitting: Physical Therapy

## 2015-09-10 DIAGNOSIS — S31105D Unspecified open wound of abdominal wall, periumbilic region without penetration into peritoneal cavity, subsequent encounter: Secondary | ICD-10-CM | POA: Diagnosis not present

## 2015-09-10 NOTE — Therapy (Signed)
[place], [place], [address] Phone: [phone]   Fax:  [phone]  Wound Care Therapy  Patient Details  Name: [name] MRN: [id number] Date of Birth: Sep 02, 1975 No Data Recorded  Encounter Date: 09/10/2015      PT End of Session - 09/10/15 1629    Visit Number 2   Number of Visits 6   Date for PT Re-Evaluation 09/30/15   Authorization Type Medicare    Authorization Time Period 09/02/15 to 10/03/15   Authorization - Visit Number 2   Authorization - Number of Visits 10   PT Start Time 1600   PT Stop Time 1620   PT Time Calculation (min) 20 min   Activity Tolerance Patient tolerated treatment well   Behavior During Therapy [facility] for tasks assessed/performed      Past Medical History  Diagnosis Date  . GERD (gastroesophageal reflux disease)   . Diverticulitis 2008    perforated/ requiring resection  . Colostomy in place [facility])   . Bipolar affective ([place])   . Depression   . Anxiety   . Asthma   . Arthritis   . Blood transfusion without reported diagnosis   . Substance abuse     12 years ago-crack cocaine  . Family history of adverse reaction to anesthesia     MGM was placed on vent after surgery  . Sleep apnea     mild, does not use c-pap machine  . Shortness of breath dyspnea   . Headache   . Anemia     Past Surgical History  Procedure Laterality Date  . Exploratory laparotomy with resection  2008  . Colostomy reversal  11/2006  . Abdominal wall hernia  07/2007    open repair with lysis of adhesions  . Hx of abd wall seroma  08/2007    Drained via [place] in [place], [place]  . Hx of abd wall seroma  10/2007    Drained by Dr. Geroge Baseman in office  . Tonsillectomy    . Kidney stones    . Hernia repair    . Examination under anesthesia N/A 05/11/2012    Procedure: EXAM UNDER ANESTHESIA;  Surgeon: Donato Heinz, MD;  [facility];  Service: General;  Laterality: N/A;  . Placement of seton N/A  05/11/2012    Procedure: PLACEMENT OF SETON;  Surgeon: Donato Heinz, MD;  [facility];  Service: General;  Laterality: N/A;  . Treatment fistula anal    . Finger closed reduction Right 08/05/2012    Procedure: CLOSED REDUCTION RIGHT THUMB (FINGER);  Surgeon: Linna Hoff, MD;  Location: [place];  Service: Orthopedics;  Laterality: Right;  . Esophagogastroduodenoscopy  03/13/2010    [id number] esophagus, status post passage of a Maloney dilator/Small hiatal hernia/ Antral erosions, status post biopsy  . Colonoscopy  03/13/2010    [id number] hemorrhoids likely cause of hematochezia, otherwise normal  . Laparoscopic gastric sleeve resection N/A 02/13/2014    Procedure: LAPAROSCOPIC GASTRIC SLEEVE RESECTION LYSIS OF ADHESIONS, UPPER ENDOSCOPY;  Surgeon: [name], MD;  [facility];  Service: General;  Laterality: N/A;  . Root canal 7-16    . Laparoscopy N/A 09/28/2014    Procedure: LAPAROSCOPY DIAGNOSTIC, INCISION AND DRAINAGE WITH LAPAROSCOPIC EXPLORATION OF ABDOMINAL WALL SEROMA with ultrasound;  Surgeon: Greer Pickerel, MD;  [facility];  Service: General;  Laterality: N/A;    There were no vitals filed for this visit.  Wound Therapy - 09/10/15 1623    Subjective Pt states she has been changing it at home.  States she is no longer wearing her girdles that seemed to be irritating it.  No pain.   Patient and Family Stated Goals wound to heal    Pain Assessment No/denies pain   Wound Properties Date First Assessed: 09/02/15 Wound Type: Other (Comment) , non-healing wound   Location: Abdomen Location Orientation: Distal;Mid Wound Description (Comments): abdomen just above umbilicus  Present on Admission: Yes   Dressing Type Impregnated gauze (bismuth);Tape dressing   Dressing Changed Changed   Dressing Status Intact;Old drainage   Dressing Change Frequency PRN   Site / Wound Assessment Pink;Red   % Wound base Red or Granulating 95%   %  Wound base Yellow 5%   % Wound base Black 0%   Margins Attached edges (approximated)   Drainage Amount Scant   Drainage Description Serous   Treatment Cleansed;Debridement (Selective)   Selective Debridement - Location wound bed   Selective Debridement - Tools Used Scalpel   Selective Debridement - Tissue Removed slough and edges of wound   Wound Therapy - Clinical Statement wound much more moist with noted slough around borders and in central area of wound.  slough easily removed with scapel.  Cleansed wound well and redressed with xeroform, 2X2 and medipore tape dressing.  PT appears to be smaller in size than observed at last visit.  Pt is wanting to leave for the beach the first week in August.  Asked patient come 2X week until then so therapist can debride the edges more to promote approximation.     Wound Therapy - Functional Problem List non-healing wound    Factors Delaying/Impairing Wound Healing Infection - systemic/local;Other (comment)  (+) staph    Wound Therapy - Frequency Other (comment)  2x/week for 2 weeks, then 1x/week for 2 weeks    Wound Therapy - Current Recommendations PT   Wound Plan xeroform, medipore, gauze; may adjust dressing if wound does not heal on timeline                    PT Short Term Goals - 09/02/15 1601    PT SHORT TERM GOAL #1   Title Wound will decrease in area by at least 50% in order to demonstrate general improvement of condition    Time 2   Period Weeks   Status New   PT SHORT TERM GOAL #2   Title Patient will be able to verbalize correct skin/wound care, including covering open wounds in the shower and avoiding friction over wound area, as well as proper skin hydration, in order to prevent exacerbation/re-occurence of wound    Time 2   Period Weeks   Status New           PT Long Term Goals - 09/02/15 1609    PT LONG TERM GOAL #1   Title Patient will demonstrate full healing of wound in order to demonstrate resolution of  condition    Time 4   Period Weeks   Status New             Patient will benefit from skilled therapeutic intervention in order to improve the following deficits and impairments:     Visit Diagnosis: Unspecified open wound of abdominal wall, periumbilic region without penetration into peritoneal cavity, subsequent encounter     Problem List Patient Active Problem List   Diagnosis Date Noted  . Degeneration of lumbar or  lumbosacral intervertebral disc 12/13/2014  . Anemia associated with nutritional deficiency 11/12/2014  . Bipolar affective disorder ([place]) 09/28/2014  . Anxiety state 09/28/2014  . S/P laparoscopic sleeve gastrectomy 01/2014 09/28/2014  . Abdominal wall seroma - chronic   . Seroma   . Cholelithiases 04/25/2013  . Cough 09/19/2011  . Smoker 09/19/2011  . GERD 03/04/2010  . RECTAL BLEEDING 03/04/2010  . DYSPHAGIA UNSPECIFIED 03/04/2010  . DYSPHAGIA 03/04/2010  . RUQ PAIN 03/04/2010    Teena Irani, PTA/CLT [phone]  09/10/2015, 4:31 PM  [facility] [address] [place], [place], [address] Phone: [phone]   Fax:  [phone]  Name: [name] MRN: [id number] Date of Birth: [birth year]

## 2015-09-12 ENCOUNTER — Ambulatory Visit (HOSPITAL_COMMUNITY): Payer: Medicare Other | Admitting: Physical Therapy

## 2015-09-12 ENCOUNTER — Telehealth (HOSPITAL_COMMUNITY): Payer: Self-pay

## 2015-09-12 ENCOUNTER — Telehealth (HOSPITAL_COMMUNITY): Payer: Self-pay | Admitting: Physical Therapy

## 2015-09-12 NOTE — Telephone Encounter (Signed)
09/12/15 pt called to say she missed her appt.

## 2015-09-12 NOTE — Telephone Encounter (Signed)
Pt did not show for appointment.  Called and left message to call clinic to reschedule as she is a wound patient.  Teena Irani, PTA/CLT [phone]

## 2015-09-16 ENCOUNTER — Ambulatory Visit (HOSPITAL_COMMUNITY): Payer: Medicare Other | Admitting: Physical Therapy

## 2015-09-16 ENCOUNTER — Ambulatory Visit: Payer: Medicare Other | Admitting: Dietician

## 2015-09-17 ENCOUNTER — Encounter: Payer: Medicare Other | Attending: Internal Medicine | Admitting: Dietician

## 2015-09-17 ENCOUNTER — Encounter: Payer: Self-pay | Admitting: Dietician

## 2015-09-17 DIAGNOSIS — K219 Gastro-esophageal reflux disease without esophagitis: Secondary | ICD-10-CM | POA: Insufficient documentation

## 2015-09-17 DIAGNOSIS — Z9884 Bariatric surgery status: Secondary | ICD-10-CM | POA: Diagnosis not present

## 2015-09-17 DIAGNOSIS — E669 Obesity, unspecified: Secondary | ICD-10-CM | POA: Insufficient documentation

## 2015-09-17 DIAGNOSIS — Z6832 Body mass index (BMI) 32.0-32.9, adult: Secondary | ICD-10-CM | POA: Diagnosis not present

## 2015-09-17 DIAGNOSIS — D509 Iron deficiency anemia, unspecified: Secondary | ICD-10-CM | POA: Diagnosis not present

## 2015-09-17 DIAGNOSIS — I10 Essential (primary) hypertension: Secondary | ICD-10-CM | POA: Insufficient documentation

## 2015-09-17 DIAGNOSIS — Z48815 Encounter for surgical aftercare following surgery on the digestive system: Secondary | ICD-10-CM | POA: Diagnosis not present

## 2015-09-17 DIAGNOSIS — Z713 Dietary counseling and surveillance: Secondary | ICD-10-CM | POA: Diagnosis not present

## 2015-09-17 NOTE — Progress Notes (Signed)
  Follow-up visit:  1.5 years Post-Operative Gastric Sleeve Surgery  Medical Nutrition Therapy:  Appt start time: [id number] end time: 1120  Primary concerns today: Post-operative Bariatric Surgery Nutrition Management. Abigail returns having gained 5 pounds. She reports she wants to get weight under control. Craving a lot of sweets. Feeling like foods are getting stuck sometimes especially when she eats too fast. Feeling much more positive now that her son is back home from staying with his dad this summer.   Surgery date: 02/13/2014 Surgery type: Sleeve Start weight at [facility]: 314 lbs on 06/30/2013 Weight today: 239.8 lbs Weight change: 5.2 lbs gained Total weight lost: 74 lbs   Preferred Learning Style:   No preference indicated   Learning Readiness:   Ready  24-hr recall: B (7AM): Special K with raisins and 1% milk and Splenda L: 1 hot dog with bun  S: sugar free fudge bars, no sugar added ice cream  D: none Snk:  Fluid intake: 64 ounces (Coke, [employer], diet [place], juice, chocolate milk, water "at night with medicines"); 1% milk Estimated total protein intake: difficult to assess  Medications: see list Supplementation: taking "sometimes"  Drinking while eating: no Hair loss: yes, severe Carbonated beverages: diet sodas  Recent physical activity:  none  Progress Towards Goal(s):  Some progress.   Nutritional Diagnosis:  [place]-3.3 Overweight/obesity related to past poor dietary habits and physical inactivity as evidenced by patient w/ recent gastric sleeve surgery following dietary guidelines for continued weight loss.  Intervention:  Nutrition counseling provided. -Keep higher protein options in the house for you and [place] a protein food with each meal and snack and eat these foods first  -Eggs, [employer], [place] sausage, cheese, deli meat, yogurt -Avoid buying your trigger foods -Stay away from sweet drinks -Keep 40-calorie Fudgsicles on  hand  Teaching Method Utilized:  Visual Auditory Hands on  Barriers to learning/adherence to lifestyle change: emotional eating  Demonstrated degree of understanding via:  Teach Back   Monitoring/Evaluation:  Dietary intake, exercise, and body weight. Follow up in 6 weeks.

## 2015-09-17 NOTE — Patient Instructions (Addendum)
-  Keep higher protein options in the house for you and [place] a protein food with each meal and snack and eat these foods first  -Eggs, [employer], [place] sausage, cheese, deli meat, yogurt  -Avoid buying your trigger foods -Stay away from sweet drinks  -Keep 40-calorie Fudgsicles on hand

## 2015-09-18 ENCOUNTER — Ambulatory Visit (HOSPITAL_COMMUNITY): Payer: Medicare Other

## 2015-09-18 ENCOUNTER — Encounter: Payer: Self-pay | Admitting: Dietician

## 2015-09-18 DIAGNOSIS — S31105D Unspecified open wound of abdominal wall, periumbilic region without penetration into peritoneal cavity, subsequent encounter: Secondary | ICD-10-CM | POA: Diagnosis not present

## 2015-09-18 NOTE — Therapy (Signed)
[place], [place], [address] Phone: [phone]   Fax:  [phone]  Wound Care Therapy  Patient Details  Name: [name] MRN: [id number] Date of Birth: May 28, 1975 No Data Recorded  Encounter Date: 09/18/2015      PT End of Session - 09/18/15 1719    Visit Number 3   Number of Visits 6   Date for PT Re-Evaluation 09/30/15   Authorization Type Medicare    Authorization Time Period 09/02/15 to 10/03/15   Authorization - Visit Number 3   Authorization - Number of Visits 10   PT Start Time [id number]   PT Stop Time 1715   PT Time Calculation (min) 25 min   Activity Tolerance Patient tolerated treatment well   Behavior During Therapy [facility] for tasks assessed/performed      Past Medical History:  Diagnosis Date  . Anemia   . Anxiety   . Arthritis   . Asthma   . Bipolar affective ([place])   . Blood transfusion without reported diagnosis   . Colostomy in place [facility])   . Depression   . Diverticulitis 2008   perforated/ requiring resection  . Family history of adverse reaction to anesthesia    MGM was placed on vent after surgery  . GERD (gastroesophageal reflux disease)   . Headache   . Shortness of breath dyspnea   . Sleep apnea    mild, does not use c-pap machine  . Substance abuse    12 years ago-crack cocaine    Past Surgical History:  Procedure Laterality Date  . Abdominal wall hernia  07/2007   open repair with lysis of adhesions  . COLONOSCOPY  03/13/2010   [id number] hemorrhoids likely cause of hematochezia, otherwise normal  . Colostomy reversal  11/2006  . ESOPHAGOGASTRODUODENOSCOPY  03/13/2010   [id number] esophagus, status post passage of a Maloney dilator/Small hiatal hernia/ Antral erosions, status post biopsy  . EXAMINATION UNDER ANESTHESIA N/A 05/11/2012   Procedure: EXAM UNDER ANESTHESIA;  Surgeon: Donato Heinz, MD;  [facility];  Service: General;  Laterality: N/A;   . Exploratory laparotomy with resection  2008  . FINGER CLOSED REDUCTION Right 08/05/2012   Procedure: CLOSED REDUCTION RIGHT THUMB (FINGER);  Surgeon: Linna Hoff, MD;  Location: [place];  Service: Orthopedics;  Laterality: Right;  . HERNIA REPAIR    . Hx of abd wall seroma  08/2007   Drained via [place] in [place], [place]  . Hx of abd wall seroma  10/2007   Drained by Dr. Geroge Baseman in office  . Kidney stones    . LAPAROSCOPIC GASTRIC SLEEVE RESECTION N/A 02/13/2014   Procedure: LAPAROSCOPIC GASTRIC SLEEVE RESECTION LYSIS OF ADHESIONS, UPPER ENDOSCOPY;  Surgeon: [name], MD;  [facility];  Service: General;  Laterality: N/A;  . LAPAROSCOPY N/A 09/28/2014   Procedure: LAPAROSCOPY DIAGNOSTIC, INCISION AND DRAINAGE WITH LAPAROSCOPIC EXPLORATION OF ABDOMINAL WALL SEROMA with ultrasound;  Surgeon: Greer Pickerel, MD;  [facility];  Service: General;  Laterality: N/A;  . PLACEMENT OF SETON N/A 05/11/2012   Procedure: PLACEMENT OF SETON;  Surgeon: Donato Heinz, MD;  [facility];  Service: General;  Laterality: N/A;  . root canal 7-16    . TONSILLECTOMY    . TREATMENT FISTULA ANAL      There were no vitals filed for this visit.       Subjective Assessment - 09/18/15 1715    Subjective No reports of pain,  dressings intact.                   Wound Therapy - 09/18/15 1716    Subjective No reports of pain, dressings intact.   Patient and Family Stated Goals wound to heal    Pain Assessment No/denies pain   Evaluation and Treatment Procedures Explained to Patient/Family Yes   Evaluation and Treatment Procedures agreed to   Wound Properties Date First Assessed: 09/02/15 Wound Type: Other (Comment) , non-healing wound   Location: Abdomen Location Orientation: Distal;Mid Wound Description (Comments): abdomen just above umbilicus  Present on Admission: Yes   Dressing Type Impregnated gauze (bismuth);Tape dressing  xeroform and medipore tape   Dressing Changed Changed    Dressing Status Intact;Old drainage   Dressing Change Frequency PRN   Site / Wound Assessment Pink;Red   % Wound base Red or Granulating 100%   % Wound base Yellow 0%   % Wound base Black 0%   Margins Attached edges (approximated)   Drainage Amount Scant   Drainage Description Serous   Treatment Cleansed;Debridement (Selective)   Selective Debridement - Location wound bed   Selective Debridement - Tools Used Scalpel   Selective Debridement - Tissue Removed slough and edges of wound   Wound Therapy - Clinical Statement Wound progressing well with no slough remaining following debridement.  No signs of infection noted.  Did instruct pt scar tissue massage perimeter of wound for confort and pain control.  continued with xeroform and medipore tape.  No reports of pain through session.    Wound Therapy - Functional Problem List non-healing wound    Factors Delaying/Impairing Wound Healing Infection - systemic/local;Other (comment)   Wound Therapy - Frequency Other (comment)  2x/week for 2 weeks then 1x/week for 2 weeks   Wound Therapy - Current Recommendations PT   Wound Plan xeroform, medipore, gauze; may adjust dressing if wound does not heal on timeline                    PT Short Term Goals - 09/02/15 1601      PT SHORT TERM GOAL #1   Title Wound will decrease in area by at least 50% in order to demonstrate general improvement of condition    Time 2   Period Weeks   Status New     PT SHORT TERM GOAL #2   Title Patient will be able to verbalize correct skin/wound care, including covering open wounds in the shower and avoiding friction over wound area, as well as proper skin hydration, in order to prevent exacerbation/re-occurence of wound    Time 2   Period Weeks   Status New           PT Long Term Goals - 09/02/15 1609      PT LONG TERM GOAL #1   Title Patient will demonstrate full healing of wound in order to demonstrate resolution of condition    Time 4    Period Weeks   Status New             Patient will benefit from skilled therapeutic intervention in order to improve the following deficits and impairments:     Visit Diagnosis: Unspecified open wound of abdominal wall, periumbilic region without penetration into peritoneal cavity, subsequent encounter     Problem List Patient Active Problem List   Diagnosis Date Noted  . Degeneration of lumbar or lumbosacral intervertebral disc 12/13/2014  . Anemia associated with nutritional deficiency 11/12/2014  .  Bipolar affective disorder ([place]) 09/28/2014  . Anxiety state 09/28/2014  . S/P laparoscopic sleeve gastrectomy 01/2014 09/28/2014  . Abdominal wall seroma - chronic   . Seroma   . Cholelithiases 04/25/2013  . Cough 09/19/2011  . Smoker 09/19/2011  . GERD 03/04/2010  . RECTAL BLEEDING 03/04/2010  . DYSPHAGIA UNSPECIFIED 03/04/2010  . DYSPHAGIA 03/04/2010  . RUQ PAIN 03/04/2010   Ihor Austin, [place]; [place]  Aldona Lento 09/18/2015, 5:20 PM  [place], [place], [address] Phone: [phone]   Fax:  [phone]  Name: [name] MRN: [id number] Date of Birth: 09/25/1975

## 2015-09-19 DIAGNOSIS — F319 Bipolar disorder, unspecified: Secondary | ICD-10-CM | POA: Diagnosis not present

## 2015-09-20 ENCOUNTER — Ambulatory Visit (HOSPITAL_COMMUNITY): Payer: Medicare Other

## 2015-09-20 DIAGNOSIS — S31105D Unspecified open wound of abdominal wall, periumbilic region without penetration into peritoneal cavity, subsequent encounter: Secondary | ICD-10-CM

## 2015-09-20 NOTE — Therapy (Signed)
[place], [place], [address] Phone: [phone]   Fax:  [phone]  Wound Care Therapy  Patient Details  Name: [name] MRN: [id number] Date of Birth: 01/17/1976 No Data Recorded  Encounter Date: 09/20/2015      PT End of Session - 09/20/15 1649    Visit Number 4   Number of Visits 6   Date for PT Re-Evaluation 09/30/15   Authorization Type Medicare    Authorization Time Period 09/02/15 to 10/03/15   Authorization - Visit Number 4   Authorization - Number of Visits 10   PT Start Time [id number]   PT Stop Time 1620   PT Time Calculation (min) 15 min      Past Medical History:  Diagnosis Date  . Anemia   . Anxiety   . Arthritis   . Asthma   . Bipolar affective ([place])   . Blood transfusion without reported diagnosis   . Colostomy in place [facility])   . Depression   . Diverticulitis 2008   perforated/ requiring resection  . Family history of adverse reaction to anesthesia    MGM was placed on vent after surgery  . GERD (gastroesophageal reflux disease)   . Headache   . Shortness of breath dyspnea   . Sleep apnea    mild, does not use c-pap machine  . Substance abuse    12 years ago-crack cocaine    Past Surgical History:  Procedure Laterality Date  . Abdominal wall hernia  07/2007   open repair with lysis of adhesions  . COLONOSCOPY  03/13/2010   [id number] hemorrhoids likely cause of hematochezia, otherwise normal  . Colostomy reversal  11/2006  . ESOPHAGOGASTRODUODENOSCOPY  03/13/2010   [id number] esophagus, status post passage of a Maloney dilator/Small hiatal hernia/ Antral erosions, status post biopsy  . EXAMINATION UNDER ANESTHESIA N/A 05/11/2012   Procedure: EXAM UNDER ANESTHESIA;  Surgeon: Donato Heinz, MD;  [facility];  Service: General;  Laterality: N/A;  . Exploratory laparotomy with resection  2008  . FINGER CLOSED REDUCTION Right 08/05/2012   Procedure: CLOSED  REDUCTION RIGHT THUMB (FINGER);  Surgeon: Linna Hoff, MD;  Location: [place];  Service: Orthopedics;  Laterality: Right;  . HERNIA REPAIR    . Hx of abd wall seroma  08/2007   Drained via [place] in [place], [place]  . Hx of abd wall seroma  10/2007   Drained by Dr. Geroge Baseman in office  . Kidney stones    . LAPAROSCOPIC GASTRIC SLEEVE RESECTION N/A 02/13/2014   Procedure: LAPAROSCOPIC GASTRIC SLEEVE RESECTION LYSIS OF ADHESIONS, UPPER ENDOSCOPY;  Surgeon: [name], MD;  [facility];  Service: General;  Laterality: N/A;  . LAPAROSCOPY N/A 09/28/2014   Procedure: LAPAROSCOPY DIAGNOSTIC, INCISION AND DRAINAGE WITH LAPAROSCOPIC EXPLORATION OF ABDOMINAL WALL SEROMA with ultrasound;  Surgeon: Greer Pickerel, MD;  [facility];  Service: General;  Laterality: N/A;  . PLACEMENT OF SETON N/A 05/11/2012   Procedure: PLACEMENT OF SETON;  Surgeon: Donato Heinz, MD;  [facility];  Service: General;  Laterality: N/A;  . root canal 7-16    . TONSILLECTOMY    . TREATMENT FISTULA ANAL      There were no vitals filed for this visit.       Subjective Assessment - 09/20/15 1642    Subjective No reports of pain in wound, dressing intact.  Pt does c/o Lt ankle swollen with increased pain standing.  Currently in Pain? Yes   Pain Score 7    Pain Location Ankle   Pain Orientation Left   Pain Descriptors / Indicators --  swollen   Aggravating Factors  standing and gait   Pain Relieving Factors rest                   Wound Therapy - 09/20/15 1642    Subjective No reports of pain in wound, dressing intact.  Pt does c/o Lt ankle swollen with increased pain standing.   Patient and Family Stated Goals wound to heal    Date of Onset --  7 months ago   Pain Assessment 0-10   Evaluation and Treatment Procedures Explained to Patient/Family Yes   Evaluation and Treatment Procedures agreed to   Wound Properties Date First Assessed: 09/02/15 Wound Type: Other (Comment) , non-healing wound    Location: Abdomen Location Orientation: Distal;Mid Wound Description (Comments): abdomen just above umbilicus  Present on Admission: Yes   Dressing Type Impregnated gauze (bismuth);Tape dressing  xeroform and medipore tape   Dressing Changed Changed   Dressing Status Intact;Old drainage   Dressing Change Frequency PRN   Site / Wound Assessment Pink;Red   % Wound base Red or Granulating 100%   % Wound base Yellow 0%   % Wound base Black 0%   Wound Length (cm) 0.5 cm   Wound Width (cm) 0.6 cm   Wound Depth (cm) 0 cm   Tunneling (cm) none.   Undermining (cm) none   Margins Attached edges (approximated)   Drainage Amount Scant   Drainage Description Serous   Treatment Cleansed;Debridement (Selective)   Selective Debridement - Location wound bed   Selective Debridement - Tools Used Forceps   Selective Debridement - Tissue Removed slough and edges of wound   Wound Therapy - Clinical Statement Wound pressing well with no slough/biofilm remaining following minimal debridement.  Contiinued cleansing, debridement for removal of slough/biofilm and dressed with xeroform and medipore tape.  Pt plans to go to beach next week, reviewed appropriate self care with pt.'s ability to verbalize appropriate technique.  Measurements taken with vase improvements with reduction in overall size.  No reports of pain through session to wound.  Anticipate DC soon upon return from beach due to great progress.     Wound Therapy - Functional Problem List non-healing wound    Factors Delaying/Impairing Wound Healing Infection - systemic/local;Other (comment)   Wound Therapy - Frequency --  2x/2 weeks then 1x/2 weeks   Wound Therapy - Current Recommendations PT   Wound Plan xeroform, medipore, gauze; may adjust dressing if wound does not heal on timeline    Dressing  xeroform and medipore tape                   PT Short Term Goals - 09/02/15 1601      PT SHORT TERM GOAL #1   Title Wound will decrease in  area by at least 50% in order to demonstrate general improvement of condition    Time 2   Period Weeks   Status New     PT SHORT TERM GOAL #2   Title Patient will be able to verbalize correct skin/wound care, including covering open wounds in the shower and avoiding friction over wound area, as well as proper skin hydration, in order to prevent exacerbation/re-occurence of wound    Time 2   Period Weeks   Status New  PT Long Term Goals - 09/02/15 1609      PT LONG TERM GOAL #1   Title Patient will demonstrate full healing of wound in order to demonstrate resolution of condition    Time 4   Period Weeks   Status New             Patient will benefit from skilled therapeutic intervention in order to improve the following deficits and impairments:     Visit Diagnosis: Unspecified open wound of abdominal wall, periumbilic region without penetration into peritoneal cavity, subsequent encounter     Problem List Patient Active Problem List   Diagnosis Date Noted  . Degeneration of lumbar or lumbosacral intervertebral disc 12/13/2014  . Anemia associated with nutritional deficiency 11/12/2014  . Bipolar affective disorder ([place]) 09/28/2014  . Anxiety state 09/28/2014  . S/P laparoscopic sleeve gastrectomy 01/2014 09/28/2014  . Abdominal wall seroma - chronic   . Seroma   . Cholelithiases 04/25/2013  . Cough 09/19/2011  . Smoker 09/19/2011  . GERD 03/04/2010  . RECTAL BLEEDING 03/04/2010  . DYSPHAGIA UNSPECIFIED 03/04/2010  . DYSPHAGIA 03/04/2010  . RUQ PAIN 03/04/2010   Ihor Austin, [place]; [place]  Aldona Lento 09/20/2015, 4:51 PM  [place] [address] [place], [place], [address] Phone: [phone]   Fax:  [phone]  Name: [name] MRN: [id number] Date of Birth: 1975-06-19

## 2015-09-23 ENCOUNTER — Ambulatory Visit (HOSPITAL_COMMUNITY): Payer: Medicare Other | Admitting: Physical Therapy

## 2015-09-25 ENCOUNTER — Ambulatory Visit (HOSPITAL_COMMUNITY): Payer: Medicare Other | Admitting: Physical Therapy

## 2015-09-27 ENCOUNTER — Ambulatory Visit (HOSPITAL_COMMUNITY): Payer: Medicare Other

## 2015-09-30 ENCOUNTER — Ambulatory Visit (HOSPITAL_COMMUNITY): Payer: Medicare Other | Admitting: Physical Therapy

## 2015-09-30 DIAGNOSIS — M5417 Radiculopathy, lumbosacral region: Secondary | ICD-10-CM | POA: Diagnosis not present

## 2015-09-30 DIAGNOSIS — G894 Chronic pain syndrome: Secondary | ICD-10-CM | POA: Diagnosis not present

## 2015-09-30 DIAGNOSIS — M170 Bilateral primary osteoarthritis of knee: Secondary | ICD-10-CM | POA: Diagnosis not present

## 2015-09-30 DIAGNOSIS — Z79891 Long term (current) use of opiate analgesic: Secondary | ICD-10-CM | POA: Diagnosis not present

## 2015-09-30 DIAGNOSIS — M47817 Spondylosis without myelopathy or radiculopathy, lumbosacral region: Secondary | ICD-10-CM | POA: Diagnosis not present

## 2015-10-02 ENCOUNTER — Ambulatory Visit (HOSPITAL_COMMUNITY): Payer: Medicare Other | Attending: Physician Assistant | Admitting: Physical Therapy

## 2015-10-02 DIAGNOSIS — X58XXXD Exposure to other specified factors, subsequent encounter: Secondary | ICD-10-CM | POA: Diagnosis not present

## 2015-10-02 DIAGNOSIS — S31105D Unspecified open wound of abdominal wall, periumbilic region without penetration into peritoneal cavity, subsequent encounter: Secondary | ICD-10-CM | POA: Insufficient documentation

## 2015-10-02 NOTE — Therapy (Signed)
[place], [place], [address] Phone: [phone]   Fax:  [phone]  Wound Care Therapy  Patient Details  Name: [name] MRN: [id number] Date of Birth: 05/01/1975 No Data Recorded  Encounter Date: 10/02/2015      PT End of Session - 10/02/15 1333    Visit Number 5   Number of Visits 5   Date for PT Re-Evaluation 09/30/15   Authorization Type Medicare    Authorization Time Period 09/02/15 to 10/03/15   Authorization - Visit Number 5   Authorization - Number of Visits 5   PT Start Time 1300   PT Stop Time 1318   PT Time Calculation (min) 18 min   Activity Tolerance Patient tolerated treatment well   Behavior During Therapy [facility] for tasks assessed/performed      Past Medical History:  Diagnosis Date  . Anemia   . Anxiety   . Arthritis   . Asthma   . Bipolar affective ([place])   . Blood transfusion without reported diagnosis   . Colostomy in place [facility])   . Depression   . Diverticulitis 2008   perforated/ requiring resection  . Family history of adverse reaction to anesthesia    MGM was placed on vent after surgery  . GERD (gastroesophageal reflux disease)   . Headache   . Shortness of breath dyspnea   . Sleep apnea    mild, does not use c-pap machine  . Substance abuse    12 years ago-crack cocaine    Past Surgical History:  Procedure Laterality Date  . Abdominal wall hernia  07/2007   open repair with lysis of adhesions  . COLONOSCOPY  03/13/2010   [id number] hemorrhoids likely cause of hematochezia, otherwise normal  . Colostomy reversal  11/2006  . ESOPHAGOGASTRODUODENOSCOPY  03/13/2010   [id number] esophagus, status post passage of a Maloney dilator/Small hiatal hernia/ Antral erosions, status post biopsy  . EXAMINATION UNDER ANESTHESIA N/A 05/11/2012   Procedure: EXAM UNDER ANESTHESIA;  Surgeon: Donato Heinz, MD;  [facility];  Service: General;  Laterality: N/A;  .  Exploratory laparotomy with resection  2008  . FINGER CLOSED REDUCTION Right 08/05/2012   Procedure: CLOSED REDUCTION RIGHT THUMB (FINGER);  Surgeon: Linna Hoff, MD;  Location: [place];  Service: Orthopedics;  Laterality: Right;  . HERNIA REPAIR    . Hx of abd wall seroma  08/2007   Drained via [place] in [place], [place]  . Hx of abd wall seroma  10/2007   Drained by Dr. Geroge Baseman in office  . Kidney stones    . LAPAROSCOPIC GASTRIC SLEEVE RESECTION N/A 02/13/2014   Procedure: LAPAROSCOPIC GASTRIC SLEEVE RESECTION LYSIS OF ADHESIONS, UPPER ENDOSCOPY;  Surgeon: [name], MD;  [facility];  Service: General;  Laterality: N/A;  . LAPAROSCOPY N/A 09/28/2014   Procedure: LAPAROSCOPY DIAGNOSTIC, INCISION AND DRAINAGE WITH LAPAROSCOPIC EXPLORATION OF ABDOMINAL WALL SEROMA with ultrasound;  Surgeon: Greer Pickerel, MD;  [facility];  Service: General;  Laterality: N/A;  . PLACEMENT OF SETON N/A 05/11/2012   Procedure: PLACEMENT OF SETON;  Surgeon: Donato Heinz, MD;  [facility];  Service: General;  Laterality: N/A;  . root canal 7-16    . TONSILLECTOMY    . TREATMENT FISTULA ANAL      There were no vitals filed for this visit.                  Wound Therapy -  October 15, 2015 1326    Subjective Pt has returned from vacation.  Has no complaints    Patient and Family Stated Goals wound to heal    Pain Assessment No/denies pain   Pain Score 0-No pain   Evaluation and Treatment Procedures Explained to Patient/Family Yes   Evaluation and Treatment Procedures agreed to   Wound Properties Date First Assessed: 09/02/15 Wound Type: Other (Comment) , non-healing wound   Location: Abdomen Location Orientation: Distal;Mid Wound Description (Comments): abdomen just above umbilicus  Present on Admission: Yes   Dressing Type Impregnated gauze (bismuth)   Dressing Changed Changed   Dressing Status Old drainage   Dressing Change Frequency PRN   Site / Wound Assessment Pink   % Wound base Red  or Granulating 100%   Wound Length (cm) 1.5 cm  was 2.0   Wound Width (cm) 0.6 cm  was 2.0   Wound Depth (cm) 0 cm  was .2   Tunneling (cm) none   Undermining (cm) none   Drainage Amount Scant   Drainage Description Serous   Wound Therapy - Clinical Statement Wound needs no debridement.  Able to take biofilm off by cleansing with soap, water and washcloth.  Pt wound is not in need of skilled care.  Pt voices that she has been caring for wound while on vacation.  Therapist urged pt to rub open area with washcloth as she showers , cover wound with xeroform and bandaid.  Pt to be discharged from skilled pt .   Wound Therapy - Current Recommendations --  discharge as wound does not warrent skilled care.    Wound Plan self care.    Dressing  xeroform, 2x2 and medipore tape.                  PT Education - 2015/10/15 1333    Education provided Yes   Education Details self care for wound at home    Person(s) Educated Patient   Methods Explanation   Comprehension Verbalized understanding          PT Short Term Goals - 10-15-15 1333      PT SHORT TERM GOAL #1   Title Wound will decrease in area by at least 50% in order to demonstrate general improvement of condition    Time 2   Period Weeks   Status Achieved     PT SHORT TERM GOAL #2   Title Patient will be able to verbalize correct skin/wound care, including covering open wounds in the shower and avoiding friction over wound area, as well as proper skin hydration, in order to prevent exacerbation/re-occurence of wound    Time 2   Period Weeks   Status Achieved           PT Long Term Goals - 2015-10-15 1334      PT LONG TERM GOAL #1   Title Patient will demonstrate full healing of wound in order to demonstrate resolution of condition    Time 4   Period Weeks   Status Partially Met             Patient will benefit from skilled therapeutic intervention in order to improve the following deficits and  impairments:     Visit Diagnosis: Unspecified open wound of abdominal wall, periumbilic region without penetration into peritoneal cavity, subsequent encounter      G-Codes - October 15, 2015 1334    Functional Assessment Tool Used based on slough and wound bed status    Functional Limitation  Other PT primary   Other PT Primary Goal Status [phone]) At least 20 percent but less than 40 percent impaired, limited or restricted   Other PT Primary Discharge Status [phone]) At least 1 percent but less than 20 percent impaired, limited or restricted       Problem List Patient Active Problem List   Diagnosis Date Noted  . Degeneration of lumbar or lumbosacral intervertebral disc 12/13/2014  . Anemia associated with nutritional deficiency 11/12/2014  . Bipolar affective disorder ([place]) 09/28/2014  . Anxiety state 09/28/2014  . S/P laparoscopic sleeve gastrectomy 01/2014 09/28/2014  . Abdominal wall seroma - chronic   . Seroma   . Cholelithiases 04/25/2013  . Cough 09/19/2011  . Smoker 09/19/2011  . GERD 03/04/2010  . RECTAL BLEEDING 03/04/2010  . DYSPHAGIA UNSPECIFIED 03/04/2010  . DYSPHAGIA 03/04/2010  . RUQ PAIN 03/04/2010   Rayetta Humphrey, PT CLT [phone] 10/02/2015, 1:35 PM  [place] [address] [place], [place], [address] Phone: [phone]   Fax:  [phone]  Name: [name] MRN: [id number] Date of Birth: 10/09/1975  PHYSICAL THERAPY DISCHARGE SUMMARY  Visits from Start of Care: 5  Current functional level related to goals / functional outcomes: See above   Remaining deficits: See above   Education / Equipment: Self care for wound  Plan: Patient agrees to discharge.  Patient goals were partially met. Patient is being discharged due to being pleased with the current functional level.  ?????        Wound no longer requires skilled care.  Rayetta Humphrey, [place] CLT [phone]

## 2015-10-04 ENCOUNTER — Ambulatory Visit (HOSPITAL_COMMUNITY): Payer: Medicare Other

## 2015-10-09 DIAGNOSIS — F319 Bipolar disorder, unspecified: Secondary | ICD-10-CM | POA: Diagnosis not present

## 2015-10-30 ENCOUNTER — Encounter: Payer: Self-pay | Admitting: Dietician

## 2015-10-30 ENCOUNTER — Encounter: Payer: Medicare Other | Attending: Internal Medicine | Admitting: Dietician

## 2015-10-30 DIAGNOSIS — Z48815 Encounter for surgical aftercare following surgery on the digestive system: Secondary | ICD-10-CM | POA: Insufficient documentation

## 2015-10-30 DIAGNOSIS — G894 Chronic pain syndrome: Secondary | ICD-10-CM | POA: Diagnosis not present

## 2015-10-30 DIAGNOSIS — E669 Obesity, unspecified: Secondary | ICD-10-CM | POA: Diagnosis not present

## 2015-10-30 DIAGNOSIS — M170 Bilateral primary osteoarthritis of knee: Secondary | ICD-10-CM | POA: Diagnosis not present

## 2015-10-30 DIAGNOSIS — Z6832 Body mass index (BMI) 32.0-32.9, adult: Secondary | ICD-10-CM | POA: Insufficient documentation

## 2015-10-30 DIAGNOSIS — Z713 Dietary counseling and surveillance: Secondary | ICD-10-CM | POA: Insufficient documentation

## 2015-10-30 DIAGNOSIS — I10 Essential (primary) hypertension: Secondary | ICD-10-CM | POA: Insufficient documentation

## 2015-10-30 DIAGNOSIS — K219 Gastro-esophageal reflux disease without esophagitis: Secondary | ICD-10-CM | POA: Insufficient documentation

## 2015-10-30 DIAGNOSIS — Z9884 Bariatric surgery status: Secondary | ICD-10-CM | POA: Diagnosis not present

## 2015-10-30 DIAGNOSIS — D509 Iron deficiency anemia, unspecified: Secondary | ICD-10-CM | POA: Diagnosis not present

## 2015-10-30 DIAGNOSIS — M47817 Spondylosis without myelopathy or radiculopathy, lumbosacral region: Secondary | ICD-10-CM | POA: Diagnosis not present

## 2015-10-30 DIAGNOSIS — M5417 Radiculopathy, lumbosacral region: Secondary | ICD-10-CM | POA: Diagnosis not present

## 2015-10-30 NOTE — Patient Instructions (Addendum)
-  Keep higher protein options in the house for you and [place] a protein food with each meal and snack and eat these foods first  -Eggs, [employer], [place] sausage, cheese, deli meat, yogurt  -Avoid buying your trigger foods    Goal: maintain weight!  -Avoid sweet tea -[name]: always put Splenda in [name]'s tea (no sugar!)  -Keep 40-calorie Fudgsicles on hand  -Allow yourself to ONLY eat sitting at the bar  -Eat slowly and enjoy your food  -Consider talking to your doctor about seeing a physical therapist for back pain

## 2015-10-30 NOTE — Progress Notes (Signed)
  Follow-up visit:  1.75 years Post-Operative Gastric Sleeve Surgery  Medical Nutrition Therapy:  Appt start time: [id number] end time: 1045  Primary concerns today: Post-operative Bariatric Surgery Nutrition Management. [name] returns having  maintained her weight. She states she feels like she is no longer restricted in how much food she can eat. She reports eating a lot of sweets. Feels like apples get stuck. Also has a stuck feeling if she eats too fast.    Surgery date: 02/13/2014 Surgery type: Sleeve Start weight at [facility]: 314 lbs on 06/30/2013 Weight today: 240 Weight change: 0 Total weight lost: 74 lbs   Preferred Learning Style:   No preference indicated   Learning Readiness:   Ready  24-hr recall: B (7AM): Special K with raisins and 1% milk and Splenda L: 1 hot dog with bun  S: sugar free fudge bars, no sugar added ice cream  D: none Snk:  Fluid intake: 64 ounces (Coke, [employer], diet Sundrop, juice, chocolate milk, water "at night with medicines"); 1% milk Estimated total protein intake: difficult to assess  Medications: see list Supplementation: taking "sometimes"  Drinking while eating: no Hair loss: yes, severe Carbonated beverages: diet sodas  Recent physical activity:  none  Progress Towards Goal(s):  Some progress.   Nutritional Diagnosis:  [place]-3.3 Overweight/obesity related to past poor dietary habits and physical inactivity as evidenced by patient w/ recent gastric sleeve surgery following dietary guidelines for continued weight loss.  Intervention:  Nutrition counseling provided.  Teaching Method Utilized:  Visual Auditory Hands on  Barriers to learning/adherence to lifestyle change: emotional eating  Demonstrated degree of understanding via:  Teach Back   Monitoring/Evaluation:  Dietary intake, exercise, and body weight. Follow up in 6 weeks.

## 2015-11-25 DIAGNOSIS — M47817 Spondylosis without myelopathy or radiculopathy, lumbosacral region: Secondary | ICD-10-CM | POA: Diagnosis not present

## 2015-11-25 DIAGNOSIS — M5417 Radiculopathy, lumbosacral region: Secondary | ICD-10-CM | POA: Diagnosis not present

## 2015-11-25 DIAGNOSIS — M170 Bilateral primary osteoarthritis of knee: Secondary | ICD-10-CM | POA: Diagnosis not present

## 2015-11-25 DIAGNOSIS — G894 Chronic pain syndrome: Secondary | ICD-10-CM | POA: Diagnosis not present

## 2015-12-04 DIAGNOSIS — F3164 Bipolar disorder, current episode mixed, severe, with psychotic features: Secondary | ICD-10-CM | POA: Diagnosis not present

## 2015-12-10 ENCOUNTER — Encounter: Payer: Medicare Other | Attending: Internal Medicine | Admitting: Dietician

## 2015-12-10 ENCOUNTER — Encounter: Payer: Self-pay | Admitting: Dietician

## 2015-12-10 DIAGNOSIS — Z9884 Bariatric surgery status: Secondary | ICD-10-CM | POA: Insufficient documentation

## 2015-12-10 DIAGNOSIS — E669 Obesity, unspecified: Secondary | ICD-10-CM | POA: Diagnosis not present

## 2015-12-10 DIAGNOSIS — Z713 Dietary counseling and surveillance: Secondary | ICD-10-CM | POA: Diagnosis not present

## 2015-12-10 DIAGNOSIS — Z6832 Body mass index (BMI) 32.0-32.9, adult: Secondary | ICD-10-CM | POA: Insufficient documentation

## 2015-12-10 DIAGNOSIS — D509 Iron deficiency anemia, unspecified: Secondary | ICD-10-CM | POA: Insufficient documentation

## 2015-12-10 DIAGNOSIS — Z48815 Encounter for surgical aftercare following surgery on the digestive system: Secondary | ICD-10-CM | POA: Diagnosis not present

## 2015-12-10 DIAGNOSIS — K219 Gastro-esophageal reflux disease without esophagitis: Secondary | ICD-10-CM | POA: Diagnosis not present

## 2015-12-10 DIAGNOSIS — I10 Essential (primary) hypertension: Secondary | ICD-10-CM | POA: Insufficient documentation

## 2015-12-10 NOTE — Patient Instructions (Addendum)
-  Continue to avoid skipping meals  -Continue to put Splenda in your tea -Avoid regular sodas -Keep 40-calorie fudgsicles on hand  Goal: 220 lbs

## 2015-12-10 NOTE — Progress Notes (Signed)
  Follow-up visit:  1.75 years Post-Operative Gastric Sleeve Surgery  Medical Nutrition Therapy:  Appt start time: 925 end time: 26  Primary concerns today: Post-operative Bariatric Surgery Nutrition Management. Delynn returns having lost 2.5 lbs since last visit. Does not have as much of an appetite since starting Adderall. Has been having Splenda in her tea instead of sugar. Having rawness and wounds under abdominal folds.   Surgery date: 02/13/2014 Surgery type: Sleeve Start weight at [facility]: 314 lbs on 06/30/2013 Weight today: 237.5 lbs Weight change: 2.5 lbs lost Total weight lost: 76.5 lbs   Preferred Learning Style:   No preference indicated   Learning Readiness:   Ready  24-hr recall: B (7AM): Cheerios L: another bowl of Cheerios  S: sugar free fudge bars, no sugar added ice cream  D: yogurt or jello Snk:  Fluid intake: 64 ounces (Coke, [employer], diet Sundrop, juice, chocolate milk, water "at night with medicines"); 1% milk Estimated total protein intake: difficult to assess  Medications: see list Supplementation: taking "sometimes"  Drinking while eating: no Hair loss: yes, severe Carbonated beverages: diet sodas  Recent physical activity:  none  Progress Towards Goal(s):  Some progress.   Nutritional Diagnosis:  [place]-3.3 Overweight/obesity related to past poor dietary habits and physical inactivity as evidenced by patient w/ recent gastric sleeve surgery following dietary guidelines for continued weight loss.  Intervention:  Nutrition counseling provided.  Teaching Method Utilized:  Visual Auditory Hands on  Barriers to learning/adherence to lifestyle change: emotional eating  Demonstrated degree of understanding via:  Teach Back   Monitoring/Evaluation:  Dietary intake, exercise, and body weight. Follow up in 6 weeks.

## 2015-12-11 DIAGNOSIS — F319 Bipolar disorder, unspecified: Secondary | ICD-10-CM | POA: Diagnosis not present

## 2015-12-19 ENCOUNTER — Ambulatory Visit (INDEPENDENT_AMBULATORY_CARE_PROVIDER_SITE_OTHER): Payer: Medicare Other | Admitting: *Deleted

## 2015-12-19 DIAGNOSIS — Z23 Encounter for immunization: Secondary | ICD-10-CM | POA: Diagnosis not present

## 2015-12-19 NOTE — Progress Notes (Signed)
Patient ID: [name], female   DOB: Jun 08, 1975, 40 y.o.   MRN: [id number] Patient seen in office for Influenza Vaccination.   Tolerated IM administration well.   Immunization history updated.

## 2015-12-23 DIAGNOSIS — M5417 Radiculopathy, lumbosacral region: Secondary | ICD-10-CM | POA: Diagnosis not present

## 2015-12-23 DIAGNOSIS — M47817 Spondylosis without myelopathy or radiculopathy, lumbosacral region: Secondary | ICD-10-CM | POA: Diagnosis not present

## 2015-12-23 DIAGNOSIS — G894 Chronic pain syndrome: Secondary | ICD-10-CM | POA: Diagnosis not present

## 2015-12-23 DIAGNOSIS — M170 Bilateral primary osteoarthritis of knee: Secondary | ICD-10-CM | POA: Diagnosis not present

## 2015-12-25 DIAGNOSIS — F319 Bipolar disorder, unspecified: Secondary | ICD-10-CM | POA: Diagnosis not present

## 2016-01-21 DIAGNOSIS — M47817 Spondylosis without myelopathy or radiculopathy, lumbosacral region: Secondary | ICD-10-CM | POA: Diagnosis not present

## 2016-01-21 DIAGNOSIS — M5417 Radiculopathy, lumbosacral region: Secondary | ICD-10-CM | POA: Diagnosis not present

## 2016-01-21 DIAGNOSIS — M170 Bilateral primary osteoarthritis of knee: Secondary | ICD-10-CM | POA: Diagnosis not present

## 2016-01-21 DIAGNOSIS — G894 Chronic pain syndrome: Secondary | ICD-10-CM | POA: Diagnosis not present

## 2016-01-29 DIAGNOSIS — F319 Bipolar disorder, unspecified: Secondary | ICD-10-CM | POA: Diagnosis not present

## 2016-02-11 ENCOUNTER — Other Ambulatory Visit: Payer: Self-pay | Admitting: Physician Assistant

## 2016-02-12 ENCOUNTER — Ambulatory Visit: Payer: Medicare Other | Admitting: Dietician

## 2016-02-20 DIAGNOSIS — M47817 Spondylosis without myelopathy or radiculopathy, lumbosacral region: Secondary | ICD-10-CM | POA: Diagnosis not present

## 2016-02-20 DIAGNOSIS — M5417 Radiculopathy, lumbosacral region: Secondary | ICD-10-CM | POA: Diagnosis not present

## 2016-02-20 DIAGNOSIS — G894 Chronic pain syndrome: Secondary | ICD-10-CM | POA: Diagnosis not present

## 2016-02-20 DIAGNOSIS — M170 Bilateral primary osteoarthritis of knee: Secondary | ICD-10-CM | POA: Diagnosis not present

## 2016-03-19 DIAGNOSIS — M47817 Spondylosis without myelopathy or radiculopathy, lumbosacral region: Secondary | ICD-10-CM | POA: Diagnosis not present

## 2016-03-19 DIAGNOSIS — G894 Chronic pain syndrome: Secondary | ICD-10-CM | POA: Diagnosis not present

## 2016-03-19 DIAGNOSIS — M170 Bilateral primary osteoarthritis of knee: Secondary | ICD-10-CM | POA: Diagnosis not present

## 2016-03-19 DIAGNOSIS — M5417 Radiculopathy, lumbosacral region: Secondary | ICD-10-CM | POA: Diagnosis not present

## 2016-04-01 DIAGNOSIS — F3163 Bipolar disorder, current episode mixed, severe, without psychotic features: Secondary | ICD-10-CM | POA: Diagnosis not present

## 2016-04-17 DIAGNOSIS — M47817 Spondylosis without myelopathy or radiculopathy, lumbosacral region: Secondary | ICD-10-CM | POA: Diagnosis not present

## 2016-04-17 DIAGNOSIS — M170 Bilateral primary osteoarthritis of knee: Secondary | ICD-10-CM | POA: Diagnosis not present

## 2016-04-17 DIAGNOSIS — M5417 Radiculopathy, lumbosacral region: Secondary | ICD-10-CM | POA: Diagnosis not present

## 2016-04-17 DIAGNOSIS — G894 Chronic pain syndrome: Secondary | ICD-10-CM | POA: Diagnosis not present

## 2016-04-22 ENCOUNTER — Ambulatory Visit (INDEPENDENT_AMBULATORY_CARE_PROVIDER_SITE_OTHER): Payer: Medicare Other | Admitting: Physician Assistant

## 2016-04-22 ENCOUNTER — Encounter: Payer: Self-pay | Admitting: Physician Assistant

## 2016-04-22 VITALS — BP 130/92 | HR 90 | Temp 97.9°F | Resp 18 | Wt 250.0 lb

## 2016-04-22 DIAGNOSIS — T8189XD Other complications of procedures, not elsewhere classified, subsequent encounter: Secondary | ICD-10-CM | POA: Diagnosis not present

## 2016-04-22 DIAGNOSIS — T148XXA Other injury of unspecified body region, initial encounter: Secondary | ICD-10-CM

## 2016-04-22 DIAGNOSIS — R238 Other skin changes: Secondary | ICD-10-CM

## 2016-04-22 MED ORDER — SULFAMETHOXAZOLE-TRIMETHOPRIM 800-160 MG PO TABS: 1.0000 | ORAL_TABLET | Freq: Two times a day (BID) | ORAL | 0 refills | Status: DC

## 2016-04-22 NOTE — Progress Notes (Signed)
Patient ID: [name] MRN: [id number], DOB: 09-Feb-1976, 41 y.o. Date of Encounter: 04/22/2016, 5:08 PM    Chief Complaint:  Chief Complaint  Patient presents with  . abdominal wound     HPI: 41 y.o. year old female presents with above.   She has seen me regarding this same wound multiple times in the past. States that she ended up seeing wound care center and that it did get a little better -- but then after a while they released her. Since then it has recently gotten worse. She called, tried to get an appointment back with them but they said that she would need a referral from me. She thinks that she last went there last summer around June.  Says that recently after a bandage has been on the site all day-- it will look like pus on the bandage.     Home Meds:   Outpatient Medications Prior to Visit  Medication Sig Dispense Refill  . BUTRANS 15 MCG/HR PTWK APPLY 1 PATCH TOPICALLY ONCE A WEEK  0  . Calcium Carbonate-Vitamin D (CALCIUM + D PO) Take 1 tablet by mouth daily.    . clindamycin (CLINDAGEL) 1 % gel Apply topically 2 (two) times daily. 30 g 1  . clonazePAM (KLONOPIN) 1 MG tablet Take 1 mg by mouth 2 (two) times daily.     . ferrous sulfate 325 (65 FE) MG tablet Take 650 mg by mouth 2 (two) times daily.     . haloperidol (HALDOL) 5 MG tablet 1 tab in AM and 3 tab at HS    . meloxicam (MOBIC) 7.5 MG tablet TAKE 1 TO 2 TABLETS BY MOUTH EVERY DAY AS NEEDED PAIN  1  . Multiple Vitamins-Minerals (HAIR/SKIN/NAILS/BIOTIN) TABS Take 1 tablet by mouth 2 (two) times daily.    . norgestimate-ethinyl estradiol (ORTHO-CYCLEN,SPRINTEC,PREVIFEM) 0.25-35 MG-MCG tablet Take 1 tablet by mouth daily.    Marland Kitchen NUVIGIL 250 MG tablet Take 250 mg by mouth daily.  5  . ondansetron (ZOFRAN ODT) 4 MG disintegrating tablet Take 1 tablet (4 mg total) by mouth every 8 (eight) hours as needed for nausea or vomiting. 40 tablet 0  . QUEtiapine (SEROQUEL) 300 MG tablet 2 hs    . valACYclovir (VALTREX)  500 MG tablet TAKE 1 TABLET (500 MG TOTAL) BY MOUTH DAILY AS NEEDED (FEVER BLISTER). 30 tablet 3  . zolpidem (AMBIEN) 10 MG tablet Take 10 mg by mouth at bedtime.     Marland Kitchen amphetamine-dextroamphetamine (ADDERALL XR) 30 MG 24 hr capsule Take 30 mg by mouth daily.    Marland Kitchen sulfamethoxazole-trimethoprim (BACTRIM DS,SEPTRA DS) 800-160 MG tablet Take 1 tablet by mouth 2 (two) times daily. (Patient not taking: Reported on 04/22/2016) 20 tablet 0   No facility-administered medications prior to visit.     Allergies:  Allergies  Allergen Reactions  . Nsaids   . Tylenol [Acetaminophen] Nausea And Vomiting      Review of Systems: See HPI for pertinent ROS. All other ROS negative.    Physical Exam: Blood pressure (!) 130/92, pulse 90, temperature 97.9 F (36.6 C), temperature source Oral, resp. rate 18, weight 250 lb (113.4 kg), last menstrual period 04/21/2016, SpO2 97 %., Body mass index is 42.25 kg/m. General:  Obese WF. Appears in no acute distress. Neck: Supple. No thyromegaly. No lymphadenopathy. Lungs: Clear bilaterally to auscultation without wheezes, rales, or rhonchi. Breathing is unlabored. Heart: Regular rhythm. No murmurs, rubs, or gallops. Msk:  Strength and tone normal for age. Skin:  Just right of/above- the umbilicus-- there is a 1.5 x 2.0 cm diameter wound. This is very shallow/superficial. No purulent drainage at present. No surrounding erythema at present. Neuro: Alert and oriented X 3. Moves all extremities spontaneously. Gait is normal. CNII-XII grossly in tact. Psych:  Responds to questions appropriately with a normal affect.     ASSESSMENT AND PLAN:  41 y.o. year old female with  1. Wound of skin Will put her on another round of Bactrim and will refer her back to the wound center for follow-up further management. In the past she did have follow-up with her surgeon and it was felt that the wound was superficial/stable and did not require further treatment from them. -  sulfamethoxazole-trimethoprim (BACTRIM DS,SEPTRA DS) 800-160 MG tablet; Take 1 tablet by mouth 2 (two) times daily.  Dispense: 20 tablet; Refill: 0 - AMB referral to wound care center  2. Non-healing surgical wound, subsequent encounter Will put her on another round of Bactrim and will refer her back to the wound center for follow-up further management. In the past she did have follow-up with her surgeon and it was felt that the wound was superficial/stable and did not require further treatment from them.  - sulfamethoxazole-trimethoprim (BACTRIM DS,SEPTRA DS) 800-160 MG tablet; Take 1 tablet by mouth 2 (two) times daily.  Dispense: 20 tablet; Refill: 0 - AMB referral to wound care center   Signed, [address] [place], [place], [facility] 04/22/2016 5:08 PM

## 2016-04-23 ENCOUNTER — Ambulatory Visit (HOSPITAL_COMMUNITY): Payer: Medicare Other | Admitting: Physical Therapy

## 2016-04-23 ENCOUNTER — Telehealth (HOSPITAL_COMMUNITY): Payer: Self-pay | Admitting: Physical Therapy

## 2016-04-23 NOTE — Telephone Encounter (Signed)
Pt called and cx frist apptment and rescheduled for this date. Nf 04/23/2016

## 2016-05-04 ENCOUNTER — Ambulatory Visit (HOSPITAL_COMMUNITY): Payer: Medicare Other | Attending: Physician Assistant | Admitting: Physical Therapy

## 2016-05-04 ENCOUNTER — Telehealth: Payer: Self-pay | Admitting: Physician Assistant

## 2016-05-04 NOTE — Telephone Encounter (Signed)
Pt was exposed to the flu and wants tamiflu called in because she is not feeling well. Her son was also exposed [name] 02/10/2005, and wants to know if he can get tamiflu as well.

## 2016-05-04 NOTE — Telephone Encounter (Signed)
Patient has not been seen since 2-28 tried calling patient to see what symptoms she was having and for how long.No answer on cell or home phone

## 2016-05-06 ENCOUNTER — Telehealth: Payer: Self-pay

## 2016-05-06 MED ORDER — OSELTAMIVIR PHOSPHATE 75 MG PO CAPS: 75.0000 mg | ORAL_CAPSULE | Freq: Every day | ORAL | 0 refills | Status: DC

## 2016-05-06 NOTE — Telephone Encounter (Signed)
Error

## 2016-05-06 NOTE — Telephone Encounter (Signed)
Patient had been around her mom who was diagnosed with the flu.Per Karis Juba it is okay to sen Rx for Tamiflu 75 mg

## 2016-05-15 DIAGNOSIS — M47817 Spondylosis without myelopathy or radiculopathy, lumbosacral region: Secondary | ICD-10-CM | POA: Diagnosis not present

## 2016-05-15 DIAGNOSIS — M5417 Radiculopathy, lumbosacral region: Secondary | ICD-10-CM | POA: Diagnosis not present

## 2016-05-15 DIAGNOSIS — M170 Bilateral primary osteoarthritis of knee: Secondary | ICD-10-CM | POA: Diagnosis not present

## 2016-05-15 DIAGNOSIS — G894 Chronic pain syndrome: Secondary | ICD-10-CM | POA: Diagnosis not present

## 2016-06-04 ENCOUNTER — Ambulatory Visit (HOSPITAL_COMMUNITY): Payer: Medicare Other | Attending: Physician Assistant | Admitting: Physical Therapy

## 2016-06-04 DIAGNOSIS — S31105D Unspecified open wound of abdominal wall, periumbilic region without penetration into peritoneal cavity, subsequent encounter: Secondary | ICD-10-CM | POA: Insufficient documentation

## 2016-06-04 DIAGNOSIS — X58XXXD Exposure to other specified factors, subsequent encounter: Secondary | ICD-10-CM | POA: Diagnosis not present

## 2016-06-04 NOTE — Therapy (Signed)
[place], [place], [address] Phone: [phone]   Fax:  [phone]  Wound Care Evaluation  Patient Details  Name: [name] MRN: [id number] Date of Birth: [birth year] Referring Provider: Dena Billet   Encounter Date: 06/04/2016      PT End of Session - 06/04/16 1642    Visit Number 1   Number of Visits 12   Date for PT Re-Evaluation 07/04/16   Authorization Type Medicaree   Authorization - Visit Number 1   Authorization - Number of Visits 12   PT Start Time 1430   PT Stop Time 1505   PT Time Calculation (min) 35 min   Activity Tolerance Patient tolerated treatment well      Past Medical History:  Diagnosis Date  . Anemia   . Anxiety   . Arthritis   . Asthma   . Bipolar affective ([place])   . Blood transfusion without reported diagnosis   . Colostomy in place [facility])   . Depression   . Diverticulitis 2008   perforated/ requiring resection  . Family history of adverse reaction to anesthesia    MGM was placed on vent after surgery  . GERD (gastroesophageal reflux disease)   . Headache   . Shortness of breath dyspnea   . Sleep apnea    mild, does not use c-pap machine  . Substance abuse    12 years ago-crack cocaine    Past Surgical History:  Procedure Laterality Date  . Abdominal wall hernia  07/2007   open repair with lysis of adhesions  . COLONOSCOPY  03/13/2010   BSJ:[id number] hemorrhoids likely cause of hematochezia, otherwise normal  . Colostomy reversal  11/2006  . ESOPHAGOGASTRODUODENOSCOPY  03/13/2010   UTM:[id number] esophagus, status post passage of a Maloney dilator/Small hiatal hernia/ Antral erosions, status post biopsy  . EXAMINATION UNDER ANESTHESIA N/A 05/11/2012   Procedure: EXAM UNDER ANESTHESIA;  Surgeon: Donato Heinz, MD;  Location: [facility];  Service: General;  Laterality: N/A;  . Exploratory laparotomy with resection  2008  . FINGER CLOSED REDUCTION Right 08/05/2012    Procedure: CLOSED REDUCTION RIGHT THUMB (FINGER);  Surgeon: Linna Hoff, MD;  Location: [facility];  Service: Orthopedics;  Laterality: Right;  . HERNIA REPAIR    . Hx of abd wall seroma  08/2007   Drained via [place] in [place], [place]  . Hx of abd wall seroma  10/2007   Drained by Dr. Geroge Baseman in office  . Kidney stones    . LAPAROSCOPIC GASTRIC SLEEVE RESECTION N/A 02/13/2014   Procedure: LAPAROSCOPIC GASTRIC SLEEVE RESECTION LYSIS OF ADHESIONS, UPPER ENDOSCOPY;  Surgeon: [name], MD;  Location: [facility];  Service: General;  Laterality: N/A;  . LAPAROSCOPY N/A 09/28/2014   Procedure: LAPAROSCOPY DIAGNOSTIC, INCISION AND DRAINAGE WITH LAPAROSCOPIC EXPLORATION OF ABDOMINAL WALL SEROMA with ultrasound;  Surgeon: Greer Pickerel, MD;  Location: [facility];  Service: General;  Laterality: N/A;  . PLACEMENT OF SETON N/A 05/11/2012   Procedure: PLACEMENT OF SETON;  Surgeon: Donato Heinz, MD;  Location: [facility];  Service: General;  Laterality: N/A;  . root canal 7-16    . TONSILLECTOMY    . TREATMENT FISTULA ANAL      There were no vitals filed for this visit.        [facility] PT Assessment - 06/04/16 0001      Assessment   Medical Diagnosis non healing wound    Referring Provider Dena Billet  Onset Date/Surgical Date 03/10/15   Next MD Visit unknown   Prior Therapy in July of 2017     Precautions   Precautions None     Restrictions   Weight Bearing Restrictions No     Balance Screen   Has the patient fallen in the past 6 months No   Has the patient had a decrease in activity level because of a fear of falling?  Yes   Is the patient reluctant to leave their home because of a fear of falling?  No     Home Environment   Living Environment Private residence         Wound Therapy - 06/04/16 1631    Subjective [name] was seen in July of last year for a wound along her abdominal insicion.  She states that the wound almost immediately began getting worse once she was discharge to self care  in August.  She has been cleansing the wound in the shower and covering it up so that it won't drain on her clothing    Patient and Family Stated Goals for the wound to heal    Date of Onset --  03/10/2015   Prior Treatments self care, therapy    Pain Assessment 0-10   Pain Score 4    Pain Type Chronic pain   Pain Location Abdomen   Pain Orientation Mid   Pain Descriptors / Indicators Tender   Pain Onset With Activity   Patients Stated Pain Goal 0   Pain Intervention(s) Emotional support   Evaluation and Treatment Procedures Explained to Patient/Family Yes   Evaluation and Treatment Procedures agreed to   Wound Properties Date First Assessed: 06/04/16 Time First Assessed: 1435 Wound Type: Other (Comment) , non-healing wound   Location: Abdomen Location Orientation: Distal;Mid Wound Description (Comments): abdomen just above umbilicus  Present on Admission: Yes   Dressing Type Gauze (Comment)   Dressing Changed Changed   Dressing Status Clean;Old drainage   Dressing Change Frequency PRN   Site / Wound Assessment Pale;Yellow   % Wound base Red or Granulating 0%   % Wound base Yellow/Fibrinous Exudate 100%   Wound Length (cm) 6 cm   Wound Width (cm) 1.7 cm   Treatment Cleansed;Debridement (Selective)   Selective Debridement - Location wound bed  wound bed   Selective Debridement - Tools Used Scalpel   Selective Debridement - Tissue Removed slouth    Wound Therapy - Clinical Statement [name] is a 41 yo female who has had an open wound on her abdomen for over a year.  The wound is progressing in size and drainage therefore she is being referred to skilled physical therapy.  She will benefit from skilled physical therapy to create a healing envirornment for the wound to prevent infection.    Hydrotherapy Plan Debridement;Dressing change;Patient/family education   Wound Therapy - Frequency --  BIW x 6 weeks    Wound Therapy - Current Recommendations PT   Wound Plan debride and  dressing change    Dressing  silveralginate followed by 4x4, abpad and tape                          PT Education - 06/04/16 1643    Education provided Yes   Education Details The benefit of wearing her abdominal binder so the incision does not rip apart   Person(s) Educated Patient   Methods Explanation   Comprehension Verbalized understanding  PT Short Term Goals - 06-19-16 1644      PT SHORT TERM GOAL #1   Title Pt to be wearing an abdominal binder to hold incision together    Time 1   Period Weeks   Status New     PT SHORT TERM GOAL #2   Title Wound drainage to be scant to reduce risk of infection an stop soiling clothing    Time 3   Period Weeks   Status New     PT SHORT TERM GOAL #3   Title wound to be 80% granulated to provide a healing enviornment.    Time 3   Period Weeks   Status New           PT Long Term Goals - 06/19/16 1646      PT LONG TERM GOAL #1   Title Wound to be 100% granulated to allow healing to occur   Time 6   Period Weeks   Status New     PT LONG TERM GOAL #2   Title no drainage so that clothes are not soiled   Time 6   Period Weeks   Status New     PT LONG TERM GOAL #3   Title Pt wound to be less than 2 x .5 cm to allow pt to feel confident in self care.    Time 6   Period Weeks   Status New              Plan - 19-Jun-2016 1644    Clinical Impression Statement see above       Patient will benefit from skilled therapeutic intervention in order to improve the following deficits and impairments:     Visit Diagnosis: Unspecified open wound of abdominal wall, periumbilic region without penetration into peritoneal cavity, subsequent encounter      G-Codes - 19-Jun-2016 1648    Functional Assessment Tool Used (Outpatient Only) clinical judgement;  drainage and granulation of wound    Functional Limitation Other PT primary   Other PT Primary Current Status (V9563) At least 80 percent but less  than 100 percent impaired, limited or restricted   Other PT Primary Goal Status (O7564) At least 1 percent but less than 20 percent impaired, limited or restricted      Problem List Patient Active Problem List   Diagnosis Date Noted  . Degeneration of lumbar or lumbosacral intervertebral disc 12/13/2014  . Anemia associated with nutritional deficiency 11/12/2014  . Bipolar affective disorder ([place]) 09/28/2014  . Anxiety state 09/28/2014  . S/P laparoscopic sleeve gastrectomy 01/2014 09/28/2014  . Abdominal wall seroma - chronic   . Seroma   . Cholelithiases 04/25/2013  . Cough 09/19/2011  . Smoker 09/19/2011  . GERD 03/04/2010  . RECTAL BLEEDING 03/04/2010  . DYSPHAGIA UNSPECIFIED 03/04/2010  . DYSPHAGIA 03/04/2010  . RUQ PAIN 03/04/2010    Rayetta Humphrey, PT CLT [phone] 06-19-2016, 4:49 PM  [place] [address] [place], [place], [address] Phone: [phone]   Fax:  [phone]  Name: [name] MRN: [id number] Date of Birth: April 10, 1975

## 2016-06-09 ENCOUNTER — Ambulatory Visit (HOSPITAL_COMMUNITY): Payer: Medicare Other

## 2016-06-09 DIAGNOSIS — S31105D Unspecified open wound of abdominal wall, periumbilic region without penetration into peritoneal cavity, subsequent encounter: Secondary | ICD-10-CM

## 2016-06-09 NOTE — Therapy (Signed)
[place], [place], [address] Phone: [phone]   Fax:  [phone]  Wound Care Therapy  Patient Details  Name: [name] MRN: [id number] Date of Birth: 1975-05-22 Referring Provider: Dena Billet   Encounter Date: 06/09/2016      PT End of Session - 06/09/16 1707    Visit Number 2   Number of Visits 12   Date for PT Re-Evaluation 07/04/16   Authorization Type Medicaree   Authorization - Visit Number 2   Authorization - Number of Visits 12   PT Start Time 1025   PT Stop Time 1706   PT Time Calculation (min) 21 min   Activity Tolerance Patient tolerated treatment well;No increased pain   Behavior During Therapy WFL for tasks assessed/performed      Past Medical History:  Diagnosis Date  . Anemia   . Anxiety   . Arthritis   . Asthma   . Bipolar affective ([place])   . Blood transfusion without reported diagnosis   . Colostomy in place [facility])   . Depression   . Diverticulitis 2008   perforated/ requiring resection  . Family history of adverse reaction to anesthesia    MGM was placed on vent after surgery  . GERD (gastroesophageal reflux disease)   . Headache   . Shortness of breath dyspnea   . Sleep apnea    mild, does not use c-pap machine  . Substance abuse    12 years ago-crack cocaine    Past Surgical History:  Procedure Laterality Date  . Abdominal wall hernia  07/2007   open repair with lysis of adhesions  . COLONOSCOPY  03/13/2010   ENI:DPOEUMPN hemorrhoids likely cause of hematochezia, otherwise normal  . Colostomy reversal  11/2006  . ESOPHAGOGASTRODUODENOSCOPY  03/13/2010   TIR:[id number] esophagus, status post passage of a Maloney dilator/Small hiatal hernia/ Antral erosions, status post biopsy  . EXAMINATION UNDER ANESTHESIA N/A 05/11/2012   Procedure: EXAM UNDER ANESTHESIA;  Surgeon: Donato Heinz, MD;  [facility];  Service: General;  Laterality: N/A;  . Exploratory  laparotomy with resection  2008  . FINGER CLOSED REDUCTION Right 08/05/2012   Procedure: CLOSED REDUCTION RIGHT THUMB (FINGER);  Surgeon: Linna Hoff, MD;  Location: [place];  Service: Orthopedics;  Laterality: Right;  . HERNIA REPAIR    . Hx of abd wall seroma  08/2007   Drained via [place] in [place], [place]  . Hx of abd wall seroma  10/2007   Drained by Dr. Geroge Baseman in office  . Kidney stones    . LAPAROSCOPIC GASTRIC SLEEVE RESECTION N/A 02/13/2014   Procedure: LAPAROSCOPIC GASTRIC SLEEVE RESECTION LYSIS OF ADHESIONS, UPPER ENDOSCOPY;  Surgeon: [name], MD;  [facility];  Service: General;  Laterality: N/A;  . LAPAROSCOPY N/A 09/28/2014   Procedure: LAPAROSCOPY DIAGNOSTIC, INCISION AND DRAINAGE WITH LAPAROSCOPIC EXPLORATION OF ABDOMINAL WALL SEROMA with ultrasound;  Surgeon: Greer Pickerel, MD;  [facility];  Service: General;  Laterality: N/A;  . PLACEMENT OF SETON N/A 05/11/2012   Procedure: PLACEMENT OF SETON;  Surgeon: Donato Heinz, MD;  [facility];  Service: General;  Laterality: N/A;  . root canal 7-16    . TONSILLECTOMY    . TREATMENT FISTULA ANAL      There were no vitals filed for this visit.       Subjective Assessment - 06/09/16 1701    Subjective No reports of pain.  Pt arrived with dressings  intact, reports of an odor from dressings.                   Wound Therapy - 06/09/16 1702    Subjective No reports of pain.  Pt arrived with dressings intact, reports of an odor from dressings.   Patient and Family Stated Goals for the wound to heal    Prior Treatments self care, therapy    Pain Assessment No/denies pain   Evaluation and Treatment Procedures Explained to Patient/Family Yes   Evaluation and Treatment Procedures agreed to   Wound Properties Date First Assessed: 06/04/16 Time First Assessed: 1435 Wound Type: Other (Comment) , non-healing wound   Location: Abdomen Location Orientation: Distal;Mid Wound Description (Comments): abdomen just  above umbilicus  Present on Admission: Yes   Dressing Type Silver hydrofiber;Gauze (Comment);Abdominal pads  silver hydrofiber, 4x4, ABD pad and tape   Dressing Changed Changed   Dressing Status Clean;Old drainage   Dressing Change Frequency PRN   Site / Wound Assessment Pale;Yellow   % Wound base Red or Granulating 80%   % Wound base Yellow/Fibrinous Exudate 20%   Treatment Cleansed;Debridement (Selective)   Selective Debridement - Location wound bed   Selective Debridement - Tools Used Scalpel   Selective Debridement - Tissue Removed slouth    Wound Therapy - Clinical Statement Pt arrived with dressings intact and reports of an odor from dressings.  Upon removal noted significant amount of slough adherent to ABD pad.  Selective debridement for removal of slough from wound bed.  Continued wiht silver hydrofiber, ABD pad for draininage with medipore tape.     Hydrotherapy Plan Debridement;Dressing change;Patient/family education   Wound Therapy - Frequency --  2x/week for 6 weeks   Wound Therapy - Current Recommendations PT   Wound Plan debride and dressing change    Dressing  silveralginate followed by 4x4, abpad and tape                    PT Short Term Goals - 06/04/16 1644      PT SHORT TERM GOAL #1   Title Pt to be wearing an abdominal binder to hold incision together    Time 1   Period Weeks   Status New     PT SHORT TERM GOAL #2   Title Wound drainage to be scant to reduce risk of infection an stop soiling clothing    Time 3   Period Weeks   Status New     PT SHORT TERM GOAL #3   Title wound to be 80% granulated to provide a healing enviornment.    Time 3   Period Weeks   Status New           PT Long Term Goals - 06/04/16 1646      PT LONG TERM GOAL #1   Title Wound to be 100% granulated to allow healing to occur   Time 6   Period Weeks   Status New     PT LONG TERM GOAL #2   Title no drainage so that clothes are not soiled   Time 6    Period Weeks   Status New     PT LONG TERM GOAL #3   Title Pt wound to be less than 2 x .5 cm to allow pt to feel confident in self care.    Time 6   Period Weeks   Status New  Patient will benefit from skilled therapeutic intervention in order to improve the following deficits and impairments:     Visit Diagnosis: Unspecified open wound of abdominal wall, periumbilic region without penetration into peritoneal cavity, subsequent encounter     Problem List Patient Active Problem List   Diagnosis Date Noted  . Degeneration of lumbar or lumbosacral intervertebral disc 12/13/2014  . Anemia associated with nutritional deficiency 11/12/2014  . Bipolar affective disorder ([place]) 09/28/2014  . Anxiety state 09/28/2014  . S/P laparoscopic sleeve gastrectomy 01/2014 09/28/2014  . Abdominal wall seroma - chronic   . Seroma   . Cholelithiases 04/25/2013  . Cough 09/19/2011  . Smoker 09/19/2011  . GERD 03/04/2010  . RECTAL BLEEDING 03/04/2010  . DYSPHAGIA UNSPECIFIED 03/04/2010  . DYSPHAGIA 03/04/2010  . RUQ PAIN 03/04/2010   Ihor Austin, [place]; [place]  Aldona Lento 06/09/2016, 5:08 PM  [place], [place], [address] Phone: [phone]   Fax:  [phone]  Name: JAMESINA GAUGH MRN: [id number] Date of Birth: 01-10-1976

## 2016-06-11 ENCOUNTER — Ambulatory Visit (HOSPITAL_COMMUNITY): Payer: Medicare Other

## 2016-06-11 DIAGNOSIS — S31105D Unspecified open wound of abdominal wall, periumbilic region without penetration into peritoneal cavity, subsequent encounter: Secondary | ICD-10-CM

## 2016-06-11 NOTE — Therapy (Signed)
[place], [place], [address] Phone: [phone]   Fax:  [phone]  Wound Care Therapy  Patient Details  Name: [name] MRN: [id number] Date of Birth: 1975/09/10 Referring Provider: Dena Billet   Encounter Date: 06/11/2016      PT End of Session - 06/11/16 1722    Visit Number 3   Number of Visits 12   Date for PT Re-Evaluation 07/04/16   Authorization Type Medicaree   Authorization - Visit Number 3   Authorization - Number of Visits 12   PT Start Time 2409   PT Stop Time 1704   PT Time Calculation (min) 14 min   Activity Tolerance Patient tolerated treatment well;No increased pain   Behavior During Therapy WFL for tasks assessed/performed      Past Medical History:  Diagnosis Date  . Anemia   . Anxiety   . Arthritis   . Asthma   . Bipolar affective ([place])   . Blood transfusion without reported diagnosis   . Colostomy in place [facility])   . Depression   . Diverticulitis 2008   perforated/ requiring resection  . Family history of adverse reaction to anesthesia    MGM was placed on vent after surgery  . GERD (gastroesophageal reflux disease)   . Headache   . Shortness of breath dyspnea   . Sleep apnea    mild, does not use c-pap machine  . Substance abuse    12 years ago-crack cocaine    Past Surgical History:  Procedure Laterality Date  . Abdominal wall hernia  07/2007   open repair with lysis of adhesions  . COLONOSCOPY  03/13/2010   BDZ:[id number] hemorrhoids likely cause of hematochezia, otherwise normal  . Colostomy reversal  11/2006  . ESOPHAGOGASTRODUODENOSCOPY  03/13/2010   STM:[id number] esophagus, status post passage of a Maloney dilator/Small hiatal hernia/ Antral erosions, status post biopsy  . EXAMINATION UNDER ANESTHESIA N/A 05/11/2012   Procedure: EXAM UNDER ANESTHESIA;  Surgeon: Donato Heinz, MD;  [facility];  Service: General;  Laterality: N/A;  . Exploratory  laparotomy with resection  2008  . FINGER CLOSED REDUCTION Right 08/05/2012   Procedure: CLOSED REDUCTION RIGHT THUMB (FINGER);  Surgeon: Linna Hoff, MD;  Location: [place];  Service: Orthopedics;  Laterality: Right;  . HERNIA REPAIR    . Hx of abd wall seroma  08/2007   Drained via [place] in [place], [place]  . Hx of abd wall seroma  10/2007   Drained by Dr. Geroge Baseman in office  . Kidney stones    . LAPAROSCOPIC GASTRIC SLEEVE RESECTION N/A 02/13/2014   Procedure: LAPAROSCOPIC GASTRIC SLEEVE RESECTION LYSIS OF ADHESIONS, UPPER ENDOSCOPY;  Surgeon: [name], MD;  [facility];  Service: General;  Laterality: N/A;  . LAPAROSCOPY N/A 09/28/2014   Procedure: LAPAROSCOPY DIAGNOSTIC, INCISION AND DRAINAGE WITH LAPAROSCOPIC EXPLORATION OF ABDOMINAL WALL SEROMA with ultrasound;  Surgeon: Greer Pickerel, MD;  [facility];  Service: General;  Laterality: N/A;  . PLACEMENT OF SETON N/A 05/11/2012   Procedure: PLACEMENT OF SETON;  Surgeon: Donato Heinz, MD;  [facility];  Service: General;  Laterality: N/A;  . root canal 7-16    . TONSILLECTOMY    . TREATMENT FISTULA ANAL      There were no vitals filed for this visit.       Subjective Assessment - 06/11/16 1716    Subjective No reports of pain today.  Pt arrived with  dressing intact and wearing a girdle.                     Wound Therapy - 06/11/16 1717    Subjective No reports of pain today.  Pt arrived with dressing intact and wearing a girdle.     Patient and Family Stated Goals for the wound to heal    Date of Onset --  03/10/15   Prior Treatments self care, therapy    Pain Assessment No/denies pain   Evaluation and Treatment Procedures Explained to Patient/Family Yes   Evaluation and Treatment Procedures agreed to   Wound Properties Date First Assessed: 06/04/16 Time First Assessed: 1435 Wound Type: Other (Comment) , non-healing wound   Location: Abdomen Location Orientation: Distal;Mid Wound Description (Comments):  abdomen just above umbilicus  Present on Admission: Yes   Dressing Type Silver hydrofiber;Gauze (Comment);Abdominal pads   Dressing Changed Changed   Dressing Status Clean;Old drainage   Dressing Change Frequency PRN   Site / Wound Assessment Pale;Yellow;Granulation tissue   % Wound base Red or Granulating 85%   % Wound base Yellow/Fibrinous Exudate 15%   Treatment Cleansed;Debridement (Selective)   Selective Debridement - Location wound bed   Selective Debridement - Tools Used Scalpel   Selective Debridement - Tissue Removed slouth    Wound Therapy - Clinical Statement No odor noted this session, has been 2 days since last session.  Selective debridement for removal of slough with improved granulation following debridement.  Continued wiht silver hydrofiber and medipore tape.  Discussed the healing process and encouraged pt to apply abdominal binder to promote approximation.  Pt stated she has tried them on and a little tight with noted rolling.  Applied size 9 spandage around waist for somce compression and pt reapplied girdle.  Requested pt to try on binders again and wear if possible to promote wound healing.     Hydrotherapy Plan Debridement;Dressing change;Patient/family education   Wound Therapy - Frequency --  2x/week for 6 weeks   Wound Therapy - Current Recommendations PT   Wound Plan debride and dressing change    Dressing  silveralginate followed by 4x4, abpad and tape; size 9 spandage around waist                   PT Short Term Goals - 06/04/16 1644      PT SHORT TERM GOAL #1   Title Pt to be wearing an abdominal binder to hold incision together    Time 1   Period Weeks   Status New     PT SHORT TERM GOAL #2   Title Wound drainage to be scant to reduce risk of infection an stop soiling clothing    Time 3   Period Weeks   Status New     PT SHORT TERM GOAL #3   Title wound to be 80% granulated to provide a healing enviornment.    Time 3   Period Weeks    Status New           PT Long Term Goals - 06/04/16 1646      PT LONG TERM GOAL #1   Title Wound to be 100% granulated to allow healing to occur   Time 6   Period Weeks   Status New     PT LONG TERM GOAL #2   Title no drainage so that clothes are not soiled   Time 6   Period Weeks   Status New  PT LONG TERM GOAL #3   Title Pt wound to be less than 2 x .5 cm to allow pt to feel confident in self care.    Time 6   Period Weeks   Status New             Patient will benefit from skilled therapeutic intervention in order to improve the following deficits and impairments:     Visit Diagnosis: Unspecified open wound of abdominal wall, periumbilic region without penetration into peritoneal cavity, subsequent encounter     Problem List Patient Active Problem List   Diagnosis Date Noted  . Degeneration of lumbar or lumbosacral intervertebral disc 12/13/2014  . Anemia associated with nutritional deficiency 11/12/2014  . Bipolar affective disorder ([place]) 09/28/2014  . Anxiety state 09/28/2014  . S/P laparoscopic sleeve gastrectomy 01/2014 09/28/2014  . Abdominal wall seroma - chronic   . Seroma   . Cholelithiases 04/25/2013  . Cough 09/19/2011  . Smoker 09/19/2011  . GERD 03/04/2010  . RECTAL BLEEDING 03/04/2010  . DYSPHAGIA UNSPECIFIED 03/04/2010  . DYSPHAGIA 03/04/2010  . RUQ PAIN 03/04/2010  Ihor Austin, [place]; [place]   Aldona Lento 06/11/2016, 5:23 PM  [place], [place], [address] Phone: [phone]   Fax:  [phone]  Name: [name] MRN: [id number] Date of Birth: [birth year]

## 2016-06-12 DIAGNOSIS — M170 Bilateral primary osteoarthritis of knee: Secondary | ICD-10-CM | POA: Diagnosis not present

## 2016-06-12 DIAGNOSIS — M47817 Spondylosis without myelopathy or radiculopathy, lumbosacral region: Secondary | ICD-10-CM | POA: Diagnosis not present

## 2016-06-12 DIAGNOSIS — G894 Chronic pain syndrome: Secondary | ICD-10-CM | POA: Diagnosis not present

## 2016-06-12 DIAGNOSIS — M5417 Radiculopathy, lumbosacral region: Secondary | ICD-10-CM | POA: Diagnosis not present

## 2016-06-15 ENCOUNTER — Ambulatory Visit (HOSPITAL_COMMUNITY): Payer: Medicare Other | Admitting: Physical Therapy

## 2016-06-15 DIAGNOSIS — S31105D Unspecified open wound of abdominal wall, periumbilic region without penetration into peritoneal cavity, subsequent encounter: Secondary | ICD-10-CM | POA: Diagnosis not present

## 2016-06-15 NOTE — Therapy (Signed)
[place], [place], [address] Phone: [phone]   Fax:  [phone]  Wound Care Therapy  Patient Details  Name: [name] MRN: [id number] Date of Birth: February 21, 1976 Referring Provider: Dena Billet   Encounter Date: 06/15/2016      PT End of Session - 06/15/16 1737    Visit Number 4   Number of Visits 12   Date for PT Re-Evaluation 07/04/16   Authorization Type Medicare   Authorization - Visit Number 4   Authorization - Number of Visits 12   PT Start Time 3716   PT Stop Time 9678   PT Time Calculation (min) 15 min   Activity Tolerance Patient tolerated treatment well;No increased pain   Behavior During Therapy WFL for tasks assessed/performed      Past Medical History:  Diagnosis Date  . Anemia   . Anxiety   . Arthritis   . Asthma   . Bipolar affective ([place])   . Blood transfusion without reported diagnosis   . Colostomy in place [facility])   . Depression   . Diverticulitis 2008   perforated/ requiring resection  . Family history of adverse reaction to anesthesia    MGM was placed on vent after surgery  . GERD (gastroesophageal reflux disease)   . Headache   . Shortness of breath dyspnea   . Sleep apnea    mild, does not use c-pap machine  . Substance abuse    12 years ago-crack cocaine    Past Surgical History:  Procedure Laterality Date  . Abdominal wall hernia  07/2007   open repair with lysis of adhesions  . COLONOSCOPY  03/13/2010   LFY:BOFBPZWC hemorrhoids likely cause of hematochezia, otherwise normal  . Colostomy reversal  11/2006  . ESOPHAGOGASTRODUODENOSCOPY  03/13/2010   HEN:[id number] esophagus, status post passage of a Maloney dilator/Small hiatal hernia/ Antral erosions, status post biopsy  . EXAMINATION UNDER ANESTHESIA N/A 05/11/2012   Procedure: EXAM UNDER ANESTHESIA;  Surgeon: Donato Heinz, MD;  [facility];  Service: General;  Laterality: N/A;  . Exploratory  laparotomy with resection  2008  . FINGER CLOSED REDUCTION Right 08/05/2012   Procedure: CLOSED REDUCTION RIGHT THUMB (FINGER);  Surgeon: Linna Hoff, MD;  Location: [place];  Service: Orthopedics;  Laterality: Right;  . HERNIA REPAIR    . Hx of abd wall seroma  08/2007   Drained via [place] in [place], [place]  . Hx of abd wall seroma  10/2007   Drained by Dr. Geroge Baseman in office  . Kidney stones    . LAPAROSCOPIC GASTRIC SLEEVE RESECTION N/A 02/13/2014   Procedure: LAPAROSCOPIC GASTRIC SLEEVE RESECTION LYSIS OF ADHESIONS, UPPER ENDOSCOPY;  Surgeon: [name], MD;  [facility];  Service: General;  Laterality: N/A;  . LAPAROSCOPY N/A 09/28/2014   Procedure: LAPAROSCOPY DIAGNOSTIC, INCISION AND DRAINAGE WITH LAPAROSCOPIC EXPLORATION OF ABDOMINAL WALL SEROMA with ultrasound;  Surgeon: Greer Pickerel, MD;  [facility];  Service: General;  Laterality: N/A;  . PLACEMENT OF SETON N/A 05/11/2012   Procedure: PLACEMENT OF SETON;  Surgeon: Donato Heinz, MD;  [facility];  Service: General;  Laterality: N/A;  . root canal 7-16    . TONSILLECTOMY    . TREATMENT FISTULA ANAL      There were no vitals filed for this visit.                  Wound Therapy - 06/15/16 1735  Subjective No reports of pain today.  Pt arrived with dressing intact and wearing a girdle.     Patient and Family Stated Goals for the wound to heal    Date of Onset --  03/10/15   Prior Treatments self care, therapy    Pain Assessment No/denies pain   Evaluation and Treatment Procedures Explained to Patient/Family Yes   Evaluation and Treatment Procedures agreed to   Wound Properties Date First Assessed: 06/04/16 Time First Assessed: 1435 Wound Type: Other (Comment) , non-healing wound   Location: Abdomen Location Orientation: Distal;Mid Wound Description (Comments): abdomen just above umbilicus  Present on Admission: Yes   Dressing Type Silver hydrofiber;Gauze (Comment);Abdominal pads   Dressing Changed  Changed   Dressing Status Clean;Old drainage   Dressing Change Frequency PRN   Site / Wound Assessment Pale;Yellow;Granulation tissue   % Wound base Red or Granulating 95%   % Wound base Yellow/Fibrinous Exudate 5%   Treatment Cleansed;Debridement (Selective)   Selective Debridement - Location wound bed   Selective Debridement - Tools Used Scalpel   Selective Debridement - Tissue Removed slouth    Wound Therapy - Clinical Statement Removed large piece of dry skin located on superior wound border and slough from central wound.  Overall much improved and now superficial with decreased drainage.  contienued with silver at this time as patient reports odor from stagnant drainage.  cleansed well prior to dressing and securing with medipore tape.     Hydrotherapy Plan Debridement;Dressing change;Patient/family education   Wound Therapy - Frequency --  2x/week for 6 weeks   Wound Therapy - Current Recommendations PT   Wound Plan Continue to debride and dressing change    Dressing  silveralginate followed by 4x4, abpad and tape; size 9 spandage around waist                   PT Short Term Goals - 06/04/16 1644      PT SHORT TERM GOAL #1   Title Pt to be wearing an abdominal binder to hold incision together    Time 1   Period Weeks   Status New     PT SHORT TERM GOAL #2   Title Wound drainage to be scant to reduce risk of infection an stop soiling clothing    Time 3   Period Weeks   Status New     PT SHORT TERM GOAL #3   Title wound to be 80% granulated to provide a healing enviornment.    Time 3   Period Weeks   Status New           PT Long Term Goals - 06/04/16 1646      PT LONG TERM GOAL #1   Title Wound to be 100% granulated to allow healing to occur   Time 6   Period Weeks   Status New     PT LONG TERM GOAL #2   Title no drainage so that clothes are not soiled   Time 6   Period Weeks   Status New     PT LONG TERM GOAL #3   Title Pt wound to be less  than 2 x .5 cm to allow pt to feel confident in self care.    Time 6   Period Weeks   Status New             Patient will benefit from skilled therapeutic intervention in order to improve the following deficits and impairments:  Visit Diagnosis: Unspecified open wound of abdominal wall, periumbilic region without penetration into peritoneal cavity, subsequent encounter     Problem List Patient Active Problem List   Diagnosis Date Noted  . Degeneration of lumbar or lumbosacral intervertebral disc 12/13/2014  . Anemia associated with nutritional deficiency 11/12/2014  . Bipolar affective disorder ([place]) 09/28/2014  . Anxiety state 09/28/2014  . S/P laparoscopic sleeve gastrectomy 01/2014 09/28/2014  . Abdominal wall seroma - chronic   . Seroma   . Cholelithiases 04/25/2013  . Cough 09/19/2011  . Smoker 09/19/2011  . GERD 03/04/2010  . RECTAL BLEEDING 03/04/2010  . DYSPHAGIA UNSPECIFIED 03/04/2010  . DYSPHAGIA 03/04/2010  . RUQ PAIN 03/04/2010    Teena Irani, PTA/CLT [phone]  06/15/2016, 5:38 PM  [place] [address] [place], [place], [address] Phone: [phone]   Fax:  [phone]  Name: [name] MRN: [id number] Date of Birth: 10-08-1975

## 2016-06-18 ENCOUNTER — Telehealth (HOSPITAL_COMMUNITY): Payer: Self-pay | Admitting: Physical Therapy

## 2016-06-18 ENCOUNTER — Ambulatory Visit (HOSPITAL_COMMUNITY): Payer: Medicare Other | Admitting: Physical Therapy

## 2016-06-18 NOTE — Telephone Encounter (Signed)
Patient loss track of time and had to drop her son off and forgot about her appt

## 2016-06-22 ENCOUNTER — Ambulatory Visit (HOSPITAL_COMMUNITY): Payer: Medicare Other | Admitting: Physical Therapy

## 2016-06-22 ENCOUNTER — Telehealth (HOSPITAL_COMMUNITY): Payer: Self-pay | Admitting: Physician Assistant

## 2016-06-22 NOTE — Telephone Encounter (Signed)
06/22/16 pt left a message to [facility] because she said that she had to keep her daughter's baby

## 2016-06-25 ENCOUNTER — Ambulatory Visit (HOSPITAL_COMMUNITY): Payer: Medicare Other | Attending: Physician Assistant | Admitting: Physical Therapy

## 2016-06-25 DIAGNOSIS — G4730 Sleep apnea, unspecified: Secondary | ICD-10-CM | POA: Diagnosis not present

## 2016-06-25 DIAGNOSIS — K219 Gastro-esophageal reflux disease without esophagitis: Secondary | ICD-10-CM | POA: Insufficient documentation

## 2016-06-25 DIAGNOSIS — M1990 Unspecified osteoarthritis, unspecified site: Secondary | ICD-10-CM | POA: Diagnosis not present

## 2016-06-25 DIAGNOSIS — X58XXXD Exposure to other specified factors, subsequent encounter: Secondary | ICD-10-CM | POA: Diagnosis not present

## 2016-06-25 DIAGNOSIS — F319 Bipolar disorder, unspecified: Secondary | ICD-10-CM | POA: Insufficient documentation

## 2016-06-25 DIAGNOSIS — S31105D Unspecified open wound of abdominal wall, periumbilic region without penetration into peritoneal cavity, subsequent encounter: Secondary | ICD-10-CM | POA: Diagnosis not present

## 2016-06-25 DIAGNOSIS — J45909 Unspecified asthma, uncomplicated: Secondary | ICD-10-CM | POA: Insufficient documentation

## 2016-06-25 NOTE — Therapy (Signed)
[place], [place], [address] Phone: [phone]   Fax:  [phone]  Wound Care Therapy  Patient Details  Name: [name] MRN: [id number] Date of Birth: Jan 25, 1976 Referring Provider: Dena Billet   Encounter Date: 06/25/2016      PT End of Session - 06/25/16 1015    Visit Number 5   Number of Visits 12   Date for PT Re-Evaluation 07/04/16   Authorization Type Medicare   Authorization - Visit Number 5   Authorization - Number of Visits 12   PT Start Time 0900   PT Stop Time 0925   PT Time Calculation (min) 25 min   Activity Tolerance Patient tolerated treatment well;No increased pain   Behavior During Therapy WFL for tasks assessed/performed      Past Medical History:  Diagnosis Date  . Anemia   . Anxiety   . Arthritis   . Asthma   . Bipolar affective ([place])   . Blood transfusion without reported diagnosis   . Colostomy in place [facility])   . Depression   . Diverticulitis 2008   perforated/ requiring resection  . Family history of adverse reaction to anesthesia    MGM was placed on vent after surgery  . GERD (gastroesophageal reflux disease)   . Headache   . Shortness of breath dyspnea   . Sleep apnea    mild, does not use c-pap machine  . Substance abuse    12 years ago-crack cocaine    Past Surgical History:  Procedure Laterality Date  . Abdominal wall hernia  07/2007   open repair with lysis of adhesions  . COLONOSCOPY  03/13/2010   MBW:[id number] hemorrhoids likely cause of hematochezia, otherwise normal  . Colostomy reversal  11/2006  . ESOPHAGOGASTRODUODENOSCOPY  03/13/2010   TSV:[id number] esophagus, status post passage of a Maloney dilator/Small hiatal hernia/ Antral erosions, status post biopsy  . EXAMINATION UNDER ANESTHESIA N/A 05/11/2012   Procedure: EXAM UNDER ANESTHESIA;  Surgeon: Donato Heinz, MD;  Location: [facility];  Service: General;  Laterality: N/A;  . Exploratory  laparotomy with resection  2008  . FINGER CLOSED REDUCTION Right 08/05/2012   Procedure: CLOSED REDUCTION RIGHT THUMB (FINGER);  Surgeon: Linna Hoff, MD;  Location: [facility];  Service: Orthopedics;  Laterality: Right;  . HERNIA REPAIR    . Hx of abd wall seroma  08/2007   Drained via [place] in [place], [place]  . Hx of abd wall seroma  10/2007   Drained by Dr. Geroge Baseman in office  . Kidney stones    . LAPAROSCOPIC GASTRIC SLEEVE RESECTION N/A 02/13/2014   Procedure: LAPAROSCOPIC GASTRIC SLEEVE RESECTION LYSIS OF ADHESIONS, UPPER ENDOSCOPY;  Surgeon: [name], MD;  [facility];  Service: General;  Laterality: N/A;  . LAPAROSCOPY N/A 09/28/2014   Procedure: LAPAROSCOPY DIAGNOSTIC, INCISION AND DRAINAGE WITH LAPAROSCOPIC EXPLORATION OF ABDOMINAL WALL SEROMA with ultrasound;  Surgeon: Greer Pickerel, MD;  [facility];  Service: General;  Laterality: N/A;  . PLACEMENT OF SETON N/A 05/11/2012   Procedure: PLACEMENT OF SETON;  Surgeon: Donato Heinz, MD;  Location: [facility];  Service: General;  Laterality: N/A;  . root canal 7-16    . TONSILLECTOMY    . TREATMENT FISTULA ANAL      There were no vitals filed for this visit.                  Wound Therapy - 06/25/16 7622  Subjective Pt has not been to woundcare in over 1 week due to transportation problems.  Pt states she has not changed her dressing and it smells really bad.   Patient and Family Stated Goals for the wound to heal    Date of Onset --  03/10/15   Prior Treatments self care, therapy    Pain Assessment No/denies pain   Evaluation and Treatment Procedures Explained to Patient/Family Yes   Evaluation and Treatment Procedures agreed to   Wound Properties Date First Assessed: 06/04/16 Time First Assessed: 1435 Wound Type: Other (Comment) , non-healing wound   Location: Abdomen Location Orientation: Distal;Mid Wound Description (Comments): abdomen just above umbilicus  Present on Admission: Yes   Dressing Type Silver  hydrofiber;Gauze (Comment);Abdominal pads   Dressing Changed Changed   Dressing Status Clean;Old drainage   Dressing Change Frequency PRN   Site / Wound Assessment Pale;Yellow;Granulation tissue   % Wound base Red or Granulating 40%   % Wound base Yellow/Fibrinous Exudate 60%   Treatment Cleansed;Debridement (Selective)   Selective Debridement - Location wound bed   Selective Debridement - Tools Used Scalpel   Selective Debridement - Tissue Removed slouth    Wound Therapy - Clinical Statement Wound with increased adheernet slough and with odor this session.  counceled patient on importance of keeping appointments and changing dressing at least every other day.  Pt given remaiing pack of dressing to use when needed.  Changed dressing to medihoney alginate to promote loosening of slough and increased lymphatic response.  suggested patient call MD to request refill of antibiotic as wound with odor.     Hydrotherapy Plan Debridement;Dressing change;Patient/family education   Wound Therapy - Frequency --  2x/week for 6 weeks   Wound Therapy - Current Recommendations PT   Wound Plan Continue to debride and dressing change.  follow up with MD contact regarding need for antibiotic and remeasure next session.   Dressing  medihoney alginate followed by 4x4, abpad and tape                   PT Short Term Goals - 06/04/16 1644      PT SHORT TERM GOAL #1   Title Pt to be wearing an abdominal binder to hold incision together    Time 1   Period Weeks   Status New     PT SHORT TERM GOAL #2   Title Wound drainage to be scant to reduce risk of infection an stop soiling clothing    Time 3   Period Weeks   Status New     PT SHORT TERM GOAL #3   Title wound to be 80% granulated to provide a healing enviornment.    Time 3   Period Weeks   Status New           PT Long Term Goals - 06/04/16 1646      PT LONG TERM GOAL #1   Title Wound to be 100% granulated to allow healing to  occur   Time 6   Period Weeks   Status New     PT LONG TERM GOAL #2   Title no drainage so that clothes are not soiled   Time 6   Period Weeks   Status New     PT LONG TERM GOAL #3   Title Pt wound to be less than 2 x .5 cm to allow pt to feel confident in self care.    Time 6   Period Weeks  Status New             Patient will benefit from skilled therapeutic intervention in order to improve the following deficits and impairments:     Visit Diagnosis: Unspecified open wound of abdominal wall, periumbilic region without penetration into peritoneal cavity, subsequent encounter     Problem List Patient Active Problem List   Diagnosis Date Noted  . Degeneration of lumbar or lumbosacral intervertebral disc 12/13/2014  . Anemia associated with nutritional deficiency 11/12/2014  . Bipolar affective disorder ([place]) 09/28/2014  . Anxiety state 09/28/2014  . S/P laparoscopic sleeve gastrectomy 01/2014 09/28/2014  . Abdominal wall seroma - chronic   . Seroma   . Cholelithiases 04/25/2013  . Cough 09/19/2011  . Smoker 09/19/2011  . GERD 03/04/2010  . RECTAL BLEEDING 03/04/2010  . DYSPHAGIA UNSPECIFIED 03/04/2010  . DYSPHAGIA 03/04/2010  . RUQ PAIN 03/04/2010    Teena Irani, PTA/CLT [phone]  06/25/2016, 10:17 AM  [facility] [address] [place], [place], [address] Phone: [phone]   Fax:  [phone]  Name: [name] MRN: [id number] Date of Birth: Dec 09, 1975

## 2016-06-29 ENCOUNTER — Ambulatory Visit (HOSPITAL_COMMUNITY): Payer: Medicare Other | Admitting: Physical Therapy

## 2016-06-29 DIAGNOSIS — G4730 Sleep apnea, unspecified: Secondary | ICD-10-CM | POA: Diagnosis not present

## 2016-06-29 DIAGNOSIS — K219 Gastro-esophageal reflux disease without esophagitis: Secondary | ICD-10-CM | POA: Diagnosis not present

## 2016-06-29 DIAGNOSIS — M1990 Unspecified osteoarthritis, unspecified site: Secondary | ICD-10-CM | POA: Diagnosis not present

## 2016-06-29 DIAGNOSIS — F319 Bipolar disorder, unspecified: Secondary | ICD-10-CM | POA: Diagnosis not present

## 2016-06-29 DIAGNOSIS — S31105D Unspecified open wound of abdominal wall, periumbilic region without penetration into peritoneal cavity, subsequent encounter: Secondary | ICD-10-CM

## 2016-06-29 DIAGNOSIS — J45909 Unspecified asthma, uncomplicated: Secondary | ICD-10-CM | POA: Diagnosis not present

## 2016-06-29 NOTE — Therapy (Signed)
[place], [place], [address] Phone: [phone]   Fax:  [phone]  Wound Care Therapy  Patient Details  Name: [name] MRN: [id number] Date of Birth: [birth year] Referring Provider: Dena Billet   Encounter Date: 06/29/2016      PT End of Session - 06/29/16 1612    Visit Number 6   Number of Visits 12   Date for PT Re-Evaluation 07/04/16   Authorization Type Medicare   Authorization - Visit Number 6   Authorization - Number of Visits 12   PT Start Time 0900   PT Stop Time 0915   PT Time Calculation (min) 15 min   Activity Tolerance Patient tolerated treatment well;No increased pain   Behavior During Therapy WFL for tasks assessed/performed      Past Medical History:  Diagnosis Date  . Anemia   . Anxiety   . Arthritis   . Asthma   . Bipolar affective ([place])   . Blood transfusion without reported diagnosis   . Colostomy in place [facility])   . Depression   . Diverticulitis 2008   perforated/ requiring resection  . Family history of adverse reaction to anesthesia    MGM was placed on vent after surgery  . GERD (gastroesophageal reflux disease)   . Headache   . Shortness of breath dyspnea   . Sleep apnea    mild, does not use c-pap machine  . Substance abuse    12 years ago-crack cocaine    Past Surgical History:  Procedure Laterality Date  . Abdominal wall hernia  07/2007   open repair with lysis of adhesions  . COLONOSCOPY  03/13/2010   OFB:PZWCHENI hemorrhoids likely cause of hematochezia, otherwise normal  . Colostomy reversal  11/2006  . ESOPHAGOGASTRODUODENOSCOPY  03/13/2010   DPO:[id number] esophagus, status post passage of a Maloney dilator/Small hiatal hernia/ Antral erosions, status post biopsy  . EXAMINATION UNDER ANESTHESIA N/A 05/11/2012   Procedure: EXAM UNDER ANESTHESIA;  Surgeon: Donato Heinz, MD;  [facility];  Service: General;  Laterality: N/A;  . Exploratory  laparotomy with resection  2008  . FINGER CLOSED REDUCTION Right 08/05/2012   Procedure: CLOSED REDUCTION RIGHT THUMB (FINGER);  Surgeon: Linna Hoff, MD;  Location: [facility];  Service: Orthopedics;  Laterality: Right;  . HERNIA REPAIR    . Hx of abd wall seroma  08/2007   Drained via [place] in [place], [place]  . Hx of abd wall seroma  10/2007   Drained by Dr. Geroge Baseman in office  . Kidney stones    . LAPAROSCOPIC GASTRIC SLEEVE RESECTION N/A 02/13/2014   Procedure: LAPAROSCOPIC GASTRIC SLEEVE RESECTION LYSIS OF ADHESIONS, UPPER ENDOSCOPY;  Surgeon: [name], MD;  [facility];  Service: General;  Laterality: N/A;  . LAPAROSCOPY N/A 09/28/2014   Procedure: LAPAROSCOPY DIAGNOSTIC, INCISION AND DRAINAGE WITH LAPAROSCOPIC EXPLORATION OF ABDOMINAL WALL SEROMA with ultrasound;  Surgeon: Greer Pickerel, MD;  [facility];  Service: General;  Laterality: N/A;  . PLACEMENT OF SETON N/A 05/11/2012   Procedure: PLACEMENT OF SETON;  Surgeon: Donato Heinz, MD;  [facility];  Service: General;  Laterality: N/A;  . root canal 7-16    . TONSILLECTOMY    . TREATMENT FISTULA ANAL      There were no vitals filed for this visit.                  Wound Therapy - 06/29/16 1611  Subjective Pt reports she thinks it's alot better   Patient and Family Stated Goals for the wound to heal    Date of Onset --  03/10/15   Prior Treatments self care, therapy    Pain Assessment No/denies pain   Evaluation and Treatment Procedures Explained to Patient/Family Yes   Evaluation and Treatment Procedures agreed to   Wound Properties Date First Assessed: 06/04/16 Time First Assessed: 1435 Wound Type: Other (Comment) , non-healing wound   Location: Abdomen Location Orientation: Distal;Mid Wound Description (Comments): abdomen just above umbilicus  Present on Admission: Yes   Dressing Type Silver hydrofiber;Gauze (Comment);Abdominal pads   Dressing Status Clean;Old drainage   Dressing Change Frequency  PRN   Site / Wound Assessment Pale;Yellow;Granulation tissue   % Wound base Red or Granulating 85%   % Wound base Yellow/Fibrinous Exudate 15%   Treatment Cleansed;Debridement (Selective)   Selective Debridement - Location wound bed   Selective Debridement - Tools Used Scalpel   Selective Debridement - Tissue Removed slouth    Wound Therapy - Clinical Statement much improved with increased granulation.  Removed slough from edges to improve approximation.  Honey seems to be working well in healing wound.     Hydrotherapy Plan Debridement;Dressing change;Patient/family education   Wound Therapy - Frequency --  2x/week for 6 weeks   Wound Therapy - Current Recommendations PT   Wound Plan Continue to debride and dressing change using medihoney.  measure wound next session.    Dressing  medihoney alginate followed by 4x4, abpad and tape                   PT Short Term Goals - 06/04/16 1644      PT SHORT TERM GOAL #1   Title Pt to be wearing an abdominal binder to hold incision together    Time 1   Period Weeks   Status New     PT SHORT TERM GOAL #2   Title Wound drainage to be scant to reduce risk of infection an stop soiling clothing    Time 3   Period Weeks   Status New     PT SHORT TERM GOAL #3   Title wound to be 80% granulated to provide a healing enviornment.    Time 3   Period Weeks   Status New           PT Long Term Goals - 06/04/16 1646      PT LONG TERM GOAL #1   Title Wound to be 100% granulated to allow healing to occur   Time 6   Period Weeks   Status New     PT LONG TERM GOAL #2   Title no drainage so that clothes are not soiled   Time 6   Period Weeks   Status New     PT LONG TERM GOAL #3   Title Pt wound to be less than 2 x .5 cm to allow pt to feel confident in self care.    Time 6   Period Weeks   Status New             Patient will benefit from skilled therapeutic intervention in order to improve the following deficits  and impairments:     Visit Diagnosis: Unspecified open wound of abdominal wall, periumbilic region without penetration into peritoneal cavity, subsequent encounter     Problem List Patient Active Problem List   Diagnosis Date Noted  . Degeneration of lumbar or lumbosacral  intervertebral disc 12/13/2014  . Anemia associated with nutritional deficiency 11/12/2014  . Bipolar affective disorder ([place]) 09/28/2014  . Anxiety state 09/28/2014  . S/P laparoscopic sleeve gastrectomy 01/2014 09/28/2014  . Abdominal wall seroma - chronic   . Seroma   . Cholelithiases 04/25/2013  . Cough 09/19/2011  . Smoker 09/19/2011  . GERD 03/04/2010  . RECTAL BLEEDING 03/04/2010  . DYSPHAGIA UNSPECIFIED 03/04/2010  . DYSPHAGIA 03/04/2010  . RUQ PAIN 03/04/2010    Teena Irani, PTA/CLT [phone]  06/29/2016, 4:13 PM  [facility] [address] [place], [place], [address] Phone: [phone]   Fax:  [phone]  Name: [name] MRN: [id number] Date of Birth: [birth year]

## 2016-07-01 DIAGNOSIS — F319 Bipolar disorder, unspecified: Secondary | ICD-10-CM | POA: Diagnosis not present

## 2016-07-02 ENCOUNTER — Ambulatory Visit (HOSPITAL_COMMUNITY): Payer: Medicare Other | Admitting: Physical Therapy

## 2016-07-02 DIAGNOSIS — G4730 Sleep apnea, unspecified: Secondary | ICD-10-CM | POA: Diagnosis not present

## 2016-07-02 DIAGNOSIS — K219 Gastro-esophageal reflux disease without esophagitis: Secondary | ICD-10-CM | POA: Diagnosis not present

## 2016-07-02 DIAGNOSIS — F319 Bipolar disorder, unspecified: Secondary | ICD-10-CM | POA: Diagnosis not present

## 2016-07-02 DIAGNOSIS — J45909 Unspecified asthma, uncomplicated: Secondary | ICD-10-CM | POA: Diagnosis not present

## 2016-07-02 DIAGNOSIS — S31105D Unspecified open wound of abdominal wall, periumbilic region without penetration into peritoneal cavity, subsequent encounter: Secondary | ICD-10-CM | POA: Diagnosis not present

## 2016-07-02 DIAGNOSIS — M1990 Unspecified osteoarthritis, unspecified site: Secondary | ICD-10-CM | POA: Diagnosis not present

## 2016-07-02 NOTE — Therapy (Signed)
[facility] [place], [place], [address] Phone: [phone]   Fax:  [phone]  Wound Care Therapy  Patient Details  Name: [name] MRN: [id number] Date of Birth: September 14, 1975 Referring Provider: Dena Billet   Encounter Date: 07/02/2016      PT End of Session - 07/02/16 1549    Visit Number 7   Number of Visits 12   Date for PT Re-Evaluation 07/04/16   Authorization Type Medicare   Authorization - Visit Number 7   Authorization - Number of Visits 12   PT Start Time 0902   PT Stop Time 0920   PT Time Calculation (min) 18 min   Activity Tolerance Patient tolerated treatment well;No increased pain   Behavior During Therapy WFL for tasks assessed/performed      Past Medical History:  Diagnosis Date  . Anemia   . Anxiety   . Arthritis   . Asthma   . Bipolar affective ([place])   . Blood transfusion without reported diagnosis   . Colostomy in place [facility]   . Depression   . Diverticulitis 2008   perforated/ requiring resection  . Family history of adverse reaction to anesthesia    MGM was placed on vent after surgery  . GERD (gastroesophageal reflux disease)   . Headache   . Shortness of breath dyspnea   . Sleep apnea    mild, does not use c-pap machine  . Substance abuse    12 years ago-crack cocaine    Past Surgical History:  Procedure Laterality Date  . Abdominal wall hernia  07/2007   open repair with lysis of adhesions  . COLONOSCOPY  03/13/2010   YFV:CBSWHQPR hemorrhoids likely cause of hematochezia, otherwise normal  . Colostomy reversal  11/2006  . ESOPHAGOGASTRODUODENOSCOPY  03/13/2010   FFM:[id number] esophagus, status post passage of a Maloney dilator/Small hiatal hernia/ Antral erosions, status post biopsy  . EXAMINATION UNDER ANESTHESIA N/A 05/11/2012   Procedure: EXAM UNDER ANESTHESIA;  Surgeon: Donato Heinz, MD;  [facility];  Service: General;  Laterality: N/A;  . Exploratory  laparotomy with resection  2008  . FINGER CLOSED REDUCTION Right 08/05/2012   Procedure: CLOSED REDUCTION RIGHT THUMB (FINGER);  Surgeon: Linna Hoff, MD;  Location: [facility];  Service: Orthopedics;  Laterality: Right;  . HERNIA REPAIR    . Hx of abd wall seroma  08/2007   Drained via [place] in [place], [place]  . Hx of abd wall seroma  10/2007   Drained by Dr. Geroge Baseman in office  . Kidney stones    . LAPAROSCOPIC GASTRIC SLEEVE RESECTION N/A 02/13/2014   Procedure: LAPAROSCOPIC GASTRIC SLEEVE RESECTION LYSIS OF ADHESIONS, UPPER ENDOSCOPY;  Surgeon: [name], MD;  [facility];  Service: General;  Laterality: N/A;  . LAPAROSCOPY N/A 09/28/2014   Procedure: LAPAROSCOPY DIAGNOSTIC, INCISION AND DRAINAGE WITH LAPAROSCOPIC EXPLORATION OF ABDOMINAL WALL SEROMA with ultrasound;  Surgeon: Greer Pickerel, MD;  [facility];  Service: General;  Laterality: N/A;  . PLACEMENT OF SETON N/A 05/11/2012   Procedure: PLACEMENT OF SETON;  Surgeon: Donato Heinz, MD;  [facility];  Service: General;  Laterality: N/A;  . root canal 7-16    . TONSILLECTOMY    . TREATMENT FISTULA ANAL      There were no vitals filed for this visit.                  Wound Therapy - 07/02/16 1545  Subjective Pt states she wants to follow therapy until it is completly healed.  States it is getting better.     Patient and Family Stated Goals for the wound to heal    Date of Onset --  03/10/15   Prior Treatments self care, therapy    Pain Assessment No/denies pain   Evaluation and Treatment Procedures Explained to Patient/Family Yes   Evaluation and Treatment Procedures agreed to   Wound Properties Date First Assessed: 06/04/16 Time First Assessed: 1435 Wound Type: Other (Comment) , non-healing wound   Location: Abdomen Location Orientation: Distal;Mid Wound Description (Comments): abdomen just above umbilicus  Present on Admission: Yes   Dressing Type Silver hydrofiber;Gauze (Comment);Abdominal pads    Dressing Status Clean;Old drainage   Dressing Change Frequency PRN   Site / Wound Assessment Pale;Yellow;Granulation tissue   % Wound base Red or Granulating 90%   % Wound base Yellow/Fibrinous Exudate 10%   Wound Length (cm) 2 cm  was 6 cm on 4/12   Wound Width (cm) 0.5 cm  was 1.7 cm on 4/12   Wound Depth (cm) 0.1 cm  was not recorded previously   Treatment Cleansed;Debridement (Selective)   Selective Debridement - Location wound bed   Selective Debridement - Tools Used Scalpel   Selective Debridement - Tissue Removed slouth    Wound Therapy - Clinical Statement Remeasured this session with continued improvment and reduction in size since 4/12.  Medihoney alginate.  Pt will require further skilled care to debride slough and continue approximation of wound borders.     Hydrotherapy Plan Debridement;Dressing change;Patient/family education   Wound Therapy - Frequency --  2x/week for 6 weeks   Wound Therapy - Current Recommendations PT   Wound Plan Continue to debride and dressing change using dressings conducive to healing wound environment.    Dressing  medihoney alginate followed by 4x4, abpad and tape                   PT Short Term Goals - 06/04/16 1644      PT SHORT TERM GOAL #1   Title Pt to be wearing an abdominal binder to hold incision together    Time 1   Period Weeks   Status New     PT SHORT TERM GOAL #2   Title Wound drainage to be scant to reduce risk of infection an stop soiling clothing    Time 3   Period Weeks   Status New     PT SHORT TERM GOAL #3   Title wound to be 80% granulated to provide a healing enviornment.    Time 3   Period Weeks   Status New           PT Long Term Goals - 06/04/16 1646      PT LONG TERM GOAL #1   Title Wound to be 100% granulated to allow healing to occur   Time 6   Period Weeks   Status New     PT LONG TERM GOAL #2   Title no drainage so that clothes are not soiled   Time 6   Period Weeks   Status  New     PT LONG TERM GOAL #3   Title Pt wound to be less than 2 x .5 cm to allow pt to feel confident in self care.    Time 6   Period Weeks   Status New  Patient will benefit from skilled therapeutic intervention in order to improve the following deficits and impairments:     Visit Diagnosis: Unspecified open wound of abdominal wall, periumbilic region without penetration into peritoneal cavity, subsequent encounter     Problem List Patient Active Problem List   Diagnosis Date Noted  . Degeneration of lumbar or lumbosacral intervertebral disc 12/13/2014  . Anemia associated with nutritional deficiency 11/12/2014  . Bipolar affective disorder ([place]) 09/28/2014  . Anxiety state 09/28/2014  . S/P laparoscopic sleeve gastrectomy 01/2014 09/28/2014  . Abdominal wall seroma - chronic   . Seroma   . Cholelithiases 04/25/2013  . Cough 09/19/2011  . Smoker 09/19/2011  . GERD 03/04/2010  . RECTAL BLEEDING 03/04/2010  . DYSPHAGIA UNSPECIFIED 03/04/2010  . DYSPHAGIA 03/04/2010  . RUQ PAIN 03/04/2010    Teena Irani, PTA/CLT [place], PT CLT [phone] 07/02/2016, 3:50 PM  [facility] [address] [place], [place], [address] Phone: [phone]   Fax:  [phone]  Name: [name] MRN: [id number] Date of Birth: 02/23/1976

## 2016-07-06 ENCOUNTER — Ambulatory Visit (HOSPITAL_COMMUNITY): Payer: Medicare Other | Admitting: Physical Therapy

## 2016-07-06 DIAGNOSIS — K219 Gastro-esophageal reflux disease without esophagitis: Secondary | ICD-10-CM | POA: Diagnosis not present

## 2016-07-06 DIAGNOSIS — G4730 Sleep apnea, unspecified: Secondary | ICD-10-CM | POA: Diagnosis not present

## 2016-07-06 DIAGNOSIS — S31105D Unspecified open wound of abdominal wall, periumbilic region without penetration into peritoneal cavity, subsequent encounter: Secondary | ICD-10-CM | POA: Diagnosis not present

## 2016-07-06 DIAGNOSIS — M1990 Unspecified osteoarthritis, unspecified site: Secondary | ICD-10-CM | POA: Diagnosis not present

## 2016-07-06 DIAGNOSIS — J45909 Unspecified asthma, uncomplicated: Secondary | ICD-10-CM | POA: Diagnosis not present

## 2016-07-06 DIAGNOSIS — F319 Bipolar disorder, unspecified: Secondary | ICD-10-CM | POA: Diagnosis not present

## 2016-07-06 NOTE — Therapy (Addendum)
[place], [place], [address] Phone: [phone]   Fax:  [phone]  Wound Care Therapy/Discharge   Patient Details  Name: [name] MRN: [id number] Date of Birth: 08-Apr-1975 Referring Provider: Dena Billet   Encounter Date: 07/06/2016      PT End of Session - 07/06/16 1023    Visit Number 8   Number of Visits 12   Date for PT Re-Evaluation 07/04/16   Authorization Type Medicare   Authorization - Visit Number 8   Authorization - Number of Visits 12   PT Start Time 0850   PT Stop Time 0905   PT Time Calculation (min) 15 min   Activity Tolerance Patient tolerated treatment well;No increased pain   Behavior During Therapy WFL for tasks assessed/performed      Past Medical History:  Diagnosis Date  . Anemia   . Anxiety   . Arthritis   . Asthma   . Bipolar affective ([place])   . Blood transfusion without reported diagnosis   . Colostomy in place [facility])   . Depression   . Diverticulitis 2008   perforated/ requiring resection  . Family history of adverse reaction to anesthesia    MGM was placed on vent after surgery  . GERD (gastroesophageal reflux disease)   . Headache   . Shortness of breath dyspnea   . Sleep apnea    mild, does not use c-pap machine  . Substance abuse    12 years ago-crack cocaine    Past Surgical History:  Procedure Laterality Date  . Abdominal wall hernia  07/2007   open repair with lysis of adhesions  . COLONOSCOPY  03/13/2010   BPZ:WCHENIDP hemorrhoids likely cause of hematochezia, otherwise normal  . Colostomy reversal  11/2006  . ESOPHAGOGASTRODUODENOSCOPY  03/13/2010   OEU:MPNTIR-WERXVQMGQ esophagus, status post passage of a Maloney dilator/Small hiatal hernia/ Antral erosions, status post biopsy  . EXAMINATION UNDER ANESTHESIA N/A 05/11/2012   Procedure: EXAM UNDER ANESTHESIA;  Surgeon: Donato Heinz, MD;  Location: [facility];  Service: General;  Laterality: N/A;  .  Exploratory laparotomy with resection  2008  . FINGER CLOSED REDUCTION Right 08/05/2012   Procedure: CLOSED REDUCTION RIGHT THUMB (FINGER);  Surgeon: Linna Hoff, MD;  Location: [facility];  Service: Orthopedics;  Laterality: Right;  . HERNIA REPAIR    . Hx of abd wall seroma  08/2007   Drained via [place] in [place], [place]  . Hx of abd wall seroma  10/2007   Drained by Dr. Geroge Baseman in office  . Kidney stones    . LAPAROSCOPIC GASTRIC SLEEVE RESECTION N/A 02/13/2014   Procedure: LAPAROSCOPIC GASTRIC SLEEVE RESECTION LYSIS OF ADHESIONS, UPPER ENDOSCOPY;  Surgeon: [name], MD;  Location: [facility];  Service: General;  Laterality: N/A;  . LAPAROSCOPY N/A 09/28/2014   Procedure: LAPAROSCOPY DIAGNOSTIC, INCISION AND DRAINAGE WITH LAPAROSCOPIC EXPLORATION OF ABDOMINAL WALL SEROMA with ultrasound;  Surgeon: Greer Pickerel, MD;  Location: [facility];  Service: General;  Laterality: N/A;  . PLACEMENT OF SETON N/A 05/11/2012   Procedure: PLACEMENT OF SETON;  Surgeon: Donato Heinz, MD;  Location: [facility];  Service: General;  Laterality: N/A;  . root canal 7-16    . TONSILLECTOMY    . TREATMENT FISTULA ANAL      There were no vitals filed for this visit.                  Wound Therapy - 07/06/16 1009  Subjective PT states she knows that she is almost healed but she really wants to have treatment until the wound is totally healed due to having so much complications with this wound    Patient and Family Stated Goals for the wound to heal    Date of Onset --  03/10/15   Prior Treatments self care, therapy    Pain Assessment No/denies pain   Evaluation and Treatment Procedures Explained to Patient/Family Yes   Evaluation and Treatment Procedures agreed to   Wound Properties Date First Assessed: 06/04/16 Time First Assessed: 1435 Wound Type: Other (Comment) , non-healing wound   Location: Abdomen Location Orientation: Distal;Mid Wound Description (Comments): abdomen just above umbilicus  Present on  Admission: Yes   Dressing Type Abdominal pads;Gauze (Comment);Other (Comment)  medihoney alginate   Dressing Status Clean;Old drainage   Dressing Change Frequency PRN   Site / Wound Assessment Pale;Yellow;Granulation tissue   % Wound base Red or Granulating 100%   % Wound base Yellow/Fibrinous Exudate 0%   Treatment Cleansed;Other (Comment)  selft care    Wound Therapy - Clinical Statement Pt encouraged to wear compression for a full month after closure of wound to make sure that it stays closted this time.  Pt agrees and is quite happy that she has lost 10 lbs.   Wound is closing fast and pt should be ready for discharge in the next one to two treatments.      Hydrotherapy Plan Debridement;Dressing change;Other (comment)   Wound Therapy - Frequency --  2x/week for 6 weeks   Wound Therapy - Current Recommendations PT   Wound Plan Continue to debride if needed  and dressing change using dressings conducive to healing wound environment.    Dressing  medihoney alginate followed by 4x4, abpad and tape                 PT Education - 07/06/16 1022    Education provided Yes   Education Details The importance of using compression to keep her abdominal area from pulling while her wound heals and for a month after to allow scar tissue to develop properly    Person(s) Educated Patient   Methods Explanation          PT Short Term Goals - 06/04/16 1644      PT SHORT TERM GOAL #1   Title Pt to be wearing an abdominal binder to hold incision together    Time 1   Period Weeks   Status New     PT SHORT TERM GOAL #2   Title Wound drainage to be scant to reduce risk of infection an stop soiling clothing    Time 3   Period Weeks   Status New     PT SHORT TERM GOAL #3   Title wound to be 80% granulated to provide a healing enviornment.    Time 3   Period Weeks   Status New           PT Long Term Goals - 06/04/16 1646      PT LONG TERM GOAL #1   Title Wound to be 100%  granulated to allow healing to occur   Time 6   Period Weeks   Status New     PT LONG TERM GOAL #2   Title no drainage so that clothes are not soiled   Time 6   Period Weeks   Status New     PT LONG TERM GOAL #3   Title  Pt wound to be less than 2 x .5 cm to allow pt to feel confident in self care.    Time 6   Period Weeks   Status New       [id number]  CL [id number]  CI        Plan - 07/06/16 1024    Clinical Impression Statement see above    Rehab Potential Good   PT Frequency 2x / week   PT Duration 6 weeks   PT Treatment/Interventions Other (comment);Patient/family education  debridement    PT Next Visit Plan as above       Patient will benefit from skilled therapeutic intervention in order to improve the following deficits and impairments:  Other (comment) (nonhealing wound )  Visit Diagnosis: Unspecified open wound of abdominal wall, periumbilic region without penetration into peritoneal cavity, subsequent encounter     Problem List Patient Active Problem List   Diagnosis Date Noted  . Degeneration of lumbar or lumbosacral intervertebral disc 12/13/2014  . Anemia associated with nutritional deficiency 11/12/2014  . Bipolar affective disorder ([place]) 09/28/2014  . Anxiety state 09/28/2014  . S/P laparoscopic sleeve gastrectomy 01/2014 09/28/2014  . Abdominal wall seroma - chronic   . Seroma   . Cholelithiases 04/25/2013  . Cough 09/19/2011  . Smoker 09/19/2011  . GERD 03/04/2010  . RECTAL BLEEDING 03/04/2010  . DYSPHAGIA UNSPECIFIED 03/04/2010  . DYSPHAGIA 03/04/2010  . RUQ PAIN 03/04/2010    Rayetta Humphrey, PT CLT [phone] 07/06/2016, 10:25 AM  [place] [address] [place], [place], [address] Phone: [phone]   Fax:  [phone]  Name: ALEKSANDRA RABEN MRN: [id number] Date of Birth: [birth year]  PHYSICAL THERAPY DISCHARGE SUMMARY  Visits from Start of Care: 8  Current functional level related  to goals / functional outcomes: See above pt has not returned for further treatment in 2 months   Remaining deficits: See above pt has not returned for further treatment in 2 months      Education / Equipment: See above  Plan: Patient agrees to discharge.  Patient goals were partially met. Patient is being discharged due to not returning since the last visit.  ?????       Rayetta Humphrey, [place] CLT [phone]

## 2016-07-09 ENCOUNTER — Ambulatory Visit (HOSPITAL_COMMUNITY): Payer: Medicare Other | Admitting: Physical Therapy

## 2016-07-09 ENCOUNTER — Telehealth (HOSPITAL_COMMUNITY): Payer: Self-pay | Admitting: Physician Assistant

## 2016-07-09 NOTE — Telephone Encounter (Signed)
07/09/16  Pt left a message to cx but no reason was given

## 2016-07-10 DIAGNOSIS — M170 Bilateral primary osteoarthritis of knee: Secondary | ICD-10-CM | POA: Diagnosis not present

## 2016-07-10 DIAGNOSIS — G894 Chronic pain syndrome: Secondary | ICD-10-CM | POA: Diagnosis not present

## 2016-07-10 DIAGNOSIS — M47817 Spondylosis without myelopathy or radiculopathy, lumbosacral region: Secondary | ICD-10-CM | POA: Diagnosis not present

## 2016-07-10 DIAGNOSIS — M5417 Radiculopathy, lumbosacral region: Secondary | ICD-10-CM | POA: Diagnosis not present

## 2016-07-14 ENCOUNTER — Ambulatory Visit (HOSPITAL_COMMUNITY): Payer: Medicare Other | Admitting: Physical Therapy

## 2016-07-14 NOTE — Telephone Encounter (Signed)
Called pt re: missed appointment.  No answer.  Left a message that therapist is concerned that her next appointment is not until 07/23/16 and pt should call to attempt to reschedule appointment.  Rayetta Humphrey, [place] CLT [phone]

## 2016-07-22 ENCOUNTER — Telehealth (HOSPITAL_COMMUNITY): Payer: Self-pay | Admitting: Physical Therapy

## 2016-07-22 NOTE — Telephone Encounter (Signed)
Pt requested to be put on hold and would like a call in the middle of June to see how things are going.

## 2016-07-23 ENCOUNTER — Ambulatory Visit (HOSPITAL_COMMUNITY): Payer: Medicare Other

## 2016-07-28 ENCOUNTER — Ambulatory Visit (HOSPITAL_COMMUNITY): Payer: Medicare Other | Admitting: Physical Therapy

## 2016-07-30 ENCOUNTER — Ambulatory Visit (HOSPITAL_COMMUNITY): Payer: Medicare Other | Admitting: Physical Therapy

## 2016-08-04 ENCOUNTER — Ambulatory Visit (HOSPITAL_COMMUNITY): Payer: Medicare Other | Admitting: Physical Therapy

## 2016-08-06 ENCOUNTER — Ambulatory Visit (HOSPITAL_COMMUNITY): Payer: Medicare Other | Admitting: Physical Therapy

## 2016-09-02 ENCOUNTER — Telehealth (HOSPITAL_COMMUNITY): Payer: Self-pay | Admitting: Physical Therapy

## 2016-09-02 NOTE — Telephone Encounter (Signed)
she will need a new referral from the MD before we can schedule her since her last visit was over 30 days ago. NF

## 2016-09-09 DIAGNOSIS — F319 Bipolar disorder, unspecified: Secondary | ICD-10-CM | POA: Diagnosis not present

## 2016-09-15 ENCOUNTER — Encounter (HOSPITAL_COMMUNITY): Payer: Self-pay

## 2016-11-01 ENCOUNTER — Other Ambulatory Visit: Payer: Self-pay | Admitting: Physician Assistant

## 2016-11-19 ENCOUNTER — Encounter: Payer: Self-pay | Admitting: Physician Assistant

## 2016-11-19 ENCOUNTER — Ambulatory Visit: Payer: Medicare Other | Admitting: Physician Assistant

## 2016-12-16 DIAGNOSIS — F319 Bipolar disorder, unspecified: Secondary | ICD-10-CM | POA: Diagnosis not present

## 2016-12-28 ENCOUNTER — Other Ambulatory Visit: Payer: Self-pay | Admitting: Physician Assistant

## 2016-12-30 ENCOUNTER — Ambulatory Visit: Payer: Medicare Other | Admitting: Physician Assistant

## 2017-01-06 ENCOUNTER — Other Ambulatory Visit: Payer: Self-pay

## 2017-01-06 ENCOUNTER — Ambulatory Visit (INDEPENDENT_AMBULATORY_CARE_PROVIDER_SITE_OTHER): Payer: Medicare Other | Admitting: Physician Assistant

## 2017-01-06 ENCOUNTER — Encounter: Payer: Self-pay | Admitting: Physician Assistant

## 2017-01-06 ENCOUNTER — Ambulatory Visit: Payer: Medicare Other | Admitting: Physician Assistant

## 2017-01-06 VITALS — BP 122/84 | HR 91 | Temp 97.6°F | Resp 16 | Wt 250.4 lb

## 2017-01-06 DIAGNOSIS — Z9884 Bariatric surgery status: Secondary | ICD-10-CM | POA: Diagnosis not present

## 2017-01-06 DIAGNOSIS — L659 Nonscarring hair loss, unspecified: Secondary | ICD-10-CM

## 2017-01-06 DIAGNOSIS — N912 Amenorrhea, unspecified: Secondary | ICD-10-CM | POA: Diagnosis not present

## 2017-01-06 DIAGNOSIS — D539 Nutritional anemia, unspecified: Secondary | ICD-10-CM

## 2017-01-06 DIAGNOSIS — Z23 Encounter for immunization: Secondary | ICD-10-CM | POA: Diagnosis not present

## 2017-01-06 NOTE — Addendum Note (Signed)
Addended by: Vonna Kotyk A on: 01/06/2017 04:55 PM   Modules accepted: Orders

## 2017-01-06 NOTE — Progress Notes (Signed)
Patient ID: [name] MRN: [id number], DOB: 01/07/1976, 41 y.o. Date of Encounter: @DATE @  Chief Complaint:  Chief Complaint  Patient presents with  . no menstrual cycle  . hair falling out    HPI: 41 y.o. year old female  presents with above.   She reports that she has not been sexually active for 8 years.  She states that in the past, she had seen GYN and they had prescribed oral contraceptives to regulate her periods.  Prior to that, she would have bleeding for almost the entire month.  States that she quit taking the pills about 4 months ago.  Has had no menstrual bleeding for about 6 months.  Also has noticed more hair coming out than usual.  Hair is very thin towards the front of her scalp.  Also asks if I can recheck her blood count.  Says that she saw Dr. Beryle Beams about this in the past.  I reviewed his note and lab results from 11/12/2014.   She has had a gastric sleeve procedure in 2015 secondary to severe obesity.   At the time of the hematology evaluation in 2016 she had normal O96 and folic acid levels.   She had iron deficiency but had good response to oral iron replacement and had good iron stores.   He saw no evidence for malabsorption at that time.   At that time he advised to continue oral iron.  Note at that time states that "she may develop malabsorption over time that he would recommend checking blood counts 2 or 3 times per year specifically CBC and ferritin to assess iron stores.  If iron stores become depleted while on oral iron then she would be a candidate for parenteral iron."   Past Medical History:  Diagnosis Date  . Anemia   . Anxiety   . Arthritis   . Asthma   . Bipolar affective ([place])   . Blood transfusion without reported diagnosis   . Colostomy in place [facility])   . Depression   . Diverticulitis 2008   perforated/ requiring resection  . Family history of adverse reaction to anesthesia    MGM was placed on vent after surgery  . GERD  (gastroesophageal reflux disease)   . Headache   . Shortness of breath dyspnea   . Sleep apnea    mild, does not use c-pap machine  . Substance abuse ([place])    12 years ago-crack cocaine     Home Meds: Outpatient Medications Prior to Visit  Medication Sig Dispense Refill  . Calcium Carbonate-Vitamin D (CALCIUM + D PO) Take 1 tablet by mouth daily.    . clindamycin (CLINDAGEL) 1 % gel Apply topically 2 (two) times daily. 30 g 1  . ferrous sulfate 325 (65 FE) MG tablet Take 650 mg by mouth 2 (two) times daily.     . haloperidol (HALDOL) 5 MG tablet 1 tab in AM and 3 tab at HS    . lisdexamfetamine (VYVANSE) 60 MG capsule Take 60 mg daily by mouth.    Marland Kitchen LORazepam (ATIVAN) 1 MG tablet Take 1 mg 3 (three) times daily by mouth.    . Multiple Vitamins-Minerals (HAIR/SKIN/NAILS/BIOTIN) TABS Take 1 tablet by mouth 2 (two) times daily.    . norgestimate-ethinyl estradiol (ORTHO-CYCLEN,SPRINTEC,PREVIFEM) 0.25-35 MG-MCG tablet Take 1 tablet by mouth daily.    Marland Kitchen NUVIGIL 250 MG tablet Take 250 mg by mouth daily.  5  . ondansetron (ZOFRAN ODT) 4 MG disintegrating tablet Take  1 tablet (4 mg total) by mouth every 8 (eight) hours as needed for nausea or vomiting. 40 tablet 0  . QUEtiapine (SEROQUEL) 300 MG tablet 2 hs    . valACYclovir (VALTREX) 500 MG tablet TAKE 1 TABLET (500 MG TOTAL) BY MOUTH DAILY AS NEEDED (FEVER BLISTER). 30 tablet 3  . zolpidem (AMBIEN) 10 MG tablet Take 10 mg by mouth at bedtime.     Marland Kitchen amphetamine-dextroamphetamine (ADDERALL XR) 30 MG 24 hr capsule Take 30 mg by mouth daily.    Marland Kitchen BUTRANS 15 MCG/HR PTWK APPLY 1 PATCH TOPICALLY ONCE A WEEK  0  . clonazePAM (KLONOPIN) 1 MG tablet Take 1 mg by mouth 2 (two) times daily.     . meloxicam (MOBIC) 7.5 MG tablet TAKE 1 TO 2 TABLETS BY MOUTH EVERY DAY AS NEEDED PAIN  1  . oseltamivir (TAMIFLU) 75 MG capsule Take 1 capsule (75 mg total) by mouth daily. 10 capsule 0  . sulfamethoxazole-trimethoprim (BACTRIM DS,SEPTRA DS) 800-160 MG tablet  Take 1 tablet by mouth 2 (two) times daily. 20 tablet 0   No facility-administered medications prior to visit.     Allergies:  Allergies  Allergen Reactions  . Nsaids   . Tylenol [Acetaminophen] Nausea And Vomiting    Social History   Socioeconomic History  . Marital status: Single    Spouse name: Not on file  . Number of children: 2  . Years of education: Not on file  . Highest education level: Not on file  Social Needs  . Financial resource strain: Not on file  . Food insecurity - worry: Not on file  . Food insecurity - inability: Not on file  . Transportation needs - medical: Not on file  . Transportation needs - non-medical: Not on file  Occupational History  . Occupation: Insurance claims handler: UNEMPLOYED  Tobacco Use  . Smoking status: Former Smoker    Packs/day: 0.50    Years: 14.00    Pack years: 7.00    Types: E-cigarettes    Last attempt to quit: 12/20/2013    Years since quitting: 3.0  . Smokeless tobacco: Never Used  . Tobacco comment: Vapor cigarettes.  Substance and Sexual Activity  . Alcohol use: No    Alcohol/week: 0.0 oz  . Drug use: No  . Sexual activity: No    Birth control/protection: IUD  Other Topics Concern  . Not on file  Social History Narrative  . Not on file    Family History  Problem Relation Age of Onset  . Asthma Maternal Grandmother      Review of Systems:  See HPI for pertinent ROS. All other ROS negative.    Physical Exam: Blood pressure 122/84, pulse 91, temperature 97.6 F (36.4 C), temperature source Oral, resp. rate 16, weight 113.6 kg (250 lb 6.4 oz), last menstrual period 07/06/2016, SpO2 98 %., Body mass index is 42.32 kg/m. General: WF. Appears in no acute distress. Neck: Supple. No thyromegaly. No lymphadenopathy. Lungs: Clear bilaterally to auscultation without wheezes, rales, or rhonchi. Breathing is unlabored. Heart: RRR with S1 S2. No murmurs, rubs, or gallops. Musculoskeletal:  Strength and tone  normal for age. Extremities/Skin: Warm and dry.  Neuro: Alert and oriented X 3. Moves all extremities spontaneously. Gait is normal. CNII-XII grossly in tact. Psych:  Responds to questions appropriately with a normal affect.     ASSESSMENT AND PLAN:  41 y.o. year old female with  1. Amenorrhea Check the following labs to evaluate. -  Follicle stimulating hormone - Luteinizing hormone - TSH  2. Hair loss Check the following labs to evaluate. - TSH  3. Anemia associated with nutritional deficiency See HPI for further information - Iron - CBC with Differential/Platelet - Ferritin  4. S/P laparoscopic sleeve gastrectomy 01/2014 See HPI for further information - Iron - CBC with Differential/Platelet - Ferritin   Signed, [facility] [place], [place], [facility] 01/06/2017 3:23 PM

## 2017-01-06 NOTE — Addendum Note (Signed)
Addended by: Dena Billet B on: 01/06/2017 03:50 PM   Modules accepted: Orders

## 2017-01-07 LAB — CBC WITH DIFFERENTIAL/PLATELET
Basophils Absolute: 20 cells/uL (ref 0–200)
Basophils Relative: 0.3 %
Eosinophils Absolute: 313 cells/uL (ref 15–500)
Eosinophils Relative: 4.6 %
HCT: 37.9 % (ref 35.0–45.0)
Hemoglobin: 12.7 g/dL (ref 11.7–15.5)
Lymphs Abs: 2230 cells/uL (ref 850–3900)
MCH: 28.9 pg (ref 27.0–33.0)
MCHC: 33.5 g/dL (ref 32.0–36.0)
MCV: 86.1 fL (ref 80.0–100.0)
MPV: 9.3 fL (ref 7.5–12.5)
Monocytes Relative: 9 %
Neutro Abs: 3624 cells/uL (ref 1500–7800)
Neutrophils Relative %: 53.3 %
Platelets: 284 10*3/uL (ref 140–400)
RBC: 4.4 10*6/uL (ref 3.80–5.10)
RDW: 12.2 % (ref 11.0–15.0)
Total Lymphocyte: 32.8 %
WBC mixed population: 612 cells/uL (ref 200–950)
WBC: 6.8 10*3/uL (ref 3.8–10.8)

## 2017-01-07 LAB — FOLLICLE STIMULATING HORMONE: FSH: 7.2 m[IU]/mL

## 2017-01-07 LAB — TEST AUTHORIZATION

## 2017-01-07 LAB — FERRITIN: Ferritin: 35 ng/mL (ref 10–232)

## 2017-01-07 LAB — IRON: Iron: 39 ug/dL — ABNORMAL LOW (ref 40–190)

## 2017-01-07 LAB — LUTEINIZING HORMONE: LH: 10.2 m[IU]/mL

## 2017-01-07 LAB — TSH: TSH: 1.02 mIU/L

## 2017-01-08 ENCOUNTER — Other Ambulatory Visit: Payer: Self-pay

## 2017-01-08 MED ORDER — VALACYCLOVIR HCL 500 MG PO TABS: 500.0000 mg | ORAL_TABLET | Freq: Every day | ORAL | 3 refills | Status: DC | PRN

## 2017-01-08 NOTE — Telephone Encounter (Signed)
Refill appropriate 

## 2017-03-01 ENCOUNTER — Ambulatory Visit: Payer: Medicare Other | Admitting: Physician Assistant

## 2017-03-03 ENCOUNTER — Encounter: Payer: Self-pay | Admitting: Physician Assistant

## 2017-03-17 DIAGNOSIS — F319 Bipolar disorder, unspecified: Secondary | ICD-10-CM | POA: Diagnosis not present

## 2017-05-13 ENCOUNTER — Other Ambulatory Visit: Payer: Self-pay

## 2017-05-13 MED ORDER — VALACYCLOVIR HCL 500 MG PO TABS: 500.0000 mg | ORAL_TABLET | Freq: Every day | ORAL | 1 refills | Status: DC | PRN

## 2017-05-13 NOTE — Telephone Encounter (Signed)
Refill appropriate 

## 2017-05-14 ENCOUNTER — Other Ambulatory Visit: Payer: Self-pay

## 2017-05-14 MED ORDER — VALACYCLOVIR HCL 500 MG PO TABS: 500.0000 mg | ORAL_TABLET | Freq: Every day | ORAL | 1 refills | Status: DC | PRN

## 2017-05-18 ENCOUNTER — Ambulatory Visit (INDEPENDENT_AMBULATORY_CARE_PROVIDER_SITE_OTHER): Payer: Medicare Other | Admitting: Family Medicine

## 2017-05-18 ENCOUNTER — Encounter: Payer: Self-pay | Admitting: Family Medicine

## 2017-05-18 ENCOUNTER — Other Ambulatory Visit: Payer: Self-pay

## 2017-05-18 VITALS — BP 128/62 | HR 94 | Temp 98.1°F | Resp 14 | Ht 68.0 in | Wt 253.0 lb

## 2017-05-18 DIAGNOSIS — T8130XA Disruption of wound, unspecified, initial encounter: Secondary | ICD-10-CM

## 2017-05-18 MED ORDER — SULFAMETHOXAZOLE-TRIMETHOPRIM 800-160 MG PO TABS: 1.0000 | ORAL_TABLET | Freq: Two times a day (BID) | ORAL | 0 refills | Status: DC

## 2017-05-18 NOTE — Progress Notes (Signed)
   Subjective:    Patient ID: [name], female    DOB: 10/29/1975, 42 y.o.   MRN: [id number]  Patient presents for Abdominal Wound (x2 months- non-healing surgical wound that appears red and possible cellulitis to surrounding skin)   Pt here with wound on abdomen  10 years ago, had major surgery and colostomy and then take down.   4 years ago had gastric sleeve, had seroma as a complication and has had intermittant opening and closing of the same area the drains.  Last seen 2 months 11/14   Told she had  Staph, noted on culture in 2016     Review Of Systems:  GEN- denies fatigue, fever, weight loss,weakness, recent illness HEENT- denies eye drainage, change in vision, nasal discharge, CVS- denies chest pain, palpitations RESP- denies SOB, cough, wheeze ABD- denies N/V, change in stools, abd pain GU- denies dysuria, hematuria, dribbling, incontinence MSK- denies joint pain, muscle aches, injury Neuro- denies headache, dizziness, syncope, seizure activity       Objective:    BP 128/62   Pulse 94   Temp 98.1 F (36.7 C) (Oral)   Resp 14   Ht 5\' 8"  (1.727 m)   Wt 253 lb (114.8 kg)   SpO2 98%   BMI 38.47 kg/m  GEN- NAD, alert and oriented x3 CVS- RRR, no murmur RESP-CTAB ABD-NABS,soft,, midline healed surgical scar at bottom ulcerated wound within scar, yellow pus draining, minimal erythema surrounding, mild TTP EXT- No edema Pulses- Radial 2+        Assessment & Plan:      Problem List Items Addressed This Visit    None    Visit Diagnoses    Wound dehiscence    -  Primary   recurrent opening of old wound. More ulcerated apperance, she does admit it gets worse when she gains weight. Given Bactrim, history of staph, wound cleaned at bedside her back to general surgery to look at the wound.      Note: This dictation was prepared with Dragon dictation along with smaller phrase technology. Any transcriptional errors that result from this process are  unintentional.

## 2017-05-18 NOTE — Patient Instructions (Signed)
Referral to surgeon  Take antibiotics

## 2017-05-24 DIAGNOSIS — S31109A Unspecified open wound of abdominal wall, unspecified quadrant without penetration into peritoneal cavity, initial encounter: Secondary | ICD-10-CM | POA: Diagnosis not present

## 2017-06-16 DIAGNOSIS — F319 Bipolar disorder, unspecified: Secondary | ICD-10-CM | POA: Diagnosis not present

## 2017-09-08 DIAGNOSIS — F319 Bipolar disorder, unspecified: Secondary | ICD-10-CM | POA: Diagnosis not present

## 2017-10-18 ENCOUNTER — Ambulatory Visit (INDEPENDENT_AMBULATORY_CARE_PROVIDER_SITE_OTHER): Payer: Medicare Other | Admitting: Physician Assistant

## 2017-10-18 ENCOUNTER — Other Ambulatory Visit: Payer: Self-pay | Admitting: Physician Assistant

## 2017-10-18 ENCOUNTER — Other Ambulatory Visit: Payer: Self-pay

## 2017-10-18 ENCOUNTER — Encounter: Payer: Self-pay | Admitting: Physician Assistant

## 2017-10-18 VITALS — BP 122/78 | HR 108 | Temp 98.7°F | Resp 14 | Ht 68.0 in | Wt 257.0 lb

## 2017-10-18 DIAGNOSIS — R1900 Intra-abdominal and pelvic swelling, mass and lump, unspecified site: Secondary | ICD-10-CM

## 2017-10-18 DIAGNOSIS — Z8719 Personal history of other diseases of the digestive system: Secondary | ICD-10-CM

## 2017-10-18 DIAGNOSIS — S301XXS Contusion of abdominal wall, sequela: Secondary | ICD-10-CM | POA: Diagnosis not present

## 2017-10-18 DIAGNOSIS — Z9889 Other specified postprocedural states: Secondary | ICD-10-CM

## 2017-10-18 DIAGNOSIS — Z9884 Bariatric surgery status: Secondary | ICD-10-CM | POA: Diagnosis not present

## 2017-10-18 NOTE — Progress Notes (Signed)
Patient ID: DEBANY VANTOL MRN: [id number], DOB: Aug 18, 1975, 42 y.o. Date of Encounter: 10/18/2017, 3:08 PM    Chief Complaint:  Chief Complaint  Patient presents with  . Hernia    knot like area to abd around umbilicus- states that area is uncomfortable     HPI: 42 y.o. year old female presents with above.   She has a history of laparoscopic sleeve gastrectomy in 01/2014. She has had chronic ongoing complications related to that surgery. She has history of abdominal wall seroma. She has history of wound dehiscence. Today she reports that she also has had incisional hernia in the lower area of her abdomen in the past.  Now, she reports that for the past 2 weeks she has been feeling a pulling sensation in her left abdomen and feels an area of firmness----she reports that this was not there in the past-- was not there prior to the past 2 weeks. She reports that she has had to pull on her son some and also has done some physical maneuvering with her grandmother.   Thinks that these actions may have caused a hernia develop. States that physical therapy has recently taught her grandmother to pull up -- using the walker.   Prior to that,  patient was "pulling on her grandmother" -- assisting her grandmother to help her pull up etc.     Home Meds:   Outpatient Medications Prior to Visit  Medication Sig Dispense Refill  . benztropine (COGENTIN) 2 MG tablet Take 2 mg by mouth at bedtime.    . haloperidol (HALDOL) 5 MG tablet 1 tab in AM and 3 tab at HS    . lisdexamfetamine (VYVANSE) 60 MG capsule Take 60 mg by mouth every morning.    Marland Kitchen NUVIGIL 250 MG tablet Take 250 mg by mouth daily.  5  . QUEtiapine (SEROQUEL) 300 MG tablet 2 hs    . valACYclovir (VALTREX) 500 MG tablet Take 1 tablet (500 mg total) by mouth daily as needed (fever blister). 90 tablet 1  . sulfamethoxazole-trimethoprim (BACTRIM DS,SEPTRA DS) 800-160 MG tablet Take 1 tablet by mouth 2 (two) times daily. 20 tablet 0    . zolpidem (AMBIEN) 10 MG tablet Take 10 mg by mouth at bedtime.      No facility-administered medications prior to visit.     Allergies:  Allergies  Allergen Reactions  . Nsaids       Review of Systems: See HPI for pertinent ROS. All other ROS negative.    Physical Exam: Blood pressure 122/78, pulse (!) 108, temperature 98.7 F (37.1 C), temperature source Oral, resp. rate 14, height 5\' 8"  (1.727 m), weight 116.6 kg, SpO2 96 %., Body mass index is 39.08 kg/m. General: WF.  Appears in no acute distress. Neck: Supple. No thyromegaly. No lymphadenopathy. Lungs: Clear bilaterally to auscultation without wheezes, rales, or rhonchi. Breathing is unlabored. Heart: Regular rhythm. No murmurs, rubs, or gallops. Abdomen:  Just right of the umbilicus there is a vertical scar. Just superior to and left of her umbilicus-- there are 2 small scars-- c/w scars from laparoscopy.  Above 1 of these areas is the area of firmness.  Is an approximate 2 cm diameter area of firmness.  No sign of strangulation or incarceration.  Msk:  Strength and tone normal for age. Extremities/Skin: Warm and dry.  Neuro: Alert and oriented X 3. Moves all extremities spontaneously. Gait is normal. CNII-XII grossly in tact. Psych:  Responds to questions appropriately with a normal  affect.     ASSESSMENT AND PLAN:  42 y.o. year old female with   1. Abdominal mass, unspecified abdominal location Concerned that the new area of firmness/mass and "pulling sensation" secondary to hernia. Will obtain ultrasound to evaluate. Her surgeon was [name] and this is who she has continued to follow-up with regarding seroma and other complications since this surgery. Will obtain ultrasound and will follow up with her when I get this result. If there is a hernia will refer to [name] for further management. Also have discussed strangulation, incarceration with patient.  Have reviewed indications for going to emergency  room. - US Abdomen Complete; Future  2. History of incisional hernia repair - US Abdomen Complete; Future  3. Abdominal wall seroma, sequela - US Abdomen Complete; Future  4. S/P laparoscopic sleeve gastrectomy 01/2014 - US Abdomen Complete; Future   Signed, Olean Ree [place], [place], [facility] 10/18/2017 3:08 PM

## 2017-10-21 ENCOUNTER — Ambulatory Visit: Payer: Medicare Other | Admitting: Physician Assistant

## 2017-10-22 ENCOUNTER — Ambulatory Visit (HOSPITAL_COMMUNITY)
Admission: RE | Admit: 2017-10-22 | Discharge: 2017-10-22 | Disposition: A | Discharge status: Home / Self Care | Payer: Medicare Other | Source: Ambulatory Visit | Attending: Physician Assistant | Admitting: Physician Assistant

## 2017-10-22 DIAGNOSIS — R188 Other ascites: Secondary | ICD-10-CM | POA: Diagnosis not present

## 2017-10-22 DIAGNOSIS — K8020 Calculus of gallbladder without cholecystitis without obstruction: Secondary | ICD-10-CM | POA: Insufficient documentation

## 2017-10-22 DIAGNOSIS — Z9889 Other specified postprocedural states: Secondary | ICD-10-CM | POA: Diagnosis not present

## 2017-10-22 DIAGNOSIS — R1900 Intra-abdominal and pelvic swelling, mass and lump, unspecified site: Secondary | ICD-10-CM | POA: Diagnosis not present

## 2017-10-22 DIAGNOSIS — N200 Calculus of kidney: Secondary | ICD-10-CM | POA: Insufficient documentation

## 2017-10-22 DIAGNOSIS — Z9884 Bariatric surgery status: Secondary | ICD-10-CM | POA: Diagnosis not present

## 2017-10-22 DIAGNOSIS — Z8719 Personal history of other diseases of the digestive system: Secondary | ICD-10-CM | POA: Diagnosis not present

## 2017-11-02 ENCOUNTER — Encounter: Payer: Self-pay | Admitting: Physician Assistant

## 2017-11-26 DIAGNOSIS — F319 Bipolar disorder, unspecified: Secondary | ICD-10-CM | POA: Diagnosis not present

## 2018-02-10 ENCOUNTER — Ambulatory Visit (INDEPENDENT_AMBULATORY_CARE_PROVIDER_SITE_OTHER): Payer: Medicare Other | Admitting: Family Medicine

## 2018-02-10 ENCOUNTER — Other Ambulatory Visit: Payer: Self-pay

## 2018-02-10 ENCOUNTER — Encounter: Payer: Self-pay | Admitting: Family Medicine

## 2018-02-10 VITALS — BP 132/80 | HR 104 | Temp 97.9°F | Resp 16 | Ht 68.0 in | Wt 262.0 lb

## 2018-02-10 DIAGNOSIS — J0100 Acute maxillary sinusitis, unspecified: Secondary | ICD-10-CM

## 2018-02-10 DIAGNOSIS — J209 Acute bronchitis, unspecified: Secondary | ICD-10-CM

## 2018-02-10 MED ORDER — DOXYCYCLINE HYCLATE 100 MG PO TABS: 100.0000 mg | ORAL_TABLET | Freq: Two times a day (BID) | ORAL | 0 refills | Status: AC

## 2018-02-10 MED ORDER — ALBUTEROL SULFATE HFA 108 (90 BASE) MCG/ACT IN AERS: 2.0000 | INHALATION_SPRAY | RESPIRATORY_TRACT | 0 refills | Status: DC | PRN

## 2018-02-10 MED ORDER — BENZONATATE 100 MG PO CAPS: 100.0000 mg | ORAL_CAPSULE | Freq: Three times a day (TID) | ORAL | 0 refills | Status: DC | PRN

## 2018-02-10 MED ORDER — PREDNISONE 20 MG PO TABS: 40.0000 mg | ORAL_TABLET | Freq: Every day | ORAL | 0 refills | Status: AC

## 2018-02-10 MED ORDER — IPRATROPIUM-ALBUTEROL 0.5-2.5 (3) MG/3ML IN SOLN
3.0000 mL | Freq: Once | RESPIRATORY_TRACT | Status: AC
  Administered 2018-02-10: 3 mL via RESPIRATORY_TRACT

## 2018-02-10 MED ORDER — ALBUTEROL SULFATE (2.5 MG/3ML) 0.083% IN NEBU: 2.5000 mg | INHALATION_SOLUTION | Freq: Four times a day (QID) | RESPIRATORY_TRACT | 1 refills | Status: DC | PRN

## 2018-02-10 NOTE — Progress Notes (Signed)
Patient ID: [name], female    DOB: [birth year], 42 y.o.   MRN: [id number]  PCP: Orlena Sheldon, PA-C  Chief Complaint  Patient presents with  . Illness    x4 days- productive cough with yellow sputum, HA, congestion- denies fever    Subjective:   [name] is a 42 y.o. female, presents to clinic with CC of 4 days of productive cough with yellow sputum, headache nasal discharge and congestion with some generalized malaise and fatigue and some wheeze and shortness of breath.  She had bronchitis about 2 years ago.  She has used E cigarettes and vaping but states that she did recently start smoking again.  She has had multiple sick contacts of her family members who have had colds and coughs.  She has been trying to take Mucinex but it has not helped with any of her coughing symptoms.  Her nasal symptoms and sinus pain and pressure have improved a little bit today she states is her "best day" since she is been ill, she does still have headache.   Patient Active Problem List   Diagnosis Date Noted  . Degeneration of lumbar or lumbosacral intervertebral disc 12/13/2014  . Anemia associated with nutritional deficiency 11/12/2014  . Bipolar affective disorder ([place]) 09/28/2014  . Anxiety state 09/28/2014  . S/P laparoscopic sleeve gastrectomy 01/2014 09/28/2014  . Abdominal wall seroma - chronic   . Seroma   . Cholelithiases 04/25/2013  . Cough 09/19/2011  . Smoker 09/19/2011  . GERD 03/04/2010  . RECTAL BLEEDING 03/04/2010  . DYSPHAGIA UNSPECIFIED 03/04/2010  . DYSPHAGIA 03/04/2010  . RUQ PAIN 03/04/2010     Prior to Admission medications   Medication Sig Start Date End Date Taking? Authorizing Provider  benztropine (COGENTIN) 2 MG tablet Take 2 mg by mouth at bedtime.   Yes [provider]  clonazePAM (KLONOPIN) 1 MG tablet Take 1 mg by mouth daily.   Yes [provider]  haloperidol (HALDOL) 5 MG tablet 1 tab in AM and 3 tab at [facility] 11/21/14  Yes  [provider]  lisdexamfetamine (VYVANSE) 60 MG capsule Take 60 mg by mouth every morning.   Yes [provider]  LORazepam (ATIVAN) 1 MG tablet Take 1 mg by mouth daily.   Yes [provider]  NUVIGIL 250 MG tablet Take 250 mg by mouth daily. 11/14/14  Yes [provider]  QUEtiapine (SEROQUEL) 300 MG tablet 2 hs 11/21/14  Yes [provider]  valACYclovir (VALTREX) 500 MG tablet Take 1 tablet (500 mg total) by mouth daily as needed (fever blister). 05/14/17  Yes Orlena Sheldon, PA-C     Allergies  Allergen Reactions  . Nsaids      Family History  Problem Relation Age of Onset  . Asthma Maternal Grandmother      Social History   Socioeconomic History  . Marital status: Single    Spouse name: Not on file  . Number of children: 2  . Years of education: Not on file  . Highest education level: Not on file  Occupational History  . Occupation: Insurance claims handler: UNEMPLOYED  Social Needs  . Financial resource strain: Not on file  . Food insecurity:    Worry: Not on file    Inability: Not on file  . Transportation needs:    Medical: Not on file    Non-medical: Not on file  Tobacco Use  . Smoking status: Former Smoker  Packs/day: 0.50    Years: 14.00    Pack years: 7.00    Types: E-cigarettes    Last attempt to quit: 12/20/2013    Years since quitting: 4.1  . Smokeless tobacco: Never Used  . Tobacco comment: Vapor cigarettes.  Substance and Sexual Activity  . Alcohol use: No    Alcohol/week: 0.0 standard drinks  . Drug use: No  . Sexual activity: Never    Birth control/protection: I.U.D.  Lifestyle  . Physical activity:    Days per week: Not on file    Minutes per session: Not on file  . Stress: Not on file  Relationships  . Social connections:    Talks on phone: Not on file    Gets together: Not on file    Attends religious service: Not on file    Active member of club or organization: Not on file     Attends meetings of clubs or organizations: Not on file    Relationship status: Not on file  . Intimate partner violence:    Fear of current or ex partner: Not on file    Emotionally abused: Not on file    Physically abused: Not on file    Forced sexual activity: Not on file  Other Topics Concern  . Not on file  Social History Narrative  . Not on file     Review of Systems  Constitutional: Positive for activity change and fatigue. Negative for unexpected weight change.  HENT: Negative.   Eyes: Negative.   Respiratory: Negative.   Cardiovascular: Negative.   Gastrointestinal: Negative.   Endocrine: Negative.   Genitourinary: Negative.   Musculoskeletal: Negative.   Skin: Negative.   Allergic/Immunologic: Negative.   Neurological: Negative.   Hematological: Negative.   Psychiatric/Behavioral: Negative.   All other systems reviewed and are negative.      Objective:    Vitals:   02/10/18 0948  BP: 132/80  Pulse: 100  Resp: 16  Temp: 97.9 F (36.6 C)  TempSrc: Oral  SpO2: 97%  Weight: 262 lb (118.8 kg)  Height: 5\' 8"  (1.727 m)      Physical Exam Vitals signs and nursing note reviewed.  Constitutional:      General: She is not in acute distress.    Appearance: She is well-developed. She is obese. She is ill-appearing. She is not toxic-appearing or diaphoretic.  HENT:     Head: Normocephalic and atraumatic.     Right Ear: Hearing, tympanic membrane, ear canal and external ear normal.     Left Ear: Hearing, tympanic membrane, ear canal and external ear normal.     Nose: Mucosal edema, congestion and rhinorrhea present.     Right Turbinates: Enlarged and swollen.     Left Turbinates: Enlarged and swollen.     Right Sinus: Maxillary sinus tenderness and frontal sinus tenderness present.     Left Sinus: Maxillary sinus tenderness and frontal sinus tenderness present.     Mouth/Throat:     Lips: Pink.     Mouth: Mucous membranes are not pale and dry.      Pharynx: Uvula midline. Posterior oropharyngeal erythema present. No pharyngeal swelling, oropharyngeal exudate or uvula swelling.     Tonsils: No tonsillar exudate or tonsillar abscesses.  Eyes:     General: No scleral icterus.       Right eye: No discharge.        Left eye: No discharge.     Conjunctiva/sclera: Conjunctivae normal.     Pupils:  Pupils are equal, round, and reactive to light.  Neck:     Musculoskeletal: Normal range of motion and neck supple.     Trachea: No tracheal deviation.  Cardiovascular:     Rate and Rhythm: Regular rhythm. Tachycardia present.     Chest Wall: PMI is not displaced.     Pulses:          Radial pulses are 2+ on the right side and 2+ on the left side.     Heart sounds: Normal heart sounds. Heart sounds not distant. No murmur. No friction rub. No gallop.   Pulmonary:     Effort: Pulmonary effort is normal. No respiratory distress.     Breath sounds: No stridor. Decreased breath sounds, wheezing and rhonchi present. No rales.     Comments: Diminished and very coarse and congested breath sounds bilaterally at the bases and to bilateral mid lung fields as well, coughing fits and expiratory wheeze Abdominal:     General: Bowel sounds are normal. There is no distension.     Palpations: Abdomen is soft.     Tenderness: There is no abdominal tenderness.  Musculoskeletal: Normal range of motion.  Skin:    General: Skin is warm and dry.     Coloration: Skin is not pale.     Findings: No rash.  Neurological:     Mental Status: She is alert.     Motor: No abnormal muscle tone.     Coordination: Coordination normal.  Psychiatric:        Mood and Affect: Affect is blunt.        Speech: Speech normal.        Behavior: Behavior normal.           Assessment & Plan:      ICD-10-CM   1. Acute bronchitis, unspecified organism J20.9 doxycycline (VIBRA-TABS) 100 MG tablet    predniSONE (DELTASONE) 20 MG tablet    albuterol (PROVENTIL HFA;VENTOLIN HFA)  108 (90 Base) MCG/ACT inhaler    ipratropium-albuterol (DUONEB) 0.5-2.5 (3) MG/3ML nebulizer solution 3 mL    benzonatate (TESSALON) 100 MG capsule    albuterol (PROVENTIL) (2.5 MG/3ML) 0.083% nebulizer solution  2. Acute non-recurrent maxillary sinusitis J01.00 doxycycline (VIBRA-TABS) 100 MG tablet    Patient without 4 days of URI symptoms and cough rapidly worsening in her chest with new onset of fever and sweats in the last 1 to 2 days.  On exam she has extreme congestion in her mid to lower lung fields bilaterally but worse in the right than in the left and diffuse wheeze.  Is given a breathing treatment and wheeze increased with continued diminished breath sounds and coarse rales and scattered rhonchi, rales are heard worse to the right lower lung field.  We will treat for acute bronchitis, I am already going to treat her for sinusitis with doxycycline and this would also cover her for any possible community-acquired pneumonia to her right lower lung field.  Congestion coarse rales may also be some bronchiolitis and or atelectasis, with her frequent coughing and wheeze she may not be fully expanding part of her lungs, did improve with breathing treatment, I have asked the patient to follow-up closely to be rechecked and if not improving would like to get a chest x-ray to further evaluate.   She did start smoking again, encouraged to decrease smoking both cigarettes and any vaping or e-cigarettes. Her mouth is extremely dry and she had mild tachycardia but did believe these are likely  secondary to her multiple medications including Vyvanse, Cogentin, Haldol, Seroquel.  Encouraged her to push fluids, supportive and symptomatic treatment with rest, fluids, over-the-counter medicine such as Tylenol for pain, body aches or fever.  She did appear mildly ill but not toxic or septic.  Delsa Grana, PA-C 02/10/18 9:55 AM

## 2018-02-18 DIAGNOSIS — F319 Bipolar disorder, unspecified: Secondary | ICD-10-CM | POA: Diagnosis not present

## 2018-02-18 DIAGNOSIS — F4010 Social phobia, unspecified: Secondary | ICD-10-CM | POA: Diagnosis not present

## 2018-02-21 ENCOUNTER — Telehealth: Payer: Self-pay | Admitting: Physician Assistant

## 2018-02-21 NOTE — Telephone Encounter (Signed)
Left message return call

## 2018-02-21 NOTE — Telephone Encounter (Signed)
Patient was here recently and was prescribed antibiotics, however is not better, would like to know if more antibiotics can be called in  [facility] [name]

## 2018-02-28 NOTE — Telephone Encounter (Signed)
No return call from patient. Closing this message at this time.

## 2018-03-17 ENCOUNTER — Other Ambulatory Visit: Payer: Self-pay | Admitting: Family Medicine

## 2018-03-17 DIAGNOSIS — J209 Acute bronchitis, unspecified: Secondary | ICD-10-CM

## 2018-03-21 ENCOUNTER — Other Ambulatory Visit: Payer: Self-pay

## 2018-03-21 MED ORDER — VALACYCLOVIR HCL 500 MG PO TABS: 500.0000 mg | ORAL_TABLET | Freq: Every day | ORAL | 0 refills | Status: DC | PRN

## 2018-03-22 ENCOUNTER — Encounter: Payer: Self-pay | Admitting: Family Medicine

## 2018-03-22 ENCOUNTER — Ambulatory Visit (INDEPENDENT_AMBULATORY_CARE_PROVIDER_SITE_OTHER): Payer: Medicare Other | Admitting: Family Medicine

## 2018-03-22 VITALS — BP 120/88 | HR 102 | Temp 97.7°F | Resp 15 | Ht 68.0 in | Wt 273.4 lb

## 2018-03-22 DIAGNOSIS — Z13220 Encounter for screening for lipoid disorders: Secondary | ICD-10-CM

## 2018-03-22 DIAGNOSIS — Z79899 Other long term (current) drug therapy: Secondary | ICD-10-CM | POA: Diagnosis not present

## 2018-03-22 DIAGNOSIS — R000 Tachycardia, unspecified: Secondary | ICD-10-CM | POA: Diagnosis not present

## 2018-03-22 DIAGNOSIS — R635 Abnormal weight gain: Secondary | ICD-10-CM | POA: Diagnosis not present

## 2018-03-22 DIAGNOSIS — Z131 Encounter for screening for diabetes mellitus: Secondary | ICD-10-CM

## 2018-03-22 DIAGNOSIS — Z6841 Body Mass Index (BMI) 40.0 and over, adult: Secondary | ICD-10-CM

## 2018-03-22 DIAGNOSIS — Z23 Encounter for immunization: Secondary | ICD-10-CM | POA: Diagnosis not present

## 2018-03-22 NOTE — Progress Notes (Signed)
Patient ID: [name], female    DOB: Jul 28, 1975, 43 y.o.   MRN: [id number]  PCP: Orlena Sheldon, PA-C  Chief Complaint  Patient presents with  . Weight Loss    Patient in today wanting to discuss starting on phentermine for weight loss     Subjective:   [name] is a 43 y.o. female, presents to clinic with CC of weight gain/morbid obesity and wants to start taking phentermine.  She says she has taken it many times in the past states that one time she lost 50 to 60 pounds.  She had sleeve gastrectomy few years ago states that she took phentermine before that she can remember the doctor's name that prescribed it.  Patient is on several medicines including multiple controlled medicines, she does have bipolar disorder is on Klonopin, Haldol, Cogentin and Seroquel.  Do not see any recent labs or any recent EKG. she additionally has Ativan for anxiety Vyvanse for ADHD?    Did not know the last time she is had her blood sugar or cholesterol checked.  Of seen her before for respiratory complaints she had elevated heart rate at that visit she continues to have heart rate around 100 today, she denies any chest pain she denies any cardiac history.  Her breathing has improved since she was last seen.  Patient does not smoke cigarettes anymore but does vape and she says that it has nicotine and when asked having times a day she uses that she states "all day long".  On patient's chart she has a history of substance abuse, 12 years ago had crack cocaine use She is a history of a perforated bowel which per chart review was from 2008 for diverticulitis, she had complications with her sleeve gastrectomy with recurrent seromas and prolonged wound healing.  Patient is being a somewhat difficult historian she keeps changing information about her prior use of phentermine.  In reviewing her weight loss surgery and process to get there she states that she did go to the weight loss center did have to  go to see a separate psychiatrist also had to do nutritional counseling she states that all that cost her a lot of money. Over several years she has steadily gained weight, she is not exercising or working on any particular diet. She does see psychiatry fairly regularly for all of her medications pertinent to that, previously talked about dry mouth side effects, has some extraparametal side effects and I suspect weight gain as a side effect from those medications as well. Wt Readings from Last 20 Encounters:  03/22/18 273 lb 6 oz (124 kg)  02/10/18 262 lb (118.8 kg)  10/18/17 257 lb (116.6 kg)  05/18/17 253 lb (114.8 kg)  01/06/17 250 lb 6.4 oz (113.6 kg)  04/22/16 250 lb (113.4 kg)  12/10/15 237 lb 8 oz (107.7 kg)  10/30/15 240 lb (108.9 kg)  09/17/15 239 lb 12.8 oz (108.8 kg)  08/28/15 236 lb (107 kg)  08/19/15 233 lb 11.2 oz (106 kg)  07/10/15 234 lb 9.6 oz (106.4 kg)  05/27/15 230 lb (104.3 kg)  05/23/15 228 lb 9.6 oz (103.7 kg)  04/08/15 223 lb 14.4 oz (101.6 kg)  04/03/15 225 lb (102.1 kg)  03/12/15 223 lb 8 oz (101.4 kg)  02/27/15 227 lb (103 kg)  02/11/15 219 lb (99.3 kg)  02/06/15 220 lb (99.8 kg)   In reviewing chart last labs available are from January 06, 2017 LH, FSH, TSH, CBC  differential and some anemia labs were drawn No CMP or Lipid Pt doesn't recall having any routine labs done anywhere else in the past 1-2 years.  She has nuvigil - a CNS stimulant and also vyvanse prescribed  For anxiety PRN she has ativan and klonopin prescribed Most recent psychiatry visit was 02/18/18 - note and meds reviewed, only dx is bipolar 1 disorder and social anxiety - cannot see any HPI info pertinent to visits through care everywhere. EKG reviewed - last was 05/26/2013 - at that time no QTc prolongation.  Pt continues to ask if she can have her medicines today. When I tell her no she asks if she can get they by Friday. I explained the process to assess her risk - which I believe and  have told her is already too high with her current HR, vaping use, multiple other controlled substances, multiple stimulants and tachycardia - but she continues to ask if she can have the meds by Friday.    Further chart review -   Dr. Greer Pickerel - OV from 05/11/13 notes "Despite numerous attempts for sustained weight loss she has been unsuccessful. She has tried phentermine on 4 different occasions. The most recent 2 attempts have been unsuccessful. Her most successful phentermine attempt was on her first trial of it when she lost 60 pounds. She's also tried low-calorie diets on numerous occasions but also those failed as well."  From what I can see in reviewing care everywhere nurse documentation, phone call messages and office visits with psychiatry patient behaviors consistent there is it is here she will call multiple times within 1 or 2 hours with the same question.  There is notation of her requesting Ambien and other controlled substances which she had not previously had before.   Patient Active Problem List   Diagnosis Date Noted  . Degeneration of lumbar or lumbosacral intervertebral disc 12/13/2014  . Anemia associated with nutritional deficiency 11/12/2014  . Bipolar affective disorder ([place]) 09/28/2014  . Anxiety state 09/28/2014  . S/P laparoscopic sleeve gastrectomy 01/2014 09/28/2014  . Abdominal wall seroma - chronic   . Seroma   . Cholelithiases 04/25/2013  . Cough 09/19/2011  . Smoker 09/19/2011  . GERD 03/04/2010  . RECTAL BLEEDING 03/04/2010  . DYSPHAGIA UNSPECIFIED 03/04/2010  . DYSPHAGIA 03/04/2010  . RUQ PAIN 03/04/2010     Prior to Admission medications   Medication Sig Start Date End Date Taking? Authorizing Provider  benztropine (COGENTIN) 2 MG tablet Take 2 mg by mouth at bedtime.   Yes [provider]  clonazePAM (KLONOPIN) 1 MG tablet Take 1 mg by mouth daily.   Yes [provider]  haloperidol (HALDOL) 5 MG tablet 1 tab in AM and 3 tab  at [facility] 11/21/14  Yes [provider]  lisdexamfetamine (VYVANSE) 60 MG capsule Take 60 mg by mouth every morning.   Yes [provider]  LORazepam (ATIVAN) 1 MG tablet Take 1 mg by mouth daily.   Yes [provider]  NUVIGIL 250 MG tablet Take 250 mg by mouth daily. 11/14/14  Yes [provider]  QUEtiapine (SEROQUEL) 300 MG tablet 2 hs 11/21/14  Yes [provider]  valACYclovir (VALTREX) 500 MG tablet Take 1 tablet (500 mg total) by mouth daily as needed (fever blister). 03/21/18  Yes [place], Modena Nunnery, MD     Allergies  Allergen Reactions  . Nsaids      Family History  Problem Relation Age of Onset  . Asthma Maternal  Grandmother      Social History   Socioeconomic History  . Marital status: Single    Spouse name: Not on file  . Number of children: 2  . Years of education: Not on file  . Highest education level: Not on file  Occupational History  . Occupation: Insurance claims handler: UNEMPLOYED  Social Needs  . Financial resource strain: Not on file  . Food insecurity:    Worry: Not on file    Inability: Not on file  . Transportation needs:    Medical: Not on file    Non-medical: Not on file  Tobacco Use  . Smoking status: Current Every Day Smoker    Packs/day: 0.50    Years: 14.00    Pack years: 7.00    Types: E-cigarettes, Cigarettes    Last attempt to quit: 12/20/2013    Years since quitting: 4.2  . Smokeless tobacco: Never Used  . Tobacco comment: Vapor cigarettes.  Substance and Sexual Activity  . Alcohol use: No    Alcohol/week: 0.0 standard drinks  . Drug use: No  . Sexual activity: Never    Birth control/protection: I.U.D.  Lifestyle  . Physical activity:    Days per week: Not on file    Minutes per session: Not on file  . Stress: Not on file  Relationships  . Social connections:    Talks on phone: Not on file    Gets together: Not on file    Attends religious service: Not on file    Active  member of club or organization: Not on file    Attends meetings of clubs or organizations: Not on file    Relationship status: Not on file  . Intimate partner violence:    Fear of current or ex partner: Not on file    Emotionally abused: Not on file    Physically abused: Not on file    Forced sexual activity: Not on file  Other Topics Concern  . Not on file  Social History Narrative  . Not on file     Review of Systems  Constitutional: Negative.   HENT: Negative.   Eyes: Negative.   Respiratory: Negative.   Cardiovascular: Negative.   Gastrointestinal: Negative.   Endocrine: Negative.   Genitourinary: Negative.   Musculoskeletal: Negative.   Skin: Negative.   Allergic/Immunologic: Negative.   Neurological: Negative.   Hematological: Negative.   Psychiatric/Behavioral: Negative.   All other systems reviewed and are negative.      Objective:    Vitals:   03/22/18 0901  BP: 120/88  Pulse: (!) 102  Resp: 15  Temp: 97.7 F (36.5 C)  TempSrc: Oral  SpO2: 97%  Weight: 273 lb 6 oz (124 kg)  Height: 5\' 8"  (1.727 m)      Physical Exam Vitals signs and nursing note reviewed.  Constitutional:      General: She is not in acute distress.    Appearance: She is well-developed. She is obese. She is not ill-appearing, toxic-appearing or diaphoretic.  HENT:     Head: Normocephalic and atraumatic.     Right Ear: External ear normal.     Left Ear: External ear normal.     Nose: Nose normal.     Mouth/Throat:     Mouth: Mucous membranes are dry.     Pharynx: No posterior oropharyngeal erythema.  Eyes:     General:        Right eye: No discharge.  Left eye: No discharge.     Conjunctiva/sclera: Conjunctivae normal.  Neck:     Trachea: No tracheal deviation.  Cardiovascular:     Rate and Rhythm: Regular rhythm. Tachycardia present.     Pulses: Normal pulses.     Heart sounds: Normal heart sounds. No murmur. No friction rub. No gallop.   Pulmonary:     Effort:  Pulmonary effort is normal. No respiratory distress.     Breath sounds: Normal breath sounds. No stridor. No wheezing, rhonchi or rales.  Abdominal:     General: Bowel sounds are normal.  Musculoskeletal: Normal range of motion.  Skin:    General: Skin is warm and dry.     Findings: No rash.  Neurological:     Mental Status: She is alert.     Motor: No tremor or abnormal muscle tone.     Coordination: Coordination normal.     Gait: Gait normal.  Psychiatric:        Attention and Perception: She is inattentive.        Behavior: Behavior is uncooperative.     EKG: sinus rhythm, rate 95, right axis deviation, QTc normal - 435, no ST elevation or depression.  Compared to 05/26/2013 EKG - no significant or concerning EKG changes     Assessment & Plan:      ICD-10-CM   1. Class 3 severe obesity with body mass index (BMI) of 40.0 to 44.9 in adult, unspecified obesity type, unspecified whether serious comorbidity present ([place]) E66.01 Amb Ref to Medical Weight Management   Z68.41 CBC with Differential/Platelet    COMPLETE METABOLIC PANEL WITH GFR    TSH    Lipid panel  2. Need for influenza vaccination Z23 Flu Vaccine QUAD 6+ mos PF IM (Fluarix Quad PF)  3. Screening for hyperlipidemia Z13.220 Lipid panel  4. Screening for diabetes mellitus Z13.1   5. Weight gain R63.5 TSH  6. Tachycardia R00.0 EKG 12-Lead    Pt on multiple meds for bipolar, on multiple controlled substances as well, stimulants and benzo's - she is mildly tachycardic and was at last appt as well, she comes today requesting phentermine -   Risk factors - smoker, obesity, suspect metabolic syndrome - check cholesterol, tsh, fasting sugar (no labs done for over a year).  EKG today to eval rhythm - tachy, assess QTc with meds -EKG shows normal rhythm, right axis deviation, QTC is not prolonged, no change from her EKG in 2015  I have explained to the patient that with her current meds, current heart rate, I hesitate to give  another stimulant on top of her current meds - Have referred her to the weight management specialty at [facility].  Pt has previously done bariatric surgery, states she's taken phentermine 3 x before, last was 2 years ago?  But cannot verify this in chart - she had several surgeries and many complications in [id number] - she believes she took phentermine before then.    Labs pending, but her history of cocaine use, her current meds which are very concerning the amount of stimulating and benzodiazepines in addition to multiple other psychiatric medications without any recent labs no EKG monitoring and without the ability to verify any information or HPI with her psychiatry visits feel the risk of prescribing phentermine which would likely be unsuccessful, is way too high and I will not prescribe it.  Do want to evaluate her labs, history of anemia, she has a history of dyslipidemia.  The amount number of  meds is so concerning I am forwarding chart to my supervising physician for review to see if there is any need to flag her chart or flag pharmacies or reach out to [facility] psychiatry department to see there is any concern for potential abusive of  controlled substances.  Controlled Substance Prescriptions [place] Controlled Substance Registry consulted - meds are prescribed regularly, filled at one pharmacy, seems to be only two recent prescribers which are visible though care everywhere.  No labs in care everywhere since 2016 Most recent labs in epic our system is from 12/2016 for amenorrhea. Concern for lacking of appropriate monitoring with pts current medications and PHMx and Social history  Greater than 50% of this visit was spent in direct face-to-face counseling, obtaining history and physical, discussing and educating pt on treatment plan.  Total time of this visit was >45 min Remainder of time involved but was not limited to reviewing chart (recent and pertinent OV notes and labs), documentation  in EMR, and coordinating care and treatment plan.   Delsa Grana, PA-C 03/22/18 9:34 AM

## 2018-03-23 ENCOUNTER — Telehealth: Payer: Self-pay | Admitting: Physician Assistant

## 2018-03-23 ENCOUNTER — Other Ambulatory Visit: Payer: Medicare Other

## 2018-03-23 ENCOUNTER — Encounter: Payer: Self-pay | Admitting: Family Medicine

## 2018-03-23 ENCOUNTER — Telehealth: Payer: Self-pay | Admitting: Family Medicine

## 2018-03-23 DIAGNOSIS — R635 Abnormal weight gain: Secondary | ICD-10-CM

## 2018-03-23 DIAGNOSIS — Z13220 Encounter for screening for lipoid disorders: Secondary | ICD-10-CM

## 2018-03-23 DIAGNOSIS — Z6841 Body Mass Index (BMI) 40.0 and over, adult: Principal | ICD-10-CM

## 2018-03-23 LAB — CBC WITH DIFFERENTIAL/PLATELET
Absolute Monocytes: 610 cells/uL (ref 200–950)
Basophils Absolute: 12 cells/uL (ref 0–200)
Basophils Relative: 0.2 %
Eosinophils Absolute: 122 cells/uL (ref 15–500)
Eosinophils Relative: 2 %
HCT: 35.8 % (ref 35.0–45.0)
Hemoglobin: 12 g/dL (ref 11.7–15.5)
Lymphs Abs: 872 cells/uL (ref 850–3900)
MCH: 27.8 pg (ref 27.0–33.0)
MCHC: 33.5 g/dL (ref 32.0–36.0)
MCV: 82.9 fL (ref 80.0–100.0)
MPV: 9.2 fL (ref 7.5–12.5)
Monocytes Relative: 10 %
Neutro Abs: 4484 cells/uL (ref 1500–7800)
Neutrophils Relative %: 73.5 %
Platelets: 246 10*3/uL (ref 140–400)
RBC: 4.32 10*6/uL (ref 3.80–5.10)
RDW: 13.1 % (ref 11.0–15.0)
Total Lymphocyte: 14.3 %
WBC: 6.1 10*3/uL (ref 3.8–10.8)

## 2018-03-23 LAB — COMPLETE METABOLIC PANEL WITH GFR
AG Ratio: 1.8 (calc) (ref 1.0–2.5)
ALT: 12 U/L (ref 6–29)
AST: 11 U/L (ref 10–30)
Albumin: 4.1 g/dL (ref 3.6–5.1)
Alkaline phosphatase (APISO): 78 U/L (ref 33–115)
BUN: 8 mg/dL (ref 7–25)
CO2: 26 mmol/L (ref 20–32)
Calcium: 9 mg/dL (ref 8.6–10.2)
Chloride: 101 mmol/L (ref 98–110)
Creat: 0.61 mg/dL (ref 0.50–1.10)
GFR, Est African American: 131 mL/min/{1.73_m2} (ref >=60)
GFR, Est Non African American: 113 mL/min/{1.73_m2} (ref >=60)
Globulin: 2.3 g/dL (calc) (ref 1.9–3.7)
Glucose, Bld: 89 mg/dL (ref 65–99)
Potassium: 4.2 mmol/L (ref 3.5–5.3)
Sodium: 138 mmol/L (ref 135–146)
Total Bilirubin: 0.4 mg/dL (ref 0.2–1.2)
Total Protein: 6.4 g/dL (ref 6.1–8.1)

## 2018-03-23 LAB — LIPID PANEL
Cholesterol: 174 mg/dL (ref <200)
HDL: 51 mg/dL (ref >50)
LDL Cholesterol (Calc): 99 mg/dL (calc)
Non-HDL Cholesterol (Calc): 123 mg/dL (calc) (ref <130)
Total CHOL/HDL Ratio: 3.4 (calc) (ref <5.0)
Triglycerides: 147 mg/dL (ref <150)

## 2018-03-23 LAB — TSH: TSH: 0.79 mIU/L

## 2018-03-23 NOTE — Telephone Encounter (Signed)
Spoke with patient and informed her to sign records release. Patient verbalized understanding.

## 2018-03-23 NOTE — Telephone Encounter (Signed)
Thanks -  Will need to have her sign for any records that we cannot see pertaining to this (at some point). Don't know that verifying this will change the decision to prescribe phentermine to her currently, but thank you!  [name]

## 2018-03-23 NOTE — Telephone Encounter (Signed)
Ok, then no need to sign for records - pertinent hx will be obtained from [facility] notes from bariatric surgeon.  Thank you.   Again - can tell pt that when she had phentermine in the past is not going to have much bearing on my decision to prescribe it or not - I think that she is TOO HIGH of risk with HR, dehydration, multiple controlled substances prescribed already (including stimulants for ADHD), smoking etc, and that she will likely need to go to the weight loss specialist/clinic to approach weightloss/obesity more holistically.  A high risk stimulant, controlled substance medication, that she has had 4x before, will not change everything for her, its going to take more work that that with counseling, education, exercise, etc.    She is free to seek second opinions.

## 2018-03-23 NOTE — Telephone Encounter (Signed)
Patient called in and left a voicemail in regards to the Doctor that she saw Dr. Nechama Guard. She stated that she last saw him over 15 years ago and she is unsure if she can get records and she stated that " he was old then and he is probably not practicing any more". Called patient and informed her that I would relay the information to you.

## 2018-03-23 NOTE — Telephone Encounter (Signed)
Doctor who prescribed her weight loss medication is Dr. Nechama Guard -- leisa needed to know

## 2018-03-24 ENCOUNTER — Encounter: Payer: Self-pay | Admitting: Family Medicine

## 2018-03-24 NOTE — Telephone Encounter (Signed)
Spoke with patient and discussed below message from Delsa Grana, PA-C. Patient verbalized understanding.

## 2018-03-25 NOTE — Progress Notes (Signed)
I do not recommend using phentermine with her active medications and bipolar diagnosis.  Since she has history of gastric sleeve with ongoing weight gain/problems, she needs to be referred back to a dietician and can even see the weight loss surgeon at [facility] surgery

## 2018-04-09 ENCOUNTER — Other Ambulatory Visit: Payer: Self-pay | Admitting: Family Medicine

## 2018-04-09 DIAGNOSIS — J209 Acute bronchitis, unspecified: Secondary | ICD-10-CM

## 2018-05-20 DIAGNOSIS — F319 Bipolar disorder, unspecified: Secondary | ICD-10-CM | POA: Diagnosis not present

## 2018-05-20 DIAGNOSIS — F4010 Social phobia, unspecified: Secondary | ICD-10-CM | POA: Diagnosis not present

## 2018-05-26 ENCOUNTER — Other Ambulatory Visit: Payer: Self-pay

## 2018-05-26 ENCOUNTER — Encounter: Payer: Self-pay | Admitting: Family Medicine

## 2018-05-26 ENCOUNTER — Ambulatory Visit (INDEPENDENT_AMBULATORY_CARE_PROVIDER_SITE_OTHER): Payer: Medicare Other | Admitting: Family Medicine

## 2018-05-26 VITALS — BP 120/80 | HR 101 | Temp 98.1°F | Resp 16 | Ht 68.0 in | Wt 271.2 lb

## 2018-05-26 DIAGNOSIS — S31109A Unspecified open wound of abdominal wall, unspecified quadrant without penetration into peritoneal cavity, initial encounter: Secondary | ICD-10-CM | POA: Diagnosis not present

## 2018-05-26 DIAGNOSIS — R509 Fever, unspecified: Secondary | ICD-10-CM

## 2018-05-26 DIAGNOSIS — L089 Local infection of the skin and subcutaneous tissue, unspecified: Secondary | ICD-10-CM

## 2018-05-26 DIAGNOSIS — S31109S Unspecified open wound of abdominal wall, unspecified quadrant without penetration into peritoneal cavity, sequela: Secondary | ICD-10-CM | POA: Diagnosis not present

## 2018-05-26 DIAGNOSIS — T8130XA Disruption of wound, unspecified, initial encounter: Secondary | ICD-10-CM | POA: Diagnosis not present

## 2018-05-26 MED ORDER — MUPIROCIN 2 % EX OINT: 1.0000 "application " | TOPICAL_OINTMENT | Freq: Two times a day (BID) | CUTANEOUS | 0 refills | Status: AC

## 2018-05-26 NOTE — Progress Notes (Signed)
Patient ID: [name], female    DOB: 11/03/1975, 43 y.o.   MRN: [id number]  PCP: Delsa Grana, PA-C  Chief Complaint  Patient presents with  . Mass    opens and closes, was staph, put on 2 rounds of abx and it healed    Subjective:   [name] is a 43 y.o. female, presents to clinic with CC of fever this morning when she was going to a dentist appt for a filling - they did a forehead scanned outside the office in the parking lot.  She did not feel ill or feverish.  She was told she had to reschedule due to COVID-19 precautions. She also reports a reoccurance of an abdominal wound that "bust open again" a month ago.  Had post-op wound infections, dehiscence, + wound cultures and delayed wound healing, in the past with her old PCP they did several rounds of abx and wound care to get it to heal.   She has no purulent drainage, no spreading redness.  Over the past month the size of the wound has not changed, its low abdomen, midline about 2cm wide and 6 cm long, with some scabbing areas, some red raw areas and some light yellow areas.  She does not always have a bandage over it because it makes "more of it turn that yellow color" and she wasn't sure if it was helping heal or not.  Today she rolls down her jeans to her white undergarments and shows me the kind of drainage from the wound - on her white clothes there are brown and yellow marks, with several rims/waves of area where the drainage has dried.  She has no pain around the wound.  When asking about other sx of potential illness, pt says she didn't feel sick or febrile, she has not had any sweats, h/c chills, sore throat, cough, URI sx, rash, fatigue, cough, abd pain, CP, N, V, D.  No sick contacts.  She denies any pain or swelling associated with dental cavity.    She says she thinks her adderral cause her temperature to be elevated.  She has no thermometer at home.        Patient Active Problem List   Diagnosis Date Noted   . Degeneration of lumbar or lumbosacral intervertebral disc 12/13/2014  . Anemia associated with nutritional deficiency 11/12/2014  . Bipolar affective disorder ([place]) 09/28/2014  . Anxiety state 09/28/2014  . S/P laparoscopic sleeve gastrectomy 01/2014 09/28/2014  . Abdominal wall seroma - chronic   . Seroma   . Cholelithiases 04/25/2013  . Cough 09/19/2011  . Smoker 09/19/2011  . GERD 03/04/2010  . RECTAL BLEEDING 03/04/2010  . DYSPHAGIA UNSPECIFIED 03/04/2010  . DYSPHAGIA 03/04/2010  . RUQ PAIN 03/04/2010     Prior to Admission medications   Medication Sig Start Date End Date Taking? Authorizing Provider  amphetamine-dextroamphetamine (ADDERALL) 15 MG tablet Take 15 mg by mouth 2 (two) times daily.   Yes [provider]  benztropine (COGENTIN) 2 MG tablet Take 2 mg by mouth at bedtime.   Yes [provider]  clonazePAM (KLONOPIN) 1 MG tablet Take 1 mg by mouth daily.   Yes [provider]  haloperidol (HALDOL) 5 MG tablet 1 tab in AM and 3 tab at [facility] 11/21/14  Yes [provider]  LORazepam (ATIVAN) 1 MG tablet Take 1 mg by mouth daily.   Yes [provider]  NUVIGIL 250 MG tablet Take 250 mg by mouth  daily. 11/14/14  Yes [provider]  QUEtiapine (SEROQUEL) 300 MG tablet 2 hs 11/21/14  Yes [provider]  valACYclovir (VALTREX) 500 MG tablet Take 1 tablet (500 mg total) by mouth daily as needed (fever blister). 03/21/18  Yes [place], Modena Nunnery, MD  VENTOLIN HFA 108 (90 Base) MCG/ACT inhaler INHALE 2 PUFFS INTO THE LUNGS EVERY 4 (FOUR) HOURS AS NEEDED FOR WHEEZING OR SHORTNESS OF BREATH. 04/11/18  Yes Delsa Grana, PA-C     Allergies  Allergen Reactions  . Nsaids      Family History  Problem Relation Age of Onset  . Asthma Maternal Grandmother      Social History   Socioeconomic History  . Marital status: Single    Spouse name: Not on file  . Number of children: 2  . Years of education: Not on file  .  Highest education level: Not on file  Occupational History  . Occupation: Insurance claims handler: UNEMPLOYED  Social Needs  . Financial resource strain: Not on file  . Food insecurity:    Worry: Not on file    Inability: Not on file  . Transportation needs:    Medical: Not on file    Non-medical: Not on file  Tobacco Use  . Smoking status: Current Every Day Smoker    Packs/day: 0.50    Years: 14.00    Pack years: 7.00    Types: E-cigarettes    Last attempt to quit: 12/20/2013    Years since quitting: 4.4  . Smokeless tobacco: Never Used  . Tobacco comment: Vapor cigarettes.  Substance and Sexual Activity  . Alcohol use: No    Alcohol/week: 0.0 standard drinks  . Drug use: No  . Sexual activity: Never    Birth control/protection: I.U.D.  Lifestyle  . Physical activity:    Days per week: Not on file    Minutes per session: Not on file  . Stress: Not on file  Relationships  . Social connections:    Talks on phone: Not on file    Gets together: Not on file    Attends religious service: Not on file    Active member of club or organization: Not on file    Attends meetings of clubs or organizations: Not on file    Relationship status: Not on file  . Intimate partner violence:    Fear of current or ex partner: Not on file    Emotionally abused: Not on file    Physically abused: Not on file    Forced sexual activity: Not on file  Other Topics Concern  . Not on file  Social History Narrative  . Not on file     Review of Systems  Constitutional: Negative.   HENT: Negative.   Eyes: Negative.   Respiratory: Negative.   Cardiovascular: Negative.   Gastrointestinal: Negative.   Endocrine: Negative.   Genitourinary: Negative.   Musculoskeletal: Negative.   Skin: Negative.   Allergic/Immunologic: Negative.   Neurological: Negative.   Hematological: Negative.   Psychiatric/Behavioral: Negative.   All other systems reviewed and are negative.      Objective:     Mask, gloves, eyewear worn throughout encounter with hand hygiene before and after  Vitals:   05/26/18 1156  BP: 120/80  Pulse: (!) 101  Resp: 16  Temp: 98.1 F (36.7 C)  SpO2: 97%  Weight: 271 lb 3.2 oz (123 kg)  Height: 5\' 8"  (1.727 m)      Physical Exam  Vitals signs and nursing note reviewed.  Constitutional:      General: She is not in acute distress.    Appearance: Normal appearance. She is well-developed. She is obese. She is not ill-appearing, toxic-appearing or diaphoretic.  HENT:     Head: Normocephalic and atraumatic.     Right Ear: Tympanic membrane, ear canal and external ear normal. There is no impacted cerumen.     Left Ear: Tympanic membrane, ear canal and external ear normal. There is no impacted cerumen.     Nose: Nose normal. No congestion or rhinorrhea.     Mouth/Throat:     Mouth: Mucous membranes are moist.     Pharynx: Oropharynx is clear. Uvula midline. No oropharyngeal exudate or posterior oropharyngeal erythema.  Eyes:     General: Lids are normal. No scleral icterus.       Right eye: No discharge.        Left eye: No discharge.     Conjunctiva/sclera: Conjunctivae normal.     Pupils: Pupils are equal, round, and reactive to light.  Neck:     Musculoskeletal: Normal range of motion and neck supple.     Trachea: Phonation normal. No tracheal deviation.  Cardiovascular:     Rate and Rhythm: Normal rate and regular rhythm.     Pulses: Normal pulses.          Radial pulses are 2+ on the right side and 2+ on the left side.       Posterior tibial pulses are 2+ on the right side and 2+ on the left side.     Heart sounds: Normal heart sounds. No murmur. No friction rub. No gallop.   Pulmonary:     Effort: Pulmonary effort is normal. No respiratory distress.     Breath sounds: Normal breath sounds. No stridor. No wheezing, rhonchi or rales.  Chest:     Chest wall: No tenderness.  Abdominal:     General: Bowel sounds are normal. There is no  distension.     Palpations: Abdomen is soft. There is no mass.     Tenderness: There is no abdominal tenderness. There is no right CVA tenderness, left CVA tenderness, guarding or rebound.     Hernia: No hernia is present.  Musculoskeletal: Normal range of motion.        General: No deformity.  Lymphadenopathy:     Cervical: No cervical adenopathy.  Skin:    General: Skin is warm.     Capillary Refill: Capillary refill takes less than 2 seconds.     Coloration: Skin is not jaundiced or pale.     Findings: Wound present. No rash.     Comments:  low abdomen, midline wound about 2cm wide and 6 cm long, with some scabbing areas, scattered areas of granulation tissue and remaining with soft light yellow tissue.  No active drainage, no bleeding, no purulence, no tenderness Wound culture swab obtained by me, labeled with 2 pt identifiers and wound location and later transported to lab  Neurological:     Mental Status: She is alert and oriented to person, place, and time.     Motor: No weakness or abnormal muscle tone.     Gait: Gait normal.  Psychiatric:        Mood and Affect: Mood normal.        Speech: Speech normal.        Behavior: Behavior normal.         Assessment & Plan:  Pt here with fever of unknown etiology?  No suspected source She has recurrent abdominal wound, very raw appearing, and dried drainage on clothing looks like it is likely mostly serosanguineous but there is some yellow to green tinge as well, no signs of surrounding cellulitis, wound culture taken, reviewed wound care with pt, can try non-adherent gauze and mupirocin for a few days until culture results to see if she requires oral abx.      ICD-10-CM   1. Wound dehiscence T81.30XA WOUND CULTURE  2. Chronic abdominal wound infection, sequela S31.109S WOUND CULTURE   L08.9   3. Fever, unspecified fever cause R50.9    reported to her at dentist today 100.7, afebrile here, no obvious source - COVID self  isolation precautions advised        Delsa Grana, PA-C 05/26/18 1:18 PM

## 2018-05-29 LAB — WOUND CULTURE
MICRO NUMBER:: 371279
SPECIMEN QUALITY:: ADEQUATE

## 2018-06-01 ENCOUNTER — Other Ambulatory Visit: Payer: Self-pay

## 2018-06-01 MED ORDER — CEPHALEXIN 500 MG PO CAPS: 500.0000 mg | ORAL_CAPSULE | Freq: Two times a day (BID) | ORAL | 0 refills | Status: DC

## 2018-06-01 MED ORDER — MUPIROCIN 2 % EX OINT: 1.0000 "application " | TOPICAL_OINTMENT | Freq: Two times a day (BID) | CUTANEOUS | 0 refills | Status: DC

## 2018-06-15 ENCOUNTER — Other Ambulatory Visit: Payer: Self-pay

## 2018-06-15 DIAGNOSIS — L03311 Cellulitis of abdominal wall: Secondary | ICD-10-CM

## 2018-06-15 MED ORDER — SULFAMETHOXAZOLE-TRIMETHOPRIM 800-160 MG PO TABS: 1.0000 | ORAL_TABLET | Freq: Two times a day (BID) | ORAL | 0 refills | Status: DC

## 2018-06-15 NOTE — Telephone Encounter (Signed)
Requested Prescriptions   Pending Prescriptions Disp Refills  . sulfamethoxazole-trimethoprim (BACTRIM DS) 800-160 MG tablet 20 tablet 0    Sig: Take 1 tablet by mouth 2 (two) times daily.   Last OV 05/26/2018  Pt called stating that the keflex is not working and would like a refill for Bactrim because it has worked in the past for her wound. Please advise.

## 2018-08-16 DIAGNOSIS — F4010 Social phobia, unspecified: Secondary | ICD-10-CM | POA: Diagnosis not present

## 2018-08-16 DIAGNOSIS — F319 Bipolar disorder, unspecified: Secondary | ICD-10-CM | POA: Diagnosis not present

## 2018-09-05 ENCOUNTER — Other Ambulatory Visit: Payer: Self-pay | Admitting: Family Medicine

## 2018-09-08 ENCOUNTER — Encounter (HOSPITAL_COMMUNITY): Payer: Self-pay | Admitting: Emergency Medicine

## 2018-09-08 ENCOUNTER — Emergency Department (HOSPITAL_COMMUNITY): Payer: Medicare Other

## 2018-09-08 ENCOUNTER — Other Ambulatory Visit: Payer: Self-pay

## 2018-09-08 ENCOUNTER — Ambulatory Visit: Payer: Medicare Other | Admitting: Family Medicine

## 2018-09-08 ENCOUNTER — Emergency Department (HOSPITAL_COMMUNITY)
Admission: EM | Admit: 2018-09-08 | Discharge: 2018-09-08 | Disposition: A | Discharge status: Home / Self Care | Payer: Medicare Other | Attending: Emergency Medicine | Admitting: Emergency Medicine

## 2018-09-08 DIAGNOSIS — W010XXA Fall on same level from slipping, tripping and stumbling without subsequent striking against object, initial encounter: Secondary | ICD-10-CM | POA: Diagnosis not present

## 2018-09-08 DIAGNOSIS — Z79899 Other long term (current) drug therapy: Secondary | ICD-10-CM | POA: Insufficient documentation

## 2018-09-08 DIAGNOSIS — F17290 Nicotine dependence, other tobacco product, uncomplicated: Secondary | ICD-10-CM | POA: Diagnosis not present

## 2018-09-08 DIAGNOSIS — F17210 Nicotine dependence, cigarettes, uncomplicated: Secondary | ICD-10-CM | POA: Diagnosis not present

## 2018-09-08 DIAGNOSIS — S0990XA Unspecified injury of head, initial encounter: Secondary | ICD-10-CM

## 2018-09-08 DIAGNOSIS — M545 Low back pain: Secondary | ICD-10-CM | POA: Insufficient documentation

## 2018-09-08 DIAGNOSIS — Y92017 Garden or yard in single-family (private) house as the place of occurrence of the external cause: Secondary | ICD-10-CM | POA: Insufficient documentation

## 2018-09-08 DIAGNOSIS — R350 Frequency of micturition: Secondary | ICD-10-CM | POA: Insufficient documentation

## 2018-09-08 DIAGNOSIS — Y9301 Activity, walking, marching and hiking: Secondary | ICD-10-CM | POA: Diagnosis not present

## 2018-09-08 DIAGNOSIS — N3000 Acute cystitis without hematuria: Secondary | ICD-10-CM | POA: Insufficient documentation

## 2018-09-08 DIAGNOSIS — J45909 Unspecified asthma, uncomplicated: Secondary | ICD-10-CM | POA: Insufficient documentation

## 2018-09-08 DIAGNOSIS — Y998 Other external cause status: Secondary | ICD-10-CM | POA: Diagnosis not present

## 2018-09-08 DIAGNOSIS — I639 Cerebral infarction, unspecified: Secondary | ICD-10-CM | POA: Insufficient documentation

## 2018-09-08 LAB — URINALYSIS, ROUTINE W REFLEX MICROSCOPIC
Bilirubin Urine: NEGATIVE
Glucose, UA: NEGATIVE mg/dL
Hgb urine dipstick: NEGATIVE
Ketones, ur: NEGATIVE mg/dL
Leukocytes,Ua: NEGATIVE
Nitrite: POSITIVE — AB
Protein, ur: 30 mg/dL — AB
Specific Gravity, Urine: 1.026 (ref 1.005–1.030)
pH: 5 (ref 5.0–8.0)

## 2018-09-08 LAB — POC URINE PREG, ED: Preg Test, Ur: NEGATIVE

## 2018-09-08 MED ORDER — CEPHALEXIN 500 MG PO CAPS
500.0000 mg | ORAL_CAPSULE | Freq: Once | ORAL | Status: AC
  Administered 2018-09-08: 500 mg via ORAL
  Filled 2018-09-08: qty 1

## 2018-09-08 MED ORDER — ACETAMINOPHEN 500 MG PO TABS
1000.0000 mg | ORAL_TABLET | Freq: Once | ORAL | Status: DC
  Filled 2018-09-08: qty 2

## 2018-09-08 MED ORDER — ASPIRIN EC 325 MG PO TBEC: 325.0000 mg | DELAYED_RELEASE_TABLET | Freq: Every day | ORAL | 0 refills | Status: DC

## 2018-09-08 MED ORDER — CEPHALEXIN 500 MG PO CAPS: 500.0000 mg | ORAL_CAPSULE | Freq: Two times a day (BID) | ORAL | 0 refills | Status: DC

## 2018-09-08 NOTE — ED Triage Notes (Signed)
Pt states that she fell and hit the back of her head and upper back. She denies loc.

## 2018-09-08 NOTE — ED Notes (Signed)
Patient transported to CT 

## 2018-09-08 NOTE — Discharge Instructions (Signed)
As discussed you have no acute injuries to your brain or skull with today's fall.  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  You will need to follow-up with Dr. Merlene Laughter for further evaluation of this finding and to help reduce your risk of having additional strokes.  He has asked that she call his office to arrange an appointment.  In the meantime you need to take a daily aspirin as was prescribed which will help protect you from another stroke.  Also, you do have a UTI and have been prescribed antibiotics for this.  Take this medication until completed.  Make sure you are drinking plenty of fluids.  Get rechecked by your primary doctor for any persistent symptoms or if you develop fevers, vomiting or worsening urinary symptoms.

## 2018-09-08 NOTE — ED Provider Notes (Addendum)
[facility] EMERGENCY DEPARTMENT Provider Note   CSN: [id number] Arrival date & time: 09/08/18  1105    History   Chief Complaint Chief Complaint  Patient presents with  . Fall    HPI [name] is a 43 y.o. female with a history as outlined below, most significant for asthma, anxiety, anemia, GERD, degenerative disc disease of the lumbar spine, and a distant history of substance abuse presenting with a fall which occurred just prior to arrival.  She was outdoors when her young daughter who was playing with a garden hose with the pavement where the patient was walking, she slipped and fell backwards landing directly onto pavement with her posterior head.  She denies LOC, she does have a moderate headache, denies vision changes, nausea, vomiting or dizziness.  She also denies any focal weakness and denies any other complaints of pain including her neck back pelvis or hips.  She has had no treatment prior to arrival however took a klonopin here before evaluation which she states she takes when her blood pressure is elevated.  She also states that she has a scheduled ED visit with her PCP this afternoon secondary to a several day history of urinary frequency and lower bilateral aching in her back with suspected possible UTI.  She denies fevers or chills, has had no hematuria.  She has taken Pyridium for the past 2 days.     The history is provided by the patient.    Past Medical History:  Diagnosis Date  . Anemia   . Anxiety   . Arthritis   . Asthma   . Bipolar affective ([place])   . Blood transfusion without reported diagnosis   . Colostomy in place [facility])   . Depression   . Diverticulitis 2008   perforated/ requiring resection  . Family history of adverse reaction to anesthesia    MGM was placed on vent after surgery  . GERD (gastroesophageal reflux disease)   . Headache   . Shortness of breath dyspnea   . Sleep apnea    mild, does not use c-pap machine  . Substance abuse  ([place])    12 years ago-crack cocaine    Patient Active Problem List   Diagnosis Date Noted  . Degeneration of lumbar or lumbosacral intervertebral disc 12/13/2014  . Anemia associated with nutritional deficiency 11/12/2014  . Bipolar affective disorder ([place]) 09/28/2014  . Anxiety state 09/28/2014  . S/P laparoscopic sleeve gastrectomy 01/2014 09/28/2014  . Abdominal wall seroma - chronic   . Seroma   . Cholelithiases 04/25/2013  . Cough 09/19/2011  . Smoker 09/19/2011  . GERD 03/04/2010  . RECTAL BLEEDING 03/04/2010  . DYSPHAGIA UNSPECIFIED 03/04/2010  . DYSPHAGIA 03/04/2010  . RUQ PAIN 03/04/2010    Past Surgical History:  Procedure Laterality Date  . Abdominal wall hernia  07/2007   open repair with lysis of adhesions  . COLONOSCOPY  03/13/2010   TZG:YFVCBSWH hemorrhoids likely cause of hematochezia, otherwise normal  . Colostomy reversal  11/2006  . ESOPHAGOGASTRODUODENOSCOPY  03/13/2010   QPR:[id number] esophagus, status post passage of a Maloney dilator/Small hiatal hernia/ Antral erosions, status post biopsy  . EXAMINATION UNDER ANESTHESIA N/A 05/11/2012   Procedure: EXAM UNDER ANESTHESIA;  Surgeon: Donato Heinz, MD;  Location: [facility] ORS;  Service: General;  Laterality: N/A;  . Exploratory laparotomy with resection  2008  . FINGER CLOSED REDUCTION Right 08/05/2012   Procedure: CLOSED REDUCTION RIGHT THUMB (FINGER);  Surgeon: Linna Hoff, MD;  Location: [facility]  OR;  Service: Orthopedics;  Laterality: Right;  . HERNIA REPAIR    . Hx of abd wall seroma  08/2007   Drained via [place] in [place], [place]  . Hx of abd wall seroma  10/2007   Drained by Dr. Geroge Baseman in office  . Kidney stones    . LAPAROSCOPIC GASTRIC SLEEVE RESECTION N/A 02/13/2014   Procedure: LAPAROSCOPIC GASTRIC SLEEVE RESECTION LYSIS OF ADHESIONS, UPPER ENDOSCOPY;  Surgeon: [name], MD;  Location: [facility] ORS;  Service: General;  Laterality: N/A;  . LAPAROSCOPY N/A 09/28/2014   Procedure: LAPAROSCOPY DIAGNOSTIC,  INCISION AND DRAINAGE WITH LAPAROSCOPIC EXPLORATION OF ABDOMINAL WALL SEROMA with ultrasound;  Surgeon: Greer Pickerel, MD;  Location: [facility] ORS;  Service: General;  Laterality: N/A;  . PLACEMENT OF SETON N/A 05/11/2012   Procedure: PLACEMENT OF SETON;  Surgeon: Donato Heinz, MD;  Location: [facility] ORS;  Service: General;  Laterality: N/A;  . root canal 7-16    . TONSILLECTOMY    . TREATMENT FISTULA ANAL       OB History   No obstetric history on file.      Home Medications    Prior to Admission medications   Medication Sig Start Date End Date Taking? Authorizing Provider  amphetamine-dextroamphetamine (ADDERALL) 15 MG tablet Take 15 mg by mouth 2 (two) times daily.   Yes [provider]  benztropine (COGENTIN) 2 MG tablet Take 2 mg by mouth at bedtime.   Yes [provider]  clonazePAM (KLONOPIN) 1 MG tablet Take 2 mg by mouth daily.    Yes [provider]  haloperidol (HALDOL) 5 MG tablet 1 tab in AM and 3 tab at [facility] 11/21/14  Yes [provider]  LORazepam (ATIVAN) 1 MG tablet Take 1 mg by mouth daily.   Yes [provider]  mupirocin ointment (BACTROBAN) 2 % Place 1 application into the nose 2 (two) times daily. 06/01/18  Yes Delsa Grana, PA-C  NUVIGIL 250 MG tablet Take 250 mg by mouth daily. 11/14/14  Yes [provider]  QUEtiapine (SEROQUEL) 300 MG tablet Take 600 mg by mouth at bedtime.  11/21/14  Yes [provider]  valACYclovir (VALTREX) 500 MG tablet TAKE 1 TABLET (500 MG TOTAL) BY MOUTH DAILY AS NEEDED (FEVER BLISTER). 09/05/18  Yes Delsa Grana, PA-C  VENTOLIN HFA 108 (90 Base) MCG/ACT inhaler INHALE 2 PUFFS INTO THE LUNGS EVERY 4 (FOUR) HOURS AS NEEDED FOR WHEEZING OR SHORTNESS OF BREATH. 04/11/18  Yes Delsa Grana, PA-C  aspirin EC 325 MG tablet Take 1 tablet (325 mg total) by mouth daily. 09/08/18   Febe Champa, Almyra Free, PA-C  cephALEXin (KEFLEX) 500 MG capsule Take 1 capsule (500 mg total) by mouth 2 (two) times daily. 09/08/18    Evalee Jefferson, PA-C  sulfamethoxazole-trimethoprim (BACTRIM DS) 800-160 MG tablet Take 1 tablet by mouth 2 (two) times daily. Patient not taking: Reported on 09/08/2018 06/15/18   Delsa Grana, PA-C    Family History Family History  Problem Relation Age of Onset  . Asthma Maternal Grandmother     Social History Social History   Tobacco Use  . Smoking status: Current Every Day Smoker    Packs/day: 0.50    Years: 14.00    Pack years: 7.00    Types: E-cigarettes    Last attempt to quit: 12/20/2013    Years since quitting: 4.7  . Smokeless tobacco: Never Used  . Tobacco comment: Vapor cigarettes.  Substance Use Topics  . Alcohol use: No    Alcohol/week:  0.0 standard drinks  . Drug use: No     Allergies   Patient has no active allergies.   Review of Systems Review of Systems  Constitutional: Negative for fever.  HENT: Negative for congestion and sore throat.   Eyes: Negative.  Negative for visual disturbance.  Respiratory: Negative for chest tightness and shortness of breath.   Cardiovascular: Negative for chest pain.  Gastrointestinal: Negative for abdominal pain and nausea.  Genitourinary: Positive for dysuria and frequency. Negative for hematuria.  Musculoskeletal: Negative for arthralgias, joint swelling and neck pain.  Skin: Negative.  Negative for rash and wound.  Neurological: Positive for headaches. Negative for dizziness, weakness, light-headedness and numbness.  Psychiatric/Behavioral: Negative.      Physical Exam Updated Vital Signs BP (!) 158/94 (BP Location: Left Arm)   Pulse 99   Temp 98.2 F (36.8 C) (Oral)   Resp 19   LMP 09/07/2018   SpO2 99%   Physical Exam Vitals signs and nursing note reviewed.  Constitutional:      Appearance: She is well-developed.  HENT:     Head: Normocephalic. Contusion present. No raccoon eyes or Battle's sign.      Comments: Tender to palpation with a small hematoma at the parietal occipital region.  Skin is intact  without abrasion or laceration.    Right Ear: No hemotympanum.     Left Ear: No hemotympanum.     Mouth/Throat:     Mouth: Mucous membranes are moist.  Eyes:     Extraocular Movements: Extraocular movements intact.     Conjunctiva/sclera: Conjunctivae normal.     Pupils: Pupils are equal, round, and reactive to light.  Neck:     Musculoskeletal: Normal range of motion. No neck rigidity or muscular tenderness.  Cardiovascular:     Rate and Rhythm: Regular rhythm. Tachycardia present.     Heart sounds: Normal heart sounds.  Pulmonary:     Effort: Pulmonary effort is normal.     Breath sounds: Normal breath sounds. No wheezing.  Musculoskeletal: Normal range of motion.  Skin:    General: Skin is warm and dry.  Neurological:     General: No focal deficit present.     Mental Status: She is alert and oriented to person, place, and time.     GCS: GCS eye subscore is 4. GCS verbal subscore is 5. GCS motor subscore is 6.     Cranial Nerves: Cranial nerves are intact.     Sensory: Sensation is intact.     Motor: Motor function is intact. No pronator drift.     Coordination: Coordination is intact. Finger-Nose-Finger Test normal.     Gait: Gait is intact.     Comments: Equal grip strength      ED Treatments / Results  Labs (all labs ordered are listed, but only abnormal results are displayed) Labs Reviewed  URINALYSIS, ROUTINE W REFLEX MICROSCOPIC - Abnormal; Notable for the following components:      Result Value   Color, Urine AMBER (*)    APPearance HAZY (*)    Protein, ur 30 (*)    Nitrite POSITIVE (*)    Bacteria, UA RARE (*)    All other components within normal limits  URINE CULTURE  POC URINE PREG, ED    EKG EKG Interpretation  Date/Time:  Thursday September 08 2018 15:26:32 EDT Ventricular Rate:  103 PR Interval:    QRS Duration: 91 QT Interval:  412 QTC Calculation: 540 R Axis:   86 Text Interpretation:  Sinus tachycardia Consider left atrial enlargement Low  voltage, precordial leads Prolonged QT interval Confirmed by Veryl Speak [phone]) on 09/08/2018 3:37:16 PM   Radiology Ct Head Wo Contrast  Result Date: 09/08/2018 CLINICAL DATA:  Head trauma, minor, GCS greater than or equal to 13, high clinical risk, initial exam EXAM: CT HEAD WITHOUT CONTRAST TECHNIQUE: Contiguous axial images were obtained from the base of the skull through the vertex without intravenous contrast. COMPARISON:  Neck CT June 12, 2011 FINDINGS: Brain: There is no acute intracranial hemorrhage. No intracranial mass, midline shift or extra-axial collection. Moderate-sized chronic infarct within the inferior left cerebellum. Vascular: Mild atherosclerotic calcification of the carotid artery siphons. Skull: Normal. Negative for fracture or focal lesion. Question small parietooccipital scalp hematoma. Sinuses/Orbits: Small air-fluid level within the right sphenoid sinus. The mastoid air cells are well aerated. IMPRESSION: No acute intracranial abnormality. Moderate-sized chronic left cerebellar infarct. Electronically Signed   By: Kellie Simmering   On: 09/08/2018 14:52    Procedures Procedures (including critical care time)  Medications Ordered in ED Medications  acetaminophen (TYLENOL) tablet 1,000 mg (1,000 mg Oral Refused 09/08/18 1518)  cephALEXin (KEFLEX) capsule 500 mg (500 mg Oral Given 09/08/18 1518)     Initial Impression / Assessment and Plan / ED Course  I have reviewed the triage vital signs and the nursing notes.  Pertinent labs & imaging results that were available during my care of the patient were reviewed by me and considered in my medical decision making (see chart for details).        Patient presents with a head injury, CT is negative for any acute event, but does show a chronic old cerebellar stroke.  This was discussed with the patient in detail and she denies having any prior history of symptoms that would suggest this diagnosis.  Discussed case with Dr.  Merlene Laughter who recommends a daily antiplatelet, patient was placed on aspirin 325 mg daily.  Patient advised to call his office for an appointment time for further evaluation and management of this finding.  An EKG was completed here to confirm no arrhythmia, specifically no atrial fibrillation.  Acute cystitis, urine culture sent, Keflex started.  Strict return precautions were discussed.  Patient was also given information about minor head injuries.  She has no acute symptoms or suggestion of concussion.  She is awake, alert, no confusion, no nausea or vomiting during her ED visit here.  The patient appears reasonably screened and/or stabilized for discharge and I doubt any other medical condition or other [facility] requiring further screening, evaluation, or treatment in the ED at this time prior to discharge.  Final Clinical Impressions(s) / ED Diagnoses   Final diagnoses:  Cerebellar stroke ([place])  Acute cystitis without hematuria  Minor head injury, initial encounter    ED Discharge Orders         Ordered    cephALEXin (KEFLEX) 500 MG capsule  2 times daily     09/08/18 1534    aspirin EC 325 MG tablet  Daily     09/08/18 1534           Evalee Jefferson, PA-C 09/08/18 1643    Evalee Jefferson, PA-C 09/08/18 1644    Milton Ferguson, MD 09/10/18 1523

## 2018-09-09 ENCOUNTER — Ambulatory Visit (INDEPENDENT_AMBULATORY_CARE_PROVIDER_SITE_OTHER): Payer: Medicare Other | Admitting: Family Medicine

## 2018-09-09 VITALS — BP 154/90 | HR 114 | Temp 98.0°F | Resp 18 | Ht 68.0 in | Wt 278.4 lb

## 2018-09-09 DIAGNOSIS — J209 Acute bronchitis, unspecified: Secondary | ICD-10-CM | POA: Diagnosis not present

## 2018-09-09 DIAGNOSIS — Z09 Encounter for follow-up examination after completed treatment for conditions other than malignant neoplasm: Secondary | ICD-10-CM

## 2018-09-09 DIAGNOSIS — S0990XA Unspecified injury of head, initial encounter: Secondary | ICD-10-CM

## 2018-09-09 DIAGNOSIS — S0990XD Unspecified injury of head, subsequent encounter: Secondary | ICD-10-CM

## 2018-09-09 DIAGNOSIS — Z8673 Personal history of transient ischemic attack (TIA), and cerebral infarction without residual deficits: Secondary | ICD-10-CM | POA: Diagnosis not present

## 2018-09-09 MED ORDER — ONDANSETRON 4 MG PO TBDP: 4.0000 mg | ORAL_TABLET | Freq: Three times a day (TID) | ORAL | 0 refills | Status: DC | PRN

## 2018-09-09 MED ORDER — ALBUTEROL SULFATE HFA 108 (90 BASE) MCG/ACT IN AERS: INHALATION_SPRAY | RESPIRATORY_TRACT | 2 refills | Status: DC

## 2018-09-09 NOTE — Progress Notes (Signed)
Patient ID: [name], female    DOB: 1975/08/22, 43 y.o.   MRN: [id number]  PCP: Delsa Grana, PA-C  Chief Complaint  Patient presents with  . Hospitalization Follow-up    head injury, cerebellar stroke, acute cystitis wi/ hematuria  . Referral    to Dr. Phillips Odor, MD stat    Subjective:   [name] is a 43 y.o. female, presents to clinic with CC of closed head injury with subsequent HA.  She accidentally fell over a hose fell back onto concrete floor and hit the back of her head.  She did go to the ER for evaluation and CT showed old cerebellar stroke and she was further referred to neurology.  She had no prior diagnosis of stroke or known strokelike symptoms.  She today asked for a referral to neurology because her insurance requires it.  She does state that more than 10 years ago she was smoking crack cocaine.  Never had a history of balance or gait disturbances, facial droop, syncopal episodes.   She does continue to have headache and the back of her head and her occiput area is tender to the touch and sore.  She denies any dizziness, visual disturbances, photophobia, nausea, vomiting, difficulty sleeping, excessive sleepiness, confusion, neck pain.  ER visit from 09/08/2018 and all results and imaging reviewed.    Patient Active Problem List   Diagnosis Date Noted  . Degeneration of lumbar or lumbosacral intervertebral disc 12/13/2014  . Anemia associated with nutritional deficiency 11/12/2014  . Bipolar affective disorder ([place]) 09/28/2014  . Anxiety state 09/28/2014  . S/P laparoscopic sleeve gastrectomy 01/2014 09/28/2014  . Abdominal wall seroma - chronic   . Seroma   . Cholelithiases 04/25/2013  . Cough 09/19/2011  . Smoker 09/19/2011  . GERD 03/04/2010  . RECTAL BLEEDING 03/04/2010  . DYSPHAGIA UNSPECIFIED 03/04/2010  . DYSPHAGIA 03/04/2010  . RUQ PAIN 03/04/2010     Prior to Admission medications   Medication Sig Start Date End Date Taking?  Authorizing Provider  amphetamine-dextroamphetamine (ADDERALL) 15 MG tablet Take 15 mg by mouth 2 (two) times daily.   Yes [provider]  aspirin EC 325 MG tablet Take 1 tablet (325 mg total) by mouth daily. 09/08/18  Yes Idol, Almyra Free, PA-C  benztropine (COGENTIN) 2 MG tablet Take 2 mg by mouth at bedtime.   Yes [provider]  cephALEXin (KEFLEX) 500 MG capsule Take 1 capsule (500 mg total) by mouth 2 (two) times daily. 09/08/18  Yes Idol, Almyra Free, PA-C  haloperidol (HALDOL) 5 MG tablet 1 tab in AM and 3 tab at [facility] 11/21/14  Yes [provider]  LORazepam (ATIVAN) 1 MG tablet Take 1 mg by mouth daily.   Yes [provider]  mupirocin ointment (BACTROBAN) 2 % Place 1 application into the nose 2 (two) times daily. 06/01/18  Yes Delsa Grana, PA-C  NUVIGIL 250 MG tablet Take 250 mg by mouth daily. 11/14/14  Yes [provider]  QUEtiapine (SEROQUEL) 300 MG tablet Take 600 mg by mouth at bedtime.  11/21/14  Yes [provider]  valACYclovir (VALTREX) 500 MG tablet TAKE 1 TABLET (500 MG TOTAL) BY MOUTH DAILY AS NEEDED (FEVER BLISTER). 09/05/18  Yes Mardell Suttles, PA-C  VENTOLIN HFA 108 (90 Base) MCG/ACT inhaler INHALE 2 PUFFS INTO THE LUNGS EVERY 4 (FOUR) HOURS AS NEEDED FOR WHEEZING OR SHORTNESS OF BREATH. 04/11/18  Yes Delsa Grana, PA-C  clonazePAM (KLONOPIN) 1 MG tablet Take 2 mg by  mouth daily.     [provider]     No Active Allergies   Family History  Problem Relation Age of Onset  . Asthma Maternal Grandmother      Social History   Socioeconomic History  . Marital status: Single    Spouse name: Not on file  . Number of children: 2  . Years of education: Not on file  . Highest education level: Not on file  Occupational History  . Occupation: Insurance claims handler: UNEMPLOYED  Social Needs  . Financial resource strain: Not on file  . Food insecurity    Worry: Not on file    Inability: Not on file  . Transportation  needs    Medical: Not on file    Non-medical: Not on file  Tobacco Use  . Smoking status: Current Every Day Smoker    Packs/day: 0.50    Years: 14.00    Pack years: 7.00    Types: E-cigarettes    Last attempt to quit: 12/20/2013    Years since quitting: 4.7  . Smokeless tobacco: Never Used  . Tobacco comment: Vapor cigarettes.  Substance and Sexual Activity  . Alcohol use: No    Alcohol/week: 0.0 standard drinks  . Drug use: No  . Sexual activity: Never    Birth control/protection: I.U.D.  Lifestyle  . Physical activity    Days per week: Not on file    Minutes per session: Not on file  . Stress: Not on file  Relationships  . Social Herbalist on phone: Not on file    Gets together: Not on file    Attends religious service: Not on file    Active member of club or organization: Not on file    Attends meetings of clubs or organizations: Not on file    Relationship status: Not on file  . Intimate partner violence    Fear of current or ex partner: Not on file    Emotionally abused: Not on file    Physically abused: Not on file    Forced sexual activity: Not on file  Other Topics Concern  . Not on file  Social History Narrative  . Not on file     Review of Systems  Constitutional: Negative.   HENT: Negative.   Eyes: Negative.   Respiratory: Negative.   Cardiovascular: Negative.   Gastrointestinal: Negative.   Endocrine: Negative.   Genitourinary: Negative.  Negative for dysuria.  Musculoskeletal: Negative.   Skin: Negative.   Allergic/Immunologic: Negative.   Neurological: Negative.   Hematological: Negative.   Psychiatric/Behavioral: Negative.   All other systems reviewed and are negative.      Objective:    Vitals:   09/09/18 1409  BP: (!) 154/90  Pulse: (!) 114  Resp: 18  Temp: 98 F (36.7 C)  SpO2: 96%  Weight: 278 lb 6.4 oz (126.3 kg)  Height: 5\' 8"  (1.727 m)      Physical Exam Vitals signs and nursing note reviewed.   Constitutional:      General: She is not in acute distress.    Appearance: Normal appearance. She is well-developed and well-groomed. She is morbidly obese. She is not ill-appearing, toxic-appearing or diaphoretic.  HENT:     Head: Normocephalic. Contusion present. No raccoon eyes, Battle's sign, abrasion, right periorbital erythema, left periorbital erythema or laceration. Hair is normal.     Jaw: There is normal jaw occlusion.     Comments: ttp to  occiput of head, no step off palpated, scalp intact, no bogginess    Right Ear: Hearing, tympanic membrane, ear canal and external ear normal.     Left Ear: Hearing, tympanic membrane, ear canal and external ear normal.     Nose: Nose normal.     Mouth/Throat:     Mouth: Mucous membranes are dry.     Pharynx: Oropharynx is clear.  Eyes:     General: Lids are normal. Lids are everted, no foreign bodies appreciated. No visual field deficit.       Right eye: No discharge.        Left eye: No discharge.     Extraocular Movements: Extraocular movements intact.     Right eye: Normal extraocular motion and no nystagmus.     Left eye: Normal extraocular motion and no nystagmus.     Conjunctiva/sclera: Conjunctivae normal.  Neck:     Musculoskeletal: Full passive range of motion without pain, normal range of motion and neck supple. Normal range of motion. No neck rigidity, crepitus, injury, pain with movement, spinous process tenderness or muscular tenderness.     Trachea: No tracheal deviation.  Cardiovascular:     Rate and Rhythm: Normal rate and regular rhythm.  Pulmonary:     Effort: Pulmonary effort is normal. No respiratory distress.     Breath sounds: No stridor.  Musculoskeletal: Normal range of motion.  Skin:    General: Skin is warm and dry.     Findings: No rash.  Neurological:     General: No focal deficit present.     Mental Status: She is alert and oriented to person, place, and time. Mental status is at baseline.     Cranial  Nerves: Cranial nerves are intact. No cranial nerve deficit or facial asymmetry.     Sensory: Sensation is intact.     Motor: Motor function is intact. No abnormal muscle tone.     Coordination: Coordination is intact. Romberg sign negative. Coordination normal. Finger-Nose-Finger Test normal. Rapid alternating movements normal.     Gait: Gait is intact.     Comments: MENTAL STATUS: AAOx3, memory intact, fund of knowledge appropriate  LANG/SPEECH: Naming and repetition intact, fluent, follows 3-step commands  CRANIAL NERVES:   II: Pupils equal and reactive, no RAPD, no VF deficits   III, IV, VI: EOM intact, no gaze preference or deviation, no nystagmus.   V: normal sensation in V1, V2, and V3 segments bilaterally   VII: no asymmetry, no nasolabial fold flattening   VIII: normal hearing to speech   IX, X: normal palatal elevation, no uvular deviation   XI: 5/5 head turn and 5/5 shoulder shrug bilaterally   XII: midline tongue protrusion  MOTOR:  5/5 bilateral grip strength 5/5 strength dorsiflexion/plantarflexion b/l  SENSORY:  Normal to light touch Romberg absent  COORD: Normal finger to nose and heel to shin, no tremor, no dysmetria  STATION: normal stance, no truncal ataxia  GAIT: Normal; patient able to tip-toe, heel-walk.   Psychiatric:        Behavior: Behavior normal. Behavior is cooperative.           Assessment & Plan:      ICD-10-CM   1. Closed head injury, subsequent encounter  S09.90XD Ambulatory referral to Neurology   some HA, but not other sx, advised brain rest on days with worsening sx, avoid possibility of another head injury, can f/up with neuro or here if sx prolonged  2. Acute bronchitis, unspecified organism  J20.9 albuterol (VENTOLIN HFA) 108 (90 Base) MCG/ACT inhaler   pt asked for refill on inhaler, no current sx (med refill pulled in diagnosis with it)  3. History of cerebellar stroke  Z86.73 Ambulatory referral to Neurology   found  incidentally on head CT after closed head trauma, likely from past cocaine use, no neuro deficit, f/up with neurology  4. Encounter for examination following treatment at hospital  Z09    records reviewed and discussed with pt       Delsa Grana, PA-C 09/09/18 2:14 PM

## 2018-09-10 LAB — URINE CULTURE
Culture: <10000 — AB
Special Requests: NORMAL

## 2018-09-13 ENCOUNTER — Encounter: Payer: Self-pay | Admitting: Family Medicine

## 2018-09-13 ENCOUNTER — Other Ambulatory Visit: Payer: Self-pay | Admitting: Family Medicine

## 2018-09-13 NOTE — Addendum Note (Signed)
Addended by: Delsa Grana on: 09/13/2018 09:23 PM   Modules accepted: Orders

## 2018-09-15 ENCOUNTER — Other Ambulatory Visit: Payer: Self-pay | Admitting: Family Medicine

## 2018-09-15 ENCOUNTER — Telehealth: Payer: Self-pay | Admitting: Family Medicine

## 2018-09-15 MED ORDER — SULFAMETHOXAZOLE-TRIMETHOPRIM 800-160 MG PO TABS: 1.0000 | ORAL_TABLET | Freq: Two times a day (BID) | ORAL | 0 refills | Status: DC

## 2018-09-15 NOTE — Telephone Encounter (Signed)
Patient called in stating that she was seen at the ER on 09/08/2018 after a fall in which she also had UTI symptoms. States she was given Keflex at the ER for UTI and has completed it, however she is still experiencing symptoms of UTI. She saw Leisa on 09/09/2018 and told that she would call in another round of antibiotics but states nothing was called in. Please advise?

## 2018-09-15 NOTE — Telephone Encounter (Signed)
Spoke with patient and informed her that medication was sent in. Patient verbalized understanding.

## 2018-09-15 NOTE — Telephone Encounter (Signed)
I sent in bactrim.

## 2018-09-15 NOTE — Telephone Encounter (Signed)
Left message return call

## 2018-09-20 DIAGNOSIS — G4733 Obstructive sleep apnea (adult) (pediatric): Secondary | ICD-10-CM | POA: Diagnosis not present

## 2018-09-20 DIAGNOSIS — G4459 Other complicated headache syndrome: Secondary | ICD-10-CM | POA: Diagnosis not present

## 2018-09-20 DIAGNOSIS — I6930 Unspecified sequelae of cerebral infarction: Secondary | ICD-10-CM | POA: Diagnosis not present

## 2018-09-20 DIAGNOSIS — E662 Morbid (severe) obesity with alveolar hypoventilation: Secondary | ICD-10-CM | POA: Diagnosis not present

## 2018-09-30 ENCOUNTER — Telehealth: Payer: Self-pay | Admitting: Family Medicine

## 2018-09-30 NOTE — Telephone Encounter (Addendum)
[facility]  Patient calling to say she saw leisa for a fall she had, wants to change her pcp to dr pickard, now is saying when she fell she thinks she bruised her tailbone, would like to know if she can get gabapentin prescribed for this if possible  [phone] PATIENT CALLED AND SAID THIS GOT CALLED IN BY ANOTHER PROVIDER

## 2018-10-03 NOTE — Telephone Encounter (Signed)
Gabapentin is a controlled substance now and would require an ov to evaluate.

## 2018-10-03 NOTE — Telephone Encounter (Signed)
Patient aware of providers recommendations via vm 

## 2018-10-07 ENCOUNTER — Telehealth: Payer: Self-pay | Admitting: Family Medicine

## 2018-10-07 MED ORDER — ONDANSETRON 4 MG PO TBDP: 4.0000 mg | ORAL_TABLET | Freq: Three times a day (TID) | ORAL | 0 refills | Status: DC | PRN

## 2018-10-07 NOTE — Telephone Encounter (Signed)
Refill on zofran to [facility]

## 2018-10-07 NOTE — Telephone Encounter (Signed)
Medication called/sent to requested pharmacy  

## 2018-10-10 ENCOUNTER — Other Ambulatory Visit (HOSPITAL_BASED_OUTPATIENT_CLINIC_OR_DEPARTMENT_OTHER): Payer: Self-pay

## 2018-10-10 DIAGNOSIS — G4733 Obstructive sleep apnea (adult) (pediatric): Secondary | ICD-10-CM

## 2018-10-14 ENCOUNTER — Other Ambulatory Visit (HOSPITAL_BASED_OUTPATIENT_CLINIC_OR_DEPARTMENT_OTHER): Payer: Self-pay

## 2018-10-17 ENCOUNTER — Other Ambulatory Visit (HOSPITAL_COMMUNITY)
Admission: RE | Admit: 2018-10-17 | Discharge: 2018-10-17 | Disposition: A | Discharge status: Home / Self Care | Payer: Medicare Other | Source: Ambulatory Visit | Attending: Neurology | Admitting: Neurology

## 2018-10-17 ENCOUNTER — Other Ambulatory Visit: Payer: Self-pay

## 2018-10-17 DIAGNOSIS — Z20828 Contact with and (suspected) exposure to other viral communicable diseases: Secondary | ICD-10-CM | POA: Diagnosis not present

## 2018-10-17 DIAGNOSIS — Z01812 Encounter for preprocedural laboratory examination: Secondary | ICD-10-CM | POA: Diagnosis not present

## 2018-10-17 LAB — SARS CORONAVIRUS 2 (TAT 6-24 HRS): SARS Coronavirus 2: NEGATIVE

## 2018-10-20 ENCOUNTER — Other Ambulatory Visit: Payer: Self-pay

## 2018-10-20 ENCOUNTER — Ambulatory Visit: Payer: Medicare Other | Attending: Neurology | Admitting: Neurology

## 2018-10-20 DIAGNOSIS — Z79899 Other long term (current) drug therapy: Secondary | ICD-10-CM | POA: Diagnosis not present

## 2018-10-20 DIAGNOSIS — Z7982 Long term (current) use of aspirin: Secondary | ICD-10-CM | POA: Insufficient documentation

## 2018-10-20 DIAGNOSIS — G4733 Obstructive sleep apnea (adult) (pediatric): Secondary | ICD-10-CM | POA: Diagnosis not present

## 2018-10-23 NOTE — Procedures (Signed)
[place] A. Merlene Laughter, MD     [email or web address]             NOCTURNAL POLYSOMNOGRAPHY   LOCATION: [facility]   Patient Name: [name], [name] Date: 10/20/2018 Gender: Female D.O.B: Feb 04, 1976 Age (years): 75 Referring Provider: Phillips Odor MD, ABSM Height (inches): 68 Interpreting Physician: Phillips Odor MD, ABSM Weight (lbs): 270 RPSGT: Peak, Robert BMI: 41 MRN: [id number] Neck Size: 16.00 CLINICAL INFORMATION Sleep Study Type: NPSG     Indication for sleep study: N/A     Epworth Sleepiness Score: 10     SLEEP STUDY TECHNIQUE As per the AASM Manual for the Scoring of Sleep and Associated Events v2.3 (April 2016) with a hypopnea requiring 4% desaturations.  The channels recorded and monitored were frontal, central and occipital EEG, electrooculogram (EOG), submentalis EMG (chin), nasal and oral airflow, thoracic and abdominal wall motion, anterior tibialis EMG, snore microphone, electrocardiogram, and pulse oximetry.  MEDICATIONS Medications self-administered by patient taken the night of the study : N/A  Current Outpatient Medications:  .  albuterol (VENTOLIN HFA) 108 (90 Base) MCG/ACT inhaler, INHALE 2 PUFFS INTO THE LUNGS EVERY 4 (FOUR) HOURS AS NEEDED FOR WHEEZING OR SHORTNESS OF BREATH., Disp: 18 g, Rfl: 2 .  amphetamine-dextroamphetamine (ADDERALL) 15 MG tablet, Take 15 mg by mouth 2 (two) times daily., Disp: , Rfl:  .  aspirin EC 325 MG tablet, Take 1 tablet (325 mg total) by mouth daily., Disp: 30 tablet, Rfl: 0 .  benztropine (COGENTIN) 2 MG tablet, Take 2 mg by mouth at bedtime., Disp: , Rfl:  .  cephALEXin (KEFLEX) 500 MG capsule, Take 1 capsule (500 mg total) by mouth 2 (two) times daily., Disp: 14 capsule, Rfl: 0 .  clonazePAM (KLONOPIN) 1 MG tablet, Take 2 mg by mouth daily. , Disp: , Rfl:  .  haloperidol (HALDOL) 5 MG tablet, 1 tab in AM and 3 tab at HS, Disp: , Rfl:  .  LORazepam (ATIVAN) 1 MG tablet, Take 1 mg by  mouth daily., Disp: , Rfl:  .  mupirocin ointment (BACTROBAN) 2 %, Place 1 application into the nose 2 (two) times daily., Disp: 22 g, Rfl: 0 .  NUVIGIL 250 MG tablet, Take 250 mg by mouth daily., Disp: , Rfl: 5 .  ondansetron (ZOFRAN ODT) 4 MG disintegrating tablet, Take 1 tablet (4 mg total) by mouth every 8 (eight) hours as needed for nausea or vomiting., Disp: 20 tablet, Rfl: 0 .  QUEtiapine (SEROQUEL) 300 MG tablet, Take 600 mg by mouth at bedtime. , Disp: , Rfl:  .  sulfamethoxazole-trimethoprim (BACTRIM DS) 800-160 MG tablet, Take 1 tablet by mouth 2 (two) times daily., Disp: 14 tablet, Rfl: 0 .  valACYclovir (VALTREX) 500 MG tablet, TAKE 1 TABLET (500 MG TOTAL) BY MOUTH DAILY AS NEEDED (FEVER BLISTER)., Disp: 90 tablet, Rfl: 0'     SLEEP ARCHITECTURE The study was initiated at 9:37:30 PM and ended at 5:00:48 AM.  Sleep onset time was 0.0 minutes and the sleep efficiency was 99.9%%. The total sleep time was 442.9 minutes.  Stage REM latency was N/A minutes.  The patient spent 0.2%% of the night in stage N1 sleep, 23.2%% in stage N2 sleep, 76.5%% in stage N3 and 0% in REM.  Alpha intrusion was absent.  Supine sleep was 0.00%.  RESPIRATORY PARAMETERS The overall apnea/hypopnea index (AHI) was 1.1 per hour. There were 2 total apneas, including 2 obstructive, 0 central and 0 mixed apneas. There were 6 hypopneas and 4  RERAs.  The AHI during Stage REM sleep was N/A per hour.  AHI while supine was N/A per hour.  The mean oxygen saturation was 92.7%. The minimum SpO2 during sleep was 86.0%.  loud snoring was noted during this study.  CARDIAC DATA The 2 lead EKG demonstrated sinus rhythm. The mean heart rate was 97.8 beats per minute. Other EKG findings include: None.  LEG MOVEMENT DATA The total PLMS were 0 with a resulting PLMS index of 0.0. Associated arousal with leg movement index was 0.0.  IMPRESSIONS 1. No significant obstructive sleep apnea occurred during this study.  2. No significant central sleep apnea occurred during this study. 3. Absent REM sleep is noted.   Delano Metz, MD Diplomate, American Board of Sleep Medicine. ELECTRONICALLY SIGNED ON:  10/23/2018, 10:38 PM [place] PH: [phone]   FX: [phone] [place]

## 2018-11-08 ENCOUNTER — Encounter: Payer: Self-pay | Admitting: Family Medicine

## 2018-11-08 DIAGNOSIS — I639 Cerebral infarction, unspecified: Secondary | ICD-10-CM | POA: Insufficient documentation

## 2018-11-16 DIAGNOSIS — F4010 Social phobia, unspecified: Secondary | ICD-10-CM | POA: Diagnosis not present

## 2018-11-16 DIAGNOSIS — Z8673 Personal history of transient ischemic attack (TIA), and cerebral infarction without residual deficits: Secondary | ICD-10-CM | POA: Insufficient documentation

## 2018-11-16 DIAGNOSIS — F319 Bipolar disorder, unspecified: Secondary | ICD-10-CM | POA: Diagnosis not present

## 2018-11-16 DIAGNOSIS — I639 Cerebral infarction, unspecified: Secondary | ICD-10-CM | POA: Insufficient documentation

## 2018-11-21 DIAGNOSIS — G4733 Obstructive sleep apnea (adult) (pediatric): Secondary | ICD-10-CM | POA: Diagnosis not present

## 2018-11-21 DIAGNOSIS — G4459 Other complicated headache syndrome: Secondary | ICD-10-CM | POA: Diagnosis not present

## 2018-11-21 DIAGNOSIS — E662 Morbid (severe) obesity with alveolar hypoventilation: Secondary | ICD-10-CM | POA: Diagnosis not present

## 2018-11-21 DIAGNOSIS — I6930 Unspecified sequelae of cerebral infarction: Secondary | ICD-10-CM | POA: Diagnosis not present

## 2018-12-14 ENCOUNTER — Ambulatory Visit: Payer: Medicare Other

## 2018-12-15 ENCOUNTER — Ambulatory Visit (INDEPENDENT_AMBULATORY_CARE_PROVIDER_SITE_OTHER): Payer: Medicare Other

## 2018-12-15 ENCOUNTER — Other Ambulatory Visit: Payer: Self-pay

## 2018-12-15 DIAGNOSIS — Z23 Encounter for immunization: Secondary | ICD-10-CM

## 2019-01-06 ENCOUNTER — Other Ambulatory Visit: Payer: Self-pay

## 2019-01-09 ENCOUNTER — Ambulatory Visit (INDEPENDENT_AMBULATORY_CARE_PROVIDER_SITE_OTHER): Payer: Medicare Other | Admitting: Family Medicine

## 2019-01-09 ENCOUNTER — Encounter: Payer: Self-pay | Admitting: Family Medicine

## 2019-01-09 ENCOUNTER — Other Ambulatory Visit: Payer: Self-pay

## 2019-01-09 VITALS — BP 130/86 | HR 99 | Temp 97.4°F | Resp 18 | Ht 68.0 in | Wt 276.0 lb

## 2019-01-09 DIAGNOSIS — Z8673 Personal history of transient ischemic attack (TIA), and cerebral infarction without residual deficits: Secondary | ICD-10-CM | POA: Diagnosis not present

## 2019-01-09 DIAGNOSIS — I6930 Unspecified sequelae of cerebral infarction: Secondary | ICD-10-CM | POA: Diagnosis not present

## 2019-01-09 DIAGNOSIS — B1089 Other human herpesvirus infection: Secondary | ICD-10-CM

## 2019-01-09 MED ORDER — VALACYCLOVIR HCL 500 MG PO TABS: 500.0000 mg | ORAL_TABLET | Freq: Every day | ORAL | 2 refills | Status: DC | PRN

## 2019-01-09 MED ORDER — ONDANSETRON 4 MG PO TBDP: 4.0000 mg | ORAL_TABLET | Freq: Three times a day (TID) | ORAL | 0 refills | Status: DC | PRN

## 2019-01-09 NOTE — Progress Notes (Signed)
Subjective:    Patient ID: [name], female    DOB: 08-21-1975, 43 y.o.   MRN: [id number]  HPI Patient is here today requesting a refill on her Valtrex.  She takes 500 mg daily to suppress herpes outbreaks.  She gets a "cold sore" on her right gluteus.  This occurs frequently unless she takes the medication.  She has had labs in the past that confirmed HSV.  She is also requesting a refill on Zofran that she uses as needed for nausea associated with migraines.  However concerning for me, the patient fell and was seen at [facility] in July.  A CT scan of the brain at that time had a coincidental finding of a remote chronic left cerebellar infarct: FINDINGS: Brain: There is no acute intracranial hemorrhage. No intracranial mass, midline shift or extra-axial collection. Moderate-sized chronic infarct within the inferior left cerebellum.   Patient states that she has fallen occasionally and does report occasional balance issues however she has no recollection of when this may have occurred.  She is currently taking aspirin 325 mg a day at the direction of her neurologist.  He also schedule the patient for a sleep study which showed no evidence of obstructive sleep apnea.  He started her on Topamax I am assuming for migraine prevention although the patient is uncertain.  She states that she continues to get almost daily headaches despite taking Topamax.  However I can see no evaluation of her cholesterol recently since 2015.  The patient also vapes with nicotine.  I also do not see any evaluation of the vertebral arteries or carotid arteries. Past Medical History:  Diagnosis Date  . Anemia   . Anxiety   . Arthritis   . Asthma   . Bipolar affective ([place])   . Blood transfusion without reported diagnosis   . Colostomy in place [facility])   . CVA (cerebrovascular accident) ([place])    left cerebellar infarct found accidentally on CT (2020)  . Depression   . Diverticulitis 2008   perforated/  requiring resection  . Family history of adverse reaction to anesthesia    MGM was placed on vent after surgery  . GERD (gastroesophageal reflux disease)   . Headache   . Shortness of breath dyspnea   . Sleep apnea    mild, does not use c-pap machine  . Substance abuse ([place])    12 years ago-crack cocaine   Past Surgical History:  Procedure Laterality Date  . Abdominal wall hernia  07/2007   open repair with lysis of adhesions  . COLONOSCOPY  03/13/2010   [id number] hemorrhoids likely cause of hematochezia, otherwise normal  . Colostomy reversal  11/2006  . ESOPHAGOGASTRODUODENOSCOPY  03/13/2010   [id number] esophagus, status post passage of a Maloney dilator/Small hiatal hernia/ Antral erosions, status post biopsy  . EXAMINATION UNDER ANESTHESIA N/A 05/11/2012   Procedure: EXAM UNDER ANESTHESIA;  Surgeon: Donato Heinz, MD;  Location: [facility];  Service: General;  Laterality: N/A;  . Exploratory laparotomy with resection  2008  . FINGER CLOSED REDUCTION Right 08/05/2012   Procedure: CLOSED REDUCTION RIGHT THUMB (FINGER);  Surgeon: Linna Hoff, MD;  Location: [place];  Service: Orthopedics;  Laterality: Right;  . HERNIA REPAIR    . Hx of abd wall seroma  08/2007   Drained via [place] in [place], [place]  . Hx of abd wall seroma  10/2007   Drained by Dr. Geroge Baseman in office  . Kidney stones    .  LAPAROSCOPIC GASTRIC SLEEVE RESECTION N/A 02/13/2014   Procedure: LAPAROSCOPIC GASTRIC SLEEVE RESECTION LYSIS OF ADHESIONS, UPPER ENDOSCOPY;  Surgeon: [name], MD;  [facility];  Service: General;  Laterality: N/A;  . LAPAROSCOPY N/A 09/28/2014   Procedure: LAPAROSCOPY DIAGNOSTIC, INCISION AND DRAINAGE WITH LAPAROSCOPIC EXPLORATION OF ABDOMINAL WALL SEROMA with ultrasound;  Surgeon: Greer Pickerel, MD;  [facility];  Service: General;  Laterality: N/A;  . PLACEMENT OF SETON N/A 05/11/2012   Procedure: PLACEMENT OF SETON;  Surgeon: Donato Heinz, MD;  Location: [facility];  Service:  General;  Laterality: N/A;  . root canal 7-16    . TONSILLECTOMY    . TREATMENT FISTULA ANAL     Current Outpatient Medications on File Prior to Visit  Medication Sig Dispense Refill  . amphetamine-dextroamphetamine (ADDERALL) 15 MG tablet Take 15 mg by mouth 2 (two) times daily.    Marland Kitchen aspirin EC 325 MG tablet Take 1 tablet (325 mg total) by mouth daily. 30 tablet 0  . benztropine (COGENTIN) 2 MG tablet Take 2 mg by mouth at bedtime.    . clonazePAM (KLONOPIN) 1 MG tablet Take 2 mg by mouth daily.     . haloperidol (HALDOL) 5 MG tablet 1 tab in AM and 3 tab at HS    . LORazepam (ATIVAN) 1 MG tablet Take 1 mg by mouth daily.    Marland Kitchen NUVIGIL 250 MG tablet Take 250 mg by mouth daily.  5  . QUEtiapine (SEROQUEL) 300 MG tablet Take 600 mg by mouth at bedtime.     . gabapentin (NEURONTIN) 300 MG capsule Take 300 mg by mouth 3 (three) times daily.    Marland Kitchen topiramate (TOPAMAX) 100 MG tablet Take 100 mg by mouth daily.     No current facility-administered medications on file prior to visit.    No Active Allergies Social History   Socioeconomic History  . Marital status: Single    Spouse name: Not on file  . Number of children: 2  . Years of education: Not on file  . Highest education level: Not on file  Occupational History  . Occupation: Insurance claims handler: UNEMPLOYED  Social Needs  . Financial resource strain: Not on file  . Food insecurity    Worry: Not on file    Inability: Not on file  . Transportation needs    Medical: Not on file    Non-medical: Not on file  Tobacco Use  . Smoking status: Current Every Day Smoker    Packs/day: 0.50    Years: 14.00    Pack years: 7.00    Types: E-cigarettes    Last attempt to quit: 12/20/2013    Years since quitting: 5.0  . Smokeless tobacco: Never Used  . Tobacco comment: Vapor cigarettes.  Substance and Sexual Activity  . Alcohol use: No    Alcohol/week: 0.0 standard drinks  . Drug use: No  . Sexual activity: Never    Birth  control/protection: I.U.D.  Lifestyle  . Physical activity    Days per week: Not on file    Minutes per session: Not on file  . Stress: Not on file  Relationships  . Social Herbalist on phone: Not on file    Gets together: Not on file    Attends religious service: Not on file    Active member of club or organization: Not on file    Attends meetings of clubs or organizations: Not on file  Relationship status: Not on file  . Intimate partner violence    Fear of current or ex partner: Not on file    Emotionally abused: Not on file    Physically abused: Not on file    Forced sexual activity: Not on file  Other Topics Concern  . Not on file  Social History Narrative  . Not on file     Review of Systems  All other systems reviewed and are negative.      Objective:   Physical Exam Vitals signs reviewed.  Constitutional:      Appearance: Normal appearance. She is obese.  HENT:     Head: Normocephalic and atraumatic.  Eyes:     Extraocular Movements: Extraocular movements intact.     Pupils: Pupils are equal, round, and reactive to light.  Neck:     Musculoskeletal: Neck supple.     Vascular: No carotid bruit.  Cardiovascular:     Rate and Rhythm: Normal rate and regular rhythm.     Pulses: Normal pulses.     Heart sounds: Normal heart sounds. No murmur. No friction rub. No gallop.   Pulmonary:     Effort: Pulmonary effort is normal. No respiratory distress.     Breath sounds: Normal breath sounds. No stridor. No wheezing or rhonchi.  Lymphadenopathy:     Cervical: No cervical adenopathy.  Neurological:     General: No focal deficit present.     Mental Status: She is alert and oriented to person, place, and time. Mental status is at baseline.     Cranial Nerves: No cranial nerve deficit.     Sensory: No sensory deficit.     Motor: No weakness.     Coordination: Coordination normal.     Gait: Gait normal.           Assessment & Plan:  History of  cerebellar stroke - Plan: CBC with Differential, COMPLETE METABOLIC PANEL WITH GFR, Lipid Panel, MR Angiogram Neck W Wo Contrast  ' I am happy to refill the patient's Valtrex and Zofran that she uses for herpes suppression as well as for nausea.  However I am concerned by her history of a cerebellar infarct.  I would complete the work-up by checking a fasting lipid panel.  Ideally I like her LDL cholesterol to be less than 70.  Given the fact she had an ischemic infarct, I would likely recommend a statin regardless of her cholesterol.  I would also recommend an MRA of the neck to evaluate the posterior circulation of the neck to see if there is any occlusion in the vertebral or basilar arteries.

## 2019-01-10 ENCOUNTER — Other Ambulatory Visit: Payer: Self-pay | Admitting: Family Medicine

## 2019-01-10 ENCOUNTER — Other Ambulatory Visit: Payer: Self-pay

## 2019-01-10 DIAGNOSIS — I6930 Unspecified sequelae of cerebral infarction: Secondary | ICD-10-CM

## 2019-01-10 DIAGNOSIS — Z8673 Personal history of transient ischemic attack (TIA), and cerebral infarction without residual deficits: Secondary | ICD-10-CM

## 2019-01-10 LAB — COMPLETE METABOLIC PANEL WITH GFR
AG Ratio: 1.6 (calc) (ref 1.0–2.5)
ALT: 9 U/L (ref 6–29)
AST: 10 U/L (ref 10–30)
Albumin: 4.2 g/dL (ref 3.6–5.1)
Alkaline phosphatase (APISO): 103 U/L (ref 31–125)
BUN: 8 mg/dL (ref 7–25)
CO2: 24 mmol/L (ref 20–32)
Calcium: 9.2 mg/dL (ref 8.6–10.2)
Chloride: 103 mmol/L (ref 98–110)
Creat: 0.7 mg/dL (ref 0.50–1.10)
GFR, Est African American: 124 mL/min/{1.73_m2} (ref >=60)
GFR, Est Non African American: 107 mL/min/{1.73_m2} (ref >=60)
Globulin: 2.7 g/dL (calc) (ref 1.9–3.7)
Glucose, Bld: 73 mg/dL (ref 65–99)
Potassium: 4 mmol/L (ref 3.5–5.3)
Sodium: 138 mmol/L (ref 135–146)
Total Bilirubin: 0.3 mg/dL (ref 0.2–1.2)
Total Protein: 6.9 g/dL (ref 6.1–8.1)

## 2019-01-10 LAB — LIPID PANEL
Cholesterol: 179 mg/dL (ref <200)
HDL: 41 mg/dL — ABNORMAL LOW (ref >=50)
LDL Cholesterol (Calc): 107 mg/dL (calc) — ABNORMAL HIGH
Non-HDL Cholesterol (Calc): 138 mg/dL (calc) — ABNORMAL HIGH (ref <130)
Total CHOL/HDL Ratio: 4.4 (calc) (ref <5.0)
Triglycerides: 194 mg/dL — ABNORMAL HIGH (ref <150)

## 2019-01-10 LAB — CBC WITH DIFFERENTIAL/PLATELET
Absolute Monocytes: 555 cells/uL (ref 200–950)
Basophils Absolute: 12 cells/uL (ref 0–200)
Basophils Relative: 0.2 %
Eosinophils Absolute: 153 cells/uL (ref 15–500)
Eosinophils Relative: 2.6 %
HCT: 36.9 % (ref 35.0–45.0)
Hemoglobin: 11.7 g/dL (ref 11.7–15.5)
Lymphs Abs: 1103 cells/uL (ref 850–3900)
MCH: 25.6 pg — ABNORMAL LOW (ref 27.0–33.0)
MCHC: 31.7 g/dL — ABNORMAL LOW (ref 32.0–36.0)
MCV: 80.7 fL (ref 80.0–100.0)
MPV: 8.5 fL (ref 7.5–12.5)
Monocytes Relative: 9.4 %
Neutro Abs: 4077 cells/uL (ref 1500–7800)
Neutrophils Relative %: 69.1 %
Platelets: 389 10*3/uL (ref 140–400)
RBC: 4.57 10*6/uL (ref 3.80–5.10)
RDW: 14.7 % (ref 11.0–15.0)
Total Lymphocyte: 18.7 %
WBC: 5.9 10*3/uL (ref 3.8–10.8)

## 2019-01-10 MED ORDER — ATORVASTATIN CALCIUM 40 MG PO TABS: 40.0000 mg | ORAL_TABLET | Freq: Every day | ORAL | 3 refills | Status: DC

## 2019-01-16 ENCOUNTER — Other Ambulatory Visit (HOSPITAL_COMMUNITY): Payer: Medicare Other

## 2019-01-18 ENCOUNTER — Ambulatory Visit (HOSPITAL_COMMUNITY)
Admission: RE | Admit: 2019-01-18 | Discharge: 2019-01-18 | Disposition: A | Discharge status: Home / Self Care | Payer: Medicare Other | Source: Ambulatory Visit | Attending: Family Medicine | Admitting: Family Medicine

## 2019-01-18 ENCOUNTER — Other Ambulatory Visit: Payer: Self-pay

## 2019-01-18 DIAGNOSIS — G464 Cerebellar stroke syndrome: Secondary | ICD-10-CM | POA: Diagnosis not present

## 2019-01-18 DIAGNOSIS — I6930 Unspecified sequelae of cerebral infarction: Secondary | ICD-10-CM

## 2019-01-18 DIAGNOSIS — Z8673 Personal history of transient ischemic attack (TIA), and cerebral infarction without residual deficits: Secondary | ICD-10-CM | POA: Diagnosis not present

## 2019-01-18 DIAGNOSIS — I771 Stricture of artery: Secondary | ICD-10-CM | POA: Diagnosis not present

## 2019-01-18 MED ORDER — GADOBUTROL 1 MMOL/ML IV SOLN
10.0000 mL | Freq: Once | INTRAVENOUS | Status: AC | PRN
  Administered 2019-01-18: 11:00:00 10 mL via INTRAVENOUS

## 2019-02-01 ENCOUNTER — Other Ambulatory Visit: Payer: Self-pay

## 2019-02-01 ENCOUNTER — Encounter: Payer: Self-pay | Admitting: Family Medicine

## 2019-02-01 ENCOUNTER — Ambulatory Visit (INDEPENDENT_AMBULATORY_CARE_PROVIDER_SITE_OTHER): Payer: Medicare Other | Admitting: Family Medicine

## 2019-02-01 DIAGNOSIS — J069 Acute upper respiratory infection, unspecified: Secondary | ICD-10-CM

## 2019-02-01 DIAGNOSIS — I6930 Unspecified sequelae of cerebral infarction: Secondary | ICD-10-CM

## 2019-02-01 DIAGNOSIS — K1379 Other lesions of oral mucosa: Secondary | ICD-10-CM | POA: Diagnosis not present

## 2019-02-01 DIAGNOSIS — F319 Bipolar disorder, unspecified: Secondary | ICD-10-CM | POA: Diagnosis not present

## 2019-02-01 DIAGNOSIS — Z20828 Contact with and (suspected) exposure to other viral communicable diseases: Secondary | ICD-10-CM | POA: Diagnosis not present

## 2019-02-01 DIAGNOSIS — Z20822 Contact with and (suspected) exposure to covid-19: Secondary | ICD-10-CM

## 2019-02-01 DIAGNOSIS — F4010 Social phobia, unspecified: Secondary | ICD-10-CM | POA: Diagnosis not present

## 2019-02-01 MED ORDER — ALBUTEROL SULFATE HFA 108 (90 BASE) MCG/ACT IN AERS: 2.0000 | INHALATION_SPRAY | RESPIRATORY_TRACT | 1 refills | Status: DC | PRN

## 2019-02-01 MED ORDER — BENZONATATE 200 MG PO CAPS: 200.0000 mg | ORAL_CAPSULE | Freq: Three times a day (TID) | ORAL | 0 refills | Status: DC | PRN

## 2019-02-01 MED ORDER — FIRST-DUKES MOUTHWASH MT SUSP: OROMUCOSAL | 0 refills | Status: DC

## 2019-02-01 NOTE — Progress Notes (Signed)
Virtual Visit via Telephone Note  I connected with [name] on 02/01/19 at 12:27pm by telephone and verified that I am speaking with the correct person using two identifiers.     Pt location: at home   Physician location:  In office, [facility], Vic Blackbird MD     On call: patient and physician   I discussed the limitations, risks, security and privacy concerns of performing an evaluation and management service by telephone and the availability of in person appointments. I also discussed with the patient that there may be a patient responsible charge related to this service. The patient expressed understanding and agreed to proceed.   History of Present Illness:  Pt called in with sore throat, has blisters and sores in her tongue and back of throat for the past 4 days. Does not tonsils.   subjective fever on and off. Had a temp  YESTERDAY 100.56F she took her son to the barbershop. She did go to psychology appt today and didn't have fever. She has decreased appetite due to pain with eating She does have dry cough for the past few days as well which is worse at nighttime. No sinus drainage, no facial pain .  No loss of taste or change in smell No difficulty breathing, no wheezing, she does not have albuterol on hand, but requested refill as it helps with her cough No known sick contacts  She has been using Luden's cough drops    Observations/Objective: NAD noted on phone, speaking in full sentences   Assessment and Plan:  Viral URI with what sounds like either ulcerations or blisters in the mouth.  I think that this is related to the virus in itself.  She is also having intermittent fevers low-grade.  With her medical history I recommend COVID-19 testing.  She admits that she was out at a basketball game this weekend with her son.  I recommend she quarantine until she gets her results back.  In the meantime we will give her Magic mouthwash to use for the mouth sores  and discomfort will also use albuterol inhaler for the cough and Tessalon [place].  If she progresses she needs to be seen in the local emergency room.   Follow Up Instructions:    I discussed the assessment and treatment plan with the patient. The patient was provided an opportunity to ask questions and all were answered. The patient agreed with the plan and demonstrated an understanding of the instructions.   The patient was advised to call back or seek an in-person evaluation if the symptoms worsen or if the condition fails to improve as anticipated.  I provided 10 minutes of non-face-to-face time during this encounter. End Time 12:37pm  Vic Blackbird, MD

## 2019-02-02 LAB — NOVEL CORONAVIRUS, NAA: SARS-CoV-2, NAA: NOT DETECTED

## 2019-02-03 ENCOUNTER — Telehealth: Payer: Self-pay | Admitting: Family Medicine

## 2019-02-03 NOTE — Telephone Encounter (Signed)
Pt called and states that her COVID test was negative and she is still very sick and wants an antibx. She has the same symptoms as she did at the phone visit. Nothing has changed and no increase in symptoms. Reviewed plan with her to treat with otc meds and the tessalon and if not improving by next week to please give [place] a cal back. She was insistent on getting an antibx but understands the plan stating "I dont like it but I understand."

## 2019-02-06 ENCOUNTER — Other Ambulatory Visit: Payer: Self-pay

## 2019-02-06 MED ORDER — BENZONATATE 200 MG PO CAPS: 200.0000 mg | ORAL_CAPSULE | Freq: Three times a day (TID) | ORAL | 0 refills | Status: DC | PRN

## 2019-02-06 MED ORDER — ONDANSETRON 4 MG PO TBDP: 4.0000 mg | ORAL_TABLET | Freq: Three times a day (TID) | ORAL | 0 refills | Status: DC | PRN

## 2019-02-06 NOTE — Telephone Encounter (Signed)
Requested Prescriptions   Pending Prescriptions Disp Refills  . ondansetron (ZOFRAN ODT) 4 MG disintegrating tablet 20 tablet 0    Sig: Take 1 tablet (4 mg total) by mouth every 8 (eight) hours as needed for nausea or vomiting.  . benzonatate (TESSALON) 200 MG capsule 20 capsule 0    Sig: Take 1 capsule (200 mg total) by mouth 3 (three) times daily as needed for cough.     Last OV 02/01/2019   Last written 01/09/2019 zofran                      02/01/2019 tessalon

## 2019-02-09 ENCOUNTER — Ambulatory Visit (INDEPENDENT_AMBULATORY_CARE_PROVIDER_SITE_OTHER): Payer: Medicare Other | Admitting: Family Medicine

## 2019-02-09 DIAGNOSIS — J209 Acute bronchitis, unspecified: Secondary | ICD-10-CM | POA: Diagnosis not present

## 2019-02-09 DIAGNOSIS — I6930 Unspecified sequelae of cerebral infarction: Secondary | ICD-10-CM

## 2019-02-09 MED ORDER — HYDROCODONE-HOMATROPINE 5-1.5 MG/5ML PO SYRP: 5.0000 mL | ORAL_SOLUTION | Freq: Three times a day (TID) | ORAL | 0 refills | Status: DC | PRN

## 2019-02-09 MED ORDER — AZITHROMYCIN 250 MG PO TABS: ORAL_TABLET | ORAL | 0 refills | Status: DC

## 2019-02-09 NOTE — Progress Notes (Signed)
Subjective:    Patient ID: [name], female    DOB: 1975-11-24, 43 y.o.   MRN: [id number]  HPI Patient is being seen today as a telephone visit.  Phone call began at 845.  Phone call concluded at 857.  Patient has been battling upper respiratory infection now for almost 2 weeks.  She reports fatigue, worsening cough.  The cough is so severe now she is having incontinence with coughing.  She tested negative for Covid on the ninth.  She denies any chest pain but she does report shortness of breath.  She also reports sputum that is white and slightly green.  She denies any fevers or chills but she does have some mild pleurisy. Past Medical History:  Diagnosis Date  . Anemia   . Anxiety   . Arthritis   . Asthma   . Bipolar affective ([place])   . Blood transfusion without reported diagnosis   . Colostomy in place [facility])   . CVA (cerebrovascular accident) ([place])    left cerebellar infarct found accidentally on CT (2020)  . Depression   . Diverticulitis 2008   perforated/ requiring resection  . Family history of adverse reaction to anesthesia    MGM was placed on vent after surgery  . GERD (gastroesophageal reflux disease)   . Headache   . Shortness of breath dyspnea   . Sleep apnea    mild, does not use c-pap machine  . Substance abuse ([employer])    12 years ago-crack cocaine   Past Surgical History:  Procedure Laterality Date  . Abdominal wall hernia  07/2007   open repair with lysis of adhesions  . COLONOSCOPY  03/13/2010   [id number] hemorrhoids likely cause of hematochezia, otherwise normal  . Colostomy reversal  11/2006  . ESOPHAGOGASTRODUODENOSCOPY  03/13/2010   [id number] esophagus, status post passage of a Maloney dilator/Small hiatal hernia/ Antral erosions, status post biopsy  . EXAMINATION UNDER ANESTHESIA N/A 05/11/2012   Procedure: EXAM UNDER ANESTHESIA;  Surgeon: Donato Heinz, MD;  Location: [facility];  Service: General;  Laterality: N/A;  . Exploratory  laparotomy with resection  2008  . FINGER CLOSED REDUCTION Right 08/05/2012   Procedure: CLOSED REDUCTION RIGHT THUMB (FINGER);  Surgeon: Linna Hoff, MD;  Location: [place];  Service: Orthopedics;  Laterality: Right;  . HERNIA REPAIR    . Hx of abd wall seroma  08/2007   Drained via [place] in [place], [place]  . Hx of abd wall seroma  10/2007   Drained by Dr. Geroge Baseman in office  . Kidney stones    . LAPAROSCOPIC GASTRIC SLEEVE RESECTION N/A 02/13/2014   Procedure: LAPAROSCOPIC GASTRIC SLEEVE RESECTION LYSIS OF ADHESIONS, UPPER ENDOSCOPY;  Surgeon: [name], MD;  [facility];  Service: General;  Laterality: N/A;  . LAPAROSCOPY N/A 09/28/2014   Procedure: LAPAROSCOPY DIAGNOSTIC, INCISION AND DRAINAGE WITH LAPAROSCOPIC EXPLORATION OF ABDOMINAL WALL SEROMA with ultrasound;  Surgeon: Greer Pickerel, MD;  [facility];  Service: General;  Laterality: N/A;  . PLACEMENT OF SETON N/A 05/11/2012   Procedure: PLACEMENT OF SETON;  Surgeon: Donato Heinz, MD;  Location: [facility];  Service: General;  Laterality: N/A;  . root canal 7-16    . TONSILLECTOMY    . TREATMENT FISTULA ANAL     Current Outpatient Medications on File Prior to Visit  Medication Sig Dispense Refill  . albuterol (VENTOLIN HFA) 108 (90 Base) MCG/ACT inhaler Inhale 2 puffs into the lungs every 4 (four) hours as needed  for wheezing or shortness of breath. 18 g 1  . amphetamine-dextroamphetamine (ADDERALL) 15 MG tablet Take 15 mg by mouth 2 (two) times daily.    Marland Kitchen aspirin EC 325 MG tablet Take 1 tablet (325 mg total) by mouth daily. 30 tablet 0  . atorvastatin (LIPITOR) 40 MG tablet Take 1 tablet (40 mg total) by mouth daily. 30 tablet 3  . benzonatate (TESSALON) 200 MG capsule Take 1 capsule (200 mg total) by mouth 3 (three) times daily as needed for cough. 20 capsule 0  . benztropine (COGENTIN) 2 MG tablet Take 2 mg by mouth at bedtime.    . clonazePAM (KLONOPIN) 1 MG tablet Take 2 mg by mouth daily.     .  Diphenhyd-Hydrocort-Nystatin (FIRST-DUKES MOUTHWASH) SUSP Swish and swallow 11ml po TID prn mouth pain/sores for 10 days 237 mL 0  . gabapentin (NEURONTIN) 300 MG capsule Take 300 mg by mouth 3 (three) times daily.    . haloperidol (HALDOL) 5 MG tablet 1 tab in AM and 3 tab at HS    . LORazepam (ATIVAN) 1 MG tablet Take 1 mg by mouth daily.    Marland Kitchen NUVIGIL 250 MG tablet Take 250 mg by mouth daily.  5  . ondansetron (ZOFRAN ODT) 4 MG disintegrating tablet Take 1 tablet (4 mg total) by mouth every 8 (eight) hours as needed for nausea or vomiting. 20 tablet 0  . QUEtiapine (SEROQUEL) 300 MG tablet Take 600 mg by mouth at bedtime.     . topiramate (TOPAMAX) 100 MG tablet Take 100 mg by mouth daily.    . valACYclovir (VALTREX) 500 MG tablet Take 1 tablet (500 mg total) by mouth daily as needed (fever blister). 90 tablet 2   No current facility-administered medications on file prior to visit.   No Active Allergies Social History   Socioeconomic History  . Marital status: Single    Spouse name: Not on file  . Number of children: 2  . Years of education: Not on file  . Highest education level: Not on file  Occupational History  . Occupation: Insurance claims handler: UNEMPLOYED  Tobacco Use  . Smoking status: Current Every Day Smoker    Packs/day: 0.50    Years: 14.00    Pack years: 7.00    Types: E-cigarettes    Last attempt to quit: 12/20/2013    Years since quitting: 5.1  . Smokeless tobacco: Never Used  . Tobacco comment: Vapor cigarettes.  Substance and Sexual Activity  . Alcohol use: No    Alcohol/week: 0.0 standard drinks  . Drug use: No  . Sexual activity: Never    Birth control/protection: I.U.D.  Other Topics Concern  . Not on file  Social History Narrative  . Not on file   Social Determinants of Health   Financial Resource Strain:   . Difficulty of Paying Living Expenses: Not on file  Food Insecurity:   . Worried About Charity fundraiser in the Last Year: Not on  file  . Ran Out of Food in the Last Year: Not on file  Transportation Needs:   . Lack of Transportation (Medical): Not on file  . Lack of Transportation (Non-Medical): Not on file  Physical Activity:   . Days of Exercise per Week: Not on file  . Minutes of Exercise per Session: Not on file  Stress:   . Feeling of Stress : Not on file  Social Connections:   . Frequency of Communication with Friends and  Family: Not on file  . Frequency of Social Gatherings with Friends and Family: Not on file  . Attends Religious Services: Not on file  . Active Member of Clubs or Organizations: Not on file  . Attends Archivist Meetings: Not on file  . Marital Status: Not on file  Intimate Partner Violence:   . Fear of Current or Ex-Partner: Not on file  . Emotionally Abused: Not on file  . Physically Abused: Not on file  . Sexually Abused: Not on file      Review of Systems  All other systems reviewed and are negative.      Objective:   Physical Exam        Assessment & Plan:  Acute bronchitis, unspecified organism  Begin Z-Pak for possible secondary bronchitis versus early walking pneumonia.  Use Hycodan 1 teaspoon every 8 hours as needed for cough as the patient has gone through 2 bottles of Tessalon and symptoms or not improving.  If shortness of breath worsen she needs to go to the emergency room.

## 2019-02-12 ENCOUNTER — Other Ambulatory Visit: Payer: Self-pay | Admitting: Family Medicine

## 2019-02-19 ENCOUNTER — Encounter (HOSPITAL_COMMUNITY): Payer: Self-pay

## 2019-02-19 ENCOUNTER — Emergency Department (HOSPITAL_COMMUNITY)
Admission: EM | Admit: 2019-02-19 | Discharge: 2019-02-19 | Disposition: A | Discharge status: Home / Self Care | Payer: Medicare Other | Attending: Emergency Medicine | Admitting: Emergency Medicine

## 2019-02-19 ENCOUNTER — Other Ambulatory Visit: Payer: Self-pay

## 2019-02-19 ENCOUNTER — Emergency Department (HOSPITAL_COMMUNITY): Payer: Medicare Other

## 2019-02-19 DIAGNOSIS — S301XXA Contusion of abdominal wall, initial encounter: Secondary | ICD-10-CM

## 2019-02-19 DIAGNOSIS — E876 Hypokalemia: Secondary | ICD-10-CM | POA: Diagnosis not present

## 2019-02-19 DIAGNOSIS — Y92410 Unspecified street and highway as the place of occurrence of the external cause: Secondary | ICD-10-CM | POA: Insufficient documentation

## 2019-02-19 DIAGNOSIS — J45909 Unspecified asthma, uncomplicated: Secondary | ICD-10-CM | POA: Insufficient documentation

## 2019-02-19 DIAGNOSIS — L7634 Postprocedural seroma of skin and subcutaneous tissue following other procedure: Secondary | ICD-10-CM | POA: Insufficient documentation

## 2019-02-19 DIAGNOSIS — T50901A Poisoning by unspecified drugs, medicaments and biological substances, accidental (unintentional), initial encounter: Secondary | ICD-10-CM | POA: Insufficient documentation

## 2019-02-19 DIAGNOSIS — F17210 Nicotine dependence, cigarettes, uncomplicated: Secondary | ICD-10-CM | POA: Insufficient documentation

## 2019-02-19 DIAGNOSIS — Y9389 Activity, other specified: Secondary | ICD-10-CM | POA: Diagnosis not present

## 2019-02-19 DIAGNOSIS — Z79899 Other long term (current) drug therapy: Secondary | ICD-10-CM | POA: Insufficient documentation

## 2019-02-19 DIAGNOSIS — R1110 Vomiting, unspecified: Secondary | ICD-10-CM | POA: Diagnosis not present

## 2019-02-19 DIAGNOSIS — R000 Tachycardia, unspecified: Secondary | ICD-10-CM | POA: Diagnosis not present

## 2019-02-19 DIAGNOSIS — T50904A Poisoning by unspecified drugs, medicaments and biological substances, undetermined, initial encounter: Secondary | ICD-10-CM

## 2019-02-19 DIAGNOSIS — R4182 Altered mental status, unspecified: Secondary | ICD-10-CM | POA: Diagnosis present

## 2019-02-19 LAB — RAPID URINE DRUG SCREEN, HOSP PERFORMED
Amphetamines: POSITIVE — AB
Barbiturates: NOT DETECTED
Benzodiazepines: POSITIVE — AB
Cocaine: NOT DETECTED
Opiates: NOT DETECTED
Tetrahydrocannabinol: NOT DETECTED

## 2019-02-19 LAB — CBC WITH DIFFERENTIAL/PLATELET
Abs Immature Granulocytes: 0.03 10*3/uL (ref 0.00–0.07)
Basophils Absolute: 0 10*3/uL (ref 0.0–0.1)
Basophils Relative: 0 %
Eosinophils Absolute: 0.1 10*3/uL (ref 0.0–0.5)
Eosinophils Relative: 2 %
HCT: 30.6 % — ABNORMAL LOW (ref 36.0–46.0)
Hemoglobin: 9.1 g/dL — ABNORMAL LOW (ref 12.0–15.0)
Immature Granulocytes: 0 %
Lymphocytes Relative: 14 %
Lymphs Abs: 1.2 10*3/uL (ref 0.7–4.0)
MCH: 24.1 pg — ABNORMAL LOW (ref 26.0–34.0)
MCHC: 29.7 g/dL — ABNORMAL LOW (ref 30.0–36.0)
MCV: 81 fL (ref 80.0–100.0)
Monocytes Absolute: 0.8 10*3/uL (ref 0.1–1.0)
Monocytes Relative: 9 %
Neutro Abs: 6.4 10*3/uL (ref 1.7–7.7)
Neutrophils Relative %: 75 %
Platelets: 511 10*3/uL — ABNORMAL HIGH (ref 150–400)
RBC: 3.78 MIL/uL — ABNORMAL LOW (ref 3.87–5.11)
RDW: 14.5 % (ref 11.5–15.5)
WBC: 8.5 10*3/uL (ref 4.0–10.5)
nRBC: 0 % (ref 0.0–0.2)

## 2019-02-19 LAB — COMPREHENSIVE METABOLIC PANEL
ALT: 48 U/L — ABNORMAL HIGH (ref 0–44)
AST: 51 U/L — ABNORMAL HIGH (ref 15–41)
Albumin: 2.8 g/dL — ABNORMAL LOW (ref 3.5–5.0)
Alkaline Phosphatase: 124 U/L (ref 38–126)
Anion gap: 14 (ref 5–15)
BUN: 6 mg/dL (ref 6–20)
CO2: 25 mmol/L (ref 22–32)
Calcium: 8.4 mg/dL — ABNORMAL LOW (ref 8.9–10.3)
Chloride: 95 mmol/L — ABNORMAL LOW (ref 98–111)
Creatinine, Ser: 0.59 mg/dL (ref 0.44–1.00)
GFR calc Af Amer: >60 mL/min (ref >60)
GFR calc non Af Amer: >60 mL/min (ref >60)
Glucose, Bld: 108 mg/dL — ABNORMAL HIGH (ref 70–99)
Potassium: 2.7 mmol/L — CL (ref 3.5–5.1)
Sodium: 134 mmol/L — ABNORMAL LOW (ref 135–145)
Total Bilirubin: 0.7 mg/dL (ref 0.3–1.2)
Total Protein: 7.4 g/dL (ref 6.5–8.1)

## 2019-02-19 LAB — ACETAMINOPHEN LEVEL: Acetaminophen (Tylenol), Serum: <10 ug/mL — ABNORMAL LOW (ref 10–30)

## 2019-02-19 LAB — SALICYLATE LEVEL: Salicylate Lvl: <7 mg/dL — ABNORMAL LOW (ref 7.0–30.0)

## 2019-02-19 LAB — MAGNESIUM: Magnesium: 1.9 mg/dL (ref 1.7–2.4)

## 2019-02-19 LAB — PREGNANCY, URINE: Preg Test, Ur: NEGATIVE

## 2019-02-19 LAB — ETHANOL: Alcohol, Ethyl (B): <10 mg/dL (ref <10)

## 2019-02-19 MED ORDER — POTASSIUM CHLORIDE 10 MEQ/100ML IV SOLN
10.0000 meq | INTRAVENOUS | Status: AC
  Administered 2019-02-19 (×3): 10 meq via INTRAVENOUS
  Filled 2019-02-19 (×3): qty 100

## 2019-02-19 MED ORDER — SODIUM CHLORIDE 0.9 % IV BOLUS
1000.0000 mL | Freq: Once | INTRAVENOUS | Status: AC
  Administered 2019-02-19: 1000 mL via INTRAVENOUS

## 2019-02-19 MED ORDER — IOHEXOL 300 MG/ML  SOLN
100.0000 mL | Freq: Once | INTRAMUSCULAR | Status: AC | PRN
  Administered 2019-02-19: 100 mL via INTRAVENOUS

## 2019-02-19 MED ORDER — POTASSIUM CHLORIDE CRYS ER 20 MEQ PO TBCR
40.0000 meq | EXTENDED_RELEASE_TABLET | Freq: Once | ORAL | Status: AC
  Administered 2019-02-19: 40 meq via ORAL
  Filled 2019-02-19: qty 2
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
## 2019-02-19 NOTE — ED Notes (Signed)
Pt's purse put into locker with her name on it. Pt closed her purse herself.
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
## 2019-02-19 NOTE — ED Notes (Signed)
Pt giving urine sample

## 2019-02-19 NOTE — ED Notes (Signed)
CRITICAL VALUE ALERT  Critical Value:  K 2.7  Date & Time Notied:  02/19/19 1437  Provider Notified: Otis Peak, PA  Orders Received/Actions taken: na

## 2019-02-19 NOTE — ED Notes (Signed)
Per CT, had to stop potassium. Have restarted

## 2019-02-19 NOTE — ED Notes (Signed)
Pt did not want to stay to complete ivf. Pt wanted to leave. No vital taken.

## 2019-02-19 NOTE — Discharge Instructions (Signed)
Your potassium was a little bit low today, we have replenished orally and through the IV.  It sounds like you are having a lot of side effects from your medications.  Please call your primary care provider or whoever is prescribing your medicines first thing tomorrow morning to schedule follow-up for medication adjustments.  In the meantime I would recommend decreasing your dose of Seroquel to 100 mg nightly or discontinuing it entirely.  There is some concern that you have been taking your Ativan and Klonopin too frequently as well based on the number of tablets you have left.  Please speak with your psychiatrist and your PCP for further recommendations.  Please call your general surgeon tomorrow to schedule follow-up appointment for reevaluation of your recurrent abdominal wall seroma.  In the meantime eat a diet of bland foods, eat small meals throughout the day as opposed to large meals less frequently.  Drink plenty of fluids.  Return to the emergency department if any concerning signs or symptoms develop such as fevers, persistent vomiting, loss of consciousness, or severe pain.

## 2019-02-22 ENCOUNTER — Other Ambulatory Visit: Payer: Self-pay | Admitting: General Surgery

## 2019-02-24 ENCOUNTER — Encounter (HOSPITAL_COMMUNITY): Payer: Self-pay | Admitting: Emergency Medicine

## 2019-02-24 ENCOUNTER — Emergency Department (HOSPITAL_COMMUNITY): Payer: Medicare Other

## 2019-02-24 ENCOUNTER — Inpatient Hospital Stay (HOSPITAL_COMMUNITY)
Admission: EM | Admit: 2019-02-24 | Discharge: 2019-02-28 | DRG: 872 | Disposition: A | Discharge status: Home / Self Care | Payer: Medicare Other | Attending: Internal Medicine | Admitting: Internal Medicine

## 2019-02-24 DIAGNOSIS — E876 Hypokalemia: Secondary | ICD-10-CM | POA: Diagnosis present

## 2019-02-24 DIAGNOSIS — Z20822 Contact with and (suspected) exposure to covid-19: Secondary | ICD-10-CM | POA: Diagnosis present

## 2019-02-24 DIAGNOSIS — Z8673 Personal history of transient ischemic attack (TIA), and cerebral infarction without residual deficits: Secondary | ICD-10-CM | POA: Diagnosis not present

## 2019-02-24 DIAGNOSIS — K219 Gastro-esophageal reflux disease without esophagitis: Secondary | ICD-10-CM | POA: Diagnosis present

## 2019-02-24 DIAGNOSIS — K5710 Diverticulosis of small intestine without perforation or abscess without bleeding: Secondary | ICD-10-CM | POA: Diagnosis not present

## 2019-02-24 DIAGNOSIS — M1990 Unspecified osteoarthritis, unspecified site: Secondary | ICD-10-CM | POA: Diagnosis present

## 2019-02-24 DIAGNOSIS — Z9884 Bariatric surgery status: Secondary | ICD-10-CM | POA: Diagnosis not present

## 2019-02-24 DIAGNOSIS — Z7982 Long term (current) use of aspirin: Secondary | ICD-10-CM | POA: Diagnosis not present

## 2019-02-24 DIAGNOSIS — B9561 Methicillin susceptible Staphylococcus aureus infection as the cause of diseases classified elsewhere: Secondary | ICD-10-CM | POA: Diagnosis present

## 2019-02-24 DIAGNOSIS — S301XXS Contusion of abdominal wall, sequela: Secondary | ICD-10-CM | POA: Diagnosis not present

## 2019-02-24 DIAGNOSIS — L7634 Postprocedural seroma of skin and subcutaneous tissue following other procedure: Secondary | ICD-10-CM | POA: Diagnosis present

## 2019-02-24 DIAGNOSIS — E872 Acidosis: Secondary | ICD-10-CM | POA: Diagnosis present

## 2019-02-24 DIAGNOSIS — R112 Nausea with vomiting, unspecified: Secondary | ICD-10-CM | POA: Diagnosis not present

## 2019-02-24 DIAGNOSIS — N200 Calculus of kidney: Secondary | ICD-10-CM | POA: Diagnosis present

## 2019-02-24 DIAGNOSIS — F17290 Nicotine dependence, other tobacco product, uncomplicated: Secondary | ICD-10-CM | POA: Diagnosis present

## 2019-02-24 DIAGNOSIS — K8020 Calculus of gallbladder without cholecystitis without obstruction: Secondary | ICD-10-CM | POA: Diagnosis not present

## 2019-02-24 DIAGNOSIS — Y838 Other surgical procedures as the cause of abnormal reaction of the patient, or of later complication, without mention of misadventure at the time of the procedure: Secondary | ICD-10-CM | POA: Diagnosis present

## 2019-02-24 DIAGNOSIS — K91872 Postprocedural seroma of a digestive system organ or structure following a digestive system procedure: Secondary | ICD-10-CM | POA: Diagnosis not present

## 2019-02-24 DIAGNOSIS — F3160 Bipolar disorder, current episode mixed, unspecified: Secondary | ICD-10-CM

## 2019-02-24 DIAGNOSIS — Z6839 Body mass index (BMI) 39.0-39.9, adult: Secondary | ICD-10-CM | POA: Diagnosis not present

## 2019-02-24 DIAGNOSIS — Z560 Unemployment, unspecified: Secondary | ICD-10-CM | POA: Diagnosis not present

## 2019-02-24 DIAGNOSIS — F319 Bipolar disorder, unspecified: Secondary | ICD-10-CM | POA: Diagnosis present

## 2019-02-24 DIAGNOSIS — E440 Moderate protein-calorie malnutrition: Secondary | ICD-10-CM | POA: Diagnosis present

## 2019-02-24 DIAGNOSIS — S301XXA Contusion of abdominal wall, initial encounter: Secondary | ICD-10-CM | POA: Diagnosis present

## 2019-02-24 DIAGNOSIS — J45909 Unspecified asthma, uncomplicated: Secondary | ICD-10-CM | POA: Diagnosis present

## 2019-02-24 DIAGNOSIS — Z03818 Encounter for observation for suspected exposure to other biological agents ruled out: Secondary | ICD-10-CM | POA: Diagnosis not present

## 2019-02-24 DIAGNOSIS — Z79899 Other long term (current) drug therapy: Secondary | ICD-10-CM

## 2019-02-24 DIAGNOSIS — G4733 Obstructive sleep apnea (adult) (pediatric): Secondary | ICD-10-CM | POA: Diagnosis present

## 2019-02-24 DIAGNOSIS — S301XXD Contusion of abdominal wall, subsequent encounter: Secondary | ICD-10-CM

## 2019-02-24 DIAGNOSIS — Z87442 Personal history of urinary calculi: Secondary | ICD-10-CM

## 2019-02-24 DIAGNOSIS — Z825 Family history of asthma and other chronic lower respiratory diseases: Secondary | ICD-10-CM

## 2019-02-24 DIAGNOSIS — A419 Sepsis, unspecified organism: Secondary | ICD-10-CM | POA: Diagnosis not present

## 2019-02-24 DIAGNOSIS — D509 Iron deficiency anemia, unspecified: Secondary | ICD-10-CM | POA: Diagnosis present

## 2019-02-24 DIAGNOSIS — S3011XA Contusion of abdominal wall, initial encounter: Secondary | ICD-10-CM | POA: Diagnosis present

## 2019-02-24 DIAGNOSIS — R000 Tachycardia, unspecified: Secondary | ICD-10-CM | POA: Diagnosis not present

## 2019-02-24 DIAGNOSIS — K651 Peritoneal abscess: Secondary | ICD-10-CM | POA: Diagnosis not present

## 2019-02-24 DIAGNOSIS — E46 Unspecified protein-calorie malnutrition: Secondary | ICD-10-CM | POA: Diagnosis not present

## 2019-02-24 LAB — LACTIC ACID, PLASMA: Lactic Acid, Venous: 2.3 mmol/L (ref 0.5–1.9)

## 2019-02-24 LAB — COMPREHENSIVE METABOLIC PANEL
ALT: 33 U/L (ref 0–44)
AST: 44 U/L — ABNORMAL HIGH (ref 15–41)
Albumin: 2.3 g/dL — ABNORMAL LOW (ref 3.5–5.0)
Alkaline Phosphatase: 113 U/L (ref 38–126)
Anion gap: 16 — ABNORMAL HIGH (ref 5–15)
BUN: 6 mg/dL (ref 6–20)
CO2: 23 mmol/L (ref 22–32)
Calcium: 8.4 mg/dL — ABNORMAL LOW (ref 8.9–10.3)
Chloride: 95 mmol/L — ABNORMAL LOW (ref 98–111)
Creatinine, Ser: 0.64 mg/dL (ref 0.44–1.00)
GFR calc Af Amer: >60 mL/min (ref >60)
GFR calc non Af Amer: >60 mL/min (ref >60)
Glucose, Bld: 131 mg/dL — ABNORMAL HIGH (ref 70–99)
Potassium: 2.7 mmol/L — CL (ref 3.5–5.1)
Sodium: 134 mmol/L — ABNORMAL LOW (ref 135–145)
Total Bilirubin: 0.3 mg/dL (ref 0.3–1.2)
Total Protein: 7.1 g/dL (ref 6.5–8.1)

## 2019-02-24 LAB — RAPID URINE DRUG SCREEN, HOSP PERFORMED
Amphetamines: NOT DETECTED
Barbiturates: NOT DETECTED
Benzodiazepines: POSITIVE — AB
Cocaine: NOT DETECTED
Opiates: NOT DETECTED
Tetrahydrocannabinol: NOT DETECTED

## 2019-02-24 LAB — CBC
HCT: 31.8 % — ABNORMAL LOW (ref 36.0–46.0)
Hemoglobin: 9.4 g/dL — ABNORMAL LOW (ref 12.0–15.0)
MCH: 23.8 pg — ABNORMAL LOW (ref 26.0–34.0)
MCHC: 29.6 g/dL — ABNORMAL LOW (ref 30.0–36.0)
MCV: 80.5 fL (ref 80.0–100.0)
Platelets: 663 10*3/uL — ABNORMAL HIGH (ref 150–400)
RBC: 3.95 MIL/uL (ref 3.87–5.11)
RDW: 14.5 % (ref 11.5–15.5)
WBC: 8.7 10*3/uL (ref 4.0–10.5)
nRBC: 0 % (ref 0.0–0.2)

## 2019-02-24 LAB — URINALYSIS, ROUTINE W REFLEX MICROSCOPIC
Bilirubin Urine: NEGATIVE
Glucose, UA: NEGATIVE mg/dL
Hgb urine dipstick: NEGATIVE
Ketones, ur: NEGATIVE mg/dL
Leukocytes,Ua: NEGATIVE
Nitrite: NEGATIVE
Protein, ur: NEGATIVE mg/dL
Specific Gravity, Urine: 1.013 (ref 1.005–1.030)
pH: 7 (ref 5.0–8.0)

## 2019-02-24 LAB — LIPASE, BLOOD: Lipase: 25 U/L (ref 11–51)

## 2019-02-24 LAB — MAGNESIUM: Magnesium: 1.9 mg/dL (ref 1.7–2.4)

## 2019-02-24 LAB — RESPIRATORY PANEL BY RT PCR (FLU A&B, COVID)
Influenza A by PCR: NEGATIVE
Influenza B by PCR: NEGATIVE
SARS Coronavirus 2 by RT PCR: NEGATIVE

## 2019-02-24 LAB — APTT: aPTT: 59 seconds — ABNORMAL HIGH (ref 24–36)

## 2019-02-24 LAB — I-STAT BETA HCG BLOOD, ED (MC, WL, AP ONLY): I-stat hCG, quantitative: <5 m[IU]/mL (ref <5)

## 2019-02-24 LAB — PROTIME-INR
INR: 2.4 — ABNORMAL HIGH (ref 0.8–1.2)
Prothrombin Time: 26.3 seconds — ABNORMAL HIGH (ref 11.4–15.2)

## 2019-02-24 LAB — SALICYLATE LEVEL: Salicylate Lvl: <7 mg/dL — ABNORMAL LOW (ref 7.0–30.0)

## 2019-02-24 LAB — POC SARS CORONAVIRUS 2 AG -  ED: SARS Coronavirus 2 Ag: NEGATIVE

## 2019-02-24 LAB — ACETAMINOPHEN LEVEL: Acetaminophen (Tylenol), Serum: <10 ug/mL — ABNORMAL LOW (ref 10–30)

## 2019-02-24 LAB — ETHANOL: Alcohol, Ethyl (B): <10 mg/dL (ref <10)

## 2019-02-24 MED ORDER — IOHEXOL 300 MG/ML  SOLN
125.0000 mL | Freq: Once | INTRAMUSCULAR | Status: AC | PRN
  Administered 2019-02-24: 19:00:00 125 mL via INTRAVENOUS

## 2019-02-24 MED ORDER — SODIUM CHLORIDE 0.9 % IV SOLN
2.0000 g | Freq: Once | INTRAVENOUS | Status: AC
  Administered 2019-02-24: 17:00:00 2 g via INTRAVENOUS
  Filled 2019-02-24: qty 2

## 2019-02-24 MED ORDER — MORPHINE SULFATE (PF) 2 MG/ML IV SOLN
2.0000 mg | INTRAVENOUS | Status: DC | PRN
  Administered 2019-02-25 – 2019-02-28 (×14): 2 mg via INTRAVENOUS
  Filled 2019-02-24 (×14): qty 1

## 2019-02-24 MED ORDER — SODIUM CHLORIDE 0.9 % IV BOLUS
1000.0000 mL | Freq: Once | INTRAVENOUS | Status: AC
  Administered 2019-02-24: 18:00:00 1000 mL via INTRAVENOUS

## 2019-02-24 MED ORDER — HEPARIN SODIUM (PORCINE) 5000 UNIT/ML IJ SOLN
5000.0000 [IU] | Freq: Three times a day (TID) | INTRAMUSCULAR | Status: DC
  Administered 2019-02-25: 07:00:00 5000 [IU] via SUBCUTANEOUS
  Filled 2019-02-24: qty 1

## 2019-02-24 MED ORDER — SODIUM CHLORIDE 0.9 % IV SOLN
2.0000 g | Freq: Three times a day (TID) | INTRAVENOUS | Status: DC
  Administered 2019-02-25 – 2019-02-27 (×8): 2 g via INTRAVENOUS
  Filled 2019-02-24 (×8): qty 2

## 2019-02-24 MED ORDER — POTASSIUM CHLORIDE CRYS ER 20 MEQ PO TBCR
40.0000 meq | EXTENDED_RELEASE_TABLET | Freq: Once | ORAL | Status: AC
  Administered 2019-02-24: 16:00:00 40 meq via ORAL
  Filled 2019-02-24: qty 2

## 2019-02-24 MED ORDER — BENZTROPINE MESYLATE 2 MG PO TABS
2.0000 mg | ORAL_TABLET | Freq: Every day | ORAL | Status: DC
  Administered 2019-02-25 – 2019-02-27 (×4): 2 mg via ORAL
  Filled 2019-02-24 (×5): qty 1

## 2019-02-24 MED ORDER — LORAZEPAM 2 MG/ML IJ SOLN
0.5000 mg | Freq: Once | INTRAMUSCULAR | Status: AC
  Administered 2019-02-24: 0.5 mg via INTRAVENOUS
  Filled 2019-02-24: qty 1

## 2019-02-24 MED ORDER — AMPHETAMINE-DEXTROAMPHETAMINE 10 MG PO TABS
15.0000 mg | ORAL_TABLET | Freq: Two times a day (BID) | ORAL | Status: DC
  Administered 2019-02-25 – 2019-02-28 (×7): 15 mg via ORAL
  Filled 2019-02-24 (×7): qty 2

## 2019-02-24 MED ORDER — VANCOMYCIN HCL 1250 MG/250ML IV SOLN
1250.0000 mg | Freq: Two times a day (BID) | INTRAVENOUS | Status: DC
  Administered 2019-02-25 – 2019-02-27 (×5): 1250 mg via INTRAVENOUS
  Filled 2019-02-24 (×6): qty 250

## 2019-02-24 MED ORDER — ACETAMINOPHEN 500 MG PO TABS
1000.0000 mg | ORAL_TABLET | Freq: Once | ORAL | Status: AC
  Administered 2019-02-24: 18:00:00 1000 mg via ORAL
  Filled 2019-02-24: qty 2

## 2019-02-24 MED ORDER — ONDANSETRON HCL 4 MG/2ML IJ SOLN: 4.0000 mg | Freq: Four times a day (QID) | INTRAMUSCULAR | Status: DC | PRN

## 2019-02-24 MED ORDER — METRONIDAZOLE IN NACL 5-0.79 MG/ML-% IV SOLN
500.0000 mg | Freq: Once | INTRAVENOUS | Status: AC
  Administered 2019-02-24: 18:00:00 500 mg via INTRAVENOUS
  Filled 2019-02-24: qty 100

## 2019-02-24 MED ORDER — SODIUM CHLORIDE 0.9 % IV BOLUS
1000.0000 mL | Freq: Once | INTRAVENOUS | Status: AC
  Administered 2019-02-24: 15:00:00 1000 mL via INTRAVENOUS

## 2019-02-24 MED ORDER — LORAZEPAM 1 MG PO TABS
1.0000 mg | ORAL_TABLET | Freq: Every day | ORAL | Status: DC
  Administered 2019-02-25 – 2019-02-28 (×4): 1 mg via ORAL
  Filled 2019-02-24 (×4): qty 1

## 2019-02-24 MED ORDER — QUETIAPINE FUMARATE 300 MG PO TABS
600.0000 mg | ORAL_TABLET | Freq: Every day | ORAL | Status: DC
  Administered 2019-02-25 – 2019-02-27 (×4): 600 mg via ORAL
  Filled 2019-02-24 (×2): qty 2
  Filled 2019-02-24: qty 6
  Filled 2019-02-24 (×2): qty 2
  Filled 2019-02-24 (×2): qty 6
  Filled 2019-02-24: qty 2
  Filled 2019-02-24: qty 6

## 2019-02-24 MED ORDER — POTASSIUM CHLORIDE 10 MEQ/100ML IV SOLN
10.0000 meq | INTRAVENOUS | Status: AC
  Administered 2019-02-24 (×2): 10 meq via INTRAVENOUS
  Filled 2019-02-24 (×2): qty 100

## 2019-02-24 MED ORDER — VANCOMYCIN HCL 2000 MG/400ML IV SOLN
2000.0000 mg | Freq: Once | INTRAVENOUS | Status: AC
  Administered 2019-02-24: 18:00:00 2000 mg via INTRAVENOUS
  Filled 2019-02-24: qty 400

## 2019-02-24 MED ORDER — SODIUM CHLORIDE 0.9% FLUSH
3.0000 mL | Freq: Once | INTRAVENOUS | Status: AC
  Administered 2019-02-24: 15:00:00 3 mL via INTRAVENOUS

## 2019-02-24 MED ORDER — POTASSIUM CHLORIDE 10 MEQ/100ML IV SOLN
10.0000 meq | INTRAVENOUS | Status: AC
  Administered 2019-02-25 (×2): 10 meq via INTRAVENOUS
  Filled 2019-02-24 (×3): qty 100

## 2019-02-24 MED ORDER — ONDANSETRON HCL 4 MG/2ML IJ SOLN
4.0000 mg | Freq: Once | INTRAMUSCULAR | Status: AC
  Administered 2019-02-24: 15:00:00 4 mg via INTRAVENOUS
  Filled 2019-02-24: qty 2

## 2019-02-24 MED ORDER — SODIUM CHLORIDE 0.9 % IV SOLN: INTRAVENOUS | Status: DC

## 2019-02-24 MED ORDER — ONDANSETRON HCL 4 MG PO TABS: 4.0000 mg | ORAL_TABLET | Freq: Four times a day (QID) | ORAL | Status: DC | PRN

## 2019-02-24 NOTE — ED Notes (Signed)
Patient reports N/V for the past month, unable to keep food/drink down. Hx of gastric sleeve and colon dissection. Pt's abdomen is firm and distended, slightly tender to touch. Pt is diaphoretic and pale, tremors noted in hands and arms.

## 2019-02-24 NOTE — Progress Notes (Signed)
Pharmacy Antibiotic Note  [name] is a 44 y.o. female admitted on 02/24/2019 with sepsis.  Pharmacy has been consulted for vancomycin and cefepime dosing. Pt is febrile with Tmax 101.1 and WBC is WNL. SCr is WNL.   Plan: Vancomycin 2gm IV x 1 then 1250mg  IV Q12H Cefepime 2gm IV Q8H  F/u renal fxn, C&S, clinical status and peak/trough at SS    Temp (24hrs), Avg:99.7 F (37.6 C), Min:98.3 F (36.8 C), Max:101.1 F (38.4 C)  Recent Labs  Lab 02/19/19 1316 02/24/19 1328  WBC 8.5 8.7  CREATININE 0.59 0.64    Estimated Creatinine Clearance: 125.8 mL/min (by C-G formula based on SCr of 0.64 mg/dL).    No Known Allergies  Antimicrobials this admission: Vanc 1/1>> Cefepime 1/1>> Flagyl x 1 1/1  Dose adjustments this admission: N/A  Microbiology results: Pending  Thank you for allowing pharmacy to be a part of this patient's care.  [name], [name] 02/24/2019 4:58 PM

## 2019-02-24 NOTE — ED Triage Notes (Signed)
Pt to ER for evaluation of nausea, vomiting, and inability to tolerate PO. Reports has been feeling this way for approximately 1 month. Reports "abdominal wall seroma" that has had to be drained in the past when she feels this way. She is a/o x4.

## 2019-02-24 NOTE — ED Provider Notes (Addendum)
[facility] EMERGENCY DEPARTMENT Provider Note   CSN: [id number] Arrival date & time: 02/24/19  1316     History Chief Complaint  Patient presents with  . Abdominal Pain    [name] is a 44 y.o. female.  HPI   44 year old female with a history of anemia, asthma, bipolar affective disorder, CVA, diverticulitis with perforation requiring resection, OSA, substance use, s/p gastric sleeve surgery with recurrent abdominal wall seroma, who presents to the emergency department today for evaluation of abdominal pain nausea and vomiting.  She states that for the last month she has had the symptoms.  She has not really been able to eat foods or keep water down.  She has had some constipation as well and last BM was 4 days ago.  She denies any diarrhea.  Denies any fevers.  She reports that she feels like her abdominal wall seroma has recurred, she was seen in the ED a few days ago and had a CT scan which did confirm this.  She was recommended to follow-up with her surgeon about this and she states she has an appointment on 2/2 for evaluation of this.  During this visit general surgery was consulted and did not feel that patient required emergent surgery of this that she did not have a leukocytosis, did not have any fevers and was not systemically ill.  Reviewed records from prior visit a few days ago.  At that time there was concern that patient may be inappropriately taking her medications.  She denies taking any extra medications and states that her mother has now been managing her medications for her.  Past Medical History:  Diagnosis Date  . Anemia   . Anxiety   . Arthritis   . Asthma   . Bipolar affective ([place])   . Blood transfusion without reported diagnosis   . Colostomy in place [facility])   . CVA (cerebrovascular accident) ([place])    left cerebellar infarct found accidentally on CT (2020)  . Depression   . Diverticulitis 2008   perforated/ requiring resection  .  Family history of adverse reaction to anesthesia    MGM was placed on vent after surgery  . GERD (gastroesophageal reflux disease)   . Headache   . Shortness of breath dyspnea   . Sleep apnea    mild, does not use c-pap machine  . Substance abuse ([place])    12 years ago-crack cocaine    Patient Active Problem List   Diagnosis Date Noted  . CVA (cerebrovascular accident) ([place])   . Degeneration of lumbar or lumbosacral intervertebral disc 12/13/2014  . Anemia associated with nutritional deficiency 11/12/2014  . Bipolar affective disorder ([place]) 09/28/2014  . Anxiety state 09/28/2014  . S/P laparoscopic sleeve gastrectomy 01/2014 09/28/2014  . Abdominal wall seroma - chronic   . Seroma   . Cholelithiases 04/25/2013  . Cough 09/19/2011  . Smoker 09/19/2011  . GERD 03/04/2010  . RECTAL BLEEDING 03/04/2010  . DYSPHAGIA UNSPECIFIED 03/04/2010  . DYSPHAGIA 03/04/2010  . RUQ PAIN 03/04/2010    Past Surgical History:  Procedure Laterality Date  . Abdominal wall hernia  07/2007   open repair with lysis of adhesions  . COLONOSCOPY  03/13/2010   [id number] hemorrhoids likely cause of hematochezia, otherwise normal  . Colostomy reversal  11/2006  . ESOPHAGOGASTRODUODENOSCOPY  03/13/2010   [id number] esophagus, status post passage of a Maloney dilator/Small hiatal hernia/ Antral erosions, status post biopsy  . EXAMINATION UNDER ANESTHESIA N/A 05/11/2012  Procedure: EXAM UNDER ANESTHESIA;  Surgeon: Donato Heinz, MD;  Location: [facility];  Service: General;  Laterality: N/A;  . Exploratory laparotomy with resection  2008  . FINGER CLOSED REDUCTION Right 08/05/2012   Procedure: CLOSED REDUCTION RIGHT THUMB (FINGER);  Surgeon: Linna Hoff, MD;  Location: [facility];  Service: Orthopedics;  Laterality: Right;  . HERNIA REPAIR    . Hx of abd wall seroma  08/2007   Drained via [place] in [place], [place]  . Hx of abd wall seroma  10/2007   Drained by Dr. Geroge Baseman in office  . Kidney stones     . LAPAROSCOPIC GASTRIC SLEEVE RESECTION N/A 02/13/2014   Procedure: LAPAROSCOPIC GASTRIC SLEEVE RESECTION LYSIS OF ADHESIONS, UPPER ENDOSCOPY;  Surgeon: [name], MD;  Location: [facility];  Service: General;  Laterality: N/A;  . LAPAROSCOPY N/A 09/28/2014   Procedure: LAPAROSCOPY DIAGNOSTIC, INCISION AND DRAINAGE WITH LAPAROSCOPIC EXPLORATION OF ABDOMINAL WALL SEROMA with ultrasound;  Surgeon: Greer Pickerel, MD;  Location: [facility];  Service: General;  Laterality: N/A;  . PLACEMENT OF SETON N/A 05/11/2012   Procedure: PLACEMENT OF SETON;  Surgeon: Donato Heinz, MD;  Location: [facility];  Service: General;  Laterality: N/A;  . root canal 7-16    . TONSILLECTOMY    . TREATMENT FISTULA ANAL       OB History   No obstetric history on file.     Family History  Problem Relation Age of Onset  . Asthma Maternal Grandmother     Social History   Tobacco Use  . Smoking status: Current Every Day Smoker    Packs/day: 0.50    Years: 14.00    Pack years: 7.00    Types: E-cigarettes    Last attempt to quit: 12/20/2013    Years since quitting: 5.1  . Smokeless tobacco: Never Used  . Tobacco comment: Vapor cigarettes.  Substance Use Topics  . Alcohol use: No    Alcohol/week: 0.0 standard drinks  . Drug use: No    Home Medications Prior to Admission medications   Medication Sig Start Date End Date Taking? Authorizing Provider  albuterol (VENTOLIN HFA) 108 (90 Base) MCG/ACT inhaler Inhale 2 puffs into the lungs every 4 (four) hours as needed for wheezing or shortness of breath. 02/01/19   Duncan, Modena Nunnery, MD  amphetamine-dextroamphetamine (ADDERALL) 15 MG tablet Take 15 mg by mouth 2 (two) times daily.    [provider]  aspirin EC 325 MG tablet Take 1 tablet (325 mg total) by mouth daily. 09/08/18   Evalee Jefferson, PA-C  atorvastatin (LIPITOR) 40 MG tablet TAKE 1 TABLET BY MOUTH EVERY DAY 02/13/19   Susy Frizzle, MD  azithromycin [facility]) 250 MG tablet 2 tabs poqday1, 1 tab  poqday 2-5 02/09/19   Susy Frizzle, MD  benzonatate (TESSALON) 200 MG capsule Take 1 capsule (200 mg total) by mouth 3 (three) times daily as needed for cough. 02/06/19   Susy Frizzle, MD  benztropine (COGENTIN) 2 MG tablet Take 2 mg by mouth at bedtime.    [provider]  clonazePAM (KLONOPIN) 1 MG tablet Take 2 mg by mouth daily.     [provider]  Diphenhyd-Hydrocort-Nystatin (FIRST-DUKES MOUTHWASH) SUSP Swish and swallow 40ml po TID prn mouth pain/sores for 10 days 02/01/19   Alycia Rossetti, MD  gabapentin (NEURONTIN) 300 MG capsule Take 300 mg by mouth 3 (three) times daily. 12/23/18   [provider]  haloperidol (HALDOL) 5 MG tablet 1  tab in AM and 3 tab at [facility] 11/21/14   [provider]  HYDROcodone-homatropine (HYCODAN) 5-1.5 MG/5ML syrup Take 5 mLs by mouth every 8 (eight) hours as needed for cough. 02/09/19   Susy Frizzle, MD  LORazepam (ATIVAN) 1 MG tablet Take 1 mg by mouth daily.    [provider]  NUVIGIL 250 MG tablet Take 250 mg by mouth daily. 11/14/14   [provider]  ondansetron (ZOFRAN ODT) 4 MG disintegrating tablet Take 1 tablet (4 mg total) by mouth every 8 (eight) hours as needed for nausea or vomiting. 02/06/19   Susy Frizzle, MD  QUEtiapine (SEROQUEL) 300 MG tablet Take 600 mg by mouth at bedtime.  11/21/14   [provider]  topiramate (TOPAMAX) 100 MG tablet Take 100 mg by mouth daily. 12/31/18   [provider]  valACYclovir (VALTREX) 500 MG tablet Take 1 tablet (500 mg total) by mouth daily as needed (fever blister). 01/09/19   Susy Frizzle, MD    Allergies    Patient has no known allergies.  Review of Systems   Review of Systems  Constitutional: Negative for chills and fever.  HENT: Negative for ear pain and sore throat.   Eyes: Negative for visual disturbance.  Respiratory: Negative for cough.   Cardiovascular: Negative for chest pain.  Gastrointestinal:  Positive for abdominal pain, constipation and vomiting. Negative for diarrhea.  Genitourinary: Negative for dysuria and hematuria.  Musculoskeletal: Negative for back pain.  Skin: Negative for color change and rash.  Neurological: Negative for headaches.  All other systems reviewed and are negative.   Physical Exam Updated Vital Signs BP 126/68   Pulse (!) 121   Temp (!) 101.1 F (38.4 C) (Rectal)   Resp 19   Ht 5\' 7"  (1.702 m)   Wt 113.4 kg   LMP 02/14/2019 (LMP Unknown)   SpO2 100%   BMI 39.16 kg/m   Physical Exam Vitals and nursing note reviewed.  Constitutional:      General: She is not in acute distress.    Appearance: She is well-developed.     Comments: tremulous  HENT:     Head: Normocephalic and atraumatic.  Eyes:     Conjunctiva/sclera: Conjunctivae normal.  Cardiovascular:     Rate and Rhythm: Regular rhythm. Tachycardia present.     Heart sounds: Normal heart sounds. No murmur.  Pulmonary:     Effort: Pulmonary effort is normal. No respiratory distress.     Breath sounds: Normal breath sounds. No wheezing, rhonchi or rales.  Abdominal:     Palpations: Abdomen is soft.     Tenderness: There is no guarding or rebound.     Comments: Large, firm, nontender seroma to the lower abdomen with large central scar. No erythema or warmth. The abdomen is otherwise soft and nontender  Musculoskeletal:     Cervical back: Neck supple.  Skin:    General: Skin is warm and dry.  Neurological:     Mental Status: She is alert.     ED Results / Procedures / Treatments   Labs (all labs ordered are listed, but only abnormal results are displayed) Labs Reviewed  COMPREHENSIVE METABOLIC PANEL - Abnormal; Notable for the following components:      Result Value   Sodium 134 (*)    Potassium 2.7 (*)    Chloride 95 (*)    Glucose, Bld 131 (*)    Calcium 8.4 (*)    Albumin 2.3 (*)  AST 44 (*)    Anion gap 16 (*)    All other components within normal limits  CBC -  Abnormal; Notable for the following components:   Hemoglobin 9.4 (*)    HCT 31.8 (*)    MCH 23.8 (*)    MCHC 29.6 (*)    Platelets 663 (*)    All other components within normal limits  RAPID URINE DRUG SCREEN, HOSP PERFORMED - Abnormal; Notable for the following components:   Benzodiazepines POSITIVE (*)    All other components within normal limits  ACETAMINOPHEN LEVEL - Abnormal; Notable for the following components:   Acetaminophen (Tylenol), Serum <10 (*)    All other components within normal limits  SALICYLATE LEVEL - Abnormal; Notable for the following components:   Salicylate Lvl [id number] (*)    All other components within normal limits  LACTIC ACID, PLASMA - Abnormal; Notable for the following components:   Lactic Acid, Venous 2.3 (*)    All other components within normal limits  APTT - Abnormal; Notable for the following components:   aPTT 59 (*)    All other components within normal limits  PROTIME-INR - Abnormal; Notable for the following components:   Prothrombin Time 26.3 (*)    INR 2.4 (*)    All other components within normal limits  CULTURE, BLOOD (ROUTINE X 2)  CULTURE, BLOOD (ROUTINE X 2)  URINE CULTURE  LIPASE, BLOOD  URINALYSIS, ROUTINE W REFLEX MICROSCOPIC  ETHANOL  MAGNESIUM  LACTIC ACID, PLASMA  I-STAT BETA HCG BLOOD, ED ([facility], [facility], [facility] ONLY)  POC SARS CORONAVIRUS 2 AG -  ED    EKG None  Radiology DG Chest Port 1 View  Result Date: 02/24/2019 CLINICAL DATA:  Pt to ER for evaluation of nausea, vomiting, and inability to tolerate PO. Reports has been feeling this way for approximately 1 month. EXAM: PORTABLE CHEST 1 VIEW COMPARISON:  None. FINDINGS: Stable cardiomediastinal contours. Low lung volumes. Bronchovascular crowding. No new focal infiltrate. No pneumothorax or large pleural effusion. No acute finding in the visualized skeleton. IMPRESSION: Low volume study with bronchovascular crowding. No definite new infiltrate. Electronically Signed   By: Audie Pinto M.D.   On: 02/24/2019 17:20    Procedures Procedures (including critical care time) CRITICAL CARE Performed by: Rodney Booze   Total critical care time: 33 minutes  Critical care time was exclusive of separately billable procedures and treating other patients.  Critical care was necessary to treat or prevent imminent or life-threatening deterioration.  Critical care was time spent personally by me on the following activities: development of treatment plan with patient and/or surrogate as well as nursing, discussions with consultants, evaluation of patient's response to treatment, examination of patient, obtaining history from patient or surrogate, ordering and performing treatments and interventions, ordering and review of laboratory studies, ordering and review of radiographic studies, pulse oximetry and re-evaluation of patient's condition.    Medications Ordered in ED Medications  vancomycin (VANCOREADY) IVPB 2000 mg/400 mL (2,000 mg Intravenous New Bag/Given 02/24/19 1802)  vancomycin (VANCOREADY) IVPB 1250 mg/250 mL (has no administration in time range)  ceFEPIme (MAXIPIME) 2 g in sodium chloride 0.9 % 100 mL IVPB (has no administration in time range)  sodium chloride flush (NS) 0.9 % injection 3 mL (3 mLs Intravenous Given 02/24/19 1528)  sodium chloride 0.9 % bolus 1,000 mL (1,000 mLs Intravenous New Bag/Given 02/24/19 1527)  ondansetron (ZOFRAN) injection 4 mg (4 mg Intravenous Given 02/24/19 1527)  potassium chloride SA (KLOR-CON)  CR tablet 40 mEq (40 mEq Oral Given 02/24/19 1547)  LORazepam (ATIVAN) injection 0.5 mg (0.5 mg Intravenous Given 02/24/19 1553)  potassium chloride 10 mEq in 100 mL IVPB (10 mEq Intravenous New Bag/Given 02/24/19 1732)  metroNIDAZOLE (FLAGYL) IVPB 500 mg (500 mg Intravenous New Bag/Given 02/24/19 1806)  acetaminophen (TYLENOL) tablet 1,000 mg (1,000 mg Oral Given 02/24/19 1739)  sodium chloride 0.9 % bolus 1,000 mL (1,000 mLs Intravenous New Bag/Given  02/24/19 1820)  ceFEPIme (MAXIPIME) 2 g in sodium chloride 0.9 % 100 mL IVPB (0 g Intravenous Stopped 02/24/19 1804)  LORazepam (ATIVAN) injection 0.5 mg (0.5 mg Intravenous Given 02/24/19 1754)    ED Course  I have reviewed the triage vital signs and the nursing notes.  Pertinent labs & imaging results that were available during my care of the patient were reviewed by me and considered in my medical decision making (see chart for details).    MDM Rules/Calculators/A&P                      44 y/o F presenting to the ED with NV and some abd pain. Has h/o recurrent abd wall seroma that she states has recently recurred over the last month and a half.   Pt with tachycardia, fevers, with possible intraabdominal source of infection. Code sepsis initiated. abx and ivf given.   CBC is without leukocytosis, she does have anemia which appears stable from 1 week ago. CMP with mild hyponatremia and hypochloremia.  Potassium is low at 2.7. liver enzymes mildly elevated, improving from prior. Normal bilirubin.   - hypokalemia: she does have borderline prolonged QT on EKG.  P.o. and IV potassium given. Lipase negative Beta hcg neg Acetaminophen, salicylate, and etoh levels are negative.  UA neg for UTI.  Lactic acid elevated Coags with elevated INR and aPTT  POC COVID negative, send out covid pending.   5:50 PM Rechecked pt. HR remains in the 120s. Satting at 100 on RA. No hypotension. Requesting ativan.   CXR with low volume study with bronchovascular crowding. No definite new infiltrate.  At shift change, care transitioned to [facility], PA-C pending CT abd/pelvis. Anticipate admission and that pt will require gen surg consult for the seroma that is likely infected causing her fevers and tachycardia today.   Final Clinical Impression(s) / ED Diagnoses Final diagnoses:  None    Rx / DC Orders ED Discharge Orders    None       Rodney Booze, PA-C 02/24/19 1908    Bishop [place] 02/24/19 1908    Lucrezia Starch, MD 02/25/19 [phone]
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
## 2019-02-24 NOTE — Sepsis Progress Note (Signed)
Notified bedside nurse of need to draw lactic acid and blood cultures.  

## 2019-02-25 ENCOUNTER — Inpatient Hospital Stay (HOSPITAL_COMMUNITY): Payer: Medicare Other

## 2019-02-25 ENCOUNTER — Other Ambulatory Visit: Payer: Self-pay

## 2019-02-25 DIAGNOSIS — E876 Hypokalemia: Secondary | ICD-10-CM

## 2019-02-25 LAB — IRON AND TIBC
Iron: 9 ug/dL — ABNORMAL LOW (ref 28–170)
Saturation Ratios: 6 % — ABNORMAL LOW (ref 10.4–31.8)
TIBC: 157 ug/dL — ABNORMAL LOW (ref 250–450)
UIBC: 148 ug/dL

## 2019-02-25 LAB — RETICULOCYTES
Immature Retic Fract: 24.9 % — ABNORMAL HIGH (ref 2.3–15.9)
RBC.: 3.2 MIL/uL — ABNORMAL LOW (ref 3.87–5.11)
Retic Count, Absolute: 43.2 10*3/uL (ref 19.0–186.0)
Retic Ct Pct: 1.4 % (ref 0.4–3.1)

## 2019-02-25 LAB — COMPREHENSIVE METABOLIC PANEL
ALT: 29 U/L (ref 0–44)
AST: 39 U/L (ref 15–41)
Albumin: 2.2 g/dL — ABNORMAL LOW (ref 3.5–5.0)
Alkaline Phosphatase: 106 U/L (ref 38–126)
Anion gap: 13 (ref 5–15)
BUN: 5 mg/dL — ABNORMAL LOW (ref 6–20)
CO2: 19 mmol/L — ABNORMAL LOW (ref 22–32)
Calcium: 8.1 mg/dL — ABNORMAL LOW (ref 8.9–10.3)
Chloride: 102 mmol/L (ref 98–111)
Creatinine, Ser: 0.63 mg/dL (ref 0.44–1.00)
GFR calc Af Amer: >60 mL/min (ref >60)
GFR calc non Af Amer: >60 mL/min (ref >60)
Glucose, Bld: 91 mg/dL (ref 70–99)
Potassium: 3.6 mmol/L (ref 3.5–5.1)
Sodium: 134 mmol/L — ABNORMAL LOW (ref 135–145)
Total Bilirubin: 0.4 mg/dL (ref 0.3–1.2)
Total Protein: 6.7 g/dL (ref 6.5–8.1)

## 2019-02-25 LAB — VITAMIN B12: Vitamin B-12: 602 pg/mL (ref 180–914)

## 2019-02-25 LAB — VITAMIN D 25 HYDROXY (VIT D DEFICIENCY, FRACTURES): Vit D, 25-Hydroxy: 20.98 ng/mL — ABNORMAL LOW (ref 30–100)

## 2019-02-25 LAB — CBC
HCT: 27.8 % — ABNORMAL LOW (ref 36.0–46.0)
Hemoglobin: 8.5 g/dL — ABNORMAL LOW (ref 12.0–15.0)
MCH: 24.1 pg — ABNORMAL LOW (ref 26.0–34.0)
MCHC: 30.6 g/dL (ref 30.0–36.0)
MCV: 78.8 fL — ABNORMAL LOW (ref 80.0–100.0)
Platelets: 524 10*3/uL — ABNORMAL HIGH (ref 150–400)
RBC: 3.53 MIL/uL — ABNORMAL LOW (ref 3.87–5.11)
RDW: 14.8 % (ref 11.5–15.5)
WBC: 8.4 10*3/uL (ref 4.0–10.5)
nRBC: 0 % (ref 0.0–0.2)

## 2019-02-25 LAB — DIC (DISSEMINATED INTRAVASCULAR COAGULATION)PANEL
D-Dimer, Quant: 1.12 ug/mL-FEU — ABNORMAL HIGH (ref 0.00–0.50)
Fibrinogen: >800 mg/dL — ABNORMAL HIGH (ref 210–475)
INR: 2.5 — ABNORMAL HIGH (ref 0.8–1.2)
Platelets: 561 10*3/uL — ABNORMAL HIGH (ref 150–400)
Prothrombin Time: 26.7 seconds — ABNORMAL HIGH (ref 11.4–15.2)
Smear Review: NONE SEEN
aPTT: 51 seconds — ABNORMAL HIGH (ref 24–36)

## 2019-02-25 LAB — URINE CULTURE

## 2019-02-25 LAB — FOLATE: Folate: 8.7 ng/mL (ref >5.9)

## 2019-02-25 LAB — FERRITIN: Ferritin: 212 ng/mL (ref 11–307)

## 2019-02-25 LAB — PREALBUMIN: Prealbumin: 6.5 mg/dL — ABNORMAL LOW (ref 18–38)

## 2019-02-25 LAB — HIV ANTIBODY (ROUTINE TESTING W REFLEX): HIV Screen 4th Generation wRfx: NONREACTIVE

## 2019-02-25 LAB — LACTIC ACID, PLASMA: Lactic Acid, Venous: 1.3 mmol/L (ref 0.5–1.9)

## 2019-02-25 MED ORDER — ARMODAFINIL 250 MG PO TABS: 250.0000 mg | ORAL_TABLET | Freq: Every day | ORAL | Status: DC

## 2019-02-25 MED ORDER — GABAPENTIN 300 MG PO CAPS
300.0000 mg | ORAL_CAPSULE | Freq: Three times a day (TID) | ORAL | Status: DC
  Administered 2019-02-25 – 2019-02-28 (×10): 300 mg via ORAL
  Filled 2019-02-25 (×10): qty 1

## 2019-02-25 MED ORDER — LIDOCAINE HCL 1 % IJ SOLN
INTRAMUSCULAR | Status: AC
  Filled 2019-02-25: qty 20

## 2019-02-25 MED ORDER — LORAZEPAM 2 MG/ML IJ SOLN
0.5000 mg | Freq: Once | INTRAMUSCULAR | Status: AC
  Administered 2019-02-25: 0.5 mg via INTRAVENOUS
  Filled 2019-02-25: qty 1

## 2019-02-25 MED ORDER — ACETAMINOPHEN 325 MG PO TABS
650.0000 mg | ORAL_TABLET | Freq: Four times a day (QID) | ORAL | Status: DC | PRN
  Administered 2019-02-25 (×2): 650 mg via ORAL
  Filled 2019-02-25 (×2): qty 2

## 2019-02-25 MED ORDER — POTASSIUM CHLORIDE 10 MEQ/100ML IV SOLN
10.0000 meq | Freq: Once | INTRAVENOUS | Status: AC
  Administered 2019-02-25: 06:00:00 10 meq via INTRAVENOUS

## 2019-02-25 MED ORDER — SODIUM CHLORIDE 0.9 % IV SOLN
510.0000 mg | Freq: Once | INTRAVENOUS | Status: AC
  Administered 2019-02-25: 14:00:00 510 mg via INTRAVENOUS
  Filled 2019-02-25: qty 17

## 2019-02-25 MED ORDER — FENTANYL CITRATE (PF) 100 MCG/2ML IJ SOLN
INTRAMUSCULAR | Status: AC
  Filled 2019-02-25: qty 4

## 2019-02-25 MED ORDER — MODAFINIL 100 MG PO TABS
200.0000 mg | ORAL_TABLET | Freq: Every day | ORAL | Status: DC
  Administered 2019-02-25 – 2019-02-28 (×4): 200 mg via ORAL
  Filled 2019-02-25 (×4): qty 2

## 2019-02-25 MED ORDER — BOOST / RESOURCE BREEZE PO LIQD CUSTOM
1.0000 | Freq: Three times a day (TID) | ORAL | Status: DC
  Administered 2019-02-25 – 2019-02-26 (×3): 1 via ORAL
  Filled 2019-02-25 (×2): qty 1

## 2019-02-25 MED ORDER — MIDAZOLAM HCL 2 MG/2ML IJ SOLN
INTRAMUSCULAR | Status: AC | PRN
  Administered 2019-02-25: 1 mg via INTRAVENOUS

## 2019-02-25 MED ORDER — TOPIRAMATE 100 MG PO TABS
100.0000 mg | ORAL_TABLET | Freq: Every day | ORAL | Status: DC
  Administered 2019-02-25 – 2019-02-27 (×3): 100 mg via ORAL
  Filled 2019-02-25 (×3): qty 1

## 2019-02-25 MED ORDER — THIAMINE HCL 100 MG/ML IJ SOLN
Freq: Once | INTRAVENOUS | Status: AC
  Filled 2019-02-25: qty 1000

## 2019-02-25 MED ORDER — FENTANYL CITRATE (PF) 100 MCG/2ML IJ SOLN
INTRAMUSCULAR | Status: AC | PRN
  Administered 2019-02-25: 50 ug via INTRAVENOUS

## 2019-02-25 MED ORDER — HALOPERIDOL 5 MG PO TABS
15.0000 mg | ORAL_TABLET | Freq: Every day | ORAL | Status: DC
  Administered 2019-02-25 – 2019-02-27 (×3): 15 mg via ORAL
  Filled 2019-02-25 (×4): qty 3

## 2019-02-25 MED ORDER — ADULT MULTIVITAMIN W/MINERALS CH
1.0000 | ORAL_TABLET | Freq: Every day | ORAL | Status: DC
  Administered 2019-02-25 – 2019-02-26 (×2): 1 via ORAL
  Filled 2019-02-25 (×2): qty 1

## 2019-02-25 MED ORDER — LIDOCAINE-EPINEPHRINE 1 %-1:100000 IJ SOLN
INTRAMUSCULAR | Status: AC
  Filled 2019-02-25: qty 1

## 2019-02-25 MED ORDER — ALBUTEROL SULFATE (2.5 MG/3ML) 0.083% IN NEBU: 3.0000 mL | INHALATION_SOLUTION | RESPIRATORY_TRACT | Status: DC | PRN

## 2019-02-25 MED ORDER — SODIUM CHLORIDE 0.9 % IV BOLUS
500.0000 mL | Freq: Once | INTRAVENOUS | Status: AC
  Administered 2019-02-25: 500 mL via INTRAVENOUS

## 2019-02-25 MED ORDER — MIDAZOLAM HCL 2 MG/2ML IJ SOLN
INTRAMUSCULAR | Status: AC
  Filled 2019-02-25: qty 6

## 2019-02-25 NOTE — Procedures (Signed)
Pre procedural Dx: Infection of chronic recurrent abdominal wall seroma.  Post procedural Dx: Same  Technically successful CT guided placed of a 12 Fr drainage catheter placement into the large recurrent abdominal wall seroma yielding 2.4 L of red tinged thick fluid.    A representative aspirated sample was capped and sent to the laboratory for analysis.    EBL: None Complications: None immediate  Ronny Bacon, MD Pager #: [phone]

## 2019-02-25 NOTE — Plan of Care (Signed)

## 2019-02-25 NOTE — Plan of Care (Signed)

## 2019-02-25 NOTE — Plan of Care (Signed)
  Problem: Education: Goal: Knowledge of General Education information will improve Description: Including pain rating scale, medication(s)/side effects and non-pharmacologic comfort measures Outcome: Progressing   Problem: Activity: Goal: Risk for activity intolerance will decrease Outcome: Progressing   Problem: Coping: Goal: Level of anxiety will decrease Outcome: Progressing   Problem: Elimination: Goal: Will not experience complications related to bowel motility Outcome: Progressing   Problem: Safety: Goal: Ability to remain free from injury will improve Outcome: Progressing   Problem: Skin Integrity: Goal: Risk for impaired skin integrity will decrease Outcome: Progressing   

## 2019-02-25 NOTE — Progress Notes (Signed)
Initial Nutrition Assessment  DOCUMENTATION CODES:   Obesity unspecified  INTERVENTION:   Recommend iron and vitamin D supplementation.  Boost Breeze po TID, each supplement provides 250 kcal and 9 grams of protein.  RD to follow and monitor for micronutrient deficiencies.  NUTRITION DIAGNOSIS:   Inadequate oral intake related to poor appetite, nausea, vomiting as evidenced by per patient/family report.  GOAL:   Patient will meet greater than or equal to 90% of their needs  MONITOR:   Diet advancement, PO intake, Supplement acceptance, Labs, Skin  REASON FOR ASSESSMENT:   Malnutrition Screening Tool    ASSESSMENT:   44 yo female admitted with infection of chronic recurrent abdominal wall seroma. PMH includes lap sleeve gastrectomy 01/2014, GERD, diverticulitis, colostomy, Bipolar D/O, asthma, substance abuse, sleep apnea, CVA.   RD working remotely.  S/P drain placement today with 2.4 L of red tinged thick fluid removed.   Patient with 9% weight loss within the past 1.5 months, which is significant for the time frame.  Currently on clear liquids.   Unable to speak with patient at this time, no answer when room number called. Per review of physician notes, patient has not had bariatric F/U in several years. She has trouble following a diet and taking vitamins. She has had altered taste. For the past month, she has only been able to eat 2 yogurts per day and drink water. Everything else caused vomiting.   Labs reviewed. Sodium 134 (L), BUN 5 (L), prealbumin 6.5 (L) Vitamin D 20.98 (L) Folate WNL Ferritin WNL Vitamin B-12 WNL Iron and TIBC low  Medications reviewed.  NUTRITION - FOCUSED PHYSICAL EXAM:  unable to complete  Diet Order:   Diet Order            Diet clear liquid Room service appropriate? Yes; Fluid consistency: Thin  Diet effective now              EDUCATION NEEDS:   Not appropriate for education at this time  Skin:  Skin Assessment:  Reviewed RN Assessment  Last BM:  12/26  Height:   Ht Readings from Last 1 Encounters:  02/24/19 5\' 7"  (1.702 m)    Weight:   Wt Readings from Last 1 Encounters:  02/24/19 113.4 kg    Ideal Body Weight:  61.4 kg  BMI:  Body mass index is 39.16 kg/m.  Estimated Nutritional Needs:   Kcal:  1800-2000  Protein:  110-130 gm  Fluid:  >/= 2 L    Molli Barrows, RD, LDN, [place] Pager [phone] After Hours Pager [phone]

## 2019-02-25 NOTE — Progress Notes (Signed)
Once pt got to the floor, RN notified [facility] on -call MD of Pt VS after code sepsis in the ED.  Order received. Will continue to monitor.

## 2019-02-25 NOTE — Progress Notes (Signed)
PHARMACY - PHYSICIAN COMMUNICATION CRITICAL VALUE ALERT - BLOOD CULTURE IDENTIFICATION (BCID)  AGOSTINA ELIASON is an 44 y.o. female who presented to [facility] on 02/24/2019 with a chief complaint of abdominal pain and fever.  Pt has had multiple abdominal surgeries and recurrent seroma with multiple past drainages.   Assessment:  Presents with sepsis, Tm 101.1 and elevated lactic acid.  Evaluated by surgery and started on empiric IV abx.  Blood cx with gram stain gram + cocci in 1/4 bottles.  No BCID performed.  Likely contaminant.  Name of physician (or Provider) Contacted: N/A  Current antibiotics: Vancomycin and Cefepime   Changes to prescribed antibiotics recommended:  Patient is on recommended antibiotics - No changes needed    Alayzha An, [place] 02/25/2019  4:31 PM
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
## 2019-02-25 NOTE — Progress Notes (Addendum)
[place] d/t increase pulse and respirations. [place] Gully stated it is her baseline. Will continue to monitor. Pt currently off floor for xray.  1237-Pt arrived back on the floor. Pt states she feels a lot better now that some fluid was removed. Pt mews went from red back to yellow. [place] aware of elevated pulse and respirations d/t the pt being a little anxious. Charge nurse Sharyn Lull called rapid response to let them know about the patient's current status. Rapid response stated they would round on her.   Will continue to monitor.
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
## 2019-02-25 NOTE — Progress Notes (Addendum)
Patient ID: [name], female   DOB: 16-May-1975, 44 y.o.   MRN: [id number]   Acute Care Surgery Service Progress Note:    Chief Complaint/Subjective: States she has only been able to eat 2 yogurts per day and drink water for past month; o/w vomits.  Has not been taking MVI or calcium Initially thought she had covid Things haven't been tasting right  Objective: Vital signs in last 24 hours: Temp:  [98.3 F (36.8 C)-101.1 F (38.4 C)] 98.5 F (36.9 C) (01/02 0820) Pulse Rate:  [106-133] 120 (01/02 0820) Resp:  [18-35] 20 (01/02 0820) BP: (120-152)/(66-103) 131/89 (01/02 0820) SpO2:  [90 %-100 %] 100 % (01/02 0820) Weight:  [113.4 kg] 113.4 kg (01/01 1735) Last BM Date: 02/18/19  Intake/Output from previous day: 01/01 0701 - 01/02 0700 In: 3910.9 [I.V.:74.2; IV Piggyback:3836.6] Out: -  Intake/Output this shift: No intake/output data recorded.  Lungs: cta, nonlabored  Cardiovascular: reg  Abd: soft, old incisions, large soft tissue seroma, no cellulitis.   Extremities: no edema, +SCDs  Neuro: alert, nonfocal  Lab Results: CBC  Recent Labs    02/24/19 1328 02/24/19 2322 02/25/19 0018  WBC 8.7  --  8.4  HGB 9.4*  --  8.5*  HCT 31.8*  --  27.8*  PLT 663* 561* 524*   BMET Recent Labs    02/24/19 1328 02/25/19 0018  NA 134* 134*  K 2.7* 3.6  CL 95* 102  CO2 23 19*  GLUCOSE 131* 91  BUN 6 5*  CREATININE 0.64 0.63  CALCIUM 8.4* 8.1*   LFT Hepatic Function Latest Ref Rng & Units 02/25/2019 02/24/2019 02/19/2019  Total Protein 6.5 - 8.1 g/dL 6.7 7.1 7.4  Albumin 3.5 - 5.0 g/dL 2.2(L) 2.3(L) 2.8(L)  AST 15 - 41 U/L 39 44(H) 51(H)  ALT 0 - 44 U/L 29 33 48(H)  Alk Phosphatase 38 - 126 U/L 106 113 124  Total Bilirubin 0.3 - 1.2 mg/dL 0.4 0.3 0.7   PT/INR Recent Labs    02/24/19 1740 02/24/19 2322  LABPROT 26.3* 26.7*  INR 2.4* 2.5*   ABG No results for input(s): PHART, HCO3 in the last 72 hours.  Invalid input(s): PCO2,  PO2  Studies/Results:  Anti-infectives: Anti-infectives (From admission, onward)   Start     Dose/Rate Route Frequency Ordered Stop   02/25/19 0600  vancomycin (VANCOREADY) IVPB 1250 mg/250 mL     1,250 mg 166.7 mL/hr over 90 Minutes Intravenous Every 12 hours 02/24/19 1657     02/25/19 0200  ceFEPIme (MAXIPIME) 2 g in sodium chloride 0.9 % 100 mL IVPB     2 g 200 mL/hr over 30 Minutes Intravenous Every 8 hours 02/24/19 1657     02/24/19 1700  metroNIDAZOLE (FLAGYL) IVPB 500 mg     500 mg 100 mL/hr over 60 Minutes Intravenous  Once 02/24/19 1650 02/24/19 2035   02/24/19 1700  ceFEPIme (MAXIPIME) 2 g in sodium chloride 0.9 % 100 mL IVPB     2 g 200 mL/hr over 30 Minutes Intravenous  Once 02/24/19 1652 02/24/19 1804   02/24/19 1700  vancomycin (VANCOREADY) IVPB 2000 mg/400 mL     2,000 mg 200 mL/hr over 120 Minutes Intravenous  Once 02/24/19 1652 02/24/19 2247      Medications: Scheduled Meds: . amphetamine-dextroamphetamine  15 mg Oral BID AC  . benztropine  2 mg Oral QHS  . gabapentin  300 mg Oral TID  . haloperidol  15 mg Oral QHS  . LORazepam  1 mg Oral Daily  . modafinil  200 mg Oral Daily  . QUEtiapine  600 mg Oral QHS  . topiramate  100 mg Oral QHS   Continuous Infusions: . sodium chloride 100 mL/hr at 02/25/19 0050  . ceFEPime (MAXIPIME) IV 2 g (02/25/19 0217)  . banana bag IV 1000 mL    . vancomycin 1,250 mg (02/25/19 0705)   PRN Meds:.acetaminophen, albuterol, morphine injection, ondansetron **OR** ondansetron (ZOFRAN) IV  Assessment/Plan: Patient Active Problem List   Diagnosis Date Noted  . Hypokalemia 02/24/2019  . Sepsis ([place]) 02/24/2019  . CVA (cerebrovascular accident) ([place])   . Degeneration of lumbar or lumbosacral intervertebral disc 12/13/2014  . Anemia associated with nutritional deficiency 11/12/2014  . Bipolar affective disorder ([place]) 09/28/2014  . Anxiety state 09/28/2014  . S/P laparoscopic sleeve gastrectomy 01/2014 09/28/2014  . Abdominal  wall seroma - chronic   . Seroma   . Cholelithiases 04/25/2013  . Cough 09/19/2011  . Smoker 09/19/2011  . GERD 03/04/2010  . RECTAL BLEEDING 03/04/2010  . DYSPHAGIA UNSPECIFIED 03/04/2010  . DYSPHAGIA 03/04/2010  . RUQ PAIN 03/04/2010   Recurrent chronic abdominal wall seroma Bipolar d/o H/o lap sleeve gastrectomy 01/2014 H/o perf diverticulitis s/p hartmann (~2004) and later reversal H/o ventral incisional hernia repair at [employer] many years complicated by chronic seroma SIRs >1 mo nausea/intermittent vomiting Anemia - etiology unknown Protein calorie malnutrition  - alb 2.8 Elevated INR  I am very familiar with this patient as we go back approximately 5.5 years however she has not been in for bariatric follow-up in several years- I think late 2017 or 2018. She unfortunately went into a severe manic episode after her sleeve gastrectomy ultimately requiring psychiatric hospitalization.   This seroma is due to her open incisional hernia repair performed many years ago.  It has been percutaneously drained numerous times.  Generally when it recurs it will cause the patient to have abdominal discomfort as well as trouble with oral intake-therefore I believe it needs to be percutaneously drained again.  We had made arrangements for this to be drained percutaneously as an outpatient early next week.  I believe her elevated INR and other abnormal coags is probably due to malnutrition.  I have ordered a banana bag given her multiple episodes of emesis over the past month and noncompliance with her multivitamin  Check vitamin levels Check anemia panel Check prealbumin Percutaneously drain recurrent chronic abdominal wall seroma-  send fluid for culture Recommend nutrition consult on Monday - her compliance with a bariatric meal plan unfortunately has always been a challenge.  Can resume diet after percutaneous drain placement (can have diet today if IR not going to drain  today)  Discussed with [facility]  Disposition:  LOS: 1 day    Leighton Ruff. Redmond Pulling, MD, FACS General, Bariatric, & Minimally Invasive Surgery [phone] [facility]

## 2019-02-25 NOTE — Progress Notes (Addendum)
Rapid Response Rounding Note Received call from charge RN at 12:31 that patient was a red MEWS. At 12:27, vital signs where documented and patient's MEWS score was now a yellow 3. Charge RN confirmed that patient's MEWS was not an acute change and rapid response was not needed at this time. Per primary RN notes, the MD has been made aware of the patient's increased respirations and heart rate.  While rounding at approximately 13:00, I stepped into patient's room to find her resting. She easily awoke and engaged in conversation. Pt able to speak in complete sentences. HR was 118 bpm and oxygen saturation was 96%. Patient appeared to be in no distress.   RN to call rapid response for acute change or for additional needs.

## 2019-02-26 LAB — VANCOMYCIN, PEAK: Vancomycin Pk: 36 ug/mL (ref 30–40)

## 2019-02-26 MED ORDER — TAB-A-VITE/IRON PO TABS
1.0000 | ORAL_TABLET | Freq: Every day | ORAL | Status: DC
  Administered 2019-02-26 – 2019-02-28 (×3): 1 via ORAL
  Filled 2019-02-26 (×3): qty 1

## 2019-02-26 MED ORDER — ENSURE ENLIVE PO LIQD
237.0000 mL | Freq: Two times a day (BID) | ORAL | Status: DC
  Administered 2019-02-27 – 2019-02-28 (×2): 237 mL via ORAL

## 2019-02-26 MED ORDER — VITAMIN D 25 MCG (1000 UNIT) PO TABS
1000.0000 [IU] | ORAL_TABLET | Freq: Every day | ORAL | Status: DC
  Administered 2019-02-26 – 2019-02-28 (×3): 1000 [IU] via ORAL
  Filled 2019-02-26 (×3): qty 1

## 2019-02-26 MED ORDER — SODIUM CHLORIDE 0.9% FLUSH
5.0000 mL | Freq: Three times a day (TID) | INTRAVENOUS | Status: DC
  Administered 2019-02-26 – 2019-02-27 (×4): 5 mL
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
## 2019-02-26 NOTE — Progress Notes (Signed)
ANTICOAGULATION CONSULT NOTE   Pharmacy Consult for Post-IR Procedure Review of Anticoagulants/Antiplatelet  No Known Allergies  Patient Measurements: Height: 5\' 7"  (170.2 cm) Weight: 250 lb (113.4 kg) IBW/kg (Calculated) : 61.6  Vital Signs: Temp: 97.5 F (36.4 C) (01/03 0415) Temp Source: Oral (01/03 0415) BP: 113/67 (01/03 0415) Pulse Rate: 99 (01/03 0415)  Labs: Recent Labs    02/24/19 1328 02/24/19 1740 02/24/19 2322 02/25/19 0018  HGB 9.4*  --   --  8.5*  HCT 31.8*  --   --  27.8*  PLT 663*  --  561* 524*  APTT  --  59* 51*  --   LABPROT  --  26.3* 26.7*  --   INR  --  2.4* 2.5*  --   CREATININE 0.64  --   --  0.63    Estimated Creatinine Clearance: 119 mL/min (by C-G formula based on SCr of 0.63 mg/dL).   Medical History: Past Medical History:  Diagnosis Date  . Anemia   . Anxiety   . Arthritis   . Asthma   . Bipolar affective ([place])   . Blood transfusion without reported diagnosis   . Colostomy in place [facility])   . CVA (cerebrovascular accident) ([place])    left cerebellar infarct found accidentally on CT (2020)  . Depression   . Diverticulitis 2008   perforated/ requiring resection  . Family history of adverse reaction to anesthesia    MGM was placed on vent after surgery  . GERD (gastroesophageal reflux disease)   . Headache   . Shortness of breath dyspnea   . Sleep apnea    mild, does not use c-pap machine  . Substance abuse ([place])    12 years ago-crack cocaine    Medications:  Scheduled:  . amphetamine-dextroamphetamine  15 mg Oral BID AC  . benztropine  2 mg Oral QHS  . feeding supplement  1 Container Oral TID BM  . gabapentin  300 mg Oral TID  . haloperidol  15 mg Oral QHS  . LORazepam  1 mg Oral Daily  . modafinil  200 mg Oral Daily  . multivitamin with minerals  1 tablet Oral Daily  . QUEtiapine  600 mg Oral QHS  . sodium chloride flush  5 mL Intracatheter Q8H  . topiramate  100 mg Oral QHS    Assessment: Pt was not on  anticoagulation or antiplatelet PTA. Pt was started on subcutaneous heparin 5000 units TID for VTE prophylaxis on admission but was discontinued due to elevated INR. Pt underwent perc drain of abdominal wall seroma on 1/2. Procedure was standard bleeding risk.   Plan:  Continue holding pharmacologic VTE prophylaxis while INR elevated. Will recheck INR tomorrow and re-evaluate.  Berenice Bouton, PharmD PGY1 Pharmacy Resident  Please check AMION for all [place] phone numbers After 10:00 PM, call [place] [phone] 02/26/2019,7:48 AM

## 2019-02-26 NOTE — Progress Notes (Signed)
Referring Physician(s): Romana Juniper A (CCS)  Supervising Physician: Sandi Mariscal  Patient Status:  [facility] - In-pt  Chief Complaint: None  Subjective:  History of gastric sleeve surgery in 2015 with subsequent recurrent anterior abdominal wall seroma s/p anterior abdominal wall drain placement in IR 02/25/2019 by Dr. Pascal Lux. Patient awake and alert laying in bed talking on phone with no complaints at this time. Anterior abdominal wall drain site c/d/i.   Allergies: Patient has no known allergies.  Medications: Prior to Admission medications   Medication Sig Start Date End Date Taking? Authorizing Provider  albuterol (VENTOLIN HFA) 108 (90 Base) MCG/ACT inhaler Inhale 2 puffs into the lungs every 4 (four) hours as needed for wheezing or shortness of breath. 02/01/19  Yes Bigelow, Modena Nunnery, MD  aspirin EC 325 MG tablet Take 1 tablet (325 mg total) by mouth daily. 09/08/18  Yes Idol, Almyra Free, PA-C  atorvastatin (LIPITOR) 40 MG tablet TAKE 1 TABLET BY MOUTH EVERY DAY 02/13/19  Yes Susy Frizzle, MD  benztropine (COGENTIN) 2 MG tablet Take 2 mg by mouth at bedtime.   Yes [provider]  clonazePAM (KLONOPIN) 1 MG tablet Take 1 mg by mouth 2 (two) times daily.    Yes [provider]  gabapentin (NEURONTIN) 300 MG capsule Take 300 mg by mouth 3 (three) times daily. 12/23/18  Yes [provider]  haloperidol (HALDOL) 5 MG tablet Take 15 mg by mouth at bedtime.  11/21/14  Yes [provider]  LORazepam (ATIVAN) 1 MG tablet Take 1 mg by mouth daily.   Yes [provider]  NUVIGIL 250 MG tablet Take 250 mg by mouth daily. 11/14/14  Yes [provider]  ondansetron (ZOFRAN ODT) 4 MG disintegrating tablet Take 1 tablet (4 mg total) by mouth every 8 (eight) hours as needed for nausea or vomiting. 02/06/19  Yes Susy Frizzle, MD  QUEtiapine (SEROQUEL) 300 MG tablet Take 600 mg by mouth at bedtime.  11/21/14  Yes [provider]    topiramate (TOPAMAX) 100 MG tablet Take 100 mg by mouth at bedtime.  12/31/18  Yes [provider]  valACYclovir (VALTREX) 500 MG tablet Take 1 tablet (500 mg total) by mouth daily as needed (fever blister). 01/09/19  Yes Susy Frizzle, MD  amphetamine-dextroamphetamine (ADDERALL) 15 MG tablet Take 15 mg by mouth 2 (two) times daily.    [provider]  azithromycin (ZITHROMAX) 250 MG tablet 2 tabs poqday1, 1 tab poqday 2-5 Patient not taking: Reported on 02/24/2019 02/09/19   Susy Frizzle, MD  benzonatate (TESSALON) 200 MG capsule Take 1 capsule (200 mg total) by mouth 3 (three) times daily as needed for cough. Patient not taking: Reported on 02/24/2019 02/06/19   Susy Frizzle, MD  HYDROcodone-homatropine [facility]) 5-1.5 MG/5ML syrup Take 5 mLs by mouth every 8 (eight) hours as needed for cough. Patient not taking: Reported on 02/24/2019 02/09/19   Susy Frizzle, MD     Vital Signs: BP 127/76 (BP Location: Left Arm)   Pulse (!) 109   Temp 98.2 F (36.8 C) (Oral)   Resp 20   Ht 5\' 7"  (1.702 m)   Wt 250 lb (113.4 kg)   LMP 02/14/2019 (LMP Unknown)   SpO2 92%   BMI 39.16 kg/m   Physical Exam Vitals and nursing note reviewed.  Constitutional:      General: She is not in acute distress.    Appearance: Normal appearance.  Pulmonary:  Effort: Pulmonary effort is normal. No respiratory distress.  Abdominal:     Comments: Anterior abdominal wall drain site without tenderness, erythema, drainage, or active bleeding; approximately 25 cc dark red fluid in suction bulb.  Skin:    General: Skin is warm and dry.  Neurological:     Mental Status: She is alert and oriented to person, place, and time.  Psychiatric:        Mood and Affect: Mood normal.        Behavior: Behavior normal.     Imaging: CT ABDOMEN PELVIS W CONTRAST  Result Date: 02/24/2019 CLINICAL DATA:  Fever nausea and vomiting EXAM: CT ABDOMEN AND PELVIS WITH CONTRAST TECHNIQUE:  Multidetector CT imaging of the abdomen and pelvis was performed using the standard protocol following bolus administration of intravenous contrast. CONTRAST:  164mL OMNIPAQUE IOHEXOL 300 MG/ML  SOLN COMPARISON:  CT 02/19/2019, 04/22/2015, 06/05/2014 FINDINGS: Lower chest: Lung bases demonstrate multiple pulmonary nodules, stable since February 19, 2019, more prominent as compared with 2017. Largest nodules are visualized within the right lung base and measure up to 5 mm. The heart is nonenlarged. Enlarged right hilar nodes measuring up to 13 mm. Enlarged distal esophageal nodes measuring up to 16 mm. Infrahilar node on the right is present on 2016 study, distal esophageal nodes were present but may be slightly larger on today's study. Hepatobiliary: Calcified gallstones. No biliary dilatation. Nonspecific linear hypodensity within the left hepatic lobe Pancreas: Unremarkable. No pancreatic ductal dilatation or surrounding inflammatory changes. Spleen: Normal in size without focal abnormality. Adrenals/Urinary Tract: Adrenal glands are normal. Small subcentimeter hypodense lesions within both kidneys too small to further characterize but probably cysts. No hydronephrosis. The bladder is normal. Stomach/Bowel: Postsurgical changes of the stomach. No dilated small bowel. Diverticular disease of the colon without acute inflammatory change. Negative appendix. Vascular/Lymphatic: Nonaneurysmal aorta. Subcentimeter retroperitoneal nodes. Reproductive: Uterus and bilateral adnexa are unremarkable. Other: Negative for free air or free fluid. Large fluid collection within the subcutaneous fat of the midline anterior abdominal wall, this measures 22 by 12.1 by 14.5 cm. Collection demonstrates slightly thick wall posteriorly. Musculoskeletal: Degenerative changes of the lumbar spine. No acute or suspicious osseous abnormality. IMPRESSION: 1. Large fluid collection within the subcutaneous fat of the midline anterior abdominal  wall measuring 22 x 12.1 x 14.5 cm, corresponding to the history of recurrent abdominal wall seroma. 2. Gallstones 3. Colon diverticular disease without acute inflammatory change 4. Multiple pulmonary nodules at the lung bases. See previous recommendation from 02/19/2019 CT. Incompletely visualized hilar and lower mediastinal adenopathy, stable to slight increase since 2016. Nonemergent dedicated chest CT could be obtained to more thoroughly evaluate the adenopathy and slight increased pulmonary nodules at the bases. Electronically Signed   By: Donavan Foil M.D.   On: 02/24/2019 19:37   DG Chest Port 1 View  Result Date: 02/24/2019 CLINICAL DATA:  Pt to ER for evaluation of nausea, vomiting, and inability to tolerate PO. Reports has been feeling this way for approximately 1 month. EXAM: PORTABLE CHEST 1 VIEW COMPARISON:  None. FINDINGS: Stable cardiomediastinal contours. Low lung volumes. Bronchovascular crowding. No new focal infiltrate. No pneumothorax or large pleural effusion. No acute finding in the visualized skeleton. IMPRESSION: Low volume study with bronchovascular crowding. No definite new infiltrate. Electronically Signed   By: Audie Pinto M.D.   On: 02/24/2019 17:20   CT IMAGE GUIDED DRAINAGE BY PERCUTANEOUS CATHETER  Result Date: 02/25/2019 INDICATION: History of gastric sleeve surgery complicated by development of a recurrent abdominal  wall seroma which initially underwent ultrasound-guided drainage catheter placement on 05/04/2014 which was subsequently removed only to recur requiring CT-guided drainage catheter placement 07/27/2014. Patient ultimately went operative debridement however unfortunately, the patient returns to the emergency department with fever and recurrent abdominal pain with CT scan demonstrating a recurrent large abdominal wall seroma worrisome for infection of recurrent body wall seroma. EXAM: CT IMAGE GUIDED DRAINAGE BY PERCUTANEOUS CATHETER COMPARISON:  CT abdomen  pelvis-02/24/2019; 04/12/2015; 06/05/2014; 05/03/2014; ultrasound-guided abdominal wall abscess drainage catheter placement-05/04/2014; CT-guided percutaneous drainage catheter placement-07/27/2014 MEDICATIONS: The patient is currently admitted to the hospital and receiving intravenous antibiotics. The antibiotics were administered within an appropriate time frame prior to the initiation of the procedure. ANESTHESIA/SEDATION: Moderate (conscious) sedation was employed during this procedure. A total of Versed 1 mg and Fentanyl 50 mcg was administered intravenously. Moderate Sedation Time: 23 minutes. The patient's level of consciousness and vital signs were monitored continuously by radiology nursing throughout the procedure under my direct supervision. CONTRAST:  None COMPLICATIONS: None immediate. PROCEDURE: Informed written consent was obtained from the patient after a discussion of the risks, benefits and alternatives to treatment. The patient was placed supine on the CT gantry and a pre procedural CT was performed re-demonstrating the known abscess/fluid collection within the anterior abdominal wall with dominant component measuring at least 22.7 x 12.4 cm (image 33, series 2). The procedure was planned. A timeout was performed prior to the initiation of the procedure. The skin overlying the ventral aspect of the right mid hemiabdomen was prepped and draped in the usual sterile fashion. The overlying soft tissues were anesthetized with 1% lidocaine with epinephrine. An 18 gauge trocar needle was advanced into the abscess/fluid collection and a short Amplatz super stiff wire was coiled within the collection. The tract was serially dilated allowing placement of a 12 [place] all-purpose drainage catheter. Appropriate positioning was confirmed with a limited postprocedural CT scan. Next, approximately 2.4 L of pink colored thick purulent appearing fluid was aspirated. The tube was connected to a JP bulb and sutured in  place. A dressing was placed. The patient tolerated the procedure well without immediate post procedural complication. IMPRESSION: Successful CT guided placement of a 78 [place] all purpose drain catheter into the recurrent presumed superinfected abdominal wall seroma with aspiration of 2.4 L of tank color, thick, purulent appearing fluid. A representative aspirated sample was capped and sent to laboratory for analysis. Electronically Signed   By: Sandi Mariscal M.D.   On: 02/25/2019 12:55    Labs:  CBC: Recent Labs    01/09/19 1032 02/19/19 1316 02/24/19 1328 02/24/19 2322 02/25/19 0018  WBC 5.9 8.5 8.7  --  8.4  HGB 11.7 9.1* 9.4*  --  8.5*  HCT 36.9 30.6* 31.8*  --  27.8*  PLT 389 511* 663* 561* 524*    COAGS: Recent Labs    02/24/19 1740 02/24/19 2322  INR 2.4* 2.5*  APTT 59* 51*    BMP: Recent Labs    01/09/19 1032 02/19/19 1316 02/24/19 1328 02/25/19 0018  NA 138 134* 134* 134*  K 4.0 2.7* 2.7* 3.6  CL 103 95* 95* 102  CO2 24 25 23  19*  GLUCOSE 73 108* 131* 91  BUN 8 6 6  5*  CALCIUM 9.2 8.4* 8.4* 8.1*  CREATININE 0.70 0.59 0.64 0.63  GFRNONAA 107 >60 >60 >60  GFRAA 124 >60 >60 >60    LIVER FUNCTION TESTS: Recent Labs    01/09/19 1032 02/19/19 1316 02/24/19 1328 02/25/19 0018  BILITOT 0.3 0.7 0.3 0.4  AST 10 51* 44* 39  ALT 9 48* 33 29  ALKPHOS  --  124 113 106  PROT 6.9 7.4 7.1 6.7  ALBUMIN  --  2.8* 2.3* 2.2*    Assessment and Plan:  History of gastric sleeve surgery in 2015 with subsequent recurrent anterior abdominal wall seroma s/p anterior abdominal wall drain placement in IR 02/25/2019 by Dr. Pascal Lux. Anterior abdominal wall drain stable with approximately 25 cc dark red fluid in suction bulb (additional 30 cc output from drain in past 24 hours per chart). Continue current drain management- continue with Qshift flushes/monitor of output. Plan for repeat CT/possible drain injection when output <10 cc/day (assess for possible removal). Further  plans per [facility]S- appreciate and agree with management. IR to follow.   Electronically Signed: Earley Abide, PA-C 02/26/2019, 11:39 AM   I spent a total of 25 Minutes at the the patient's bedside AND on the patient's hospital floor or unit, greater than 50% of which was counseling/coordinating care for anterior abdominal wall seroma s/p drain placement.

## 2019-02-26 NOTE — Progress Notes (Addendum)
Patient ID: COVA KNIERIEM, female   DOB: 08-26-1975, 44 y.o.   MRN: [id number]   Acute Care Surgery Service Progress Note:    Chief Complaint/Subjective: States she feels a bit better and tolerated some of her breakfast.  Appeared to have a bit of a SIRS response after drain placement yesterday  Objective: Vital signs in last 24 hours: Temp:  [97.5 F (36.4 C)-99.3 F (37.4 C)] 98.2 F (36.8 C) (01/03 0819) Pulse Rate:  [96-128] 109 (01/03 0819) Resp:  [19-34] 20 (01/03 0819) BP: (113-153)/(67-98) 127/76 (01/03 0819) SpO2:  [92 %-100 %] 92 % (01/03 0819) Last BM Date: 02/18/19  Intake/Output from previous day: 01/02 0701 - 01/03 0700 In: 160  Out: 185 [Drains:185] Intake/Output this shift: Total I/O In: 5 [Other:5] Out: 30 [Drains:30]  Lungs:  nonlabored  Cardiovascular: reg  Abd: soft, old incisions, drain in place, no cellulitis.   Extremities: no edema, +SCDs  Neuro: alert, nonfocal  Lab Results: CBC  Recent Labs    02/24/19 1328 02/24/19 2322 02/25/19 0018  WBC 8.7  --  8.4  HGB 9.4*  --  8.5*  HCT 31.8*  --  27.8*  PLT 663* 561* 524*   BMET Recent Labs    02/24/19 1328 02/25/19 0018  NA 134* 134*  K 2.7* 3.6  CL 95* 102  CO2 23 19*  GLUCOSE 131* 91  BUN 6 5*  CREATININE 0.64 0.63  CALCIUM 8.4* 8.1*   LFT Hepatic Function Latest Ref Rng & Units 02/25/2019 02/24/2019 02/19/2019  Total Protein 6.5 - 8.1 g/dL 6.7 7.1 7.4  Albumin 3.5 - 5.0 g/dL 2.2(L) 2.3(L) 2.8(L)  AST 15 - 41 U/L 39 44(H) 51(H)  ALT 0 - 44 U/L 29 33 48(H)  Alk Phosphatase 38 - 126 U/L 106 113 124  Total Bilirubin 0.3 - 1.2 mg/dL 0.4 0.3 0.7   PT/INR Recent Labs    02/24/19 1740 02/24/19 2322  LABPROT 26.3* 26.7*  INR 2.4* 2.5*   ABG No results for input(s): PHART, HCO3 in the last 72 hours.  Invalid input(s): PCO2, PO2  Studies/Results:  Anti-infectives: Anti-infectives (From admission, onward)   Start     Dose/Rate Route Frequency Ordered Stop   02/25/19 0600   vancomycin (VANCOREADY) IVPB 1250 mg/250 mL     1,250 mg 166.7 mL/hr over 90 Minutes Intravenous Every 12 hours 02/24/19 1657     02/25/19 0200  ceFEPIme (MAXIPIME) 2 g in sodium chloride 0.9 % 100 mL IVPB     2 g 200 mL/hr over 30 Minutes Intravenous Every 8 hours 02/24/19 1657     02/24/19 1700  metroNIDAZOLE (FLAGYL) IVPB 500 mg     500 mg 100 mL/hr over 60 Minutes Intravenous  Once 02/24/19 1650 02/24/19 2035   02/24/19 1700  ceFEPIme (MAXIPIME) 2 g in sodium chloride 0.9 % 100 mL IVPB     2 g 200 mL/hr over 30 Minutes Intravenous  Once 02/24/19 1652 02/24/19 1804   02/24/19 1700  vancomycin (VANCOREADY) IVPB 2000 mg/400 mL     2,000 mg 200 mL/hr over 120 Minutes Intravenous  Once 02/24/19 1652 02/24/19 2247      Medications: Scheduled Meds: . amphetamine-dextroamphetamine  15 mg Oral BID AC  . benztropine  2 mg Oral QHS  . feeding supplement  1 Container Oral TID BM  . gabapentin  300 mg Oral TID  . haloperidol  15 mg Oral QHS  . LORazepam  1 mg Oral Daily  . modafinil  200  mg Oral Daily  . multivitamin with minerals  1 tablet Oral Daily  . QUEtiapine  600 mg Oral QHS  . sodium chloride flush  5 mL Intracatheter Q8H  . topiramate  100 mg Oral QHS   Continuous Infusions: . sodium chloride 50 mL/hr at 02/25/19 1350  . ceFEPime (MAXIPIME) IV 2 g (02/26/19 0755)  . vancomycin 1,250 mg (02/26/19 0544)   PRN Meds:.acetaminophen, albuterol, morphine injection, ondansetron **OR** ondansetron (ZOFRAN) IV  Assessment/Plan: Patient Active Problem List   Diagnosis Date Noted  . Hypokalemia 02/24/2019  . Sepsis ([place]) 02/24/2019  . CVA (cerebrovascular accident) ([place])   . Degeneration of lumbar or lumbosacral intervertebral disc 12/13/2014  . Anemia associated with nutritional deficiency 11/12/2014  . Bipolar affective disorder ([place]) 09/28/2014  . Anxiety state 09/28/2014  . S/P laparoscopic sleeve gastrectomy 01/2014 09/28/2014  . Abdominal wall seroma - chronic   . Seroma    . Cholelithiases 04/25/2013  . Cough 09/19/2011  . Smoker 09/19/2011  . GERD 03/04/2010  . RECTAL BLEEDING 03/04/2010  . DYSPHAGIA UNSPECIFIED 03/04/2010  . DYSPHAGIA 03/04/2010  . RUQ PAIN 03/04/2010   Recurrent chronic abdominal wall seroma Bipolar d/o H/o lap sleeve gastrectomy 01/2014 H/o perf diverticulitis s/p hartmann (~2004) and later reversal H/o ventral incisional hernia repair at [employer] many years complicated by chronic seroma SIRs >1 mo nausea/intermittent vomiting Anemia - iron deficient  Protein calorie malnutrition  - alb 2.8, prealbumin 6.5 Elevated INR  S/p drain placement.  Gram stain shows infected seroma  Recommend nutrition consult on Monday - her compliance with a bariatric meal plan unfortunately has always been a challenge.   Added Ensure supplements for malnutrition   Disposition:  LOS: 2 days    Rosario Adie, MD  [facility] [place] [facility]

## 2019-02-27 LAB — CBC
HCT: 25.5 % — ABNORMAL LOW (ref 36.0–46.0)
Hemoglobin: 7.5 g/dL — ABNORMAL LOW (ref 12.0–15.0)
MCH: 24.2 pg — ABNORMAL LOW (ref 26.0–34.0)
MCHC: 29.4 g/dL — ABNORMAL LOW (ref 30.0–36.0)
MCV: 82.3 fL (ref 80.0–100.0)
Platelets: 535 10*3/uL — ABNORMAL HIGH (ref 150–400)
RBC: 3.1 MIL/uL — ABNORMAL LOW (ref 3.87–5.11)
RDW: 15.1 % (ref 11.5–15.5)
WBC: 4.9 10*3/uL (ref 4.0–10.5)
nRBC: 1 % — ABNORMAL HIGH (ref 0.0–0.2)

## 2019-02-27 LAB — CULTURE, BLOOD (ROUTINE X 2): Special Requests: ADEQUATE

## 2019-02-27 LAB — COMPREHENSIVE METABOLIC PANEL
ALT: 24 U/L (ref 0–44)
AST: 35 U/L (ref 15–41)
Albumin: 1.7 g/dL — ABNORMAL LOW (ref 3.5–5.0)
Alkaline Phosphatase: 79 U/L (ref 38–126)
Anion gap: 12 (ref 5–15)
BUN: <5 mg/dL — ABNORMAL LOW (ref 6–20)
CO2: 23 mmol/L (ref 22–32)
Calcium: 8.2 mg/dL — ABNORMAL LOW (ref 8.9–10.3)
Chloride: 102 mmol/L (ref 98–111)
Creatinine, Ser: 0.48 mg/dL (ref 0.44–1.00)
GFR calc Af Amer: >60 mL/min (ref >60)
GFR calc non Af Amer: >60 mL/min (ref >60)
Glucose, Bld: 107 mg/dL — ABNORMAL HIGH (ref 70–99)
Potassium: 2.8 mmol/L — ABNORMAL LOW (ref 3.5–5.1)
Sodium: 137 mmol/L (ref 135–145)
Total Bilirubin: 0.3 mg/dL (ref 0.3–1.2)
Total Protein: 5.6 g/dL — ABNORMAL LOW (ref 6.5–8.1)

## 2019-02-27 LAB — PROTIME-INR
INR: 1.4 — ABNORMAL HIGH (ref 0.8–1.2)
Prothrombin Time: 17.4 seconds — ABNORMAL HIGH (ref 11.4–15.2)

## 2019-02-27 LAB — VANCOMYCIN, TROUGH: Vancomycin Tr: 8 ug/mL — ABNORMAL LOW (ref 15–20)

## 2019-02-27 MED ORDER — CEPHALEXIN 500 MG PO CAPS
500.0000 mg | ORAL_CAPSULE | Freq: Four times a day (QID) | ORAL | Status: DC
  Administered 2019-02-27 – 2019-02-28 (×3): 500 mg via ORAL
  Filled 2019-02-27 (×3): qty 1

## 2019-02-27 MED ORDER — POTASSIUM CHLORIDE CRYS ER 20 MEQ PO TBCR
40.0000 meq | EXTENDED_RELEASE_TABLET | Freq: Three times a day (TID) | ORAL | Status: DC
  Administered 2019-02-27 (×2): 40 meq via ORAL
  Filled 2019-02-27 (×3): qty 2

## 2019-02-27 NOTE — Progress Notes (Signed)
Expressed clot from drain tubing- 20cc output prior too- immediate 70cc- post
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
## 2019-02-27 NOTE — Progress Notes (Addendum)
Referring Physician(s): Dr Deland Pretty  Supervising Physician: Sandi Mariscal  Patient Status:  [name] - In-pt  Chief Complaint:  Recurrent chronic abdominal wall seroma  Subjective:  Bipolar d/o H/o lap sleeve gastrectomy 01/2014 H/o perf diverticulitis s/p hartmann (~2004) and later reversal H/o ventral incisional hernia repair at [facility] many years complicated by chronic seroma >1 mo nausea/intermittent vomiting Anemia - iron deficient  Protein calorie malnutrition  - alb 2.8, prealbumin 6.5  IR placed seroma drain 02/25/19 Pt feeling better after drain placement OP is copious    Allergies: Patient has no known allergies.  Medications: Prior to Admission medications   Medication Sig Start Date End Date Taking? Authorizing Provider  albuterol (VENTOLIN HFA) 108 (90 Base) MCG/ACT inhaler Inhale 2 puffs into the lungs every 4 (four) hours as needed for wheezing or shortness of breath. 02/01/19  Yes [place], Modena Nunnery, MD  aspirin EC 325 MG tablet Take 1 tablet (325 mg total) by mouth daily. 09/08/18  Yes Idol, Almyra Free, PA-C  atorvastatin (LIPITOR) 40 MG tablet TAKE 1 TABLET BY MOUTH EVERY DAY 02/13/19  Yes Susy Frizzle, MD  benztropine (COGENTIN) 2 MG tablet Take 2 mg by mouth at bedtime.   Yes [provider]  clonazePAM (KLONOPIN) 1 MG tablet Take 1 mg by mouth 2 (two) times daily.    Yes [provider]  gabapentin (NEURONTIN) 300 MG capsule Take 300 mg by mouth 3 (three) times daily. 12/23/18  Yes [provider]  haloperidol (HALDOL) 5 MG tablet Take 15 mg by mouth at bedtime.  11/21/14  Yes [provider]  LORazepam (ATIVAN) 1 MG tablet Take 1 mg by mouth daily.   Yes [provider]  NUVIGIL 250 MG tablet Take 250 mg by mouth daily. 11/14/14  Yes [provider]  ondansetron (ZOFRAN ODT) 4 MG disintegrating tablet Take 1 tablet (4 mg total) by mouth every 8 (eight) hours as needed for nausea or vomiting. 02/06/19   Yes Susy Frizzle, MD  QUEtiapine (SEROQUEL) 300 MG tablet Take 600 mg by mouth at bedtime.  11/21/14  Yes [provider]  topiramate (TOPAMAX) 100 MG tablet Take 100 mg by mouth at bedtime.  12/31/18  Yes [provider]  valACYclovir (VALTREX) 500 MG tablet Take 1 tablet (500 mg total) by mouth daily as needed (fever blister). 01/09/19  Yes Susy Frizzle, MD  amphetamine-dextroamphetamine (ADDERALL) 15 MG tablet Take 15 mg by mouth 2 (two) times daily.    [provider]  azithromycin (ZITHROMAX) 250 MG tablet 2 tabs poqday1, 1 tab poqday 2-5 Patient not taking: Reported on 02/24/2019 02/09/19   Susy Frizzle, MD  benzonatate (TESSALON) 200 MG capsule Take 1 capsule (200 mg total) by mouth 3 (three) times daily as needed for cough. Patient not taking: Reported on 02/24/2019 02/06/19   Susy Frizzle, MD  HYDROcodone-homatropine [facility]) 5-1.5 MG/5ML syrup Take 5 mLs by mouth every 8 (eight) hours as needed for cough. Patient not taking: Reported on 02/24/2019 02/09/19   Susy Frizzle, MD     Vital Signs: BP 127/82 (BP Location: Right Arm)   Pulse 99   Temp (!) 97.3 F (36.3 C) (Oral)   Resp 15   Ht 5\' 7"  (1.702 m)   Wt 250 lb (113.4 kg)   LMP 02/14/2019 (LMP Unknown)   SpO2 94%   BMI 39.16 kg/m   Physical Exam Skin:    General: Skin is warm and dry.  Comments: Site is clean and dry NT no bleeding 200 cc OP from drain 20 cc in JP- serous color  Culture ABUNDANT STAPHYLOCOCCUS AUREUS  Report Status PENDING  Organism ID, Bacteria STAPHYLOCOCCUS AUREUS        Imaging: CT ABDOMEN PELVIS W CONTRAST  Result Date: 02/24/2019 CLINICAL DATA:  Fever nausea and vomiting EXAM: CT ABDOMEN AND PELVIS WITH CONTRAST TECHNIQUE: Multidetector CT imaging of the abdomen and pelvis was performed using the standard protocol following bolus administration of intravenous contrast. CONTRAST:  123mL OMNIPAQUE IOHEXOL 300 MG/ML  SOLN COMPARISON:  CT  02/19/2019, 04/22/2015, 06/05/2014 FINDINGS: Lower chest: Lung bases demonstrate multiple pulmonary nodules, stable since February 19, 2019, more prominent as compared with 2017. Largest nodules are visualized within the right lung base and measure up to 5 mm. The heart is nonenlarged. Enlarged right hilar nodes measuring up to 13 mm. Enlarged distal esophageal nodes measuring up to 16 mm. Infrahilar node on the right is present on 2016 study, distal esophageal nodes were present but may be slightly larger on today's study. Hepatobiliary: Calcified gallstones. No biliary dilatation. Nonspecific linear hypodensity within the left hepatic lobe Pancreas: Unremarkable. No pancreatic ductal dilatation or surrounding inflammatory changes. Spleen: Normal in size without focal abnormality. Adrenals/Urinary Tract: Adrenal glands are normal. Small subcentimeter hypodense lesions within both kidneys too small to further characterize but probably cysts. No hydronephrosis. The bladder is normal. Stomach/Bowel: Postsurgical changes of the stomach. No dilated small bowel. Diverticular disease of the colon without acute inflammatory change. Negative appendix. Vascular/Lymphatic: Nonaneurysmal aorta. Subcentimeter retroperitoneal nodes. Reproductive: Uterus and bilateral adnexa are unremarkable. Other: Negative for free air or free fluid. Large fluid collection within the subcutaneous fat of the midline anterior abdominal wall, this measures 22 by 12.1 by 14.5 cm. Collection demonstrates slightly thick wall posteriorly. Musculoskeletal: Degenerative changes of the lumbar spine. No acute or suspicious osseous abnormality. IMPRESSION: 1. Large fluid collection within the subcutaneous fat of the midline anterior abdominal wall measuring 22 x 12.1 x 14.5 cm, corresponding to the history of recurrent abdominal wall seroma. 2. Gallstones 3. Colon diverticular disease without acute inflammatory change 4. Multiple pulmonary nodules at the  lung bases. See previous recommendation from 02/19/2019 CT. Incompletely visualized hilar and lower mediastinal adenopathy, stable to slight increase since 2016. Nonemergent dedicated chest CT could be obtained to more thoroughly evaluate the adenopathy and slight increased pulmonary nodules at the bases. Electronically Signed   By: Donavan Foil M.D.   On: 02/24/2019 19:37   DG Chest Port 1 View  Result Date: 02/24/2019 CLINICAL DATA:  Pt to ER for evaluation of nausea, vomiting, and inability to tolerate PO. Reports has been feeling this way for approximately 1 month. EXAM: PORTABLE CHEST 1 VIEW COMPARISON:  None. FINDINGS: Stable cardiomediastinal contours. Low lung volumes. Bronchovascular crowding. No new focal infiltrate. No pneumothorax or large pleural effusion. No acute finding in the visualized skeleton. IMPRESSION: Low volume study with bronchovascular crowding. No definite new infiltrate. Electronically Signed   By: Audie Pinto M.D.   On: 02/24/2019 17:20   CT IMAGE GUIDED DRAINAGE BY PERCUTANEOUS CATHETER  Result Date: 02/25/2019 INDICATION: History of gastric sleeve surgery complicated by development of a recurrent abdominal wall seroma which initially underwent ultrasound-guided drainage catheter placement on 05/04/2014 which was subsequently removed only to recur requiring CT-guided drainage catheter placement 07/27/2014. Patient ultimately went operative debridement however unfortunately, the patient returns to the emergency department with fever and recurrent abdominal pain with CT scan demonstrating a recurrent large  abdominal wall seroma worrisome for infection of recurrent body wall seroma. EXAM: CT IMAGE GUIDED DRAINAGE BY PERCUTANEOUS CATHETER COMPARISON:  CT abdomen pelvis-02/24/2019; 04/12/2015; 06/05/2014; 05/03/2014; ultrasound-guided abdominal wall abscess drainage catheter placement-05/04/2014; CT-guided percutaneous drainage catheter placement-07/27/2014 MEDICATIONS: The  patient is currently admitted to the hospital and receiving intravenous antibiotics. The antibiotics were administered within an appropriate time frame prior to the initiation of the procedure. ANESTHESIA/SEDATION: Moderate (conscious) sedation was employed during this procedure. A total of Versed 1 mg and Fentanyl 50 mcg was administered intravenously. Moderate Sedation Time: 23 minutes. The patient's level of consciousness and vital signs were monitored continuously by radiology nursing throughout the procedure under my direct supervision. CONTRAST:  None COMPLICATIONS: None immediate. PROCEDURE: Informed written consent was obtained from the patient after a discussion of the risks, benefits and alternatives to treatment. The patient was placed supine on the CT gantry and a pre procedural CT was performed re-demonstrating the known abscess/fluid collection within the anterior abdominal wall with dominant component measuring at least 22.7 x 12.4 cm (image 33, series 2). The procedure was planned. A timeout was performed prior to the initiation of the procedure. The skin overlying the ventral aspect of the right mid hemiabdomen was prepped and draped in the usual sterile fashion. The overlying soft tissues were anesthetized with 1% lidocaine with epinephrine. An 18 gauge trocar needle was advanced into the abscess/fluid collection and a short Amplatz super stiff wire was coiled within the collection. The tract was serially dilated allowing placement of a 12 [place] all-purpose drainage catheter. Appropriate positioning was confirmed with a limited postprocedural CT scan. Next, approximately 2.4 L of pink colored thick purulent appearing fluid was aspirated. The tube was connected to a JP bulb and sutured in place. A dressing was placed. The patient tolerated the procedure well without immediate post procedural complication. IMPRESSION: Successful CT guided placement of a 55 French all purpose drain catheter into the  recurrent presumed superinfected abdominal wall seroma with aspiration of 2.4 L of tank color, thick, purulent appearing fluid. A representative aspirated sample was capped and sent to laboratory for analysis. Electronically Signed   By: Sandi Mariscal M.D.   On: 02/25/2019 12:55    Labs:  CBC: Recent Labs    02/19/19 1316 02/24/19 1328 02/24/19 2322 02/25/19 0018 02/27/19 0635  WBC 8.5 8.7  --  8.4 4.9  HGB 9.1* 9.4*  --  8.5* 7.5*  HCT 30.6* 31.8*  --  27.8* 25.5*  PLT 511* 663* 561* 524* 535*    COAGS: Recent Labs    02/24/19 1740 02/24/19 2322 02/27/19 0635  INR 2.4* 2.5* 1.4*  APTT 59* 51*  --     BMP: Recent Labs    02/19/19 1316 02/24/19 1328 02/25/19 0018 02/27/19 0635  NA 134* 134* 134* 137  K 2.7* 2.7* 3.6 2.8*  CL 95* 95* 102 102  CO2 25 23 19* 23  GLUCOSE 108* 131* 91 107*  BUN 6 6 5* <5*  CALCIUM 8.4* 8.4* 8.1* 8.2*  CREATININE 0.59 0.64 0.63 0.48  GFRNONAA >60 >60 >60 >60  GFRAA >60 >60 >60 >60    LIVER FUNCTION TESTS: Recent Labs    02/19/19 1316 02/24/19 1328 02/25/19 0018 02/27/19 0635  BILITOT 0.7 0.3 0.4 0.3  AST 51* 44* 39 35  ALT 48* 33 29 24  ALKPHOS 124 113 106 79  PROT 7.4 7.1 6.7 5.6*  ALBUMIN 2.8* 2.3* 2.2* 1.7*    Assessment and Plan:  Probable home with  drain IR will call pt for follow up in OP Clinic Orders in chart Will need to flush once daily Will need Rx for flushes She will hear from IR  Electronically Signed: Lavonia Drafts, PA-C 02/27/2019, 2:11 PM   I spent a total of 15 Minutes at the the patient's bedside AND on the patient's hospital floor or unit, greater than 50% of which was counseling/coordinating care for abd seroma drain

## 2019-02-27 NOTE — Progress Notes (Signed)
Pharmacy note - antibiotic stewardship  Abscess culture growing MSSA.  Note from Dr. Dema Severin recommended changing to oral antibiotics.  I messaged Dr. Avon Gully and recommended cephalexin for her MSSA infection.  Orders received to change to cephalexin 500mg  QID.  Heide Guile, PharmD, BCPS-AQ ID Clinical Pharmacist [phone]

## 2019-02-27 NOTE — Progress Notes (Signed)
Patient ID: [name], female   DOB: [birth year], 44 y.o.   MRN: [id number]   Acute Care Surgery Service Progress Note:    Chief Complaint/Subjective: States she feels a better and tolerated diet.  Asking to go home. Reports overall feeling much better since drain placed.  Objective: Vital signs in last 24 hours: Temp:  [97.3 F (36.3 C)-98.7 F (37.1 C)] 97.3 F (36.3 C) (01/04 0845) Pulse Rate:  [92-101] 99 (01/04 0845) Resp:  [15-20] 15 (01/04 0845) BP: (115-127)/(64-82) 127/82 (01/04 0845) SpO2:  [94 %-96 %] 94 % (01/04 0845) Last BM Date: 02/18/19  Intake/Output from previous day: 01/03 0701 - 01/04 0700 In: 3123.1 [I.V.:1518.1; IV Piggyback:1600.1] Out: 550 [Urine:350; Drains:200] Intake/Output this shift: No intake/output data recorded.  Lungs:  nonlabored  Cardiovascular: reg  Abd: soft, old incisions, drain in place, no cellulitis.   Extremities: no edema, +SCDs  Neuro: alert, nonfocal  Lab Results: CBC  Recent Labs    02/25/19 0018 02/27/19 0635  WBC 8.4 4.9  HGB 8.5* 7.5*  HCT 27.8* 25.5*  PLT 524* 535*   BMET Recent Labs    02/25/19 0018 02/27/19 0635  NA 134* 137  K 3.6 2.8*  CL 102 102  CO2 19* 23  GLUCOSE 91 107*  BUN 5* <5*  CREATININE 0.63 0.48  CALCIUM 8.1* 8.2*   LFT Hepatic Function Latest Ref Rng & Units 02/27/2019 02/25/2019 02/24/2019  Total Protein 6.5 - 8.1 g/dL 5.6(L) 6.7 7.1  Albumin 3.5 - 5.0 g/dL 1.7(L) 2.2(L) 2.3(L)  AST 15 - 41 U/L 35 39 44(H)  ALT 0 - 44 U/L 24 29 33  Alk Phosphatase 38 - 126 U/L 79 106 113  Total Bilirubin 0.3 - 1.2 mg/dL 0.3 0.4 0.3   PT/INR Recent Labs    02/24/19 2322 02/27/19 0635  LABPROT 26.7* 17.4*  INR 2.5* 1.4*   ABG No results for input(s): PHART, HCO3 in the last 72 hours.  Invalid input(s): PCO2, PO2  Studies/Results:  Anti-infectives: Anti-infectives (From admission, onward)   Start     Dose/Rate Route Frequency Ordered Stop   02/25/19 0600  vancomycin (VANCOREADY) IVPB  1250 mg/250 mL     1,250 mg 166.7 mL/hr over 90 Minutes Intravenous Every 12 hours 02/24/19 1657     02/25/19 0200  ceFEPIme (MAXIPIME) 2 g in sodium chloride 0.9 % 100 mL IVPB     2 g 200 mL/hr over 30 Minutes Intravenous Every 8 hours 02/24/19 1657     02/24/19 1700  metroNIDAZOLE (FLAGYL) IVPB 500 mg     500 mg 100 mL/hr over 60 Minutes Intravenous  Once 02/24/19 1650 02/24/19 2035   02/24/19 1700  ceFEPIme (MAXIPIME) 2 g in sodium chloride 0.9 % 100 mL IVPB     2 g 200 mL/hr over 30 Minutes Intravenous  Once 02/24/19 1652 02/24/19 1804   02/24/19 1700  vancomycin (VANCOREADY) IVPB 2000 mg/400 mL     2,000 mg 200 mL/hr over 120 Minutes Intravenous  Once 02/24/19 1652 02/24/19 2247      Medications: Scheduled Meds: . amphetamine-dextroamphetamine  15 mg Oral BID AC  . benztropine  2 mg Oral QHS  . cholecalciferol  1,000 Units Oral Daily  . feeding supplement  1 Container Oral TID BM  . feeding supplement (ENSURE ENLIVE)  237 mL Oral BID BM  . gabapentin  300 mg Oral TID  . haloperidol  15 mg Oral QHS  . LORazepam  1 mg Oral Daily  . modafinil  200 mg Oral Daily  . multivitamins with iron  1 tablet Oral Daily  . QUEtiapine  600 mg Oral QHS  . sodium chloride flush  5 mL Intracatheter Q8H  . topiramate  100 mg Oral QHS   Continuous Infusions: . sodium chloride 50 mL/hr at 02/27/19 0400  . ceFEPime (MAXIPIME) IV 2 g (02/27/19 0921)  . vancomycin 1,250 mg (02/27/19 0653)   PRN Meds:.acetaminophen, albuterol, morphine injection, ondansetron **OR** ondansetron (ZOFRAN) IV  Assessment/Plan: Patient Active Problem List   Diagnosis Date Noted  . Hypokalemia 02/24/2019  . Sepsis ([place]) 02/24/2019  . CVA (cerebrovascular accident) ([place])   . Degeneration of lumbar or lumbosacral intervertebral disc 12/13/2014  . Anemia associated with nutritional deficiency 11/12/2014  . Bipolar affective disorder ([place]) 09/28/2014  . Anxiety state 09/28/2014  . S/P laparoscopic sleeve  gastrectomy 01/2014 09/28/2014  . Abdominal wall seroma - chronic   . Seroma   . Cholelithiases 04/25/2013  . Cough 09/19/2011  . Smoker 09/19/2011  . GERD 03/04/2010  . RECTAL BLEEDING 03/04/2010  . DYSPHAGIA UNSPECIFIED 03/04/2010  . DYSPHAGIA 03/04/2010  . RUQ PAIN 03/04/2010   Recurrent chronic abdominal wall seroma Bipolar d/o H/o lap sleeve gastrectomy 01/2014 H/o perf diverticulitis s/p hartmann (~2004) and later reversal H/o ventral incisional hernia repair at [facility] many years complicated by chronic seroma >1 mo nausea/intermittent vomiting Anemia - iron deficient  Protein calorie malnutrition  - alb 2.8, prealbumin 6.5 Elevated INR  S/p drain placement.  Gram stain shows infected seroma  Nutrition consult prior to discharge - her compliance with a bariatric meal plan unfortunately has always been a challenge.   Added Ensure supplements for malnutrition  Can transition to PO abx and should be clear for discharge pending nutrition clearance; anticipate now that infectious source has been addressed, this should be less of an issue - in the past she reports dietary improvement following addressing her seroma infection   Disposition:  LOS: 3 days   Nadeen Landau, M.D. [facility] Use [email or web address] to contact on call provider

## 2019-02-27 NOTE — Plan of Care (Signed)
  Problem: Activity: Goal: Risk for activity intolerance will decrease Outcome: Progressing   Problem: Clinical Measurements: Goal: Ability to maintain clinical measurements within normal limits will improve Outcome: Progressing   Problem: Nutrition: Goal: Adequate nutrition will be maintained Outcome: Progressing   Problem: Coping: Goal: Level of anxiety will decrease Outcome: Progressing   Problem: Elimination: Goal: Will not experience complications related to bowel motility Outcome: Progressing   Problem: Pain Managment: Goal: General experience of comfort will improve Outcome: Progressing   Problem: Safety: Goal: Ability to remain free from injury will improve Outcome: Progressing   Problem: Skin Integrity: Goal: Risk for impaired skin integrity will decrease Outcome: Progressing   

## 2019-02-27 NOTE — Progress Notes (Signed)
Pharmacy Antibiotic Note  [name] is a 44 y.o. female on day # 4 Vancomycin and Cefepime for infected seroma. Drain placed 1/2.  Abundant gram + cocci in abscess culture, reincubated. Coag negative Staph in one blood culture, possible contaminant.  Vanc levels done last pm/this morning - AUC 456, within target range of 400-550.  Noted improved and may change to oral antibiotics per surgery.  Plan: Continue Vancomycin 1250 mg IV q12hrs Continue Cefepime 2gm IV q8hrs. Follow renal function, final culture data, antibiotic plans.  Height: 5\' 7"  (170.2 cm) Weight: 250 lb (113.4 kg) IBW/kg (Calculated) : 61.6  Temp (24hrs), Avg:98.1 F (36.7 C), Min:97.3 F (36.3 C), Max:98.7 F (37.1 C)  Recent Labs  Lab 02/24/19 1328 02/24/19 1720 02/24/19 2322 02/25/19 0018 02/26/19 2109 02/27/19 0635  WBC 8.7  --   --  8.4  --  4.9  CREATININE 0.64  --   --  0.63  --  0.48  LATICACIDVEN  --  2.3* 1.3  --   --   --   VANCOTROUGH  --   --   --   --   --  8*  VANCOPEAK  --   --   --   --  36  --     Estimated Creatinine Clearance: 119 mL/min (by C-G formula based on SCr of 0.48 mg/dL).    No Known Allergies  Antimicrobials this admission: Vancomycin 1/1>> Cefepime 1/1>> Metronidazole x 1 on 1/1  Dose adjustments this admission: 1/3 VP 36, 1/4 VT 8, AUC 456 on 1250 mg IV q12h - no change  Microbiology results: 1/1 Blood: 2/4 GPC>CoNS, 2/4 no growth x 2 days to date 1/1 Urine: multiple species 1/2 Abscess Cx: abundant GPC, reincubated 1/1 COVID: negative 1/1 Influenza: negative 1/2 HIV: non-reactive 1/4 blood x 2: sent   Thank you for allowing pharmacy to be a part of this patient's care.  Arty Baumgartner, [place] Phone: [phone] 02/27/2019 10:14 AM

## 2019-02-28 LAB — COMPREHENSIVE METABOLIC PANEL
ALT: 39 U/L (ref 0–44)
AST: 51 U/L — ABNORMAL HIGH (ref 15–41)
Albumin: 1.8 g/dL — ABNORMAL LOW (ref 3.5–5.0)
Alkaline Phosphatase: 86 U/L (ref 38–126)
Anion gap: 11 (ref 5–15)
BUN: <5 mg/dL — ABNORMAL LOW (ref 6–20)
CO2: 22 mmol/L (ref 22–32)
Calcium: 8.4 mg/dL — ABNORMAL LOW (ref 8.9–10.3)
Chloride: 105 mmol/L (ref 98–111)
Creatinine, Ser: 0.51 mg/dL (ref 0.44–1.00)
GFR calc Af Amer: >60 mL/min (ref >60)
GFR calc non Af Amer: >60 mL/min (ref >60)
Glucose, Bld: 116 mg/dL — ABNORMAL HIGH (ref 70–99)
Potassium: 3.7 mmol/L (ref 3.5–5.1)
Sodium: 138 mmol/L (ref 135–145)
Total Bilirubin: <0.1 mg/dL — ABNORMAL LOW (ref 0.3–1.2)
Total Protein: 6 g/dL — ABNORMAL LOW (ref 6.5–8.1)

## 2019-02-28 LAB — CBC
HCT: 27.8 % — ABNORMAL LOW (ref 36.0–46.0)
Hemoglobin: 8.2 g/dL — ABNORMAL LOW (ref 12.0–15.0)
MCH: 24.3 pg — ABNORMAL LOW (ref 26.0–34.0)
MCHC: 29.5 g/dL — ABNORMAL LOW (ref 30.0–36.0)
MCV: 82.2 fL (ref 80.0–100.0)
Platelets: 638 10*3/uL — ABNORMAL HIGH (ref 150–400)
RBC: 3.38 MIL/uL — ABNORMAL LOW (ref 3.87–5.11)
RDW: 15.1 % (ref 11.5–15.5)
WBC: 5.9 10*3/uL (ref 4.0–10.5)
nRBC: 1.4 % — ABNORMAL HIGH (ref 0.0–0.2)

## 2019-02-28 MED ORDER — SODIUM CHLORIDE 0.9% FLUSH: INTRAVENOUS | 0 refills | Status: DC

## 2019-02-28 MED ORDER — CEPHALEXIN 500 MG PO CAPS: 500.0000 mg | ORAL_CAPSULE | Freq: Four times a day (QID) | ORAL | 0 refills | Status: AC

## 2019-02-28 MED ORDER — HYDROCODONE-ACETAMINOPHEN 5-325 MG PO TABS: 1.0000 | ORAL_TABLET | ORAL | 0 refills | Status: AC | PRN

## 2019-02-28 NOTE — Progress Notes (Signed)
Subjective: CC: She reports that she feels better. No abdominal pain, n/v. Tolerating diet but doesn't like ensure. Having bm's.   Objective: Vital signs in last 24 hours: Temp:  [97.9 F (36.6 C)-99 F (37.2 C)] 98.1 F (36.7 C) (01/05 0807) Pulse Rate:  [94-98] 98 (01/05 0807) Resp:  [17-19] 18 (01/05 0807) BP: (113-136)/(60-89) 113/60 (01/05 0807) SpO2:  [96 %-100 %] 98 % (01/05 0807) Last BM Date: 02/18/19  Intake/Output from previous day: 01/04 0701 - 01/05 0700 In: 2582.4 [P.O.:960; I.V.:1175; IV Piggyback:437.4] Out: 172 [Drains:172] Intake/Output this shift: No intake/output data recorded.  PE: Gen: Awake and alert, NAD Lungs: Normal rate and effort Abd: Soft, ND, NT, +BS. Drain in place with bloody s/s drainage.  Msk: no edema   Lab Results:  Recent Labs    02/27/19 0635 02/28/19 0640  WBC 4.9 5.9  HGB 7.5* 8.2*  HCT 25.5* 27.8*  PLT 535* 638*   BMET Recent Labs    02/27/19 0635 02/28/19 0640  NA 137 138  K 2.8* 3.7  CL 102 105  CO2 23 22  GLUCOSE 107* 116*  BUN <5* <5*  CREATININE 0.48 0.51  CALCIUM 8.2* 8.4*   PT/INR Recent Labs    02/27/19 0635  LABPROT 17.4*  INR 1.4*   CMP     Component Value Date/Time   NA 138 02/28/2019 0640   K 3.7 02/28/2019 0640   CL 105 02/28/2019 0640   CO2 22 02/28/2019 0640   GLUCOSE 116 (H) 02/28/2019 0640   BUN <5 (L) 02/28/2019 0640   CREATININE 0.51 02/28/2019 0640   CREATININE 0.70 01/09/2019 1032   CALCIUM 8.4 (L) 02/28/2019 0640   PROT 6.0 (L) 02/28/2019 0640   ALBUMIN 1.8 (L) 02/28/2019 0640   AST 51 (H) 02/28/2019 0640   ALT 39 02/28/2019 0640   ALKPHOS 86 02/28/2019 0640   BILITOT <0.1 (L) 02/28/2019 0640   GFRNONAA >60 02/28/2019 0640   GFRNONAA 107 01/09/2019 1032   GFRAA >60 02/28/2019 0640   GFRAA 124 01/09/2019 1032   Lipase     Component Value Date/Time   LIPASE 25 02/24/2019 1328       Studies/Results: No results found.  Anti-infectives: Anti-infectives  (From admission, onward)   Start     Dose/Rate Route Frequency Ordered Stop   02/28/19 0000  cephALEXin (KEFLEX) 500 MG capsule     500 mg Oral 4 times daily 02/28/19 0901 03/10/19 2359   02/27/19 1800  cephALEXin (KEFLEX) capsule 500 mg     500 mg Oral 4 times daily 02/27/19 1439     02/25/19 0600  vancomycin (VANCOREADY) IVPB 1250 mg/250 mL  Status:  Discontinued     1,250 mg 166.7 mL/hr over 90 Minutes Intravenous Every 12 hours 02/24/19 1657 02/27/19 1439   02/25/19 0200  ceFEPIme (MAXIPIME) 2 g in sodium chloride 0.9 % 100 mL IVPB  Status:  Discontinued     2 g 200 mL/hr over 30 Minutes Intravenous Every 8 hours 02/24/19 1657 02/27/19 1439   02/24/19 1700  metroNIDAZOLE (FLAGYL) IVPB 500 mg     500 mg 100 mL/hr over 60 Minutes Intravenous  Once 02/24/19 1650 02/24/19 2035   02/24/19 1700  ceFEPIme (MAXIPIME) 2 g in sodium chloride 0.9 % 100 mL IVPB     2 g 200 mL/hr over 30 Minutes Intravenous  Once 02/24/19 1652 02/24/19 1804   02/24/19 1700  vancomycin (VANCOREADY) IVPB 2000 mg/400 mL  2,000 mg 200 mL/hr over 120 Minutes Intravenous  Once 02/24/19 1652 02/24/19 2247       Assessment/Plan Bipolar d/o H/o lap sleeve gastrectomy 01/2014 H/o perf diverticulitis s/p hartmann (~2004) and later reversal H/o ventral incisional hernia repair at [employer] many years complicated by chronic seroma >1 mo nausea/intermittent vomiting Anemia - iron deficient  Protein calorie malnutrition  - alb 2.8, prealbumin 6.5 Elevated INR  Recurrent chronic abdominal wall seroma - S/p drain placement.  Cx w/ MSSA - Keflex - Please have nutrition consult prior to discharge - her compliance with a bariatric meal plan unfortunately has always been a challenge.  - Cont Ensure supplements for malnutrition. She reports that she plans to drink [employer] protein shakes after d/c. She will also pick up vitamins to take at home (multivitamin, d3, etc) - We will follow peripherally. Okay for d/c pending  nutrition clearance; anticipate now that infectious source has been addressed, this should be less of an issue - in the past she reports dietary improvement following addressing her seroma infection  FEN: Reg VTE: Scds, Okay for chemical prophylaxis from a surgical standpoint ID: Flagyl 1/1. Maxipime/Vanc 1/1 -1/4. Keflex >> Follow-Up: [name]   LOS: 4 days    Jillyn Ledger , [facility] 02/28/2019, 9:02 AM Please see Amion for pager number during day hours 7:00am-4:30pm

## 2019-02-28 NOTE — Progress Notes (Addendum)
JP drain emptying and flushing education given to pt, demonstrated by pt. JP site dry and intact, dressing changed. Discharge instructions given to pt, verbalized understanding. Discharged to home accompanied by her mother. Aderrall 1/2 tab wasted witnessed by [facility].

## 2019-02-28 NOTE — Discharge Instructions (Signed)
Vitamins and Minerals . 2 Chewable Multivitamin / Multimineral Supplement with iron (i.e. Centrum for Adults) . Vitamin B-12, 350-500 micrograms sub-lingual (place tablet under the tongue) each day . Chewable Calcium Citrate with Vitamin D-3 (Example: 3 Chewable Calcium  Plus 600 with Vitamin D-3) o Take 500 mg three (3) times a day for a total of 1500 mg each day o Do not take all 3 doses of calcium at one time as it may cause constipation, and you can only absorb 500 mg at a time o Do not mix multivitamins containing iron with calcium supplements;  take 2 hours apart o Do not substitute Tums (calcium carbonate) for your calcium . Menstruating women and those at risk for anemia ( a blood disease that causes weakness) may need extra iron o Talk to your doctor to see if you need more iron . If you need extra iron: Total daily Iron recommendation (including Vitamins) is 50 to 100 mg Iron/day . Do not stop taking or change any vitamins or minerals until you talk to your nutritionist or surgeon . Your nutritionist and/or surgeon must approve all vitamin and mineral supplements    Please follow your nutritionist recommendations on protein and dietary intake.   Please take your antibiotics as prescribed to completion  Percutaneous Abscess Drain, Care After This sheet gives you information about how to care for yourself after your procedure. Your health care provider may also give you more specific instructions. If you have problems or questions, contact your health care provider. What can I expect after the procedure? After your procedure, it is common to have:  A small amount of bruising and discomfort in the area where the drainage tube (catheter) was placed.  Sleepiness and fatigue. This should go away after the medicines you were given have worn off. Follow these instructions at home: Incision care  Follow instructions from your health care provider about how to take care of your  incision. Make sure you: ? Wash your hands with soap and water before you change your bandage (dressing). If soap and water are not available, use hand sanitizer. ? Change your dressing as told by your health care provider. ? Leave stitches (sutures), skin glue, or adhesive strips in place. These skin closures may need to stay in place for 2 weeks or longer. If adhesive strip edges start to loosen and curl up, you may trim the loose edges. Do not remove adhesive strips completely unless your health care provider tells you to do that.  Check your incision area every day for signs of infection. Check for: ? More redness, swelling, or pain. ? More fluid or blood. ? Warmth. ? Pus or a bad smell. ? Fluid leaking from around your catheter (instead of fluid draining through your catheter). Catheter care   Follow instructions from your health care provider about emptying and cleaning your catheter and collection bag. You may need to clean the catheter every day so it does not clog.  If directed, write down the following information every time you empty your bag: ? The date and time. ? The amount of drainage. General instructions  Rest at home for 1-2 days after your procedure. Return to your normal activities as told by your health care provider.  Do not take baths, swim, or use a hot tub for 24 hours after your procedure, or until your health care provider says that this is okay.  Take over-the-counter and prescription medicines only as told by your health care provider.  Keep all follow-up visits as told by your health care provider. This is important. Contact a health care provider if:  You have less than 10 mL of drainage a day for 2-3 days in a row, or as directed by your health care provider.  You have more redness, swelling, or pain around your incision area.  You have more fluid or blood coming from your incision area.  Your incision area feels warm to the touch.  You have pus  or a bad smell coming from your incision area.  You have fluid leaking from around your catheter (instead of through your catheter).  You have a fever or chills.  You have pain that does not get better with medicine. Get help right away if:  Your catheter comes out.  You suddenly stop having drainage from your catheter.  You suddenly have blood in the fluid that is draining from your catheter.  You become dizzy or you faint.  You develop a rash.  You have nausea or vomiting.  You have difficulty breathing or you feel short of breath.  You develop chest pain.  You have problems with your speech or vision.  You have trouble balancing or moving your arms or legs. Summary  It is common to have a small amount of bruising and discomfort in the area where the drainage tube (catheter) was placed.  You may be directed to record the amount of drainage from the bag every time you empty it.  Follow instructions from your health care provider about emptying and cleaning your catheter and collection bag. This information is not intended to replace advice given to you by your health care provider. Make sure you discuss any questions you have with your health care provider. Document Revised: 01/22/2017 Document Reviewed: 01/02/2016 [employer] Patient Education  2020 [employer].

## 2019-02-28 NOTE — Discharge Summary (Signed)
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 will remain  in effect (meaning this test can be used) for the duration of the COVID-19 declaration under Section 564(b)(1) of the Act, 21 U.S.C. section 360bbb-3(b)(1), unless the authorization is terminated or revoked sooner.    Influenza A by PCR NEGATIVE NEGATIVE Final   Influenza B by PCR NEGATIVE NEGATIVE Final    Comment: (NOTE) The Xpert Xpress SARS-CoV-2/FLU/RSV assay is intended as an aid in  the diagnosis of influenza from Nasopharyngeal swab specimens and  should not be used as a sole basis for treatment. Nasal washings and  aspirates are unacceptable for Xpert Xpress SARS-CoV-2/FLU/RSV  testing. Fact Sheet for Patients: PinkCheek.be Fact Sheet for Healthcare Providers: GravelBags.it This test is not yet approved or cleared by the [place] FDA and  has been  authorized for detection and/or diagnosis of SARS-CoV-2 by  FDA under an Emergency Use Authorization (EUA). This EUA will remain  in effect (meaning this test can be used) for the duration of the  Covid-19 declaration under Section 564(b)(1) of the Act, 21  U.S.C. section 360bbb-3(b)(1), unless the authorization is  terminated or revoked. Performed at [facility], [place] [address] [place], [place] [address]   Aerobic/Anaerobic Culture (surgical/deep wound)     Status: None (Preliminary result)   Collection Time: 02/25/19 12:05 PM   Specimen: Abscess  Result Value Ref Range Status   Specimen Description ABSCESS  Final   Special Requests Normal  Final   Gram Stain   Final    ABUNDANT WBC PRESENT, PREDOMINANTLY PMN ABUNDANT GRAM POSITIVE COCCI Performed at [facility], [place] [address] [place], [place] [address]    Culture   Final    ABUNDANT STAPHYLOCOCCUS AUREUS NO  ANAEROBES ISOLATED; CULTURE IN PROGRESS FOR 5 DAYS    Report Status PENDING  Incomplete   Organism ID, Bacteria STAPHYLOCOCCUS AUREUS  Final      Susceptibility   Staphylococcus aureus - MIC*    CIPROFLOXACIN <=0.5 SENSITIVE Sensitive     ERYTHROMYCIN RESISTANT Resistant     GENTAMICIN <=0.5 SENSITIVE Sensitive     OXACILLIN <=0.25 SENSITIVE Sensitive     TETRACYCLINE <=1 SENSITIVE Sensitive     VANCOMYCIN <=0.5 SENSITIVE Sensitive     TRIMETH/SULFA <=10 SENSITIVE Sensitive     CLINDAMYCIN RESISTANT Resistant     RIFAMPIN <=0.5 SENSITIVE Sensitive     Inducible Clindamycin POSITIVE Resistant     * ABUNDANT STAPHYLOCOCCUS AUREUS     Labs: BNP (last 3 results) No results for input(s): BNP in the last 8760 hours. Basic Metabolic Panel: Recent Labs  Lab 02/24/19 1328 02/24/19 1551 02/25/19 0018 02/27/19 0635 02/28/19 0640  NA 134*  --  134* 137 138  K 2.7*  --  3.6 2.8* 3.7  CL 95*  --  102 102 105  CO2 23  --  19* 23 22  GLUCOSE 131*  --  91 107* 116*  BUN 6  --  5* <5* <5*  CREATININE 0.64   --  0.63 0.48 0.51  CALCIUM 8.4*  --  8.1* 8.2* 8.4*  MG  --  1.9  --   --   --    Liver Function Tests: Recent Labs  Lab 02/24/19 1328 02/25/19 0018 02/27/19 0635 02/28/19 0640  AST 44* 39 35 51*  ALT 33 29 24 39  ALKPHOS 113 106 79 86  BILITOT 0.3 0.4 0.3 <0.1*  PROT 7.1 6.7 5.6* 6.0*  ALBUMIN 2.3* 2.2* 1.7* 1.8*   Recent Labs  Lab 02/24/19 1328  LIPASE 25   No results for input(s): AMMONIA in the last 168 hours. CBC: Recent Labs  Lab 02/24/19 1328 02/24/19 2322 02/25/19 0018 02/27/19 0635 02/28/19 0640  WBC 8.7  --  8.4 4.9 5.9  HGB 9.4*  --  8.5* 7.5* 8.2*  HCT 31.8*  --  27.8* 25.5* 27.8*  MCV 80.5  --  78.8* 82.3 82.2  PLT 663* 561* 524* 535* 638*   Cardiac Enzymes: No results for input(s): CKTOTAL, CKMB, CKMBINDEX, TROPONINI in the last 168 hours. BNP: Invalid input(s): POCBNP CBG: No results for input(s): GLUCAP in the last 168 hours. D-Dimer No results for input(s): DDIMER in the last 72 hours. Hgb A1c No results for input(s): HGBA1C in the last 72 hours. Lipid Profile No results for input(s): CHOL, HDL, LDLCALC, TRIG, CHOLHDL, LDLDIRECT in the last 72 hours. Thyroid function studies No results for input(s): TSH, T4TOTAL, T3FREE, THYROIDAB in the last 72 hours.  Invalid input(s): FREET3 Anemia work up Recent Labs    02/25/19 1022  VITAMINB12 602  FOLATE 8.7  FERRITIN 212  TIBC 157*  IRON 9*  RETICCTPCT 1.4   Urinalysis    Component Value Date/Time   COLORURINE YELLOW 02/24/2019 [place] 02/24/2019 1740   LABSPEC 1.013 02/24/2019 1740   PHURINE 7.0 02/24/2019 1740   GLUCOSEU NEGATIVE 02/24/2019 1740   HGBUR NEGATIVE 02/24/2019 1740   BILIRUBINUR NEGATIVE 02/24/2019 1740   KETONESUR NEGATIVE 02/24/2019 1740   PROTEINUR NEGATIVE 02/24/2019 1740   UROBILINOGEN 0.2 10/12/2010 1319   NITRITE NEGATIVE 02/24/2019 1740   LEUKOCYTESUR NEGATIVE 02/24/2019 1740   Sepsis Labs Invalid input(s): PROCALCITONIN,  WBC,   LACTICIDVEN Microbiology Recent Results (from the past 240  hour(s))  Urine culture     Status: Abnormal   Collection Time: 02/24/19  4:50 PM   Specimen: In/Out Cath Urine  Result Value Ref Range Status   Specimen Description IN/OUT CATH URINE  Final   Special Requests   Final    NONE Performed at [facility], [address] [place], [place] [address]    Culture MULTIPLE SPECIES PRESENT, SUGGEST RECOLLECTION (A)  Final   Report Status 02/25/2019 FINAL  Final  Blood Culture (routine x 2)     Status: Abnormal   Collection Time: 02/24/19  6:08 PM   Specimen: BLOOD  Result Value Ref Range Status   Specimen Description BLOOD RIGHT ANTECUBITAL  Final   Special Requests   Final    BOTTLES DRAWN AEROBIC AND ANAEROBIC Blood Culture adequate volume   Culture  Setup Time   Final    GRAM POSITIVE COCCI IN BOTH AEROBIC AND ANAEROBIC BOTTLES CRITICAL RESULT CALLED TO, READ BACK BY AND VERIFIED WITH: K. HANNONS, PHARMD AT 1600 ON 02/25/19 BY C. JESSUP, MT.    Culture (A)  Final    STAPHYLOCOCCUS SPECIES (COAGULASE NEGATIVE) THE SIGNIFICANCE OF ISOLATING THIS ORGANISM FROM A SINGLE SET OF BLOOD CULTURES WHEN MULTIPLE SETS ARE DRAWN IS UNCERTAIN. PLEASE NOTIFY THE MICROBIOLOGY DEPARTMENT WITHIN ONE WEEK IF SPECIATION AND SENSITIVITIES ARE REQUIRED. Performed at [facility], [place] [address] [place], [place] [address]    Report Status 02/27/2019 FINAL  Final  Blood Culture (routine x 2)     Status: None (Preliminary result)   Collection Time: 02/24/19  6:09 PM   Specimen: BLOOD  Result Value Ref Range Status   Specimen Description BLOOD LEFT ANTECUBITAL  Final   Special Requests   Final    BOTTLES DRAWN AEROBIC AND ANAEROBIC Blood Culture results may not be optimal due to an excessive volume of blood received in culture bottles   Culture   Final    NO GROWTH 3 DAYS Performed at [facility], [place] [address] [place], [place] [address]    Report Status PENDING  Incomplete   Respiratory Panel by RT PCR (Flu A&B, Covid) - Nasopharyngeal Swab     Status: None   Collection Time: 02/24/19  8:36 PM   Specimen: Nasopharyngeal Swab  Result Value Ref Range Status   SARS Coronavirus 2 by RT PCR NEGATIVE NEGATIVE Final    Comment: (NOTE) SARS-CoV-2 target nucleic acids are NOT DETECTED. The SARS-CoV-2 RNA is generally detectable in upper respiratoy specimens during the acute phase of infection. The lowest concentration of SARS-CoV-2 viral copies this assay can detect is 131 copies/mL. A negative result does not preclude SARS-Cov-2 infection and should not be used as the sole basis for treatment or other patient management decisions. A negative result may occur with  improper specimen collection/handling, submission of specimen other than nasopharyngeal swab, presence of viral mutation(s) within the areas targeted by this assay, and inadequate number of viral copies (<131 copies/mL). A negative result must be combined with clinical observations, patient history, and epidemiological information. The expected result is Negative. Fact Sheet for Patients:  PinkCheek.be Fact Sheet for Healthcare Providers:  GravelBags.it This test is not yet ap proved or cleared by the [place] FDA and  has been authorized for detection and/or diagnosis of SARS-CoV-2 by FDA under an Emergency Use Authorization (EUA). This EUA will remain  in effect (meaning this test can be used) for the duration of the COVID-19 declaration under Section 564(b)(1)  of the Act, 21 U.S.C. section 360bbb-3(b)(1), unless the authorization is terminated or revoked sooner.    Influenza A by PCR NEGATIVE NEGATIVE Final   Influenza B by PCR NEGATIVE NEGATIVE Final    Comment: (NOTE) The Xpert Xpress SARS-CoV-2/FLU/RSV assay is intended as an aid in  the diagnosis of influenza from Nasopharyngeal swab specimens and  should not be used as a sole  basis for treatment. Nasal washings and  aspirates are unacceptable for Xpert Xpress SARS-CoV-2/FLU/RSV  testing. Fact Sheet for Patients: PinkCheek.be Fact Sheet for Healthcare Providers: GravelBags.it This test is not yet approved or cleared by the [place] FDA and  has been authorized for detection and/or diagnosis of SARS-CoV-2 by  FDA under an Emergency Use Authorization (EUA). This EUA will remain  in effect (meaning this test can be used) for the duration of the  Covid-19 declaration under Section 564(b)(1) of the Act, 21  U.S.C. section 360bbb-3(b)(1), unless the authorization is  terminated or revoked. Performed at [facility], [place] [address] [place], [place] [address]   Aerobic/Anaerobic Culture (surgical/deep wound)     Status: None (Preliminary result)   Collection Time: 02/25/19 12:05 PM   Specimen: Abscess  Result Value Ref Range Status   Specimen Description ABSCESS  Final   Special Requests Normal  Final   Gram Stain   Final    ABUNDANT WBC PRESENT, PREDOMINANTLY PMN ABUNDANT GRAM POSITIVE COCCI Performed at [facility], [place] [address] [place], [place] [address]    Culture   Final    ABUNDANT STAPHYLOCOCCUS AUREUS NO ANAEROBES ISOLATED; CULTURE IN PROGRESS FOR 5 DAYS    Report Status PENDING  Incomplete   Organism ID, Bacteria STAPHYLOCOCCUS AUREUS  Final      Susceptibility   Staphylococcus aureus - MIC*    CIPROFLOXACIN <=0.5 SENSITIVE Sensitive     ERYTHROMYCIN RESISTANT Resistant     GENTAMICIN <=0.5 SENSITIVE Sensitive     OXACILLIN <=0.25 SENSITIVE Sensitive     TETRACYCLINE <=1 SENSITIVE Sensitive     VANCOMYCIN <=0.5 SENSITIVE Sensitive     TRIMETH/SULFA <=10 SENSITIVE Sensitive     CLINDAMYCIN RESISTANT Resistant     RIFAMPIN <=0.5 SENSITIVE Sensitive     Inducible Clindamycin POSITIVE Resistant     * ABUNDANT STAPHYLOCOCCUS AUREUS     Patient was seen and examined  on the day of discharge and was found to be in stable condition. Time coordinating discharge: 40 minutes including assessment and coordination of care, as well as examination of the patient.   SIGNED:  Dessa Phi, DO [facility] 02/28/2019, 9:05 AM

## 2019-02-28 NOTE — Progress Notes (Signed)
Nutrition Follow-up  DOCUMENTATION CODES:   Obesity unspecified  INTERVENTION:   -D/c Boost Breeze -Continue Ensure Enlive po BID, each supplement provides 350 kcal and 20 grams of protein -Continue MVI with iron daily -Educated pt on post-operative bariatric diet. Provided pt with "Post Surgery Meal Plan: 6 Months After Bariatric Surgery and Beyond", "Bariatric Surgery Vitamin and Mineral Supplements", and "Bariatric Liquid Protein Supplements" handouts from [facility]'s Nutrition Care Manual -Re-referred to [facility] for further support and reinforcement  NUTRITION DIAGNOSIS:   Inadequate oral intake related to poor appetite, nausea, vomiting as evidenced by per patient/family report.  Ongoing  GOAL:   Patient will meet greater than or equal to 90% of their needs  Progressing   MONITOR:   Diet advancement, PO intake, Supplement acceptance, Labs, Skin  REASON FOR ASSESSMENT:   Consult Diet education  ASSESSMENT:   44 yo female admitted with infection of chronic recurrent abdominal wall seroma. PMH includes lap sleeve gastrectomy 01/2014, GERD, diverticulitis, colostomy, Bipolar D/O, asthma, substance abuse, sleep apnea, CVA.  1/2- s/p drain placement into large recurrent abdominal wall seroma (2.4 L red tinged thick fluid removed)  Reviewed I/O's: +2.4 L x 24 hours and +8.9 L since admission  Drain output: 172 ml x 24 hours  Case discussed with RN prior to visit; pt awaiting RD consult for education prior to discharge home today. Pt has a history of gastric sleeve in 2015 and has had difficulty with compliance with diet and vitamin recommendations.  Spoke with pt at bedside, who had a flat affect today. She reports she is eager to go home. Pt reports that she has had difficulty following diet ("it's very poor") ever since prior to undergoing gastric sleeve surgery. Pt reports that her home life is very stressful- she lives with  her grandmother, mother, and 3 small children also live with her (all under 63 years old). Pt reports that PTA she was unable to consume anything other than a cup of yogurt and "everything else came right back up". Since being admitted, pt reports that she has tried to consume at least 3 bites of everything. Noted meal completion documented at 100%.   Pt has been lost to follow-up since 2017 from both NDES and general surgery ("I stopped doing what I was supposed to and was gaining weight and was embarrassed to go back"). Pt reports she had good rapport with Dr Redmond Pulling and Roxan Hockey, RD, an RD at the [facility]; these visits were helpful to her when she went, however, reports she still made minimal progress. She has also been seeing her therapist and psychiatrist and compliant with these medications.   Spent large amount of time with pt (approximately 25 minutes) discussing barriers and assisting in facilitating behavior change. Pt reports her biggest challenge is having foods in her home such as [employer] and ice cream, which she craves when she is watching TV at night. Discussed hunger and satiety cues, as well as healthier snack alternatives. Discussed a general, balanced diet comprised of lean protein, fruits and vegetables, complex carbohydrates, and low fat dairy. Encouraged pt to consume cooked vegetables if this was more tolerable fir her and to keep food diary. Also encouraged small, frequent meals when appetite is poor.   Pt reveals she had a stroke approximately one year ago and needs to take a daily aspirin for this. Pt shares that it is often difficult to remember to take her  vitamins and is upset that she cannot have use gummy vitamins. RD explained rationale for why gummy vitamins were not recommended. Pt also complained about taking iron pills and that she tolerates iron supplements poorly; encouraged pt to take a MVI with iron for better  tolerance, however, pt was skeptical for this ("I don't want to take anything with iron in it!"). Encouraged pt to make a schedule to help her to remember to take her medications and spreading vitamins out for better tolerance. Unfortunately, psych issues and self-limiting behavior seem to be biggest barriers to pt compliance. RD discussed importance of taking vitamins to prevent nutrient deficiencies and further complications. Strongly encouraged continued follow-up with NDES, bariatric surgery, and therapist. Pt agreeable to follow-up.   Labs reviewed.   NUTRITION - FOCUSED PHYSICAL EXAM:    Most Recent Value  Orbital Region  No depletion  Upper Arm Region  No depletion  Thoracic and Lumbar Region  No depletion  Buccal Region  No depletion  Temple Region  No depletion  Clavicle Bone Region  No depletion  Clavicle and Acromion Bone Region  No depletion  Scapular Bone Region  No depletion  Dorsal Hand  No depletion  Patellar Region  No depletion  Anterior Thigh Region  No depletion  Posterior Calf Region  No depletion  Edema (RD Assessment)  None  Hair  Reviewed  Eyes  Reviewed  Mouth  Reviewed  Skin  Reviewed  Nails  Reviewed       Diet Order:   Diet Order            Diet regular Room service appropriate? Yes; Fluid consistency: Thin  Diet effective now              EDUCATION NEEDS:   Not appropriate for education at this time  Skin:  Skin Assessment: Reviewed RN Assessment  Last BM:  02/18/19  Height:   Ht Readings from Last 1 Encounters:  02/24/19 5\' 7"  (1.702 m)    Weight:   Wt Readings from Last 1 Encounters:  02/24/19 113.4 kg    Ideal Body Weight:  61.4 kg  BMI:  Body mass index is 39.16 kg/m.  Estimated Nutritional Needs:   Kcal:  1800-2000  Protein:  110-130 gm  Fluid:  >/= 2 L    Harvey Matlack A. Jimmye Norman, RD, LDN, [place] Registered Dietitian II Certified Diabetes Care and Education Specialist Pager: [phone] After hours Pager:  [phone]

## 2019-02-28 NOTE — Progress Notes (Signed)
Referring Physician(s): Romana Juniper A ([facility])  Supervising Physician: Corrie Mckusick  Patient Status:  [facility] - In-pt  Chief Complaint: "Sore"  Subjective:  History of gastric sleeve surgery in 2015 with subsequent recurrent anterior abdominal wall seroma s/p anterior abdominal wall drain placement in IR 02/25/2019 by Dr. Pascal Lux. Patient awake and alert laying in bed. Complains of full body "soreness"- states its from laying in bed so long. Anterior abdominal wall drain site c/d/i.   Allergies: Patient has no known allergies.  Medications: Prior to Admission medications   Medication Sig Start Date End Date Taking? Authorizing Provider  albuterol (VENTOLIN HFA) 108 (90 Base) MCG/ACT inhaler Inhale 2 puffs into the lungs every 4 (four) hours as needed for wheezing or shortness of breath. 02/01/19  Yes Eastman, Modena Nunnery, MD  aspirin EC 325 MG tablet Take 1 tablet (325 mg total) by mouth daily. 09/08/18  Yes Idol, Almyra Free, PA-C  atorvastatin (LIPITOR) 40 MG tablet TAKE 1 TABLET BY MOUTH EVERY DAY 02/13/19  Yes Susy Frizzle, MD  benztropine (COGENTIN) 2 MG tablet Take 2 mg by mouth at bedtime.   Yes [provider]  clonazePAM (KLONOPIN) 1 MG tablet Take 1 mg by mouth 2 (two) times daily.    Yes [provider]  gabapentin (NEURONTIN) 300 MG capsule Take 300 mg by mouth 3 (three) times daily. 12/23/18  Yes [provider]  haloperidol (HALDOL) 5 MG tablet Take 15 mg by mouth at bedtime.  11/21/14  Yes [provider]  LORazepam (ATIVAN) 1 MG tablet Take 1 mg by mouth daily.   Yes [provider]  NUVIGIL 250 MG tablet Take 250 mg by mouth daily. 11/14/14  Yes [provider]  ondansetron (ZOFRAN ODT) 4 MG disintegrating tablet Take 1 tablet (4 mg total) by mouth every 8 (eight) hours as needed for nausea or vomiting. 02/06/19  Yes Susy Frizzle, MD  QUEtiapine (SEROQUEL) 300 MG tablet Take 600 mg by mouth at bedtime.  11/21/14  Yes  [provider]  topiramate (TOPAMAX) 100 MG tablet Take 100 mg by mouth at bedtime.  12/31/18  Yes [provider]  valACYclovir (VALTREX) 500 MG tablet Take 1 tablet (500 mg total) by mouth daily as needed (fever blister). 01/09/19  Yes Susy Frizzle, MD  amphetamine-dextroamphetamine (ADDERALL) 15 MG tablet Take 15 mg by mouth 2 (two) times daily.    [provider]  azithromycin (ZITHROMAX) 250 MG tablet 2 tabs poqday1, 1 tab poqday 2-5 Patient not taking: Reported on 02/24/2019 02/09/19   Susy Frizzle, MD  benzonatate (TESSALON) 200 MG capsule Take 1 capsule (200 mg total) by mouth 3 (three) times daily as needed for cough. Patient not taking: Reported on 02/24/2019 02/06/19   Susy Frizzle, MD  cephALEXin (KEFLEX) 500 MG capsule Take 1 capsule (500 mg total) by mouth 4 (four) times daily for 10 days. 02/28/19 03/10/19  Dessa Phi, DO  HYDROcodone-acetaminophen (NORCO/VICODIN) 5-325 MG tablet Take 1 tablet by mouth every 4 (four) hours as needed for up to 5 days for moderate pain. 02/28/19 03/05/19  Dessa Phi, DO  HYDROcodone-homatropine (HYCODAN) 5-1.5 MG/5ML syrup Take 5 mLs by mouth every 8 (eight) hours as needed for cough. Patient not taking: Reported on 02/24/2019 02/09/19   Jenna Luo T, MD  sodium chloride flush (NS) 0.9 % SOLN Flush drain 47ml once daily 02/28/19   Dessa Phi, DO     Vital Signs: BP 113/60 (BP Location: Left Arm)  Pulse 98   Temp 98.1 F (36.7 C) (Oral)   Resp 18   Ht 5\' 7"  (1.702 m)   Wt 250 lb (113.4 kg)   LMP 02/14/2019 (LMP Unknown)   SpO2 98%   BMI 39.16 kg/m   Physical Exam Vitals and nursing note reviewed.  Constitutional:      General: She is not in acute distress.    Appearance: Normal appearance.  Pulmonary:     Effort: Pulmonary effort is normal. No respiratory distress.  Abdominal:     Comments: Anterior abdominal wall drain site without tenderness, erythema, drainage, or active bleeding;  approximately 25 cc dark red fluid in suction bulb; drain flushes/aspirates without resistance.  Skin:    General: Skin is warm and dry.  Neurological:     Mental Status: She is alert and oriented to person, place, and time.  Psychiatric:        Mood and Affect: Mood normal.        Behavior: Behavior normal.     Imaging: CT ABDOMEN PELVIS W CONTRAST  Result Date: 02/24/2019 CLINICAL DATA:  Fever nausea and vomiting EXAM: CT ABDOMEN AND PELVIS WITH CONTRAST TECHNIQUE: Multidetector CT imaging of the abdomen and pelvis was performed using the standard protocol following bolus administration of intravenous contrast. CONTRAST:  132mL OMNIPAQUE IOHEXOL 300 MG/ML  SOLN COMPARISON:  CT 02/19/2019, 04/22/2015, 06/05/2014 FINDINGS: Lower chest: Lung bases demonstrate multiple pulmonary nodules, stable since February 19, 2019, more prominent as compared with 2017. Largest nodules are visualized within the right lung base and measure up to 5 mm. The heart is nonenlarged. Enlarged right hilar nodes measuring up to 13 mm. Enlarged distal esophageal nodes measuring up to 16 mm. Infrahilar node on the right is present on 2016 study, distal esophageal nodes were present but may be slightly larger on today's study. Hepatobiliary: Calcified gallstones. No biliary dilatation. Nonspecific linear hypodensity within the left hepatic lobe Pancreas: Unremarkable. No pancreatic ductal dilatation or surrounding inflammatory changes. Spleen: Normal in size without focal abnormality. Adrenals/Urinary Tract: Adrenal glands are normal. Small subcentimeter hypodense lesions within both kidneys too small to further characterize but probably cysts. No hydronephrosis. The bladder is normal. Stomach/Bowel: Postsurgical changes of the stomach. No dilated small bowel. Diverticular disease of the colon without acute inflammatory change. Negative appendix. Vascular/Lymphatic: Nonaneurysmal aorta. Subcentimeter retroperitoneal nodes.  Reproductive: Uterus and bilateral adnexa are unremarkable. Other: Negative for free air or free fluid. Large fluid collection within the subcutaneous fat of the midline anterior abdominal wall, this measures 22 by 12.1 by 14.5 cm. Collection demonstrates slightly thick wall posteriorly. Musculoskeletal: Degenerative changes of the lumbar spine. No acute or suspicious osseous abnormality. IMPRESSION: 1. Large fluid collection within the subcutaneous fat of the midline anterior abdominal wall measuring 22 x 12.1 x 14.5 cm, corresponding to the history of recurrent abdominal wall seroma. 2. Gallstones 3. Colon diverticular disease without acute inflammatory change 4. Multiple pulmonary nodules at the lung bases. See previous recommendation from 02/19/2019 CT. Incompletely visualized hilar and lower mediastinal adenopathy, stable to slight increase since 2016. Nonemergent dedicated chest CT could be obtained to more thoroughly evaluate the adenopathy and slight increased pulmonary nodules at the bases. Electronically Signed   By: Donavan Foil M.D.   On: 02/24/2019 19:37   DG Chest Port 1 View  Result Date: 02/24/2019 CLINICAL DATA:  Pt to ER for evaluation of nausea, vomiting, and inability to tolerate PO. Reports has been feeling this way for approximately 1 month. EXAM: PORTABLE CHEST  1 VIEW COMPARISON:  None. FINDINGS: Stable cardiomediastinal contours. Low lung volumes. Bronchovascular crowding. No new focal infiltrate. No pneumothorax or large pleural effusion. No acute finding in the visualized skeleton. IMPRESSION: Low volume study with bronchovascular crowding. No definite new infiltrate. Electronically Signed   By: Audie Pinto M.D.   On: 02/24/2019 17:20   CT IMAGE GUIDED DRAINAGE BY PERCUTANEOUS CATHETER  Result Date: 02/25/2019 INDICATION: History of gastric sleeve surgery complicated by development of a recurrent abdominal wall seroma which initially underwent ultrasound-guided drainage  catheter placement on 05/04/2014 which was subsequently removed only to recur requiring CT-guided drainage catheter placement 07/27/2014. Patient ultimately went operative debridement however unfortunately, the patient returns to the emergency department with fever and recurrent abdominal pain with CT scan demonstrating a recurrent large abdominal wall seroma worrisome for infection of recurrent body wall seroma. EXAM: CT IMAGE GUIDED DRAINAGE BY PERCUTANEOUS CATHETER COMPARISON:  CT abdomen pelvis-02/24/2019; 04/12/2015; 06/05/2014; 05/03/2014; ultrasound-guided abdominal wall abscess drainage catheter placement-05/04/2014; CT-guided percutaneous drainage catheter placement-07/27/2014 MEDICATIONS: The patient is currently admitted to the hospital and receiving intravenous antibiotics. The antibiotics were administered within an appropriate time frame prior to the initiation of the procedure. ANESTHESIA/SEDATION: Moderate (conscious) sedation was employed during this procedure. A total of Versed 1 mg and Fentanyl 50 mcg was administered intravenously. Moderate Sedation Time: 23 minutes. The patient's level of consciousness and vital signs were monitored continuously by radiology nursing throughout the procedure under my direct supervision. CONTRAST:  None COMPLICATIONS: None immediate. PROCEDURE: Informed written consent was obtained from the patient after a discussion of the risks, benefits and alternatives to treatment. The patient was placed supine on the CT gantry and a pre procedural CT was performed re-demonstrating the known abscess/fluid collection within the anterior abdominal wall with dominant component measuring at least 22.7 x 12.4 cm (image 33, series 2). The procedure was planned. A timeout was performed prior to the initiation of the procedure. The skin overlying the ventral aspect of the right mid hemiabdomen was prepped and draped in the usual sterile fashion. The overlying soft tissues were  anesthetized with 1% lidocaine with epinephrine. An 18 gauge trocar needle was advanced into the abscess/fluid collection and a short Amplatz super stiff wire was coiled within the collection. The tract was serially dilated allowing placement of a 12 [place] all-purpose drainage catheter. Appropriate positioning was confirmed with a limited postprocedural CT scan. Next, approximately 2.4 L of pink colored thick purulent appearing fluid was aspirated. The tube was connected to a JP bulb and sutured in place. A dressing was placed. The patient tolerated the procedure well without immediate post procedural complication. IMPRESSION: Successful CT guided placement of a 63 French all purpose drain catheter into the recurrent presumed superinfected abdominal wall seroma with aspiration of 2.4 L of tank color, thick, purulent appearing fluid. A representative aspirated sample was capped and sent to laboratory for analysis. Electronically Signed   By: Sandi Mariscal M.D.   On: 02/25/2019 12:55    Labs:  CBC: Recent Labs    02/24/19 1328 02/24/19 2322 02/25/19 0018 02/27/19 0635 02/28/19 0640  WBC 8.7  --  8.4 4.9 5.9  HGB 9.4*  --  8.5* 7.5* 8.2*  HCT 31.8*  --  27.8* 25.5* 27.8*  PLT 663* 561* 524* 535* 638*    COAGS: Recent Labs    02/24/19 1740 02/24/19 2322 02/27/19 0635  INR 2.4* 2.5* 1.4*  APTT 59* 51*  --     BMP: Recent Labs  02/24/19 1328 02/25/19 0018 02/27/19 0635 02/28/19 0640  NA 134* 134* 137 138  K 2.7* 3.6 2.8* 3.7  CL 95* 102 102 105  CO2 23 19* 23 22  GLUCOSE 131* 91 107* 116*  BUN 6 5* <5* <5*  CALCIUM 8.4* 8.1* 8.2* 8.4*  CREATININE 0.64 0.63 0.48 0.51  GFRNONAA >60 >60 >60 >60  GFRAA >60 >60 >60 >60    LIVER FUNCTION TESTS: Recent Labs    02/24/19 1328 02/25/19 0018 02/27/19 0635 02/28/19 0640  BILITOT 0.3 0.4 0.3 <0.1*  AST 44* 39 35 51*  ALT 33 29 24 39  ALKPHOS 113 106 79 86  PROT 7.1 6.7 5.6* 6.0*  ALBUMIN 2.3* 2.2* 1.7* 1.8*    Assessment  and Plan:  History of gastric sleeve surgery in 2015 with subsequent recurrent anterior abdominal wall seroma s/p anterior abdominal wall drain placement in IR 02/25/2019 by Dr. Pascal Lux. Anterior abdominal wall drain stable with approximately 25 cc dark red fluid in suction bulb (additional 172 cc output from drain in past 24 hours per chart). Continue current drain management- continue with Qshift flushes/monitor of output. Plan for repeat CT/possible drain injection when output <10 cc/day (assess for possible removal). Further plans per [facility]- appreciate and agree with management.  If patient is to be discharged, below are discharge instructions: - Flush each drain once daily with 5-10 cc NS flush Mable Fill, RN aware to instruct patient on how to manage drains at home). - Record output from each drain once daily. - Follow-up at drain clinic 10-14 days after discharge for CT/possible drain injection (assess for possible drain removal)- order placed to facilitate this.  Please call IR with questions/concerns.   Electronically Signed: Earley Abide, PA-C 02/28/2019, 11:17 AM   I spent a total of 25 Minutes at the the patient's bedside AND on the patient's hospital floor or unit, greater than 50% of which was counseling/coordinating care for anterior abdominal wall seroma s/p drain placement.

## 2019-03-01 ENCOUNTER — Other Ambulatory Visit: Payer: Self-pay | Admitting: General Surgery

## 2019-03-01 DIAGNOSIS — S301XXD Contusion of abdominal wall, subsequent encounter: Secondary | ICD-10-CM

## 2019-03-01 LAB — CULTURE, BLOOD (ROUTINE X 2): Culture: NO GROWTH

## 2019-03-01 LAB — VITAMIN A: Vitamin A (Retinoic Acid): 10.6 ug/dL — ABNORMAL LOW (ref 20.1–62.0)

## 2019-03-02 ENCOUNTER — Telehealth: Payer: Self-pay | Admitting: Family Medicine

## 2019-03-02 LAB — AEROBIC/ANAEROBIC CULTURE W GRAM STAIN (SURGICAL/DEEP WOUND): Special Requests: NORMAL

## 2019-03-02 NOTE — Telephone Encounter (Signed)
Left message for patient to return call so that I may follow up with her from her recent hospitalization.

## 2019-03-03 LAB — VITAMIN B1: Vitamin B1 (Thiamine): 40.6 nmol/L — ABNORMAL LOW (ref 66.5–200.0)

## 2019-03-04 LAB — CULTURE, BLOOD (ROUTINE X 2)
Culture: NO GROWTH
Culture: NO GROWTH

## 2019-03-06 ENCOUNTER — Ambulatory Visit (INDEPENDENT_AMBULATORY_CARE_PROVIDER_SITE_OTHER): Payer: Medicare Other | Admitting: Family Medicine

## 2019-03-06 ENCOUNTER — Other Ambulatory Visit: Payer: Self-pay

## 2019-03-06 ENCOUNTER — Encounter: Payer: Self-pay | Admitting: Family Medicine

## 2019-03-06 VITALS — BP 130/80 | HR 110 | Temp 97.8°F | Resp 18 | Ht 68.0 in | Wt 253.0 lb

## 2019-03-06 DIAGNOSIS — D508 Other iron deficiency anemias: Secondary | ICD-10-CM | POA: Diagnosis not present

## 2019-03-06 DIAGNOSIS — T8143XA Infection following a procedure, organ and space surgical site, initial encounter: Secondary | ICD-10-CM | POA: Diagnosis not present

## 2019-03-06 DIAGNOSIS — D539 Nutritional anemia, unspecified: Secondary | ICD-10-CM

## 2019-03-06 DIAGNOSIS — E538 Deficiency of other specified B group vitamins: Secondary | ICD-10-CM

## 2019-03-06 MED ORDER — FERROUS SULFATE 324 (65 FE) MG PO TBEC: 1.0000 | DELAYED_RELEASE_TABLET | Freq: Every day | ORAL | 5 refills | Status: DC

## 2019-03-06 MED ORDER — CYANOCOBALAMIN 1000 MCG/ML IJ SOLN
1000.0000 ug | INTRAMUSCULAR | Status: DC
  Administered 2019-03-06 – 2019-07-25 (×5): 1000 ug via INTRAMUSCULAR
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
## 2019-03-06 NOTE — Addendum Note (Signed)
Addended by: Shary Decamp B on: 03/06/2019 12:12 PM   Modules accepted: Orders

## 2019-03-07 ENCOUNTER — Other Ambulatory Visit: Payer: Medicare Other

## 2019-03-07 LAB — COMPLETE METABOLIC PANEL WITH GFR
AG Ratio: 1.3 (calc) (ref 1.0–2.5)
ALT: 20 U/L (ref 6–29)
AST: 16 U/L (ref 10–30)
Albumin: 3.4 g/dL — ABNORMAL LOW (ref 3.6–5.1)
Alkaline phosphatase (APISO): 97 U/L (ref 31–125)
BUN: 13 mg/dL (ref 7–25)
CO2: 23 mmol/L (ref 20–32)
Calcium: 8.9 mg/dL (ref 8.6–10.2)
Chloride: 103 mmol/L (ref 98–110)
Creat: 0.5 mg/dL (ref 0.50–1.10)
GFR, Est African American: 138 mL/min/{1.73_m2} (ref >=60)
GFR, Est Non African American: 119 mL/min/{1.73_m2} (ref >=60)
Globulin: 2.7 g/dL (calc) (ref 1.9–3.7)
Glucose, Bld: 75 mg/dL (ref 65–99)
Potassium: 4.2 mmol/L (ref 3.5–5.3)
Sodium: 137 mmol/L (ref 135–146)
Total Bilirubin: 0.3 mg/dL (ref 0.2–1.2)
Total Protein: 6.1 g/dL (ref 6.1–8.1)

## 2019-03-07 LAB — CBC WITH DIFFERENTIAL/PLATELET
Absolute Monocytes: 499 cells/uL (ref 200–950)
Basophils Absolute: 17 cells/uL (ref 0–200)
Basophils Relative: 0.2 %
Eosinophils Absolute: 249 cells/uL (ref 15–500)
Eosinophils Relative: 2.9 %
HCT: 29.9 % — ABNORMAL LOW (ref 35.0–45.0)
Hemoglobin: 9.1 g/dL — ABNORMAL LOW (ref 11.7–15.5)
Lymphs Abs: 1324 cells/uL (ref 850–3900)
MCH: 24.5 pg — ABNORMAL LOW (ref 27.0–33.0)
MCHC: 30.4 g/dL — ABNORMAL LOW (ref 32.0–36.0)
MCV: 80.6 fL (ref 80.0–100.0)
MPV: 8.4 fL (ref 7.5–12.5)
Monocytes Relative: 5.8 %
Neutro Abs: 6510 cells/uL (ref 1500–7800)
Neutrophils Relative %: 75.7 %
Platelets: 527 10*3/uL — ABNORMAL HIGH (ref 140–400)
RBC: 3.71 10*6/uL — ABNORMAL LOW (ref 3.80–5.10)
RDW: 18.7 % — ABNORMAL HIGH (ref 11.0–15.0)
Total Lymphocyte: 15.4 %
WBC: 8.6 10*3/uL (ref 3.8–10.8)

## 2019-03-08 LAB — FECAL GLOBIN BY IMMUNOCHEMISTRY
FECAL GLOBIN RESULT:: DETECTED — AB
MICRO NUMBER:: 10032938
SPECIMEN QUALITY:: ADEQUATE

## 2019-03-13 NOTE — Telephone Encounter (Signed)
No response from this patient. Closing this referral at this time

## 2019-03-14 ENCOUNTER — Other Ambulatory Visit: Payer: Medicare Other

## 2019-03-15 ENCOUNTER — Other Ambulatory Visit: Payer: Self-pay

## 2019-03-15 ENCOUNTER — Ambulatory Visit
Admission: RE | Admit: 2019-03-15 | Discharge: 2019-03-15 | Disposition: A | Discharge status: Home / Self Care | Payer: Medicare Other | Source: Ambulatory Visit | Attending: Radiology | Admitting: Radiology

## 2019-03-15 ENCOUNTER — Ambulatory Visit
Admission: RE | Admit: 2019-03-15 | Discharge: 2019-03-15 | Disposition: A | Discharge status: Home / Self Care | Payer: Medicare Other | Source: Ambulatory Visit | Attending: General Surgery | Admitting: General Surgery

## 2019-03-15 ENCOUNTER — Encounter: Payer: Self-pay | Admitting: *Deleted

## 2019-03-15 DIAGNOSIS — L02211 Cutaneous abscess of abdominal wall: Secondary | ICD-10-CM | POA: Diagnosis not present

## 2019-03-15 DIAGNOSIS — S301XXD Contusion of abdominal wall, subsequent encounter: Secondary | ICD-10-CM

## 2019-03-15 DIAGNOSIS — K8020 Calculus of gallbladder without cholecystitis without obstruction: Secondary | ICD-10-CM | POA: Diagnosis not present

## 2019-03-15 HISTORY — PX: IR RADIOLOGIST EVAL & MGMT: IMG5224

## 2019-03-15 MED ORDER — IOPAMIDOL (ISOVUE-300) INJECTION 61%
125.0000 mL | Freq: Once | INTRAVENOUS | Status: AC | PRN
  Administered 2019-03-15: 125 mL via INTRAVENOUS

## 2019-03-15 NOTE — Progress Notes (Signed)
Referring Physician(s): Dr. Greer Pickerel  Chief Complaint: The patient is seen in follow up today s/p abdominal wall seroma  History of present illness:  [name] is a 44 year old female with past medical history of anxiety, bipolar disorder, asthma, CVA, diverticulitis s/p bowel resection now with recurrent abdominal wall seroma s/p drain placement 02/24/18 with 2.4 liters removed. Culture on the fluid removed returned positive for staph aureus and she has completed a course of Keflex since discharge from the hospital.   Patient presents to Interventional Radiology drain clinic today for follow-up of her seroma drain. She reports she feels exhausted at the end of each day; "like the day has wiped me out."  She describes feeling "uncomfortable" at night "like something is inside my belly" but denies pain, or discomfort related to her drain.  She states the uncomfortableness is in her lower abdomen.  She denies fever, chills, nausea, vomiting.  States she occasionally feels short of breath. She has kept excellent records at home which showed continued significant output daily of 25-70 mL. She has a good appetite and eating and drink per her usual.   Past Medical History:  Diagnosis Date  . Anemia   . Anxiety   . Arthritis   . Asthma   . Bipolar affective ([place])   . Blood transfusion without reported diagnosis   . Colostomy in place [facility])   . CVA (cerebrovascular accident) ([place])    left cerebellar infarct found accidentally on CT (2020)  . Depression   . Diverticulitis 2008   perforated/ requiring resection  . Family history of adverse reaction to anesthesia    MGM was placed on vent after surgery  . GERD (gastroesophageal reflux disease)   . Headache   . Shortness of breath dyspnea   . Sleep apnea    mild, does not use c-pap machine  . Substance abuse ([place])    12 years ago-crack cocaine    Past Surgical History:  Procedure Laterality Date  . Abdominal wall hernia  07/2007   open repair with lysis of adhesions  . COLONOSCOPY  03/13/2010   [id number] hemorrhoids likely cause of hematochezia, otherwise normal  . Colostomy reversal  11/2006  . ESOPHAGOGASTRODUODENOSCOPY  03/13/2010   [id number] esophagus, status post passage of a Maloney dilator/Small hiatal hernia/ Antral erosions, status post biopsy  . EXAMINATION UNDER ANESTHESIA N/A 05/11/2012   Procedure: EXAM UNDER ANESTHESIA;  Surgeon: Donato Heinz, MD;  Location: [facility];  Service: General;  Laterality: N/A;  . Exploratory laparotomy with resection  2008  . FINGER CLOSED REDUCTION Right 08/05/2012   Procedure: CLOSED REDUCTION RIGHT THUMB (FINGER);  Surgeon: Linna Hoff, MD;  Location: [place];  Service: Orthopedics;  Laterality: Right;  . HERNIA REPAIR    . Hx of abd wall seroma  08/2007   Drained via [place] in [place], [place]  . Hx of abd wall seroma  10/2007   Drained by Dr. Geroge Baseman in office  . Kidney stones    . LAPAROSCOPIC GASTRIC SLEEVE RESECTION N/A 02/13/2014   Procedure: LAPAROSCOPIC GASTRIC SLEEVE RESECTION LYSIS OF ADHESIONS, UPPER ENDOSCOPY;  Surgeon: [name], MD;  [facility];  Service: General;  Laterality: N/A;  . LAPAROSCOPY N/A 09/28/2014   Procedure: LAPAROSCOPY DIAGNOSTIC, INCISION AND DRAINAGE WITH LAPAROSCOPIC EXPLORATION OF ABDOMINAL WALL SEROMA with ultrasound;  Surgeon: Greer Pickerel, MD;  [facility];  Service: General;  Laterality: N/A;  . PLACEMENT OF SETON N/A 05/11/2012   Procedure: PLACEMENT OF  SETON;  Surgeon: Donato Heinz, MD;  [facility]: [facility];  Service: General;  Laterality: N/A;  . root canal 7-16    . TONSILLECTOMY    . TREATMENT FISTULA ANAL      Allergies: Patient has no known allergies.  Medications: Prior to Admission medications   Medication Sig Start Date End Date Taking? Authorizing Provider  albuterol (VENTOLIN HFA) 108 (90 Base) MCG/ACT inhaler Inhale 2 puffs into the lungs every 4 (four) hours as needed for wheezing or shortness of  breath. 02/01/19   [place], Modena Nunnery, MD  amphetamine-dextroamphetamine (ADDERALL) 15 MG tablet Take 15 mg by mouth 2 (two) times daily.    [provider]  aspirin EC 325 MG tablet Take 1 tablet (325 mg total) by mouth daily. 09/08/18   Evalee Jefferson, PA-C  atorvastatin (LIPITOR) 40 MG tablet TAKE 1 TABLET BY MOUTH EVERY DAY 02/13/19   Susy Frizzle, MD  benztropine (COGENTIN) 2 MG tablet Take 2 mg by mouth at bedtime.    [provider]  clonazePAM (KLONOPIN) 1 MG tablet Take 1 mg by mouth 2 (two) times daily.     [provider]  ferrous sulfate 324 (65 Fe) MG TBEC Take 1 tablet (325 mg total) by mouth daily. 03/06/19   Susy Frizzle, MD  gabapentin (NEURONTIN) 300 MG capsule Take 300 mg by mouth 3 (three) times daily. 12/23/18   [provider]  haloperidol (HALDOL) 5 MG tablet Take 15 mg by mouth at bedtime.  11/21/14   [provider]  LORazepam (ATIVAN) 1 MG tablet Take 1 mg by mouth daily.    [provider]  NUVIGIL 250 MG tablet Take 250 mg by mouth daily. 11/14/14   [provider]  ondansetron (ZOFRAN ODT) 4 MG disintegrating tablet Take 1 tablet (4 mg total) by mouth every 8 (eight) hours as needed for nausea or vomiting. 02/06/19   Susy Frizzle, MD  QUEtiapine (SEROQUEL) 300 MG tablet Take 600 mg by mouth at bedtime.  11/21/14   [provider]  sodium chloride flush (NS) 0.9 % SOLN Flush drain 67ml once daily 02/28/19   Dessa Phi, DO  topiramate (TOPAMAX) 100 MG tablet Take 100 mg by mouth at bedtime.  12/31/18   [provider]  valACYclovir (VALTREX) 500 MG tablet Take 1 tablet (500 mg total) by mouth daily as needed (fever blister). 01/09/19   Susy Frizzle, MD     Family History  Problem Relation Age of Onset  . Asthma Maternal Grandmother     Social History   Socioeconomic History  . Marital status: Single    Spouse name: Not on file  . Number of children: 2  . Years of  education: Not on file  . Highest education level: Not on file  Occupational History  . Occupation: Insurance claims handler: UNEMPLOYED  Tobacco Use  . Smoking status: Current Every Day Smoker    Packs/day: 0.50    Years: 14.00    Pack years: 7.00    Types: E-cigarettes    Last attempt to quit: 12/20/2013    Years since quitting: 5.2  . Smokeless tobacco: Never Used  . Tobacco comment: Vapor cigarettes.  Substance and Sexual Activity  . Alcohol use: No    Alcohol/week: 0.0 standard drinks  . Drug use: No  . Sexual activity: Never    Birth control/protection: I.U.D.  Other Topics Concern  . Not on file  Social History Narrative  .  Not on file   Social Determinants of Health   Financial Resource Strain:   . Difficulty of Paying Living Expenses: Not on file  Food Insecurity:   . Worried About Charity fundraiser in the Last Year: Not on file  . Ran Out of Food in the Last Year: Not on file  Transportation Needs:   . Lack of Transportation (Medical): Not on file  . Lack of Transportation (Non-Medical): Not on file  Physical Activity:   . Days of Exercise per Week: Not on file  . Minutes of Exercise per Session: Not on file  Stress:   . Feeling of Stress : Not on file  Social Connections:   . Frequency of Communication with Friends and Family: Not on file  . Frequency of Social Gatherings with Friends and Family: Not on file  . Attends Religious Services: Not on file  . Active Member of Clubs or Organizations: Not on file  . Attends Archivist Meetings: Not on file  . Marital Status: Not on file     Vital Signs: BP 134/87 (BP Location: Right Arm)   Pulse 96   Temp 97.8 F (36.6 C)   LMP 02/14/2019 (LMP Unknown)   SpO2 100%   Physical Exam  NAD, alert Abdomen: soft, non-tender. Drain in place.  Insertion site intact.  No butterfly dressing currently being used.  Skin suture intact.  Sero-sanguinous fluid; output of significant volume.    Imaging: No results found.  Labs:  CBC: Recent Labs    02/25/19 0018 02/27/19 0635 02/28/19 0640 03/06/19 1108  WBC 8.4 4.9 5.9 8.6  HGB 8.5* 7.5* 8.2* 9.1*  HCT 27.8* 25.5* 27.8* 29.9*  PLT 524* 535* 638* 527*    COAGS: Recent Labs    02/24/19 1740 02/24/19 2322 02/27/19 0635  INR 2.4* 2.5* 1.4*  APTT 59* 51*  --     BMP: Recent Labs    02/25/19 0018 02/27/19 0635 02/28/19 0640 03/06/19 1108  NA 134* 137 138 137  K 3.6 2.8* 3.7 4.2  CL 102 102 105 103  CO2 19* 23 22 23   GLUCOSE 91 107* 116* 75  BUN 5* <5* <5* 13  CALCIUM 8.1* 8.2* 8.4* 8.9  CREATININE 0.63 0.48 0.51 0.50  GFRNONAA >60 >60 >60 119  GFRAA >60 >60 >60 138    LIVER FUNCTION TESTS: Recent Labs    02/24/19 1328 02/24/19 1328 02/25/19 0018 02/27/19 0635 02/28/19 0640 03/06/19 1108  BILITOT 0.3   < > 0.4 0.3 <0.1* 0.3  AST 44*   < > 39 35 51* 16  ALT 33   < > 29 24 39 20  ALKPHOS 113  --  106 79 86  --   PROT 7.1   < > 6.7 5.6* 6.0* 6.1  ALBUMIN 2.3*  --  2.2* 1.7* 1.8*  --    < > = values in this interval not displayed.    Assessment: Abdominal wall seroma Patient with ongoing output from her seroma drain of 25-70 mL/day.  Her CT Imaging today reviewed by Dr. Anselm Pancoast who notes improvement in her seroma collection, however given continued significant output, the drain will need to remain in place for at least an additional 2 weeks.  Patient to return to IR for repeat imaging and reassessment of her drain in 2 weeks.  She is continue current care and management as well as drain output log.  She verbalizes understanding of the care plan.   Dr. Anselm Pancoast also  notes incidental findings of the lung bases which may warrant further imaging.  The patient has been made aware of these findings.  Her PCP is contacted regarding ordering further imaging.  Awaiting return call.   Signed: Docia Barrier, PA 03/15/2019, 9:51 AM   Please refer to Dr. Anselm Pancoast attestation of this note for  management and plan.

## 2019-03-16 ENCOUNTER — Other Ambulatory Visit: Payer: Self-pay | Admitting: General Surgery

## 2019-03-16 ENCOUNTER — Other Ambulatory Visit: Payer: Self-pay | Admitting: Family Medicine

## 2019-03-16 DIAGNOSIS — R918 Other nonspecific abnormal finding of lung field: Secondary | ICD-10-CM

## 2019-03-16 DIAGNOSIS — S301XXA Contusion of abdominal wall, initial encounter: Secondary | ICD-10-CM

## 2019-03-17 ENCOUNTER — Ambulatory Visit
Admission: RE | Admit: 2019-03-17 | Discharge: 2019-03-17 | Disposition: A | Discharge status: Home / Self Care | Payer: Medicare Other | Source: Ambulatory Visit | Attending: Family Medicine | Admitting: Family Medicine

## 2019-03-17 DIAGNOSIS — D508 Other iron deficiency anemias: Secondary | ICD-10-CM | POA: Diagnosis not present

## 2019-03-17 DIAGNOSIS — K912 Postsurgical malabsorption, not elsewhere classified: Secondary | ICD-10-CM | POA: Diagnosis not present

## 2019-03-17 DIAGNOSIS — R918 Other nonspecific abnormal finding of lung field: Secondary | ICD-10-CM

## 2019-03-17 DIAGNOSIS — R590 Localized enlarged lymph nodes: Secondary | ICD-10-CM | POA: Diagnosis not present

## 2019-03-17 DIAGNOSIS — E559 Vitamin D deficiency, unspecified: Secondary | ICD-10-CM | POA: Diagnosis not present

## 2019-03-17 DIAGNOSIS — F319 Bipolar disorder, unspecified: Secondary | ICD-10-CM | POA: Diagnosis not present

## 2019-03-17 DIAGNOSIS — E509 Vitamin A deficiency, unspecified: Secondary | ICD-10-CM | POA: Diagnosis not present

## 2019-03-17 DIAGNOSIS — Z9884 Bariatric surgery status: Secondary | ICD-10-CM | POA: Diagnosis not present

## 2019-03-17 DIAGNOSIS — Z8673 Personal history of transient ischemic attack (TIA), and cerebral infarction without residual deficits: Secondary | ICD-10-CM | POA: Diagnosis not present

## 2019-03-17 MED ORDER — IOPAMIDOL (ISOVUE-300) INJECTION 61%
75.0000 mL | Freq: Once | INTRAVENOUS | Status: AC | PRN
  Administered 2019-03-17: 09:00:00 75 mL via INTRAVENOUS

## 2019-03-20 ENCOUNTER — Ambulatory Visit (INDEPENDENT_AMBULATORY_CARE_PROVIDER_SITE_OTHER): Payer: Medicare Other | Admitting: Family Medicine

## 2019-03-20 ENCOUNTER — Encounter: Payer: Self-pay | Admitting: Family Medicine

## 2019-03-20 ENCOUNTER — Encounter: Payer: Medicare Other | Attending: General Surgery | Admitting: Skilled Nursing Facility1

## 2019-03-20 ENCOUNTER — Other Ambulatory Visit: Payer: Self-pay

## 2019-03-20 VITALS — BP 134/72 | HR 82 | Temp 97.3°F | Resp 18 | Ht 68.0 in | Wt 258.0 lb

## 2019-03-20 DIAGNOSIS — R918 Other nonspecific abnormal finding of lung field: Secondary | ICD-10-CM | POA: Diagnosis not present

## 2019-03-20 DIAGNOSIS — R590 Localized enlarged lymph nodes: Secondary | ICD-10-CM | POA: Diagnosis not present

## 2019-03-20 DIAGNOSIS — E669 Obesity, unspecified: Secondary | ICD-10-CM

## 2019-03-20 DIAGNOSIS — Z713 Dietary counseling and surveillance: Secondary | ICD-10-CM | POA: Insufficient documentation

## 2019-03-20 DIAGNOSIS — Z9884 Bariatric surgery status: Secondary | ICD-10-CM | POA: Insufficient documentation

## 2019-03-20 NOTE — Progress Notes (Signed)
Subjective:    Patient ID: [name], female    DOB: [birth year], 44 y.o.   MRN: [id number]  HPI  Patient recently had a CT scan of the abdomen and pelvis to evaluate for resolution of her intra-abdominal abscess.  There was a coincidental finding of pulmonary nodules with lymphadenopathy.  A CT scan of the chest was recommended.  The results of the CT scan of the chest are dictated below. IMPRESSION: 1. Multiple pulmonary nodules scattered throughout the lungs bilaterally, in addition to extensive mediastinal and bilateral hilar lymphadenopathy. The appearance is nonspecific, but is favored to represent a systemic disease such as sarcoidosis. Alternatively, metastatic disease or lymphoproliferative disorder is not entirely excluded. Further clinical evaluation is recommended.  Patient is here today to discuss.  She does report a history of asthma and occasionally has wheezing.  She denies any rash on her elbows or extremities.  She denies any family history of sarcoidosis. Past Medical History:  Diagnosis Date  . Anemia   . Anxiety   . Arthritis   . Asthma   . Bipolar affective ([place])   . Blood transfusion without reported diagnosis   . Colostomy in place [facility])   . CVA (cerebrovascular accident) ([place])    left cerebellar infarct found accidentally on CT (2020)  . Depression   . Diverticulitis 2008   perforated/ requiring resection  . Family history of adverse reaction to anesthesia    MGM was placed on vent after surgery  . GERD (gastroesophageal reflux disease)   . Headache   . Shortness of breath dyspnea   . Sleep apnea    mild, does not use c-pap machine  . Substance abuse ([place])    12 years ago-crack cocaine   Current Outpatient Medications on File Prior to Visit  Medication Sig Dispense Refill  . albuterol (VENTOLIN HFA) 108 (90 Base) MCG/ACT inhaler Inhale 2 puffs into the lungs every 4 (four) hours as needed for wheezing or shortness of breath. 18 g 1  .  amphetamine-dextroamphetamine (ADDERALL) 15 MG tablet Take 15 mg by mouth 2 (two) times daily.    Marland Kitchen aspirin EC 325 MG tablet Take 1 tablet (325 mg total) by mouth daily. 30 tablet 0  . atorvastatin (LIPITOR) 40 MG tablet TAKE 1 TABLET BY MOUTH EVERY DAY 90 tablet 1  . benztropine (COGENTIN) 2 MG tablet Take 2 mg by mouth at bedtime.    . clonazePAM (KLONOPIN) 1 MG tablet Take 1 mg by mouth 2 (two) times daily.     . ferrous sulfate 324 (65 Fe) MG TBEC Take 1 tablet (325 mg total) by mouth daily. 30 tablet 5  . gabapentin (NEURONTIN) 300 MG capsule Take 300 mg by mouth 3 (three) times daily.    . haloperidol (HALDOL) 5 MG tablet Take 15 mg by mouth at bedtime.     Marland Kitchen LORazepam (ATIVAN) 1 MG tablet Take 1 mg by mouth daily.    Marland Kitchen NUVIGIL 250 MG tablet Take 250 mg by mouth daily.  5  . ondansetron (ZOFRAN ODT) 4 MG disintegrating tablet Take 1 tablet (4 mg total) by mouth every 8 (eight) hours as needed for nausea or vomiting. 20 tablet 0  . QUEtiapine (SEROQUEL) 300 MG tablet Take 600 mg by mouth at bedtime.     . sodium chloride flush (NS) 0.9 % SOLN Flush drain 25ml once daily 125 mL 0  . topiramate (TOPAMAX) 100 MG tablet Take 100 mg by mouth at bedtime.     Marland Kitchen  valACYclovir (VALTREX) 500 MG tablet Take 1 tablet (500 mg total) by mouth daily as needed (fever blister). 90 tablet 2   Current Facility-Administered Medications on File Prior to Visit  Medication Dose Route Frequency Provider Last Rate Last Admin  . cyanocobalamin ((VITAMIN B-12)) injection 1,000 mcg  1,000 mcg Intramuscular Q30 days Susy Frizzle, MD   1,000 mcg at 03/06/19 1210   No Known Allergies Social History   Socioeconomic History  . Marital status: Single    Spouse name: Not on file  . Number of children: 2  . Years of education: Not on file  . Highest education level: Not on file  Occupational History  . Occupation: Insurance claims handler: UNEMPLOYED  Tobacco Use  . Smoking status: Current Every Day Smoker     Packs/day: 0.50    Years: 14.00    Pack years: 7.00    Types: E-cigarettes    Last attempt to quit: 12/20/2013    Years since quitting: 5.2  . Smokeless tobacco: Never Used  . Tobacco comment: Vapor cigarettes.  Substance and Sexual Activity  . Alcohol use: No    Alcohol/week: 0.0 standard drinks  . Drug use: No  . Sexual activity: Never    Birth control/protection: I.U.D.  Other Topics Concern  . Not on file  Social History Narrative  . Not on file   Social Determinants of Health   Financial Resource Strain:   . Difficulty of Paying Living Expenses: Not on file  Food Insecurity:   . Worried About Charity fundraiser in the Last Year: Not on file  . Ran Out of Food in the Last Year: Not on file  Transportation Needs:   . Lack of Transportation (Medical): Not on file  . Lack of Transportation (Non-Medical): Not on file  Physical Activity:   . Days of Exercise per Week: Not on file  . Minutes of Exercise per Session: Not on file  Stress:   . Feeling of Stress : Not on file  Social Connections:   . Frequency of Communication with Friends and Family: Not on file  . Frequency of Social Gatherings with Friends and Family: Not on file  . Attends Religious Services: Not on file  . Active Member of Clubs or Organizations: Not on file  . Attends Archivist Meetings: Not on file  . Marital Status: Not on file  Intimate Partner Violence:   . Fear of Current or Ex-Partner: Not on file  . Emotionally Abused: Not on file  . Physically Abused: Not on file  . Sexually Abused: Not on file     Review of Systems  All other systems reviewed and are negative.      Objective:   Physical Exam Vitals reviewed.  Constitutional:      Appearance: She is obese.  Cardiovascular:     Rate and Rhythm: Normal rate and regular rhythm.     Heart sounds: Normal heart sounds.  Pulmonary:     Effort: Pulmonary effort is normal.     Breath sounds: Normal breath sounds.   Neurological:     Mental Status: She is alert.           Assessment & Plan:  Pulmonary nodules - Plan: Ambulatory referral to Pulmonology  Mediastinal lymphadenopathy - Plan: Ambulatory referral to Pulmonology  Patient's findings are nonspecific however differential diagnosis most likely is sarcoidosis versus lymphoproliferative disorder.  Less likely to be some type of metastatic malignancy.  I  believe the easiest route for biopsy would be bronchoscopy.  Therefore I will consult pulmonary to see if they feel they can biopsy one of the mediastinal lymph nodes via bronchoscopy or whether we would need to consult CVTS.

## 2019-03-20 NOTE — Progress Notes (Signed)
Bariatric Nutrition Follow-Up Visit Medical Nutrition Therapy  Appt Start Time: 1000   End Time: 10:50  NUTRITION ASSESSMENT Anthropometrics  Start weight at [facility]: 315 lbs (date: 11/06/2013) Today's weight: 258.9 lbs Weight change: +21.9 lbs (since previous nutrition visit, 12/10/2015)  Clinical  Medical hx: bipolar, anxiety Medications: see list Labs:    Lifestyle & Dietary Hx  Pt reports gaining 3 pounds in the last 2 days. Pt reports being stressed recently. Pt reports diagnosis of swollen lymphnodes in her lungs and being very scared. Pt reports blood in stool (dark stool) and anemia. Pt reports losing 20 pounds recently, and couldn't eat any more than a little yogurt in December. Pt reports painful recovery from gastric surgery 4 years ago. Pt reports feeling like she is in good hands with her physician. Pt reports constipation due to iron supplement. Pt reports taking laxetive, and has diarrhea about 12 hours later. Pt reports seeing psychiatrist about every 3 months. Pt states she Does not like talk therapy because the turnover rate is too high.  Pt reports being worn out physically, but does not feel that anything is wrong. Pt does not report bloating. Pt reports taking adivan or kolopin if she gets shaky stating she is pretty sure it is from anxiety. Pt reports fingernails break easily.   Pt reports bowel movements every 2 days. Pt takes exlax, and reported being in the bathroom on and off all night.  Pt reports that her mother is the primary cook in the household. Reports a large number of people living with her. Pt reports feeling like she has control over what she eats, and how much she eats.  Pt reports she likes Starbucks Strawberry Lemonade, Diet Coke (Does not give her GI distress) Pt reports not wanting to continue gaining weight, needing to stop snacking on sweets and non-diet drinks.  Pt states her 43 year old son loves eating out and often asks her to get him something  to eat from fast food/resturuants.   Estimated daily fluid intake:   Estimated daily protein intake:  Supplements: Iron, B12 shot monthly, MV with Iron, Calcium, Thiamin Current average weekly physical activity: None reported  24-Hr Dietary Recall First Meal: Premier protein shake, Strawberry Iced tea Snack: Salad w cucumbers and 1,000 island dressing, catalina red dressing Second Meal: Spaghetti, jar Prego sauce, about a cup. 2 pieces Pound cake w/ vanilla pudding. Or fast food Snack: Third Meal: Cup full of spaghetti, handful of M&Ms or fast food Snack:  Beverages: Strawberry Iced Tea 16 oz, Welch's orange pineapple, Diet Coke 20 oz, Sprite 12 oz   Post-Op Goals/ Signs/ Symptoms Using straws: no Drinking while eating: no Chewing/swallowing difficulties: no Changes in vision: no Changes to mood/headaches: no Hair loss/changes to skin/nails: no Difficulty focusing/concentrating: no Sweating: no Dizziness/lightheadedness: no Palpitations: no  Carbonated/caffeinated beverages: no N/V/D/C/Gas: no Abdominal pain: no Dumping syndrome: no    NUTRITION DIAGNOSIS  Overweight/obesity ([place]-3.3) related to past poor dietary habits and physical inactivity as evidenced by completed bariatric surgery and following dietary guidelines for continued weight loss and healthy nutrition status.     NUTRITION INTERVENTION Nutrition counseling (C-1) and education (E-2) to facilitate bariatric surgery goals, including: . The importance of consuming adequate calories as well as certain nutrients daily due to the body's need for essential vitamins, minerals, and fats . The importance of daily physical activity and to reach a goal of at least 150 minutes of moderate to vigorous physical activity weekly (or as directed by  their physician) due to benefits such as increased musculature and improved lab values  Goals: Do not take a laxative they are harsh: speak with your doctor about a stool softener   Do not get anything from any fast food restaurant for one month. This includes beverages as well. Plan on bringing food from home if you will be away from the house.   Handouts Provided Include   N/A  Learning Style & Readiness for Change Teaching method utilized: Visual & Auditory  Demonstrated degree of understanding via: Teach Back  Barriers to learning/adherence to lifestyle change: Resistance to change  RD's Notes for Next Visit . Assess adherence to pt chosen goals . Revisit barriers to success    MONITORING & EVALUATION Dietary intake, weekly physical activity, body weight.  Next Steps Patient is to follow-up in 1 month.

## 2019-03-21 DIAGNOSIS — I10 Essential (primary) hypertension: Secondary | ICD-10-CM | POA: Diagnosis not present

## 2019-03-21 DIAGNOSIS — I6930 Unspecified sequelae of cerebral infarction: Secondary | ICD-10-CM | POA: Diagnosis not present

## 2019-03-21 DIAGNOSIS — E662 Morbid (severe) obesity with alveolar hypoventilation: Secondary | ICD-10-CM | POA: Diagnosis not present

## 2019-03-21 DIAGNOSIS — G4459 Other complicated headache syndrome: Secondary | ICD-10-CM | POA: Diagnosis not present

## 2019-03-22 ENCOUNTER — Telehealth: Payer: Self-pay | Admitting: Family Medicine

## 2019-03-22 NOTE — Telephone Encounter (Signed)
Patient called in stating that she had an appointment on yesterday 03/21/2019 with her Neurologist Dr. Merlene Laughter and he prescribed plavix for her to take for stroke prevention. Patient states that she is worried about taking this medications since you instructed her to hold her daily Aspirin due to anemia. Please advise?

## 2019-03-23 ENCOUNTER — Encounter: Payer: Self-pay | Admitting: Emergency Medicine

## 2019-03-23 ENCOUNTER — Telehealth: Payer: Self-pay | Admitting: Emergency Medicine

## 2019-03-23 ENCOUNTER — Other Ambulatory Visit: Payer: Self-pay

## 2019-03-23 ENCOUNTER — Ambulatory Visit (INDEPENDENT_AMBULATORY_CARE_PROVIDER_SITE_OTHER): Payer: Medicare Other | Admitting: Emergency Medicine

## 2019-03-23 ENCOUNTER — Other Ambulatory Visit: Payer: Self-pay | Admitting: Family Medicine

## 2019-03-23 VITALS — BP 124/72 | HR 82 | Temp 98.8°F | Ht 68.0 in | Wt 259.0 lb

## 2019-03-23 DIAGNOSIS — R911 Solitary pulmonary nodule: Secondary | ICD-10-CM

## 2019-03-23 DIAGNOSIS — R0602 Shortness of breath: Secondary | ICD-10-CM

## 2019-03-23 DIAGNOSIS — K921 Melena: Secondary | ICD-10-CM

## 2019-03-23 DIAGNOSIS — D508 Other iron deficiency anemias: Secondary | ICD-10-CM

## 2019-03-23 DIAGNOSIS — R9389 Abnormal findings on diagnostic imaging of other specified body structures: Secondary | ICD-10-CM

## 2019-03-23 DIAGNOSIS — R590 Localized enlarged lymph nodes: Secondary | ICD-10-CM

## 2019-03-23 DIAGNOSIS — R0600 Dyspnea, unspecified: Secondary | ICD-10-CM | POA: Insufficient documentation

## 2019-03-23 LAB — C-REACTIVE PROTEIN: CRP: 1.2 mg/dL (ref 0.5–20.0)

## 2019-03-23 NOTE — H&P (View-Only) (Signed)
Subjective:    Patient ID: [name], female    DOB: 12/03/1975, 44 y.o.   MRN: [id number]  HPI 44 year old obese woman, history of former tobacco use (16 pack years), she uses vapes now, with bipolar disease, diverticulitis with a prior perforation and resection, colostomy.  She had a subcutaneous abdominal wall abscess that required drainage and treatment. She still has a drain in place, recent output 60cc/day. Following with Dr Redmond Pulling at [place]. Most recent CT abd with improvement in her abscess / seroma. Prior CVA.   She has obstructive sleep apnea, not on CPAP.  She carries a history of asthma, has been seen by Dr. Melvyn Novas in our office for cough in 2013.  Pulmonary function testing from 11/09/2011 reviewed by me shows grossly normal airflows, no bronchodilator response, evidence for restriction based on a decreased RV, decreased diffusion capacity that corrects to the normal range when adjusted for her alveolar volume.   She has mediastinal lymphadenopathy and scattered pulmonary nodular disease originally identified by CT abdomen 2016.  CT chest 03/17/2019 reviewed by me shows multiple small pulmonary nodules scattered, solid in appearance, largest 7 mm in the right lower lobe, multiple enlarged mediastinal and bilateral hilar lymph nodes.  She hears wheezing both at rest and w exertion. Her exercise tolerance is limited. She is able to shop for groceries. Cannot walk quickly has to pace herself. She has albuterol - uses it rarely, does seem to help her wheeze.   She is usually on plavix for hx cerebellar CVA, not currently on this due to heme-positive stool. ASA and plavix both on hold.    Review of Systems Wheeze, dyspnea as above Serous drainage from her superficial abd wall drain   Past Medical History:  Diagnosis Date  . Anemia   . Anxiety   . Arthritis   . Asthma   . Bipolar affective ([place])   . Blood transfusion without reported diagnosis   . Colostomy in place [facility])   .  CVA (cerebrovascular accident) ([place])    left cerebellar infarct found accidentally on CT (2020)  . Depression   . Diverticulitis 2008   perforated/ requiring resection  . Family history of adverse reaction to anesthesia    MGM was placed on vent after surgery  . GERD (gastroesophageal reflux disease)   . Headache   . Shortness of breath dyspnea   . Sleep apnea    mild, does not use c-pap machine  . Substance abuse ([place])    12 years ago-crack cocaine     Family History  Problem Relation Age of Onset  . Asthma Maternal Grandmother   Her maternal GF had lung cancer No family hx of sarcoidosis.   Social History   Socioeconomic History  . Marital status: Single    Spouse name: Not on file  . Number of children: 2  . Years of education: Not on file  . Highest education level: Not on file  Occupational History  . Occupation: Insurance claims handler: UNEMPLOYED  Tobacco Use  . Smoking status: Current Every Day Smoker    Packs/day: 0.50    Years: 14.00    Pack years: 7.00    Types: E-cigarettes    Last attempt to quit: 12/20/2013    Years since quitting: 5.2  . Smokeless tobacco: Never Used  . Tobacco comment: Vapor cigarettes.  Substance and Sexual Activity  . Alcohol use: No    Alcohol/week: 0.0 standard drinks  . Drug use: No  .  Sexual activity: Never    Birth control/protection: I.U.D.  Other Topics Concern  . Not on file  Social History Narrative  . Not on file   Social Determinants of Health   Financial Resource Strain:   . Difficulty of Paying Living Expenses: Not on file  Food Insecurity:   . Worried About Charity fundraiser in the Last Year: Not on file  . Ran Out of Food in the Last Year: Not on file  Transportation Needs:   . Lack of Transportation (Medical): Not on file  . Lack of Transportation (Non-Medical): Not on file  Physical Activity:   . Days of Exercise per Week: Not on file  . Minutes of Exercise per Session: Not on file  Stress:     . Feeling of Stress : Not on file  Social Connections:   . Frequency of Communication with Friends and Family: Not on file  . Frequency of Social Gatherings with Friends and Family: Not on file  . Attends Religious Services: Not on file  . Active Member of Clubs or Organizations: Not on file  . Attends Archivist Meetings: Not on file  . Marital Status: Not on file  Intimate Partner Violence:   . Fear of Current or Ex-Partner: Not on file  . Emotionally Abused: Not on file  . Physically Abused: Not on file  . Sexually Abused: Not on file     No Known Allergies   Outpatient Medications Prior to Visit  Medication Sig Dispense Refill  . albuterol (VENTOLIN HFA) 108 (90 Base) MCG/ACT inhaler Inhale 2 puffs into the lungs every 4 (four) hours as needed for wheezing or shortness of breath. 18 g 1  . amphetamine-dextroamphetamine (ADDERALL) 15 MG tablet Take 15 mg by mouth 2 (two) times daily.    Marland Kitchen aspirin EC 325 MG tablet Take 1 tablet (325 mg total) by mouth daily. 30 tablet 0  . atenolol (TENORMIN) 25 MG tablet 50 mg.    . atorvastatin (LIPITOR) 40 MG tablet TAKE 1 TABLET BY MOUTH EVERY DAY 90 tablet 1  . benztropine (COGENTIN) 2 MG tablet Take 2 mg by mouth at bedtime.    . clonazePAM (KLONOPIN) 1 MG tablet Take 1 mg by mouth 2 (two) times daily.     . ferrous sulfate 324 (65 Fe) MG TBEC Take 1 tablet (325 mg total) by mouth daily. 30 tablet 5  . gabapentin (NEURONTIN) 300 MG capsule Take 300 mg by mouth 3 (three) times daily.    . haloperidol (HALDOL) 5 MG tablet Take 15 mg by mouth at bedtime.     Marland Kitchen LORazepam (ATIVAN) 1 MG tablet Take 1 mg by mouth daily.    Marland Kitchen NUVIGIL 250 MG tablet Take 250 mg by mouth daily.  5  . ondansetron (ZOFRAN ODT) 4 MG disintegrating tablet Take 1 tablet (4 mg total) by mouth every 8 (eight) hours as needed for nausea or vomiting. 20 tablet 0  . QUEtiapine (SEROQUEL) 300 MG tablet Take 600 mg by mouth at bedtime.     . sodium chloride flush (NS)  0.9 % SOLN Flush drain 52ml once daily 125 mL 0  . topiramate (TOPAMAX) 100 MG tablet Take 100 mg by mouth at bedtime.     . valACYclovir (VALTREX) 500 MG tablet Take 1 tablet (500 mg total) by mouth daily as needed (fever blister). 90 tablet 2   Facility-Administered Medications Prior to Visit  Medication Dose Route Frequency Provider Last Rate Last Admin  .  cyanocobalamin ((VITAMIN B-12)) injection 1,000 mcg  1,000 mcg Intramuscular Q30 days Susy Frizzle, MD   1,000 mcg at 03/06/19 1210        Objective:   Physical Exam  Vitals:   03/23/19 0914  BP: 124/72  Pulse: 82  Temp: 98.8 F (37.1 C)  TempSrc: Temporal  SpO2: 96%  Weight: 259 lb (117.5 kg)  Height: 5\' 8"  (1.727 m)   Gen: Pleasant, obese woman, in no distress, slightly distracted but redirectable   ENT: No lesions,  mouth clear,  oropharynx clear, narrow post pharynx, no postnasal drip  Neck: No JVD, intermittent exp soft stridor  Lungs: No use of accessory muscles, no crackles or wheezing on normal respiration, no wheeze on forced expiration  Cardiovascular: RRR, heart sounds normal, no murmur or gallops, no peripheral edema  Abd: Drain in RLQ area, some crust at site, no erythema or purulence noted  Musculoskeletal: No deformities, no cyanosis or clubbing  Neuro: alert, awake, non focal  Skin: Warm, no lesions or rash      Assessment & Plan:  Abnormal CT of the chest Bilateral small scattered pulmonary nodules, slightly enlarged compared with 2016.  Bilateral mediastinal and hilar lymphadenopathy.  Appearance is most consistent with sarcoidosis or granulomatous process.  Indolent lymphoma or malignancy are possibilities.  I discussed the possible diagnoses with her today.  I have recommended bronchoscopy with endobronchial ultrasound and nodal biopsies, cultures to evaluate further.  She understands and agrees.  We will need to keep her off her Plavix and aspirin until after this is done.  It was stopped  for her abdominal procedures.  I will check ACE level, ANCA, CRP, RF, ANA today.   Dyspnea Based on slow progression in her description it sounds like there is a component of deconditioning here.  She does have wheeze and what she describes as upper airway noise, stridor.  This could be contributors as well.  Certainly if she has a diagnosis of sarcoidosis she could have associated obstructive lung disease.  She needs repeat pulmonary function testing and we will arrange for these.  For now she has albuterol and will keep it available to use if needed.  Baltazar Apo, MD, PhD 03/23/2019, 10:18 AM [facility] [phone] or if no answer [phone]

## 2019-03-23 NOTE — Telephone Encounter (Signed)
Called pt and advised message from the provider. Pt understood and verbalized understanding. Nothing further is needed.    was stopped for her abdominal procedures.  I will check ACE level, ANCA, CRP, RF, ANA today.     Patient Instructions by Collene Gobble, MD at 03/23/2019 9:30 AM Author: Collene Gobble, MD Author Type: Physician Filed: 03/23/2019 9:44 AM  Note Status: Signed Cosign: Cosign Not Required Encounter Date: 03/23/2019  Editor: Collene Gobble, MD (Physician)    We will perform lab work today We will work on setting up a bronchoscopy with lymph node biopsies in February.  We will contact you with more information about possible dates and times. Please stay off your aspirin and Plavix for now.  We can talk about restarting after your procedures have been completed You need repeat pulmonary function testing.  We will arrange for this at your next office visit Keep your albuterol available use 2 puffs if needed for shortness of breath, chest tightness, wheezing. Follow with Dr Lamonte Sakai in 1 month or next available with PFT on the same day.

## 2019-03-23 NOTE — Assessment & Plan Note (Signed)
Based on slow progression in her description it sounds like there is a component of deconditioning here.  She does have wheeze and what she describes as upper airway noise, stridor.  This could be contributors as well.  Certainly if she has a diagnosis of sarcoidosis she could have associated obstructive lung disease.  She needs repeat pulmonary function testing and we will arrange for these.  For now she has albuterol and will keep it available to use if needed.

## 2019-03-23 NOTE — Telephone Encounter (Signed)
I would hold plavix given the fact she is anemic and blood is in her stool.  She needs to see GI to determine the source of the blood in the stool.  Once that has been identified and treated, then she could start her plavix and replace her aspirin.

## 2019-03-23 NOTE — Telephone Encounter (Signed)
Spoke with patient and informed her of recommendations by Dr. Dennard Schaumann.

## 2019-03-23 NOTE — Progress Notes (Signed)
Subjective:    Patient ID: [name], female    DOB: 16-Sep-1975, 44 y.o.   MRN: [id number]  HPI 44 year old obese woman, history of former tobacco use (16 pack years), she uses vapes now, with bipolar disease, diverticulitis with a prior perforation and resection, colostomy.  She had a subcutaneous abdominal wall abscess that required drainage and treatment. She still has a drain in place, recent output 60cc/day. Following with Dr Redmond Pulling at [facility]. Most recent CT abd with improvement in her abscess / seroma. Prior CVA.   She has obstructive sleep apnea, not on CPAP.  She carries a history of asthma, has been seen by Dr. Melvyn Novas in our office for cough in 2013.  Pulmonary function testing from 11/09/2011 reviewed by me shows grossly normal airflows, no bronchodilator response, evidence for restriction based on a decreased RV, decreased diffusion capacity that corrects to the normal range when adjusted for her alveolar volume.   She has mediastinal lymphadenopathy and scattered pulmonary nodular disease originally identified by CT abdomen 2016.  CT chest 03/17/2019 reviewed by me shows multiple small pulmonary nodules scattered, solid in appearance, largest 7 mm in the right lower lobe, multiple enlarged mediastinal and bilateral hilar lymph nodes.  She hears wheezing both at rest and w exertion. Her exercise tolerance is limited. She is able to shop for groceries. Cannot walk quickly has to pace herself. She has albuterol - uses it rarely, does seem to help her wheeze.   She is usually on plavix for hx cerebellar CVA, not currently on this due to heme-positive stool. ASA and plavix both on hold.    Review of Systems Wheeze, dyspnea as above Serous drainage from her superficial abd wall drain   Past Medical History:  Diagnosis Date  . Anemia   . Anxiety   . Arthritis   . Asthma   . Bipolar affective ([place])   . Blood transfusion without reported diagnosis   . Colostomy in place [facility])   .  CVA (cerebrovascular accident) ([place])    left cerebellar infarct found accidentally on CT (2020)  . Depression   . Diverticulitis 2008   perforated/ requiring resection  . Family history of adverse reaction to anesthesia    MGM was placed on vent after surgery  . GERD (gastroesophageal reflux disease)   . Headache   . Shortness of breath dyspnea   . Sleep apnea    mild, does not use c-pap machine  . Substance abuse ([place])    12 years ago-crack cocaine     Family History  Problem Relation Age of Onset  . Asthma Maternal Grandmother   Her maternal GF had lung cancer No family hx of sarcoidosis.   Social History   Socioeconomic History  . Marital status: Single    Spouse name: Not on file  . Number of children: 2  . Years of education: Not on file  . Highest education level: Not on file  Occupational History  . Occupation: Insurance claims handler: UNEMPLOYED  Tobacco Use  . Smoking status: Current Every Day Smoker    Packs/day: 0.50    Years: 14.00    Pack years: 7.00    Types: E-cigarettes    Last attempt to quit: 12/20/2013    Years since quitting: 5.2  . Smokeless tobacco: Never Used  . Tobacco comment: Vapor cigarettes.  Substance and Sexual Activity  . Alcohol use: No    Alcohol/week: 0.0 standard drinks  . Drug use: No  .  Sexual activity: Never    Birth control/protection: I.U.D.  Other Topics Concern  . Not on file  Social History Narrative  . Not on file   Social Determinants of Health   Financial Resource Strain:   . Difficulty of Paying Living Expenses: Not on file  Food Insecurity:   . Worried About Charity fundraiser in the Last Year: Not on file  . Ran Out of Food in the Last Year: Not on file  Transportation Needs:   . Lack of Transportation (Medical): Not on file  . Lack of Transportation (Non-Medical): Not on file  Physical Activity:   . Days of Exercise per Week: Not on file  . Minutes of Exercise per Session: Not on file  Stress:     . Feeling of Stress : Not on file  Social Connections:   . Frequency of Communication with Friends and Family: Not on file  . Frequency of Social Gatherings with Friends and Family: Not on file  . Attends Religious Services: Not on file  . Active Member of Clubs or Organizations: Not on file  . Attends Archivist Meetings: Not on file  . Marital Status: Not on file  Intimate Partner Violence:   . Fear of Current or Ex-Partner: Not on file  . Emotionally Abused: Not on file  . Physically Abused: Not on file  . Sexually Abused: Not on file     No Known Allergies   Outpatient Medications Prior to Visit  Medication Sig Dispense Refill  . albuterol (VENTOLIN HFA) 108 (90 Base) MCG/ACT inhaler Inhale 2 puffs into the lungs every 4 (four) hours as needed for wheezing or shortness of breath. 18 g 1  . amphetamine-dextroamphetamine (ADDERALL) 15 MG tablet Take 15 mg by mouth 2 (two) times daily.    Marland Kitchen aspirin EC 325 MG tablet Take 1 tablet (325 mg total) by mouth daily. 30 tablet 0  . atenolol (TENORMIN) 25 MG tablet 50 mg.    . atorvastatin (LIPITOR) 40 MG tablet TAKE 1 TABLET BY MOUTH EVERY DAY 90 tablet 1  . benztropine (COGENTIN) 2 MG tablet Take 2 mg by mouth at bedtime.    . clonazePAM (KLONOPIN) 1 MG tablet Take 1 mg by mouth 2 (two) times daily.     . ferrous sulfate 324 (65 Fe) MG TBEC Take 1 tablet (325 mg total) by mouth daily. 30 tablet 5  . gabapentin (NEURONTIN) 300 MG capsule Take 300 mg by mouth 3 (three) times daily.    . haloperidol (HALDOL) 5 MG tablet Take 15 mg by mouth at bedtime.     Marland Kitchen LORazepam (ATIVAN) 1 MG tablet Take 1 mg by mouth daily.    Marland Kitchen NUVIGIL 250 MG tablet Take 250 mg by mouth daily.  5  . ondansetron (ZOFRAN ODT) 4 MG disintegrating tablet Take 1 tablet (4 mg total) by mouth every 8 (eight) hours as needed for nausea or vomiting. 20 tablet 0  . QUEtiapine (SEROQUEL) 300 MG tablet Take 600 mg by mouth at bedtime.     . sodium chloride flush (NS)  0.9 % SOLN Flush drain 6ml once daily 125 mL 0  . topiramate (TOPAMAX) 100 MG tablet Take 100 mg by mouth at bedtime.     . valACYclovir (VALTREX) 500 MG tablet Take 1 tablet (500 mg total) by mouth daily as needed (fever blister). 90 tablet 2   Facility-Administered Medications Prior to Visit  Medication Dose Route Frequency Provider Last Rate Last Admin  .  cyanocobalamin ((VITAMIN B-12)) injection 1,000 mcg  1,000 mcg Intramuscular Q30 days Susy Frizzle, MD   1,000 mcg at 03/06/19 1210        Objective:   Physical Exam  Vitals:   03/23/19 0914  BP: 124/72  Pulse: 82  Temp: 98.8 F (37.1 C)  TempSrc: Temporal  SpO2: 96%  Weight: 259 lb (117.5 kg)  Height: 5\' 8"  (1.727 m)   Gen: Pleasant, obese woman, in no distress, slightly distracted but redirectable   ENT: No lesions,  mouth clear,  oropharynx clear, narrow post pharynx, no postnasal drip  Neck: No JVD, intermittent exp soft stridor  Lungs: No use of accessory muscles, no crackles or wheezing on normal respiration, no wheeze on forced expiration  Cardiovascular: RRR, heart sounds normal, no murmur or gallops, no peripheral edema  Abd: Drain in RLQ area, some crust at site, no erythema or purulence noted  Musculoskeletal: No deformities, no cyanosis or clubbing  Neuro: alert, awake, non focal  Skin: Warm, no lesions or rash      Assessment & Plan:  Abnormal CT of the chest Bilateral small scattered pulmonary nodules, slightly enlarged compared with 2016.  Bilateral mediastinal and hilar lymphadenopathy.  Appearance is most consistent with sarcoidosis or granulomatous process.  Indolent lymphoma or malignancy are possibilities.  I discussed the possible diagnoses with her today.  I have recommended bronchoscopy with endobronchial ultrasound and nodal biopsies, cultures to evaluate further.  She understands and agrees.  We will need to keep her off her Plavix and aspirin until after this is done.  It was stopped  for her abdominal procedures.  I will check ACE level, ANCA, CRP, RF, ANA today.   Dyspnea Based on slow progression in her description it sounds like there is a component of deconditioning here.  She does have wheeze and what she describes as upper airway noise, stridor.  This could be contributors as well.  Certainly if she has a diagnosis of sarcoidosis she could have associated obstructive lung disease.  She needs repeat pulmonary function testing and we will arrange for these.  For now she has albuterol and will keep it available to use if needed.  Baltazar Apo, MD, PhD 03/23/2019, 10:18 AM [facility] [phone] or if no answer [phone]

## 2019-03-23 NOTE — Assessment & Plan Note (Signed)
Bilateral small scattered pulmonary nodules, slightly enlarged compared with 2016.  Bilateral mediastinal and hilar lymphadenopathy.  Appearance is most consistent with sarcoidosis or granulomatous process.  Indolent lymphoma or malignancy are possibilities.  I discussed the possible diagnoses with her today.  I have recommended bronchoscopy with endobronchial ultrasound and nodal biopsies, cultures to evaluate further.  She understands and agrees.  We will need to keep her off her Plavix and aspirin until after this is done.  It was stopped for her abdominal procedures.  I will check ACE level, ANCA, CRP, RF, ANA today.

## 2019-03-23 NOTE — Patient Instructions (Signed)
We will perform lab work today We will work on setting up a bronchoscopy with lymph node biopsies in February.  We will contact you with more information about possible dates and times. Please stay off your aspirin and Plavix for now.  We can talk about restarting after your procedures have been completed You need repeat pulmonary function testing.  We will arrange for this at your next office visit Keep your albuterol available use 2 puffs if needed for shortness of breath, chest tightness, wheezing. Follow with Dr Lamonte Sakai in 1 month or next available with PFT on the same day.

## 2019-03-24 ENCOUNTER — Other Ambulatory Visit: Payer: Self-pay | Admitting: General Surgery

## 2019-03-24 DIAGNOSIS — R918 Other nonspecific abnormal finding of lung field: Secondary | ICD-10-CM

## 2019-03-24 LAB — RHEUMATOID FACTOR: Rheumatoid fact SerPl-aCnc: <14 IU/mL (ref <14)

## 2019-03-24 LAB — ANA: Anti Nuclear Antibody (ANA): NEGATIVE

## 2019-03-24 LAB — ANGIOTENSIN CONVERTING ENZYME: Angiotensin-Converting Enzyme: 72 U/L — ABNORMAL HIGH (ref 9–67)

## 2019-03-24 LAB — ANCA SCREEN W REFLEX TITER: ANCA Screen: NEGATIVE

## 2019-03-27 ENCOUNTER — Telehealth: Payer: Self-pay | Admitting: Emergency Medicine

## 2019-03-27 NOTE — Telephone Encounter (Signed)
ATC pt, line was picked up and promptly hung up. atc back, line rang to fast busy signal. Wcb.

## 2019-03-28 ENCOUNTER — Telehealth: Payer: Self-pay | Admitting: General Surgery

## 2019-03-28 ENCOUNTER — Encounter: Payer: Self-pay | Admitting: Emergency Medicine

## 2019-03-28 NOTE — Telephone Encounter (Signed)
ATC pt, received fast busy signal x2.

## 2019-03-28 NOTE — Telephone Encounter (Signed)
Per [place], pt should arrive between 1 & 1 1/2 hrs prior to procedure.  Advised pt.

## 2019-03-28 NOTE — Telephone Encounter (Signed)
The patient called into clinic stating she was scheduled for covid screen on 04/08/19, and the bronchoscopy on 04/11/19.   However, she was not given the time she is supposed to show up for the bronchoscopy. Can someone please let her know what the process is? Or if pre-op will be contacting her. Thank you much as always.

## 2019-03-29 NOTE — Telephone Encounter (Signed)
Called and spoke to pt. Pt questioning what time her EBUS is scheduled for. Per pt's chart she is scheduled for 04/11/19 at 0730. Pt verbalized understanding and denied any further questions or concerns at this time.

## 2019-03-30 ENCOUNTER — Other Ambulatory Visit: Payer: Medicare Other

## 2019-04-05 ENCOUNTER — Other Ambulatory Visit: Payer: Self-pay | Admitting: General Surgery

## 2019-04-05 ENCOUNTER — Telehealth: Payer: Self-pay | Admitting: Emergency Medicine

## 2019-04-05 DIAGNOSIS — S301XXD Contusion of abdominal wall, subsequent encounter: Secondary | ICD-10-CM

## 2019-04-05 NOTE — Telephone Encounter (Signed)
Spoke with pt. She had a lot of questions about her bronch she is scheduled for on 04/11/19. I answered her questions to the best of my ability. Nothing further was needed.

## 2019-04-06 ENCOUNTER — Other Ambulatory Visit: Payer: Medicare Other

## 2019-04-06 ENCOUNTER — Encounter: Payer: Self-pay | Admitting: *Deleted

## 2019-04-06 ENCOUNTER — Encounter: Payer: Self-pay | Admitting: Family Medicine

## 2019-04-06 ENCOUNTER — Other Ambulatory Visit: Payer: Self-pay

## 2019-04-06 ENCOUNTER — Ambulatory Visit (INDEPENDENT_AMBULATORY_CARE_PROVIDER_SITE_OTHER): Payer: Medicare Other | Admitting: *Deleted

## 2019-04-06 ENCOUNTER — Ambulatory Visit
Admission: RE | Admit: 2019-04-06 | Discharge: 2019-04-06 | Disposition: A | Discharge status: Home / Self Care | Payer: Medicare Other | Source: Ambulatory Visit | Attending: General Surgery | Admitting: General Surgery

## 2019-04-06 DIAGNOSIS — E538 Deficiency of other specified B group vitamins: Secondary | ICD-10-CM | POA: Diagnosis not present

## 2019-04-06 DIAGNOSIS — D508 Other iron deficiency anemias: Secondary | ICD-10-CM

## 2019-04-06 DIAGNOSIS — S301XXD Contusion of abdominal wall, subsequent encounter: Secondary | ICD-10-CM

## 2019-04-06 DIAGNOSIS — L02211 Cutaneous abscess of abdominal wall: Secondary | ICD-10-CM | POA: Diagnosis not present

## 2019-04-06 HISTORY — PX: IR RADIOLOGIST EVAL & MGMT: IMG5224

## 2019-04-06 LAB — CBC WITH DIFFERENTIAL/PLATELET
Absolute Monocytes: 663 cells/uL (ref 200–950)
Basophils Absolute: 13 cells/uL (ref 0–200)
Basophils Relative: 0.2 %
Eosinophils Absolute: 325 cells/uL (ref 15–500)
Eosinophils Relative: 5 %
HCT: 34.9 % — ABNORMAL LOW (ref 35.0–45.0)
Hemoglobin: 11.2 g/dL — ABNORMAL LOW (ref 11.7–15.5)
Lymphs Abs: 1313 cells/uL (ref 850–3900)
MCH: 27.4 pg (ref 27.0–33.0)
MCHC: 32.1 g/dL (ref 32.0–36.0)
MCV: 85.3 fL (ref 80.0–100.0)
MPV: 9.4 fL (ref 7.5–12.5)
Monocytes Relative: 10.2 %
Neutro Abs: 4186 cells/uL (ref 1500–7800)
Neutrophils Relative %: 64.4 %
Platelets: 295 10*3/uL (ref 140–400)
RBC: 4.09 10*6/uL (ref 3.80–5.10)
RDW: 17.8 % — ABNORMAL HIGH (ref 11.0–15.0)
Total Lymphocyte: 20.2 %
WBC: 6.5 10*3/uL (ref 3.8–10.8)

## 2019-04-06 NOTE — Progress Notes (Signed)
Referring Physician(s): Wilson,Eric  Chief Complaint: The patient is seen in follow up today s/p image guided drainage of recurrent infected abdominal wall seroma 02/25/19  History of present illness: [name] is a 44 year old female with past medical history of anxiety, bipolar disorder, asthma, CVA, diverticulitis s/p bowel resection now with recurrent abdominal wall seroma s/p drain placement 02/25/19 with 2.4 liters removed. Culture on the fluid removed returned positive for staph aureus and she has completed a course of Keflex since discharge from the hospital.  She has a history of gastric sleeve surgery which was complicated by development of a recurrent abdominal wall abscess /seroma and initially underwent drain placement on 05/04/2014 which was subsequently removed only to recur requiring additional drain placement on 07/27/2014.  She ultimately underwent operative debridement, however returned to the hospital with recurrent abdominal pain and fever and subsequently underwent another drain placement on 02/25/2019 as stated above.  Previous follow-up CT abdomen and pelvis on 03/15/2019 revealed that abdominal abscess was decompressed and nearly resolved but there was a small residual cavity remaining.  She presents again today for follow-up drain evaluation and states that her drain stitch is no longer attached to skin.  She continues to average about 25 to 50 cc/day of serosanguineous fluid from drain.  Her drain is attached to JP bulb.  She states that she flushes the drain about every 1 to 2 weeks.  She denies fever, headache, chest pain, worsening abdominal pain, nausea, vomiting or bleeding.  She is eating without difficulty.  She does have some chronic dyspnea and occasional cough. Recent  CT chest has revealed multiple pulmonary nodules throughout both lungs in addition to extensive mediastinal and bilateral hilar lymphadenopathy with findings concerning for sarcoidosis or potentially  metastatic disease versus lymphoproliferative disorder.  She is scheduled for bronchoscopy with endobronchial ultrasound and nodal biopsy by Dr. Lamonte Sakai on 04/11/19.   Past Medical History:  Diagnosis Date  . Anemia   . Anxiety   . Arthritis   . Asthma   . Bipolar affective ([place])   . Blood transfusion without reported diagnosis   . Colostomy in place [facility])   . CVA (cerebrovascular accident) ([place])    left cerebellar infarct found accidentally on CT (2020)  . Depression   . Diverticulitis 2008   perforated/ requiring resection  . Family history of adverse reaction to anesthesia    MGM was placed on vent after surgery  . GERD (gastroesophageal reflux disease)   . Headache   . Shortness of breath dyspnea   . Sleep apnea    mild, does not use c-pap machine  . Substance abuse ([place])    12 years ago-crack cocaine    Past Surgical History:  Procedure Laterality Date  . Abdominal wall hernia  07/2007   open repair with lysis of adhesions  . COLONOSCOPY  03/13/2010   [id number] hemorrhoids likely cause of hematochezia, otherwise normal  . Colostomy reversal  11/2006  . ESOPHAGOGASTRODUODENOSCOPY  03/13/2010   [id number] esophagus, status post passage of a Maloney dilator/Small hiatal hernia/ Antral erosions, status post biopsy  . EXAMINATION UNDER ANESTHESIA N/A 05/11/2012   Procedure: EXAM UNDER ANESTHESIA;  Surgeon: Donato Heinz, MD;  Location: [facility];  Service: General;  Laterality: N/A;  . Exploratory laparotomy with resection  2008  . FINGER CLOSED REDUCTION Right 08/05/2012   Procedure: CLOSED REDUCTION RIGHT THUMB (FINGER);  Surgeon: Linna Hoff, MD;  Location: [facility];  Service: Orthopedics;  Laterality: Right;  .  HERNIA REPAIR    . Hx of abd wall seroma  08/2007   Drained via [place] in [place], [place]  . Hx of abd wall seroma  10/2007   Drained by Dr. Geroge Baseman in office  . IR RADIOLOGIST EVAL & MGMT  03/15/2019  . Kidney stones    . LAPAROSCOPIC GASTRIC SLEEVE RESECTION  N/A 02/13/2014   Procedure: LAPAROSCOPIC GASTRIC SLEEVE RESECTION LYSIS OF ADHESIONS, UPPER ENDOSCOPY;  Surgeon: [name], MD;  Location: [facility];  Service: General;  Laterality: N/A;  . LAPAROSCOPY N/A 09/28/2014   Procedure: LAPAROSCOPY DIAGNOSTIC, INCISION AND DRAINAGE WITH LAPAROSCOPIC EXPLORATION OF ABDOMINAL WALL SEROMA with ultrasound;  Surgeon: Greer Pickerel, MD;  Location: [facility];  Service: General;  Laterality: N/A;  . PLACEMENT OF SETON N/A 05/11/2012   Procedure: PLACEMENT OF SETON;  Surgeon: Donato Heinz, MD;  Location: [facility];  Service: General;  Laterality: N/A;  . root canal 7-16    . TONSILLECTOMY    . TREATMENT FISTULA ANAL      Allergies: Patient has no known allergies.  Medications: Prior to Admission medications   Medication Sig Start Date End Date Taking? Authorizing Provider  albuterol (VENTOLIN HFA) 108 (90 Base) MCG/ACT inhaler Inhale 2 puffs into the lungs every 4 (four) hours as needed for wheezing or shortness of breath. 02/01/19   [place], Modena Nunnery, MD  amphetamine-dextroamphetamine (ADDERALL) 15 MG tablet Take 15 mg by mouth 2 (two) times daily.    [provider]  aspirin EC 325 MG tablet Take 1 tablet (325 mg total) by mouth daily. 09/08/18   Evalee Jefferson, PA-C  atenolol (TENORMIN) 25 MG tablet 50 mg. 03/21/19   [provider]  atorvastatin (LIPITOR) 40 MG tablet TAKE 1 TABLET BY MOUTH EVERY DAY 02/13/19   Susy Frizzle, MD  benztropine (COGENTIN) 2 MG tablet Take 2 mg by mouth at bedtime.    [provider]  clonazePAM (KLONOPIN) 1 MG tablet Take 1 mg by mouth 2 (two) times daily.     [provider]  ferrous sulfate 324 (65 Fe) MG TBEC Take 1 tablet (325 mg total) by mouth daily. 03/06/19   Susy Frizzle, MD  gabapentin (NEURONTIN) 300 MG capsule Take 300 mg by mouth 3 (three) times daily. 12/23/18   [provider]  haloperidol (HALDOL) 5 MG tablet Take 15 mg by mouth at bedtime.  11/21/14   [provider]  LORazepam (ATIVAN) 1 MG tablet Take 1 mg by mouth daily.    [provider]  NUVIGIL 250 MG tablet Take 250 mg by mouth daily. 11/14/14   [provider]  ondansetron (ZOFRAN ODT) 4 MG disintegrating tablet Take 1 tablet (4 mg total) by mouth every 8 (eight) hours as needed for nausea or vomiting. 02/06/19   Susy Frizzle, MD  QUEtiapine (SEROQUEL) 300 MG tablet Take 600 mg by mouth at bedtime.  11/21/14   [provider]  sodium chloride flush (NS) 0.9 % SOLN Flush drain 79ml once daily 02/28/19   Dessa Phi, DO  topiramate (TOPAMAX) 100 MG tablet Take 100 mg by mouth at bedtime.  12/31/18   [provider]  valACYclovir (VALTREX) 500 MG tablet Take 1 tablet (500 mg total) by mouth daily as needed (fever blister). 01/09/19   Susy Frizzle, MD     Family History  Problem Relation Age of Onset  . Asthma Maternal Grandmother     Social History   Socioeconomic History  .  Marital status: Single    Spouse name: Not on file  . Number of children: 2  . Years of education: Not on file  . Highest education level: Not on file  Occupational History  . Occupation: Insurance claims handler: UNEMPLOYED  Tobacco Use  . Smoking status: Current Every Day Smoker    Packs/day: 0.50    Years: 14.00    Pack years: 7.00    Types: E-cigarettes    Last attempt to quit: 12/20/2013    Years since quitting: 5.2  . Smokeless tobacco: Never Used  . Tobacco comment: Vapor cigarettes.  Substance and Sexual Activity  . Alcohol use: No    Alcohol/week: 0.0 standard drinks  . Drug use: No  . Sexual activity: Never    Birth control/protection: I.U.D.  Other Topics Concern  . Not on file  Social History Narrative  . Not on file   Social Determinants of Health   Financial Resource Strain:   . Difficulty of Paying Living Expenses: Not on file  Food Insecurity:   . Worried About Charity fundraiser in the Last Year: Not on file  . Ran Out of  Food in the Last Year: Not on file  Transportation Needs:   . Lack of Transportation (Medical): Not on file  . Lack of Transportation (Non-Medical): Not on file  Physical Activity:   . Days of Exercise per Week: Not on file  . Minutes of Exercise per Session: Not on file  Stress:   . Feeling of Stress : Not on file  Social Connections:   . Frequency of Communication with Friends and Family: Not on file  . Frequency of Social Gatherings with Friends and Family: Not on file  . Attends Religious Services: Not on file  . Active Member of Clubs or Organizations: Not on file  . Attends Archivist Meetings: Not on file  . Marital Status: Not on file     Vital Signs: VSS; AF   Physical Exam awake, alert.  Chest with some slightly diminished breath sounds bases.  Heart with regular rate and rhythm.  Abdomen obese, soft, right mid abdominal drain in place, approximately 25 to 30 cc of serosanguineous fluid in JP bulb, some mild erythema at drain insertion site.  Stitch around drain is no longer attached to skin surface.  Imaging: No results found.  Labs:  CBC: Recent Labs    02/25/19 0018 02/27/19 0635 02/28/19 0640 03/06/19 1108  WBC 8.4 4.9 5.9 8.6  HGB 8.5* 7.5* 8.2* 9.1*  HCT 27.8* 25.5* 27.8* 29.9*  PLT 524* 535* 638* 527*    COAGS: Recent Labs    02/24/19 1740 02/24/19 2322 02/27/19 0635  INR 2.4* 2.5* 1.4*  APTT 59* 51*  --     BMP: Recent Labs    02/25/19 0018 02/27/19 0635 02/28/19 0640 03/06/19 1108  NA 134* 137 138 137  K 3.6 2.8* 3.7 4.2  CL 102 102 105 103  CO2 19* 23 22 23   GLUCOSE 91 107* 116* 75  BUN 5* <5* <5* 13  CALCIUM 8.1* 8.2* 8.4* 8.9  CREATININE 0.63 0.48 0.51 0.50  GFRNONAA >60 >60 >60 119  GFRAA >60 >60 >60 138    LIVER FUNCTION TESTS: Recent Labs    02/24/19 1328 02/24/19 1328 02/25/19 0018 02/27/19 0635 02/28/19 0640 03/06/19 1108  BILITOT 0.3   < > 0.4 0.3 <0.1* 0.3  AST 44*   < > 39 35 51* 16  ALT  33   < >  29 24 39 20  ALKPHOS 113  --  106 79 86  --   PROT 7.1   < > 6.7 5.6* 6.0* 6.1  ALBUMIN 2.3*  --  2.2* 1.7* 1.8*  --    < > = values in this interval not displayed.    Assessment: 44 year old female with past medical history of anxiety, bipolar disorder, asthma, CVA, diverticulitis s/p bowel resection now with recurrent abdominal wall seroma s/p drain placement 02/25/19 with 2.4 liters removed. Culture on the fluid removed returned positive for staph aureus and she has completed a course of Keflex since discharge from the hospital.  She has a history of gastric sleeve surgery which was complicated by development of a recurrent abdominal wall abscess/ seroma and initially underwent drain placement on 05/04/2014 which was subsequently removed only to recur requiring additional drain placement on 07/27/2014.  She ultimately underwent operative debridement, however returned to the hospital with recurrent abdominal pain and fever and subsequently underwent another drain placement on 02/25/2019 as stated above.  Previous follow-up CT abdomen and pelvis on 03/15/2019 revealed that abdominal abscess was decompressed and nearly resolved but there was a small residual cavity remaining.  She presents again today for follow-up drain evaluation and states that her drain stitch is no longer attached to skin.  She continues to average about 25 to 50 cc/day of serosanguineous fluid from drain.  Her drain is attached to JP bulb.  She states that she flushes the drain about every 1 to 2 weeks.  She denies fever, headache, chest pain, worsening abdominal pain, nausea, vomiting or bleeding.  She is eating without difficulty.  She does have some chronic dyspnea and occasional cough. Recent  CT chest has revealed multiple pulmonary nodules throughout both lungs in addition to extensive mediastinal and bilateral hilar lymphadenopathy with findings concerning for sarcoidosis or potentially metastatic disease versus lymphoproliferative  disorder.  She is scheduled for bronchoscopy with endobronchial ultrasound and nodal biopsy by Dr. Lamonte Sakai on 04/11/19. As pt has continued significant output from abdominal drain plans are to keep drain in place and have patient follow-up with [name] for further surgical evaluation. StatLock device was attached on drain to secure it in place.  Site care instructions reviewed with patient.          Signed: D. Rowe Robert, PA-C 04/06/2019, 10:28 AM   Please refer to Dr. Deniece Portela attestation of this note for management and plan.      Patient ID: DENEE LUNDVALL, female   DOB: 12/20/1975, 44 y.o.   MRN: [id number]

## 2019-04-06 NOTE — Progress Notes (Signed)
Patient seen in office for Vitamin B 12 injection.   Tolerated IM administration well.  

## 2019-04-08 ENCOUNTER — Other Ambulatory Visit (HOSPITAL_COMMUNITY)
Admission: RE | Admit: 2019-04-08 | Discharge: 2019-04-08 | Disposition: A | Discharge status: Home / Self Care | Payer: Medicare Other | Source: Ambulatory Visit | Attending: Emergency Medicine | Admitting: Emergency Medicine

## 2019-04-08 DIAGNOSIS — Z20822 Contact with and (suspected) exposure to covid-19: Secondary | ICD-10-CM | POA: Insufficient documentation

## 2019-04-08 DIAGNOSIS — Z01812 Encounter for preprocedural laboratory examination: Secondary | ICD-10-CM | POA: Diagnosis not present

## 2019-04-08 LAB — SARS CORONAVIRUS 2 (TAT 6-24 HRS): SARS Coronavirus 2: NEGATIVE

## 2019-04-10 ENCOUNTER — Telehealth: Payer: Self-pay | Admitting: Emergency Medicine

## 2019-04-10 ENCOUNTER — Encounter (HOSPITAL_COMMUNITY): Payer: Self-pay | Admitting: Emergency Medicine

## 2019-04-10 ENCOUNTER — Other Ambulatory Visit: Payer: Self-pay

## 2019-04-10 NOTE — Telephone Encounter (Signed)
Called and spoke to pt. Informed her of the recs per RB. Pt verbalized understanding and denied any further questions or concerns at this time.   

## 2019-04-10 NOTE — Telephone Encounter (Signed)
Pt is scheduled for the video bronch tomorrow 2/16 at 7:30 at [facility].  Called and spoke with pt and provided her some info in regards to prep for scheduled procedure tomorrow 2/16. Pt wants to know if it will be okay for her to take her daily meds tomorrow prior to her coming for the bronch or if she should hold off until after to take her meds.  Pt said that she had stopped taking the aspirin and also stated that she has been prescribed plavix to begin taking once the bronch has been done.  Dr. Lamonte Sakai, please advise on any necessary info pt needs to know about for tomorrow. I told her that admitting should contact her to provide her more info as well.

## 2019-04-10 NOTE — Telephone Encounter (Signed)
Please let her know that she can take her atenolol.  She does not need to take any of her other oral medications.  Typically they get a call from anesthesia to give instructions.

## 2019-04-10 NOTE — Progress Notes (Signed)
Ms [name] denies chest pain, no shortness of breath; she has shortness of breath if she is climbing stairs. Ms [name] said that she received a call from Dr. Agustina Caroli office instructing her to only take Tenormin am. I instructed patient to use Albuterol if needed and to bring the inhaler with her.

## 2019-04-11 ENCOUNTER — Ambulatory Visit (HOSPITAL_COMMUNITY)
Admission: RE | Admit: 2019-04-11 | Discharge: 2019-04-11 | Disposition: A | Discharge status: Home / Self Care | Payer: Medicare Other | Attending: Emergency Medicine | Admitting: Emergency Medicine

## 2019-04-11 ENCOUNTER — Encounter (HOSPITAL_COMMUNITY): Payer: Self-pay | Admitting: Emergency Medicine

## 2019-04-11 ENCOUNTER — Ambulatory Visit (HOSPITAL_COMMUNITY): Payer: Medicare Other | Admitting: Certified Registered"

## 2019-04-11 ENCOUNTER — Other Ambulatory Visit: Payer: Self-pay

## 2019-04-11 ENCOUNTER — Encounter (HOSPITAL_COMMUNITY)
Admission: RE | Disposition: A | Discharge status: Home / Self Care | Payer: Self-pay | Source: Home / Self Care | Attending: Emergency Medicine

## 2019-04-11 DIAGNOSIS — Z7902 Long term (current) use of antithrombotics/antiplatelets: Secondary | ICD-10-CM | POA: Insufficient documentation

## 2019-04-11 DIAGNOSIS — F419 Anxiety disorder, unspecified: Secondary | ICD-10-CM | POA: Diagnosis not present

## 2019-04-11 DIAGNOSIS — Z825 Family history of asthma and other chronic lower respiratory diseases: Secondary | ICD-10-CM | POA: Insufficient documentation

## 2019-04-11 DIAGNOSIS — Z7982 Long term (current) use of aspirin: Secondary | ICD-10-CM | POA: Diagnosis not present

## 2019-04-11 DIAGNOSIS — R590 Localized enlarged lymph nodes: Secondary | ICD-10-CM | POA: Diagnosis not present

## 2019-04-11 DIAGNOSIS — R519 Headache, unspecified: Secondary | ICD-10-CM | POA: Insufficient documentation

## 2019-04-11 DIAGNOSIS — F319 Bipolar disorder, unspecified: Secondary | ICD-10-CM | POA: Insufficient documentation

## 2019-04-11 DIAGNOSIS — Z6841 Body Mass Index (BMI) 40.0 and over, adult: Secondary | ICD-10-CM | POA: Diagnosis not present

## 2019-04-11 DIAGNOSIS — R911 Solitary pulmonary nodule: Secondary | ICD-10-CM | POA: Diagnosis not present

## 2019-04-11 DIAGNOSIS — J45909 Unspecified asthma, uncomplicated: Secondary | ICD-10-CM | POA: Diagnosis not present

## 2019-04-11 DIAGNOSIS — Z801 Family history of malignant neoplasm of trachea, bronchus and lung: Secondary | ICD-10-CM | POA: Insufficient documentation

## 2019-04-11 DIAGNOSIS — D649 Anemia, unspecified: Secondary | ICD-10-CM | POA: Diagnosis not present

## 2019-04-11 DIAGNOSIS — E876 Hypokalemia: Secondary | ICD-10-CM | POA: Diagnosis not present

## 2019-04-11 DIAGNOSIS — Z87891 Personal history of nicotine dependence: Secondary | ICD-10-CM | POA: Insufficient documentation

## 2019-04-11 DIAGNOSIS — R9389 Abnormal findings on diagnostic imaging of other specified body structures: Secondary | ICD-10-CM | POA: Diagnosis not present

## 2019-04-11 DIAGNOSIS — Z8673 Personal history of transient ischemic attack (TIA), and cerebral infarction without residual deficits: Secondary | ICD-10-CM | POA: Diagnosis not present

## 2019-04-11 DIAGNOSIS — G4733 Obstructive sleep apnea (adult) (pediatric): Secondary | ICD-10-CM | POA: Insufficient documentation

## 2019-04-11 DIAGNOSIS — M1990 Unspecified osteoarthritis, unspecified site: Secondary | ICD-10-CM | POA: Diagnosis not present

## 2019-04-11 DIAGNOSIS — Z933 Colostomy status: Secondary | ICD-10-CM | POA: Insufficient documentation

## 2019-04-11 DIAGNOSIS — K219 Gastro-esophageal reflux disease without esophagitis: Secondary | ICD-10-CM | POA: Diagnosis not present

## 2019-04-11 DIAGNOSIS — Z79899 Other long term (current) drug therapy: Secondary | ICD-10-CM | POA: Insufficient documentation

## 2019-04-11 DIAGNOSIS — J0410 Acute tracheitis without obstruction: Secondary | ICD-10-CM | POA: Diagnosis not present

## 2019-04-11 DIAGNOSIS — F418 Other specified anxiety disorders: Secondary | ICD-10-CM | POA: Diagnosis not present

## 2019-04-11 HISTORY — PX: BRONCHIAL BRUSHINGS: SHX5108

## 2019-04-11 HISTORY — PX: LUNG BIOPSY: SHX5088

## 2019-04-11 HISTORY — DX: Schizophrenia, unspecified: F20.9

## 2019-04-11 HISTORY — DX: Personal history of urinary calculi: Z87.442

## 2019-04-11 HISTORY — PX: BRONCHIAL WASHINGS: SHX5105

## 2019-04-11 HISTORY — DX: Panic disorder (episodic paroxysmal anxiety): F41.0

## 2019-04-11 HISTORY — PX: FINE NEEDLE ASPIRATION: SHX5430

## 2019-04-11 HISTORY — DX: Personal history of other diseases of the digestive system: Z87.19

## 2019-04-11 HISTORY — PX: VIDEO BRONCHOSCOPY WITH ENDOBRONCHIAL ULTRASOUND: SHX6177

## 2019-04-11 LAB — PROTIME-INR
INR: 1 (ref 0.8–1.2)
Prothrombin Time: 13.4 seconds (ref 11.4–15.2)

## 2019-04-11 LAB — COMPREHENSIVE METABOLIC PANEL
ALT: 30 U/L (ref 0–44)
AST: 22 U/L (ref 15–41)
Albumin: 3.6 g/dL (ref 3.5–5.0)
Alkaline Phosphatase: 116 U/L (ref 38–126)
Anion gap: 11 (ref 5–15)
BUN: 5 mg/dL — ABNORMAL LOW (ref 6–20)
CO2: 24 mmol/L (ref 22–32)
Calcium: 9.1 mg/dL (ref 8.9–10.3)
Chloride: 103 mmol/L (ref 98–111)
Creatinine, Ser: 0.65 mg/dL (ref 0.44–1.00)
GFR calc Af Amer: >60 mL/min (ref >60)
GFR calc non Af Amer: >60 mL/min (ref >60)
Glucose, Bld: 90 mg/dL (ref 70–99)
Potassium: 3.3 mmol/L — ABNORMAL LOW (ref 3.5–5.1)
Sodium: 138 mmol/L (ref 135–145)
Total Bilirubin: 0.6 mg/dL (ref 0.3–1.2)
Total Protein: 6.7 g/dL (ref 6.5–8.1)

## 2019-04-11 LAB — CBC
HCT: 38.5 % (ref 36.0–46.0)
Hemoglobin: 12 g/dL (ref 12.0–15.0)
MCH: 27.5 pg (ref 26.0–34.0)
MCHC: 31.2 g/dL (ref 30.0–36.0)
MCV: 88.1 fL (ref 80.0–100.0)
Platelets: 305 10*3/uL (ref 150–400)
RBC: 4.37 MIL/uL (ref 3.87–5.11)
RDW: 16.8 % — ABNORMAL HIGH (ref 11.5–15.5)
WBC: 6.4 10*3/uL (ref 4.0–10.5)
nRBC: 0 % (ref 0.0–0.2)

## 2019-04-11 LAB — BODY FLUID CELL COUNT WITH DIFFERENTIAL
Eos, Fluid: 25 %
Lymphs, Fluid: 2 %
Monocyte-Macrophage-Serous Fluid: 3 % — ABNORMAL LOW (ref 50–90)
Neutrophil Count, Fluid: 69 % — ABNORMAL HIGH (ref 0–25)
Other Cells, Fluid: 1 %
Total Nucleated Cell Count, Fluid: 3850 cu mm — ABNORMAL HIGH (ref 0–1000)

## 2019-04-11 LAB — POCT PREGNANCY, URINE: Preg Test, Ur: NEGATIVE

## 2019-04-11 LAB — APTT: aPTT: 30 seconds (ref 24–36)

## 2019-04-11 SURGERY — BRONCHOSCOPY, WITH EBUS
Anesthesia: General

## 2019-04-11 MED ORDER — DEXAMETHASONE SODIUM PHOSPHATE 10 MG/ML IJ SOLN
INTRAMUSCULAR | Status: DC | PRN
  Administered 2019-04-11: 4 mg via INTRAVENOUS

## 2019-04-11 MED ORDER — MEPERIDINE HCL 100 MG/ML IJ SOLN: 6.2500 mg | INTRAMUSCULAR | Status: DC | PRN

## 2019-04-11 MED ORDER — ONDANSETRON HCL 4 MG/2ML IJ SOLN
INTRAMUSCULAR | Status: DC | PRN
  Administered 2019-04-11: 4 mg via INTRAVENOUS

## 2019-04-11 MED ORDER — SUGAMMADEX SODIUM 200 MG/2ML IV SOLN
INTRAVENOUS | Status: DC | PRN
  Administered 2019-04-11: 200 mg via INTRAVENOUS

## 2019-04-11 MED ORDER — PROMETHAZINE HCL 25 MG/ML IJ SOLN: 6.2500 mg | INTRAMUSCULAR | Status: DC | PRN

## 2019-04-11 MED ORDER — PROPOFOL 10 MG/ML IV BOLUS
INTRAVENOUS | Status: DC | PRN
  Administered 2019-04-11: 150 mg via INTRAVENOUS

## 2019-04-11 MED ORDER — OXYCODONE HCL 5 MG PO TABS: 5.0000 mg | ORAL_TABLET | Freq: Once | ORAL | Status: DC | PRN

## 2019-04-11 MED ORDER — LIDOCAINE HCL (PF) 1 % IJ SOLN
INTRAMUSCULAR | Status: AC
  Filled 2019-04-11: qty 30

## 2019-04-11 MED ORDER — MIDAZOLAM HCL 5 MG/5ML IJ SOLN
INTRAMUSCULAR | Status: DC | PRN
  Administered 2019-04-11: 2 mg via INTRAVENOUS

## 2019-04-11 MED ORDER — FENTANYL CITRATE (PF) 100 MCG/2ML IJ SOLN
INTRAMUSCULAR | Status: DC | PRN
  Administered 2019-04-11: 100 ug via INTRAVENOUS

## 2019-04-11 MED ORDER — LACTATED RINGERS IV SOLN: INTRAVENOUS | Status: DC | PRN

## 2019-04-11 MED ORDER — OXYCODONE HCL 5 MG/5ML PO SOLN: 5.0000 mg | Freq: Once | ORAL | Status: DC | PRN

## 2019-04-11 MED ORDER — LIDOCAINE 2% (20 MG/ML) 5 ML SYRINGE
INTRAMUSCULAR | Status: DC | PRN
  Administered 2019-04-11: 100 mg via INTRAVENOUS

## 2019-04-11 MED ORDER — ROCURONIUM BROMIDE 10 MG/ML (PF) SYRINGE
PREFILLED_SYRINGE | INTRAVENOUS | Status: DC | PRN
  Administered 2019-04-11: 100 mg via INTRAVENOUS

## 2019-04-11 MED ORDER — HYDROMORPHONE HCL 1 MG/ML IJ SOLN: 0.2500 mg | INTRAMUSCULAR | Status: DC | PRN

## 2019-04-11 NOTE — Discharge Instructions (Signed)
Flexible Bronchoscopy, Care After This sheet gives you information about how to care for yourself after your test. Your doctor may also give you more specific instructions. If you have problems or questions, contact your doctor. Follow these instructions at home: Eating and drinking  Do not eat or drink anything (not even water) for 2 hours after your test, or until your numbing medicine (local anesthetic) wears off.  When your numbness is gone and your cough and gag reflexes have come back, you may: ? Eat only soft foods. ? Slowly drink liquids.  The day after the test, go back to your normal diet. Driving  Do not drive for 24 hours if you were given a medicine to help you relax (sedative).  Do not drive or use heavy machinery while taking prescription pain medicine. General instructions   Take over-the-counter and prescription medicines only as told by your doctor.  Return to your normal activities as told. Ask what activities are safe for you.  Do not use any products that have nicotine or tobacco in them. This includes cigarettes and e-cigarettes. If you need help quitting, ask your doctor.  Keep all follow-up visits as told by your doctor. This is important. It is very important if you had a tissue sample (biopsy) taken. Get help right away if:  You have shortness of breath that gets worse.  You get light-headed.  You feel like you are going to pass out (faint).  You have chest pain.  You cough up: ? More than a little blood. ? More blood than before. Summary  Do not eat or drink anything (not even water) for 2 hours after your test, or until your numbing medicine wears off.  Do not use cigarettes. Do not use e-cigarettes.  Get help right away if you have chest pain.   Please call our office for any questions or concerns.  [phone].   This information is not intended to replace advice given to you by your health care provider. Make sure you discuss any  questions you have with your health care provider. Document Revised: 01/22/2017 Document Reviewed: 02/28/2016 Elsevier Patient Education  2020 [employer].

## 2019-04-11 NOTE — Transfer of Care (Signed)
Immediate Anesthesia Transfer of Care Note  Patient: [name]  Procedure(s) Performed: VIDEO BRONCHOSCOPY WITH ENDOBRONCHIAL ULTRASOUND (N/A ) LUNG BIOPSY BRONCHIAL WASHINGS BRONCHIAL BRUSHINGS FINE NEEDLE ASPIRATION (FNA) LINEAR  Patient Location: PACU  Anesthesia Type:General  Level of Consciousness: drowsy and patient cooperative  Airway & Oxygen Therapy: Patient Spontanous Breathing and Patient connected to nasal cannula oxygen  Post-op Assessment: Report given to RN, Post -op Vital signs reviewed and stable and Patient moving all extremities  Post vital signs: Reviewed and stable  Last Vitals:  Vitals Value Taken Time  BP    Temp    Pulse    Resp    SpO2      Last Pain:  Vitals:   04/11/19 0607  TempSrc:   PainSc: 0-No pain      Patients Stated Pain Goal: 3 ([id number])  Complications: No apparent anesthesia complications

## 2019-04-11 NOTE — Anesthesia Preprocedure Evaluation (Signed)
Anesthesia Evaluation  Patient identified by MRN, date of birth, ID band Patient awake    Reviewed: Allergy & Precautions, H&P , NPO status , Patient's Chart, lab work & pertinent test results  History of Anesthesia Complications (+) Family history of anesthesia reaction  Airway Mallampati: II  TM Distance: >3 FB Neck ROM: Full    Dental no notable dental hx.    Pulmonary asthma , sleep apnea , former smoker,    Pulmonary exam normal breath sounds clear to auscultation       Cardiovascular negative cardio ROS Normal cardiovascular exam Rhythm:Regular Rate:Normal     Neuro/Psych  Headaches, PSYCHIATRIC DISORDERS Anxiety Depression Bipolar Disorder CVA    GI/Hepatic Neg liver ROS, GERD  ,  Endo/Other  Morbid obesity  Renal/GU negative Renal ROS  negative genitourinary   Musculoskeletal negative musculoskeletal ROS (+)   Abdominal   Peds negative pediatric ROS (+)  Hematology  (+) anemia ,   Anesthesia Other Findings NPO appropriate, allergies reviewed Denies active cardiac or pulmonary symptoms, METS > 4 No recent congestive cough or symptoms of upper respiratory infection Meds -   Doesn't tolerate tylenol well, taking large dose antipsychotic medication in addition to frequent zofran, phenergan, doesn't want scopolamine patch given risk of drowsiness  METS>4, denies cardiac symptoms, NPO appropriate  Reproductive/Obstetrics negative OB ROS                             Anesthesia Physical  Anesthesia Plan  ASA: III  Anesthesia Plan: General   Post-op Pain Management:    Induction: Intravenous  PONV Risk Score and Plan: 3 and Ondansetron, Dexamethasone, Midazolam and Treatment may vary due to age or medical condition  Airway Management Planned: Oral ETT  Additional Equipment:   Intra-op Plan:   Post-operative Plan: Extubation in OR  Informed Consent: I have reviewed the  patients History and Physical, chart, labs and discussed the procedure including the risks, benefits and alternatives for the proposed anesthesia with the patient or authorized representative who has indicated his/her understanding and acceptance.     Dental advisory given  Plan Discussed with: CRNA and Anesthesiologist  Anesthesia Plan Comments:         Anesthesia Quick Evaluation

## 2019-04-11 NOTE — Anesthesia Procedure Notes (Signed)
Procedure Name: Intubation Date/Time: 04/11/2019 7:35 AM Performed by: Moshe Salisbury, CRNA Pre-anesthesia Checklist: Patient identified, Emergency Drugs available, Suction available and Patient being monitored Patient Re-evaluated:Patient Re-evaluated prior to induction Oxygen Delivery Method: Circle System Utilized Preoxygenation: Pre-oxygenation with 100% oxygen Induction Type: IV induction Ventilation: Mask ventilation without difficulty Laryngoscope Size: Mac and 4 Grade View: Grade I Tube type: Oral Number of attempts: 1 Airway Equipment and Method: Stylet Placement Confirmation: ETT inserted through vocal cords under direct vision,  positive ETCO2 and breath sounds checked- equal and bilateral Secured at: 20 cm Tube secured with: Tape Dental Injury: Teeth and Oropharynx as per pre-operative assessment

## 2019-04-11 NOTE — Op Note (Signed)
Video Bronchoscopy with Endobronchial Ultrasound Procedure Note  Date of Operation: 04/11/2019  Pre-op Diagnosis: Mediastinal lymphadenopathy  Post-op Diagnosis: Same  Surgeon: Baltazar Apo  Assistants: None  Anesthesia: General endotracheal anesthesia  Operation: Flexible video fiberoptic bronchoscopy with endobronchial ultrasound and biopsies.  Estimated Blood Loss: Minimal  Complications: None apparent  Indications and History: [name] is a 44 y.o. 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  OSA not on therapy.  She is under evaluation for mediastinal and hilar lymphadenopathy, micro pulmonary nodule disease follow-up with CT scan of the chest.  Recommendation was made to achieve a tissue diagnosis via bronchoscopy with endobronchial ultrasound and biopsies.  The risks, benefits, complications, treatment options and expected outcomes were discussed with the patient.  The possibilities of pneumothorax, pneumonia, reaction to medication, pulmonary aspiration, perforation of a viscus, bleeding, failure to diagnose a condition and creating a complication requiring transfusion or operation were discussed with the patient who freely signed the consent.    Description of Procedure: The patient was examined in the preoperative area and history and data from the preprocedure consultation were reviewed. It was deemed appropriate to proceed.  The patient was taken to [facility] endoscopy, identified as [name] and the procedure verified as Flexible Video Fiberoptic Bronchoscopy.  A Time Out was held and the above information confirmed. After being taken to the operating room general anesthesia was initiated and the patient  was orally intubated. The video fiberoptic bronchoscope was introduced via the endotracheal tube and a general inspection was performed which showed some general erythema  and edematous airways with no overt obstruction.  The mucosa was somewhat friable and sensitive to suction trauma.  There was a small raised nodular lesion in the distal trachea (approximate 11:00).  Endobronchial brushings and biopsies were performed on this abnormal area mucosa.  A right upper lobe BAL was performed with 60 cc instilled and approximately 20 cc returned.  This was sent for cytology, cell count, microbiology.  The standard scope was then withdrawn and the endobronchial ultrasound was used to identify and characterize the peritracheal, hilar and bronchial lymph nodes. Inspection showed enlargement of 4R, 7, 11 R nodes. Using real-time ultrasound guidance Wang needle biopsies were take from Station 4R, 7, 11 R nodes and were sent for cytology.  A sample from 11 R was also sent for fungal and AFB culture.  The patient tolerated the procedure well without apparent complications. There was no significant blood loss. The bronchoscope was withdrawn. Anesthesia was reversed and the patient was taken to the PACU for recovery.   Samples: 1. Wang needle biopsies from 4R node 2. Wang needle biopsies from 7 node 3. Wang needle biopsies from 11R node 4.  Distal tracheal endobronchial brushings 5.  Distal tracheal endobronchial biopsies 6.  Bronchoalveolar lavage from the right upper lobe  Plans:  The patient will be discharged from the PACU to home when recovered from anesthesia. We will review the cytology, pathology and microbiology results with the patient when they become available. Outpatient followup will be with Dr Lamonte Sakai.     Baltazar Apo, MD, PhD 04/11/2019, 9:00 AM [facility] [phone] or if no answer [phone]

## 2019-04-11 NOTE — Anesthesia Postprocedure Evaluation (Signed)
Anesthesia Post Note  Patient: TEMICA NATH  Procedure(s) Performed: VIDEO BRONCHOSCOPY WITH ENDOBRONCHIAL ULTRASOUND (N/A ) LUNG BIOPSY BRONCHIAL WASHINGS BRONCHIAL BRUSHINGS FINE NEEDLE ASPIRATION (FNA) LINEAR     Patient location during evaluation: PACU Anesthesia Type: General Level of consciousness: awake and alert Pain management: pain level controlled Vital Signs Assessment: post-procedure vital signs reviewed and stable Respiratory status: spontaneous breathing, nonlabored ventilation and respiratory function stable Cardiovascular status: blood pressure returned to baseline and stable Postop Assessment: no apparent nausea or vomiting Anesthetic complications: no    Last Vitals:  Vitals:   04/11/19 0900 04/11/19 0915  BP: (!) 101/50 (!) 98/51  Pulse: 88 92  Resp: 19 17  Temp: (!) 36.1 C 36.4 C  SpO2: 98% 96%    Last Pain:  Vitals:   04/11/19 0900  TempSrc:   PainSc: 0-No pain                 Lynda Rainwater

## 2019-04-11 NOTE — Interval H&P Note (Signed)
PCCM Interval Note  Patient presents today for further evaluation of her mediastinal lymphadenopathy and micronodular disease.  She tells me that she has had some increased cough, sore throat and wheezing since she had a Covid test in preparation for this procedure.  Otherwise she has been doing well.  She took her atenolol this morning as instructed   Vitals:   04/10/19 1757 04/11/19 0600 04/11/19 0601  BP:  134/81   Pulse:  89   Resp:  18   Temp:   98.1 F (36.7 C)  TempSrc:  Oral Oral  SpO2:  96%   Weight: 117.5 kg 117.5 kg   Height:  _0  (1.702 m)   Obese woman, no distress laying on stretcher.  Her oropharynx is clear.  M4 airway.  She has some mild inspiratory and expiratory stridor. Lungs are distant, some referred upper airway noise and possible also end expiratory wheeze.  I suspect that this is all referred Heart regular without a murmur, distant, normal rhythm No significant edema  Small bilateral scattered pulmonary nodules and bilateral mediastinal hilar lymphadenopathy.  Presentation most consistent with a granulomatous process such as sarcoidosis.  Less likely malignancy or lymphoma.  Her ACE level is slightly elevated, ANCA negative, CRP negative.  Plan: Bronchoscopy with BAL, EBUS to evaluate for mediastinal and hilar lymphadenopathy, obtain tissue for cytology.  Patient understands plan, agrees.  Risk and benefits discussed.  No barriers identified.  Baltazar Apo, MD, PhD 04/11/2019, 7:26 AM [facility] [phone] or if no answer [phone]

## 2019-04-12 ENCOUNTER — Other Ambulatory Visit: Payer: Medicare Other

## 2019-04-12 LAB — SURGICAL PATHOLOGY

## 2019-04-12 LAB — ACID FAST SMEAR (AFB, MYCOBACTERIA)
Acid Fast Smear: NEGATIVE
Acid Fast Smear: NEGATIVE

## 2019-04-12 LAB — CYTOLOGY - NON PAP

## 2019-04-13 LAB — CYTOLOGY - NON PAP

## 2019-04-13 LAB — CULTURE, RESPIRATORY W GRAM STAIN: Culture: NORMAL

## 2019-04-17 ENCOUNTER — Telehealth: Payer: Self-pay | Admitting: Emergency Medicine

## 2019-04-17 ENCOUNTER — Ambulatory Visit: Payer: Medicare Other | Admitting: Skilled Nursing Facility1

## 2019-04-17 NOTE — Telephone Encounter (Signed)
Called and spoke with Patient.  Patient requested results from her bronchoscopy 04/11/19.  Results are not available at this time. Patient aware once results are received, someone will contact her.

## 2019-04-17 NOTE — Telephone Encounter (Signed)
Call patient, no answer so left message on voicemail.  Her nodal cytologies are all consistent with granulomatous inflammation, no malignant cells seen.  Await culture data but suspect that this will be sarcoidosis.  I will try to call her again

## 2019-04-18 ENCOUNTER — Telehealth: Payer: Self-pay | Admitting: Emergency Medicine

## 2019-04-18 NOTE — Telephone Encounter (Signed)
Dr. Lamonte Sakai, please see the request below from the patient. The procedure was 04/11/19. The patient was last seen in clinic 03/23/19.  If there are any recommendations or results that the patient can be given, please advise. Thank you.

## 2019-04-19 ENCOUNTER — Ambulatory Visit (INDEPENDENT_AMBULATORY_CARE_PROVIDER_SITE_OTHER): Payer: Medicare Other | Admitting: Gastroenterology

## 2019-04-19 ENCOUNTER — Encounter: Payer: Self-pay | Admitting: Gastroenterology

## 2019-04-19 ENCOUNTER — Telehealth: Payer: Self-pay

## 2019-04-19 VITALS — BP 104/80 | HR 80 | Temp 98.5°F | Ht 67.0 in | Wt 264.0 lb

## 2019-04-19 DIAGNOSIS — R195 Other fecal abnormalities: Secondary | ICD-10-CM | POA: Diagnosis not present

## 2019-04-19 DIAGNOSIS — Z01818 Encounter for other preprocedural examination: Secondary | ICD-10-CM

## 2019-04-19 DIAGNOSIS — D509 Iron deficiency anemia, unspecified: Secondary | ICD-10-CM

## 2019-04-19 DIAGNOSIS — Z7902 Long term (current) use of antithrombotics/antiplatelets: Secondary | ICD-10-CM | POA: Diagnosis not present

## 2019-04-19 MED ORDER — NA SULFATE-K SULFATE-MG SULF 17.5-3.13-1.6 GM/177ML PO SOLN: 1.0000 | Freq: Once | ORAL | 0 refills | Status: AC

## 2019-04-19 NOTE — Patient Instructions (Signed)
If you are age 43 or older, your body mass index should be between 23-30. Your Body mass index is 41.35 kg/m. If this is out of the aforementioned range listed, please consider follow up with your Primary Care Provider.  If you are age 36 or younger, your body mass index should be between 19-25. Your Body mass index is 41.35 kg/m. If this is out of the aformentioned range listed, please consider follow up with your Primary Care Provider.   You have been scheduled for an endoscopy and colonoscopy. Please follow the written instructions given to you at your visit today. Please pick up your prep supplies at the pharmacy within the next 1-3 days. If you use inhalers (even only as needed), please bring them with you on the day of your procedure.  You will be contacted by our office prior to your procedure for directions on holding your Plavix.  If you do not hear from our office 1 week prior to your scheduled procedure, please call [phone] to discuss.   We will also be contacting your Pulmonologist for clearance to proceed with you EGD/Colonoscopy.

## 2019-04-19 NOTE — Telephone Encounter (Signed)
Appt with TP for 04/21/19 at 2:30 pm

## 2019-04-19 NOTE — Telephone Encounter (Signed)
Reviewed cytology and microbiology results with the patient by phone today.  Suspect that this is sarcoidosis.  She is been experiencing persistent cough congestion.  The cough is worse, is sometimes associated with incontinence.  Needs to be seen in the office as soon as we can arrange, either by Dora Clauss or APP to troubleshoot her cough.  Not clear to me that it is necessarily related to sarcoidosis or asthma.

## 2019-04-19 NOTE — Telephone Encounter (Signed)
   TERASA MCEUEN 06/04/1975 [id number]  Dear Dr. Lamonte Sakai:  We have scheduled the above named patient for a(n) EGD/Colonoscopy procedure. During her examination she was found to have bilateral pulmonary wheezes with decreased air movement.   Please advise if this patient is cleared from a medical stand point to proceed in our Endoscopy center for the above procedures.  Please route your response to [employer] or fax response to [phone].  Sincerely,    [facility]

## 2019-04-19 NOTE — Telephone Encounter (Signed)
Sent letter to Dr. Merlene Laughter to stop Plavix for procedure on 05/24/19.

## 2019-04-19 NOTE — Telephone Encounter (Signed)
Patient is returning phone call. Patient phone number is [phone]. °

## 2019-04-19 NOTE — Progress Notes (Signed)
History of Present Illness: This is a 44 year old female referred by Susy Frizzle, MD for the evaluation of anemia and positive FIT. Microcytic anemia with low Fe, TIBC, % sat in January. Ferritin was 212 in January and was 35 two years ago. She has a history of perforated diverticulitis in 2008 with a colostomy, Hartmann's pouch, and subsequent take down. She developed an incisional hernia with a subcutaneous abdominal wall seroma requiring surgical repair in 2016. Recently the seroma recurred and she has a percutaneous drain in place. Prior anal fistula S/P fistulotomy.  Previously followed by Dr. Gala Romney in [place] and underwent EGD in 2012 showing a small HH, antral erosions and colonoscopy in 2012 showed diverticulosis, prior anastomosis, external hemorrhoids. Recent bronchoscopy for pulmonary nodules and mediastinal and hilar adenopathy.  She has very infrequent episodes of scant amounts of bright red blood on the tissue paper when wiping.  No other gastrointestinal complaints denies weight loss, abdominal pain, constipation, diarrhea, change in stool caliber, melena, nausea, vomiting, dysphagia, reflux symptoms, chest pain.   No Known Allergies Outpatient Medications Prior to Visit  Medication Sig Dispense Refill  . albuterol (VENTOLIN HFA) 108 (90 Base) MCG/ACT inhaler Inhale 2 puffs into the lungs every 4 (four) hours as needed for wheezing or shortness of breath. 18 g 1  . amphetamine-dextroamphetamine (ADDERALL) 15 MG tablet Take 15 mg by mouth 3 (three) times daily.     Marland Kitchen atenolol (TENORMIN) 25 MG tablet 50 mg.    . atorvastatin (LIPITOR) 40 MG tablet TAKE 1 TABLET BY MOUTH EVERY DAY 90 tablet 1  . benztropine (COGENTIN) 2 MG tablet Take 2 mg by mouth at bedtime.    . calcium carbonate (OSCAL) 1500 (600 Ca) MG TABS tablet Take 600 mg of elemental calcium by mouth 2 (two) times daily with a meal.    . cholecalciferol (VITAMIN D3) 25 MCG (1000 UNIT) tablet Take 1,000 Units by mouth  daily.    . clonazePAM (KLONOPIN) 1 MG tablet Take 1 mg by mouth 2 (two) times daily. Takes only at night    . ferrous sulfate 325 (65 FE) MG tablet Take 325 mg by mouth daily with breakfast.    . gabapentin (NEURONTIN) 300 MG capsule Take 300 mg by mouth 3 (three) times daily.    . haloperidol (HALDOL) 5 MG tablet Take 10 mg by mouth at bedtime.     Marland Kitchen LORazepam (ATIVAN) 1 MG tablet Take 1 mg by mouth daily.    . Multiple Vitamins-Minerals (MULTIVITAMIN WITH MINERALS) tablet Take 1 tablet by mouth daily. WITH IRON    . NUVIGIL 250 MG tablet Take 250 mg by mouth daily.  5  . ondansetron (ZOFRAN ODT) 4 MG disintegrating tablet Take 1 tablet (4 mg total) by mouth every 8 (eight) hours as needed for nausea or vomiting. 20 tablet 0  . QUEtiapine (SEROQUEL) 300 MG tablet Take 600 mg by mouth at bedtime.     . thiamine 100 MG tablet Take 100 mg by mouth daily.    Marland Kitchen topiramate (TOPAMAX) 100 MG tablet Take 100 mg by mouth at bedtime.     . valACYclovir (VALTREX) 500 MG tablet Take 1 tablet (500 mg total) by mouth daily as needed (fever blister). 90 tablet 2  . clopidogrel (PLAVIX) 75 MG tablet Take 75 mg by mouth daily.    Marland Kitchen Dextromethorphan HBr (VICKS DAYQUIL COUGH PO) Take 1 tablet by mouth 2 (two) times daily as needed (cough).  Facility-Administered Medications Prior to Visit  Medication Dose Route Frequency Provider Last Rate Last Admin  . cyanocobalamin ((VITAMIN B-12)) injection 1,000 mcg  1,000 mcg Intramuscular Q30 days Susy Frizzle, MD   1,000 mcg at 04/06/19 [id number]   Past Medical History:  Diagnosis Date  . Anemia   . Anxiety   . Arthritis   . Asthma   . Bipolar affective ([place])   . Blood transfusion without reported diagnosis   . Colostomy in place [facility])   . CVA (cerebrovascular accident) ([place])    left cerebellar infarct found accidentally on CT (2020)   . Depression   . Diverticulitis 2008   perforated/ requiring resection  . Family history of adverse reaction to anesthesia     MGM was placed on vent after surgery  . GERD (gastroesophageal reflux disease)    occ.  Marland Kitchen Headache   . History of hiatal hernia    small  . History of kidney stones    cystoscopy  basket removal  . Panic attacks   . Schizophrenia ([place])   . Shortness of breath dyspnea   . Sleep apnea    mild, does not use c-pap machine  . Substance abuse ([place])    12 years ago-crack cocaine   Past Surgical History:  Procedure Laterality Date  . Abdominal wall hernia  07/2007   open repair with lysis of adhesions  . BRONCHIAL BRUSHINGS  04/11/2019   Procedure: BRONCHIAL BRUSHINGS;  Surgeon: Collene Gobble, MD;  Location: [facility] ENDOSCOPY;  Service: Pulmonary;;  . BRONCHIAL WASHINGS  04/11/2019   Procedure: BRONCHIAL WASHINGS;  Surgeon: Collene Gobble, MD;  Location: [facility] ENDOSCOPY;  Service: Pulmonary;;  . COLONOSCOPY  03/13/2010   [id number] hemorrhoids likely cause of hematochezia, otherwise normal  . Colostomy reversal  11/2006  . ESOPHAGOGASTRODUODENOSCOPY  03/13/2010   [id number] esophagus, status post passage of a Maloney dilator/Small hiatal hernia/ Antral erosions, status post biopsy  . EXAMINATION UNDER ANESTHESIA N/A 05/11/2012   Procedure: EXAM UNDER ANESTHESIA;  Surgeon: Donato Heinz, MD;  Location: [facility] ORS;  Service: General;  Laterality: N/A;  . Exploratory laparotomy with resection  2008   colonoscopy  . FINE NEEDLE ASPIRATION  04/11/2019   Procedure: FINE NEEDLE ASPIRATION (FNA) LINEAR;  Surgeon: Collene Gobble, MD;  Location: [facility] ENDOSCOPY;  Service: Pulmonary;;  . FINGER CLOSED REDUCTION Right 08/05/2012   Procedure: CLOSED REDUCTION RIGHT THUMB (FINGER);  Surgeon: Linna Hoff, MD;  Location: [facility];  Service: Orthopedics;  Laterality: Right;  . Hx of abd wall seroma  08/2007   Drained via [place] in [place], [place]  . Hx of abd wall seroma  10/2007   Drained by Dr. Geroge Baseman in office  . IR RADIOLOGIST EVAL & MGMT  03/15/2019  . IR RADIOLOGIST EVAL & MGMT  04/06/2019  . Kidney  stones    . LAPAROSCOPIC GASTRIC SLEEVE RESECTION N/A 02/13/2014   Procedure: LAPAROSCOPIC GASTRIC SLEEVE RESECTION LYSIS OF ADHESIONS, UPPER ENDOSCOPY;  Surgeon: [name], MD;  Location: [facility]S;  Service: General;  Laterality: N/A;  . LAPAROSCOPY N/A 09/28/2014   Procedure: LAPAROSCOPY DIAGNOSTIC, INCISION AND DRAINAGE WITH LAPAROSCOPIC EXPLORATION OF ABDOMINAL WALL SEROMA with ultrasound;  Surgeon: Greer Pickerel, MD;  Location: [facility] ORS;  Service: General;  Laterality: N/A;  . LUNG BIOPSY  04/11/2019   Procedure: LUNG BIOPSY;  Surgeon: Collene Gobble, MD;  Location[name] ENDOSCOPY;  Service: Pulmonary;;  distal trachea  . PLACEMENT OF SETON N/A 05/11/2012   Procedure: PLACEMENT  OF SETON;  Surgeon: Donato Heinz, MD;  Location: [facility];  Service: General;  Laterality: N/A;  . root canal 7-16    . TONSILLECTOMY    . TREATMENT FISTULA ANAL     x 2  . VIDEO BRONCHOSCOPY WITH ENDOBRONCHIAL ULTRASOUND N/A 04/11/2019   Procedure: VIDEO BRONCHOSCOPY WITH ENDOBRONCHIAL ULTRASOUND;  Surgeon: Collene Gobble, MD;  Location: [facility] ENDOSCOPY;  Service: Pulmonary;  Laterality: N/A;   Social History   Socioeconomic History  . Marital status: Single    Spouse name: Not on file  . Number of children: 2  . Years of education: Not on file  . Highest education level: Not on file  Occupational History  . Occupation: Insurance claims handler: UNEMPLOYED  Tobacco Use  . Smoking status: Former Smoker    Packs/day: 0.50    Years: 14.00    Pack years: 7.00    Types: E-cigarettes    Quit date: 12/20/2013    Years since quitting: 5.3  . Smokeless tobacco: Never Used  . Tobacco comment: Vapor cigarettes.  Substance and Sexual Activity  . Alcohol use: No    Alcohol/week: 0.0 standard drinks  . Drug use: No  . Sexual activity: Not Currently    Birth control/protection: I.U.D.  Other Topics Concern  . Not on file  Social History Narrative  . Not on file   Social Determinants of Health   Financial  Resource Strain:   . Difficulty of Paying Living Expenses: Not on file  Food Insecurity:   . Worried About Charity fundraiser in the Last Year: Not on file  . Ran Out of Food in the Last Year: Not on file  Transportation Needs:   . Lack of Transportation (Medical): Not on file  . Lack of Transportation (Non-Medical): Not on file  Physical Activity:   . Days of Exercise per Week: Not on file  . Minutes of Exercise per Session: Not on file  Stress:   . Feeling of Stress : Not on file  Social Connections:   . Frequency of Communication with Friends and Family: Not on file  . Frequency of Social Gatherings with Friends and Family: Not on file  . Attends Religious Services: Not on file  . Active Member of Clubs or Organizations: Not on file  . Attends Archivist Meetings: Not on file  . Marital Status: Not on file   Family History  Problem Relation Age of Onset  . Asthma Maternal Grandmother       Review of Systems: Pertinent positive and negative review of systems were noted in the above HPI section. All other review of systems were otherwise negative.    Physical Exam: General: Well developed, well nourished, no acute distress Head: Normocephalic and atraumatic Eyes:  sclerae anicteric, EOMI Ears: Normal auditory acuity Mouth: Not examined, mask on during Covid-19 pandemic Neck: Supple, no masses or thyromegaly Lungs: decreased air movement and wheezes bilaterally  Heart: Regular rate and rhythm; no murmurs, rubs or bruits Abdomen: Soft, non tender and non distended. No masses, hepatosplenomegaly or hernias noted. Normal Bowel sounds Rectal: Deferred to colonoscopy  Musculoskeletal: Symmetrical with no gross deformities  Skin: No lesions on visible extremities Pulses:  Normal pulses noted Extremities: No clubbing, cyanosis, edema or deformities noted Neurological: Alert oriented x 4, grossly nonfocal Cervical Nodes:  No significant cervical adenopathy Inguinal  Nodes: No significant inguinal adenopathy Psychological:  Alert and cooperative. Normal mood and affect   Assessment and  Recommendations:  1. Iron deficiency anemia and positive FIT. R/O colorectal neoplasm, ulcer, AVM, poor iron absorption, etc. IgA and tTG today.  With prior gastric sleeve she may require long-term supplemental iron for adequate iron absorption.  Schedule colonoscopy and EGD.  Hold Fe for 5 days prior to colonoscopy. The risks (including bleeding, perforation, infection, missed lesions, medication reactions and possible hospitalization or surgery if complications occur), benefits, and alternatives to colonoscopy with possible biopsy and possible polypectomy were discussed with the patient and they consent to proceed. The risks (including bleeding, perforation, infection, missed lesions, medication reactions and possible hospitalization or surgery if complications occur), benefits, and alternatives to endoscopy with possible biopsy and possible dilation were discussed with the patient and they consent to proceed.   2.  History of cerebellar infarct.  Hold Plavix 5 days before procedure - will instruct when and how to resume after procedure. Low but real risk of cardiovascular event such as heart attack, stroke, embolism, thrombosis or ischemia/infarct of other organs off Plavix explained and need to seek urgent help if this occurs. The patient consents to proceed. Will communicate by phone or EMR with patient's prescribing provider to confirm that holding Plavix is reasonable in this case.   3.  Bilateral pulmonary wheezes with decreased air movement.  Contact Dr. Agustina Caroli office for further management.  4. Perforated diverticulitis in 2008 with a colostomy, Hartmann's pouch and subsequent takedown.  5. Incisional hernia with a subcutaneous abdominal wall seroma requiring surgical repair in 2016. Recent recurrent seroma drainage with a percutaneous drain placed.  Further follow-up with  Dr. Greer Pickerel.  6. S/P gastric sleeve in 2015  7/ S/P anal fistulotomy 2014    cc: Susy Frizzle, MD [address] [place],  [place] [address]

## 2019-04-19 NOTE — Telephone Encounter (Signed)
ATC the pt and person I spoke with stated she was not there so I have LMTCB  Will forward to [place] for further f/u on this, thanks

## 2019-04-21 ENCOUNTER — Ambulatory Visit: Payer: Medicare Other | Admitting: Adult Health

## 2019-04-24 ENCOUNTER — Other Ambulatory Visit: Payer: Self-pay

## 2019-04-24 ENCOUNTER — Encounter: Payer: Medicare Other | Attending: General Surgery | Admitting: Skilled Nursing Facility1

## 2019-04-24 DIAGNOSIS — Z713 Dietary counseling and surveillance: Secondary | ICD-10-CM | POA: Diagnosis not present

## 2019-04-24 DIAGNOSIS — E669 Obesity, unspecified: Secondary | ICD-10-CM

## 2019-04-24 DIAGNOSIS — K912 Postsurgical malabsorption, not elsewhere classified: Secondary | ICD-10-CM | POA: Diagnosis not present

## 2019-04-24 DIAGNOSIS — Z9884 Bariatric surgery status: Secondary | ICD-10-CM | POA: Insufficient documentation

## 2019-04-24 HISTORY — PX: COLONOSCOPY WITH ESOPHAGOGASTRODUODENOSCOPY (EGD): SHX5779

## 2019-04-24 NOTE — Telephone Encounter (Signed)
[name] has a probable new diagnosis of sarcoidosis with possible associated mild asthma (not yet fully determined). She just tolerated general anesthesia and I do not see any barriers to proceeding with her GI procedure.  Thanks  Baltazar Apo, MD, PhD 04/24/2019, 5:15 PM [facility] [phone] or if no answer [phone]

## 2019-04-24 NOTE — Progress Notes (Signed)
Bariatric Nutrition Follow-Up Visit Medical Nutrition Therapy  Appt Start Time: 10:00a   End Time: 10:30a  NUTRITION ASSESSMENT Anthropometrics  Start weight at [facility]: 315 lbs (date: 11/06/2013) Today's weight: 260lbs Weight change: +21.9 lbs (since previous nutrition visit, 12/10/2015)  Clinical  Medical hx: bipolar, anxiety Medications: see list Labs:    Lifestyle & Dietary Hx  Pt states she was dx. with sarcoidosis 2021. Pt states she will see surgeon this Wednesday to assess drain which pt states is draining. She notes she has had multiple bag replacements and empties the bag herself once daily. Pt states she is getting a colonoscopy and endoscopy soon. Pt states she no longer has an issue with diarrhea because she is taking iron. Pt states she is no longer experiencing constipation, noting she takes fiber sometimes. Pt states it is hard to stay way from fast food because family eats it. Pt states she is gaining weight from sweets like hershey kisses in the house as well as admitting to it being an emotional crutch. Pt states she started drinking premier shakes and lean cuisine but does not plan on doing this again. Pt states she feels money is an obstacle to eating healthy (states she buys prepped fruits and spends $40-50/week on son's foods including costly snack foods). Pt states she thinks she needs to stick to a diet plan. Pt states she drinks all day long including Drop and unsweetened tea with flavoring stating it had 90 calories. Pt states she did not want to come to this appointment because she did not meet goals from previous appointment, noting it is too hard to stay away from fast food. Pt deflected questions to how her mother and son behaved when asked questions abut herself. Dietitian discussed how difficult it is to stick to diet plan when pt continues to eat fast food.   Estimated daily fluid intake:   Estimated daily protein intake:  Supplements: Iron, B12 shot monthly, MV  with Iron, Calcium, Thiamin Current average weekly physical activity: None reported  24-Hr Dietary Recall First Meal: Premier protein shake, Strawberry Iced tea Snack: Salad w cucumbers and 1,000 island dressing, catalina red dressing Second Meal: Spaghetti, jar Prego sauce, about a cup. 2 pieces Pound cake w/ vanilla pudding. Or fast food Snack: Third Meal: Cup full of spaghetti, handful of M&Ms or fast food Snack:  Beverages: Strawberry Iced Tea 16 oz, Welch's orange pineapple, Diet Coke 20 oz, Sprite 12 oz   Post-Op Goals/ Signs/ Symptoms Using straws: no Drinking while eating: no Chewing/swallowing difficulties: no Changes in vision: no Changes to mood/headaches: no Hair loss/changes to skin/nails: no Difficulty focusing/concentrating: no Sweating: no Dizziness/lightheadedness: no Palpitations: no  Carbonated/caffeinated beverages: no N/V/D/C/Gas: no Abdominal pain: no Dumping syndrome: no    NUTRITION DIAGNOSIS  Overweight/obesity ([place]-3.3) related to past poor dietary habits and physical inactivity as evidenced by completed bariatric surgery and following dietary guidelines for continued weight loss and healthy nutrition status.     NUTRITION INTERVENTION Nutrition counseling (C-1) and education (E-2) to facilitate bariatric surgery goals, including: . The importance of consuming adequate calories as well as certain nutrients daily due to the body's need for essential vitamins, minerals, and fats . The importance of daily physical activity and to reach a goal of at least 150 minutes of moderate to vigorous physical activity weekly (or as directed by their physician) due to benefits such as increased musculature and improved lab values  Goals: NEW: Eat healthier fast food options for 2-3 meals.(using handout)  NEW: Reduce dessert intake, saying 'no' to your mom's dessert.    Handouts Provided Include   N/A  Learning Style & Readiness for Change Teaching method  utilized: Visual & Auditory  Demonstrated degree of understanding via: Teach Back  Barriers to learning/adherence to lifestyle change: Resistance to change  RD's Notes for Next Visit . Assess adherence to pt chosen goals . Revisit barriers to success    MONITORING & EVALUATION Dietary intake, weekly physical activity, body weight.  Next Steps Patient is to follow-up in 1 month.

## 2019-04-24 NOTE — Telephone Encounter (Signed)
Faxed anticoagulant clearanceletter to Dr. Merlene Laughter again.

## 2019-04-26 ENCOUNTER — Encounter: Payer: Self-pay | Admitting: Adult Health

## 2019-04-26 ENCOUNTER — Ambulatory Visit (INDEPENDENT_AMBULATORY_CARE_PROVIDER_SITE_OTHER): Payer: Medicare Other | Admitting: Adult Health

## 2019-04-26 ENCOUNTER — Other Ambulatory Visit: Payer: Self-pay

## 2019-04-26 ENCOUNTER — Ambulatory Visit (INDEPENDENT_AMBULATORY_CARE_PROVIDER_SITE_OTHER): Payer: Medicare Other

## 2019-04-26 VITALS — BP 114/72 | HR 83 | Temp 96.6°F | Ht 67.0 in | Wt 259.8 lb

## 2019-04-26 DIAGNOSIS — F319 Bipolar disorder, unspecified: Secondary | ICD-10-CM | POA: Diagnosis not present

## 2019-04-26 DIAGNOSIS — D869 Sarcoidosis, unspecified: Secondary | ICD-10-CM

## 2019-04-26 DIAGNOSIS — R05 Cough: Secondary | ICD-10-CM | POA: Diagnosis not present

## 2019-04-26 DIAGNOSIS — R059 Cough, unspecified: Secondary | ICD-10-CM

## 2019-04-26 DIAGNOSIS — Z9884 Bariatric surgery status: Secondary | ICD-10-CM | POA: Diagnosis not present

## 2019-04-26 DIAGNOSIS — L7682 Other postprocedural complications of skin and subcutaneous tissue: Secondary | ICD-10-CM | POA: Diagnosis not present

## 2019-04-26 MED ORDER — PREDNISONE 10 MG PO TABS: ORAL_TABLET | ORAL | 0 refills | Status: DC

## 2019-04-26 NOTE — Patient Instructions (Addendum)
Prednisone 20mg  daily for 7 days and then 10mg  daily for 7 days and then stop .  Delsym 2 tsp Twice daily for cough as needed  Tessalon Three times a day  As needed  Cough .  Work on not smoking/vaping.  Albuterol inhaler As needed   Chest xray today  Follow up in 2 weeks as planned with PFTs  Please contact office for sooner follow up if symptoms do not improve or worsen or seek emergency care

## 2019-04-26 NOTE — Assessment & Plan Note (Signed)
Pulmonary nodular disease, mediastinal and hilar adenopathy, and granulomatous inflammation on cytology, along with elevated ACE level consistent with sarcoidosis.  Patient appears to be having a flare with symptomatic cough.  PFTs are pending.  May also have a component of obstructive disease as she is a former smoker and continues to [place].  Vaping cessation was encouraged.  Will place on short course of prednisone and add cough suppressants. Patient education on steroid side effects.  Plan  Patient Instructions  Prednisone 20mg  daily for 7 days and then 10mg  daily for 7 days and then stop .  Delsym 2 tsp Twice daily for cough as needed  Tessalon Three times a day  As needed  Cough .  Work on not smoking/vaping.  Albuterol inhaler As needed   Chest xray today  Follow up in 2 weeks as planned with PFTs  Please contact office for sooner follow up if symptoms do not improve or worsen or seek emergency care

## 2019-04-26 NOTE — Progress Notes (Signed)
Left detailed msg on machine ok per DPR

## 2019-04-26 NOTE — Assessment & Plan Note (Signed)
Cough flare-patient has had ongoing cough of the last several weeks.  Suspect may be related to her underlying newly diagnosed sarcoidosis.  Chest x-ray today.  Short course of steroid challenge. Add cough suppressants with Delsym and Tessalon.  Plan  Patient Instructions  Prednisone 20mg  daily for 7 days and then 10mg  daily for 7 days and then stop .  Delsym 2 tsp Twice daily for cough as needed  Tessalon Three times a day  As needed  Cough .  Work on not smoking/vaping.  Albuterol inhaler As needed   Chest xray today  Follow up in 2 weeks as planned with PFTs  Please contact office for sooner follow up if symptoms do not improve or worsen or seek emergency care

## 2019-04-26 NOTE — Progress Notes (Signed)
_0  ID: [name], female    DOB: Jan 31, 1976, 44 y.o.   MRN: [id number]  Chief Complaint  Patient presents with  . Acute Visit    Cough     Referring provider: Susy Frizzle, MD  HPI: 44 year old female former cigarette smoker, currently using vapes, seen for pulmonary consult during hospitalization January 2021 for Abnormal CT chest with mediastinal lymphadenopathy and scattered pulmonary nodular disease. Medical history significant for morbid obesity, bipolar disorder, and obstructive sleep apnea-not on CPAP ,   Asthma   TEST/EVENTS :  CT chest 03/17/19-. Multiple pulmonary nodules scattered throughout the lungs bilaterally, in addition to extensive mediastinal and bilateral hilar lymphadenopathy  ACE level 03/23/19 72   PFTs September 2013 showed normal air flows, no bronchodilator response.  Normal diffusing capacity that corrects for adjusted alveolar volume.  04/26/2019 Acute OV : Abnormal CT chest /Sarcoid/ Cough  Patient presents for an acute office visit.  Patient was seen January 2021 during hospitalization for subcutaneous abdominal wall abscesses that required drainage.  CT chest showed multiple pulmonary nodules scattered throughout the lung-largest measuring 7 mm in right lower lobe.   and extensive mediastinal and bilateral hilar adenopathy.  ACE level was elevated at 72. Patient underwent bronchoscopy-ENB EBUS on April 11, 2019  [facility] report noted general erythematous and edematous airways with no overt obstruction with mucosa somewhat friable.  Small raised nodular lesion in the distal trachea.  BAL cultures were neg . Cytology neg for malignant cells.  Showed granulomatous inflammation, reactive changes.  Path showed reactive cells and acute inflammation.. This was felt to be consistent with probable sarcoidosis as she has an elevated ACE level. Patient says she continues to have an ongoing cough, that is minimally productive on occasion get out some  thick mucus.  No discolored mucus no fever no chest pain no hemoptysis.  No increased ankle edema.  No nausea vomiting. She does have albuterol that she uses on occasion which helps some.  She has Tessalon at home but does not feel like it works very well.  No Known Allergies  Immunization History  Administered Date(s) Administered  . Hep A / Hep B 07/05/2008, 08/16/2008, 05/10/2009  . Influenza Whole 11/06/2011  . Influenza,inj,Quad PF,6+ Mos 12/13/2014, 12/19/2015, 01/06/2017, 03/22/2018, 12/15/2018  . Influenza-Unspecified 11/06/2011, 10/29/2012  . Tdap 08/18/2010, 10/27/2011    Past Medical History:  Diagnosis Date  . Anemia   . Anxiety   . Arthritis   . Asthma   . Bipolar affective ([place])   . Blood transfusion without reported diagnosis   . Colostomy in place [facility])   . CVA (cerebrovascular accident) ([place])    left cerebellar infarct found accidentally on CT (2020)   . Depression   . Diverticulitis 2008   perforated/ requiring resection  . Family history of adverse reaction to anesthesia    MGM was placed on vent after surgery  . GERD (gastroesophageal reflux disease)    occ.  Marland Kitchen Headache   . History of hiatal hernia    small  . History of kidney stones    cystoscopy  basket removal  . Panic attacks   . Schizophrenia ([place])   . Shortness of breath dyspnea   . Sleep apnea    mild, does not use c-pap machine  . Substance abuse ([place])    12 years ago-crack cocaine    Tobacco History: Social History   Tobacco Use  Smoking Status Former Smoker  . Packs/day: 0.50  . Years: 14.00  .  Pack years: 7.00  . Types: E-cigarettes  . Quit date: 12/20/2013  . Years since quitting: 5.3  Smokeless Tobacco Never Used  Tobacco Comment   Vapor cigarettes.   Counseling given: Not Answered Comment: Vapor cigarettes.   Outpatient Medications Prior to Visit  Medication Sig Dispense Refill  . albuterol (VENTOLIN HFA) 108 (90 Base) MCG/ACT inhaler Inhale 2 puffs into the lungs  every 4 (four) hours as needed for wheezing or shortness of breath. 18 g 1  . amphetamine-dextroamphetamine (ADDERALL) 15 MG tablet Take 15 mg by mouth 3 (three) times daily.     Marland Kitchen atenolol (TENORMIN) 25 MG tablet 50 mg.    . atorvastatin (LIPITOR) 40 MG tablet TAKE 1 TABLET BY MOUTH EVERY DAY 90 tablet 1  . benztropine (COGENTIN) 2 MG tablet Take 2 mg by mouth at bedtime.    . calcium carbonate (OSCAL) 1500 (600 Ca) MG TABS tablet Take 600 mg of elemental calcium by mouth 2 (two) times daily with a meal.    . cholecalciferol (VITAMIN D3) 25 MCG (1000 UNIT) tablet Take 1,000 Units by mouth daily.    . clonazePAM (KLONOPIN) 1 MG tablet Take 1 mg by mouth 2 (two) times daily. Takes only at night    . clopidogrel (PLAVIX) 75 MG tablet Take 75 mg by mouth daily.    . ferrous sulfate 325 (65 FE) MG tablet Take 325 mg by mouth daily with breakfast.    . gabapentin (NEURONTIN) 300 MG capsule Take 300 mg by mouth 3 (three) times daily.    . haloperidol (HALDOL) 5 MG tablet Take 10 mg by mouth at bedtime.     Marland Kitchen LORazepam (ATIVAN) 1 MG tablet Take 1 mg by mouth daily.    . Multiple Vitamins-Minerals (MULTIVITAMIN WITH MINERALS) tablet Take 1 tablet by mouth daily. WITH IRON    . NUVIGIL 250 MG tablet Take 250 mg by mouth daily.  5  . ondansetron (ZOFRAN ODT) 4 MG disintegrating tablet Take 1 tablet (4 mg total) by mouth every 8 (eight) hours as needed for nausea or vomiting. 20 tablet 0  . QUEtiapine (SEROQUEL) 300 MG tablet Take 600 mg by mouth at bedtime.     . thiamine 100 MG tablet Take 100 mg by mouth daily.    Marland Kitchen topiramate (TOPAMAX) 100 MG tablet Take 100 mg by mouth at bedtime.     . valACYclovir (VALTREX) 500 MG tablet Take 1 tablet (500 mg total) by mouth daily as needed (fever blister). 90 tablet 2   Facility-Administered Medications Prior to Visit  Medication Dose Route Frequency Provider Last Rate Last Admin  . cyanocobalamin ((VITAMIN B-12)) injection 1,000 mcg  1,000 mcg Intramuscular Q30  days Susy Frizzle, MD   1,000 mcg at 04/06/19 9563     Review of Systems:   Constitutional:   No  weight loss, night sweats,  Fevers, chills, + fatigue, or  lassitude.  HEENT:   No headaches,  Difficulty swallowing,  Tooth/dental problems, or  Sore throat,                No sneezing, itching, ear ache,  +nasal congestion, post nasal drip,   CV:  No chest pain,  Orthopnea, PND, swelling in lower extremities, anasarca, dizziness, palpitations, syncope.   GI  No heartburn, indigestion, abdominal pain, nausea, vomiting, diarrhea, change in bowel habits, loss of appetite, bloody stools.   Resp: No shortness of breath with exertion or at rest.  No excess mucus, no productive cough,  No non-productive cough,  No coughing up of blood.  No change in color of mucus.  No wheezing.  No chest wall deformity  Skin: no rash or lesions.  GU: no dysuria, change in color of urine, no urgency or frequency.  No flank pain, no hematuria   MS:  No joint pain or swelling.  No decreased range of motion.  No back pain.    Physical Exam  BP 114/72 (BP Location: Left Arm, Cuff Size: Normal)   Pulse 83   Temp (!) 96.6 F (35.9 C) (Temporal)   Ht _0  (1.702 m)   Wt 259 lb 12.8 oz (117.8 kg)   SpO2 97% Comment: RA  BMI 40.69 kg/m   GEN: A/Ox3; pleasant , NAD, BMI 40    HEENT:  [place]/AT,  EACs-clear, TMs-wnl, NOSE-clear, THROAT-clear, no lesions, no postnasal drip or exudate noted.   NECK:  Supple w/ fair ROM; no JVD; normal carotid impulses w/o bruits; no thyromegaly or nodules palpated; no lymphadenopathy.    RESP  Clear  P & A; w/o, wheezes/ rales/ or rhonchi. no accessory muscle use, no dullness to percussion  CARD:  RRR, no m/r/g, no peripheral edema, pulses intact, no cyanosis or clubbing.  GI:   Soft & nt; nml bowel sounds; no organomegaly or masses detected.   Musco: Warm bil, no deformities or joint swelling noted.   Neuro: alert, no focal deficits noted.    Skin: Warm, no  lesions or rashes    Lab Results:  CBC  BNP No results found for: BNP  ProBNP No results found for: PROBNP  Imaging: DG Chest 2 View  Result Date: 04/26/2019 CLINICAL DATA:  Cough EXAM: CHEST - 2 VIEW COMPARISON:  CT chest, 03/17/2019, chest radiographs, 02/24/2019 FINDINGS: The heart is normal in size. Redemonstrated bilateral hilar lymphadenopathy. No acute airspace opacity. Numerous bilateral pulmonary nodules. The visualized skeletal structures are unremarkable. IMPRESSION: 1.  No acute abnormality of the lungs. 2. Numerous bilateral pulmonary nodules and bilateral hilar lymphadenopathy, as seen on prior CT. Electronically Signed   By: Eddie Candle M.D.   On: 04/26/2019 09:49   IR Radiologist Eval & Mgmt  Result Date: 04/06/2019 Please refer to notes tab for details about interventional procedure. (Op Note)   cyanocobalamin ((VITAMIN B-12)) injection 1,000 mcg    Date Action Dose Route User   Discharged on 04/11/2019   Admitted on 04/11/2019   04/06/2019 8675 Given 1,000 mcg Intramuscular (Right Deltoid) Six, Christina H, LPN   4/49/2010 0712 Given 1,000 mcg Intramuscular (Left Deltoid) Shary Decamp B, RMA      No flowsheet data found.  No results found for: NITRICOXIDE      Assessment & Plan:   Sarcoidosis Pulmonary nodular disease, mediastinal and hilar adenopathy, and granulomatous inflammation on cytology, along with elevated ACE level consistent with sarcoidosis.  Patient appears to be having a flare with symptomatic cough.  PFTs are pending.  May also have a component of obstructive disease as she is a former smoker and continues to [place].  Vaping cessation was encouraged.  Will place on short course of prednisone and add cough suppressants. Patient education on steroid side effects.  Plan  Patient Instructions  Prednisone 31m daily for 7 days and then 132mdaily for 7 days and then stop .  Delsym 2 tsp Twice daily for cough as needed  Tessalon Three times a  day  As needed  Cough .  Work on not smoking/vaping.  Albuterol inhaler As needed  Chest xray today  Follow up in 2 weeks as planned with PFTs  Please contact office for sooner follow up if symptoms do not improve or worsen or seek emergency care         Cough Cough flare-patient has had ongoing cough of the last several weeks.  Suspect may be related to her underlying newly diagnosed sarcoidosis.  Chest x-ray today.  Short course of steroid challenge. Add cough suppressants with Delsym and Tessalon.  Plan  Patient Instructions  Prednisone 57m daily for 7 days and then 175mdaily for 7 days and then stop .  Delsym 2 tsp Twice daily for cough as needed  Tessalon Three times a day  As needed  Cough .  Work on not smoking/vaping.  Albuterol inhaler As needed   Chest xray today  Follow up in 2 weeks as planned with PFTs  Please contact office for sooner follow up if symptoms do not improve or worsen or seek emergency care           TaRexene EdisonNP 04/26/2019

## 2019-04-27 ENCOUNTER — Telehealth: Payer: Self-pay | Admitting: Family Medicine

## 2019-04-27 DIAGNOSIS — F4010 Social phobia, unspecified: Secondary | ICD-10-CM | POA: Diagnosis not present

## 2019-04-27 DIAGNOSIS — Z79899 Other long term (current) drug therapy: Secondary | ICD-10-CM | POA: Diagnosis not present

## 2019-04-27 DIAGNOSIS — F319 Bipolar disorder, unspecified: Secondary | ICD-10-CM | POA: Diagnosis not present

## 2019-04-27 NOTE — Telephone Encounter (Signed)
Pt called and states that she was given Tessalon by Pulmonologist yesterday and it does nothing for her cough and wanted to know if you would call her in some more hycodan?

## 2019-04-27 NOTE — Telephone Encounter (Signed)
Pt aware.

## 2019-04-27 NOTE — Telephone Encounter (Signed)
She needs to address this with the pulmonologist.  One provider should be managing this.

## 2019-04-30 ENCOUNTER — Other Ambulatory Visit: Payer: Self-pay | Admitting: Family Medicine

## 2019-05-05 NOTE — Telephone Encounter (Signed)
Called Dr. Freddie Apley office and spoke with Raquel Sarna to make sure they have received clearance letter for Plavix. Raquel Sarna states she cannot see that they have received it in there system but they do have a different fax number now. Raquel Sarna informed to fax to new fax number [phone]. Faxed clearance letter.

## 2019-05-08 NOTE — Telephone Encounter (Signed)
Corrected fax number and re-faxed clearance letter to [phone]

## 2019-05-10 LAB — FUNGUS CULTURE WITH STAIN

## 2019-05-10 LAB — FUNGAL ORGANISM REFLEX

## 2019-05-10 LAB — FUNGUS CULTURE RESULT

## 2019-05-12 NOTE — Telephone Encounter (Signed)
Faxed letter again to Dr. Merlene Laughter with a urgent request.

## 2019-05-15 ENCOUNTER — Ambulatory Visit: Payer: Self-pay | Admitting: General Surgery

## 2019-05-15 ENCOUNTER — Other Ambulatory Visit: Payer: Self-pay

## 2019-05-15 ENCOUNTER — Other Ambulatory Visit (HOSPITAL_COMMUNITY)
Admission: RE | Admit: 2019-05-15 | Discharge: 2019-05-15 | Disposition: A | Discharge status: Home / Self Care | Payer: Medicare Other | Source: Ambulatory Visit | Attending: Emergency Medicine | Admitting: Emergency Medicine

## 2019-05-15 ENCOUNTER — Other Ambulatory Visit (HOSPITAL_COMMUNITY): Payer: Medicare Other

## 2019-05-15 DIAGNOSIS — Z01812 Encounter for preprocedural laboratory examination: Secondary | ICD-10-CM | POA: Insufficient documentation

## 2019-05-15 DIAGNOSIS — Z20822 Contact with and (suspected) exposure to covid-19: Secondary | ICD-10-CM | POA: Insufficient documentation

## 2019-05-15 LAB — SARS CORONAVIRUS 2 (TAT 6-24 HRS): SARS Coronavirus 2: NEGATIVE

## 2019-05-15 NOTE — Telephone Encounter (Signed)
Received fax from Dr. Merlene Laughter stating patient can hold Plavix 5 days prior to her procedure and resume day after her procedure.  Left message for patient to return my call.

## 2019-05-15 NOTE — Telephone Encounter (Signed)
Patient informed and verbalized understanding

## 2019-05-17 DIAGNOSIS — E669 Obesity, unspecified: Secondary | ICD-10-CM | POA: Diagnosis not present

## 2019-05-17 DIAGNOSIS — I6930 Unspecified sequelae of cerebral infarction: Secondary | ICD-10-CM | POA: Diagnosis not present

## 2019-05-17 DIAGNOSIS — I10 Essential (primary) hypertension: Secondary | ICD-10-CM | POA: Diagnosis not present

## 2019-05-17 DIAGNOSIS — R519 Headache, unspecified: Secondary | ICD-10-CM | POA: Diagnosis not present

## 2019-05-18 ENCOUNTER — Ambulatory Visit (INDEPENDENT_AMBULATORY_CARE_PROVIDER_SITE_OTHER): Payer: Medicare Other | Admitting: Emergency Medicine

## 2019-05-18 ENCOUNTER — Encounter: Payer: Self-pay | Admitting: Primary Care

## 2019-05-18 ENCOUNTER — Ambulatory Visit (INDEPENDENT_AMBULATORY_CARE_PROVIDER_SITE_OTHER): Payer: Medicare Other | Admitting: Primary Care

## 2019-05-18 ENCOUNTER — Other Ambulatory Visit: Payer: Self-pay

## 2019-05-18 DIAGNOSIS — D869 Sarcoidosis, unspecified: Secondary | ICD-10-CM

## 2019-05-18 DIAGNOSIS — R0602 Shortness of breath: Secondary | ICD-10-CM | POA: Diagnosis not present

## 2019-05-18 DIAGNOSIS — R911 Solitary pulmonary nodule: Secondary | ICD-10-CM

## 2019-05-18 DIAGNOSIS — R590 Localized enlarged lymph nodes: Secondary | ICD-10-CM

## 2019-05-18 LAB — PULMONARY FUNCTION TEST
DL/VA % pred: 100 %
DL/VA: 4.37 ml/min/mmHg/L
DLCO cor % pred: 84 %
DLCO cor: 19.35 ml/min/mmHg
DLCO unc % pred: 84 %
DLCO unc: 19.35 ml/min/mmHg
FEF 25-75 Post: 2.68 L/sec
FEF 25-75 Pre: 2.12 L/sec
FEF2575-%Change-Post: 25 %
FEF2575-%Pred-Post: 85 %
FEF2575-%Pred-Pre: 67 %
FEV1-%Change-Post: 5 %
FEV1-%Pred-Post: 76 %
FEV1-%Pred-Pre: 72 %
FEV1-Post: 2.4 L
FEV1-Pre: 2.28 L
FEV1FVC-%Change-Post: 4 %
FEV1FVC-%Pred-Pre: 97 %
FEV6-%Change-Post: 0 %
FEV6-%Pred-Post: 76 %
FEV6-%Pred-Pre: 75 %
FEV6-Post: 2.88 L
FEV6-Pre: 2.85 L
FEV6FVC-%Pred-Post: 102 %
FEV6FVC-%Pred-Pre: 102 %
FVC-%Change-Post: 0 %
FVC-%Pred-Post: 74 %
FVC-%Pred-Pre: 74 %
FVC-Post: 2.88 L
FVC-Pre: 2.85 L
Post FEV1/FVC ratio: 83 %
Post FEV6/FVC ratio: 100 %
Pre FEV1/FVC ratio: 80 %
Pre FEV6/FVC Ratio: 100 %
RV % pred: 101 %
RV: 1.72 L
TLC % pred: 89 %
TLC: 4.73 L

## 2019-05-18 NOTE — Patient Instructions (Signed)
Pulmonary function testing showed no evidence of obstruction or COPD. Diffusion capacity is normal. Mild restrictive lung disease either d/t weight, sarcoidosis or asthma.   Recommend you stay active and work on weight loss, if you have further episodes of shortness of breath/chest pain or cough please notify office  Follow-up 6 months with Dr. Lamonte Sakai

## 2019-05-18 NOTE — Progress Notes (Signed)
PFT done today. 

## 2019-05-18 NOTE — Progress Notes (Signed)
@Patient  ID: [name], female    DOB: February 14, 1976, 44 y.o.   MRN: [id number]  Chief Complaint  Patient presents with  . Follow-up    Feel better- sob with exertion,cough dry,wheezing    Referring provider: Susy Frizzle, MD  HPI: 44 year old female, former smoker (currently using vapes). PMH significant for sarcoidosis, OSA, asthma, bipolar disorder, obesity, GERD. Seen for pulmonary consult during hospitalization in January 2021 for abnormal CT chest with mediastinal lymphadenopathy and scattered pulmonary nodular disease. She was most recently seen on 04/26/19 by pulmonary NP and treated for sarcoidosis flare with oral prednisone.  05/18/2019 Patient presents today for 2 week follow-up with PFTs. She reports no issues with breathing right now. She previous had dyspnea and cough which resolved with 14 day prednisone taper. She rarely requires her albuterol rescue inhaler once every 2 weeks.    Imaging: CT Chest 03/17/19-. Multiple pulmonary nodules scattered throughout the lungs bilaterally, in addition to extensive mediastinal and bilateral hilar lymphadenopathy  Pulmonary function testing: - September 2013- Normal air flows. No bronchodilator response.  Normal diffusing capacity that corrects for adjusted alveolar volume.  05/18/2019- FVC 2.88 (74); FEV1 2.40 (76); RATIO 83,TLC 89%, DLCOcor 19.35 (84%) Mild restriction, no obstruction. No bronchodilator response. Normal diffusion capacity.   ACE level 03/23/19 72   No Known Allergies  Immunization History  Administered Date(s) Administered  . Hep A / Hep B 07/05/2008, 08/16/2008, 05/10/2009  . Influenza Whole 11/06/2011  . Influenza,inj,Quad PF,6+ Mos 12/13/2014, 12/19/2015, 01/06/2017, 03/22/2018, 12/15/2018  . Influenza-Unspecified 11/06/2011, 10/29/2012  . Tdap 08/18/2010, 10/27/2011    Past Medical History:  Diagnosis Date  . ADHD   . Anxiety   . Arthritis   . Asthma   . Bipolar affective ([place])   . Blood  transfusion without reported diagnosis   . Colostomy in place [facility])   . CVA (cerebrovascular accident) ([place])    left cerebellar infarct found accidentally on CT (2020)   . Depression   . Diverticulitis 2008   perforated/ requiring resection  . Family history of adverse reaction to anesthesia    MGM was placed on vent after surgery  . Gallstones   . GERD (gastroesophageal reflux disease)    occ.  Marland Kitchen Headache   . Herpes simplex   . History of hiatal hernia    small  . History of kidney stones    cystoscopy  basket removal  . Iron deficiency anemia   . Obese   . OCD (obsessive compulsive disorder)   . Panic attacks   . Pulmonary nodules   . Sarcoidosis   . Schizophrenia ([place])   . Shortness of breath dyspnea   . Sleep apnea    mild, does not use c-pap machine  . Substance abuse ([place])    12 years ago-crack cocaine    Tobacco History: Social History   Tobacco Use  Smoking Status Former Smoker  . Packs/day: 0.50  . Years: 14.00  . Pack years: 7.00  . Types: E-cigarettes  . Quit date: 12/20/2013  . Years since quitting: 5.4  Smokeless Tobacco Never Used  Tobacco Comment   Vapor cigarettes.   Counseling given: Not Answered Comment: Vapor cigarettes.   Outpatient Medications Prior to Visit  Medication Sig Dispense Refill  . atenolol (TENORMIN) 25 MG tablet Take 25 mg by mouth 2 (two) times daily.     . benztropine (COGENTIN) 2 MG tablet Take 2 mg by mouth at bedtime.    . clopidogrel (PLAVIX)  75 MG tablet Take 75 mg by mouth daily.    . ferrous sulfate 325 (65 FE) MG tablet Take 325 mg by mouth daily with breakfast.    . gabapentin (NEURONTIN) 300 MG capsule Take 300 mg by mouth 3 (three) times daily.    Marland Kitchen LORazepam (ATIVAN) 1 MG tablet Take 1 mg by mouth in the morning, at noon, in the evening, and at bedtime.     . Multiple Vitamins-Minerals (MULTIVITAMIN WITH MINERALS) tablet Take 1 tablet by mouth daily.     Marland Kitchen NUVIGIL 250 MG tablet Take 250 mg by mouth daily.  5    . ondansetron (ZOFRAN ODT) 4 MG disintegrating tablet Take 1 tablet (4 mg total) by mouth every 8 (eight) hours as needed for nausea or vomiting. 20 tablet 0  . QUEtiapine (SEROQUEL) 300 MG tablet Take 600 mg by mouth at bedtime.     . topiramate (TOPAMAX) 100 MG tablet Take 100 mg by mouth at bedtime.     . valACYclovir (VALTREX) 500 MG tablet Take 1 tablet (500 mg total) by mouth daily as needed (fever blister). (Patient taking differently: Take 2,000 mg by mouth daily as needed (fever blister). ) 90 tablet 2  . albuterol (VENTOLIN HFA) 108 (90 Base) MCG/ACT inhaler INHALE 2 PUFFS INTO THE LUNGS EVERY 4 (FOUR) HOURS AS NEEDED FOR WHEEZING OR SHORTNESS OF BREATH. 18 g 1  . amphetamine-dextroamphetamine (ADDERALL) 15 MG tablet Take 15 mg by mouth 3 (three) times daily.     . calcium carbonate (OSCAL) 1500 (600 Ca) MG TABS tablet Take 600 mg of elemental calcium by mouth 2 (two) times daily with a meal.    . cholecalciferol (VITAMIN D3) 25 MCG (1000 UNIT) tablet Take 1,000 Units by mouth daily.    . haloperidol (HALDOL) 5 MG tablet Take 10 mg by mouth at bedtime.     . thiamine 100 MG tablet Take 100 mg by mouth daily.    Marland Kitchen atorvastatin (LIPITOR) 40 MG tablet TAKE 1 TABLET BY MOUTH EVERY DAY (Patient taking differently: Take 40 mg by mouth daily. ) 90 tablet 1  . clonazePAM (KLONOPIN) 1 MG tablet Take 1 mg by mouth 2 (two) times daily. Takes only at night    . predniSONE (DELTASONE) 10 MG tablet 2 tabs daily for 1 week , then 1 tab daily for 1 week, then stop. (Patient not taking: Reported on 05/18/2019) 21 tablet 0   Facility-Administered Medications Prior to Visit  Medication Dose Route Frequency Provider Last Rate Last Admin  . cyanocobalamin ((VITAMIN B-12)) injection 1,000 mcg  1,000 mcg Intramuscular Q30 days Susy Frizzle, MD   1,000 mcg at 05/25/19 1608    Review of Systems  Review of Systems  Constitutional: Negative.   Respiratory: Negative for cough and shortness of breath.      Physical Exam  BP 100/60 (BP Location: Right Arm, Cuff Size: Large)   Pulse 78   Temp 97.8 F (36.6 C) (Temporal)   Ht 5' 5.5" (1.664 m)   Wt 259 lb 12.8 oz (117.8 kg)   LMP 05/17/2019 (Exact Date)   SpO2 96%   BMI 42.58 kg/m  Physical Exam Constitutional:      Appearance: Normal appearance.  HENT:     Head: Normocephalic and atraumatic.  Cardiovascular:     Rate and Rhythm: Normal rate.  Pulmonary:     Effort: Pulmonary effort is normal.     Breath sounds: Normal breath sounds.  Skin:    General:  Skin is warm.  Neurological:     General: No focal deficit present.     Mental Status: She is alert. Mental status is at baseline.  Psychiatric:        Mood and Affect: Mood normal.        Thought Content: Thought content normal.      Lab Results:  CBC    Component Value Date/Time   WBC 6.4 04/11/2019 0708   RBC 4.37 04/11/2019 0708   HGB 12.0 04/11/2019 0708   HGB 12.5 11/12/2014 1359   HCT 38.5 04/11/2019 0708   HCT 38.7 11/12/2014 1359   PLT 305 04/11/2019 0708   PLT 347 11/12/2014 1359   MCV 88.1 04/11/2019 0708   MCV 87 11/12/2014 1359   MCH 27.5 04/11/2019 0708   MCHC 31.2 04/11/2019 0708   RDW 16.8 (H) 04/11/2019 0708   RDW 13.7 11/12/2014 1359   LYMPHSABS 1,313 04/06/2019 0815   LYMPHSABS 2.0 11/12/2014 1359   MONOABS 0.8 02/19/2019 1316   EOSABS 325 04/06/2019 0815   EOSABS 0.4 11/12/2014 1359   BASOSABS 13 04/06/2019 0815   BASOSABS 0.0 11/12/2014 1359    BMET    Component Value Date/Time   NA 138 04/11/2019 0708   K 3.3 (L) 04/11/2019 0708   CL 103 04/11/2019 0708   CO2 24 04/11/2019 0708   GLUCOSE 90 04/11/2019 0708   BUN 5 (L) 04/11/2019 0708   CREATININE 0.65 04/11/2019 0708   CREATININE 0.50 03/06/2019 1108   CALCIUM 9.1 04/11/2019 0708   GFRNONAA >60 04/11/2019 0708   GFRNONAA 119 03/06/2019 1108   GFRAA >60 04/11/2019 0708   GFRAA 138 03/06/2019 1108    BNP No results found for: BNP  ProBNP No results found for:  PROBNP  Imaging: No results found.   Assessment & Plan:   Sarcoidosis - No active symptoms; cough/dyspnea resolved with 14 day prednisone taper in March 2021 - Pulmonary nodular disease, mediastinal and hilar adenopathy on CT imaging; granulomatous inflammation on cytology along with elevated ACE level are consistent with sarcoidosis  - PFTs 05/14/19 showed mild restriction, normal diffusion capacity   Dyspnea - Pulmonary function testing showed no evidence of obstructive lung disease - Recommend patient stay active and work on weight loss, if she experiences further episodes of shortness of breath/chest pain or cough instructed to notify office - Follow-up 6 months with Dr. Gigi Gin, NP 06/10/2019

## 2019-05-19 ENCOUNTER — Telehealth: Payer: Self-pay | Admitting: Gastroenterology

## 2019-05-19 NOTE — Telephone Encounter (Signed)
Returning your call. Can be reached at [phone].

## 2019-05-19 NOTE — Telephone Encounter (Signed)
Pt requested a call back to discuss previous surgeries she has had.

## 2019-05-19 NOTE — Telephone Encounter (Signed)
Patient wanted to make sure that Dr. Fuller Plan is aware she has a abdominal wall drain for a seroma. Also, patient she wants for you to be aware of her extensive history with her diverticulitis that has caused her perforation and colostomy. Patient had several concerns regarding her history and the upcoming procedures. Informed patient that Dr. Fuller Plan has reviewed her history in detail and I will also send a message to him to review again prior to procedures. Patient verbalized understanding.

## 2019-05-19 NOTE — Telephone Encounter (Signed)
Left a message for patient to return my call. 

## 2019-05-21 NOTE — Telephone Encounter (Signed)
I am aware. My office note outlines her medical and surgical history and the indications for colonoscopy and EGD. We reviewed the risks, benefits and alternatives for the procedures.

## 2019-05-22 ENCOUNTER — Other Ambulatory Visit: Payer: Self-pay

## 2019-05-22 ENCOUNTER — Other Ambulatory Visit: Payer: Self-pay | Admitting: Gastroenterology

## 2019-05-22 ENCOUNTER — Ambulatory Visit (INDEPENDENT_AMBULATORY_CARE_PROVIDER_SITE_OTHER): Payer: Medicare Other

## 2019-05-22 ENCOUNTER — Encounter: Payer: Medicare Other | Admitting: Skilled Nursing Facility1

## 2019-05-22 DIAGNOSIS — K912 Postsurgical malabsorption, not elsewhere classified: Secondary | ICD-10-CM | POA: Diagnosis not present

## 2019-05-22 DIAGNOSIS — Z1159 Encounter for screening for other viral diseases: Secondary | ICD-10-CM | POA: Diagnosis not present

## 2019-05-22 DIAGNOSIS — E669 Obesity, unspecified: Secondary | ICD-10-CM

## 2019-05-22 DIAGNOSIS — Z9884 Bariatric surgery status: Secondary | ICD-10-CM | POA: Diagnosis not present

## 2019-05-22 DIAGNOSIS — Z713 Dietary counseling and surveillance: Secondary | ICD-10-CM | POA: Diagnosis not present

## 2019-05-22 LAB — SARS CORONAVIRUS 2 (TAT 6-24 HRS): SARS Coronavirus 2: NEGATIVE

## 2019-05-22 NOTE — Progress Notes (Signed)
Bariatric Nutrition Follow-Up Visit Medical Nutrition Therapy  Appt Start Time: 10:00a   End Time: 10:30a  NUTRITION ASSESSMENT Anthropometrics  Start weight at [facility]: 315 lbs (date: 11/06/2013) Today's weight: 261.1lbs Weight change: +1 lbs (since previous nutrition visit)  Clinical  Medical hx: bipolar, anxiety Medications: see list Labs:    Lifestyle & Dietary Hx  Pt states she has only eaten fast food 10 times in the last month stating she was able to do that by getting "good stuff" from the store. Pt states she has been eating bologna sandwiches, sugar free cookies, water flavorings, and frozen meals. Pt states she takes laxatives to "move the food out of her" Dietitian advised she not continue this behavior. Pt states she eats in her bed most often. Pt states April 30th she is getting her drain removed.    Estimated daily fluid intake:   Estimated daily protein intake:  Supplements: Iron, B12 shot monthly, MV with Iron, Calcium, Thiamin Current average weekly physical activity: None reported  24-Hr Dietary Recall First Meal: Premier protein shake, Strawberry Iced tea Snack: Salad w cucumbers and 1,000 island dressing, catalina red dressing Second Meal: Spaghetti, jar Prego sauce, about a cup. 2 pieces Pound cake w/ vanilla pudding. Or fast food Snack: Third Meal: Cup full of spaghetti, handful of M&Ms or fast food Snack:  Beverages: Strawberry Iced Tea 16 oz,  Diet Coke 20 oz, water with flavoring with strawberries with splenda on it   Post-Op Goals/ Signs/ Symptoms Using straws: no Drinking while eating: no Chewing/swallowing difficulties: no Changes in vision: no Changes to mood/headaches: no Hair loss/changes to skin/nails: no Difficulty focusing/concentrating: no Sweating: no Dizziness/lightheadedness: no Palpitations: no  Carbonated/caffeinated beverages: no N/V/D/C/Gas: no Abdominal pain: no Dumping syndrome: no    NUTRITION DIAGNOSIS  Overweight/obesity  ([place]-3.3) related to past poor dietary habits and physical inactivity as evidenced by completed bariatric surgery and following dietary guidelines for continued weight loss and healthy nutrition status.     NUTRITION INTERVENTION Nutrition counseling (C-1) and education (E-2) to facilitate bariatric surgery goals, including: . The importance of consuming adequate calories as well as certain nutrients daily due to the body's need for essential vitamins, minerals, and fats . The importance of daily physical activity and to reach a goal of at least 150 minutes of moderate to vigorous physical activity weekly (or as directed by their physician) due to benefits such as increased musculature and improved lab values  Goals: -Continue: Eat healthier fast food options for 2-3 meals.(using handout) -Continue: Reduce dessert intake, saying 'no' to your mom's dessert. -Continue to NOT eat bread with your meal -Continue to limit fast food -Continue to avoid your [place] tea -Continue to grocery shop at least every other week -[id number] eating pork rinds; do not buy them anymore  -NEW:1 yogurt at night would be a fine snack -NEW: Do NOT eat anything in your bed even if you think of it as healthy  Handouts Provided Include   N/A  Learning Style & Readiness for Change Teaching method utilized: Visual & Auditory  Demonstrated degree of understanding via: Teach Back  Barriers to learning/adherence to lifestyle change: Resistance to change  RD's Notes for Next Visit . Assess adherence to pt chosen goals . Revisit barriers to success    MONITORING & EVALUATION Dietary intake, weekly physical activity, body weight.  Next Steps Patient is to follow-up in 1 month.

## 2019-05-22 NOTE — Patient Instructions (Addendum)
-  Continue to NOT eat bread with your meal  -Continue to limit fast food  -Continue to avoid your [place] tea  -Continue to grocery shop at least every other week  -Stop eating pork rinds; do not buy them anymore   -1 yogurt at night would be a fine snack  -Do NOT eat anything in your bed even if you think of it as healthy

## 2019-05-24 ENCOUNTER — Ambulatory Visit (AMBULATORY_SURGERY_CENTER): Payer: Medicare Other | Admitting: Gastroenterology

## 2019-05-24 ENCOUNTER — Encounter: Payer: Self-pay | Admitting: Gastroenterology

## 2019-05-24 ENCOUNTER — Other Ambulatory Visit: Payer: Self-pay

## 2019-05-24 VITALS — BP 144/87 | HR 68 | Temp 97.3°F | Resp 15 | Ht 67.0 in | Wt 264.0 lb

## 2019-05-24 DIAGNOSIS — D122 Benign neoplasm of ascending colon: Secondary | ICD-10-CM

## 2019-05-24 DIAGNOSIS — K3189 Other diseases of stomach and duodenum: Secondary | ICD-10-CM | POA: Diagnosis not present

## 2019-05-24 DIAGNOSIS — K640 First degree hemorrhoids: Secondary | ICD-10-CM | POA: Diagnosis not present

## 2019-05-24 DIAGNOSIS — D123 Benign neoplasm of transverse colon: Secondary | ICD-10-CM | POA: Diagnosis not present

## 2019-05-24 DIAGNOSIS — K2100 Gastro-esophageal reflux disease with esophagitis, without bleeding: Secondary | ICD-10-CM | POA: Diagnosis not present

## 2019-05-24 DIAGNOSIS — K635 Polyp of colon: Secondary | ICD-10-CM

## 2019-05-24 DIAGNOSIS — K5730 Diverticulosis of large intestine without perforation or abscess without bleeding: Secondary | ICD-10-CM

## 2019-05-24 DIAGNOSIS — D509 Iron deficiency anemia, unspecified: Secondary | ICD-10-CM | POA: Diagnosis not present

## 2019-05-24 DIAGNOSIS — D649 Anemia, unspecified: Secondary | ICD-10-CM | POA: Diagnosis not present

## 2019-05-24 DIAGNOSIS — R195 Other fecal abnormalities: Secondary | ICD-10-CM | POA: Diagnosis not present

## 2019-05-24 HISTORY — PX: COLONOSCOPY: SHX174

## 2019-05-24 HISTORY — PX: UPPER GASTROINTESTINAL ENDOSCOPY: SHX188

## 2019-05-24 LAB — ACID FAST CULTURE WITH REFLEXED SENSITIVITIES (MYCOBACTERIA)
Acid Fast Culture: NEGATIVE
Acid Fast Culture: NEGATIVE

## 2019-05-24 MED ORDER — PANTOPRAZOLE SODIUM 40 MG PO TBEC: 40.0000 mg | DELAYED_RELEASE_TABLET | Freq: Every day | ORAL | 11 refills | Status: DC

## 2019-05-24 MED ORDER — SODIUM CHLORIDE 0.9 % IV SOLN: 500.0000 mL | Freq: Once | INTRAVENOUS | Status: DC

## 2019-05-24 NOTE — Patient Instructions (Signed)
YOU HAD AN ENDOSCOPIC PROCEDURE TODAY AT [facility]:   Refer to the procedure report that was given to you for any specific questions about what was found during the examination.  If the procedure report does not answer your questions, please call your gastroenterologist to clarify.  If you requested that your care partner not be given the details of your procedure findings, then the procedure report has been included in a sealed envelope for you to review at your convenience later.  YOU SHOULD EXPECT: Some feelings of bloating in the abdomen. Passage of more gas than usual.  Walking can help get rid of the air that was put into your GI tract during the procedure and reduce the bloating. If you had a lower endoscopy (such as a colonoscopy or flexible sigmoidoscopy) you may notice spotting of blood in your stool or on the toilet paper. If you underwent a bowel prep for your procedure, you may not have a normal bowel movement for a few days.  Please Note:  You might notice some irritation and congestion in your nose or some drainage.  This is from the oxygen used during your procedure.  There is no need for concern and it should clear up in a day or so.  SYMPTOMS TO REPORT IMMEDIATELY:   Following lower endoscopy (colonoscopy or flexible sigmoidoscopy):  Excessive amounts of blood in the stool  Significant tenderness or worsening of abdominal pains  Swelling of the abdomen that is new, acute  Fever of 100F or higher   Following upper endoscopy (EGD)  Vomiting of blood or coffee ground material  New chest pain or pain under the shoulder blades  Painful or persistently difficult swallowing  New shortness of breath  Fever of 100F or higher  Black, tarry-looking stools  For urgent or emergent issues, a gastroenterologist can be reached at any hour by calling [phone]. Do not use MyChart messaging for urgent concerns.    DIET:  We do recommend a small meal at first, but  then you may proceed to your regular diet.  Drink plenty of fluids but you should avoid alcoholic beverages for 24 hours.  ACTIVITY:  You should plan to take it easy for the rest of today and you should NOT DRIVE or use heavy machinery until tomorrow (because of the sedation medicines used during the test).    FOLLOW UP: Our staff will call the number listed on your records 48-72 hours following your procedure to check on you and address any questions or concerns that you may have regarding the information given to you following your procedure. If we do not reach you, we will leave a message.  We will attempt to reach you two times.  During this call, we will ask if you have developed any symptoms of COVID 19. If you develop any symptoms (ie: fever, flu-like symptoms, shortness of breath, cough etc.) before then, please call [phone].  If you test positive for Covid 19 in the 2 weeks post procedure, please call and report this information to [place].    If any biopsies were taken you will be contacted by phone or by letter within the next 1-3 weeks.  Please call us at [phone] if you have not heard about the biopsies in 3 weeks.    SIGNATURES/CONFIDENTIALITY: You and/or your care partner have signed paperwork which will be entered into your electronic medical record.  These signatures attest to the fact that that the information above on  your After Visit Summary has been reviewed and is understood.  Full responsibility of the confidentiality of this discharge information lies with you and/or your care-partner.   No aspirin,ibuprofen,naproxen,or other non-steroidal anti-inflammatory drugs for 2 weeks.,resume plavix in 2 days,resume remainder of medication. Information given on polyps,diverticulosis,hemorrhoids and gastritis.

## 2019-05-24 NOTE — Op Note (Addendum)
[facility] Patient Name: [name] Procedure Date: 05/24/2019 8:00 AM MRN: [id number] Endoscopist: Ladene Artist , MD Age: 44 Referring MD:  Date of Birth: [birth year] Gender: Female Account #: [id number] Procedure:                Colonoscopy Indications:              Unexplained iron deficiency anemia, FIT positive                            stool Medicines:                Monitored Anesthesia Care Procedure:                Pre-Anesthesia Assessment:                           - Prior to the procedure, a History and Physical                            was performed, and patient medications and                            allergies were reviewed. The patient's tolerance of                            previous anesthesia was also reviewed. The risks                            and benefits of the procedure and the sedation                            options and risks were discussed with the patient.                            All questions were answered, and informed consent                            was obtained. Prior Anticoagulants: The patient has                            taken Plavix (clopidogrel), last dose was 5 days                            prior to procedure. ASA Grade Assessment: III - A                            patient with severe systemic disease. After                            reviewing the risks and benefits, the patient was                            deemed in satisfactory condition to undergo the  procedure.                           After obtaining informed consent, the colonoscope                            was passed under direct vision. Throughout the                            procedure, the patient's blood pressure, pulse, and                            oxygen saturations were monitored continuously. The                            Colonoscope was introduced through the anus and                            advanced to  the the terminal ileum, with                            identification of the appendiceal orifice and IC                            valve. The terminal ileum, ileocecal valve,                            appendiceal orifice, and rectum were photographed.                            The quality of the bowel preparation was adequate                            after extensive lavage and suctioning. The                            colonoscopy was performed without difficulty. The                            patient tolerated the procedure well. Scope In: 8:13:53 AM Scope Out: 8:32:27 AM Scope Withdrawal Time: 0 hours 16 minutes 30 seconds  Total Procedure Duration: 0 hours 18 minutes 34 seconds  Findings:                 The perianal and digital rectal examinations were                            normal.                           The terminal ileum appeared normal.                           Two sessile polyps were found in the transverse  colon and ascending colon. The polyps were 8 to 11                            mm in size. These polyps were removed with a hot                            snare. Resection and retrieval were complete.                           A few medium-mouthed diverticula were found in the                            right colon. There was no evidence of diverticular                            bleeding.                           There was evidence of a prior end-to-side                            colo-colonic anastomosis in the recto-sigmoid                            colon. This was patent and was characterized by                            healthy appearing mucosa. The anastomosis was                            traversed.                           Internal hemorrhoids were found during                            retroflexion. The hemorrhoids were small and Grade                            I (internal hemorrhoids that do not prolapse).                            The exam was otherwise without abnormality on                            direct and retroflexion views. Complications:            No immediate complications. Estimated blood loss:                            None. Estimated Blood Loss:     Estimated blood loss: none. Impression:               - The examined portion of the ileum was normal.                           -  Two 8 to 11 mm polyps in the transverse colon and                            in the ascending colon, removed with a hot snare.                            Resected and retrieved.                           - Mild diverticulosis in the right colon. There was                            no evidence of diverticular bleeding.                           - Patent end-to-side colo-colonic anastomosis,                            characterized by healthy appearing mucosa.                           - Internal hemorrhoids.                           - The examination was otherwise normal on direct                            and retroflexion views. Recommendation:           - Repeat colonoscopy, likely in 3 years, for                            surveillance based on pathology results with a more                            extensive bowel prep.                           - Resume Plavix (clopidogrel) in 2 days at prior                            dose. Refer to [facility] for further                            adjustment of therapy.                           - Patient has a contact number available for                            emergencies. The signs and symptoms of potential                            delayed complications were discussed with the  patient. Return to normal activities tomorrow.                            Written discharge instructions were provided to the                            patient.                           - Resume previous diet.                           - Continue present  medications.                           - Await pathology results.                           - No aspirin, ibuprofen, naproxen, or other                            non-steroidal anti-inflammatory drugs for 2 weeks                            after polyp removal. Ladene Artist, MD 05/24/2019 8:46:58 AM This report has been signed electronically.

## 2019-05-24 NOTE — Op Note (Signed)
[place] Patient Name: [name] Procedure Date: 05/24/2019 8:00 AM MRN: [id number] Endoscopist: Ladene Artist , MD Age: 44 Referring MD:  Date of Birth: 1975/09/02 Gender: Female Account #: [id number] Procedure:                Upper GI endoscopy Indications:              Unexplained iron deficiency anemia, Occult blood in                            stool Medicines:                Monitored Anesthesia Care Procedure:                Pre-Anesthesia Assessment:                           - Prior to the procedure, a History and Physical                            was performed, and patient medications and                            allergies were reviewed. The patient's tolerance of                            previous anesthesia was also reviewed. The risks                            and benefits of the procedure and the sedation                            options and risks were discussed with the patient.                            All questions were answered, and informed consent                            was obtained. Prior Anticoagulants: The patient has                            taken Plavix (clopidogrel), last dose was 5 days                            prior to procedure. ASA Grade Assessment: III - A                            patient with severe systemic disease. After                            reviewing the risks and benefits, the patient was                            deemed in satisfactory condition to undergo the  procedure.                           After obtaining informed consent, the endoscope was                            passed under direct vision. Throughout the                            procedure, the patient's blood pressure, pulse, and                            oxygen saturations were monitored continuously. The                            Endoscope was introduced through the mouth, and   advanced to the second part of duodenum. The upper                            GI endoscopy was accomplished without difficulty.                            The patient tolerated the procedure well. Scope In: Scope Out: Findings:                 LA Grade A (one or more mucosal breaks less than 5                            mm, not extending between tops of 2 mucosal folds)                            esophagitis with no bleeding was found at the                            gastroesophageal junction.                           The exam of the esophagus was otherwise normal.                           Patchy mildly erythematous mucosa without bleeding                            was found in the entire examined stomach. Biopsies                            were taken with a cold forceps for histology.                           Evidence of a vertical banded gastroplasty was                            found. A normal appaering gastric sleve was found.  The exam of the stomach was otherwise normal.                           The duodenal bulb and second portion of the                            duodenum were normal. Biopsies for histology were                            taken with a cold forceps for evaluation of celiac                            disease. Complications:            No immediate complications. Estimated Blood Loss:     Estimated blood loss was minimal. Impression:               - LA Grade A reflux esophagitis with no bleeding.                           - Erythematous mucosa in the stomach. Biopsied.                           - Prior gastric sleeve, normal appearing.                           - Normal duodenal bulb and second portion of the                            duodenum. Biopsied. Recommendation:           - Patient has a contact number available for                            emergencies. The signs and symptoms of potential                             delayed complications were discussed with the                            patient. Return to normal activities tomorrow.                            Written discharge instructions were provided to the                            patient.                           - Resume previous diet.                           - Follow antireflux measures long term.                           - Continue present medications.                           -  Await pathology results.                           - Resume Plavix (clopidogrel) at prior dose in 2                            days. Refer to managing physician for further                            adjustment of therapy.                           - Protonix (pantoprazole) 40 mg PO daily, 1 year of                            refills. Ladene Artist, MD 05/24/2019 8:54:20 AM This report has been signed electronically.

## 2019-05-24 NOTE — Progress Notes (Signed)
Called to room to assist during endoscopic procedure.  Patient ID and intended procedure confirmed with present staff. Received instructions for my participation in the procedure from the performing physician.  

## 2019-05-24 NOTE — Progress Notes (Signed)
Pt's states no medical or surgical changes since previsit or office visit.  Temp LC Vitals CW

## 2019-05-24 NOTE — Progress Notes (Signed)
pt tolerated well. VSS. awake and to recovery. Report given to RN. Oral bite block removed with ease. atraumatic

## 2019-05-25 ENCOUNTER — Ambulatory Visit (INDEPENDENT_AMBULATORY_CARE_PROVIDER_SITE_OTHER): Payer: Medicare Other

## 2019-05-25 ENCOUNTER — Other Ambulatory Visit: Payer: Self-pay

## 2019-05-25 DIAGNOSIS — E538 Deficiency of other specified B group vitamins: Secondary | ICD-10-CM | POA: Diagnosis not present

## 2019-05-29 ENCOUNTER — Telehealth: Payer: Self-pay | Admitting: Gastroenterology

## 2019-05-29 ENCOUNTER — Telehealth: Payer: Self-pay

## 2019-05-29 NOTE — Telephone Encounter (Signed)
Patient notified that no acute findings on pathology, but Dr. Fuller Plan will need to review and will send her a letter when he returns next week

## 2019-05-29 NOTE — Telephone Encounter (Signed)
Left message

## 2019-05-29 NOTE — Telephone Encounter (Signed)
Covid-19 screening questions   Do you now or have you had a fever in the last 14 days? No.  Do you have any respiratory symptoms of shortness of breath or cough now or in the last 14 days? No.  Do you have any family members or close contacts with diagnosed or suspected Covid-19 in the past 14 days? No.  Have you been tested for Covid-19 and found to be positive? No.       Follow up Call-  Call back number 05/24/2019  Post procedure Call Back phone  # [phone]  Permission to leave phone message Yes  Some recent data might be hidden     Patient questions:  Do you have a fever, pain , or abdominal swelling? No. Pain Score  0 *  Have you tolerated food without any problems? Yes.    Have you been able to return to your normal activities? Yes.    Do you have any questions about your discharge instructions: Diet   No. Medications  No. Follow up visit  No.  Do you have questions or concerns about your Care? No.  Actions: * If pain score is 4 or above: No action needed, pain <4.

## 2019-06-02 ENCOUNTER — Ambulatory Visit: Payer: Self-pay | Admitting: Family Medicine

## 2019-06-05 ENCOUNTER — Encounter: Payer: Self-pay | Admitting: Gastroenterology

## 2019-06-06 ENCOUNTER — Encounter (HOSPITAL_COMMUNITY): Payer: Self-pay

## 2019-06-06 NOTE — Patient Instructions (Addendum)
DUE TO COVID-19 ONLY TWO VISITORS ARE ALLOWED TO COME WITH YOU AND STAY IN THE WAITING ROOM ONLY DURING PRE OP AND PROCEDURE. THE TWO VISITORS MAY VISIT WITH YOU IN YOUR PRIVATE ROOM DURING VISITING HOURS ONLY!!   COVID SWAB TESTING MUST BE COMPLETED ON: Tuesday, June 20, 2019 at 10:00 AM [address], [place] -Former [facility] enter pre surgical testing line (Must self quarantine after testing. Follow instructions on handout.)             Your procedure is scheduled on: Friday, June 23, 2019   Report to [facility] Main  Entrance    Report to admitting at 12:30 PM   Call this number if you have problems the morning of surgery [phone]   Do not eat food:After Midnight.   May have liquids until 11:30 AM day of surgery   CLEAR LIQUID DIET  Foods Allowed                                                                     Foods Excluded  Water, Black Coffee and tea, regular and decaf                             liquids that you cannot  Plain Jell-O in any flavor  (No red)                                           see through such as: Fruit ices (not with fruit pulp)                                     milk, soups, orange juice  Iced Popsicles (No red)                                    All solid food Carbonated beverages, regular and diet                                    Apple juices Sports drinks like Gatorade (No red) Lightly seasoned clear broth or consume(fat free) Sugar, honey syrup  Sample Menu Breakfast                                Lunch                                     Supper Cranberry juice                    Beef broth                            Chicken broth Jell-O  Grape juice                           Apple juice Coffee or tea                        Jell-O                                      Popsicle                                                Coffee or tea                        Coffee or  tea    Oral Hygiene is also important to reduce your risk of infection.                                    Remember - BRUSH YOUR TEETH THE MORNING OF SURGERY WITH YOUR REGULAR TOOTHPASTE   Do NOT smoke after Midnight   Take these medicines the morning of surgery with A SIP OF WATER: Atenolol, Atorvastatin, Gabapentin, Lorazepam, Pantoprazole,                               You may not have any metal on your body including hair pins, jewelry, and body piercings             Do not wear make-up, lotions, powders, perfumes/cologne, or deodorant             Do not wear nail polish.  Do not shave  48 hours prior to surgery.                Do not bring valuables to the hospital. [place].   Contacts, dentures or bridgework may not be worn into surgery.   Bring small overnight bag day of surgery.    Patients discharged the day of surgery will not be allowed to drive home.   Special Instructions: Bring a copy of your healthcare power of attorney and living will documents         the day of surgery if you haven't scanned them in before.              Please read over the following fact sheets you were given: IF YOU HAVE QUESTIONS ABOUT YOUR PRE OP INSTRUCTIONS PLEASE CALL [phone]   [place] - Preparing for Surgery Before surgery, you can play an important role.  Because skin is not sterile, your skin needs to be as free of germs as possible.  You can reduce the number of germs on your skin by washing with CHG (chlorahexidine gluconate) soap before surgery.  CHG is an antiseptic cleaner which kills germs and bonds with the skin to continue killing germs even after washing. Please DO NOT use if you have an allergy to CHG or antibacterial soaps.  If your skin becomes reddened/irritated stop  using the CHG and inform your nurse when you arrive at Short Stay. Do not shave (including legs and underarms) for at least 48 hours prior to the first CHG  shower.  You may shave your face/neck.  Please follow these instructions carefully:  1.  Shower with CHG Soap the night before surgery and the  morning of surgery.  2.  If you choose to wash your hair, wash your hair first as usual with your normal  shampoo.  3.  After you shampoo, rinse your hair and body thoroughly to remove the shampoo.                             4.  Use CHG as you would any other liquid soap.  You can apply chg directly to the skin and wash.  Gently with a scrungie or clean washcloth.  5.  Apply the CHG Soap to your body ONLY FROM THE NECK DOWN.   Do   not use on face/ open                           Wound or open sores. Avoid contact with eyes, ears mouth and   genitals (private parts).                       Wash face,  Genitals (private parts) with your normal soap.             6.  Wash thoroughly, paying special attention to the area where your    surgery  will be performed.  7.  Thoroughly rinse your body with warm water from the neck down.  8.  DO NOT shower/wash with your normal soap after using and rinsing off the CHG Soap.                9.  Pat yourself dry with a clean towel.            10.  Wear clean pajamas.            11.  Place clean sheets on your bed the night of your first shower and do not  sleep with pets. Day of Surgery : Do not apply any lotions/deodorants the morning of surgery.  Please wear clean clothes to the hospital/surgery center.  FAILURE TO FOLLOW THESE INSTRUCTIONS MAY RESULT IN THE CANCELLATION OF YOUR SURGERY  PATIENT SIGNATURE_________________________________  NURSE SIGNATURE__________________________________  ________________________________________________________________________

## 2019-06-09 ENCOUNTER — Ambulatory Visit: Payer: Medicare Other | Admitting: Family Medicine

## 2019-06-10 ENCOUNTER — Encounter: Payer: Self-pay | Admitting: Primary Care

## 2019-06-10 NOTE — Assessment & Plan Note (Addendum)
-   Pulmonary function testing showed no evidence of obstructive lung disease - Recommend patient stay active and work on weight loss, if she experiences further episodes of shortness of breath/chest pain or cough instructed to notify office - Follow-up 6 months with Dr. Lamonte Sakai

## 2019-06-10 NOTE — Assessment & Plan Note (Addendum)
-   No active symptoms; cough/dyspnea resolved with 14 day prednisone taper in March 2021 - Pulmonary nodular disease, mediastinal and hilar adenopathy on CT imaging; granulomatous inflammation on cytology along with elevated ACE level are consistent with sarcoidosis  - PFTs 05/14/19 showed mild restriction, normal diffusion capacity

## 2019-06-10 NOTE — Assessment & Plan Note (Deleted)
-   No active shortness of breath or cough - Pulmonary function testing showed no evidence of obstructive lung disease - Recommend patient stay active and work on weight loss, if she experiences further episodes of shortness of breath/chest pain or cough instructed to notify office - Follow-up 6 months with Dr. Lamonte Sakai

## 2019-06-12 ENCOUNTER — Encounter (HOSPITAL_COMMUNITY)
Admission: RE | Admit: 2019-06-12 | Discharge: 2019-06-12 | Disposition: A | Discharge status: Home / Self Care | Payer: Medicare Other | Source: Ambulatory Visit | Attending: General Surgery | Admitting: General Surgery

## 2019-06-12 ENCOUNTER — Encounter (HOSPITAL_COMMUNITY): Payer: Self-pay

## 2019-06-12 ENCOUNTER — Other Ambulatory Visit: Payer: Self-pay

## 2019-06-12 DIAGNOSIS — Z01818 Encounter for other preprocedural examination: Secondary | ICD-10-CM | POA: Diagnosis not present

## 2019-06-12 HISTORY — DX: Fatty (change of) liver, not elsewhere classified: K76.0

## 2019-06-12 HISTORY — DX: Obesity, unspecified: E66.9

## 2019-06-12 HISTORY — DX: Iron deficiency anemia, unspecified: D50.9

## 2019-06-12 HISTORY — DX: Obsessive-compulsive disorder, unspecified: F42.9

## 2019-06-12 HISTORY — DX: Anal fistula: K60.3

## 2019-06-12 HISTORY — DX: Attention-deficit hyperactivity disorder, unspecified type: F90.9

## 2019-06-12 HISTORY — DX: Personal history of colon polyps, unspecified: Z86.0100

## 2019-06-12 HISTORY — DX: Calculus of gallbladder without cholecystitis without obstruction: K80.20

## 2019-06-12 HISTORY — DX: Herpesviral infection, unspecified: B00.9

## 2019-06-12 HISTORY — DX: Anal fistula, unspecified: K60.30

## 2019-06-12 HISTORY — DX: Sarcoidosis, unspecified: D86.9

## 2019-06-12 HISTORY — DX: Other nonspecific abnormal finding of lung field: R91.8

## 2019-06-12 HISTORY — DX: Personal history of colonic polyps: Z86.010

## 2019-06-12 LAB — CBC WITH DIFFERENTIAL/PLATELET
Abs Immature Granulocytes: 0.02 10*3/uL (ref 0.00–0.07)
Basophils Absolute: 0 10*3/uL (ref 0.0–0.1)
Basophils Relative: 0 %
Eosinophils Absolute: 0.2 10*3/uL (ref 0.0–0.5)
Eosinophils Relative: 3 %
HCT: 38.4 % (ref 36.0–46.0)
Hemoglobin: 12 g/dL (ref 12.0–15.0)
Immature Granulocytes: 0 %
Lymphocytes Relative: 18 %
Lymphs Abs: 1.3 10*3/uL (ref 0.7–4.0)
MCH: 27.6 pg (ref 26.0–34.0)
MCHC: 31.3 g/dL (ref 30.0–36.0)
MCV: 88.3 fL (ref 80.0–100.0)
Monocytes Absolute: 0.7 10*3/uL (ref 0.1–1.0)
Monocytes Relative: 10 %
Neutro Abs: 4.9 10*3/uL (ref 1.7–7.7)
Neutrophils Relative %: 69 %
Platelets: 271 10*3/uL (ref 150–400)
RBC: 4.35 MIL/uL (ref 3.87–5.11)
RDW: 13.4 % (ref 11.5–15.5)
WBC: 7.1 10*3/uL (ref 4.0–10.5)
nRBC: 0 % (ref 0.0–0.2)

## 2019-06-12 LAB — COMPREHENSIVE METABOLIC PANEL
ALT: 23 U/L (ref 0–44)
AST: 17 U/L (ref 15–41)
Albumin: 3.9 g/dL (ref 3.5–5.0)
Alkaline Phosphatase: 106 U/L (ref 38–126)
Anion gap: 9 (ref 5–15)
BUN: 10 mg/dL (ref 6–20)
CO2: 23 mmol/L (ref 22–32)
Calcium: 8.8 mg/dL — ABNORMAL LOW (ref 8.9–10.3)
Chloride: 108 mmol/L (ref 98–111)
Creatinine, Ser: 0.61 mg/dL (ref 0.44–1.00)
GFR calc Af Amer: >60 mL/min (ref >60)
GFR calc non Af Amer: >60 mL/min (ref >60)
Glucose, Bld: 98 mg/dL (ref 70–99)
Potassium: 3.6 mmol/L (ref 3.5–5.1)
Sodium: 140 mmol/L (ref 135–145)
Total Bilirubin: 0.2 mg/dL — ABNORMAL LOW (ref 0.3–1.2)
Total Protein: 6.9 g/dL (ref 6.5–8.1)

## 2019-06-12 NOTE — Progress Notes (Signed)
PCP - Dr. Dennard Schaumann, [facility] Medicine Cardiologist - N/A  Chest x-ray - 04/26/19 in epic EKG - 02/27/19 in epic Stress Test - N/A ECHO - N/A Cardiac Cath - N/A  Sleep Study - 11/04/18 in epic CPAP - No  Fasting Blood Sugar - N/A Checks Blood Sugar __N/A___ times a day  Blood Thinner Instructions: Plavix stop 5 days prior Aspirin Instructions: N/A Last Dose: N/A  Anesthesia review:  OSA, CVA, BMI 43.9  Patient denies shortness of breath, fever, cough and chest pain at PAT appointment   Patient verbalized understanding of instructions that were given to them at the PAT appointment. Patient was also instructed that they will need to review over the PAT instructions again at home before surgery.

## 2019-06-14 ENCOUNTER — Encounter (HOSPITAL_COMMUNITY): Payer: Self-pay | Admitting: Certified Registered Nurse Anesthetist

## 2019-06-14 ENCOUNTER — Encounter (HOSPITAL_COMMUNITY): Payer: Self-pay | Admitting: Physician Assistant

## 2019-06-14 NOTE — Progress Notes (Signed)
Anesthesia Chart Review   Case: [id number] Date/Time: 06/23/19 1415   Procedures:      DIAGNOSTIC LAPAROSCOPY OF CHRONIC ABDOMINAL WALL SEROMA (N/A )     INCISION AND DRAINAGE, INJECTION OF SCLEROSING AGENT (N/A )   Anesthesia type: General   Pre-op diagnosis: CHRONIC ABDOMINAL WALL SEROMA   [facility] ROOM 01 / [facility]   Surgeons: Greer Pickerel, MD      DISCUSSION:43 y.o. former smoker with h/o Bipolar affective disorder, schizophrenia, OCD, CVA, asthma, sleep apnea, sarcoidosis, GERD, chronic abdominal wall seroma scheduled for above procedure 06/23/2019 with Dr. Greer Pickerel.   Pt last seen by pulmonologist 05/18/2019.  Per OV note no breathing issues at that time, using rescue inhaler once every 2 weeks.  Sarcoidosis with no active symptoms, PFT with no evidence of obstructive lung disease.  6 month follow up recommended.   S/p diagnostic laparoscopy 04/11/2019 with general anesthesia with no anesthesia complications.  Per anesthesia note, "Doesn't tolerate tylenol well, taking large dose antipsychotic medicaiton in addition to frequent zofran, phenergan, doesn't want scopolamine patch given risk of drowsiness."  Anticipate pt can proceed with planned procedure barring acute status change.   VS: BP 123/89 (BP Location: Right Arm)   Temp 36.9 C (Oral)   Resp 18   Ht '5\' 5"'$  (1.651 m)   Wt 117.3 kg   LMP 05/17/2019 (Exact Date)   SpO2 99%   BMI 43.02 kg/m   PROVIDERS: Susy Frizzle, MD is PCP    LABS: Labs reviewed: Acceptable for surgery. (all labs ordered are listed, but only abnormal results are displayed)  Labs Reviewed  COMPREHENSIVE METABOLIC PANEL - Abnormal; Notable for the following components:      Result Value   Calcium 8.8 (*)    Total Bilirubin 0.2 (*)    All other components within normal limits  CBC WITH DIFFERENTIAL/PLATELET     IMAGES:   EKG: 06/12/2019  Rate 85 bpm  Normal sinus rhythm  Low voltage QRS Nonspecific ST and T wave abnormality   Since last tracing rate slower   CV:  Past Medical History:  Diagnosis Date  . ADHD   . Anal fistula   . Anxiety   . Arthritis   . Asthma   . Bipolar affective ([place])   . Blood transfusion without reported diagnosis   . Colostomy in place [facility])   . CVA (cerebrovascular accident) ([place])    left cerebellar infarct found accidentally on CT (2020)   . Depression   . Diverticulitis 2008   perforated/ requiring resection  . Family history of adverse reaction to anesthesia    MGM was placed on vent after surgery  . Fatty liver   . Gallstones   . GERD (gastroesophageal reflux disease)    occ.  Marland Kitchen Headache    after a fall  . Herpes simplex   . History of colon polyps   . History of hiatal hernia    small  . History of kidney stones    cystoscopy  basket removal  . Iron deficiency anemia   . Obese   . OCD (obsessive compulsive disorder)   . Panic attacks   . Pulmonary nodules   . Sarcoidosis   . Schizophrenia ([place])   . Shortness of breath dyspnea   . Sleep apnea    mild, does not use c-pap machine  . Substance abuse ([place])    12 years ago-crack cocaine    Past Surgical History:  Procedure Laterality Date  .  Abdominal wall hernia  07/2007   open repair with lysis of adhesions  . BRONCHIAL BRUSHINGS  04/11/2019   Procedure: BRONCHIAL BRUSHINGS;  Surgeon: Collene Gobble, MD;  Location: [facility] ENDOSCOPY;  Service: Pulmonary;;  . BRONCHIAL WASHINGS  04/11/2019   Procedure: BRONCHIAL WASHINGS;  Surgeon: Collene Gobble, MD;  Location: [facility] ENDOSCOPY;  Service: Pulmonary;;  . COLONOSCOPY  03/13/2010   WHQ:PRFFMBWG hemorrhoids likely cause of hematochezia, otherwise normal  . COLONOSCOPY  05/24/2019  . COLONOSCOPY WITH ESOPHAGOGASTRODUODENOSCOPY (EGD)  04/2019  . Colostomy reversal  11/2006  . ESOPHAGOGASTRODUODENOSCOPY  03/13/2010   YKZ:[id number] esophagus, status post passage of a Maloney dilator/Small hiatal hernia/ Antral erosions, status post biopsy  . EXAMINATION UNDER  ANESTHESIA N/A 05/11/2012   Procedure: EXAM UNDER ANESTHESIA;  Surgeon: Donato Heinz, MD;  Location[facility];  Service: General;  Laterality: N/A;  . Exploratory laparotomy with resection  2008   colonoscopy  . FINE NEEDLE ASPIRATION  04/11/2019   Procedure: FINE NEEDLE ASPIRATION (FNA) LINEAR;  Surgeon: Collene Gobble, MD;  Location: [place] ENDOSCOPY;  Service: Pulmonary;;  . FINGER CLOSED REDUCTION Right 08/05/2012   Procedure: CLOSED REDUCTION RIGHT THUMB (FINGER);  Surgeon: Linna Hoff, MD;  Location: [place];  Service: Orthopedics;  Laterality: Right;  . Hx of abd wall seroma  08/2007   Drained via [place] in [place], [place]  . Hx of abd wall seroma  10/2007   Drained by Dr. Geroge Baseman in office  . IR RADIOLOGIST EVAL & MGMT  03/15/2019  . IR RADIOLOGIST EVAL & MGMT  04/06/2019  . Kidney stones    . LAPAROSCOPIC GASTRIC SLEEVE RESECTION N/A 02/13/2014   Procedure: LAPAROSCOPIC GASTRIC SLEEVE RESECTION LYSIS OF ADHESIONS, UPPER ENDOSCOPY;  Surgeon: [name], MD;  [facility];  Service: General;  Laterality: N/A;  . LAPAROSCOPY N/A 09/28/2014   Procedure: LAPAROSCOPY DIAGNOSTIC, INCISION AND DRAINAGE WITH LAPAROSCOPIC EXPLORATION OF ABDOMINAL WALL SEROMA with ultrasound;  Surgeon: Greer Pickerel, MD;  Location[facility];  Service: General;  Laterality: N/A;  . LUNG BIOPSY  04/11/2019   Procedure: LUNG BIOPSY;  Surgeon: Collene Gobble, MD;  Location: [facility] ENDOSCOPY;  Service: Pulmonary;;  distal trachea  . PLACEMENT OF SETON N/A 05/11/2012   Procedure: PLACEMENT OF SETON;  Surgeon: Donato Heinz, MD;  Location: [facility];  Service: General;  Laterality: N/A;  . root canal 7-16    . TONSILLECTOMY    . TREATMENT FISTULA ANAL     x 2  . UPPER GASTROINTESTINAL ENDOSCOPY  05/24/2019  . VIDEO BRONCHOSCOPY WITH ENDOBRONCHIAL ULTRASOUND N/A 04/11/2019   Procedure: VIDEO BRONCHOSCOPY WITH ENDOBRONCHIAL ULTRASOUND;  Surgeon: Collene Gobble, MD;  Location: [facility] ENDOSCOPY;  Service: Pulmonary;  Laterality: N/A;     MEDICATIONS: . acetaminophen (TYLENOL) 500 MG tablet  . amphetamine-dextroamphetamine (ADDERALL) 30 MG tablet  . atenolol (TENORMIN) 25 MG tablet  . atorvastatin (LIPITOR) 40 MG tablet  . benztropine (COGENTIN) 2 MG tablet  . calcium carbonate (OS-CAL - DOSED IN MG OF ELEMENTAL CALCIUM) 1250 (500 Ca) MG tablet  . Cholecalciferol (VITAMIN D) 50 MCG (2000 UT) tablet  . clopidogrel (PLAVIX) 75 MG tablet  . Cyanocobalamin (B-12 COMPLIANCE INJECTION) 1000 MCG/ML KIT  . ferrous sulfate 325 (65 FE) MG tablet  . fexofenadine (ALLEGRA) 180 MG tablet  . gabapentin (NEURONTIN) 300 MG capsule  . haloperidol (HALDOL) 2 MG tablet  . LORazepam (ATIVAN) 1 MG tablet  . Multiple Vitamins-Minerals (MULTIVITAMIN WITH MINERALS) tablet  . NUVIGIL 250 MG  tablet  . ondansetron (ZOFRAN ODT) 4 MG disintegrating tablet  . pantoprazole (PROTONIX) 40 MG tablet  . QUEtiapine (SEROQUEL) 300 MG tablet  . topiramate (TOPAMAX) 100 MG tablet  . valACYclovir (VALTREX) 500 MG tablet   . cyanocobalamin ((VITAMIN B-12)) injection 1,000 mcg    Maia Plan [facility] Pre-Surgical Testing [phone] 06/14/19  2:52 PM

## 2019-06-15 ENCOUNTER — Other Ambulatory Visit: Payer: Self-pay

## 2019-06-15 ENCOUNTER — Encounter: Payer: Medicare Other | Attending: General Surgery | Admitting: Skilled Nursing Facility1

## 2019-06-15 DIAGNOSIS — E559 Vitamin D deficiency, unspecified: Secondary | ICD-10-CM | POA: Insufficient documentation

## 2019-06-15 DIAGNOSIS — Z9884 Bariatric surgery status: Secondary | ICD-10-CM | POA: Diagnosis not present

## 2019-06-15 DIAGNOSIS — Z713 Dietary counseling and surveillance: Secondary | ICD-10-CM | POA: Diagnosis not present

## 2019-06-15 DIAGNOSIS — E669 Obesity, unspecified: Secondary | ICD-10-CM | POA: Insufficient documentation

## 2019-06-15 DIAGNOSIS — Z8673 Personal history of transient ischemic attack (TIA), and cerebral infarction without residual deficits: Secondary | ICD-10-CM | POA: Insufficient documentation

## 2019-06-15 DIAGNOSIS — K912 Postsurgical malabsorption, not elsewhere classified: Secondary | ICD-10-CM | POA: Diagnosis not present

## 2019-06-15 DIAGNOSIS — Z6841 Body Mass Index (BMI) 40.0 and over, adult: Secondary | ICD-10-CM | POA: Diagnosis not present

## 2019-06-15 DIAGNOSIS — D508 Other iron deficiency anemias: Secondary | ICD-10-CM | POA: Insufficient documentation

## 2019-06-15 NOTE — Progress Notes (Signed)
Bariatric Nutrition Follow-Up Visit Medical Nutrition Therapy  Appt Start Time: 10:00a   End Time: 10:30a  NUTRITION ASSESSMENT Anthropometrics  Start weight at [facility]: 315 lbs (date: 11/06/2013) Today's weight: 254 lbs Weight change:  lbs (since previous nutrition visit)  Clinical  Medical hx: bipolar, anxiety Medications: see list Labs:    Lifestyle & Dietary Hx  [name] is making some great changes. Pt states her surgery is next week to take care of seroma. Pt states she knows it is going to hurt bad and take a long time to recover. Pt state she remembers he had to take narcotics for a year due to the pain. Pt state she makes her own strawberry tea with splenda and splenda on strawberries and not eating out as much. Pt state she still buys her son fast food but is able to to not eat it. Pt states she tries to keep herself full with liquids to keep her from eating throughout the day. Pt states her drain smells like 3 day old pinto beans.  Pt states sometimes she will get a sugary tea but less often than before.  Pt states she has only eaten the whole bag of pork skins twice stating it is not the big one: Dietitian advised pt that is too many pork rinds and it is a big bag. Pt state she was also able to only eat 2 slices of pizza. Pt states she loves cheese on everything. Pt state her granddaughter annoys her so she eats in her bed. Pt states salad make her have diarrhea. Pt states it is too weird to have non starchy vegetables with most of her foods and laughed.   Body Composition Scale 06/15/2019  Current Body Weight 254.8  Total Body Fat % 46.1  Visceral Fat 17  Fat-Free Mass % 53.8   Total Body Water % 41.4  Muscle-Mass lbs 31.3  BMI 42.1  Body Fat Displacement          Torso  lbs 72.9         Left Leg  lbs 14.5         Right Leg  lbs 14.5         Left Arm  lbs 7.2         Right Arm   lbs 7.2    Estimated daily fluid intake:   Estimated daily protein intake:  Supplements:  Iron, B12 shot monthly, MV with Iron, Calcium, Thiamin Current average weekly physical activity: None reported  24-Hr Dietary Recall First Meal: Strawberry Iced tea Snack: Salad w cucumbers and 1,000 island dressing, [place] red dressing Second Meal: lean cuisine or ham sandwich with pork skins and 3 sugar free cookies Snack: Third Meal: Cup full of spaghetti, handful of M&Ms or fast food Snack:  Beverages: sugar free Strawberry Iced Tea 16 oz,  Diet Coke 20 oz, water with flavoring with strawberries with splenda on it   Post-Op Goals/ Signs/ Symptoms Using straws: no Drinking while eating: no Chewing/swallowing difficulties: no Changes in vision: no Changes to mood/headaches: no Hair loss/changes to skin/nails: no Difficulty focusing/concentrating: no Sweating: no Dizziness/lightheadedness: no Palpitations: no  Carbonated/caffeinated beverages: no N/V/D/C/Gas: no Abdominal pain: no Dumping syndrome: no    NUTRITION DIAGNOSIS  Overweight/obesity (-3.3) related to past poor dietary habits and physical inactivity as evidenced by completed bariatric surgery and following dietary guidelines for continued weight loss and healthy nutrition status.     NUTRITION INTERVENTION Nutrition counseling (C-1) and education (E-2) to facilitate bariatric  surgery goals, including: . The importance of consuming adequate calories as well as certain nutrients daily due to the body's need for essential vitamins, minerals, and fats . The importance of daily physical activity and to reach a goal of at least 150 minutes of moderate to vigorous physical activity weekly (or as directed by their physician) due to benefits such as increased musculature and improved lab values  Goals: -Continue: Eat healthier fast food options for 2-3 meals.(using handout) -Continue: Reduce dessert intake, saying 'no' to your mom's dessert. -Continue to NOT eat bread with your meal -Continue to limit fast  food -Continue to avoid your [place] tea -Continue to grocery shop at least every other week -Continue: do not eat pork rinds daily and do not eat the entire bag -continue: 1 yogurt at night would be a fine snack -Continue: Do NOT eat anything in your bed even if you think of it as healthy -NEW: be sure to have non starchy vegetables your pizza and fish meal  Handouts Provided Include   N/A  Learning Style & Readiness for Change Teaching method utilized: Visual & Auditory  Demonstrated degree of understanding via: Teach Back  Barriers to learning/adherence to lifestyle change: Resistance to change  RD's Notes for Next Visit . Assess adherence to pt chosen goals . Revisit barriers to success    MONITORING & EVALUATION Dietary intake, weekly physical activity, body weight.  Next Steps Patient is to follow-up in 1 month.

## 2019-06-20 ENCOUNTER — Other Ambulatory Visit (HOSPITAL_COMMUNITY)
Admission: RE | Admit: 2019-06-20 | Discharge: 2019-06-20 | Disposition: A | Discharge status: Home / Self Care | Payer: Medicare Other | Source: Ambulatory Visit | Attending: General Surgery | Admitting: General Surgery

## 2019-06-20 DIAGNOSIS — Z01812 Encounter for preprocedural laboratory examination: Secondary | ICD-10-CM | POA: Insufficient documentation

## 2019-06-20 DIAGNOSIS — Z20822 Contact with and (suspected) exposure to covid-19: Secondary | ICD-10-CM | POA: Diagnosis not present

## 2019-06-20 LAB — SARS CORONAVIRUS 2 (TAT 6-24 HRS): SARS Coronavirus 2: NEGATIVE

## 2019-06-22 MED ORDER — BUPIVACAINE LIPOSOME 1.3 % IJ SUSP
20.0000 mL | Freq: Once | INTRAMUSCULAR | Status: DC
  Filled 2019-06-22: qty 20

## 2019-06-23 ENCOUNTER — Ambulatory Visit (HOSPITAL_COMMUNITY)
Admission: RE | Admit: 2019-06-23 | Discharge: 2019-06-23 | Disposition: A | Discharge status: Home / Self Care | Payer: Medicare Other | Attending: General Surgery | Admitting: General Surgery

## 2019-06-23 ENCOUNTER — Encounter (HOSPITAL_COMMUNITY): Payer: Self-pay | Admitting: General Surgery

## 2019-06-23 ENCOUNTER — Other Ambulatory Visit: Payer: Self-pay

## 2019-06-23 ENCOUNTER — Ambulatory Visit (HOSPITAL_COMMUNITY): Payer: Medicare Other

## 2019-06-23 ENCOUNTER — Encounter (HOSPITAL_COMMUNITY)
Admission: RE | Disposition: A | Discharge status: Home / Self Care | Payer: Self-pay | Source: Home / Self Care | Attending: General Surgery

## 2019-06-23 ENCOUNTER — Telehealth (HOSPITAL_COMMUNITY): Payer: Self-pay | Admitting: *Deleted

## 2019-06-23 DIAGNOSIS — Z9884 Bariatric surgery status: Secondary | ICD-10-CM | POA: Insufficient documentation

## 2019-06-23 DIAGNOSIS — Z87891 Personal history of nicotine dependence: Secondary | ICD-10-CM | POA: Insufficient documentation

## 2019-06-23 DIAGNOSIS — Z6839 Body mass index (BMI) 39.0-39.9, adult: Secondary | ICD-10-CM | POA: Diagnosis not present

## 2019-06-23 DIAGNOSIS — F319 Bipolar disorder, unspecified: Secondary | ICD-10-CM | POA: Insufficient documentation

## 2019-06-23 DIAGNOSIS — E559 Vitamin D deficiency, unspecified: Secondary | ICD-10-CM | POA: Diagnosis not present

## 2019-06-23 DIAGNOSIS — Z8673 Personal history of transient ischemic attack (TIA), and cerebral infarction without residual deficits: Secondary | ICD-10-CM | POA: Diagnosis not present

## 2019-06-23 DIAGNOSIS — R918 Other nonspecific abnormal finding of lung field: Secondary | ICD-10-CM | POA: Diagnosis not present

## 2019-06-23 DIAGNOSIS — L988 Other specified disorders of the skin and subcutaneous tissue: Secondary | ICD-10-CM

## 2019-06-23 DIAGNOSIS — L7634 Postprocedural seroma of skin and subcutaneous tissue following other procedure: Secondary | ICD-10-CM | POA: Diagnosis not present

## 2019-06-23 DIAGNOSIS — D508 Other iron deficiency anemias: Secondary | ICD-10-CM | POA: Insufficient documentation

## 2019-06-23 DIAGNOSIS — N289 Disorder of kidney and ureter, unspecified: Secondary | ICD-10-CM | POA: Diagnosis not present

## 2019-06-23 DIAGNOSIS — Z8261 Family history of arthritis: Secondary | ICD-10-CM | POA: Diagnosis not present

## 2019-06-23 DIAGNOSIS — L02211 Cutaneous abscess of abdominal wall: Secondary | ICD-10-CM | POA: Diagnosis not present

## 2019-06-23 DIAGNOSIS — Z9119 Patient's noncompliance with other medical treatment and regimen: Secondary | ICD-10-CM | POA: Diagnosis not present

## 2019-06-23 DIAGNOSIS — E46 Unspecified protein-calorie malnutrition: Secondary | ICD-10-CM | POA: Diagnosis not present

## 2019-06-23 DIAGNOSIS — M1990 Unspecified osteoarthritis, unspecified site: Secondary | ICD-10-CM | POA: Insufficient documentation

## 2019-06-23 DIAGNOSIS — K8020 Calculus of gallbladder without cholecystitis without obstruction: Secondary | ICD-10-CM | POA: Insufficient documentation

## 2019-06-23 LAB — PREGNANCY, URINE: Preg Test, Ur: NEGATIVE

## 2019-06-23 SURGERY — LAPAROSCOPY, DIAGNOSTIC
Anesthesia: General

## 2019-06-23 MED ORDER — IOHEXOL 300 MG/ML  SOLN
100.0000 mL | Freq: Once | INTRAMUSCULAR | Status: AC | PRN
  Administered 2019-06-23: 100 mL

## 2019-06-23 MED ORDER — CHLORHEXIDINE GLUCONATE CLOTH 2 % EX PADS: 6.0000 | MEDICATED_PAD | Freq: Once | CUTANEOUS | Status: DC

## 2019-06-23 MED ORDER — IOHEXOL 9 MG/ML PO SOLN
ORAL | Status: DC
Start: 2019-06-23 — End: 2019-06-24
  Filled 2019-06-23: qty 1000

## 2019-06-23 MED ORDER — FENTANYL CITRATE (PF) 100 MCG/2ML IJ SOLN
INTRAMUSCULAR | Status: AC
  Filled 2019-06-23: qty 2

## 2019-06-23 MED ORDER — SUCCINYLCHOLINE CHLORIDE 200 MG/10ML IV SOSY
PREFILLED_SYRINGE | INTRAVENOUS | Status: AC
  Filled 2019-06-23: qty 10

## 2019-06-23 MED ORDER — CEFAZOLIN SODIUM-DEXTROSE 2-4 GM/100ML-% IV SOLN
2.0000 g | INTRAVENOUS | Status: DC
  Filled 2019-06-23: qty 100

## 2019-06-23 MED ORDER — MORPHINE SULFATE (PF) 4 MG/ML IV SOLN
0.5000 mg | Freq: Once | INTRAVENOUS | Status: AC
  Administered 2019-06-23: 0.52 mg via INTRAVENOUS

## 2019-06-23 MED ORDER — LIDOCAINE 2% (20 MG/ML) 5 ML SYRINGE
INTRAMUSCULAR | Status: AC
  Filled 2019-06-23: qty 5

## 2019-06-23 MED ORDER — IOHEXOL 300 MG/ML  SOLN
100.0000 mL | Freq: Once | INTRAMUSCULAR | Status: AC | PRN
  Administered 2019-06-23: 100 mL via INTRAVENOUS

## 2019-06-23 MED ORDER — PROPOFOL 10 MG/ML IV BOLUS
INTRAVENOUS | Status: AC
  Filled 2019-06-23: qty 20

## 2019-06-23 MED ORDER — SODIUM CHLORIDE (PF) 0.9 % IJ SOLN
INTRAMUSCULAR | Status: DC
Start: 2019-06-23 — End: 2019-06-24
  Filled 2019-06-23: qty 50

## 2019-06-23 MED ORDER — DEXAMETHASONE SODIUM PHOSPHATE 10 MG/ML IJ SOLN
INTRAMUSCULAR | Status: AC
  Filled 2019-06-23: qty 1

## 2019-06-23 MED ORDER — ONDANSETRON HCL 4 MG/2ML IJ SOLN
INTRAMUSCULAR | Status: AC
  Filled 2019-06-23: qty 2

## 2019-06-23 MED ORDER — ROCURONIUM BROMIDE 10 MG/ML (PF) SYRINGE
PREFILLED_SYRINGE | INTRAVENOUS | Status: AC
  Filled 2019-06-23: qty 10

## 2019-06-23 MED ORDER — MORPHINE SULFATE (PF) 4 MG/ML IV SOLN
INTRAVENOUS | Status: AC
  Filled 2019-06-23: qty 1

## 2019-06-23 MED ORDER — LACTATED RINGERS IV SOLN: INTRAVENOUS | Status: DC

## 2019-06-23 MED ORDER — MIDAZOLAM HCL 2 MG/2ML IJ SOLN
INTRAMUSCULAR | Status: AC
  Filled 2019-06-23: qty 2

## 2019-06-23 MED ORDER — ACETAMINOPHEN 500 MG PO TABS
1000.0000 mg | ORAL_TABLET | ORAL | Status: DC
  Filled 2019-06-23: qty 2

## 2019-06-23 MED ORDER — FENTANYL CITRATE (PF) 250 MCG/5ML IJ SOLN
INTRAMUSCULAR | Status: AC
  Filled 2019-06-23: qty 5

## 2019-06-23 MED ORDER — PHENYLEPHRINE HCL (PRESSORS) 10 MG/ML IV SOLN
INTRAVENOUS | Status: AC
  Filled 2019-06-23: qty 1

## 2019-06-23 NOTE — Progress Notes (Signed)
1415 to Radiology for CT.  [name] aware of same. [place] mother [name]) and informed that pt. Will be discharged to home; and she could call once she arrives at [facility].

## 2019-06-23 NOTE — Progress Notes (Signed)
[name] in to see patient. Patient shared that she "smells are coming from abd. And she is fearful that something is wrong.  Noticeable fluid/ possible fecal material in drainage.   [name] stated that today surgery will be cancelled and an emergent CT scan will be completed to provide more information of abd distention and pain.

## 2019-06-23 NOTE — Anesthesia Preprocedure Evaluation (Deleted)
Anesthesia Evaluation    Airway        Dental   Pulmonary former smoker,           Cardiovascular hypertension, Pt. on medications and Pt. on home beta blockers      Neuro/Psych  Headaches, Anxiety Depression Bipolar Disorder Schizophrenia CVA, No Residual Symptoms    GI/Hepatic hiatal hernia, GERD  Medicated,  Endo/Other    Renal/GU      Musculoskeletal  (+) Arthritis ,   Abdominal   Peds  Hematology   Anesthesia Other Findings   Reproductive/Obstetrics                             Anesthesia Physical Anesthesia Plan Anesthesia Quick Evaluation

## 2019-06-23 NOTE — Progress Notes (Signed)
[name] Documented: 04/26/2019 11:09 AM Location: [facility] Patient #: [id number] DOB: 1975-07-23 Divorced / Language: [name] / Race: White Female   History of Present Illness [name] Caydan Mctavish MD; 04/27/2019 8:33 AM) The patient is a 44 year old female presenting status-post bariatric surgery. Please see history below. She comes in for follow-up regarding her chronic seroma related to a ventral hernia repair done many years ago at a outside hospital. It was percutaneously drained back in January of this year. Even though she has had a follow-up CT that shows resolution she is still having output from the seroma cavity which is not unexpected. On her admission CT back in January there is some concern for pulmonary nodules. She has since seen pulmonary medicine and has a working diagnosis of sarcoidosis. She was also found to have anemia during her most recent hospitalization and has seen GI medicine specifically Dr. Fuller Plan who is planning an upper and lower scope. She states that some day she has minimal output from the drain. Other days she may have up to 50 cc of serous fluid. She states that the stitches come out. She states that she is taking her multivitamin and calcium. She did see the nutritionist on March 1. She continues to make consistent food choices.  Jan 2021 She underwent laparoscopic sleeve gastrectomy with lysis of adhesions for 1 hour on February 13, 2014. Her initial visit weight was 316 pounds. Her preop weight was 320 lbs. Last visit was June 2017 and weight was 235 lbs. her lowest weight was in August 2016 when she weighed 204.8 pounds. She has been lost to follow-up since January 2017. She came back on her radar when she was admitted to the hospital in January 2021 with malnutrition, vitamin deficiencies, recurrent abdominal wall seroma which is causing pain and trouble eating. it was percutaneously drained.  Her course has been somewhat complicated.  After her sleeve gastrectomy she developed a recurrent chronic abdominal wall seroma from her prior open ventral hernia repair done at an outside hospital. She has a history of a Hartman's procedure for perforated diverticulitis followed by colostomy takedown followed by open repair of incisional hernia which was complicated by a seroma which had to be drained-all of this done at outside hospital over 6 years ago. The fluid in her seroma has been persistent. We have percutaneously drained twice. We had to remove a drain early due to patient discomfort. She states that the seroma causes severe abdominal pain as well as back pain.  I ended up taking her back to the operating room on September 27 2014 for ID had a diagnostic laparoscopy. Her sleeve appeared normal. There was some recurrent adhesions in her midline. I also did incision and drainage and laparoscopic exploration of the abdominal wall seroma. I think cultures from the seroma fluid which grew nothing. I placed 10 g of talc powder into the seroma cavity to try to get it to scar down and to stop producing fluid. She went home the same day. She has had ongoing issues since then. She has called the office numerous times. She has been to the emergency room as well. The small incision made to incise and drain the seroma apparently was opened up. She has been doing wet-to-dry gauze packing. She states that she has seen her psychiatrist and they have adjusted several of her medications. Cultures were obtained of the incision that they opened up which grew nothing. Her white blood cell count has been normal.  When she was admitted in January of this year she states that she had not been able to eat much food for the past month and a half due to abdominal pain and recurrent swelling. She had been subsiding on yogurt and water. For about a month and a half. She had an albumin level of 1.7. Her INR was elevated thought to be due to  malnutrition. She had severe iron deficiency anemia with an iron level of 9. She received IV iron in the hospital. Her prealbumin level was 6.5. Vitamin A level was 10. Thiamine level was low at 40. B12 was normal. Vitamin D level was low at 20.9. She underwent placement of a percutaneous drain in the recurrent seroma cavity. sHe grew out MSSA. She has had a follow-up CT scan of her abdomen which showed resolution of the seroma but she is still having about 50-60 cc of serous output per day. She is keeping a drain diary. On the CT abdomen there was some concern of some pulmonary nodules. She underwent a formal CT chest this afternoon which I reviewed with her. Her CT chest showed multiple pulmonary nodules scattered throughout the lungs bilaterally in addition to mediastinal bilateral hilar lymphadenopathy up to 1.9 cm. Appearance is nonspecific but could represent sarcoidosis or other process can't rule out underlying malignancy.  She is now taking her multivitamin and supplements which I reviewed with her. Her appetite is improved. She is struggling with making good food choices. She is vaping. She has never had a mammogram.   Problem List/Past Medical [name] 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 Redmond Pulling, MD; 04/27/2019 8:38 AM) Oral Surgery  Cesarean Section - Multiple  Anal  Fissure Repair  Tonsillectomy  Colon Removal - Partial  [2008]: perf diverticulitis, Hartman's procedure; then colostomy reversal in [id number] Ventral / Umbilical Hernia Surgery  [2009]: Left. open incisional hernia repair (no mesh, complicated by postop seroma)  Diagnostic Studies History [name] Redmond Pulling, MD; 04/27/2019 8:38 AM) Colonoscopy  1-5 years ago Pap Smear  1-5 years ago Mammogram  never  Allergies [name] Redmond Pulling, MD; 04/27/2019 8:38 AM) Tylenol *ANALGESICS - NonNarcotic*   Medication History (Armen Ferguson, CMA; 04/26/2019 11:11 AM) Clopidogrel Bisulfate (75MG  Tablet, Oral) Active. Abdominal Binder (1 (one) daily, Taken starting 03/28/2019) Active. SEROquel (400MG  Tablet, Oral) Active. KlonoPIN (1MG  Tablet, Oral) Active. Multiple Vitamins (Oral) Active. Calcium (500MG  Tablet, Oral) Active. Ferrous Sulfate (325 (65 Fe)MG Tablet, Oral) Active. Cogentin (0.5MG  Tablet, Oral) Active. KlonoPIN (0.5MG  Tablet, Oral) Active. Nuvigil (250MG  Tablet, Oral) Active. Haldol (2MG  Tablet, Oral) Active. Topiramate (100MG  Tablet, Oral) Active. Atorvastatin Calcium (40MG  Tablet, Oral) Active. LORazepam (1MG  Tablet, Oral) Active. Amphetamine-Dextroamphetamine (15MG  Tablet, Oral) Active. Gabapentin (300MG  Capsule, Oral) Active. Vitamin D3 (50 MCG(2000 UT) Tablet, Oral) Active. Vitamin B-100 Complex (Oral) Active. Citracal (950MG  Tablet, Oral) Active. Medications Reconciled  Social History [name] Redmond Pulling, MD; 04/27/2019 8:38 AM) No drug use  Alcohol use  Remotely quit alcohol use. Tobacco use  Former smoker. Caffeine use  Tea.  Family History [name] Redmond Pulling, MD; 04/27/2019 8:38 AM) Arthritis  Mother. Thyroid problems  Mother. Melanoma  Daughter. Hypertension  Mother.  Pregnancy / Birth History [name] Redmond Pulling, MD; 04/27/2019 8:38 AM) Age at menarche  63 years. Contraceptive History  Intrauterine device. Irregular periods  Maternal age  49-20 Gravida   2 Para  2  Other Problems [name] Redmond Pulling, MD; 04/27/2019 8:38 AM) Arthritis  Kidney Stone  Diverticulosis  Umbilical Hernia Repair  LUMBAR AND SACRAL OSTEOARTHRITIS (M47.817)  URINARY TRACT INFECTION IN FEMALE (N39.0)  DYSURIA (R30.0)     Review of Systems [name] Ozie Lupe MD; 04/27/2019 8:34 AM) All other systems negative  Vitals (Armen Ferguson CMA; 04/26/2019 11:09 AM) 04/26/2019 11:09 AM Weight: 261.13 lb Height: 68in Body Surface Area: 2.29 m Body Mass Index: 39.7 kg/m  Temp.: 97.68F  Pulse: 98 (Regular)  P.OX: 99% (Room air) BP: 118/82(Sitting, Left Arm, Standard)       Physical Exam [name] Dorna Mallet MD; 04/27/2019 8:34 AM) General Mental Status-Alert. General Appearance-Consistent with stated age. Hydration-Well hydrated. Voice-Normal.  Head and Neck Head-normocephalic, atraumatic with no lesions or palpable masses. Trachea-midline. Thyroid Gland Characteristics - normal size and consistency.  Eye Eyeball - Bilateral-Normal. Sclera/Conjunctiva - Bilateral-No scleral icterus.  Chest and Lung Exam Chest and lung exam reveals -quiet, even and easy respiratory effort with no use of accessory muscles and on auscultation, normal breath sounds, no adventitious sounds and normal vocal resonance. Inspection Chest Wall - Normal. Back - normal.  Breast - Did not examine.  Cardiovascular Cardiovascular examination reveals -normal heart sounds, regular rate and rhythm with no murmurs and normal pedal pulses bilaterally.  Abdomen Inspection  Inspection of the abdomen reveals: Note: Old midline incision. Percutaneous drain in right mid abdomen with serosanguineous drainage. Scant irritation around the drain insertion site. Nontender abdomen. Skin - Scar - Note: well healed trocar scars. Palpation/Percussion Palpation and Percussion of the abdomen reveal - Soft, Non Tender, No Rebound tenderness, No Rigidity (guarding) and No  hepatosplenomegaly. Auscultation Auscultation of the abdomen reveals - Bowel sounds normal.  Peripheral Vascular Upper Extremity Palpation - Pulses bilaterally normal.  Neurologic Neurologic evaluation reveals -alert and oriented x 3 with no impairment of recent or remote memory. Mental Status-Normal.  Neuropsychiatric The patient's mood and affect are described as -normal. Judgment and Insight-insight is appropriate concerning matters relevant to self.  Musculoskeletal Normal Exam - Left-Upper Extremity Strength Normal and Lower Extremity Strength Normal. Normal Exam - Right-Upper Extremity Strength Normal and Lower Extremity Strength Normal.  Lymphatic Head & Neck  General Head & Neck Lymphatics: Bilateral - Description - Normal. Axillary - Did not examine. Femoral & Inguinal - Did not examine.    Assessment & Plan [name] Latonya Nelon MD; 04/27/2019 8:38 AM) S/P LAPAROSCOPIC SLEEVE GASTRECTOMY (Z98.84) Story: IV weight 316lbs; preop wt 320 lbs Impression: The last time we saw her in the clinic was in June 2017 until she was admitted in Jan 2021. She has been noncompliant with vitamins and supplements. She is now back on track. While she has had some weight gain since her lowest weight she is still down approximately 63 pounds since her preoperative visit. She had follow-up with the dietitian last week. We rediscussed proper eating techniques and behaviors. We discussed the importance of continuing multivitamin and supplements as prescribed. We discussed ramifications of vitamin deficiencies.  This patient encounter took 23 minutes today to perform the following: take history, perform exam, review outside records, interpret imaging, counsel the patient on their  diagnosis SEROMA Impression: She has this chronic seroma cavity in her abdominal wall soft tissue. This is been there for many years from a incisional hernia repair done at an outside hospital. I don't think removal  of the drain is indicated because it'll likely just recur. I did re-suture the drain to her skin after obtaining verbal consent and prepping the area with Betadine and then instilling some 1% lidocaine with epinephrine mixed with sodium bicarbonate and placing a 2-0 nylon suture-which she tolerated well. I was supervised by my medical assistant. 2 options are completes excision of her seroma cavity which could leave the very challenging soft tissue wound in my opinion and potential fascial defect. Versus a repeat of the procedure I did in 2016 where we essentially did a small cut down into the seroma cavity and laparoscopically evaluated it and injected talcum powder to try to scar down. That was successful and lasted for about 4-1/2 years. Given her mental health struggles another attempt at injecting the seroma cavity with talcum powder may be the best option as opposed to a major soft tissue operation. I will discuss her case with the plastic surgeon and see if they have any other thoughts or recommendations. I will call her with the plan Current Plans The patient was instructed to call back in 2 weeks with progress BIPOLAR AFFECTIVE DISORDER, REMISSION STATUS UNSPECIFIED (F31.9) Impression: Her behavior is stable today. not tearful. no tangential thoughts. stressed to her importance of routine follow up with her psychiatrist. Her mother is managing her medications  Leighton Ruff. Redmond Pulling, MD, FACS General, Bariatric, & Minimally Invasive Surgery [facility], [place]

## 2019-06-23 NOTE — Progress Notes (Signed)
When patient arrived earlier today in short stay she stated that her drain had changed in output this past Tuesday and she had developed severe pain on Tuesday.  The output of the drain appeared consistent with small bowel contents and it had a gastrointestinal odor to it.  I was very concerned that she had developed fistula to her subcutaneous percutaneous drain.  Therefore I felt it was not safe to proceed to the OR until we had more additional imaging and an idea of exactly what was going on.  We were able to arrange an urgent CT scan.  This was followed by drain injection under fluoroscopy  Fortunately there was no extravasation of oral contrast.  There is no sign of free air.  There was no sign of fistula.  On drain injection there is no evidence of communication through the abdominal muscle into the intra-abdominal space.  So therefore it appears that this seroma cavity which is quite chronic has probably come infected  This will necessitate a change in the operative plan which the patient nor I are not prepared to implement today.  Patient has a significant mental health history and I need to get her mentally prepared for the planned procedure which would be excisional debridement of her abdominal wall seroma and possible mesh with wound VAC placement which would require a longer recovery time and wound healing time  She is not systemically ill.  She is tolerating a diet.  She has no fever.  The abdominal skin is intact so therefore I do not believe she needs to be admitted.  We will send her out on oral antibiotics.  She did request a prescription for some pain medicine.  I encouraged her to first manage her pain with Tylenol.  We did discuss 24-hour limit of how much Tylenol.  I told her I would send in a prescription for a few pain tablets only.  She does have a history of her opioid narcotics not mixing well with her mental health medications and mental health disease therefore I will limit  how much I prescribed to her  I talked with her mother [name] over the phone at the end of the day just now.  I updated her as to the days' events and findings.  We also discussed the prescription for a few pain tablets and she was okay with that.  The mother has managed her medications in the past.  Our office will be in contact with the patient early next week to get her scheduled for excisional debridement of her abdominal wall seroma and placement of wound VAC  Leighton Ruff. Redmond Pulling, MD, FACS General, Bariatric, & Minimally Invasive Surgery [facility], [place]

## 2019-06-26 ENCOUNTER — Other Ambulatory Visit (HOSPITAL_COMMUNITY)
Admission: RE | Admit: 2019-06-26 | Discharge: 2019-06-26 | Disposition: A | Discharge status: Home / Self Care | Payer: Medicare Other | Source: Ambulatory Visit | Attending: General Surgery | Admitting: General Surgery

## 2019-06-26 ENCOUNTER — Telehealth: Payer: Self-pay | Admitting: Family Medicine

## 2019-06-26 ENCOUNTER — Encounter (HOSPITAL_COMMUNITY): Payer: Self-pay | Admitting: General Surgery

## 2019-06-26 ENCOUNTER — Other Ambulatory Visit: Payer: Self-pay

## 2019-06-26 DIAGNOSIS — Z01812 Encounter for preprocedural laboratory examination: Secondary | ICD-10-CM | POA: Insufficient documentation

## 2019-06-26 DIAGNOSIS — T8130XA Disruption of wound, unspecified, initial encounter: Secondary | ICD-10-CM

## 2019-06-26 DIAGNOSIS — S31109S Unspecified open wound of abdominal wall, unspecified quadrant without penetration into peritoneal cavity, sequela: Secondary | ICD-10-CM

## 2019-06-26 DIAGNOSIS — L089 Local infection of the skin and subcutaneous tissue, unspecified: Secondary | ICD-10-CM

## 2019-06-26 DIAGNOSIS — Z20822 Contact with and (suspected) exposure to covid-19: Secondary | ICD-10-CM | POA: Diagnosis not present

## 2019-06-26 LAB — SARS CORONAVIRUS 2 (TAT 6-24 HRS): SARS Coronavirus 2: NEGATIVE

## 2019-06-26 NOTE — Telephone Encounter (Signed)
CB # [phone] Call would like to get a referral for pain management

## 2019-06-26 NOTE — Telephone Encounter (Signed)
I am fine with referral for pain management.

## 2019-06-26 NOTE — Telephone Encounter (Signed)
CB#[phone] Pt having surgery Thursday Dr.Wilson going to place a wound vac need a referral for pain management.

## 2019-06-26 NOTE — Progress Notes (Signed)
Need orders in epic.  Surgery on 06/29/19.

## 2019-06-26 NOTE — Telephone Encounter (Signed)
Referral placed for pain management.

## 2019-06-26 NOTE — Progress Notes (Signed)
DUE TO COVID-19 ONLY ONE VISITOR IS ALLOWED TO COME WITH YOU AND STAY IN THE WAITING ROOM ONLY DURING PRE OP AND PROCEDURE DAY OF SURGERY. THE 1 VISITOR MAY VISIT WITH YOU AFTER SURGERY IN YOUR PRIVATE ROOM DURING VISITING HOURS ONLY!  YOU NEED TO HAVE A COVID 19 TEST ON_______ @_______ , THIS TEST MUST BE DONE BEFORE SURGERY, COME  [facility] [place] , [address].  ([place]) ONCE YOUR COVID TEST IS COMPLETED, PLEASE BEGIN THE QUARANTINE INSTRUCTIONS AS OUTLINED IN YOUR HANDOUT.                FLOYDENE LOOBY  06/26/2019   Your procedure is scheduled on:06/29/19     Report to [facility] Main  Entrance   Report to admitting at   0800 AM     Call this number if you have problems the morning of surgery [phone]    Remember: Do not eat food or drink liquids :After Midnight. BRUSH YOUR TEETH MORNING OF SURGERY AND RINSE YOUR MOUTH OUT, NO CHEWING GUM CANDY OR MINTS.     Take these medicines the morning of surgery with A SIP OF WATER:  Adderall, atenolol, lipitor, protonix, ativan, haldol  DO NOT TAKE ANY DIABETIC MEDICATIONS DAY OF YOUR SURGERY                               You may not have any metal on your body including hair pins and              piercings  Do not wear jewelry, make-up, lotions, powders or perfumes, deodorant             Do not wear nail polish on your fingernails.  Do not shave  48 hours prior to surgery.     Do not bring valuables to the hospital. [place].  Contacts, dentures or bridgework may not be worn into surgery.  Leave suitcase in the car. After surgery it may be brought to your room.     Patients discharged the day of surgery will not be allowed to drive home. IF YOU ARE HAVING SURGERY AND GOING HOME THE SAME DAY, YOU MUST HAVE AN ADULT TO DRIVE YOU HOME AND BE WITH YOU FOR 24 HOURS. YOU MAY GO HOME BY TAXI OR UBER OR ORTHERWISE, BUT AN ADULT MUST ACCOMPANY YOU HOME AND  STAY WITH YOU FOR 24 HOURS.  Name and phone number of your driver:  Special Instructions: N/A              Please read over the following fact sheets you were given: _____________________________________________________________________             [facility] - Preparing for Surgery Before surgery, you can play an important role.  Because skin is not sterile, your skin needs to be as free of germs as possible.  You can reduce the number of germs on your skin by washing with CHG (chlorahexidine gluconate) soap before surgery.  CHG is an antiseptic cleaner which kills germs and bonds with the skin to continue killing germs even after washing. Please DO NOT use if you have an allergy to CHG or antibacterial soaps.  If your skin becomes reddened/irritated stop using the CHG and inform your nurse when you arrive at Short Stay.  Do not shave (including legs and underarms) for at least 48 hours prior to the first CHG shower.  You may shave your face/neck. Please follow these instructions carefully:  1.  Shower with CHG Soap the night before surgery and the  morning of Surgery.  2.  If you choose to wash your hair, wash your hair first as usual with your  normal  shampoo.  3.  After you shampoo, rinse your hair and body thoroughly to remove the  shampoo.                           4.  Use CHG as you would any other liquid soap.  You can apply chg directly  to the skin and wash                       Gently with a scrungie or clean washcloth.  5.  Apply the CHG Soap to your body ONLY FROM THE NECK DOWN.   Do not use on face/ open                           Wound or open sores. Avoid contact with eyes, ears mouth and genitals (private parts).                       Wash face,  Genitals (private parts) with your normal soap.             6.  Wash thoroughly, paying special attention to the area where your surgery  will be performed.  7.  Thoroughly rinse your body with warm water from the neck down.  8.  DO  NOT shower/wash with your normal soap after using and rinsing off  the CHG Soap.                9.  Pat yourself dry with a clean towel.            10.  Wear clean pajamas.            11.  Place clean sheets on your bed the night of your first shower and do not  sleep with pets. Day of Surgery : Do not apply any lotions/deodorants the morning of surgery.  Please wear clean clothes to the hospital/surgery center.  FAILURE TO FOLLOW THESE INSTRUCTIONS MAY RESULT IN THE CANCELLATION OF YOUR SURGERY PATIENT SIGNATURE_________________________________  NURSE SIGNATURE__________________________________  ________________________________________________________________________

## 2019-06-27 ENCOUNTER — Encounter (HOSPITAL_COMMUNITY)
Admission: RE | Admit: 2019-06-27 | Discharge: 2019-06-27 | Disposition: A | Discharge status: Home / Self Care | Payer: Medicare Other | Source: Ambulatory Visit | Attending: General Surgery | Admitting: General Surgery

## 2019-06-27 ENCOUNTER — Ambulatory Visit (INDEPENDENT_AMBULATORY_CARE_PROVIDER_SITE_OTHER): Payer: Medicare Other | Admitting: Family Medicine

## 2019-06-27 ENCOUNTER — Other Ambulatory Visit: Payer: Medicare Other

## 2019-06-27 DIAGNOSIS — Z1329 Encounter for screening for other suspected endocrine disorder: Secondary | ICD-10-CM

## 2019-06-27 DIAGNOSIS — Z01812 Encounter for preprocedural laboratory examination: Secondary | ICD-10-CM | POA: Diagnosis not present

## 2019-06-27 DIAGNOSIS — R946 Abnormal results of thyroid function studies: Secondary | ICD-10-CM | POA: Diagnosis not present

## 2019-06-27 DIAGNOSIS — E538 Deficiency of other specified B group vitamins: Secondary | ICD-10-CM | POA: Diagnosis not present

## 2019-06-27 LAB — CBC WITH DIFFERENTIAL/PLATELET
Abs Immature Granulocytes: 0.02 10*3/uL (ref 0.00–0.07)
Basophils Absolute: 0 10*3/uL (ref 0.0–0.1)
Basophils Relative: 0 %
Eosinophils Absolute: 0.2 10*3/uL (ref 0.0–0.5)
Eosinophils Relative: 3 %
HCT: 37.8 % (ref 36.0–46.0)
Hemoglobin: 11.9 g/dL — ABNORMAL LOW (ref 12.0–15.0)
Immature Granulocytes: 0 %
Lymphocytes Relative: 20 %
Lymphs Abs: 1.2 10*3/uL (ref 0.7–4.0)
MCH: 27.7 pg (ref 26.0–34.0)
MCHC: 31.5 g/dL (ref 30.0–36.0)
MCV: 88.1 fL (ref 80.0–100.0)
Monocytes Absolute: 0.5 10*3/uL (ref 0.1–1.0)
Monocytes Relative: 8 %
Neutro Abs: 4.1 10*3/uL (ref 1.7–7.7)
Neutrophils Relative %: 69 %
Platelets: 309 10*3/uL (ref 150–400)
RBC: 4.29 MIL/uL (ref 3.87–5.11)
RDW: 14 % (ref 11.5–15.5)
WBC: 5.9 10*3/uL (ref 4.0–10.5)
nRBC: 0 % (ref 0.0–0.2)

## 2019-06-27 LAB — COMPREHENSIVE METABOLIC PANEL
ALT: 20 U/L (ref 0–44)
AST: 16 U/L (ref 15–41)
Albumin: 3.8 g/dL (ref 3.5–5.0)
Alkaline Phosphatase: 113 U/L (ref 38–126)
Anion gap: 7 (ref 5–15)
BUN: 6 mg/dL (ref 6–20)
CO2: 26 mmol/L (ref 22–32)
Calcium: 8.7 mg/dL — ABNORMAL LOW (ref 8.9–10.3)
Chloride: 105 mmol/L (ref 98–111)
Creatinine, Ser: 0.75 mg/dL (ref 0.44–1.00)
GFR calc Af Amer: >60 mL/min (ref >60)
GFR calc non Af Amer: >60 mL/min (ref >60)
Glucose, Bld: 91 mg/dL (ref 70–99)
Potassium: 3.4 mmol/L — ABNORMAL LOW (ref 3.5–5.1)
Sodium: 138 mmol/L (ref 135–145)
Total Bilirubin: 0.4 mg/dL (ref 0.3–1.2)
Total Protein: 7 g/dL (ref 6.5–8.1)

## 2019-06-27 LAB — HCG, SERUM, QUALITATIVE: Preg, Serum: NEGATIVE

## 2019-06-27 NOTE — Progress Notes (Signed)
CMP done 06/27/19 routed via epic to Dr Greer Pickerel.

## 2019-06-27 NOTE — Progress Notes (Addendum)
Anesthesia Review:  PCP: Dr Jenna Luo  Cardiologist : Chest x-ray :05/06/19  EKG :06/12/19  PFT-05/18/19  Echo : Cardiac Cath :  Sleep Study/ CPAP : Fasting Blood Sugar :      / Checks Blood Sugar -- times a day:   Blood Thinner/ Instructions /Last Dose: ASA / Instructions/ Last Dose :  Plavix- last dose 06/18/19 per patient  Patient denies shortness of breath, chest pain, fever, and cough at this phone interview.Patient reports hair has been falling out for the last month.  Patient had TSH collected on 06/27/19.   Surgery cancelled on 06/23/19.

## 2019-06-28 ENCOUNTER — Ambulatory Visit: Payer: Self-pay | Admitting: General Surgery

## 2019-06-28 LAB — TSH: TSH: 1.32 mIU/L

## 2019-06-28 NOTE — Progress Notes (Signed)
Rerequested orders in epic via note in epic to Dr Greer Pickerel.Marland Kitchen

## 2019-06-29 ENCOUNTER — Encounter (HOSPITAL_COMMUNITY): Payer: Self-pay | Admitting: General Surgery

## 2019-06-29 ENCOUNTER — Other Ambulatory Visit: Payer: Self-pay

## 2019-06-29 ENCOUNTER — Ambulatory Visit (HOSPITAL_COMMUNITY): Payer: Medicare Other | Admitting: Physician Assistant

## 2019-06-29 ENCOUNTER — Encounter (HOSPITAL_COMMUNITY)
Admission: RE | Disposition: A | Discharge status: Home / Self Care | Payer: Self-pay | Source: Home / Self Care | Attending: General Surgery

## 2019-06-29 ENCOUNTER — Observation Stay (HOSPITAL_COMMUNITY)
Admission: RE | Admit: 2019-06-29 | Discharge: 2019-06-30 | Disposition: A | Discharge status: Home / Self Care | Payer: Medicare Other | Attending: General Surgery | Admitting: General Surgery

## 2019-06-29 ENCOUNTER — Ambulatory Visit (HOSPITAL_COMMUNITY): Payer: Medicare Other | Admitting: Anesthesiology

## 2019-06-29 DIAGNOSIS — Y838 Other surgical procedures as the cause of abnormal reaction of the patient, or of later complication, without mention of misadventure at the time of the procedure: Secondary | ICD-10-CM | POA: Diagnosis not present

## 2019-06-29 DIAGNOSIS — F418 Other specified anxiety disorders: Secondary | ICD-10-CM | POA: Diagnosis not present

## 2019-06-29 DIAGNOSIS — Z87442 Personal history of urinary calculi: Secondary | ICD-10-CM | POA: Insufficient documentation

## 2019-06-29 DIAGNOSIS — S301XXA Contusion of abdominal wall, initial encounter: Secondary | ICD-10-CM | POA: Diagnosis present

## 2019-06-29 DIAGNOSIS — K219 Gastro-esophageal reflux disease without esophagitis: Secondary | ICD-10-CM | POA: Diagnosis not present

## 2019-06-29 DIAGNOSIS — Z6841 Body Mass Index (BMI) 40.0 and over, adult: Secondary | ICD-10-CM | POA: Diagnosis not present

## 2019-06-29 DIAGNOSIS — K449 Diaphragmatic hernia without obstruction or gangrene: Secondary | ICD-10-CM | POA: Diagnosis not present

## 2019-06-29 DIAGNOSIS — I10 Essential (primary) hypertension: Secondary | ICD-10-CM | POA: Diagnosis not present

## 2019-06-29 DIAGNOSIS — K760 Fatty (change of) liver, not elsewhere classified: Secondary | ICD-10-CM | POA: Diagnosis not present

## 2019-06-29 DIAGNOSIS — Z79899 Other long term (current) drug therapy: Secondary | ICD-10-CM | POA: Insufficient documentation

## 2019-06-29 DIAGNOSIS — M1990 Unspecified osteoarthritis, unspecified site: Secondary | ICD-10-CM | POA: Diagnosis not present

## 2019-06-29 DIAGNOSIS — D509 Iron deficiency anemia, unspecified: Secondary | ICD-10-CM | POA: Diagnosis not present

## 2019-06-29 DIAGNOSIS — Z8371 Family history of colonic polyps: Secondary | ICD-10-CM | POA: Insufficient documentation

## 2019-06-29 DIAGNOSIS — L7634 Postprocedural seroma of skin and subcutaneous tissue following other procedure: Principal | ICD-10-CM | POA: Insufficient documentation

## 2019-06-29 DIAGNOSIS — Z8673 Personal history of transient ischemic attack (TIA), and cerebral infarction without residual deficits: Secondary | ICD-10-CM | POA: Diagnosis not present

## 2019-06-29 DIAGNOSIS — G4730 Sleep apnea, unspecified: Secondary | ICD-10-CM | POA: Diagnosis not present

## 2019-06-29 DIAGNOSIS — S31109A Unspecified open wound of abdominal wall, unspecified quadrant without penetration into peritoneal cavity, initial encounter: Secondary | ICD-10-CM | POA: Diagnosis not present

## 2019-06-29 DIAGNOSIS — Z9884 Bariatric surgery status: Secondary | ICD-10-CM | POA: Diagnosis not present

## 2019-06-29 DIAGNOSIS — F909 Attention-deficit hyperactivity disorder, unspecified type: Secondary | ICD-10-CM | POA: Insufficient documentation

## 2019-06-29 DIAGNOSIS — Z7902 Long term (current) use of antithrombotics/antiplatelets: Secondary | ICD-10-CM | POA: Insufficient documentation

## 2019-06-29 DIAGNOSIS — F319 Bipolar disorder, unspecified: Secondary | ICD-10-CM | POA: Insufficient documentation

## 2019-06-29 DIAGNOSIS — R0602 Shortness of breath: Secondary | ICD-10-CM | POA: Insufficient documentation

## 2019-06-29 DIAGNOSIS — F429 Obsessive-compulsive disorder, unspecified: Secondary | ICD-10-CM | POA: Insufficient documentation

## 2019-06-29 DIAGNOSIS — Z87891 Personal history of nicotine dependence: Secondary | ICD-10-CM | POA: Insufficient documentation

## 2019-06-29 DIAGNOSIS — F209 Schizophrenia, unspecified: Secondary | ICD-10-CM | POA: Insufficient documentation

## 2019-06-29 DIAGNOSIS — Z825 Family history of asthma and other chronic lower respiratory diseases: Secondary | ICD-10-CM | POA: Insufficient documentation

## 2019-06-29 DIAGNOSIS — Z933 Colostomy status: Secondary | ICD-10-CM | POA: Diagnosis not present

## 2019-06-29 DIAGNOSIS — Z86010 Personal history of colonic polyps: Secondary | ICD-10-CM | POA: Insufficient documentation

## 2019-06-29 DIAGNOSIS — F419 Anxiety disorder, unspecified: Secondary | ICD-10-CM | POA: Insufficient documentation

## 2019-06-29 DIAGNOSIS — D869 Sarcoidosis, unspecified: Secondary | ICD-10-CM | POA: Insufficient documentation

## 2019-06-29 HISTORY — PX: APPLICATION OF WOUND VAC: SHX5189

## 2019-06-29 HISTORY — PX: DEBRIDEMENT OF ABDOMINAL WALL ABSCESS: SHX6396

## 2019-06-29 HISTORY — DX: Essential (primary) hypertension: I10

## 2019-06-29 HISTORY — PX: INCISION AND DRAINAGE ABSCESS: SHX5864

## 2019-06-29 SURGERY — INCISION AND DRAINAGE, ABSCESS
Anesthesia: General

## 2019-06-29 MED ORDER — BENZTROPINE MESYLATE 1 MG PO TABS
2.0000 mg | ORAL_TABLET | Freq: Two times a day (BID) | ORAL | Status: DC
  Administered 2019-06-30: 2 mg via ORAL
  Filled 2019-06-29: qty 2
  Filled 2019-06-29: qty 4

## 2019-06-29 MED ORDER — DIPHENHYDRAMINE HCL 50 MG/ML IJ SOLN: 12.5000 mg | Freq: Four times a day (QID) | INTRAMUSCULAR | Status: DC | PRN

## 2019-06-29 MED ORDER — ACETAMINOPHEN 500 MG PO TABS
1000.0000 mg | ORAL_TABLET | ORAL | Status: AC
  Administered 2019-06-29: 1000 mg via ORAL
  Filled 2019-06-29: qty 2

## 2019-06-29 MED ORDER — SODIUM CHLORIDE 0.9 % IV SOLN
INTRAVENOUS | Status: DC | PRN
  Administered 2019-06-29: 11:00:00 500 mL

## 2019-06-29 MED ORDER — LORAZEPAM 1 MG PO TABS
1.0000 mg | ORAL_TABLET | Freq: Three times a day (TID) | ORAL | Status: DC | PRN
  Administered 2019-06-29: 1 mg via ORAL
  Filled 2019-06-29: qty 1

## 2019-06-29 MED ORDER — ONDANSETRON HCL 4 MG/2ML IJ SOLN: 4.0000 mg | Freq: Four times a day (QID) | INTRAMUSCULAR | Status: DC | PRN

## 2019-06-29 MED ORDER — BUPIVACAINE HCL 0.25 % IJ SOLN
INTRAMUSCULAR | Status: AC
  Filled 2019-06-29: qty 1

## 2019-06-29 MED ORDER — SODIUM CHLORIDE 0.9 % IV SOLN
INTRAVENOUS | Status: AC
  Filled 2019-06-29: qty 500000

## 2019-06-29 MED ORDER — AMPHETAMINE-DEXTROAMPHETAMINE 5 MG PO TABS: 15.0000 mg | ORAL_TABLET | Freq: Three times a day (TID) | ORAL | Status: DC

## 2019-06-29 MED ORDER — SUGAMMADEX SODIUM 200 MG/2ML IV SOLN
INTRAVENOUS | Status: DC | PRN
  Administered 2019-06-29: 200 mg via INTRAVENOUS

## 2019-06-29 MED ORDER — ENOXAPARIN SODIUM 40 MG/0.4ML ~~LOC~~ SOLN
40.0000 mg | SUBCUTANEOUS | Status: DC
  Administered 2019-06-30: 40 mg via SUBCUTANEOUS
  Filled 2019-06-29: qty 0.4

## 2019-06-29 MED ORDER — AMPHETAMINE-DEXTROAMPHETAMINE 15 MG PO TABS: 15.0000 mg | ORAL_TABLET | Freq: Three times a day (TID) | ORAL | Status: DC

## 2019-06-29 MED ORDER — FENTANYL CITRATE (PF) 100 MCG/2ML IJ SOLN
INTRAMUSCULAR | Status: DC | PRN
  Administered 2019-06-29 (×4): 50 ug via INTRAVENOUS

## 2019-06-29 MED ORDER — FENTANYL CITRATE (PF) 100 MCG/2ML IJ SOLN
INTRAMUSCULAR | Status: AC
  Filled 2019-06-29: qty 4

## 2019-06-29 MED ORDER — PROPOFOL 10 MG/ML IV BOLUS
INTRAVENOUS | Status: AC
  Filled 2019-06-29: qty 20

## 2019-06-29 MED ORDER — PHENYLEPHRINE 40 MCG/ML (10ML) SYRINGE FOR IV PUSH (FOR BLOOD PRESSURE SUPPORT)
PREFILLED_SYRINGE | INTRAVENOUS | Status: DC | PRN
  Administered 2019-06-29 (×3): 80 ug via INTRAVENOUS

## 2019-06-29 MED ORDER — LACTATED RINGERS IV SOLN: INTRAVENOUS | Status: DC

## 2019-06-29 MED ORDER — KETAMINE HCL 10 MG/ML IJ SOLN
INTRAMUSCULAR | Status: AC
  Filled 2019-06-29: qty 1

## 2019-06-29 MED ORDER — ATENOLOL 25 MG PO TABS
25.0000 mg | ORAL_TABLET | Freq: Two times a day (BID) | ORAL | Status: DC
  Administered 2019-06-29 – 2019-06-30 (×2): 25 mg via ORAL
  Filled 2019-06-29 (×2): qty 1

## 2019-06-29 MED ORDER — KCL IN DEXTROSE-NACL 20-5-0.45 MEQ/L-%-% IV SOLN
INTRAVENOUS | Status: DC
  Filled 2019-06-29 (×2): qty 1000

## 2019-06-29 MED ORDER — LIDOCAINE 2% (20 MG/ML) 5 ML SYRINGE
INTRAMUSCULAR | Status: DC | PRN
  Administered 2019-06-29: 100 mg via INTRAVENOUS

## 2019-06-29 MED ORDER — HALOPERIDOL 5 MG PO TABS
10.0000 mg | ORAL_TABLET | Freq: Every day | ORAL | Status: DC
  Filled 2019-06-29: qty 2

## 2019-06-29 MED ORDER — ONDANSETRON 4 MG PO TBDP: 4.0000 mg | ORAL_TABLET | Freq: Four times a day (QID) | ORAL | Status: DC | PRN

## 2019-06-29 MED ORDER — AMPHETAMINE-DEXTROAMPHETAMINE 10 MG PO TABS
15.0000 mg | ORAL_TABLET | Freq: Three times a day (TID) | ORAL | Status: DC
  Administered 2019-06-29 – 2019-06-30 (×3): 15 mg via ORAL
  Filled 2019-06-29 (×3): qty 2

## 2019-06-29 MED ORDER — PROPOFOL 10 MG/ML IV BOLUS
INTRAVENOUS | Status: DC | PRN
  Administered 2019-06-29: 200 mg via INTRAVENOUS

## 2019-06-29 MED ORDER — OXYCODONE HCL 5 MG PO TABS
5.0000 mg | ORAL_TABLET | ORAL | Status: DC | PRN
  Administered 2019-06-29 – 2019-06-30 (×6): 10 mg via ORAL
  Filled 2019-06-29 (×6): qty 2

## 2019-06-29 MED ORDER — ONDANSETRON HCL 4 MG/2ML IJ SOLN: 4.0000 mg | Freq: Once | INTRAMUSCULAR | Status: DC | PRN

## 2019-06-29 MED ORDER — KETAMINE HCL 10 MG/ML IJ SOLN
INTRAMUSCULAR | Status: DC | PRN
  Administered 2019-06-29: 20 mg via INTRAVENOUS

## 2019-06-29 MED ORDER — FENTANYL CITRATE (PF) 100 MCG/2ML IJ SOLN
25.0000 ug | INTRAMUSCULAR | Status: DC | PRN
  Administered 2019-06-29 (×2): 50 ug via INTRAVENOUS

## 2019-06-29 MED ORDER — FENTANYL CITRATE (PF) 100 MCG/2ML IJ SOLN
INTRAMUSCULAR | Status: AC
  Filled 2019-06-29: qty 2

## 2019-06-29 MED ORDER — DOCUSATE SODIUM 100 MG PO CAPS
100.0000 mg | ORAL_CAPSULE | Freq: Two times a day (BID) | ORAL | Status: DC
  Administered 2019-06-29 – 2019-06-30 (×2): 100 mg via ORAL
  Filled 2019-06-29 (×2): qty 1

## 2019-06-29 MED ORDER — MODAFINIL 100 MG PO TABS: 200.0000 mg | ORAL_TABLET | Freq: Every day | ORAL | Status: DC

## 2019-06-29 MED ORDER — CEFAZOLIN SODIUM-DEXTROSE 2-4 GM/100ML-% IV SOLN
2.0000 g | INTRAVENOUS | Status: AC
  Administered 2019-06-29: 2 g via INTRAVENOUS
  Filled 2019-06-29: qty 100

## 2019-06-29 MED ORDER — TOPIRAMATE 100 MG PO TABS
100.0000 mg | ORAL_TABLET | Freq: Every day | ORAL | Status: DC
  Administered 2019-06-29: 100 mg via ORAL
  Filled 2019-06-29: qty 1

## 2019-06-29 MED ORDER — CHLORHEXIDINE GLUCONATE CLOTH 2 % EX PADS: 6.0000 | MEDICATED_PAD | Freq: Once | CUTANEOUS | Status: DC

## 2019-06-29 MED ORDER — SUCCINYLCHOLINE CHLORIDE 200 MG/10ML IV SOSY
PREFILLED_SYRINGE | INTRAVENOUS | Status: DC | PRN
  Administered 2019-06-29: 100 mg via INTRAVENOUS

## 2019-06-29 MED ORDER — PHENYLEPHRINE 40 MCG/ML (10ML) SYRINGE FOR IV PUSH (FOR BLOOD PRESSURE SUPPORT)
PREFILLED_SYRINGE | INTRAVENOUS | Status: AC
  Filled 2019-06-29: qty 10

## 2019-06-29 MED ORDER — DIPHENHYDRAMINE HCL 12.5 MG/5ML PO ELIX: 12.5000 mg | ORAL_SOLUTION | Freq: Four times a day (QID) | ORAL | Status: DC | PRN

## 2019-06-29 MED ORDER — KETOROLAC TROMETHAMINE 30 MG/ML IJ SOLN
30.0000 mg | Freq: Three times a day (TID) | INTRAMUSCULAR | Status: DC
  Administered 2019-06-29 – 2019-06-30 (×3): 30 mg via INTRAVENOUS
  Filled 2019-06-29 (×3): qty 1

## 2019-06-29 MED ORDER — OXYCODONE HCL 5 MG/5ML PO SOLN: 5.0000 mg | Freq: Once | ORAL | Status: DC | PRN

## 2019-06-29 MED ORDER — PANTOPRAZOLE SODIUM 40 MG PO TBEC
40.0000 mg | DELAYED_RELEASE_TABLET | Freq: Every day | ORAL | Status: DC
  Administered 2019-06-30: 40 mg via ORAL
  Filled 2019-06-29: qty 1

## 2019-06-29 MED ORDER — GABAPENTIN 300 MG PO CAPS
300.0000 mg | ORAL_CAPSULE | Freq: Three times a day (TID) | ORAL | Status: DC
  Administered 2019-06-29 – 2019-06-30 (×3): 300 mg via ORAL
  Filled 2019-06-29 (×3): qty 1

## 2019-06-29 MED ORDER — MORPHINE SULFATE (PF) 2 MG/ML IV SOLN
1.0000 mg | INTRAVENOUS | Status: DC | PRN
  Administered 2019-06-29 (×3): 2 mg via INTRAVENOUS
  Filled 2019-06-29 (×3): qty 1

## 2019-06-29 MED ORDER — OXYCODONE HCL 5 MG PO TABS: 5.0000 mg | ORAL_TABLET | Freq: Once | ORAL | Status: DC | PRN

## 2019-06-29 MED ORDER — 0.9 % SODIUM CHLORIDE (POUR BTL) OPTIME
TOPICAL | Status: DC | PRN
  Administered 2019-06-29: 1000 mL

## 2019-06-29 MED ORDER — DEXAMETHASONE SODIUM PHOSPHATE 10 MG/ML IJ SOLN
INTRAMUSCULAR | Status: DC | PRN
  Administered 2019-06-29: 10 mg via INTRAVENOUS

## 2019-06-29 MED ORDER — HALOPERIDOL 5 MG PO TABS
5.0000 mg | ORAL_TABLET | Freq: Every day | ORAL | Status: DC
  Filled 2019-06-29: qty 1

## 2019-06-29 MED ORDER — MIDAZOLAM HCL 2 MG/2ML IJ SOLN
INTRAMUSCULAR | Status: AC
  Filled 2019-06-29: qty 2

## 2019-06-29 MED ORDER — ACETAMINOPHEN 500 MG PO TABS
1000.0000 mg | ORAL_TABLET | Freq: Four times a day (QID) | ORAL | Status: DC
  Administered 2019-06-29 – 2019-06-30 (×3): 1000 mg via ORAL
  Filled 2019-06-29 (×3): qty 2

## 2019-06-29 MED ORDER — LACTATED RINGERS IV SOLN: INTRAVENOUS | Status: DC | PRN

## 2019-06-29 MED ORDER — ONDANSETRON HCL 4 MG/2ML IJ SOLN
INTRAMUSCULAR | Status: DC | PRN
  Administered 2019-06-29: 4 mg via INTRAVENOUS

## 2019-06-29 MED ORDER — LORATADINE 10 MG PO TABS
10.0000 mg | ORAL_TABLET | Freq: Every day | ORAL | Status: DC
  Administered 2019-06-30: 10 mg via ORAL
  Filled 2019-06-29: qty 1

## 2019-06-29 MED ORDER — QUETIAPINE FUMARATE 300 MG PO TABS
600.0000 mg | ORAL_TABLET | Freq: Every day | ORAL | Status: DC
  Administered 2019-06-29: 300 mg via ORAL
  Filled 2019-06-29: qty 2

## 2019-06-29 MED ORDER — MIDAZOLAM HCL 5 MG/5ML IJ SOLN
INTRAMUSCULAR | Status: DC | PRN
  Administered 2019-06-29: 2 mg via INTRAVENOUS

## 2019-06-29 MED ORDER — BUPIVACAINE-EPINEPHRINE 0.5% -1:200000 IJ SOLN
INTRAMUSCULAR | Status: AC
  Filled 2019-06-29: qty 1

## 2019-06-29 MED ORDER — ROCURONIUM BROMIDE 10 MG/ML (PF) SYRINGE
PREFILLED_SYRINGE | INTRAVENOUS | Status: DC | PRN
  Administered 2019-06-29: 10 mg via INTRAVENOUS
  Administered 2019-06-29: 30 mg via INTRAVENOUS

## 2019-06-29 SURGICAL SUPPLY — 41 items
ADH SKN CLS APL DERMABOND .7 (GAUZE/BANDAGES/DRESSINGS)
BNDG GAUZE ELAST 4 BULKY (GAUZE/BANDAGES/DRESSINGS) IMPLANT
CANISTER WOUNDNEG PRESSURE 500 (CANNISTER) ×1 IMPLANT
COVER SURGICAL LIGHT HANDLE (MISCELLANEOUS) ×2 IMPLANT
COVER WAND RF STERILE (DRAPES) IMPLANT
DECANTER SPIKE VIAL GLASS SM (MISCELLANEOUS) IMPLANT
DERMABOND ADVANCED (GAUZE/BANDAGES/DRESSINGS)
DERMABOND ADVANCED .7 DNX12 (GAUZE/BANDAGES/DRESSINGS) IMPLANT
DRAPE LAPAROSCOPIC ABDOMINAL (DRAPES) IMPLANT
DRAPE LAPAROTOMY T 102X78X121 (DRAPES) IMPLANT
DRAPE LAPAROTOMY T 98X78 PEDS (DRAPES) IMPLANT
DRAPE LAPAROTOMY TRNSV 102X78 (DRAPES) IMPLANT
DRAPE SHEET LG 3/4 BI-LAMINATE (DRAPES) IMPLANT
DRAPE UTILITY XL STRL (DRAPES) ×2 IMPLANT
DRSG PAD ABDOMINAL 8X10 ST (GAUZE/BANDAGES/DRESSINGS) IMPLANT
DRSG VAC ATS LRG SENSATRAC (GAUZE/BANDAGES/DRESSINGS) ×1 IMPLANT
ELECT REM PT RETURN 15FT ADLT (MISCELLANEOUS) ×2 IMPLANT
GAUZE PACKING IODOFORM 1/4X15 (PACKING) IMPLANT
GAUZE SPONGE 4X4 12PLY STRL (GAUZE/BANDAGES/DRESSINGS) ×2 IMPLANT
GLOVE BIO SURGEON STRL SZ7.5 (GLOVE) ×2 IMPLANT
GLOVE INDICATOR 8.0 STRL GRN (GLOVE) ×2 IMPLANT
GOWN STRL REUS W/TWL XL LVL3 (GOWN DISPOSABLE) ×4 IMPLANT
HANDPIECE INTERPULSE COAX TIP (DISPOSABLE)
KIT BASIN (CUSTOM PROCEDURE TRAY) ×2 IMPLANT
KIT TURNOVER KIT A (KITS) IMPLANT
MARKER SKIN DUAL TIP RULER LAB (MISCELLANEOUS) IMPLANT
NDL HYPO 25X1 1.5 SAFETY (NEEDLE) ×1 IMPLANT
NEEDLE HYPO 25X1 1.5 SAFETY (NEEDLE) ×2 IMPLANT
PACK GENERAL/GYN (CUSTOM PROCEDURE TRAY) ×2 IMPLANT
PENCIL SMOKE EVACUATOR (MISCELLANEOUS) IMPLANT
SET HNDPC FAN SPRY TIP SCT (DISPOSABLE) IMPLANT
SPONGE LAP 18X18 RF (DISPOSABLE) IMPLANT
SPONGE LAP 4X18 RFD (DISPOSABLE) IMPLANT
STAPLER VISISTAT 35W (STAPLE) IMPLANT
SUT MNCRL AB 4-0 PS2 18 (SUTURE) IMPLANT
SUT VIC AB 3-0 SH 18 (SUTURE) IMPLANT
SWAB COLLECTION DEVICE MRSA (MISCELLANEOUS) IMPLANT
SWAB CULTURE ESWAB REG 1ML (MISCELLANEOUS) IMPLANT
SYR CONTROL 10ML LL (SYRINGE) ×2 IMPLANT
TOWEL OR 17X26 10 PK STRL BLUE (TOWEL DISPOSABLE) ×2 IMPLANT
TOWEL OR NON WOVEN STRL DISP B (DISPOSABLE) ×2 IMPLANT

## 2019-06-29 NOTE — Op Note (Signed)
06/29/2019  11:43 AM  PATIENT:  [name]  44 y.o. female  PRE-OPERATIVE DIAGNOSIS:  CHRONIC ABDOMINAL WALL/SUBCU SEROMA  POST-OPERATIVE DIAGNOSIS:  CHRONIC ABDOMINAL WALL/SUBCU SEROMA  PROCEDURE:  Procedure(s): EXCISIONAL DEBRIDEMENT OF ABDOMINAL WALL/SUBCU SEROMA with scalpel PLACEMENT OF WOUND VAC (15 x 15 x 8 cm)  SURGEON:  Surgeon(s): Greer Pickerel, MD  ASSISTANTS: none   ANESTHESIA:   general  DRAINS: wound vac   LOCAL MEDICATIONS USED:  NONE  SPECIMEN:  Source of Specimen:  seroma cavity  DISPOSITION OF SPECIMEN:  discarded  COUNTS:  YES  INDICATION FOR PROCEDURE: 44 year old female who had a remote history of perforated diverticulitis in the mid 2000s who underwent sigmoid colectomy with colostomy at outside hospital.  She then underwent ostomy reversal.  She then developed an incisional hernia.  She was taken to the OR in 2009 for an open primary incisional hernia repair at [facility].  She developed a postoperative seroma which was managed in several ways 1 of which was a percutaneous drain as well as incision and drainage and evacuation.  Her abdominal wall seroma had resolved for many years.  I met her in 2015 when she underwent a laparoscopic sleeve gastrectomy by me.  During that procedure she was noted to have omental adhesions and some colon adhesions to her anterior abdominal wall.  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  Interestingly after her sleeve gastrectomy her abdominal wall seroma recurred.  It was initially managed with percutaneous drain.  Unfortunately the patient became manic post bariatric surgery.  This required several month intervention.  Because she had a persistent seroma and a fragile mental health condition I did not recommend excisional debridement of the seroma cavity.  We took her to the OR in 2016 where I cut down on the seroma cavity and went in laparoscopically and  cauterized the lining as well as injected talc powder.  This resulted in resolution of the seroma which lasted for several years.  She represented this December with a large recurrent abdominal wall seroma.  sHe underwent percutaneous drain.  She continued to have drainage.  After long discussion she and I both decided to repeat the same procedure from 2016.  When she presented last Thursday for surgery she had noted that her drain output had changed and she had some worsening pain.  On physical exam the drain content was concerning for small bowel enteric contents.  I canceled the procedure and she underwent a CT scan of her abdomen as well as drain injection.  There was no sign of intra-abdominal process.  There is no sign of small bowel connection.  No sign of perforation.  It appeared that perhaps she just had developed an infection of her seroma cavity from having a percutaneous drain and since January 2021.  We brought her back today and we change the nature of the procedure to excisional debridement of her abdominal wall seroma since she had demonstrated some infection in it.  We had a long conversation both last Thursday as well as again this morning.  Please see those records for additional information  PROCEDURE: After obtaining informed consent the patient was taken to the OR 10 at [facility] and placed supine on the operating table.  General LMA anesthesia was established.  Her abdomen was prepped and draped in the usual standard surgical fashion.  The drain was prepped but draped outside the field.  As we were draping the patient the patient desatted a little bit so anesthesia decided to switch her to an endotracheal tube and there were no oxygenation concerns after that.  She had an old midline incision that healed by secondary intention.  In reviewing her CT scan from last week it appeared that the seroma cavity was sitting on top of the abdominal wall fascia along the length of the  midline.  I sharply incised and elliptically excised her old midline scar for approximately 15 cm.  Subcutaneous tissue was divided.  I was able to get down to the anterior wall of the seroma cavity and I encountered the percutaneous drain.  I ended up sharply excising the anterior wall of the entire seroma cavity.  There was no fluid within the seroma cavity.  There was no sign of enteric contents.  There was no sign of exposed small bowel.  The anterior and lateral walls of the seroma cavity were removed somewhat in a piecemeal fashion.  It did extend outward from the right midline.  I was successful in removing the anterior component as well as the side components of the seroma cavity.  The posterior wall or the layer directly on top of the fascia was left in place.  Viewing her CT scan it appeared that the seroma was probably fused to some of her rectus muscle.  And since I had been in her abdominal cavity and there was signs of a Swiss cheese defect laparoscopically with flecks of omentum I was concerned that if I removed the posterior wall of the seroma cavity we would actually get intra-abdominal and potentially have fascial defects and/or potential bowel exposure.  Not a lot of posterior wall of the seroma cavity left.  Probably about 4 x 4 cm.  I cauterized this with the Bovie.  The cavity was irrigated with antibiotic saline.  The cavity measured 15 cm x 15 cm & about 3 cm vertically.  I decided to place a wound VAC.  A sponge was placed extending horizontally along the base and then another black sponge on top followed by placement of the plastic drapes and we had a good seal.  The percutaneous drain had been previously removed.  All needle, instrument and sponge counts were correct x2.  There were no immediate complications patient was extubated taken recovery room stable condition  PLAN OF CARE: Admit for overnight observation  PATIENT DISPOSITION:  PACU - hemodynamically stable.   Delay start of  Pharmacological VTE agent (>24hrs) due to surgical blood loss or risk of bleeding:  no  Leighton Ruff. Redmond Pulling, MD, FACS General, Bariatric, & Minimally Invasive Surgery [facility], [place]

## 2019-06-29 NOTE — Anesthesia Postprocedure Evaluation (Signed)
Anesthesia Post Note  Patient: [name]  Procedure(s) Performed: INCISION AND DRAINAGE (N/A ) EXCISIONAL DEBRIDEMENT OF ABDOMINAL WALL/SUBCU SEROMA (N/A ) PLACEMENT OF WOUND VAC (N/A )     Patient location during evaluation: PACU Anesthesia Type: General Level of consciousness: awake and alert Pain management: pain level controlled Vital Signs Assessment: post-procedure vital signs reviewed and stable Respiratory status: spontaneous breathing, nonlabored ventilation and respiratory function stable Cardiovascular status: blood pressure returned to baseline and stable Postop Assessment: no apparent nausea or vomiting Anesthetic complications: no    Last Vitals:  Vitals:   06/29/19 1215 06/29/19 1250  BP: 107/68 116/68  Pulse: 77 70  Resp: (!) 21 20  Temp: 36.6 C 36.6 C  SpO2: 98% 96%    Last Pain:  Vitals:   06/29/19 1250  TempSrc: Oral  PainSc:                  Lidia Collum

## 2019-06-29 NOTE — H&P (Signed)
[name] is an 44 y.o. female.   Chief Complaint: here for surgery HPI: She comes in today for planned incision and drainage and excisional debridement of a chronic abdominal wall seroma.  We had initially planned to do her surgery last Thursday where we were just going to incise the seroma cavity and instilled talc powder or tisseal in order to create scarring to seal the seroma cavity which worked for several years.  But when she appeared she stated that her drainage from her PERC drain had changed in nature and she had a foul-smelling odor.  On exam it was very concerning for enteric small bowel contents.  Therefore I canceled her procedure and sent her for a CT scan as well as drain injection.  Fortunately there was no evidence of connection or communication to bowel.  There was no signs of intestinal compromise.  But because her seroma cavity had clearly become infected I felt that we needed to do the more invasive procedure which we were able to reschedule for this week.  I had been reluctant to perform the more invasive procedure that were planning today because of the patient's mental health situation but given the change in output I think this is the best and only option at this point.  She is in agreement.   The patient is a 44 year old female presenting status-post bariatric surgery. Please see history below. She comes in for follow-up regarding her chronic seroma related to a ventral hernia repair done many years ago at a outside hospital. It was percutaneously drained back in January of this year. Even though she has had a follow-up CT that shows resolution she is still having output from the seroma cavity which is not unexpected. On her admission CT back in January there is some concern for pulmonary nodules. She has since seen pulmonary medicine and has a working diagnosis of sarcoidosis. She was also found to have anemia during her most recent hospitalization and has seen GI medicine  specifically Dr. Fuller Plan who is planning an upper and lower scope. She states that some day she has minimal output from the drain. Other days she may have up to 50 cc of serous fluid. She states that the stitches come out. She states that she is taking her multivitamin and calcium. She did see the nutritionist on March 1. She continues to make consistent food choices.  Jan 2021 She underwent laparoscopic sleeve gastrectomy with lysis of adhesions for 1 hour on February 13, 2014. Her initial visit weight was 316 pounds. Her preop weight was 320 lbs. Last visit was June 2017 and weight was 235 lbs. her lowest weight was in August 2016 when she weighed 204.8 pounds. She has been lost to follow-up since January 2017. She came back on her radar when she was admitted to the hospital in January 2021 with malnutrition, vitamin deficiencies, recurrent abdominal wall seroma which is causing pain and trouble eating. it was percutaneously drained.  Her course has been somewhat complicated. After her sleeve gastrectomy she developed a recurrent chronic abdominal wall seroma from her prior open ventral hernia repair done at an outside hospital. She has a history of a Hartman's procedure for perforated diverticulitis followed by colostomy takedown followed by open repair of incisional hernia which was complicated by a seroma which had to be drained-all of this done at outside hospital over 6 years ago. The fluid in her seroma has been persistent. We have percutaneously drained twice. We had to  remove a drain early due to patient discomfort. She states that the seroma causes severe abdominal pain as well as back pain.  I ended up taking her back to the operating room on September 27 2014 for ID had a diagnostic laparoscopy. Her sleeve appeared normal. There was some recurrent adhesions in her midline. I also did incision and drainage and laparoscopic exploration of the abdominal wall seroma. I think cultures  from the seroma fluid which grew nothing. I placed 10 g of talc powder into the seroma cavity to try to get it to scar down and to stop producing fluid. She went home the same day. She has had ongoing issues since then. She has called the office numerous times. She has been to the emergency room as well. The small incision made to incise and drain the seroma apparently was opened up. She has been doing wet-to-dry gauze packing. She states that she has seen her psychiatrist and they have adjusted several of her medications. Cultures were obtained of the incision that they opened up which grew nothing. Her white blood cell count has been normal.   When she was admitted in January of this year she states that she had not been able to eat much food for the past month and a half due to abdominal pain and recurrent swelling. She had been subsiding on yogurt and water. For about a month and a half. She had an albumin level of 1.7. Her INR was elevated thought to be due to malnutrition. She had severe iron deficiency anemia with an iron level of 9. She received IV iron in the hospital. Her prealbumin level was 6.5. Vitamin A level was 10. Thiamine level was low at 40. B12 was normal. Vitamin D level was low at 20.9. She underwent placement of a percutaneous drain in the recurrent seroma cavity. sHe grew out MSSA. She has had a follow-up CT scan of her abdomen which showed resolution of the seroma but she is still having about 50-60 cc of serous output per day. She is keeping a drain diary. On the CT abdomen there was some concern of some pulmonary nodules. She underwent a formal CT chest this afternoon which I reviewed with her. Her CT chest showed multiple pulmonary nodules scattered throughout the lungs bilaterally in addition to mediastinal bilateral hilar lymphadenopathy up to 1.9 cm. Appearance is nonspecific but could represent sarcoidosis or other process can't rule out underlying  malignancy.  She is now taking her multivitamin and supplements which I reviewed with her. Her appetite is improved. She is struggling with making good food choices. She is vaping. She has never had a mammogram.   Past Medical History:  Diagnosis Date  . ADHD   . Anal fistula   . Anxiety   . Arthritis    pt denies 5/3 visit   . Bipolar affective ([place])   . Blood transfusion without reported diagnosis   . Colostomy in place [facility])   . CVA (cerebrovascular accident) ([place])    left cerebellar infarct found accidentally on CT (2020)   . Depression   . Diverticulitis 2008   perforated/ requiring resection  . Fatty liver   . Gallstones   . GERD (gastroesophageal reflux disease)    occ.  Marland Kitchen Headache    after a fall  . Herpes simplex   . History of colon polyps   . History of hiatal hernia    small  . History of kidney stones    cystoscopy  basket removal  . Hypertension   . Iron deficiency anemia   . Obese   . OCD (obsessive compulsive disorder)   . Panic attacks   . Pulmonary nodules   . Sarcoidosis   . Schizophrenia ([place])   . Shortness of breath dyspnea    pt denies on 5/3 visit   . Sleep apnea    mild, does not use c-pap machine  . Substance abuse ([place])    12 years ago-crack cocaine    Past Surgical History:  Procedure Laterality Date  . Abdominal wall hernia  07/2007   open repair with lysis of adhesions  . BRONCHIAL BRUSHINGS  04/11/2019   Procedure: BRONCHIAL BRUSHINGS;  Surgeon: Collene Gobble, MD;  Location: [facility] ENDOSCOPY;  Service: Pulmonary;;  . BRONCHIAL WASHINGS  04/11/2019   Procedure: BRONCHIAL WASHINGS;  Surgeon: Collene Gobble, MD;  Location: [facility] ENDOSCOPY;  Service: Pulmonary;;  . COLONOSCOPY  03/13/2010   YIR:SWNIOEVO hemorrhoids likely cause of hematochezia, otherwise normal  . COLONOSCOPY  05/24/2019  . COLONOSCOPY WITH ESOPHAGOGASTRODUODENOSCOPY (EGD)  04/2019  . Colostomy reversal  11/2006  . ESOPHAGOGASTRODUODENOSCOPY  03/13/2010    JJK:[id number] esophagus, status post passage of a Maloney dilator/Small hiatal hernia/ Antral erosions, status post biopsy  . EXAMINATION UNDER ANESTHESIA N/A 05/11/2012   Procedure: EXAM UNDER ANESTHESIA;  Surgeon: Donato Heinz, MD;  Location: [facility] ORS;  Service: General;  Laterality: N/A;  . Exploratory laparotomy with resection  2008   colonoscopy  . FINE NEEDLE ASPIRATION  04/11/2019   Procedure: FINE NEEDLE ASPIRATION (FNA) LINEAR;  Surgeon: Collene Gobble, MD;  Location: [facility] ENDOSCOPY;  Service: Pulmonary;;  . FINGER CLOSED REDUCTION Right 08/05/2012   Procedure: CLOSED REDUCTION RIGHT THUMB (FINGER);  Surgeon: Linna Hoff, MD;  Location: [facility];  Service: Orthopedics;  Laterality: Right;  . HERNIA REPAIR     incisional hernia surgery   . Hx of abd wall seroma  08/2007   Drained via [place] in [place], [place]  . Hx of abd wall seroma  10/2007   Drained by Dr. Geroge Baseman in office  . IR RADIOLOGIST EVAL & MGMT  03/15/2019  . IR RADIOLOGIST EVAL & MGMT  04/06/2019  . Kidney stones    . LAPAROSCOPIC GASTRIC SLEEVE RESECTION N/A 02/13/2014   Procedure: LAPAROSCOPIC GASTRIC SLEEVE RESECTION LYSIS OF ADHESIONS, UPPER ENDOSCOPY;  Surgeon: [name], MD;  Location: [facility] ORS;  Service: General;  Laterality: N/A;  . LAPAROSCOPY N/A 09/28/2014   Procedure: LAPAROSCOPY DIAGNOSTIC, INCISION AND DRAINAGE WITH LAPAROSCOPIC EXPLORATION OF ABDOMINAL WALL SEROMA with ultrasound;  Surgeon: Greer Pickerel, MD;  Location: [facility] ORS;  Service: General;  Laterality: N/A;  . LUNG BIOPSY  04/11/2019   Procedure: LUNG BIOPSY;  Surgeon: Collene Gobble, MD;  Location: [facility] ENDOSCOPY;  Service: Pulmonary;;  distal trachea  . PLACEMENT OF SETON N/A 05/11/2012   Procedure: PLACEMENT OF SETON;  Surgeon: Donato Heinz, MD;  Location: [facility] ORS;  Service: General;  Laterality: N/A;  . root canal 7-16    . TONSILLECTOMY    . TREATMENT FISTULA ANAL     x 2  . UPPER GASTROINTESTINAL ENDOSCOPY  05/24/2019  . VIDEO BRONCHOSCOPY WITH  ENDOBRONCHIAL ULTRASOUND N/A 04/11/2019   Procedure: VIDEO BRONCHOSCOPY WITH ENDOBRONCHIAL ULTRASOUND;  Surgeon: Collene Gobble, MD;  Location: [facility] ENDOSCOPY;  Service: Pulmonary;  Laterality: N/A;    Family History  Problem Relation Age of Onset  . Asthma Maternal Grandmother   . Colon polyps Maternal Grandmother   .  Colon cancer Neg Hx   . Esophageal cancer Neg Hx   . Rectal cancer Neg Hx   . Stomach cancer Neg Hx    Social History:  reports that she quit smoking about 5 years ago. Her smoking use included e-cigarettes. She has a 7.00 pack-year smoking history. She has never used smokeless tobacco. She reports that she does not drink alcohol or use drugs.  Allergies: No Known Allergies  Facility-Administered Medications Prior to Admission  Medication Dose Route Frequency Provider Last Rate Last Admin  . cyanocobalamin ((VITAMIN B-12)) injection 1,000 mcg  1,000 mcg Intramuscular Q30 days Susy Frizzle, MD   1,000 mcg at 06/27/19 1131   Medications Prior to Admission  Medication Sig Dispense Refill  . acetaminophen (TYLENOL) 500 MG tablet Take 2,000 mg by mouth daily as needed for moderate pain or headache.    . amphetamine-dextroamphetamine (ADDERALL) 15 MG tablet Take 15 mg by mouth 3 (three) times daily.     Marland Kitchen atenolol (TENORMIN) 25 MG tablet Take 25 mg by mouth 2 (two) times daily.     Marland Kitchen atorvastatin (LIPITOR) 40 MG tablet TAKE 1 TABLET BY MOUTH EVERY DAY (Patient taking differently: Take 40 mg by mouth daily. ) 90 tablet 1  . benztropine (COGENTIN) 2 MG tablet Take 2 mg by mouth 2 (two) times daily.     . calcium carbonate (OS-CAL - DOSED IN MG OF ELEMENTAL CALCIUM) 1250 (500 Ca) MG tablet Take 1 tablet by mouth daily.    . Cholecalciferol (VITAMIN D) 50 MCG (2000 UT) tablet Take 2,000 Units by mouth daily.    . Cyanocobalamin (B-12 COMPLIANCE INJECTION) 1000 MCG/ML KIT Inject 1,000 mcg as directed every 30 (thirty) days.    . ferrous sulfate 325 (65 FE) MG tablet Take 325 mg  by mouth daily with breakfast.    . fexofenadine (ALLEGRA) 180 MG tablet Take 180 mg by mouth daily as needed for rhinitis (sore throat).    . gabapentin (NEURONTIN) 300 MG capsule Take 300 mg by mouth 3 (three) times daily.    . haloperidol (HALDOL) 5 MG tablet Take 5-10 mg by mouth See admin instructions. 5 mg in the morning, 10 mg at bedtime    . LORazepam (ATIVAN) 1 MG tablet Take 1-2 mg by mouth See admin instructions. 1 tab twice daily, 2 mg at bedtime    . Multiple Vitamins-Minerals (MULTIVITAMIN WITH MINERALS) tablet Take 1 tablet by mouth daily.     Marland Kitchen NUVIGIL 250 MG tablet Take 250 mg by mouth daily.  5  . ondansetron (ZOFRAN ODT) 4 MG disintegrating tablet Take 1 tablet (4 mg total) by mouth every 8 (eight) hours as needed for nausea or vomiting. 20 tablet 0  . pantoprazole (PROTONIX) 40 MG tablet Take 1 tablet (40 mg total) by mouth daily. 30 tablet 11  . QUEtiapine (SEROQUEL) 300 MG tablet Take 600 mg by mouth at bedtime.     . topiramate (TOPAMAX) 100 MG tablet Take 100 mg by mouth at bedtime.     . clopidogrel (PLAVIX) 75 MG tablet Take 75 mg by mouth daily.    . valACYclovir (VALTREX) 500 MG tablet Take 1 tablet (500 mg total) by mouth daily as needed (fever blister). (Patient taking differently: Take 2,000 mg by mouth daily as needed (fever blister). ) 90 tablet 2    Results for orders placed or performed during the hospital encounter of 06/27/19 (from the past 48 hour(s))  Comprehensive metabolic panel     Status:  Abnormal   Collection Time: 06/27/19 10:30 AM  Result Value Ref Range   Sodium 138 135 - 145 mmol/L   Potassium 3.4 (L) 3.5 - 5.1 mmol/L   Chloride 105 98 - 111 mmol/L   CO2 26 22 - 32 mmol/L   Glucose, Bld 91 70 - 99 mg/dL    Comment: Glucose reference range applies only to samples taken after fasting for at least 8 hours.   BUN 6 6 - 20 mg/dL   Creatinine, Ser 0.75 0.44 - 1.00 mg/dL   Calcium 8.7 (L) 8.9 - 10.3 mg/dL   Total Protein 7.0 6.5 - 8.1 g/dL    Albumin 3.8 3.5 - 5.0 g/dL   AST 16 15 - 41 U/L   ALT 20 0 - 44 U/L   Alkaline Phosphatase 113 38 - 126 U/L   Total Bilirubin 0.4 0.3 - 1.2 mg/dL   GFR calc non Af Amer >60 >60 mL/min   GFR calc Af Amer >60 >60 mL/min   Anion gap 7 5 - 15    Comment: Performed at [facility], [place] [address], [place], [place] [address]  hCG, serum, qualitative     Status: None   Collection Time: 06/27/19 10:30 AM  Result Value Ref Range   Preg, Serum NEGATIVE NEGATIVE    Comment:        THE SENSITIVITY OF THIS METHODOLOGY IS >10 mIU/mL. Performed at [facility], [place] [address], [place], [place] [address]   CBC with Differential/Platelet     Status: Abnormal   Collection Time: 06/27/19 10:30 AM  Result Value Ref Range   WBC 5.9 4.0 - 10.5 K/uL   RBC 4.29 3.87 - 5.11 MIL/uL   Hemoglobin 11.9 (L) 12.0 - 15.0 g/dL   HCT 37.8 36.0 - 46.0 %   MCV 88.1 80.0 - 100.0 fL   MCH 27.7 26.0 - 34.0 pg   MCHC 31.5 30.0 - 36.0 g/dL   RDW 14.0 11.5 - 15.5 %   Platelets 309 150 - 400 K/uL   nRBC 0.0 0.0 - 0.2 %   Neutrophils Relative % 69 %   Neutro Abs 4.1 1.7 - 7.7 K/uL   Lymphocytes Relative 20 %   Lymphs Abs 1.2 0.7 - 4.0 K/uL   Monocytes Relative 8 %   Monocytes Absolute 0.5 0.1 - 1.0 K/uL   Eosinophils Relative 3 %   Eosinophils Absolute 0.2 0.0 - 0.5 K/uL   Basophils Relative 0 %   Basophils Absolute 0.0 0.0 - 0.1 K/uL   Immature Granulocytes 0 %   Abs Immature Granulocytes 0.02 0.00 - 0.07 K/uL    Comment: Performed at [facility], [place] [address], [place], [place] [address]   No results found.  Review of Systems  All other systems reviewed and are negative.   Blood pressure 130/86, pulse 78, temperature 98.4 F (36.9 C), temperature source Oral, resp. rate 18, weight 116.6 kg, last menstrual period 06/23/2019, SpO2 98 %. Physical Exam  Vitals reviewed. Constitutional: She is oriented to person, place, and time. She appears  well-developed and well-nourished. No distress.  HENT:  Head: Normocephalic and atraumatic.  Right Ear: External ear normal.  Left Ear: External ear normal.  Eyes: Conjunctivae are normal. No scleral icterus.  Neck: No tracheal deviation present. No thyromegaly present.  Cardiovascular: Normal rate and normal heart sounds.  Respiratory: Effort normal and breath sounds normal. No stridor. No respiratory distress. She has no wheezes.  GI: Soft.  She exhibits no distension. There is no abdominal tenderness. There is no rebound.  Old incisions; perc drain - right mid abd- greenish fluid in tube but light peach fluid in bulb. Skin intact. Old midline incision that healed by 2nd intention.   Musculoskeletal:        General: No tenderness or edema.     Cervical back: Normal range of motion and neck supple.  Lymphadenopathy:    She has no cervical adenopathy.  Neurological: She is alert and oriented to person, place, and time. She exhibits normal muscle tone.  Skin: Skin is warm and dry. No rash noted. She is not diaphoretic. No erythema. No pallor.  Psychiatric: She has a normal mood and affect. Her behavior is normal. Judgment and thought content normal. Her affect is not inappropriate.     Assessment/Plan Chronic abdominal wall seroma from prior ventral incisional hernia repair from many years ago Severe obesity History of sleeve gastrectomy Bipolar affective disorder History of CVA-Plavix currently being held Sarcoidosis  Discussed risk and benefits of the procedure in entirety including but not limited to bleeding, infection, injury to surrounding structures, chronic wound issues, nonhealing wound, blood clot formation, recurrent stroke, perioperative cardiac and pulmonary events, small bowel fistula, hernia formation.  We discussed the typical recovery.  We did discuss the possibility that I may leave some of the seroma cavity intact at the base bc of concern of recurrent hernia.   Discussed rationale for not closing wound and letting it heal by secondary intention.  Discussed postoperative pain control.  Patient is very concerned about postoperative pain control and is requesting prescription pain medication and also asking me how many tablets I am going to give her.  Discussed rationale for taking scheduled Tylenol and alternating with Motrin and using opioid pain prescription for breakthrough pain  All questions asked and answered  Leighton Ruff. Redmond Pulling, MD, FACS General, Bariatric, & Minimally Invasive Surgery [facility], [place]    Greer Pickerel, MD 06/29/2019, 9:27 AM

## 2019-06-29 NOTE — Anesthesia Preprocedure Evaluation (Signed)
Anesthesia Evaluation  Patient identified by MRN, date of birth, ID band Patient awake    Reviewed: Allergy & Precautions, NPO status , Patient's Chart, lab work & pertinent test results  History of Anesthesia Complications Negative for: history of anesthetic complications  Airway Mallampati: IV  TM Distance: >3 FB Neck ROM: Full    Dental  (+) Teeth Intact   Pulmonary sleep apnea , Patient abstained from smoking., former smoker,    Pulmonary exam normal        Cardiovascular hypertension, negative cardio ROS Normal cardiovascular exam     Neuro/Psych PSYCHIATRIC DISORDERS Anxiety Depression Bipolar Disorder Schizophrenia CVA (incidental finding)    GI/Hepatic Neg liver ROS, hiatal hernia, GERD  ,S/p sleeve gastrectomy   Endo/Other  Morbid obesity  Renal/GU negative Renal ROS  negative genitourinary   Musculoskeletal negative musculoskeletal ROS (+)   Abdominal   Peds  Hematology  (+) anemia ,   Anesthesia Other Findings  Abdominal wall seroma  Reproductive/Obstetrics                             Anesthesia Physical Anesthesia Plan  ASA: III  Anesthesia Plan: General   Post-op Pain Management:    Induction: Intravenous  PONV Risk Score and Plan: 3 and Ondansetron, Dexamethasone, Midazolam and Treatment may vary due to age or medical condition  Airway Management Planned: LMA  Additional Equipment: None  Intra-op Plan:   Post-operative Plan: Extubation in OR  Informed Consent: I have reviewed the patients History and Physical, chart, labs and discussed the procedure including the risks, benefits and alternatives for the proposed anesthesia with the patient or authorized representative who has indicated his/her understanding and acceptance.     Dental advisory given  Plan Discussed with:   Anesthesia Plan Comments:         Anesthesia Quick Evaluation

## 2019-06-29 NOTE — Anesthesia Procedure Notes (Signed)
Procedure Name: Intubation Date/Time: 06/29/2019 10:30 AM Performed by: Lavina Hamman, CRNA Pre-anesthesia Checklist: Patient identified, Emergency Drugs available, Suction available, Patient being monitored and Timeout performed Patient Re-evaluated:Patient Re-evaluated prior to induction Oxygen Delivery Method: Circle system utilized Preoxygenation: Pre-oxygenation with 100% oxygen Induction Type: IV induction Ventilation: Mask ventilation without difficulty Laryngoscope Size: Mac and 3 Grade View: Grade I Tube type: Oral Tube size: 7.5 mm Number of attempts: 1 Airway Equipment and Method: Stylet Placement Confirmation: ETT inserted through vocal cords under direct vision,  positive ETCO2,  CO2 detector and breath sounds checked- equal and bilateral Secured at: 22 cm Tube secured with: Tape Dental Injury: Teeth and Oropharynx as per pre-operative assessment  Comments: ATOI

## 2019-06-29 NOTE — Discharge Instructions (Signed)
RESUME PLAVIX ON Monday 07/03/2019  CC[facility]      [facility], [place] [phone]   POST OP INSTRUCTIONS  Always review your discharge instruction sheet given to you by the facility where your surgery was performed.  IF YOU HAVE DISABILITY OR FAMILY LEAVE FORMS, YOU MUST BRING THEM TO THE OFFICE FOR PROCESSING.  PLEASE DO NOT GIVE THEM TO YOUR DOCTOR.  1. A prescription for pain medication may be given to you upon discharge.  See pain control handout. 2. Take your usually prescribed medications unless otherwise directed. 3. If you need a refill on your pain medication, please contact your pharmacy. They will contact our office to request authorization.  Prescriptions will not be filled after 5pm or on week-ends. 4. You should follow a light diet the first few days after arrival home, such as soup and crackers, pudding, etc.unless your doctor has advised otherwise. A high-fiber, low fat diet can be resumed as tolerated.   Be sure to include lots of fluids daily. Most patients will experience some swelling and bruising in the incision area.  Ice packs will help.  Swelling and bruising can take several days to resolve 5. Most patients will experience some swelling and bruising in the area of the incision. Ice pack will help. Swelling and bruising can take several days to resolve..  6. It is common to experience some constipation if taking pain medication after surgery.  Increasing fluid intake and taking a stool softener will usually help or prevent this problem from occurring.  A mild laxative (Milk of Magnesia or Miralax) should be taken according to package directions if there are no bowel movements after 48 hours. 7.   8. ACTIVITIES:  You may resume regular (light) daily activities beginning the next day--such as daily self-care, walking, climbing stairs--gradually increasing activities as tolerated.  You may have sexual intercourse when it is comfortable.  Refrain from any heavy lifting or  straining until approved by your doctor. a. You may drive when you no longer are taking prescription pain medication, you can comfortably wear a seatbelt, and you can safely maneuver your car and apply brakes b. Return to Work: ___________________________________ 51. You should see your doctor in the office for a follow-up appointment approximately two weeks after your surgery.  Make sure that you call for this appointment within a day or two after you arrive home to insure a convenient appointment time. OTHER INSTRUCTIONS:  _____________________________________________________________ _____________________________________________________________  WHEN TO CALL YOUR DOCTOR: 1. Fever over 101.0 2. Inability to urinate 3. Nausea and/or vomiting 4. Extreme swelling or bruising 5. Continued bleeding from incision. 6. Increased pain, redness, or drainage from the incision. 7. Difficulty swallowing or breathing 8. Muscle cramping or spasms. 9. Numbness or tingling in hands or feet or around lips.  The clinic staff is available to answer your questions during regular business hours.  Please don't hesitate to call and ask to speak to one of the nurses if you have concerns.  For further questions, please visit [email or web address]   Negative Pressure Wound Therapy Home Guide Negative pressure wound therapy (NPWT) uses a sponge or foam-like material (dressing) placed on or inside the wound. The wound is then covered and sealed with a cover dressing that sticks to your skin (is adhesive). This keeps air out. A tube is attached to the cover dressing, and this tube connects to a small pump. The pump sucks fluid and germs from the wound. NPWT helps to increase blood flow to the wound  and heal it from the inside. What are the risks? NPWT is usually safe to use. However, problems can occur, including:  Skin irritation from the dressing adhesive.  Bleeding.  Infection.  Dehydration. Wounds  with large amounts of drainage can cause excessive fluid loss.  Pain. Supplies needed:  A disposable garbage bag.  Soap and water, or hand sanitizer.  Wound cleanser or salt-water solution (saline).  New sponge and cover dressing.  Protective clothing.  Gauze pad.  Vinyl gloves.  Tape.  Skin protectant. This may be a wipe, film, or spray.  Clean or germ-free (sterile) scissors.  Eye protection. How to change your dressing Prepare to change your dressing  1. If told by your health care provider, take pain medicine 30 minutes before changing the dressing. 2. Wash your hands with soap and water. Dry your hands with a clean towel. If soap and water are not available, use hand sanitizer. 3. Set up a clean station for wound care. 4. Open the dressing package so that the sponge dressing remains on the inside of the package. 5. Wear gloves, protective clothing, and eye protection. Remove old dressing  1. Turn off the pump and disconnect the tubing from the dressing. 2. Carefully remove the adhesive cover dressing in the direction of your hair growth. 3. Remove the sponge dressing that is inside the wound. If the sponge sticks, use a wound cleanser or saline solution to wet the sponge and help it come off more easily. 4. Throw the old sponge and cover dressing supplies into the garbage bag. 5. Remove your gloves by grabbing the cuff and turning the glove inside out. Place the gloves in the trash immediately. 6. Wash your hands with soap and water. Dry your hands with a clean towel. If soap and water are not available, use hand sanitizer. Clean your wound  Wear gloves, protective clothing, and eye protection. Follow your health care provider's instructions on how to clean your wound. You may be told to: 1. Clean the wound using a saline solution or a wound cleanser and a clean gauze pad. 2. Pat the wound dry with a gauze pad. Do not rub the wound. 3. Throw the gauze pad into the  garbage bag. 4. Remove your gloves by grabbing the cuff and turning the glove inside out. Place the gloves in the trash immediately. 5. Wash your hands with soap and water. Dry your hands with a clean towel. If soap and water are not available, use hand sanitizer. Apply new dressing  Wear gloves, protective clothing, and eye protection. 1. If told by your health care provider, apply a skin protectant to any skin that will be exposed to adhesive. Let the skin protectant dry. 2. Cut a piece of new sponge dressing and put it on or in the wound. 3. Using clean scissors, cut a nickel-sized hole in the new cover dressing. 4. Apply the cover dressing. 5. Attach the suction tube over the hole in the cover dressing. 6. Take off your gloves. Put them in the plastic bag with the old dressing. Tie the bag shut and throw it away. 7. Wash your hands with soap and water. Dry your hands with a clean towel. If soap and water are not available, use hand sanitizer. 8. Turn the pump back on. The sponge dressing should collapse. Do not change the settings on the machine without talking to a health care provider. 9. Replace the container in the pump that collects fluid if it is full.  Replace the container per the manufacturer's instructions or at least once a week, even if it is not full. General tips and recommendations If the alarm sounds:  Stay calm.  Do not turn off the pump or do anything with the dressing.  Reasons the alarm may go off: ? The battery is low. Change the battery or plug the device into electrical power. ? The dressing has a leak. Find the leak and put tape over the leak. ? The fluid collection container is full. Change the fluid container.  Call your health care provider right away if you cannot fix the problem.  Explain to your health care provider what is happening. Follow his or her instructions. General instructions  Do not turn off the pump unless told to do so by your health care  provider.  Do not turn off the pump for more than 2 hours. If the pump is off for more than 2 hours, the dressing will need to be changed.  If your health care provider says it is okay to shower: ? Do not take the pump into the shower. ? Make sure the wound dressing is protected and sealed. The wound dressing must stay dry.  Check frequently that the machine indicates that therapy is on and that all clamps are open.  Do not use over-the-counter medicated or antiseptic creams, sprays, liquids, or dressings unless your health care provider approves. Contact a health care provider if:  You have new pain.  You develop irritation, a rash, or itching around the wound or dressing.  You see new black or yellow tissue in your wound.  The dressing changes are painful or cause bleeding.  The pump has been off for more than 2 hours, and you do not know how to change the dressing.  The pump alarm goes off, and you do not know what to do. Get help right away if:  You have a lot of bleeding.  The wound breaks open.  You have severe pain.  You have signs of infection, such as: ? More redness, swelling, or pain. ? More fluid or blood. ? Warmth. ? Pus or a bad smell. ? Red streaks leading from the wound. ? A fever.  You see a sudden change in the color or texture of the drainage.  You have signs of dehydration, such as: ? Little or no tears, urine, or sweat. ? Muscle cramps. ? Very dry mouth. ? Headache. ? Dizziness. Summary  Negative pressure wound therapy (NPWT) is a device that helps your wound heal.  Set up a clean station for wound care. Your health care provider will tell you what supplies to use.  Follow your health care provider's instructions on how to clean your wound and how to change the dressing.  Contact a health care provider if you have new pain, an irritation, or a rash, or if the alarm goes off and you do not know what to do.  Get help right away if you have  a lot of bleeding, your wound breaks open, or you have severe pain. Also, get help if you have signs of infection. This information is not intended to replace advice given to you by your health care provider. Make sure you discuss any questions you have with your health care provider. Document Revised: 06/03/2018 Document Reviewed: 04/29/2018 Elsevier Patient Education  [place].  ........Marland Kitchen   Managing Your Pain After Surgery Without Opioids    Thank you for participating in our program to  help patients manage their pain after surgery without opioids. This is part of our effort to provide you with the best care possible, without exposing you or your family to the risk that opioids pose.  What pain can I expect after surgery? You can expect to have some pain after surgery. This is normal. The pain is typically worse the day after surgery, and quickly begins to get better. Many studies have found that many patients are able to manage their pain after surgery with Over-the-Counter (OTC) medications such as Tylenol and Motrin. If you have a condition that does not allow you to take Tylenol or Motrin, notify your surgical team.  How will I manage my pain? The best strategy for controlling your pain after surgery is around the clock pain control with Tylenol (acetaminophen) and Motrin (ibuprofen or Advil). Alternating these medications with each other allows you to maximize your pain control. In addition to Tylenol and Motrin, you can use heating pads or ice packs on your incisions to help reduce your pain.  How will I alternate your regular strength over-the-counter pain medication? You will take a dose of pain medication every three hours. ; Start by taking 650 mg of Tylenol (2 pills of 325 mg) ; 3 hours later take 600 mg of Motrin (3 pills of 200 mg) ; 3 hours after taking the Motrin take 650 mg of Tylenol ; 3 hours after that take 600 mg of Motrin.   - 1 -  See example - if your  first dose of Tylenol is at 12:00 PM   12:00 PM Tylenol 650 mg (2 pills of 325 mg)  3:00 PM Motrin 600 mg (3 pills of 200 mg)  6:00 PM Tylenol 650 mg (2 pills of 325 mg)  9:00 PM Motrin 600 mg (3 pills of 200 mg)  Continue alternating every 3 hours   We recommend that you follow this schedule around-the-clock for at least 3 days after surgery, or until you feel that it is no longer needed. Use the table on the last page of this handout to keep track of the medications you are taking. Important: Do not take more than 3000mg  of Tylenol or 1800mg  of Motrin in a 24-hour period. Do not take ibuprofen/Motrin if you have a history of bleeding stomach ulcers, severe kidney disease, &/or actively taking a blood thinner  What if I still have pain? If you have pain that is not controlled with the over-the-counter pain medications (Tylenol and Motrin or Advil) you might have what we call "breakthrough" pain. You will receive a prescription for a small amount of an opioid pain medication such as Oxycodone, Tramadol, or Tylenol with Codeine. Use these opioid pills in the first 24 hours after surgery if you have breakthrough pain. Do not take more than 1 pill every 4-6 hours.  If you still have uncontrolled pain after using all opioid pills, don't hesitate to call our staff using the number provided. We will help make sure you are managing your pain in the best way possible, and if necessary, we can provide a prescription for additional pain medication.   Day 1    Time  Name of Medication Number of pills taken  Amount of Acetaminophen  Pain Level   Comments  AM PM       AM PM       AM PM       AM PM       AM PM  AM PM       AM PM       AM PM       Total Daily amount of Acetaminophen Do not take more than  3,000 mg per day      Day 2    Time  Name of Medication Number of pills taken  Amount of Acetaminophen  Pain Level   Comments  AM PM       AM PM       AM PM       AM PM        AM PM       AM PM       AM PM       AM PM       Total Daily amount of Acetaminophen Do not take more than  3,000 mg per day      Day 3    Time  Name of Medication Number of pills taken  Amount of Acetaminophen  Pain Level   Comments  AM PM       AM PM       AM PM       AM PM          AM PM       AM PM       AM PM       AM PM       Total Daily amount of Acetaminophen Do not take more than  3,000 mg per day      Day 4    Time  Name of Medication Number of pills taken  Amount of Acetaminophen  Pain Level   Comments  AM PM       AM PM       AM PM       AM PM       AM PM       AM PM       AM PM       AM PM       Total Daily amount of Acetaminophen Do not take more than  3,000 mg per day      Day 5    Time  Name of Medication Number of pills taken  Amount of Acetaminophen  Pain Level   Comments  AM PM       AM PM       AM PM       AM PM       AM PM       AM PM       AM PM       AM PM       Total Daily amount of Acetaminophen Do not take more than  3,000 mg per day       Day 6    Time  Name of Medication Number of pills taken  Amount of Acetaminophen  Pain Level  Comments  AM PM       AM PM       AM PM       AM PM       AM PM       AM PM       AM PM       AM PM       Total Daily amount of Acetaminophen Do not take more than  3,000 mg per day      Day 7    Time  Name of Medication Number of pills taken  Amount of Acetaminophen  Pain Level   Comments  AM PM       AM PM       AM PM       AM PM       AM PM       AM PM       AM PM       AM PM       Total Daily amount of Acetaminophen Do not take more than  3,000 mg per day        For additional information about how and where to safely dispose of unused opioid medications - [employer].br  Disclaimer: This document contains information and/or instructional materials adapted from [place] for the typical patient with your condition. It  does not replace medical advice from your health care provider because your experience may differ from that of the typical patient. Talk to your health care provider if you have any questions about this document, your condition or your treatment plan. Adapted from [place]

## 2019-06-29 NOTE — Anesthesia Procedure Notes (Signed)
Procedure Name: LMA Insertion Date/Time: 06/29/2019 10:24 AM Performed by: Lavina Hamman, CRNA Pre-anesthesia Checklist: Patient identified, Emergency Drugs available, Suction available and Patient being monitored Patient Re-evaluated:Patient Re-evaluated prior to induction Oxygen Delivery Method: Circle System Utilized Preoxygenation: Pre-oxygenation with 100% oxygen Induction Type: IV induction Ventilation: Mask ventilation without difficulty LMA: LMA with gastric port inserted LMA Size: 4.0 Number of attempts: 1 Airway Equipment and Method: Bite block Placement Confirmation: positive ETCO2 Tube secured with: Tape Dental Injury: Teeth and Oropharynx as per pre-operative assessment

## 2019-06-29 NOTE — Transfer of Care (Signed)
Immediate Anesthesia Transfer of Care Note  Patient: [name]  Procedure(s) Performed: Procedure(s): INCISION AND DRAINAGE (N/A) EXCISIONAL DEBRIDEMENT OF ABDOMINAL WALL/SUBCU SEROMA (N/A) PLACEMENT OF WOUND VAC (N/A)  Patient Location: PACU  Anesthesia Type:General  Level of Consciousness:  sedated, patient cooperative and responds to stimulation  Airway & Oxygen Therapy:Patient Spontanous Breathing and Patient connected to face mask oxgen  Post-op Assessment:  Report given to PACU RN and Post -op Vital signs reviewed and stable  Post vital signs:  Reviewed and stable  Last Vitals:  Vitals:   06/29/19 0810 06/29/19 1138  BP: 130/86   Pulse: 78   Resp: 18   Temp: 36.9 C (P) 36.8 C  SpO2: [id number]     Complications: No apparent anesthesia complications

## 2019-06-30 DIAGNOSIS — F319 Bipolar disorder, unspecified: Secondary | ICD-10-CM | POA: Diagnosis not present

## 2019-06-30 DIAGNOSIS — Z8673 Personal history of transient ischemic attack (TIA), and cerebral infarction without residual deficits: Secondary | ICD-10-CM | POA: Diagnosis not present

## 2019-06-30 DIAGNOSIS — Z6841 Body Mass Index (BMI) 40.0 and over, adult: Secondary | ICD-10-CM | POA: Diagnosis not present

## 2019-06-30 DIAGNOSIS — L7634 Postprocedural seroma of skin and subcutaneous tissue following other procedure: Secondary | ICD-10-CM | POA: Diagnosis not present

## 2019-06-30 DIAGNOSIS — Z9884 Bariatric surgery status: Secondary | ICD-10-CM | POA: Diagnosis not present

## 2019-06-30 MED ORDER — OXYCODONE HCL 5 MG PO TABS
5.0000 mg | ORAL_TABLET | ORAL | 0 refills | Status: AC | PRN
Start: 2019-06-30 — End: 2019-07-07

## 2019-06-30 MED ORDER — ACETAMINOPHEN 500 MG PO TABS
1000.0000 mg | ORAL_TABLET | Freq: Three times a day (TID) | ORAL | 0 refills | Status: AC
Start: 2019-06-30 — End: 2019-07-07

## 2019-06-30 MED ORDER — MODAFINIL 200 MG PO TABS
200.0000 mg | ORAL_TABLET | Freq: Every day | ORAL | Status: DC
  Administered 2019-06-30: 200 mg via ORAL
  Filled 2019-06-30: qty 1

## 2019-06-30 NOTE — TOC Initial Note (Signed)
Transition of Care [facility]) - Initial/Assessment Note    Patient Details  Name: [name] MRN: [id number] Date of Birth: Sep 28, 1975  Transition of Care [facility]) CM/SW Contact:    Lia Hopping, [place] Phone Number: 06/30/2019, 12:09 PM  Clinical Narrative:                 Patient admitted for excisional debridement of abdominal wall/SUBCU SEROMA -Placement of wound vac (15 x 15 x 8 cm)          CSW made a referral to [facility] for wound vac. Physician completed the short form Vac Application. CSW confirm the Vac will be ordered to today.   CSW met the patient at bedside to discuss plan home with a Vac and HHRN. CSW offered home health choice. Patient chose [facility]CSW reached out the agency and confirm staff availability.  [place] liaison to meet with patient at beside to answer any question the patient may have about her [facility] needs.    TOC staff will continue to follow this patient for discharge needs.    Expected Discharge Plan: [place] Barriers to Discharge: No Barriers Identified   Patient Goals and CMS Choice   CMS Medicare.gov Compare Post Acute Care list provided to:: Patient Choice offered to / list presented to : Patient  Expected Discharge Plan and Services Expected Discharge Plan: [place] In-house Referral: (P) Clinical Social Work   Post Acute Care Choice: [place] arrangements for the past 2 months: [place] Expected Discharge Date: 06/30/19               DME Arranged: Harlin Heys DME Agency: [facility] Date DME Agency Contacted: 06/30/19 Time DME Agency Contacted: [phone] Representative spoke with at DME Agency: Leontine Locket HH Arranged: RN [place] Agency: [facility] ([place]) Date [place]: 06/30/19 Time [place]: 1100 Representative spoke with at [place]: Santiago Glad  Prior Living Arrangements/Services Living arrangements for the past 2 months: Single Family  Home Lives with:: (P) Parents Patient language and need for interpreter reviewed:: No Do you feel safe going back to the place where you live?: Yes      Need for Family Participation in Patient Care: Yes (Comment) Care giver support system in place?: Yes (comment)   Criminal Activity/Legal Involvement Pertinent to Current Situation/Hospitalization: No - Comment as needed  Activities of Daily Living Home Assistive Devices/Equipment: None ADL Screening (condition at time of admission) Patient's cognitive ability adequate to safely complete daily activities?: Yes Is the patient deaf or have difficulty hearing?: No Does the patient have difficulty seeing, even when wearing glasses/contacts?: No Does the patient have difficulty concentrating, remembering, or making decisions?: Yes Patient able to express need for assistance with ADLs?: Yes Does the patient have difficulty dressing or bathing?: No Independently performs ADLs?: Yes (appropriate for developmental age) Does the patient have difficulty walking or climbing stairs?: No Weakness of Legs: None Weakness of Arms/Hands: None  Permission Sought/Granted Permission sought to share information with : Facility Art therapist granted to share information with : Yes, Verbal Permission Granted              Emotional Assessment Appearance:: Appears stated age   Affect (typically observed): Accepting Orientation: : Oriented to Self, Oriented to Place, Oriented to  Time, Oriented to Situation Alcohol / Substance Use: Not Applicable Psych Involvement: No (comment)  Admission diagnosis:  Abdominal wall seroma [S30.1XXA] Patient Active  Problem List   Diagnosis Date Noted  . Sarcoidosis 04/26/2019  . Abnormal CT of the chest 03/23/2019  . Dyspnea 03/23/2019  . Hypokalemia 02/24/2019  . Sepsis ([place]) 02/24/2019  . Cerebellar infarct ([place]) 11/16/2018  . CVA (cerebrovascular accident) ([place])   . Degeneration of lumbar  or lumbosacral intervertebral disc 12/13/2014  . Anemia associated with nutritional deficiency 11/12/2014  . Bipolar affective disorder ([place]) 09/28/2014  . Anxiety state 09/28/2014  . S/P laparoscopic sleeve gastrectomy 01/2014 09/28/2014  . Abdominal wall seroma - chronic   . Seroma   . Cholelithiases 04/25/2013  . Cough 09/19/2011  . Smoker 09/19/2011  . GERD 03/04/2010  . RECTAL BLEEDING 03/04/2010  . DYSPHAGIA UNSPECIFIED 03/04/2010  . DYSPHAGIA 03/04/2010  . RUQ PAIN 03/04/2010   PCP:  Susy Frizzle, MD Pharmacy:   [id number] [place] [id number]: [phone]: [phone]    Social Determinants of Health (SDOH) Interventions    Readmission Risk Interventions No flowsheet data found.

## 2019-06-30 NOTE — Plan of Care (Signed)
  Problem: Education: Goal: Knowledge of General Education information will improve Description: Including pain rating scale, medication(s)/side effects and non-pharmacologic comfort measures 06/30/2019 1513 by Hubert Azure, RN Outcome: Adequate for Discharge 06/30/2019 1159 by Hubert Azure, RN Outcome: Progressing   Problem: Health Behavior/Discharge Planning: Goal: Ability to manage health-related needs will improve 06/30/2019 1513 by Hubert Azure, RN Outcome: Adequate for Discharge 06/30/2019 1159 by Hubert Azure, RN Outcome: Progressing   Problem: Clinical Measurements: Goal: Ability to maintain clinical measurements within normal limits will improve 06/30/2019 1513 by Hubert Azure, RN Outcome: Adequate for Discharge 06/30/2019 1159 by Hubert Azure, RN Outcome: Progressing Goal: Will remain free from infection 06/30/2019 1513 by Hubert Azure, RN Outcome: Adequate for Discharge 06/30/2019 1159 by Hubert Azure, RN Outcome: Progressing Goal: Diagnostic test results will improve Outcome: Adequate for Discharge Goal: Respiratory complications will improve Outcome: Adequate for Discharge Goal: Cardiovascular complication will be avoided Outcome: Adequate for Discharge   Problem: Activity: Goal: Risk for activity intolerance will decrease Outcome: Adequate for Discharge   Problem: Nutrition: Goal: Adequate nutrition will be maintained Outcome: Adequate for Discharge   Problem: Coping: Goal: Level of anxiety will decrease Outcome: Adequate for Discharge   Problem: Elimination: Goal: Will not experience complications related to bowel motility Outcome: Adequate for Discharge Goal: Will not experience complications related to urinary retention Outcome: Adequate for Discharge   Problem: Pain Managment: Goal: General experience of comfort will improve Outcome: Adequate for Discharge   Problem: Safety: Goal: Ability to remain free from injury will  improve Outcome: Adequate for Discharge   Problem: Skin Integrity: Goal: Risk for impaired skin integrity will decrease Outcome: Adequate for Discharge   Problem: Education: Goal: Required Educational Video(s) Outcome: Adequate for Discharge   Problem: Clinical Measurements: Goal: Postoperative complications will be avoided or minimized Outcome: Adequate for Discharge

## 2019-06-30 NOTE — Plan of Care (Signed)

## 2019-06-30 NOTE — TOC Transition Note (Signed)
Transition of Care [facility]) - CM/SW Discharge Note   Patient Details  Name: [name] MRN: [id number] Date of Birth: Jul 12, 1975  Transition of [facility]) CM/SW Contact:  Lia Hopping, [place] Phone Number: 06/30/2019, 3:01 PM   Clinical Narrative:    Wound Vac delivered to the patient bedside.  [facility] care will start 5/8. Patient reports understanding.     Final next level of care: Home w Home Health Services Barriers to Discharge: No Barriers Identified   Patient Goals and CMS Choice   CMS Medicare.gov Compare Post Acute Care list provided to:: Patient Choice offered to / list presented to : Patient  Discharge Placement                       Discharge Plan and Services In-house Referral: (P) Clinical Social Work   Post Acute Care Choice: Home Health          DME Arranged: Vac DME Agency: [facility] Date DME Agency Contacted: 06/30/19 Time DME Agency Contacted: [phone] Representative spoke with at DME Agency: Leontine Locket HH Arranged: RN [place] Agency: [facility] ([place]) Date [place]: 06/30/19 Time [place]: 1100 Representative spoke with at [place]: [place] (Mila Doce) Interventions     Readmission Risk Interventions No flowsheet data found.

## 2019-06-30 NOTE — Discharge Summary (Signed)
Physician Discharge Summary  Patient ID: [name] MRN: [id number] DOB/AGE: 1975/08/17 44 y.o.  Admit date: 06/29/2019 Discharge date: 06/30/2019  Admission Diagnoses: Chronic abdominal wall seroma from prior ventral incisional hernia repair from many years ago Severe obesity History of sleeve gastrectomy Bipolar affective disorder History of CVA-Plavix currently being held Sarcoidosis  Discharge Diagnoses:  Active Problems:   Abdominal wall seroma - chronic Chronic abdominal wall seroma from prior ventral incisional hernia repair from many years ago Severe obesity History of sleeve gastrectomy Bipolar affective disorder History of CVA-Plavix currently being held Sarcoidosis  Discharged Condition: good  Hospital Course: Brought in for planned excisional debridement of her chronic abdominal wall seroma.  She underwent excisional debridement of her abdominal wall seroma cavity with placement of a wound VAC on May 6.  She was kept overnight for observation.  On postop day one she was doing well.  Her pain was controlled.  I felt she was stable for discharge.  We obtained a case management consult to get her approved for home wound VAC therapy.  I had extensively discussed her discharge medication pain management with the patient.  Also discussed her pain medicine regimen with her mother on the day of surgery.  The mother manages the patient's medications.  The measurement of the wound cavity was 15 x 15 x 8 cm.  I placed a black sponge during surgery into the wound.  The sponge was trimmed slightly.  The bigger piece was placed horizontal on the abdominal wall since the wound extended toward the right of the patient's abdomen.  I then placed a smaller piece on top of that for the subcutaneous tissue.  It will require changing every 3 days    Consults: case management  CBC Latest Ref Rng & Units 06/27/2019 06/12/2019 04/11/2019  WBC 4.0 - 10.5 K/uL 5.9 7.1 6.4  Hemoglobin 12.0 - 15.0  g/dL 11.9(L) 12.0 12.0  Hematocrit 36.0 - 46.0 % 37.8 38.4 38.5  Platelets 150 - 400 K/uL 309 271 305   BMP Latest Ref Rng & Units 06/27/2019 06/12/2019 04/11/2019  Glucose 70 - 99 mg/dL 91 98 90  BUN 6 - 20 mg/dL 6 10 5(L)  Creatinine 0.44 - 1.00 mg/dL 0.75 0.61 0.65  BUN/Creat Ratio 6 - 22 (calc) - - -  Sodium 135 - 145 mmol/L 138 140 138  Potassium 3.5 - 5.1 mmol/L 3.4(L) 3.6 3.3(L)  Chloride 98 - 111 mmol/L 105 108 103  CO2 22 - 32 mmol/L _0 Calcium 8.9 - 10.3 mg/dL 8.7(L) 8.8(L) 9.1     Treatments: analgesia: acetaminophen, Morphine and oxycodone and surgery:   Discharge Exam: Blood pressure 130/83, pulse 87, temperature 97.8 F (36.6 C), temperature source Oral, resp. rate 18, weight 116.6 kg, last menstrual period 06/23/2019, SpO2 95 %. BP 130/83 (BP Location: Right Arm)   Pulse 87   Temp 97.8 F (36.6 C) (Oral)   Resp 18   Wt 116.6 kg   LMP 06/23/2019 (Approximate)   SpO2 95%   BMI 42.77 kg/m   Gen: alert, NAD, non-toxic appearing Pupils: equal, no scleral icterus Pulm: Lungs clear to auscultation, symmetric chest rise CV: regular rate and rhythm Abd: soft, mild approp tender, nondistended. Abdominal wound vac intact/secure. No cellulitis. No incisional hernia Ext: no edema, no calf tenderness Skin: no rash, no jaundice   Disposition: Discharge disposition: 06-Home-Health Care Svc      RESUME PLAVIX ON Monday MAY 10 Discharge Instructions    Call MD for:  Complete by: As directed    Temperature >101   Call MD for:  hives   Complete by: As directed    Call MD for:  persistant dizziness or light-headedness   Complete by: As directed    Call MD for:  persistant nausea and vomiting   Complete by: As directed    Call MD for:  redness, tenderness, or signs of infection (pain, swelling, redness, odor or green/yellow discharge around incision site)   Complete by: As directed    Call MD for:  severe uncontrolled pain   Complete by: As directed    Diet  general   Complete by: As directed    Discharge instructions   Complete by: As directed    See [facility] discharge instructions   Discharge wound care:   Complete by: As directed    Change wound vac M/W/F OR Tues/thurs/sat. Use large black sponge in midline abdominal wound and cover with plastic drapes per protocol   Increase activity slowly   Complete by: As directed      Allergies as of 06/30/2019   No Known Allergies     Medication List    TAKE these medications   acetaminophen 500 MG tablet Commonly known as: TYLENOL Take 2 tablets (1,000 mg total) by mouth every 8 (eight) hours for 7 days. What changed:   how much to take  when to take this  reasons to take this   amphetamine-dextroamphetamine 15 MG tablet Commonly known as: ADDERALL Take 15 mg by mouth 3 (three) times daily.   atenolol 25 MG tablet Commonly known as: TENORMIN Take 25 mg by mouth 2 (two) times daily.   atorvastatin 40 MG tablet Commonly known as: LIPITOR TAKE 1 TABLET BY MOUTH EVERY DAY   B-12 Compliance Injection 1000 MCG/ML Kit Generic drug: Cyanocobalamin Inject 1,000 mcg as directed every 30 (thirty) days.   benztropine 2 MG tablet Commonly known as: COGENTIN Take 2 mg by mouth 2 (two) times daily.   calcium carbonate 1250 (500 Ca) MG tablet Commonly known as: OS-CAL - dosed in mg of elemental calcium Take 1 tablet by mouth daily.   clopidogrel 75 MG tablet Commonly known as: PLAVIX Take 75 mg by mouth daily.   ferrous sulfate 325 (65 FE) MG tablet Take 325 mg by mouth daily with breakfast.   fexofenadine 180 MG tablet Commonly known as: ALLEGRA Take 180 mg by mouth daily as needed for rhinitis (sore throat).   gabapentin 300 MG capsule Commonly known as: NEURONTIN Take 300 mg by mouth 3 (three) times daily.   haloperidol 5 MG tablet Commonly known as: HALDOL Take 5-10 mg by mouth See admin instructions. 5 mg in the morning, 10 mg at bedtime   LORazepam 1 MG tablet Commonly  known as: ATIVAN Take 1-2 mg by mouth See admin instructions. 1 tab twice daily, 2 mg at bedtime   multivitamin with minerals tablet Take 1 tablet by mouth daily.   Nuvigil 250 MG tablet Generic drug: Armodafinil Take 250 mg by mouth daily.   ondansetron 4 MG disintegrating tablet Commonly known as: Zofran ODT Take 1 tablet (4 mg total) by mouth every 8 (eight) hours as needed for nausea or vomiting.   oxyCODONE 5 MG immediate release tablet Commonly known as: Oxy IR/ROXICODONE Take 1 tablet (5 mg total) by mouth every 4 (four) hours as needed for up to 7 days for severe pain.   pantoprazole 40 MG tablet Commonly known as: PROTONIX Take 1 tablet (40  mg total) by mouth daily.   QUEtiapine 300 MG tablet Commonly known as: SEROQUEL Take 600 mg by mouth at bedtime.   topiramate 100 MG tablet Commonly known as: TOPAMAX Take 100 mg by mouth at bedtime.   valACYclovir 500 MG tablet Commonly known as: VALTREX Take 1 tablet (500 mg total) by mouth daily as needed (fever blister). What changed: how much to take   Vitamin D 50 MCG (2000 UT) tablet Take 2,000 Units by mouth daily.            Discharge Care Instructions  (From admission, onward)         Start     Ordered   06/30/19 0000  Discharge wound care:    Comments: Change wound vac M/W/F OR Tues/thurs/sat. Use large black sponge in midline abdominal wound and cover with plastic drapes per protocol   06/30/19 1023         Follow-up Information    Greer Pickerel, MD Follow up in 2 week(s).   Specialty: General Surgery Why: for wound check Contact information: [address] [place] [address] [phone]           Signed: Greer Pickerel 06/30/2019, 10:24 AM

## 2019-07-01 DIAGNOSIS — D869 Sarcoidosis, unspecified: Secondary | ICD-10-CM | POA: Diagnosis not present

## 2019-07-01 DIAGNOSIS — Z8673 Personal history of transient ischemic attack (TIA), and cerebral infarction without residual deficits: Secondary | ICD-10-CM | POA: Diagnosis not present

## 2019-07-01 DIAGNOSIS — F429 Obsessive-compulsive disorder, unspecified: Secondary | ICD-10-CM | POA: Diagnosis not present

## 2019-07-01 DIAGNOSIS — Z7901 Long term (current) use of anticoagulants: Secondary | ICD-10-CM | POA: Diagnosis not present

## 2019-07-01 DIAGNOSIS — R918 Other nonspecific abnormal finding of lung field: Secondary | ICD-10-CM | POA: Diagnosis not present

## 2019-07-01 DIAGNOSIS — K91872 Postprocedural seroma of a digestive system organ or structure following a digestive system procedure: Secondary | ICD-10-CM | POA: Diagnosis not present

## 2019-07-01 DIAGNOSIS — G4730 Sleep apnea, unspecified: Secondary | ICD-10-CM | POA: Diagnosis not present

## 2019-07-01 DIAGNOSIS — F319 Bipolar disorder, unspecified: Secondary | ICD-10-CM | POA: Diagnosis not present

## 2019-07-01 DIAGNOSIS — Z87442 Personal history of urinary calculi: Secondary | ICD-10-CM | POA: Diagnosis not present

## 2019-07-01 DIAGNOSIS — D509 Iron deficiency anemia, unspecified: Secondary | ICD-10-CM | POA: Diagnosis not present

## 2019-07-01 DIAGNOSIS — F419 Anxiety disorder, unspecified: Secondary | ICD-10-CM | POA: Diagnosis not present

## 2019-07-01 DIAGNOSIS — F209 Schizophrenia, unspecified: Secondary | ICD-10-CM | POA: Diagnosis not present

## 2019-07-01 DIAGNOSIS — F988 Other specified behavioral and emotional disorders with onset usually occurring in childhood and adolescence: Secondary | ICD-10-CM | POA: Diagnosis not present

## 2019-07-01 DIAGNOSIS — Z9884 Bariatric surgery status: Secondary | ICD-10-CM | POA: Diagnosis not present

## 2019-07-01 DIAGNOSIS — K219 Gastro-esophageal reflux disease without esophagitis: Secondary | ICD-10-CM | POA: Diagnosis not present

## 2019-07-03 DIAGNOSIS — F209 Schizophrenia, unspecified: Secondary | ICD-10-CM | POA: Diagnosis not present

## 2019-07-03 DIAGNOSIS — K91872 Postprocedural seroma of a digestive system organ or structure following a digestive system procedure: Secondary | ICD-10-CM | POA: Diagnosis not present

## 2019-07-03 DIAGNOSIS — D869 Sarcoidosis, unspecified: Secondary | ICD-10-CM | POA: Diagnosis not present

## 2019-07-03 DIAGNOSIS — D509 Iron deficiency anemia, unspecified: Secondary | ICD-10-CM | POA: Diagnosis not present

## 2019-07-03 DIAGNOSIS — F419 Anxiety disorder, unspecified: Secondary | ICD-10-CM | POA: Diagnosis not present

## 2019-07-03 DIAGNOSIS — F319 Bipolar disorder, unspecified: Secondary | ICD-10-CM | POA: Diagnosis not present

## 2019-07-04 DIAGNOSIS — D869 Sarcoidosis, unspecified: Secondary | ICD-10-CM | POA: Diagnosis not present

## 2019-07-04 DIAGNOSIS — F419 Anxiety disorder, unspecified: Secondary | ICD-10-CM | POA: Diagnosis not present

## 2019-07-04 DIAGNOSIS — D509 Iron deficiency anemia, unspecified: Secondary | ICD-10-CM | POA: Diagnosis not present

## 2019-07-04 DIAGNOSIS — F209 Schizophrenia, unspecified: Secondary | ICD-10-CM | POA: Diagnosis not present

## 2019-07-04 DIAGNOSIS — F319 Bipolar disorder, unspecified: Secondary | ICD-10-CM | POA: Diagnosis not present

## 2019-07-04 DIAGNOSIS — K91872 Postprocedural seroma of a digestive system organ or structure following a digestive system procedure: Secondary | ICD-10-CM | POA: Diagnosis not present

## 2019-07-07 DIAGNOSIS — D509 Iron deficiency anemia, unspecified: Secondary | ICD-10-CM | POA: Diagnosis not present

## 2019-07-07 DIAGNOSIS — R509 Fever, unspecified: Secondary | ICD-10-CM | POA: Diagnosis not present

## 2019-07-07 DIAGNOSIS — D869 Sarcoidosis, unspecified: Secondary | ICD-10-CM | POA: Diagnosis not present

## 2019-07-07 DIAGNOSIS — F209 Schizophrenia, unspecified: Secondary | ICD-10-CM | POA: Diagnosis not present

## 2019-07-07 DIAGNOSIS — F419 Anxiety disorder, unspecified: Secondary | ICD-10-CM | POA: Diagnosis not present

## 2019-07-07 DIAGNOSIS — K91872 Postprocedural seroma of a digestive system organ or structure following a digestive system procedure: Secondary | ICD-10-CM | POA: Diagnosis not present

## 2019-07-07 DIAGNOSIS — F319 Bipolar disorder, unspecified: Secondary | ICD-10-CM | POA: Diagnosis not present

## 2019-07-10 DIAGNOSIS — G8928 Other chronic postprocedural pain: Secondary | ICD-10-CM | POA: Diagnosis not present

## 2019-07-10 DIAGNOSIS — Z79891 Long term (current) use of opiate analgesic: Secondary | ICD-10-CM | POA: Diagnosis not present

## 2019-07-10 DIAGNOSIS — E669 Obesity, unspecified: Secondary | ICD-10-CM | POA: Diagnosis not present

## 2019-07-10 DIAGNOSIS — D869 Sarcoidosis, unspecified: Secondary | ICD-10-CM | POA: Diagnosis not present

## 2019-07-10 DIAGNOSIS — R109 Unspecified abdominal pain: Secondary | ICD-10-CM | POA: Diagnosis not present

## 2019-07-10 DIAGNOSIS — F419 Anxiety disorder, unspecified: Secondary | ICD-10-CM | POA: Diagnosis not present

## 2019-07-10 DIAGNOSIS — K91872 Postprocedural seroma of a digestive system organ or structure following a digestive system procedure: Secondary | ICD-10-CM | POA: Diagnosis not present

## 2019-07-10 DIAGNOSIS — I10 Essential (primary) hypertension: Secondary | ICD-10-CM | POA: Diagnosis not present

## 2019-07-10 DIAGNOSIS — G894 Chronic pain syndrome: Secondary | ICD-10-CM | POA: Diagnosis not present

## 2019-07-10 DIAGNOSIS — D509 Iron deficiency anemia, unspecified: Secondary | ICD-10-CM | POA: Diagnosis not present

## 2019-07-10 DIAGNOSIS — F319 Bipolar disorder, unspecified: Secondary | ICD-10-CM | POA: Diagnosis not present

## 2019-07-10 DIAGNOSIS — F209 Schizophrenia, unspecified: Secondary | ICD-10-CM | POA: Diagnosis not present

## 2019-07-12 DIAGNOSIS — F319 Bipolar disorder, unspecified: Secondary | ICD-10-CM | POA: Diagnosis not present

## 2019-07-12 DIAGNOSIS — D509 Iron deficiency anemia, unspecified: Secondary | ICD-10-CM | POA: Diagnosis not present

## 2019-07-12 DIAGNOSIS — F419 Anxiety disorder, unspecified: Secondary | ICD-10-CM | POA: Diagnosis not present

## 2019-07-12 DIAGNOSIS — D869 Sarcoidosis, unspecified: Secondary | ICD-10-CM | POA: Diagnosis not present

## 2019-07-12 DIAGNOSIS — K91872 Postprocedural seroma of a digestive system organ or structure following a digestive system procedure: Secondary | ICD-10-CM | POA: Diagnosis not present

## 2019-07-12 DIAGNOSIS — F209 Schizophrenia, unspecified: Secondary | ICD-10-CM | POA: Diagnosis not present

## 2019-07-14 ENCOUNTER — Telehealth: Payer: Self-pay

## 2019-07-14 ENCOUNTER — Other Ambulatory Visit: Payer: Medicare Other

## 2019-07-14 DIAGNOSIS — D509 Iron deficiency anemia, unspecified: Secondary | ICD-10-CM | POA: Diagnosis not present

## 2019-07-14 DIAGNOSIS — F209 Schizophrenia, unspecified: Secondary | ICD-10-CM | POA: Diagnosis not present

## 2019-07-14 DIAGNOSIS — K91872 Postprocedural seroma of a digestive system organ or structure following a digestive system procedure: Secondary | ICD-10-CM | POA: Diagnosis not present

## 2019-07-14 DIAGNOSIS — F319 Bipolar disorder, unspecified: Secondary | ICD-10-CM | POA: Diagnosis not present

## 2019-07-14 DIAGNOSIS — D869 Sarcoidosis, unspecified: Secondary | ICD-10-CM | POA: Diagnosis not present

## 2019-07-14 DIAGNOSIS — F419 Anxiety disorder, unspecified: Secondary | ICD-10-CM | POA: Diagnosis not present

## 2019-07-14 NOTE — Telephone Encounter (Signed)
Agree with covid test.  NTBS next week if covid test is negative

## 2019-07-14 NOTE — Telephone Encounter (Signed)
Pt called stating that she started running a fever 2 weeks ago. Fever was 101. Pt called her surgeon and he ordered blood work that came back normal. Pt woke up again this morning 05/21 running a fever. Pt stated that she took 2 tylenol and she is going to get covid tested.

## 2019-07-17 DIAGNOSIS — K91872 Postprocedural seroma of a digestive system organ or structure following a digestive system procedure: Secondary | ICD-10-CM | POA: Diagnosis not present

## 2019-07-17 DIAGNOSIS — F319 Bipolar disorder, unspecified: Secondary | ICD-10-CM | POA: Diagnosis not present

## 2019-07-17 DIAGNOSIS — D869 Sarcoidosis, unspecified: Secondary | ICD-10-CM | POA: Diagnosis not present

## 2019-07-17 DIAGNOSIS — F419 Anxiety disorder, unspecified: Secondary | ICD-10-CM | POA: Diagnosis not present

## 2019-07-17 DIAGNOSIS — D509 Iron deficiency anemia, unspecified: Secondary | ICD-10-CM | POA: Diagnosis not present

## 2019-07-17 DIAGNOSIS — F209 Schizophrenia, unspecified: Secondary | ICD-10-CM | POA: Diagnosis not present

## 2019-07-19 DIAGNOSIS — D869 Sarcoidosis, unspecified: Secondary | ICD-10-CM | POA: Diagnosis not present

## 2019-07-19 DIAGNOSIS — F419 Anxiety disorder, unspecified: Secondary | ICD-10-CM | POA: Diagnosis not present

## 2019-07-19 DIAGNOSIS — D509 Iron deficiency anemia, unspecified: Secondary | ICD-10-CM | POA: Diagnosis not present

## 2019-07-19 DIAGNOSIS — F209 Schizophrenia, unspecified: Secondary | ICD-10-CM | POA: Diagnosis not present

## 2019-07-19 DIAGNOSIS — F319 Bipolar disorder, unspecified: Secondary | ICD-10-CM | POA: Diagnosis not present

## 2019-07-19 DIAGNOSIS — K91872 Postprocedural seroma of a digestive system organ or structure following a digestive system procedure: Secondary | ICD-10-CM | POA: Diagnosis not present

## 2019-07-20 DIAGNOSIS — F319 Bipolar disorder, unspecified: Secondary | ICD-10-CM | POA: Diagnosis not present

## 2019-07-20 DIAGNOSIS — R251 Tremor, unspecified: Secondary | ICD-10-CM | POA: Diagnosis not present

## 2019-07-20 DIAGNOSIS — F4010 Social phobia, unspecified: Secondary | ICD-10-CM | POA: Diagnosis not present

## 2019-07-21 DIAGNOSIS — D509 Iron deficiency anemia, unspecified: Secondary | ICD-10-CM | POA: Diagnosis not present

## 2019-07-21 DIAGNOSIS — K91872 Postprocedural seroma of a digestive system organ or structure following a digestive system procedure: Secondary | ICD-10-CM | POA: Diagnosis not present

## 2019-07-21 DIAGNOSIS — D869 Sarcoidosis, unspecified: Secondary | ICD-10-CM | POA: Diagnosis not present

## 2019-07-21 DIAGNOSIS — F419 Anxiety disorder, unspecified: Secondary | ICD-10-CM | POA: Diagnosis not present

## 2019-07-21 DIAGNOSIS — F209 Schizophrenia, unspecified: Secondary | ICD-10-CM | POA: Diagnosis not present

## 2019-07-21 DIAGNOSIS — F319 Bipolar disorder, unspecified: Secondary | ICD-10-CM | POA: Diagnosis not present

## 2019-07-24 DIAGNOSIS — F319 Bipolar disorder, unspecified: Secondary | ICD-10-CM | POA: Diagnosis not present

## 2019-07-24 DIAGNOSIS — K91872 Postprocedural seroma of a digestive system organ or structure following a digestive system procedure: Secondary | ICD-10-CM | POA: Diagnosis not present

## 2019-07-24 DIAGNOSIS — F209 Schizophrenia, unspecified: Secondary | ICD-10-CM | POA: Diagnosis not present

## 2019-07-24 DIAGNOSIS — D869 Sarcoidosis, unspecified: Secondary | ICD-10-CM | POA: Diagnosis not present

## 2019-07-24 DIAGNOSIS — D509 Iron deficiency anemia, unspecified: Secondary | ICD-10-CM | POA: Diagnosis not present

## 2019-07-24 DIAGNOSIS — F419 Anxiety disorder, unspecified: Secondary | ICD-10-CM | POA: Diagnosis not present

## 2019-07-25 ENCOUNTER — Other Ambulatory Visit: Payer: Self-pay

## 2019-07-25 ENCOUNTER — Encounter: Payer: Self-pay | Admitting: Family Medicine

## 2019-07-25 ENCOUNTER — Ambulatory Visit (INDEPENDENT_AMBULATORY_CARE_PROVIDER_SITE_OTHER): Payer: Medicare Other | Admitting: Family Medicine

## 2019-07-25 VITALS — BP 120/80 | HR 85 | Temp 99.3°F | Ht 65.5 in | Wt 251.0 lb

## 2019-07-25 DIAGNOSIS — D869 Sarcoidosis, unspecified: Secondary | ICD-10-CM

## 2019-07-25 DIAGNOSIS — E538 Deficiency of other specified B group vitamins: Secondary | ICD-10-CM | POA: Diagnosis not present

## 2019-07-25 DIAGNOSIS — Z1231 Encounter for screening mammogram for malignant neoplasm of breast: Secondary | ICD-10-CM

## 2019-07-25 NOTE — Progress Notes (Signed)
Subjective:    Patient ID: [name], female    DOB: [birth year], 44 y.o.   MRN: [id number]  HPI 02/2019 Patient recently had a CT scan of the abdomen and pelvis to evaluate for resolution of her intra-abdominal abscess.  There was a coincidental finding of pulmonary nodules with lymphadenopathy.  A CT scan of the chest was recommended.  The results of the CT scan of the chest are dictated below. IMPRESSION: 1. Multiple pulmonary nodules scattered throughout the lungs bilaterally, in addition to extensive mediastinal and bilateral hilar lymphadenopathy. The appearance is nonspecific, but is favored to represent a systemic disease such as sarcoidosis. Alternatively, metastatic disease or lymphoproliferative disorder is not entirely excluded. Further clinical evaluation is recommended.  Patient is here today to discuss.  She does report a history of asthma and occasionally has wheezing.  She denies any rash on her elbows or extremities.  She denies any family history of sarcoidosis.  At that time, my plan was: Patient's findings are nonspecific however differential diagnosis most likely is sarcoidosis versus lymphoproliferative disorder.  Less likely to be some type of metastatic malignancy.  I believe the easiest route for biopsy would be bronchoscopy.  Therefore I will consult pulmonary to see if they feel they can biopsy one of the mediastinal lymph nodes via bronchoscopy or whether we would need to consult CVTS.  07/25/19 Patient had several biopsies performed in February via bronchoscopy.  Cytology revealed granulomatous inflammation and no malignant cells.  This would be consistent with a diagnosis of sarcoidosis.  Patient is here today requesting a mammogram.  She states that this was recommended to her by her general surgeon.  Patient is due for breast cancer screening independent of her sarcoidosis and enlarged lymph nodes.  Therefore I will be glad to schedule the patient for  mammogram.  She is also due for her B12 shot.  Patient is currently not on any inhaled corticosteroids.  Her last saw pulmonology in March.  At that time she had finished a prednisone taper pack and her breathing had improved.  She denies any shortness of breath or wheezing today and her pulmonary exam is normal.  She is hesitant to take any inhaled corticosteroids.  She prefers to take prednisone when necessary.  I explained to the patient the rationale behind taking a daily inhaled corticosteroid to reduce the dependency on oral steroids due to their potential long-term risk including osteoporosis and diabetes and weight gain.  Patient is currently seeing pain management through the care of Dr. Merlene Laughter. Past Medical History:  Diagnosis Date  . ADHD   . Anal fistula   . Anxiety   . Arthritis    pt denies 5/3 visit   . Bipolar affective ([place])   . Blood transfusion without reported diagnosis   . Colostomy in place [facility])   . CVA (cerebrovascular accident) ([place])    left cerebellar infarct found accidentally on CT (2020)   . Depression   . Diverticulitis 2008   perforated/ requiring resection  . Fatty liver   . Gallstones   . GERD (gastroesophageal reflux disease)    occ.  Marland Kitchen Headache    after a fall  . Herpes simplex   . History of colon polyps   . History of hiatal hernia    small  . History of kidney stones    cystoscopy  basket removal  . Hypertension   . Iron deficiency anemia   . Obese   . OCD (obsessive compulsive  disorder)   . Panic attacks   . Pulmonary nodules   . Sarcoidosis   . Schizophrenia ([place])   . Shortness of breath dyspnea    pt denies on 5/3 visit   . Sleep apnea    mild, does not use c-pap machine  . Substance abuse ([place])    12 years ago-crack cocaine   Current Outpatient Medications on File Prior to Visit  Medication Sig Dispense Refill  . amphetamine-dextroamphetamine (ADDERALL) 15 MG tablet Take 15 mg by mouth 3 (three) times daily.     Marland Kitchen atenolol  (TENORMIN) 25 MG tablet Take 25 mg by mouth 2 (two) times daily.     Marland Kitchen atorvastatin (LIPITOR) 40 MG tablet TAKE 1 TABLET BY MOUTH EVERY DAY (Patient taking differently: Take 40 mg by mouth daily. ) 90 tablet 1  . calcium carbonate (OS-CAL - DOSED IN MG OF ELEMENTAL CALCIUM) 1250 (500 Ca) MG tablet Take 1 tablet by mouth daily.    . Cholecalciferol (VITAMIN D) 50 MCG (2000 UT) tablet Take 2,000 Units by mouth daily.    . clopidogrel (PLAVIX) 75 MG tablet Take 75 mg by mouth daily.    . Cyanocobalamin (B-12 COMPLIANCE INJECTION) 1000 MCG/ML KIT Inject 1,000 mcg as directed every 30 (thirty) days.    . ferrous sulfate 325 (65 FE) MG tablet Take 325 mg by mouth daily with breakfast.    . fexofenadine (ALLEGRA) 180 MG tablet Take 180 mg by mouth daily as needed for rhinitis (sore throat).    . gabapentin (NEURONTIN) 300 MG capsule Take 300 mg by mouth 3 (three) times daily.    Marland Kitchen LORazepam (ATIVAN) 1 MG tablet Take 1-2 mg by mouth See admin instructions. 1 tab twice daily, 2 mg at bedtime    . Multiple Vitamins-Minerals (MULTIVITAMIN WITH MINERALS) tablet Take 1 tablet by mouth daily.     Marland Kitchen NUVIGIL 250 MG tablet Take 250 mg by mouth daily.  5  . ondansetron (ZOFRAN ODT) 4 MG disintegrating tablet Take 1 tablet (4 mg total) by mouth every 8 (eight) hours as needed for nausea or vomiting. 20 tablet 0  . pantoprazole (PROTONIX) 40 MG tablet Take 1 tablet (40 mg total) by mouth daily. 30 tablet 11  . QUEtiapine (SEROQUEL) 300 MG tablet Take 600 mg by mouth at bedtime.     . topiramate (TOPAMAX) 100 MG tablet Take 100 mg by mouth at bedtime.     . valACYclovir (VALTREX) 500 MG tablet Take 1 tablet (500 mg total) by mouth daily as needed (fever blister). (Patient taking differently: Take 2,000 mg by mouth daily as needed (fever blister). ) 90 tablet 2  . oxyCODONE-acetaminophen (PERCOCET) 7.5-325 MG tablet Take 1 tablet by mouth 3 (three) times daily as needed.     Current Facility-Administered Medications on  File Prior to Visit  Medication Dose Route Frequency Provider Last Rate Last Admin  . cyanocobalamin ((VITAMIN B-12)) injection 1,000 mcg  1,000 mcg Intramuscular Q30 days Susy Frizzle, MD   1,000 mcg at 07/25/19 1041   No Known Allergies Social History   Socioeconomic History  . Marital status: Single    Spouse name: Not on file  . Number of children: 2  . Years of education: 40   . Highest education level: Not on file  Occupational History  . Occupation: Insurance claims handler: UNEMPLOYED  Tobacco Use  . Smoking status: Former Smoker    Packs/day: 0.50    Years: 14.00  Pack years: 7.00    Types: E-cigarettes    Quit date: 12/20/2013    Years since quitting: 5.5  . Smokeless tobacco: Never Used  . Tobacco comment: Vapor cigarettes.  Substance and Sexual Activity  . Alcohol use: No    Alcohol/week: 0.0 standard drinks  . Drug use: Never  . Sexual activity: Not Currently    Birth control/protection: Abstinence  Other Topics Concern  . Not on file  Social History Narrative  . Not on file   Social Determinants of Health   Financial Resource Strain:   . Difficulty of Paying Living Expenses:   Food Insecurity:   . Worried About Charity fundraiser in the Last Year:   . Arboriculturist in the Last Year:   Transportation Needs:   . Film/video editor (Medical):   Marland Kitchen Lack of Transportation (Non-Medical):   Physical Activity:   . Days of Exercise per Week:   . Minutes of Exercise per Session:   Stress:   . Feeling of Stress :   Social Connections:   . Frequency of Communication with Friends and Family:   . Frequency of Social Gatherings with Friends and Family:   . Attends Religious Services:   . Active Member of Clubs or Organizations:   . Attends Archivist Meetings:   Marland Kitchen Marital Status:   Intimate Partner Violence:   . Fear of Current or Ex-Partner:   . Emotionally Abused:   Marland Kitchen Physically Abused:   . Sexually Abused:      Review of  Systems  All other systems reviewed and are negative.      Objective:   Physical Exam Vitals reviewed.  Constitutional:      Appearance: She is obese.  Cardiovascular:     Rate and Rhythm: Normal rate and regular rhythm.     Heart sounds: Normal heart sounds.  Pulmonary:     Effort: Pulmonary effort is normal.     Breath sounds: Normal breath sounds.  Neurological:     Mental Status: She is alert.           Assessment & Plan:  Encounter for screening mammogram for malignant neoplasm of breast - Plan: MM Digital Screening  Sarcoidosis  Physical exam is normal today.  The patient does have a wound on her abdomen that is being allowed to heal through secondary intention.  I also explained to the patient that reducing her use of steroids would help hasten the resolution of the wound healing.  Patient will be scheduled for mammogram.  Also received her B12 shot today.

## 2019-07-26 DIAGNOSIS — K91872 Postprocedural seroma of a digestive system organ or structure following a digestive system procedure: Secondary | ICD-10-CM | POA: Diagnosis not present

## 2019-07-26 DIAGNOSIS — F419 Anxiety disorder, unspecified: Secondary | ICD-10-CM | POA: Diagnosis not present

## 2019-07-26 DIAGNOSIS — F209 Schizophrenia, unspecified: Secondary | ICD-10-CM | POA: Diagnosis not present

## 2019-07-26 DIAGNOSIS — D869 Sarcoidosis, unspecified: Secondary | ICD-10-CM | POA: Diagnosis not present

## 2019-07-26 DIAGNOSIS — D509 Iron deficiency anemia, unspecified: Secondary | ICD-10-CM | POA: Diagnosis not present

## 2019-07-26 DIAGNOSIS — F319 Bipolar disorder, unspecified: Secondary | ICD-10-CM | POA: Diagnosis not present

## 2019-07-28 DIAGNOSIS — F419 Anxiety disorder, unspecified: Secondary | ICD-10-CM | POA: Diagnosis not present

## 2019-07-28 DIAGNOSIS — D869 Sarcoidosis, unspecified: Secondary | ICD-10-CM | POA: Diagnosis not present

## 2019-07-28 DIAGNOSIS — F319 Bipolar disorder, unspecified: Secondary | ICD-10-CM | POA: Diagnosis not present

## 2019-07-28 DIAGNOSIS — F209 Schizophrenia, unspecified: Secondary | ICD-10-CM | POA: Diagnosis not present

## 2019-07-28 DIAGNOSIS — K91872 Postprocedural seroma of a digestive system organ or structure following a digestive system procedure: Secondary | ICD-10-CM | POA: Diagnosis not present

## 2019-07-28 DIAGNOSIS — D509 Iron deficiency anemia, unspecified: Secondary | ICD-10-CM | POA: Diagnosis not present

## 2019-07-31 DIAGNOSIS — K219 Gastro-esophageal reflux disease without esophagitis: Secondary | ICD-10-CM | POA: Diagnosis not present

## 2019-07-31 DIAGNOSIS — F988 Other specified behavioral and emotional disorders with onset usually occurring in childhood and adolescence: Secondary | ICD-10-CM | POA: Diagnosis not present

## 2019-07-31 DIAGNOSIS — G4730 Sleep apnea, unspecified: Secondary | ICD-10-CM | POA: Diagnosis not present

## 2019-07-31 DIAGNOSIS — Z8673 Personal history of transient ischemic attack (TIA), and cerebral infarction without residual deficits: Secondary | ICD-10-CM | POA: Diagnosis not present

## 2019-07-31 DIAGNOSIS — F429 Obsessive-compulsive disorder, unspecified: Secondary | ICD-10-CM | POA: Diagnosis not present

## 2019-07-31 DIAGNOSIS — D509 Iron deficiency anemia, unspecified: Secondary | ICD-10-CM | POA: Diagnosis not present

## 2019-07-31 DIAGNOSIS — F319 Bipolar disorder, unspecified: Secondary | ICD-10-CM | POA: Diagnosis not present

## 2019-07-31 DIAGNOSIS — K91872 Postprocedural seroma of a digestive system organ or structure following a digestive system procedure: Secondary | ICD-10-CM | POA: Diagnosis not present

## 2019-07-31 DIAGNOSIS — F209 Schizophrenia, unspecified: Secondary | ICD-10-CM | POA: Diagnosis not present

## 2019-07-31 DIAGNOSIS — F419 Anxiety disorder, unspecified: Secondary | ICD-10-CM | POA: Diagnosis not present

## 2019-07-31 DIAGNOSIS — Z7901 Long term (current) use of anticoagulants: Secondary | ICD-10-CM | POA: Diagnosis not present

## 2019-07-31 DIAGNOSIS — Z9884 Bariatric surgery status: Secondary | ICD-10-CM | POA: Diagnosis not present

## 2019-07-31 DIAGNOSIS — D869 Sarcoidosis, unspecified: Secondary | ICD-10-CM | POA: Diagnosis not present

## 2019-07-31 DIAGNOSIS — Z87442 Personal history of urinary calculi: Secondary | ICD-10-CM | POA: Diagnosis not present

## 2019-07-31 DIAGNOSIS — R918 Other nonspecific abnormal finding of lung field: Secondary | ICD-10-CM | POA: Diagnosis not present

## 2019-08-15 ENCOUNTER — Ambulatory Visit: Payer: Medicare Other | Admitting: Family Medicine

## 2019-08-30 DIAGNOSIS — D869 Sarcoidosis, unspecified: Secondary | ICD-10-CM | POA: Diagnosis not present

## 2019-08-30 DIAGNOSIS — F209 Schizophrenia, unspecified: Secondary | ICD-10-CM | POA: Diagnosis not present

## 2019-08-30 DIAGNOSIS — G4730 Sleep apnea, unspecified: Secondary | ICD-10-CM | POA: Diagnosis not present

## 2019-08-30 DIAGNOSIS — R918 Other nonspecific abnormal finding of lung field: Secondary | ICD-10-CM | POA: Diagnosis not present

## 2019-08-30 DIAGNOSIS — Z79891 Long term (current) use of opiate analgesic: Secondary | ICD-10-CM | POA: Diagnosis not present

## 2019-08-30 DIAGNOSIS — F419 Anxiety disorder, unspecified: Secondary | ICD-10-CM | POA: Diagnosis not present

## 2019-08-30 DIAGNOSIS — Z8673 Personal history of transient ischemic attack (TIA), and cerebral infarction without residual deficits: Secondary | ICD-10-CM | POA: Diagnosis not present

## 2019-08-30 DIAGNOSIS — F988 Other specified behavioral and emotional disorders with onset usually occurring in childhood and adolescence: Secondary | ICD-10-CM | POA: Diagnosis not present

## 2019-08-30 DIAGNOSIS — F429 Obsessive-compulsive disorder, unspecified: Secondary | ICD-10-CM | POA: Diagnosis not present

## 2019-08-30 DIAGNOSIS — Z7902 Long term (current) use of antithrombotics/antiplatelets: Secondary | ICD-10-CM | POA: Diagnosis not present

## 2019-08-30 DIAGNOSIS — K91872 Postprocedural seroma of a digestive system organ or structure following a digestive system procedure: Secondary | ICD-10-CM | POA: Diagnosis not present

## 2019-08-30 DIAGNOSIS — F319 Bipolar disorder, unspecified: Secondary | ICD-10-CM | POA: Diagnosis not present

## 2019-08-30 DIAGNOSIS — D509 Iron deficiency anemia, unspecified: Secondary | ICD-10-CM | POA: Diagnosis not present

## 2019-08-30 DIAGNOSIS — K219 Gastro-esophageal reflux disease without esophagitis: Secondary | ICD-10-CM | POA: Diagnosis not present

## 2019-08-30 DIAGNOSIS — Z9884 Bariatric surgery status: Secondary | ICD-10-CM | POA: Diagnosis not present

## 2019-08-30 DIAGNOSIS — Z87442 Personal history of urinary calculi: Secondary | ICD-10-CM | POA: Diagnosis not present

## 2019-08-31 DIAGNOSIS — F209 Schizophrenia, unspecified: Secondary | ICD-10-CM | POA: Diagnosis not present

## 2019-08-31 DIAGNOSIS — F319 Bipolar disorder, unspecified: Secondary | ICD-10-CM | POA: Diagnosis not present

## 2019-08-31 DIAGNOSIS — D509 Iron deficiency anemia, unspecified: Secondary | ICD-10-CM | POA: Diagnosis not present

## 2019-08-31 DIAGNOSIS — K91872 Postprocedural seroma of a digestive system organ or structure following a digestive system procedure: Secondary | ICD-10-CM | POA: Diagnosis not present

## 2019-08-31 DIAGNOSIS — F419 Anxiety disorder, unspecified: Secondary | ICD-10-CM | POA: Diagnosis not present

## 2019-08-31 DIAGNOSIS — D869 Sarcoidosis, unspecified: Secondary | ICD-10-CM | POA: Diagnosis not present

## 2019-09-04 DIAGNOSIS — F319 Bipolar disorder, unspecified: Secondary | ICD-10-CM | POA: Diagnosis not present

## 2019-09-04 DIAGNOSIS — D869 Sarcoidosis, unspecified: Secondary | ICD-10-CM | POA: Diagnosis not present

## 2019-09-04 DIAGNOSIS — D509 Iron deficiency anemia, unspecified: Secondary | ICD-10-CM | POA: Diagnosis not present

## 2019-09-04 DIAGNOSIS — F209 Schizophrenia, unspecified: Secondary | ICD-10-CM | POA: Diagnosis not present

## 2019-09-04 DIAGNOSIS — F419 Anxiety disorder, unspecified: Secondary | ICD-10-CM | POA: Diagnosis not present

## 2019-09-04 DIAGNOSIS — K91872 Postprocedural seroma of a digestive system organ or structure following a digestive system procedure: Secondary | ICD-10-CM | POA: Diagnosis not present

## 2019-09-06 DIAGNOSIS — D509 Iron deficiency anemia, unspecified: Secondary | ICD-10-CM | POA: Diagnosis not present

## 2019-09-06 DIAGNOSIS — K91872 Postprocedural seroma of a digestive system organ or structure following a digestive system procedure: Secondary | ICD-10-CM | POA: Diagnosis not present

## 2019-09-06 DIAGNOSIS — Z79891 Long term (current) use of opiate analgesic: Secondary | ICD-10-CM | POA: Diagnosis not present

## 2019-09-06 DIAGNOSIS — R109 Unspecified abdominal pain: Secondary | ICD-10-CM | POA: Diagnosis not present

## 2019-09-06 DIAGNOSIS — E669 Obesity, unspecified: Secondary | ICD-10-CM | POA: Diagnosis not present

## 2019-09-06 DIAGNOSIS — F209 Schizophrenia, unspecified: Secondary | ICD-10-CM | POA: Diagnosis not present

## 2019-09-06 DIAGNOSIS — G8928 Other chronic postprocedural pain: Secondary | ICD-10-CM | POA: Diagnosis not present

## 2019-09-06 DIAGNOSIS — F319 Bipolar disorder, unspecified: Secondary | ICD-10-CM | POA: Diagnosis not present

## 2019-09-06 DIAGNOSIS — F419 Anxiety disorder, unspecified: Secondary | ICD-10-CM | POA: Diagnosis not present

## 2019-09-06 DIAGNOSIS — R519 Headache, unspecified: Secondary | ICD-10-CM | POA: Diagnosis not present

## 2019-09-06 DIAGNOSIS — D869 Sarcoidosis, unspecified: Secondary | ICD-10-CM | POA: Diagnosis not present

## 2019-09-08 ENCOUNTER — Encounter: Payer: Self-pay | Admitting: Emergency Medicine

## 2019-09-08 ENCOUNTER — Telehealth: Payer: Self-pay | Admitting: Emergency Medicine

## 2019-09-08 ENCOUNTER — Ambulatory Visit (INDEPENDENT_AMBULATORY_CARE_PROVIDER_SITE_OTHER)
Admission: RE | Admit: 2019-09-08 | Discharge: 2019-09-08 | Disposition: A | Discharge status: Home / Self Care | Payer: Medicare Other | Source: Ambulatory Visit | Attending: Emergency Medicine | Admitting: Emergency Medicine

## 2019-09-08 ENCOUNTER — Other Ambulatory Visit: Payer: Self-pay

## 2019-09-08 ENCOUNTER — Ambulatory Visit (INDEPENDENT_AMBULATORY_CARE_PROVIDER_SITE_OTHER): Payer: Medicare Other | Admitting: Emergency Medicine

## 2019-09-08 VITALS — BP 134/72 | HR 115 | Temp 98.7°F | Ht 66.0 in | Wt 247.2 lb

## 2019-09-08 DIAGNOSIS — D869 Sarcoidosis, unspecified: Secondary | ICD-10-CM | POA: Diagnosis not present

## 2019-09-08 DIAGNOSIS — F419 Anxiety disorder, unspecified: Secondary | ICD-10-CM | POA: Diagnosis not present

## 2019-09-08 DIAGNOSIS — R911 Solitary pulmonary nodule: Secondary | ICD-10-CM | POA: Diagnosis not present

## 2019-09-08 DIAGNOSIS — F209 Schizophrenia, unspecified: Secondary | ICD-10-CM | POA: Diagnosis not present

## 2019-09-08 DIAGNOSIS — K91872 Postprocedural seroma of a digestive system organ or structure following a digestive system procedure: Secondary | ICD-10-CM | POA: Diagnosis not present

## 2019-09-08 DIAGNOSIS — F319 Bipolar disorder, unspecified: Secondary | ICD-10-CM | POA: Diagnosis not present

## 2019-09-08 DIAGNOSIS — D509 Iron deficiency anemia, unspecified: Secondary | ICD-10-CM | POA: Diagnosis not present

## 2019-09-08 NOTE — Assessment & Plan Note (Signed)
Clinically stable following flare in March that was treated with prednisone.  She is not having dyspnea, cough at this time.  She is anxious, having some signs of her bipolar.  Certainly would like to avoid prednisone if possible given potential side effects.  I would like to repeat her CT scan of the chest to ensure stability of her lymphadenopathy, pulmonary nodule disease.  She does not want to do this right now, I think it is reasonable to wait until the 23-month point given her overall stability.  This would be January 2022.  I will get a chest x-ray today to ensure no new infiltrates.  We will repeat your chest x-ray today. We will plan to repeat your CT scan of the chest in the future to ensure that your sarcoidosis is stable.  We will probably do this in January 2022.  If you have any changes in your breathing, cough, any new symptoms we may decide to do so sooner. Follow with Dr Lamonte Sakai in 6 months to review your CT scan, or sooner if you have any problems

## 2019-09-08 NOTE — Telephone Encounter (Signed)
Please let the patient know that I reviewed her CXR. It is stable compared with her priors, no worrisome findings. Good news. Thanks.

## 2019-09-08 NOTE — Progress Notes (Signed)
Subjective:    Patient ID: [name], female    DOB: 1975/07/31, 44 y.o.   MRN: [id number]  HPI 44 year old obese woman, history of former tobacco use (16 pack years), she uses vapes now, with bipolar disease, diverticulitis with a prior perforation and resection, colostomy.  She had a subcutaneous abdominal wall abscess that required drainage and treatment. She still has a drain in place, recent output 60cc/day. Following with Dr Redmond Pulling at [place]. Most recent CT abd with improvement in her abscess / seroma. Prior CVA.   She has obstructive sleep apnea, not on CPAP.  She carries a history of asthma, has been seen by Dr. Melvyn Novas in our office for cough in 2013.  Pulmonary function testing from 11/09/2011 reviewed by me shows grossly normal airflows, no bronchodilator response, evidence for restriction based on a decreased RV, decreased diffusion capacity that corrects to the normal range when adjusted for her alveolar volume.   She has mediastinal lymphadenopathy and scattered pulmonary nodular disease originally identified by CT abdomen 2016.  CT chest 03/17/2019 reviewed by me shows multiple small pulmonary nodules scattered, solid in appearance, largest 7 mm in the right lower lobe, multiple enlarged mediastinal and bilateral hilar lymph nodes.  She hears wheezing both at rest and w exertion. Her exercise tolerance is limited. She is able to shop for groceries. Cannot walk quickly has to pace herself. She has albuterol - uses it rarely, does seem to help her wheeze.   She is usually on plavix for hx cerebellar CVA, not currently on this due to heme-positive stool. ASA and plavix both on hold.    ROV 09/08/19 --44 year old woman, former smoker (currently vapes) review bipolar disease, diverticular perforation repair, colostomy and subcutaneous abscess/seroma that has been addressed by surgery now with a wound vac in place, OSA (not on CPAP), cerebellar CVA.  She underwent EBUS for mediastinal  lymphadenopathy and bilateral pulmonary nodular disease 04/11/2019.  The pathology showed granulomatous inflammation consistent with sarcoidosis, culture negative.  She was treated for sarcoid flare in March 2021 with prednisone, cough improved. She is not having cough right, no SOB.   Currently on Protonix, off allegra.   Pulmonary function testing from 05/18/2019 reviewed by me, shows mixed restriction and obstruction, no bronchodilator response, normal diffusion capacity.  CT of her abdomen done 06/23/2019 reviewed by me, shows her percutaneous pigtail catheter in place, slight interval increase in size of her small residual seroma.  There are several small subcentimeter bilateral pulmonary nodules largest 7 mm in the right lower lobe, unchanged compared with prior.  Most recent CT scan of the chest was done 03/17/2019.    Review of Systems As per HPI     Objective:   Physical Exam  Vitals:   09/08/19 1205  BP: 134/72  Pulse: (!) 115  Temp: 98.7 F (37.1 C)  TempSrc: Oral  SpO2: 99%  Weight: 247 lb 3.2 oz (112.1 kg)  Height: 5\' 6"  (1.676 m)   Gen: Pleasant, obese woman, in no distress, tangential   ENT: No lesions,  mouth clear,  oropharynx clear, narrow post pharynx, no postnasal drip  Neck: No JVD, intermittent exp soft stridor  Lungs: No use of accessory muscles, no crackles or wheezing on normal respiration, no wheeze on forced expiration  Cardiovascular: RRR, heart sounds normal, no murmur or gallops, no peripheral edema  Abd: She has a wound VAC in place  Musculoskeletal: No deformities, no cyanosis or clubbing  Neuro: alert, awake, some pressured speech, tangential  but redirects.   Skin: Warm, no lesions or rash      Assessment & Plan:  Sarcoidosis Clinically stable following flare in March that was treated with prednisone.  She is not having dyspnea, cough at this time.  She is anxious, having some signs of her bipolar.  Certainly would like to avoid prednisone  if possible given potential side effects.  I would like to repeat her CT scan of the chest to ensure stability of her lymphadenopathy, pulmonary nodule disease.  She does not want to do this right now, I think it is reasonable to wait until the 11-month point given her overall stability.  This would be January 2022.  I will get a chest x-ray today to ensure no new infiltrates.  We will repeat your chest x-ray today. We will plan to repeat your CT scan of the chest in the future to ensure that your sarcoidosis is stable.  We will probably do this in January 2022.  If you have any changes in your breathing, cough, any new symptoms we may decide to do so sooner. Follow with Dr Lamonte Sakai in 6 months to review your CT scan, or sooner if you have any problems  Baltazar Apo, MD, PhD 09/08/2019, 12:33 PM [facility] [phone] or if no answer [phone]

## 2019-09-08 NOTE — Telephone Encounter (Signed)
Spoke with patient and reviewed results. Nothing further needed.

## 2019-09-08 NOTE — Patient Instructions (Signed)
We will repeat your chest x-ray today. We will plan to repeat your CT scan of the chest in the future to ensure that your sarcoidosis is stable.  We will probably do this in January 2022.  If you have any changes in your breathing, cough, any new symptoms we may decide to do so sooner. Follow with Dr Lamonte Sakai in 6 months to review your CT scan, or sooner if you have any problems

## 2019-09-11 DIAGNOSIS — D509 Iron deficiency anemia, unspecified: Secondary | ICD-10-CM | POA: Diagnosis not present

## 2019-09-11 DIAGNOSIS — F209 Schizophrenia, unspecified: Secondary | ICD-10-CM | POA: Diagnosis not present

## 2019-09-11 DIAGNOSIS — D869 Sarcoidosis, unspecified: Secondary | ICD-10-CM | POA: Diagnosis not present

## 2019-09-11 DIAGNOSIS — F419 Anxiety disorder, unspecified: Secondary | ICD-10-CM | POA: Diagnosis not present

## 2019-09-11 DIAGNOSIS — K91872 Postprocedural seroma of a digestive system organ or structure following a digestive system procedure: Secondary | ICD-10-CM | POA: Diagnosis not present

## 2019-09-11 DIAGNOSIS — F319 Bipolar disorder, unspecified: Secondary | ICD-10-CM | POA: Diagnosis not present

## 2019-09-13 ENCOUNTER — Other Ambulatory Visit: Payer: Self-pay | Admitting: Family Medicine

## 2019-09-13 DIAGNOSIS — D509 Iron deficiency anemia, unspecified: Secondary | ICD-10-CM | POA: Diagnosis not present

## 2019-09-13 DIAGNOSIS — D869 Sarcoidosis, unspecified: Secondary | ICD-10-CM | POA: Diagnosis not present

## 2019-09-13 DIAGNOSIS — F319 Bipolar disorder, unspecified: Secondary | ICD-10-CM | POA: Diagnosis not present

## 2019-09-13 DIAGNOSIS — K91872 Postprocedural seroma of a digestive system organ or structure following a digestive system procedure: Secondary | ICD-10-CM | POA: Diagnosis not present

## 2019-09-13 DIAGNOSIS — F419 Anxiety disorder, unspecified: Secondary | ICD-10-CM | POA: Diagnosis not present

## 2019-09-13 DIAGNOSIS — F209 Schizophrenia, unspecified: Secondary | ICD-10-CM | POA: Diagnosis not present

## 2019-09-15 DIAGNOSIS — F319 Bipolar disorder, unspecified: Secondary | ICD-10-CM | POA: Diagnosis not present

## 2019-09-15 DIAGNOSIS — K91872 Postprocedural seroma of a digestive system organ or structure following a digestive system procedure: Secondary | ICD-10-CM | POA: Diagnosis not present

## 2019-09-15 DIAGNOSIS — F209 Schizophrenia, unspecified: Secondary | ICD-10-CM | POA: Diagnosis not present

## 2019-09-15 DIAGNOSIS — D869 Sarcoidosis, unspecified: Secondary | ICD-10-CM | POA: Diagnosis not present

## 2019-09-15 DIAGNOSIS — F419 Anxiety disorder, unspecified: Secondary | ICD-10-CM | POA: Diagnosis not present

## 2019-09-15 DIAGNOSIS — D509 Iron deficiency anemia, unspecified: Secondary | ICD-10-CM | POA: Diagnosis not present

## 2019-09-16 ENCOUNTER — Other Ambulatory Visit: Payer: Self-pay | Admitting: Family Medicine

## 2019-09-18 DIAGNOSIS — F209 Schizophrenia, unspecified: Secondary | ICD-10-CM | POA: Diagnosis not present

## 2019-09-18 DIAGNOSIS — F419 Anxiety disorder, unspecified: Secondary | ICD-10-CM | POA: Diagnosis not present

## 2019-09-18 DIAGNOSIS — K91872 Postprocedural seroma of a digestive system organ or structure following a digestive system procedure: Secondary | ICD-10-CM | POA: Diagnosis not present

## 2019-09-18 DIAGNOSIS — D869 Sarcoidosis, unspecified: Secondary | ICD-10-CM | POA: Diagnosis not present

## 2019-09-18 DIAGNOSIS — D509 Iron deficiency anemia, unspecified: Secondary | ICD-10-CM | POA: Diagnosis not present

## 2019-09-18 DIAGNOSIS — F319 Bipolar disorder, unspecified: Secondary | ICD-10-CM | POA: Diagnosis not present

## 2019-09-19 ENCOUNTER — Other Ambulatory Visit: Payer: Self-pay

## 2019-09-19 ENCOUNTER — Telehealth: Payer: Self-pay

## 2019-09-19 DIAGNOSIS — D869 Sarcoidosis, unspecified: Secondary | ICD-10-CM | POA: Diagnosis not present

## 2019-09-19 DIAGNOSIS — F319 Bipolar disorder, unspecified: Secondary | ICD-10-CM | POA: Diagnosis not present

## 2019-09-19 DIAGNOSIS — F209 Schizophrenia, unspecified: Secondary | ICD-10-CM | POA: Diagnosis not present

## 2019-09-19 DIAGNOSIS — T8130XA Disruption of wound, unspecified, initial encounter: Secondary | ICD-10-CM

## 2019-09-19 DIAGNOSIS — F419 Anxiety disorder, unspecified: Secondary | ICD-10-CM | POA: Diagnosis not present

## 2019-09-19 DIAGNOSIS — K91872 Postprocedural seroma of a digestive system organ or structure following a digestive system procedure: Secondary | ICD-10-CM | POA: Diagnosis not present

## 2019-09-19 DIAGNOSIS — D509 Iron deficiency anemia, unspecified: Secondary | ICD-10-CM | POA: Diagnosis not present

## 2019-09-19 NOTE — Telephone Encounter (Signed)
Ok with referral. 

## 2019-09-19 NOTE — Telephone Encounter (Signed)
Pt called and wants a referral to [facility] wound care center in [place]. Pt states she is no better. Please advise.

## 2019-09-20 DIAGNOSIS — K91872 Postprocedural seroma of a digestive system organ or structure following a digestive system procedure: Secondary | ICD-10-CM | POA: Diagnosis not present

## 2019-09-20 DIAGNOSIS — F319 Bipolar disorder, unspecified: Secondary | ICD-10-CM | POA: Diagnosis not present

## 2019-09-20 DIAGNOSIS — F419 Anxiety disorder, unspecified: Secondary | ICD-10-CM | POA: Diagnosis not present

## 2019-09-20 DIAGNOSIS — D869 Sarcoidosis, unspecified: Secondary | ICD-10-CM | POA: Diagnosis not present

## 2019-09-20 DIAGNOSIS — D509 Iron deficiency anemia, unspecified: Secondary | ICD-10-CM | POA: Diagnosis not present

## 2019-09-20 DIAGNOSIS — F209 Schizophrenia, unspecified: Secondary | ICD-10-CM | POA: Diagnosis not present

## 2019-09-22 DIAGNOSIS — F319 Bipolar disorder, unspecified: Secondary | ICD-10-CM | POA: Diagnosis not present

## 2019-09-22 DIAGNOSIS — D509 Iron deficiency anemia, unspecified: Secondary | ICD-10-CM | POA: Diagnosis not present

## 2019-09-22 DIAGNOSIS — F419 Anxiety disorder, unspecified: Secondary | ICD-10-CM | POA: Diagnosis not present

## 2019-09-22 DIAGNOSIS — K91872 Postprocedural seroma of a digestive system organ or structure following a digestive system procedure: Secondary | ICD-10-CM | POA: Diagnosis not present

## 2019-09-22 DIAGNOSIS — F209 Schizophrenia, unspecified: Secondary | ICD-10-CM | POA: Diagnosis not present

## 2019-09-22 DIAGNOSIS — D869 Sarcoidosis, unspecified: Secondary | ICD-10-CM | POA: Diagnosis not present

## 2019-09-25 DIAGNOSIS — F419 Anxiety disorder, unspecified: Secondary | ICD-10-CM | POA: Diagnosis not present

## 2019-09-25 DIAGNOSIS — F319 Bipolar disorder, unspecified: Secondary | ICD-10-CM | POA: Diagnosis not present

## 2019-09-25 DIAGNOSIS — K91872 Postprocedural seroma of a digestive system organ or structure following a digestive system procedure: Secondary | ICD-10-CM | POA: Diagnosis not present

## 2019-09-25 DIAGNOSIS — D869 Sarcoidosis, unspecified: Secondary | ICD-10-CM | POA: Diagnosis not present

## 2019-09-25 DIAGNOSIS — D509 Iron deficiency anemia, unspecified: Secondary | ICD-10-CM | POA: Diagnosis not present

## 2019-09-25 DIAGNOSIS — F209 Schizophrenia, unspecified: Secondary | ICD-10-CM | POA: Diagnosis not present

## 2019-09-27 DIAGNOSIS — F419 Anxiety disorder, unspecified: Secondary | ICD-10-CM | POA: Diagnosis not present

## 2019-09-27 DIAGNOSIS — D509 Iron deficiency anemia, unspecified: Secondary | ICD-10-CM | POA: Diagnosis not present

## 2019-09-27 DIAGNOSIS — F319 Bipolar disorder, unspecified: Secondary | ICD-10-CM | POA: Diagnosis not present

## 2019-09-27 DIAGNOSIS — K91872 Postprocedural seroma of a digestive system organ or structure following a digestive system procedure: Secondary | ICD-10-CM | POA: Diagnosis not present

## 2019-09-27 DIAGNOSIS — F209 Schizophrenia, unspecified: Secondary | ICD-10-CM | POA: Diagnosis not present

## 2019-09-27 DIAGNOSIS — D869 Sarcoidosis, unspecified: Secondary | ICD-10-CM | POA: Diagnosis not present

## 2019-09-29 DIAGNOSIS — Z79891 Long term (current) use of opiate analgesic: Secondary | ICD-10-CM | POA: Diagnosis not present

## 2019-09-29 DIAGNOSIS — F988 Other specified behavioral and emotional disorders with onset usually occurring in childhood and adolescence: Secondary | ICD-10-CM | POA: Diagnosis not present

## 2019-09-29 DIAGNOSIS — Z87442 Personal history of urinary calculi: Secondary | ICD-10-CM | POA: Diagnosis not present

## 2019-09-29 DIAGNOSIS — Z8673 Personal history of transient ischemic attack (TIA), and cerebral infarction without residual deficits: Secondary | ICD-10-CM | POA: Diagnosis not present

## 2019-09-29 DIAGNOSIS — K91872 Postprocedural seroma of a digestive system organ or structure following a digestive system procedure: Secondary | ICD-10-CM | POA: Diagnosis not present

## 2019-09-29 DIAGNOSIS — D509 Iron deficiency anemia, unspecified: Secondary | ICD-10-CM | POA: Diagnosis not present

## 2019-09-29 DIAGNOSIS — D869 Sarcoidosis, unspecified: Secondary | ICD-10-CM | POA: Diagnosis not present

## 2019-09-29 DIAGNOSIS — K219 Gastro-esophageal reflux disease without esophagitis: Secondary | ICD-10-CM | POA: Diagnosis not present

## 2019-09-29 DIAGNOSIS — R918 Other nonspecific abnormal finding of lung field: Secondary | ICD-10-CM | POA: Diagnosis not present

## 2019-09-29 DIAGNOSIS — F319 Bipolar disorder, unspecified: Secondary | ICD-10-CM | POA: Diagnosis not present

## 2019-09-29 DIAGNOSIS — F429 Obsessive-compulsive disorder, unspecified: Secondary | ICD-10-CM | POA: Diagnosis not present

## 2019-09-29 DIAGNOSIS — F419 Anxiety disorder, unspecified: Secondary | ICD-10-CM | POA: Diagnosis not present

## 2019-09-29 DIAGNOSIS — G4730 Sleep apnea, unspecified: Secondary | ICD-10-CM | POA: Diagnosis not present

## 2019-09-29 DIAGNOSIS — Z9884 Bariatric surgery status: Secondary | ICD-10-CM | POA: Diagnosis not present

## 2019-09-29 DIAGNOSIS — F209 Schizophrenia, unspecified: Secondary | ICD-10-CM | POA: Diagnosis not present

## 2019-09-29 DIAGNOSIS — Z7902 Long term (current) use of antithrombotics/antiplatelets: Secondary | ICD-10-CM | POA: Diagnosis not present

## 2019-10-02 DIAGNOSIS — D509 Iron deficiency anemia, unspecified: Secondary | ICD-10-CM | POA: Diagnosis not present

## 2019-10-02 DIAGNOSIS — F419 Anxiety disorder, unspecified: Secondary | ICD-10-CM | POA: Diagnosis not present

## 2019-10-02 DIAGNOSIS — F209 Schizophrenia, unspecified: Secondary | ICD-10-CM | POA: Diagnosis not present

## 2019-10-02 DIAGNOSIS — F319 Bipolar disorder, unspecified: Secondary | ICD-10-CM | POA: Diagnosis not present

## 2019-10-02 DIAGNOSIS — K91872 Postprocedural seroma of a digestive system organ or structure following a digestive system procedure: Secondary | ICD-10-CM | POA: Diagnosis not present

## 2019-10-02 DIAGNOSIS — D869 Sarcoidosis, unspecified: Secondary | ICD-10-CM | POA: Diagnosis not present

## 2019-10-04 ENCOUNTER — Telehealth: Payer: Self-pay | Admitting: Emergency Medicine

## 2019-10-04 DIAGNOSIS — F319 Bipolar disorder, unspecified: Secondary | ICD-10-CM | POA: Diagnosis not present

## 2019-10-04 DIAGNOSIS — K91872 Postprocedural seroma of a digestive system organ or structure following a digestive system procedure: Secondary | ICD-10-CM | POA: Diagnosis not present

## 2019-10-04 DIAGNOSIS — D869 Sarcoidosis, unspecified: Secondary | ICD-10-CM | POA: Diagnosis not present

## 2019-10-04 DIAGNOSIS — D509 Iron deficiency anemia, unspecified: Secondary | ICD-10-CM | POA: Diagnosis not present

## 2019-10-04 DIAGNOSIS — F419 Anxiety disorder, unspecified: Secondary | ICD-10-CM | POA: Diagnosis not present

## 2019-10-04 DIAGNOSIS — F209 Schizophrenia, unspecified: Secondary | ICD-10-CM | POA: Diagnosis not present

## 2019-10-04 NOTE — Telephone Encounter (Signed)
Spoke with the pt  She is asking if she should get the covid vaccine  I advised that yes, unless she has had a severe reaction to vaccines in the past we rec that she get the vaccine  Nothing further needed

## 2019-10-04 NOTE — Telephone Encounter (Signed)
Patient is returning phone call. Patient phone number is [phone].

## 2019-10-04 NOTE — Telephone Encounter (Signed)
Pt returning call.  [phone]

## 2019-10-04 NOTE — Telephone Encounter (Signed)
Tried calling the pt and there was no answer- LMTCB x 1  

## 2019-10-05 DIAGNOSIS — Z23 Encounter for immunization: Secondary | ICD-10-CM | POA: Diagnosis not present

## 2019-10-06 DIAGNOSIS — F419 Anxiety disorder, unspecified: Secondary | ICD-10-CM | POA: Diagnosis not present

## 2019-10-06 DIAGNOSIS — F209 Schizophrenia, unspecified: Secondary | ICD-10-CM | POA: Diagnosis not present

## 2019-10-06 DIAGNOSIS — F319 Bipolar disorder, unspecified: Secondary | ICD-10-CM | POA: Diagnosis not present

## 2019-10-06 DIAGNOSIS — D869 Sarcoidosis, unspecified: Secondary | ICD-10-CM | POA: Diagnosis not present

## 2019-10-06 DIAGNOSIS — D509 Iron deficiency anemia, unspecified: Secondary | ICD-10-CM | POA: Diagnosis not present

## 2019-10-06 DIAGNOSIS — K91872 Postprocedural seroma of a digestive system organ or structure following a digestive system procedure: Secondary | ICD-10-CM | POA: Diagnosis not present

## 2019-10-09 DIAGNOSIS — Z79891 Long term (current) use of opiate analgesic: Secondary | ICD-10-CM | POA: Diagnosis not present

## 2019-10-09 DIAGNOSIS — F209 Schizophrenia, unspecified: Secondary | ICD-10-CM | POA: Diagnosis not present

## 2019-10-09 DIAGNOSIS — D509 Iron deficiency anemia, unspecified: Secondary | ICD-10-CM | POA: Diagnosis not present

## 2019-10-09 DIAGNOSIS — G8928 Other chronic postprocedural pain: Secondary | ICD-10-CM | POA: Diagnosis not present

## 2019-10-09 DIAGNOSIS — F419 Anxiety disorder, unspecified: Secondary | ICD-10-CM | POA: Diagnosis not present

## 2019-10-09 DIAGNOSIS — D869 Sarcoidosis, unspecified: Secondary | ICD-10-CM | POA: Diagnosis not present

## 2019-10-09 DIAGNOSIS — R109 Unspecified abdominal pain: Secondary | ICD-10-CM | POA: Diagnosis not present

## 2019-10-09 DIAGNOSIS — F319 Bipolar disorder, unspecified: Secondary | ICD-10-CM | POA: Diagnosis not present

## 2019-10-09 DIAGNOSIS — K91872 Postprocedural seroma of a digestive system organ or structure following a digestive system procedure: Secondary | ICD-10-CM | POA: Diagnosis not present

## 2019-10-09 DIAGNOSIS — R519 Headache, unspecified: Secondary | ICD-10-CM | POA: Diagnosis not present

## 2019-10-11 DIAGNOSIS — Z09 Encounter for follow-up examination after completed treatment for conditions other than malignant neoplasm: Secondary | ICD-10-CM | POA: Diagnosis not present

## 2019-10-11 DIAGNOSIS — Z9884 Bariatric surgery status: Secondary | ICD-10-CM | POA: Diagnosis not present

## 2019-10-11 DIAGNOSIS — K912 Postsurgical malabsorption, not elsewhere classified: Secondary | ICD-10-CM | POA: Diagnosis not present

## 2019-10-11 DIAGNOSIS — D869 Sarcoidosis, unspecified: Secondary | ICD-10-CM | POA: Diagnosis not present

## 2019-10-11 DIAGNOSIS — F419 Anxiety disorder, unspecified: Secondary | ICD-10-CM | POA: Diagnosis not present

## 2019-10-11 DIAGNOSIS — D508 Other iron deficiency anemias: Secondary | ICD-10-CM | POA: Diagnosis not present

## 2019-10-11 DIAGNOSIS — K91872 Postprocedural seroma of a digestive system organ or structure following a digestive system procedure: Secondary | ICD-10-CM | POA: Diagnosis not present

## 2019-10-11 DIAGNOSIS — F319 Bipolar disorder, unspecified: Secondary | ICD-10-CM | POA: Diagnosis not present

## 2019-10-11 DIAGNOSIS — E559 Vitamin D deficiency, unspecified: Secondary | ICD-10-CM | POA: Diagnosis not present

## 2019-10-11 DIAGNOSIS — F209 Schizophrenia, unspecified: Secondary | ICD-10-CM | POA: Diagnosis not present

## 2019-10-11 DIAGNOSIS — D509 Iron deficiency anemia, unspecified: Secondary | ICD-10-CM | POA: Diagnosis not present

## 2019-10-12 ENCOUNTER — Telehealth: Payer: Self-pay | Admitting: Family Medicine

## 2019-10-12 DIAGNOSIS — F319 Bipolar disorder, unspecified: Secondary | ICD-10-CM | POA: Diagnosis not present

## 2019-10-12 DIAGNOSIS — R251 Tremor, unspecified: Secondary | ICD-10-CM | POA: Diagnosis not present

## 2019-10-12 DIAGNOSIS — F4010 Social phobia, unspecified: Secondary | ICD-10-CM | POA: Diagnosis not present

## 2019-10-12 NOTE — Telephone Encounter (Signed)
CB# [phone] Pt  been exposed with covid would like for Dr.Pickard to order a Covid test in [place] ?

## 2019-10-12 NOTE — Telephone Encounter (Signed)
Patient states that she has made appointment for testing at this time.

## 2019-10-13 ENCOUNTER — Other Ambulatory Visit: Payer: Self-pay | Admitting: Sleep Medicine

## 2019-10-13 ENCOUNTER — Other Ambulatory Visit: Payer: Medicare Other

## 2019-10-13 DIAGNOSIS — Z20822 Contact with and (suspected) exposure to covid-19: Secondary | ICD-10-CM

## 2019-10-14 ENCOUNTER — Telehealth: Payer: Self-pay

## 2019-10-14 DIAGNOSIS — D869 Sarcoidosis, unspecified: Secondary | ICD-10-CM | POA: Diagnosis not present

## 2019-10-14 DIAGNOSIS — D509 Iron deficiency anemia, unspecified: Secondary | ICD-10-CM | POA: Diagnosis not present

## 2019-10-14 DIAGNOSIS — K91872 Postprocedural seroma of a digestive system organ or structure following a digestive system procedure: Secondary | ICD-10-CM | POA: Diagnosis not present

## 2019-10-14 DIAGNOSIS — F319 Bipolar disorder, unspecified: Secondary | ICD-10-CM | POA: Diagnosis not present

## 2019-10-14 DIAGNOSIS — F209 Schizophrenia, unspecified: Secondary | ICD-10-CM | POA: Diagnosis not present

## 2019-10-14 DIAGNOSIS — F419 Anxiety disorder, unspecified: Secondary | ICD-10-CM | POA: Diagnosis not present

## 2019-10-14 LAB — SPECIMEN STATUS REPORT

## 2019-10-14 LAB — SARS-COV-2, NAA 2 DAY TAT

## 2019-10-14 LAB — NOVEL CORONAVIRUS, NAA: SARS-CoV-2, NAA: NOT DETECTED

## 2019-10-14 NOTE — Telephone Encounter (Signed)
Pt called for covid results- advised pt results are not back - offered to get pt signed up for MyChart and pt declined.

## 2019-10-15 ENCOUNTER — Telehealth: Payer: Self-pay

## 2019-10-15 NOTE — Telephone Encounter (Signed)

## 2019-10-16 ENCOUNTER — Encounter (HOSPITAL_BASED_OUTPATIENT_CLINIC_OR_DEPARTMENT_OTHER): Payer: Medicare Other | Attending: Internal Medicine | Admitting: Internal Medicine

## 2019-10-16 DIAGNOSIS — I10 Essential (primary) hypertension: Secondary | ICD-10-CM | POA: Insufficient documentation

## 2019-10-16 DIAGNOSIS — T8131XA Disruption of external operation (surgical) wound, not elsewhere classified, initial encounter: Secondary | ICD-10-CM | POA: Insufficient documentation

## 2019-10-16 DIAGNOSIS — Y838 Other surgical procedures as the cause of abnormal reaction of the patient, or of later complication, without mention of misadventure at the time of the procedure: Secondary | ICD-10-CM | POA: Insufficient documentation

## 2019-10-16 DIAGNOSIS — S31105A Unspecified open wound of abdominal wall, periumbilic region without penetration into peritoneal cavity, initial encounter: Secondary | ICD-10-CM | POA: Diagnosis not present

## 2019-10-16 DIAGNOSIS — Y839 Surgical procedure, unspecified as the cause of abnormal reaction of the patient, or of later complication, without mention of misadventure at the time of the procedure: Secondary | ICD-10-CM | POA: Insufficient documentation

## 2019-10-17 ENCOUNTER — Telehealth: Payer: Self-pay | Admitting: Hematology and Oncology

## 2019-10-17 NOTE — Progress Notes (Signed)
Melissa, [name] ([id number]) Visit Report for 10/16/2019 Chief Complaint Document Details Patient Name: Date of Service: [name], [place] [name] 10/16/2019 2:45 PM Medical Record Number: [id number] Patient Account Number: [id number] Date of Birth/Sex: Treating RN: [birth year] (44 y.o. [name] Primary Care Provider: [place], Hillery Aldo Other Clinician: Referring Provider: Treating Provider/Extender: Johnella Moloney RD, [place] RREN Weeks in Treatment: 0 Information Obtained from: Patient Chief Complaint 10/16/2019; patient is here for review of a nonhealing abdominal wound Electronic Signature(s) Signed: 10/16/2019 4:33:08 PM By: Linton Ham MD Entered By: Linton Ham on 10/16/2019 16:10:27 -------------------------------------------------------------------------------- HPI Details Patient Name: Date of Service: Melissa Spray, [place] [name] 10/16/2019 2:45 PM Medical Record Number: [id number] Patient Account Number: [id number] Date of Birth/Sex: Treating RN: 1975/03/10 (44 y.o. [name] Primary Care Provider: Elio Forget RD, Hillery Aldo Other Clinician: Referring Provider: Treating Provider/Extender: Johnella Moloney RD, [place] RREN Weeks in Treatment: 0 History of Present Illness HPI Description: ADMISSION 10/16/2019 This is a 44 year old woman who apparently had a perforated diverticulum in the mid 2000's. She required a colostomy and then reversal. She did well up until 2009 she required an incisional hernia repair at an outside hospital. She had a gastric sleeve procedure as well in 2015. Sometime after this she developed an abdominal seroma. She had abdominal drains as initial management. She had drainage of the seroma cavity in 2016 in the OR. This resulted in resolution of the seroma cavity for a long period of time. She represented in December of last year year with a large amount of fluid. Her last CT scan was in April noted that she had a small residual fluid collection.  However because of continued drainage it was elected that she would go ahead with a surgical procedure for debridement of the seroma cavity. She was taken to the OR on Jun 29, 2019. The measurements of the area were 15 x 15 x 3 at that point. A wound VAC was placed postoperatively. She tells me she loses a lot of blood in the wound VAC and she is iron deficient. Last hemoglobin I see was 11.9 on 06/27/2019. Her albumin was normal. The patient comes in the clinic here very frustrated with the lack of progress. She says the wound is not healed at all with the wound VAC. She continues to have what she describes as a large amount of sanguinous drainage and she changes the canister every day. She is not having any pain or fever. She is wondering what her options are given the fact that she believes the wound VAC to be ineffective. Past medical history includes bipolar disorder, sarcoidosis, TIA, perforated diverticulum in the mid 2000's, incisional hernia repair in 2009, gastric sleeve procedure in 2015, hypertension Electronic Signature(s) Signed: 10/16/2019 4:33:08 PM By: Linton Ham MD Entered By: Linton Ham on 10/16/2019 16:19:40 -------------------------------------------------------------------------------- Physical Exam Details Patient Name: Date of Service: Melissa Spray, MA [name] 10/16/2019 2:45 PM Medical Record Number: [id number] Patient Account Number: [id number] Date of Birth/Sex: Treating RN: December 30, 1975 (45 y.o. [name] Primary Care Provider: [place], Hillery Aldo Other Clinician: Referring Provider: Treating Provider/Extender: Johnella Moloney RD, [place] RREN Weeks in Treatment: 0 Constitutional Sitting or standing Blood Pressure is within target range for patient.. Pulse regular and within target range for patient.Marland Kitchen Respirations regular, non-labored and within target range.. Temperature is normal and within the target range for the patient.Marland Kitchen Appears in no  distress. Respiratory work of breathing is normal. Gastrointestinal (GI)  Mildly obese but nontender. Abdominal wound as described below. Some abdominal striations. Psychiatric Sense of frustration but otherwise calm and cooperative.. Notes Wound exam; hearing questions on her mid abdomen. Current measurements that 6.5 x 2.5 x 5.8 with 10.3 cm of maximal undermining from roughly 9-3 o'clock. The base of the wound appears to be stable. There is not a lot of healthy granulation tissue however certainly no evidence of infection I saw no bleeding or infectious looking drainage palpation around the wound was nontender no crepitus Electronic Signature(s) Signed: 10/16/2019 4:33:08 PM By: Linton Ham MD Entered By: Linton Ham on 10/16/2019 16:21:48 -------------------------------------------------------------------------------- Physician Orders Details Patient Name: Date of Service: Melissa Spray, MA [name] 10/16/2019 2:45 PM Medical Record Number: [id number] Patient Account Number: [id number] Date of Birth/Sex: Treating RN: 11/12/1975 (44 y.o. [name] Primary Care Provider: [place], Hillery Aldo Other Clinician: Referring Provider: Treating Provider/Extender: Johnella Moloney RD, [place] RREN Weeks in Treatment: 0 Verbal / Phone Orders: No Diagnosis Coding Follow-up Appointments Return Appointment in 1 week. Dressing Change Frequency Wound #1 Abdomen - midline Change dressing three times week. - by home health Skin Barriers/Peri-Wound Care Wound #1 Abdomen - midline Skin Prep Wound Cleansing Wound #1 Abdomen - midline Clean wound with Wound Cleanser - or normal saline Negative Presssure Wound Therapy Wound #1 Abdomen - midline Wound Vac to wound continuously at 136mm/hg pressure Black and White Foam combination - white foam in undermining/tunneling areas [place] skilled nursing for wound care. - [facility] Electronic Signature(s) Signed: 10/16/2019  4:21:43 PM By: Levan Hurst RN, BSN Signed: 10/16/2019 4:33:08 PM By: Linton Ham MD Entered By: Levan Hurst on 10/16/2019 15:53:39 -------------------------------------------------------------------------------- Problem List Details Patient Name: Date of Service: Melissa Spray, MA [name] 10/16/2019 2:45 PM Medical Record Number: [id number] Patient Account Number: [id number] Date of Birth/Sex: Treating RN: [birth year] (44 y.o. [name] Primary Care Provider: [place], Hillery Aldo Other Clinician: Referring Provider: Treating Provider/Extender: Johnella Moloney RD, [place] RREN Weeks in Treatment: 0 Active Problems ICD-10 Encounter Code Description Active Date MDM Diagnosis T81.31XA Disruption of external operation (surgical) wound, not elsewhere classified, 10/16/2019 No Yes initial encounter K91.873 Postprocedural seroma of a digestive system organ or structure following 10/16/2019 No Yes other procedure S31.105S Unspecified open wound of abdominal wall, periumbilic region without 2/62/0355 No Yes penetration into peritoneal cavity, sequela Inactive Problems Resolved Problems Electronic Signature(s) Signed: 10/16/2019 4:33:08 PM By: Linton Ham MD Entered By: Linton Ham on 10/16/2019 16:07:01 -------------------------------------------------------------------------------- Progress Note Details Patient Name: Date of Service: Melissa Spray, MA [name] 10/16/2019 2:45 PM Medical Record Number: [id number] Patient Account Number: [id number] Date of Birth/Sex: Treating RN: 08-01-1975 (44 y.o. [name] Primary Care Provider: Elio Forget RD, Hillery Aldo Other Clinician: Referring Provider: Treating Provider/Extender: Johnella Moloney RD, [place] RREN Weeks in Treatment: 0 Subjective Chief Complaint Information obtained from Patient 10/16/2019; patient is here for review of a nonhealing abdominal wound History of Present Illness (HPI) ADMISSION 10/16/2019 This is a  44 year old woman who apparently had a perforated diverticulum in the mid 2000's. She required a colostomy and then reversal. She did well up until 2009 she required an incisional hernia repair at an outside hospital. She had a gastric sleeve procedure as well in 2015. Sometime after this she developed an abdominal seroma. She had abdominal drains as initial management. She had drainage of the seroma cavity in 2016 in the OR. This resulted in resolution of the seroma cavity for a  long period of time. She represented in December of last year year with a large amount of fluid. Her last CT scan was in April noted that she had a small residual fluid collection. However because of continued drainage it was elected that she would go ahead with a surgical procedure for debridement of the seroma cavity. She was taken to the OR on Jun 29, 2019. The measurements of the area were 15 x 15 x 3 at that point. A wound VAC was placed postoperatively. She tells me she loses a lot of blood in the wound VAC and she is iron deficient. Last hemoglobin I see was 11.9 on 06/27/2019. Her albumin was normal. The patient comes in the clinic here very frustrated with the lack of progress. She says the wound is not healed at all with the wound VAC. She continues to have what she describes as a large amount of sanguinous drainage and she changes the canister every day. She is not having any pain or fever. She is wondering what her options are given the fact that she believes the wound VAC to be ineffective. Past medical history includes bipolar disorder, sarcoidosis, TIA, perforated diverticulum in the mid 2000's, incisional hernia repair in 2009, gastric sleeve procedure in 2015, hypertension Patient History Information obtained from Patient. Allergies acetaminophen (Severity: Mild, Reaction: nausea) Family History Cancer - Maternal Grandparents, Diabetes - Maternal Grandparents, Heart Disease - Paternal Grandparents,  Hypertension - Maternal Grandparents, Lung Disease - Maternal Grandparents, Thyroid Problems - Mother,Maternal Grandparents, No family history of Hereditary Spherocytosis, Kidney Disease, Seizures, Stroke, Tuberculosis. Social History Current every day smoker - vapes 3 times a day, Marital Status - Divorced, Alcohol Use - Never, Drug Use - No History, Caffeine Use - Daily - tea, soda. Medical History Hematologic/Lymphatic Patient has history of Anemia - iron deficient Cardiovascular Patient has history of Hypertension Musculoskeletal Patient has history of Osteoarthritis Psychiatric Patient has history of Confinement Anxiety Medical A Surgical History Notes nd Respiratory Sarcoidosis Gastrointestinal abdominal seroma Psychiatric Bipolar, Anxiety Review of Systems (ROS) Constitutional Symptoms (General Health) Denies complaints or symptoms of Fatigue, Fever, Chills, Marked Weight Change. Eyes Denies complaints or symptoms of Dry Eyes, Vision Changes, Glasses / Contacts. Ear/Nose/Mouth/Throat Denies complaints or symptoms of Chronic sinus problems or rhinitis. Gastrointestinal Denies complaints or symptoms of Frequent diarrhea, Nausea, Vomiting. Endocrine Denies complaints or symptoms of Heat/cold intolerance. Genitourinary Denies complaints or symptoms of Frequent urination. Integumentary (Skin) Complains or has symptoms of Wounds - abdominal wound. Neurologic Denies complaints or symptoms of Numbness/parasthesias. Objective Constitutional Sitting or standing Blood Pressure is within target range for patient.. Pulse regular and within target range for patient.Marland Kitchen Respirations regular, non-labored and within target range.. Temperature is normal and within the target range for the patient.Marland Kitchen Appears in no distress. Vitals Time Taken: 3:14 PM, Height: 66 in, Source: Stated, Weight: 235 lbs, Source: Stated, BMI: 37.9, Temperature: 98.3 F, Pulse: 111 bpm, Respiratory Rate: 18  breaths/min, Blood Pressure: 130/74 mmHg. Respiratory work of breathing is normal. Gastrointestinal (GI) Mildly obese but nontender. Abdominal wound as described below. Some abdominal striations. Psychiatric Sense of frustration but otherwise calm and cooperative.. General Notes: Wound exam; hearing questions on her mid abdomen. Current measurements that 6.5 x 2.5 x 5.8 with 10.3 cm of maximal undermining from roughly 9-3 o'clock. The base of the wound appears to be stable. There is not a lot of healthy granulation tissue however certainly no evidence of infection I saw no bleeding or infectious looking drainage palpation around the  wound was nontender no crepitus Integumentary (Hair, Skin) Wound #1 status is Open. Original cause of wound was Surgical Injury. The wound is located on the Abdomen - midline. The wound measures 6.5cm length x 2.8cm width x 5.8cm depth; 14.294cm^2 area and 82.907cm^3 volume. There is Fat Layer (Subcutaneous Tissue) exposed. There is no tunneling noted, however, there is undermining starting at 7:00 and ending at 4:00 with a maximum distance of 10.3cm. There is a large amount of serosanguineous drainage noted. The wound margin is well defined and not attached to the wound base. There is large (67-100%) red granulation within the wound bed. There is no necrotic tissue within the wound bed. Assessment Active Problems ICD-10 Disruption of external operation (surgical) wound, not elsewhere classified, initial encounter Postprocedural seroma of a digestive system organ or structure following other procedure Unspecified open wound of abdominal wall, periumbilic region without penetration into peritoneal cavity, sequela Plan Follow-up Appointments: Return Appointment in 1 week. Dressing Change Frequency: Wound #1 Abdomen - midline: Change dressing three times week. - by home health Skin Barriers/Peri-Wound Care: Wound #1 Abdomen - midline: Skin Prep Wound  Cleansing: Wound #1 Abdomen - midline: Clean wound with Wound Cleanser - or normal saline Negative Presssure Wound Therapy: Wound #1 Abdomen - midline: Wound Vac to wound continuously at 160mm/hg pressure Black and White Foam combination - white foam in undermining/tunneling areas Home Health: [facility] skilled nursing for wound care. - [facility] 1. I think the patient was anxious for another option about how to dress this wound although truthfully I think the wound VAC is the best option even at this point. I am not convinced that there is been no improvement. 2. I attempted to explain this to her in some detail that I would not change the dressing currently a wound VAC until I was certain that there was not any ongoing improvement. I explained that a large abdominal wound like she has would take a very long time to heal in any case. But the wound VAC was the best way to go at that. The option would be some form of collagen-based dressing with backing wet-to-dry however I would only consider this if I was absolutely convinced that there was no benefit of the wound VAC. 3. She asked me about another attempt at surgical closure. I told her I am not a surgeon however she has been going to see a Psychologist, sport and exercise and I am confident if there was an option for a surgical closure at this point the not would have been offered to her. 4. She told me she was anemic and that somebody had recommended parenteral iron however I did not see any evidence of that in the chart on [place] [facility] link last hemoglobin I saw was quite normal at 11.9 with normal indices although this was prior to her surgery. At that point her albumin was 3.8 5. Notable that she has had fluctuating slightly low potassium levels although I am not sure what the issue is here. Electronic Signature(s) Signed: 10/16/2019 4:33:08 PM By: Linton Ham MD Entered By: Linton Ham on 10/16/2019  16:25:59 -------------------------------------------------------------------------------- HxROS Details Patient Name: Date of Service: Melissa Spray, MA [name] 10/16/2019 2:45 PM Medical Record Number: [id number] Patient Account Number: [id number] Date of Birth/Sex: Treating RN: 06-09-1975 (44 y.o. [name] Primary Care Provider: Elio Forget RD, Hillery Aldo Other Clinician: Referring Provider: Treating Provider/Extender: Johnella Moloney RD, [place] RREN Weeks in Treatment: 0 Information Obtained From Patient Constitutional Symptoms ([place])  Complaints and Symptoms: Negative for: Fatigue; Fever; Chills; Marked Weight Change Eyes Complaints and Symptoms: Negative for: Dry Eyes; Vision Changes; Glasses / Contacts Ear/Nose/Mouth/Throat Complaints and Symptoms: Negative for: Chronic sinus problems or rhinitis Gastrointestinal Complaints and Symptoms: Negative for: Frequent diarrhea; Nausea; Vomiting Medical History: Past Medical History Notes: abdominal seroma Endocrine Complaints and Symptoms: Negative for: Heat/cold intolerance Genitourinary Complaints and Symptoms: Negative for: Frequent urination Integumentary (Skin) Complaints and Symptoms: Positive for: Wounds - abdominal wound Neurologic Complaints and Symptoms: Negative for: Numbness/parasthesias Hematologic/Lymphatic Medical History: Positive for: Anemia - iron deficient Respiratory Medical History: Past Medical History Notes: Sarcoidosis Cardiovascular Medical History: Positive for: Hypertension Immunological Musculoskeletal Medical History: Positive for: Osteoarthritis Oncologic Psychiatric Medical History: Positive for: Confinement Anxiety Past Medical History Notes: Bipolar, Anxiety Immunizations Pneumococcal Vaccine: Received Pneumococcal Vaccination: No Implantable Devices None Family and Social History Cancer: Yes - Maternal Grandparents; Diabetes: Yes - Maternal Grandparents; Heart  Disease: Yes - Paternal Grandparents; Hereditary Spherocytosis: No; Hypertension: Yes - Maternal Grandparents; Kidney Disease: No; Lung Disease: Yes - Maternal Grandparents; Seizures: No; Stroke: No; Thyroid Problems: Yes - Mother,Maternal Grandparents; Tuberculosis: No; Current every day smoker - vapes 3 times a day; Marital Status - Divorced; Alcohol Use: Never; Drug Use: No History; Caffeine Use: Daily - tea, soda; Financial Concerns: No; Food, Clothing or Shelter Needs: No; Support System Lacking: No; Transportation Concerns: No Engineer, maintenance) Signed: 10/16/2019 4:21:43 PM By: Levan Hurst RN, BSN Signed: 10/16/2019 4:33:08 PM By: Linton Ham MD Entered By: Levan Hurst on 10/16/2019 15:26:08 -------------------------------------------------------------------------------- SuperBill Details Patient Name: Date of Service: Melissa Spray, [place] [name] 10/16/2019 Medical Record Number: [id number] Patient Account Number: [id number] Date of Birth/Sex: Treating RN: 1975/11/01 (44 y.o. [name] Primary Care Provider: [place], [place] Other Clinician: Referring Provider: Treating Provider/Extender: Johnella Moloney RD, [place] RREN Weeks in Treatment: 0 Diagnosis Coding ICD-10 Codes Code Description T81.31XA Disruption of external operation (surgical) wound, not elsewhere classified, initial encounter K91.873 Postprocedural seroma of a digestive system organ or structure following other procedure S31.105S Unspecified open wound of abdominal wall, periumbilic region without penetration into peritoneal cavity, sequela Facility Procedures CPT4 Code: [id number] Description: [place] VISIT-LEV 3 EST PT Modifier: 25 Quantity: 1 CPT4 Code: [id number] Description: 91638 - WOUND VAC-50 SQ CM OR LESS Modifier: Quantity: 1 Physician Procedures : CPT4 Code Description Modifier [id number] WC PHYS LEVEL 3 NEW PT ICD-10 Diagnosis Description T81.31XA Disruption of external  operation (surgical) wound, not elsewhere classified, initial encounter K91.873 Postprocedural seroma of a digestive system organ  or structure following other procedure S31.105S Unspecified open wound of abdominal wall, periumbilic region without penetration into peritoneal cavit Quantity: 1 y, sequela Electronic Signature(s) Signed: 10/16/2019 4:33:08 PM By: Linton Ham MD Previous Signature: 10/16/2019 4:21:43 PM Version By: Levan Hurst RN, BSN Entered By: Linton Ham on 10/16/2019 16:26:32

## 2019-10-17 NOTE — Progress Notes (Signed)
YASIRA, ENGELSON ([id number]) Visit Report for 10/16/2019 Abuse/Suicide Risk Screen Details Patient Name: Date of Service: [name], [place] [name] 10/16/2019 2:45 PM Medical Record Number: [id number] Patient Account Number: [id number] Date of Birth/Sex: Treating RN: 1975-10-11 (44 y.o. Nancy Fetter Primary Care Farryn Linares: [place], Hillery Aldo Other Clinician: Referring Tabitha Riggins: Treating Ottis Sarnowski/Extender: Johnella Moloney RD, [place] RREN Weeks in Treatment: 0 Abuse/Suicide Risk Screen Items Answer ABUSE RISK SCREEN: Has anyone close to you tried to hurt or harm you recentlyo No Do you feel uncomfortable with anyone in your familyo No Has anyone forced you do things that you didnt want to doo No Electronic Signature(s) Signed: 10/16/2019 4:21:43 PM By: Levan Hurst RN, BSN Entered By: Levan Hurst on 10/16/2019 15:26:18 -------------------------------------------------------------------------------- Activities of Daily Living Details Patient Name: Date of Service: Melissa Spray, [place] [name] 10/16/2019 2:45 PM Medical Record Number: [id number] Patient Account Number: [id number] Date of Birth/Sex: Treating RN: 08/23/1975 (44 y.o. Nancy Fetter Primary Care Abdirizak Richison: [place], Hillery Aldo Other Clinician: Referring Petrina Melby: Treating Jessia Kief/Extender: Johnella Moloney RD, [place] RREN Weeks in Treatment: 0 Activities of Daily Living Items Answer Activities of Daily Living (Please select one for each item) Drive Automobile Completely Able T Medications ake Completely Able Use T elephone Completely Able Care for Appearance Completely Able Use T oilet Completely Able Bath / Shower Completely Able Dress Self Completely Able Feed Self Completely Able Walk Completely Able Get In / Out Bed Completely Able Housework Completely Able Prepare Meals Completely [place] for Self Completely Able Electronic Signature(s) Signed: 10/16/2019 4:21:43 PM By: Levan Hurst RN, BSN Entered By: Levan Hurst on 10/16/2019 15:26:49 -------------------------------------------------------------------------------- Education Screening Details Patient Name: Date of Service: Melissa Spray, MA [name] 10/16/2019 2:45 PM Medical Record Number: [id number] Patient Account Number: [id number] Date of Birth/Sex: Treating RN: 11/20/1975 (44 y.o. Nancy Fetter Primary Care Karrigan Messamore: Elio Forget RD, Hillery Aldo Other Clinician: Referring Kalyn Hofstra: Treating Makynna Manocchio/Extender: Johnella Moloney RD, [place] RREN Weeks in Treatment: 0 Primary Learner Assessed: Patient Learning Preferences/Education Level/Primary Language Learning Preference: Explanation, Demonstration, Printed Material Highest Education Level: College or Above Preferred Language: English Cognitive Barrier Language Barrier: No Translator Needed: No Memory Deficit: No Emotional Barrier: No Cultural/Religious Beliefs Affecting Medical Care: No Physical Barrier Impaired Vision: No Impaired Hearing: No Decreased Hand dexterity: No Knowledge/Comprehension Knowledge Level: High Comprehension Level: High Ability to understand written instructions: High Ability to understand verbal instructions: High Motivation Anxiety Level: Calm Cooperation: Cooperative Education Importance: Acknowledges Need Interest in Health Problems: Asks Questions Perception: Coherent Willingness to Engage in Self-Management High Activities: Readiness to Engage in Self-Management High Activities: Electronic Signature(s) Signed: 10/16/2019 4:21:43 PM By: Levan Hurst RN, BSN Entered By: Levan Hurst on 10/16/2019 15:27:11 -------------------------------------------------------------------------------- Fall Risk Assessment Details Patient Name: Date of Service: Melissa Spray, MA [name] 10/16/2019 2:45 PM Medical Record Number: [id number] Patient Account Number: [id number] Date of Birth/Sex: Treating RN: 30-Jun-1975 (44 y.o. Nancy Fetter Primary Care Sherin Murdoch: Elio Forget RD, [place] RREN Other Clinician: Referring Harith Mccadden: Treating Frenchie Dangerfield/Extender: Johnella Moloney RD, [place] RREN Weeks in Treatment: 0 Fall Risk Assessment Items Have you had 2 or more falls in the last 12 monthso 0 No Have you had any fall that resulted in injury in the last 12 monthso 0 No FALLS RISK SCREEN History of falling - immediate or within 3 months 0 No Secondary diagnosis (Do you have 2 or more medical diagnoseso) 15 Yes Ambulatory aid None/bed rest/wheelchair/nurse  0 Yes Crutches/cane/walker 0 No Furniture 0 No Intravenous therapy Access/Saline/Heparin Lock 0 No Gait/Transferring Normal/ bed rest/ wheelchair 0 Yes Weak (short steps with or without shuffle, stooped but able to lift head while walking, may seek 0 No support from furniture) Impaired (short steps with shuffle, may have difficulty arising from chair, head down, impaired 0 No balance) Mental Status Oriented to own ability 0 Yes Electronic Signature(s) Signed: 10/16/2019 4:21:43 PM By: Levan Hurst RN, BSN Entered By: Levan Hurst on 10/16/2019 15:27:40 -------------------------------------------------------------------------------- Nutrition Risk Screening Details Patient Name: Date of Service: Melissa Spray, MA [name] 10/16/2019 2:45 PM Medical Record Number: [id number] Patient Account Number: [id number] Date of Birth/Sex: Treating RN: April 21, 1975 (44 y.o. Nancy Fetter Primary Care Kurk Corniel: Elio Forget RD, [place] RREN Other Clinician: Referring Anirudh Baiz: Treating Sadat Sliwa/Extender: Johnella Moloney RD, [place] RREN Weeks in Treatment: 0 Height (in): 66 Weight (lbs): 235 Body Mass Index (BMI): 37.9 Nutrition Risk Screening Items Score Screening NUTRITION RISK SCREEN: I have an illness or condition that made me change the kind and/or amount of food I eat 0 No I eat fewer than two meals per day 0 No I eat few fruits and vegetables, or milk products 0 No I have  three or more drinks of beer, liquor or wine almost every day 0 No I have tooth or mouth problems that make it hard for me to eat 0 No I don't always have enough money to buy the food I need 0 No I eat alone most of the time 0 No I take three or more different prescribed or over-the-counter drugs a day 1 Yes Without wanting to, I have lost or gained 10 pounds in the last six months 2 Yes I am not always physically able to shop, cook and/or feed myself 0 No Nutrition Protocols Good Risk Protocol Moderate Risk Protocol 0 Provide education on nutrition High Risk Proctocol Risk Level: Moderate Risk Score: 3 Electronic Signature(s) Signed: 10/16/2019 4:21:43 PM By: Levan Hurst RN, BSN Entered By: Levan Hurst on 10/16/2019 15:28:08

## 2019-10-17 NOTE — Telephone Encounter (Signed)
Received a new hem referral from [name] at [facility] for anemia. [name] cld to schedule an appt on 9/16 at [facility] to arrive 30 minutes early.

## 2019-10-17 NOTE — Progress Notes (Signed)
Melissa, [name] ([id number]) Visit Report for 10/16/2019 Allergy List Details Patient Name: Date of Service: [name], [place] [name] 10/16/2019 2:45 PM Medical Record Number: [id number] Patient Account Number: [id number] Date of Birth/Sex: Treating RN: 29-Oct-1975 (44 y.o. [name] Primary Care Merlie Noga: Elio Forget RD, Hillery Aldo Other Clinician: Referring Salmaan Patchin: Treating Ousman Dise/Extender: Johnella Moloney RD, [place] RREN Weeks in Treatment: 0 Allergies Active Allergies acetaminophen Reaction: nausea Severity: Mild Allergy Notes Electronic Signature(s) Signed: 10/16/2019 4:21:43 PM By: Levan Hurst RN, BSN Entered By: Levan Hurst on 10/16/2019 15:16:58 -------------------------------------------------------------------------------- Arrival Information Details Patient Name: Date of Service: Melissa Spray, MA [name] 10/16/2019 2:45 PM Medical Record Number: [id number] Patient Account Number: [id number] Date of Birth/Sex: Treating RN: 02/03/1976 (44 y.o. [name] Primary Care Fisher Hargadon: [place], Hillery Aldo Other Clinician: Referring Shaney Deckman: Treating Arshiya Jakes/Extender: Johnella Moloney RD, [place] RREN Weeks in Treatment: 0 Visit Information Patient Arrived: Ambulatory Arrival Time: 15:13 Accompanied By: alone Transfer Assistance: None Patient Identification Verified: Yes Secondary Verification Process Completed: Yes Patient Requires Transmission-Based Precautions: No Patient Has Alerts: No Electronic Signature(s) Signed: 10/16/2019 4:21:43 PM By: Levan Hurst RN, BSN Entered By: Levan Hurst on 10/16/2019 15:13:32 -------------------------------------------------------------------------------- Clinic Level of Care Assessment Details Patient Name: Date of Service: Melissa Spray, MA [name] 10/16/2019 2:45 PM Medical Record Number: [id number] Patient Account Number: [id number] Date of Birth/Sex: Treating RN: [birth year] (44 y.o. [name] Primary Care Jozlynn Plaia:  [place], Hillery Aldo Other Clinician: Referring Lorre Opdahl: Treating Andris Brothers/Extender: Johnella Moloney RD, [place] RREN Weeks in Treatment: 0 Clinic Level of Care Assessment Items TOOL 1 Quantity Score X- 1 0 Use when EandM and Procedure is performed on INITIAL visit ASSESSMENTS - Nursing Assessment / Reassessment X- 1 20 General Physical Exam (combine w/ comprehensive assessment (listed just below) when performed on new pt. evals) X- 1 25 Comprehensive Assessment (HX, ROS, Risk Assessments, Wounds Hx, etc.) ASSESSMENTS - Wound and Skin Assessment / Reassessment []  - 0 Dermatologic / Skin Assessment (not related to wound area) ASSESSMENTS - Ostomy and/or Continence Assessment and Care []  - 0 Incontinence Assessment and Management []  - 0 Ostomy Care Assessment and Management (repouching, etc.) PROCESS - Coordination of Care X - Simple Patient / Family Education for ongoing care 1 15 []  - 0 Complex (extensive) Patient / Family Education for ongoing care X- 1 10 Staff obtains Programmer, systems, Records, T Results / Process Orders est X- 1 10 Staff telephones HHA, Nursing Homes / Clarify orders / etc []  - 0 Routine Transfer to another Facility (non-emergent condition) []  - 0 Routine Hospital Admission (non-emergent condition) X- 1 15 New Admissions / Biomedical engineer / Ordering NPWT Apligraf, etc. , []  - 0 Emergency Hospital Admission (emergent condition) PROCESS - Special Needs []  - 0 Pediatric / Minor Patient Management []  - 0 Isolation Patient Management []  - 0 Hearing / Language / Visual special needs []  - 0 Assessment of Community assistance (transportation, D/C planning, etc.) []  - 0 Additional assistance / Altered mentation []  - 0 Support Surface(s) Assessment (bed, cushion, seat, etc.) INTERVENTIONS - Miscellaneous []  - 0 External ear exam []  - 0 Patient Transfer (multiple staff / Civil Service fast streamer / Similar devices) []  - 0 Simple Staple / Suture removal (25 or  less) []  - 0 Complex Staple / Suture removal (26 or more) []  - 0 Hypo/Hyperglycemic Management (do not check if billed separately) []  - 0 Ankle / Brachial Index (ABI) - do not check if billed separately Has the patient  been seen at the hospital within the last three years: Yes Total Score: 95 Level Of Care: New/Established - Level 3 Electronic Signature(s) Signed: 10/16/2019 4:21:43 PM By: Levan Hurst RN, BSN Entered By: Levan Hurst on 10/16/2019 15:48:26 -------------------------------------------------------------------------------- Multi Wound Chart Details Patient Name: Date of Service: Melissa Spray, MA [name] 10/16/2019 2:45 PM Medical Record Number: [id number] Patient Account Number: [id number] Date of Birth/Sex: Treating RN: 1975/08/10 (44 y.o. [name] Primary Care [name]: Elio Forget RD, [place] RREN Other Clinician: Referring Worth Kober: Treating Kingsly Kloepfer/Extender: Johnella Moloney RD, [place] RREN Weeks in Treatment: 0 Vital Signs Height(in): 66 Pulse(bpm): 111 Weight(lbs): 235 Blood Pressure(mmHg): 130/74 Body Mass Index(BMI): 38 Temperature(F): 98.3 Respiratory Rate(breaths/min): 18 Photos: [1:No Photos Abdomen - midline] [N/A:N/A N/A] Wound Location: [1:Surgical Injury] [N/A:N/A] Wounding Event: [1:Open Surgical Wound] [N/A:N/A] Primary Etiology: [1:Anemia, Hypertension, Osteoarthritis, N/A] Comorbid History: [1:Confinement Anxiety 06/29/2019] [N/A:N/A] Date Acquired: [1:0] [N/A:N/A] Weeks of Treatment: [1:Open] [N/A:N/A] Wound Status: [1:6.5x2.8x5.8] [N/A:N/A] Measurements L x W x D (cm) [1:14.294] [N/A:N/A] A (cm) : rea [1:82.907] [N/A:N/A] Volume (cm) : [1:0.00%] [N/A:N/A] % Reduction in A rea: [1:0.00%] [N/A:N/A] % Reduction in Volume: [1:7] Starting Position 1 (o'clock): [1:4] Ending Position 1 (o'clock): [1:10.3] Maximum Distance 1 (cm): [1:Yes] [N/A:N/A] Undermining: [1:Full Thickness Without Exposed] [N/A:N/A] Classification: [1:Support  Structures Large] [N/A:N/A] Exudate Amount: [1:Serosanguineous] [N/A:N/A] Exudate Type: [1:red, brown] [N/A:N/A] Exudate Color: [1:Well defined, not attached] [N/A:N/A] Wound Margin: [1:Large (67-100%)] [N/A:N/A] Granulation Amount: [1:Red] [N/A:N/A] Granulation Quality: [1:None Present (0%)] [N/A:N/A] Necrotic Amount: [1:Fat Layer (Subcutaneous Tissue): Yes N/A] Exposed Structures: [1:Fascia: No Tendon: No Muscle: No Joint: No Bone: No None] [N/A:N/A] Epithelialization: [1:Negative Pressure Wound Therapy] [N/A:N/A] Procedures Performed: [1:Application (NPWT)] Treatment Notes Electronic Signature(s) Signed: 10/16/2019 4:21:43 PM By: Levan Hurst RN, BSN Signed: 10/16/2019 4:33:08 PM By: Linton Ham MD Entered By: Linton Ham on 10/16/2019 16:10:03 -------------------------------------------------------------------------------- Multi-Disciplinary Care Plan Details Patient Name: Date of Service: Melissa Spray, MA [name] 10/16/2019 2:45 PM Medical Record Number: [id number] Patient Account Number: [id number] Date of Birth/Sex: Treating RN: 02-25-1975 (44 y.o. [name] Primary Care Torres Hardenbrook: Elio Forget RD, Hillery Aldo Other Clinician: Referring Sala Tague: Treating Abryanna Musolino/Extender: Johnella Moloney RD, [place] RREN Weeks in Treatment: 0 Active Inactive Nutrition Nursing Diagnoses: Potential for alteratiion in Nutrition/Potential for imbalanced nutrition Goals: Patient/caregiver agrees to and verbalizes understanding of need to use nutritional supplements and/or vitamins as prescribed Date Initiated: 10/16/2019 Target Resolution Date: 11/17/2019 Goal Status: Active Interventions: Assess patient nutrition upon admission and as needed per policy Provide education on nutrition Notes: Wound/Skin Impairment Nursing Diagnoses: Impaired tissue integrity Knowledge deficit related to ulceration/compromised skin integrity Goals: Patient/caregiver will verbalize understanding of skin  care regimen Date Initiated: 10/16/2019 Target Resolution Date: 11/17/2019 Goal Status: Active Ulcer/skin breakdown will have a volume reduction of 30% by week 4 Date Initiated: 10/16/2019 Target Resolution Date: 11/17/2019 Goal Status: Active Interventions: Assess patient/caregiver ability to obtain necessary supplies Assess patient/caregiver ability to perform ulcer/skin care regimen upon admission and as needed Assess ulceration(s) every visit Provide education on ulcer and skin care Notes: Electronic Signature(s) Signed: 10/16/2019 4:21:43 PM By: Levan Hurst RN, BSN Entered By: Levan Hurst on 10/16/2019 15:39:15 -------------------------------------------------------------------------------- Negative Pressure Wound Therapy Application (NPWT) Details Patient Name: Date of Service: [name], [place] [name] 10/16/2019 2:45 PM Medical Record Number: [id number] Patient Account Number: [id number] Date of Birth/Sex: Treating RN: 09-03-1975 (44 y.o. [name] Primary Care Polk Minor: [place], Hillery Aldo Other Clinician: Referring Lakysha Kossman: Treating Malick Netz/Extender: Johnella Moloney RD, [place] RREN [place]  in Treatment: 0 NPWT Application Performed for: Wound #1 Abdomen - midline Performed By: Levan Hurst, RN Type: VAC System Coverage Size (sq cm): 18.2 Pressure Type: Constant Pressure Setting: 125 mmHG Drain Type: None Quantity of Sponges/Gauze Inserted: 1 piece black foam Sponge/Dressing Type: Sponge, Black Date Initiated: 06/29/2019 Response to Treatment: pt tolerated well Post Procedure Diagnosis Same as Pre-procedure Electronic Signature(s) Signed: 10/16/2019 4:21:43 PM By: Levan Hurst RN, BSN Entered By: Levan Hurst on 10/16/2019 15:46:57 -------------------------------------------------------------------------------- Pain Assessment Details Patient Name: Date of Service: Melissa Spray, MA [name] 10/16/2019 2:45 PM Medical Record Number: [id number] Patient Account  Number: [id number] Date of Birth/Sex: Treating RN: 04-20-1975 (44 y.o. [name] Primary Care Peyton Rossner: Elio Forget RD, Hillery Aldo Other Clinician: Referring Stony Stegmann: Treating Motty Borin/Extender: Johnella Moloney RD, [place] RREN Weeks in Treatment: 0 Active Problems Location of Pain Severity and Description of Pain Patient Has Paino No Site Locations Pain Management and Medication Current Pain Management: Electronic Signature(s) Signed: 10/16/2019 4:21:43 PM By: Levan Hurst RN, BSN Entered By: Levan Hurst on 10/16/2019 15:30:47 -------------------------------------------------------------------------------- Patient/Caregiver Education Details Patient Name: Date of Service: Melissa Spray, MA [name] 8/23/2021andnbsp2:45 PM Medical Record Number: [id number] Patient Account Number: [id number] Date of Birth/Gender: Treating RN: 03-22-1975 (44 y.o. [name] Primary Care Physician: [place], [place] Other Clinician: Referring Physician: Treating Physician/Extender: Johnella Moloney RD, [place] RREN Weeks in Treatment: 0 Education Assessment Education Provided To: Patient Education Topics Provided Nutrition: Methods: Explain/Verbal Responses: State content correctly Wound/Skin Impairment: Methods: Explain/Verbal Responses: State content correctly Electronic Signature(s) Signed: 10/16/2019 4:21:43 PM By: Levan Hurst RN, BSN Entered By: Levan Hurst on 10/16/2019 15:39:26 -------------------------------------------------------------------------------- Wound Assessment Details Patient Name: Date of Service: Melissa Spray, MA [name] 10/16/2019 2:45 PM Medical Record Number: [id number] Patient Account Number: [id number] Date of Birth/Sex: Treating RN: Apr 09, 1975 (44 y.o. [name] Primary Care Jamice Carreno: Elio Forget RD, [place] RREN Other Clinician: Referring Ani Deoliveira: Treating Coyle Stordahl/Extender: Johnella Moloney RD, [place] RREN Weeks in Treatment: 0 Wound  Status Wound Number: 1 Primary Etiology: Open Surgical Wound Wound Location: Abdomen - midline Wound Status: Open Wounding Event: Surgical Injury Comorbid History: Anemia, Hypertension, Osteoarthritis, Confinement Anxiety Date Acquired: 06/29/2019 Weeks Of Treatment: 0 Clustered Wound: No Wound Measurements Length: (cm) 6.5 Width: (cm) 2.8 Depth: (cm) 5.8 Area: (cm) 14.294 Volume: (cm) 82.907 % Reduction in Area: 0% % Reduction in Volume: 0% Epithelialization: None Tunneling: No Undermining: Yes Starting Position (o'clock): 7 Ending Position (o'clock): 4 Maximum Distance: (cm) 10.3 Wound Description Classification: Full Thickness Without Exposed Support Structures Wound Margin: Well defined, not attached Exudate Amount: Large Exudate Type: Serosanguineous Exudate Color: red, brown Foul Odor After Cleansing: No Slough/Fibrino No Wound Bed Granulation Amount: Large (67-100%) Exposed Structure Granulation Quality: Red Fascia Exposed: No Necrotic Amount: None Present (0%) Fat Layer (Subcutaneous Tissue) Exposed: Yes Tendon Exposed: No Muscle Exposed: No Joint Exposed: No Bone Exposed: No Electronic Signature(s) Signed: 10/16/2019 4:21:43 PM By: Levan Hurst RN, BSN Entered By: Levan Hurst on 10/16/2019 15:51:05 -------------------------------------------------------------------------------- Vitals Details Patient Name: Date of Service: Melissa Spray, [place] [name] 10/16/2019 2:45 PM Medical Record Number: [id number] Patient Account Number: [id number] Date of Birth/Sex: Treating RN: Dec 07, 1975 (44 y.o. [name] Primary Care Kristiann Noyce: [place], [place] Other Clinician: Referring Izadora Roehr: Treating Celestine Prim/Extender: Johnella Moloney RD, [place] RREN Weeks in Treatment: 0 Vital Signs Time Taken: 15:14 Temperature (F): 98.3 Height (in): 66 Pulse (bpm): 111 Source: Stated Respiratory Rate (breaths/min): 18 Weight (lbs): 235 Blood Pressure (mmHg):  130/74 Source: Stated Reference Range: 80 - 120 mg / dl Body Mass Index (BMI): 37.9 Electronic Signature(s) Signed: 10/16/2019 4:21:43 PM By: Levan Hurst RN, BSN Entered By: Levan Hurst on 10/16/2019 15:16:04

## 2019-10-18 DIAGNOSIS — F209 Schizophrenia, unspecified: Secondary | ICD-10-CM | POA: Diagnosis not present

## 2019-10-18 DIAGNOSIS — D509 Iron deficiency anemia, unspecified: Secondary | ICD-10-CM | POA: Diagnosis not present

## 2019-10-18 DIAGNOSIS — F319 Bipolar disorder, unspecified: Secondary | ICD-10-CM | POA: Diagnosis not present

## 2019-10-18 DIAGNOSIS — F419 Anxiety disorder, unspecified: Secondary | ICD-10-CM | POA: Diagnosis not present

## 2019-10-18 DIAGNOSIS — D869 Sarcoidosis, unspecified: Secondary | ICD-10-CM | POA: Diagnosis not present

## 2019-10-18 DIAGNOSIS — K91872 Postprocedural seroma of a digestive system organ or structure following a digestive system procedure: Secondary | ICD-10-CM | POA: Diagnosis not present

## 2019-10-19 ENCOUNTER — Ambulatory Visit: Payer: Medicare Other | Admitting: Skilled Nursing Facility1

## 2019-10-20 DIAGNOSIS — D509 Iron deficiency anemia, unspecified: Secondary | ICD-10-CM | POA: Diagnosis not present

## 2019-10-20 DIAGNOSIS — D869 Sarcoidosis, unspecified: Secondary | ICD-10-CM | POA: Diagnosis not present

## 2019-10-20 DIAGNOSIS — F319 Bipolar disorder, unspecified: Secondary | ICD-10-CM | POA: Diagnosis not present

## 2019-10-20 DIAGNOSIS — F209 Schizophrenia, unspecified: Secondary | ICD-10-CM | POA: Diagnosis not present

## 2019-10-20 DIAGNOSIS — F419 Anxiety disorder, unspecified: Secondary | ICD-10-CM | POA: Diagnosis not present

## 2019-10-20 DIAGNOSIS — K91872 Postprocedural seroma of a digestive system organ or structure following a digestive system procedure: Secondary | ICD-10-CM | POA: Diagnosis not present

## 2019-10-23 ENCOUNTER — Other Ambulatory Visit: Payer: Self-pay

## 2019-10-23 ENCOUNTER — Encounter (HOSPITAL_BASED_OUTPATIENT_CLINIC_OR_DEPARTMENT_OTHER): Payer: Medicare Other | Admitting: Internal Medicine

## 2019-10-23 DIAGNOSIS — F419 Anxiety disorder, unspecified: Secondary | ICD-10-CM | POA: Diagnosis not present

## 2019-10-23 DIAGNOSIS — D869 Sarcoidosis, unspecified: Secondary | ICD-10-CM | POA: Diagnosis not present

## 2019-10-23 DIAGNOSIS — F319 Bipolar disorder, unspecified: Secondary | ICD-10-CM | POA: Diagnosis not present

## 2019-10-23 DIAGNOSIS — S31105A Unspecified open wound of abdominal wall, periumbilic region without penetration into peritoneal cavity, initial encounter: Secondary | ICD-10-CM | POA: Diagnosis not present

## 2019-10-23 DIAGNOSIS — K91872 Postprocedural seroma of a digestive system organ or structure following a digestive system procedure: Secondary | ICD-10-CM | POA: Diagnosis not present

## 2019-10-23 DIAGNOSIS — I10 Essential (primary) hypertension: Secondary | ICD-10-CM | POA: Diagnosis not present

## 2019-10-23 DIAGNOSIS — F209 Schizophrenia, unspecified: Secondary | ICD-10-CM | POA: Diagnosis not present

## 2019-10-23 DIAGNOSIS — D509 Iron deficiency anemia, unspecified: Secondary | ICD-10-CM | POA: Diagnosis not present

## 2019-10-23 DIAGNOSIS — T8131XA Disruption of external operation (surgical) wound, not elsewhere classified, initial encounter: Secondary | ICD-10-CM | POA: Diagnosis not present

## 2019-10-23 NOTE — Progress Notes (Signed)
Melissa, [name] ([id number]) Visit Report for 10/23/2019 Arrival Information Details Patient Name: Date of Service: [name], [place] [name] 10/23/2019 12:45 PM Medical Record Number: [id number] Patient Account Number: [id number] Date of Birth/Sex: Treating RN: [birth year] (44 y.o. Martyn Malay, Linda Primary Care Nikolaj Geraghty: Elio Forget RD, Hillery Aldo Other Clinician: Referring Jianni Shelden: Treating Jhordan Kinter/Extender: Johnella Moloney RD, [place] RREN Weeks in Treatment: 1 Visit Information History Since Last Visit Added or deleted any medications: Yes Patient Arrived: Ambulatory Any new allergies or adverse reactions: No Arrival Time: 12:51 Had a fall or experienced change in No Accompanied By: self activities of daily living that may affect Transfer Assistance: None risk of falls: Patient Identification Verified: Yes Signs or symptoms of abuse/neglect since last visito No Secondary Verification Process Completed: Yes Hospitalized since last visit: No Patient Requires Transmission-Based Precautions: No Implantable device outside of the clinic excluding No Patient Has Alerts: No cellular tissue based products placed in the center since last visit: Has Dressing in Place as Prescribed: Yes Pain Present Now: No Electronic Signature(s) Signed: 10/23/2019 4:32:31 PM By: Baruch Gouty RN, BSN Entered By: Baruch Gouty on 10/23/2019 12:52:35 -------------------------------------------------------------------------------- Clinic Level of Care Assessment Details Patient Name: Date of Service: Melissa Spray, MA [name] 10/23/2019 12:45 PM Medical Record Number: [id number] Patient Account Number: [id number] Date of Birth/Sex: Treating RN: 1975-10-22 (44 y.o. [name] Primary Care Lonetta Blassingame: Elio Forget RD, [place] RREN Other Clinician: Referring Elverta Dimiceli: Treating Matalyn Nawaz/Extender: Johnella Moloney RD, [place] RREN Weeks in Treatment: 1 Clinic Level of Care Assessment Items TOOL 4 Quantity Score X- 1  0 Use when only an EandM is performed on FOLLOW-UP visit ASSESSMENTS - Nursing Assessment / Reassessment X- 1 10 Reassessment of Co-morbidities (includes updates in patient status) []  - 0 Reassessment of Adherence to Treatment Plan ASSESSMENTS - Wound and Skin A ssessment / Reassessment X - Simple Wound Assessment / Reassessment - one wound 1 5 []  - 0 Complex Wound Assessment / Reassessment - multiple wounds []  - 0 Dermatologic / Skin Assessment (not related to wound area) ASSESSMENTS - Focused Assessment []  - 0 Circumferential Edema Measurements - multi extremities []  - 0 Nutritional Assessment / Counseling / Intervention []  - 0 Lower Extremity Assessment (monofilament, tuning fork, pulses) []  - 0 Peripheral Arterial Disease Assessment (using hand held doppler) ASSESSMENTS - Ostomy and/or Continence Assessment and Care []  - 0 Incontinence Assessment and Management []  - 0 Ostomy Care Assessment and Management (repouching, etc.) PROCESS - Coordination of Care X - Simple Patient / Family Education for ongoing care 1 15 []  - 0 Complex (extensive) Patient / Family Education for ongoing care X- 1 10 Staff obtains Programmer, systems, Records, T Results / Process Orders est X- 1 10 Staff telephones HHA, Nursing Homes / Clarify orders / etc []  - 0 Routine Transfer to another Facility (non-emergent condition) []  - 0 Routine Hospital Admission (non-emergent condition) []  - 0 New Admissions / Biomedical engineer / Ordering NPWT Apligraf, etc. , []  - 0 Emergency Hospital Admission (emergent condition) X- 1 10 Simple Discharge Coordination []  - 0 Complex (extensive) Discharge Coordination PROCESS - Special Needs []  - 0 Pediatric / Minor Patient Management []  - 0 Isolation Patient Management []  - 0 Hearing / Language / Visual special needs []  - 0 Assessment of Community assistance (transportation, D/C planning, etc.) []  - 0 Additional assistance / Altered mentation []  -  0 Support Surface(s) Assessment (bed, cushion, seat, etc.) INTERVENTIONS - Wound Cleansing / Measurement X - Simple Wound Cleansing - one  wound 1 5 []  - 0 Complex Wound Cleansing - multiple wounds X- 1 5 Wound Imaging (photographs - any number of wounds) []  - 0 Wound Tracing (instead of photographs) X- 1 5 Simple Wound Measurement - one wound []  - 0 Complex Wound Measurement - multiple wounds INTERVENTIONS - Wound Dressings []  - 0 Small Wound Dressing one or multiple wounds X- 1 15 Medium Wound Dressing one or multiple wounds []  - 0 Large Wound Dressing one or multiple wounds []  - 0 Application of Medications - topical []  - 0 Application of Medications - injection INTERVENTIONS - Miscellaneous []  - 0 External ear exam []  - 0 Specimen Collection (cultures, biopsies, blood, body fluids, etc.) []  - 0 Specimen(s) / Culture(s) sent or taken to Lab for analysis []  - 0 Patient Transfer (multiple staff / Civil Service fast streamer / Similar devices) []  - 0 Simple Staple / Suture removal (25 or less) []  - 0 Complex Staple / Suture removal (26 or more) []  - 0 Hypo / Hyperglycemic Management (close monitor of Blood Glucose) []  - 0 Ankle / Brachial Index (ABI) - do not check if billed separately X- 1 5 Vital Signs Has the patient been seen at the hospital within the last three years: Yes Total Score: 95 Level Of Care: New/Established - Level 3 Electronic Signature(s) Signed: 10/23/2019 4:24:12 PM By: Levan Hurst RN, BSN Entered By: Levan Hurst on 10/23/2019 13:40:13 -------------------------------------------------------------------------------- Encounter Discharge Information Details Patient Name: Date of Service: Melissa Spray, MA [name] 10/23/2019 12:45 PM Medical Record Number: [id number] Patient Account Number: [id number] Date of Birth/Sex: Treating RN: 03-15-1975 (44 y.o. [name] Melissa Primary Care Lennon Boutwell: [place], Hillery Aldo Other Clinician: Referring Lige Lakeman: Treating  Broadus Costilla/Extender: Johnella Moloney RD, [place] RREN Weeks in Treatment: 1 Encounter Discharge Information Items Discharge Condition: Stable Ambulatory Status: Ambulatory Discharge Destination: Home Transportation: Private Auto Accompanied By: self Schedule Follow-up Appointment: Yes Clinical Summary of Care: Patient Declined Electronic Signature(s) Signed: 10/23/2019 4:26:23 PM By: Kela Millin Entered By: Kela Millin on 10/23/2019 15:44:56 -------------------------------------------------------------------------------- Lower Extremity Assessment Details Patient Name: Date of Service: Melissa Spray, MA [name] 10/23/2019 12:45 PM Medical Record Number: [id number] Patient Account Number: [id number] Date of Birth/Sex: Treating RN: May 18, 1975 (44 y.o. [name] Primary Care Akari Defelice: [place], Hillery Aldo Other Clinician: Referring Zaniya Mcaulay: Treating Treacy Holcomb/Extender: Johnella Moloney RD, [place] RREN Weeks in Treatment: 1 Electronic Signature(s) Signed: 10/23/2019 4:32:31 PM By: Baruch Gouty RN, BSN Entered By: Baruch Gouty on 10/23/2019 12:57:45 -------------------------------------------------------------------------------- Multi Wound Chart Details Patient Name: Date of Service: Melissa Spray, MA [name] 10/23/2019 12:45 PM Medical Record Number: [id number] Patient Account Number: [id number] Date of Birth/Sex: Treating RN: 09/09/1975 (44 y.o. [name] Primary Care Jalil Lorusso: Elio Forget RD, [place] RREN Other Clinician: Referring Ziaire Bieser: Treating Marki Frede/Extender: Johnella Moloney RD, [place] RREN Weeks in Treatment: 1 Vital Signs Height(in): 66 Pulse(bpm): 98 Weight(lbs): 235 Blood Pressure(mmHg): 121/80 Body Mass Index(BMI): 38 Temperature(F): 98.7 Respiratory Rate(breaths/min): 18 Photos: [1:No Photos Abdomen - midline] [N/A:N/A N/A] Wound Location: [1:Surgical Injury] [N/A:N/A] Wounding Event: [1:Open Surgical Wound] [N/A:N/A] Primary Etiology:  [1:Anemia, Hypertension, Osteoarthritis,] [N/A:N/A] Comorbid History: [1:Confinement Anxiety 06/29/2019] [N/A:N/A] Date Acquired: [1:1] [N/A:N/A] Weeks of Treatment: [1:Open] [N/A:N/A] Wound Status: [1:5.5x2.5x4.2] [N/A:N/A] Measurements L x W x D (cm) [1:10.799] [N/A:N/A] A (cm) : rea [1:45.357] [N/A:N/A] Volume (cm) : [1:24.50%] [N/A:N/A] % Reduction in A rea: [1:45.30%] [N/A:N/A] % Reduction in Volume: [1:8] Starting Position 1 (o'clock): [1:4] Ending Position 1 (o'clock): [1:6.5] Maximum Distance 1 (cm): [1:Yes] [  N/A:N/A] Undermining: [1:Full Thickness Without Exposed] [N/A:N/A] Classification: [1:Support Structures Large] [N/A:N/A] Exudate Amount: [1:Serosanguineous] [N/A:N/A] Exudate Type: [1:red, brown] [N/A:N/A] Exudate Color: [1:Yes] [N/A:N/A] Foul Odor A Cleansing: [1:fter No] [N/A:N/A] Odor Anticipated Due to Product Use: [1:Well defined, not attached] [N/A:N/A] Wound Margin: [1:Medium (34-66%)] [N/A:N/A] Granulation A mount: [1:Red] [N/A:N/A] Granulation Quality: [1:Medium (34-66%)] [N/A:N/A] Necrotic Amount: [1:Fat Layer (Subcutaneous Tissue): Yes N/A] Exposed Structures: [1:Fascia: No Tendon: No Muscle: No Joint: No Bone: No None] [N/A:N/A] Treatment Notes Electronic Signature(s) Signed: 10/23/2019 4:22:50 PM By: Linton Ham MD Signed: 10/23/2019 4:24:12 PM By: Levan Hurst RN, BSN Entered By: Linton Ham on 10/23/2019 13:26:37 -------------------------------------------------------------------------------- Multi-Disciplinary Care Plan Details Patient Name: Date of Service: Melissa Spray, MA [name] 10/23/2019 12:45 PM Medical Record Number: [id number] Patient Account Number: [id number] Date of Birth/Sex: Treating RN: Nov 03, 1975 (44 y.o. [name] Primary Care Dereon Williamsen: Elio Forget RD, Hillery Aldo Other Clinician: Referring Symiah Nowotny: Treating Bartow Zylstra/Extender: Johnella Moloney RD, [place] RREN Weeks in Treatment: 1 Active Inactive Nutrition Nursing  Diagnoses: Potential for alteratiion in Nutrition/Potential for imbalanced nutrition Goals: Patient/caregiver agrees to and verbalizes understanding of need to use nutritional supplements and/or vitamins as prescribed Date Initiated: 10/16/2019 Target Resolution Date: 11/17/2019 Goal Status: Active Interventions: Assess patient nutrition upon admission and as needed per policy Provide education on nutrition Treatment Activities: Education provided on Nutrition : 10/16/2019 Notes: Wound/Skin Impairment Nursing Diagnoses: Impaired tissue integrity Knowledge deficit related to ulceration/compromised skin integrity Goals: Patient/caregiver will verbalize understanding of skin care regimen Date Initiated: 10/16/2019 Target Resolution Date: 11/17/2019 Goal Status: Active Ulcer/skin breakdown will have a volume reduction of 30% by week 4 Date Initiated: 10/16/2019 Target Resolution Date: 11/17/2019 Goal Status: Active Interventions: Assess patient/caregiver ability to obtain necessary supplies Assess patient/caregiver ability to perform ulcer/skin care regimen upon admission and as needed Assess ulceration(s) every visit Provide education on ulcer and skin care Notes: Electronic Signature(s) Signed: 10/23/2019 4:24:12 PM By: Levan Hurst RN, BSN Entered By: Levan Hurst on 10/23/2019 13:13:29 -------------------------------------------------------------------------------- Pain Assessment Details Patient Name: Date of Service: Melissa Spray, MA [name] 10/23/2019 12:45 PM Medical Record Number: [id number] Patient Account Number: [id number] Date of Birth/Sex: Treating RN: 07-Apr-1975 (44 y.o. [name] Primary Care Renleigh Ouellet: [place], Hillery Aldo Other Clinician: Referring Sheilla Maris: Treating Gracelynn Bircher/Extender: Johnella Moloney RD, [place] RREN Weeks in Treatment: 1 Active Problems Location of Pain Severity and Description of Pain Patient Has Paino No Site Locations Rate the  pain. Rate the pain. Current Pain Level: 0 Character of Pain Describe the Pain: Tender Pain Management and Medication Current Pain Management: Electronic Signature(s) Signed: 10/23/2019 4:32:31 PM By: Baruch Gouty RN, BSN Entered By: Baruch Gouty on 10/23/2019 12:57:38 -------------------------------------------------------------------------------- Patient/Caregiver Education Details Patient Name: Date of Service: Melissa Spray, MA [name] 8/30/2021andnbsp12:45 PM Medical Record Number: [id number] Patient Account Number: [id number] Date of Birth/Gender: Treating RN: 02-Feb-1976 (44 y.o. [name] Primary Care Physician: [place], [place] Other Clinician: Referring Physician: Treating Physician/Extender: Johnella Moloney RD, [place] RREN Weeks in Treatment: 1 Education Assessment Education Provided To: Patient Education Topics Provided Wound/Skin Impairment: Methods: Explain/Verbal Responses: State content correctly Electronic Signature(s) Signed: 10/23/2019 4:24:12 PM By: Levan Hurst RN, BSN Entered By: Levan Hurst on 10/23/2019 13:13:43 -------------------------------------------------------------------------------- Wound Assessment Details Patient Name: Date of Service: Melissa Spray, MA [name] 10/23/2019 12:45 PM Medical Record Number: [id number] Patient Account Number: [id number] Date of Birth/Sex: Treating RN: [birth year] (44 y.o. [name] Primary Care Ethon Wymer: Other Clinician: [place], Hillery Aldo Referring  Regine Christian: Treating Chaden Doom/Extender: Johnella Moloney RD, [place] RREN Weeks in Treatment: 1 Wound Status Wound Number: 1 Primary Etiology: Open Surgical Wound Wound Location: Abdomen - midline Wound Status: Open Wounding Event: Surgical Injury Comorbid History: Anemia, Hypertension, Osteoarthritis, Confinement Anxiety Date Acquired: 06/29/2019 Weeks Of Treatment: 1 Clustered Wound: No Wound Measurements Length: (cm) 5.5 Width: (cm)  2.5 Depth: (cm) 4.2 Area: (cm) 10.799 Volume: (cm) 45.357 % Reduction in Area: 24.5% % Reduction in Volume: 45.3% Epithelialization: None Tunneling: No Undermining: Yes Starting Position (o'clock): 8 Ending Position (o'clock): 4 Maximum Distance: (cm) 6.5 Wound Description Classification: Full Thickness Without Exposed Support Structures Wound Margin: Well defined, not attached Exudate Amount: Large Exudate Type: Serosanguineous Exudate Color: red, brown Foul Odor After Cleansing: Yes Due to Product Use: No Slough/Fibrino No Wound Bed Granulation Amount: Medium (34-66%) Exposed Structure Granulation Quality: Red Fascia Exposed: No Necrotic Amount: Medium (34-66%) Fat Layer (Subcutaneous Tissue) Exposed: Yes Necrotic Quality: Adherent Slough Tendon Exposed: No Muscle Exposed: No Joint Exposed: No Bone Exposed: No Treatment Notes Wound #1 (Abdomen - midline) 1. Cleanse With Wound Cleanser 2. Periwound Care Skin Prep 3. Primary Dressing Applied Other primary dressing (specifiy in notes) 4. Secondary Dressing ABD Pad 5. Secured With Tape Notes wet to dry kerlix packing. HH to apply wound vac Electronic Signature(s) Signed: 10/23/2019 4:32:31 PM By: Baruch Gouty RN, BSN Entered By: Baruch Gouty on 10/23/2019 13:07:22 -------------------------------------------------------------------------------- [place] Details Patient Name: Date of Service: Melissa Spray, MA [name] 10/23/2019 12:45 PM Medical Record Number: [id number] Patient Account Number: [id number] Date of Birth/Sex: Treating RN: [birth year] (44 y.o. [name] Primary Care Airyanna Dipalma: [place], Hillery Aldo Other Clinician: Referring Collis Thede: Treating Olof Marcil/Extender: Johnella Moloney RD, [place] RREN Weeks in Treatment: 1 Vital Signs Time Taken: 12:57 Temperature (F): 98.7 Height (in): 66 Pulse (bpm): 98 Source: Stated Respiratory Rate (breaths/min): 18 Weight (lbs): 235 Blood Pressure  (mmHg): 121/80 Source: Stated Reference Range: 80 - 120 mg / dl Body Mass Index (BMI): 37.9 Electronic Signature(s) Signed: 10/23/2019 4:32:31 PM By: Baruch Gouty RN, BSN Entered By: Baruch Gouty on 10/23/2019 12:57:27

## 2019-10-23 NOTE — Progress Notes (Signed)
Melissa, [name] ([id number]) Visit Report for 10/23/2019 HPI Details Patient Name: Date of Service: [name], [place] [name] 10/23/2019 12:45 PM Medical Record Number: [id number] Patient Account Number: [id number] Date of Birth/Sex: Treating RN: [birth year] (44 y.o. [name] Primary Care Provider: Elio Forget RD, Hillery Aldo Other Clinician: Referring Provider: Treating Provider/Extender: Johnella Moloney RD, [place] RREN Weeks in Treatment: 1 History of Present Illness HPI Description: ADMISSION 10/16/2019 This is a 44 year old woman who apparently had a perforated diverticulum in the mid 2000's. She required a colostomy and then reversal. She did well up until 2009 she required an incisional hernia repair at an outside hospital. She had a gastric sleeve procedure as well in 2015. Sometime after this she developed an abdominal seroma. She had abdominal drains as initial management. She had drainage of the seroma cavity in 2016 in the OR. This resulted in resolution of the seroma cavity for a long period of time. She represented in December of last year year with a large amount of fluid. Her last CT scan was in April noted that she had a small residual fluid collection. However because of continued drainage it was elected that she would go ahead with a surgical procedure for debridement of the seroma cavity. She was taken to the OR on Jun 29, 2019. The measurements of the area were 15 x 15 x 3 at that point. A wound VAC was placed postoperatively. She tells me she loses a lot of blood in the wound VAC and she is iron deficient. Last hemoglobin I see was 11.9 on 06/27/2019. Her albumin was normal. The patient comes in the clinic here very frustrated with the lack of progress. She says the wound is not healed at all with the wound VAC. She continues to have what she describes as a large amount of sanguinous drainage and she changes the canister every day. She is not having any pain or fever. She  is wondering what her options are given the fact that she believes the wound VAC to be ineffective. Past medical history includes bipolar disorder, sarcoidosis, TIA, perforated diverticulum in the mid 2000's, incisional hernia repair in 2009, gastric sleeve procedure in 2015, hypertension 10/23/2019; there has been some improvement in her dimensions. She still changes the canister every 30 hours. I have not cultured this but there is an odor. Current dimensions are 5.5 x 2.5 x 4.2 with 6.5 cm of maximum undermining superiorly. Electronic Signature(s) Signed: 10/23/2019 4:22:50 PM By: Linton Ham MD Entered By: Linton Ham on 10/23/2019 13:27:41 -------------------------------------------------------------------------------- Physical Exam Details Patient Name: Date of Service: Melissa Spray, [place] [name] 10/23/2019 12:45 PM Medical Record Number: [id number] Patient Account Number: [id number] Date of Birth/Sex: Treating RN: [birth year] (44 y.o. [name] Primary Care Provider: [place], Hillery Aldo Other Clinician: Referring Provider: Treating Provider/Extender: Johnella Moloney RD, [place] RREN Weeks in Treatment: 1 Constitutional Sitting or standing Blood Pressure is within target range for patient.. Pulse regular and within target range for patient.Marland Kitchen Respirations regular, non-labored and within target range.. Temperature is normal and within the target range for the patient.Marland Kitchen Appears in no distress. Gastrointestinal (GI) Abdomen is soft and non-distended without masses or tenderness.. Notes Wound exam; the areas on the mid abdomen. Current dimensions are improved, last time at 10.3 cm in maximal undermining from 9-3 o'clock today at 6.5. If this is accurate and consistent this is a fairly major improvement. There is no evidence of surrounding infection no tenderness. Electronic Signature(s) Signed:  10/23/2019 4:22:50 PM By: Linton Ham MD Entered By: Linton Ham on 10/23/2019  13:28:40 -------------------------------------------------------------------------------- Physician Orders Details Patient Name: Date of Service: Melissa Spray, [place] [name] 10/23/2019 12:45 PM Medical Record Number: [id number] Patient Account Number: [id number] Date of Birth/Sex: Treating RN: June 02, 1975 (44 y.o. [name] Primary Care Provider: [place], Hillery Aldo Other Clinician: Referring Provider: Treating Provider/Extender: Johnella Moloney RD, [place] RREN Weeks in Treatment: 1 Verbal / Phone Orders: No Diagnosis Coding ICD-10 Coding Code Description T81.31XA Disruption of external operation (surgical) wound, not elsewhere classified, initial encounter K91.873 Postprocedural seroma of a digestive system organ or structure following other procedure S31.105S Unspecified open wound of abdominal wall, periumbilic region without penetration into peritoneal cavity, sequela Follow-up Appointments Return Appointment in 2 weeks. Dressing Change Frequency Wound #1 Abdomen - midline Change dressing three times week. - by home health Skin Barriers/Peri-Wound Care Wound #1 Abdomen - midline Skin Prep Wound Cleansing Wound #1 Abdomen - midline Clean wound with Wound Cleanser - or normal saline Primary Wound Dressing Wound #1 Abdomen - midline Other: - wet-to-dry today in clinic, home health to apply wound vac Negative Presssure Wound Therapy Wound #1 Abdomen - midline Wound Vac to wound continuously at 16mm/hg pressure Black and White Foam combination - white foam in undermining/tunneling areas [facility] for wound care. - [facility] Electronic Signature(s) Signed: 10/23/2019 4:22:50 PM By: Linton Ham MD Signed: 10/23/2019 4:24:12 PM By: Levan Hurst RN, BSN Entered By: Levan Hurst on 10/23/2019 13:25:05 -------------------------------------------------------------------------------- Problem List Details Patient Name: Date of  Service: Melissa Spray, [place] [name] 10/23/2019 12:45 PM Medical Record Number: [id number] Patient Account Number: [id number] Date of Birth/Sex: Treating RN: 31-Jan-1976 (44 y.o. [name] Primary Care Provider: [place], Hillery Aldo Other Clinician: Referring Provider: Treating Provider/Extender: Johnella Moloney RD, [place] RREN Weeks in Treatment: 1 Active Problems ICD-10 Encounter Code Description Active Date MDM Diagnosis T81.31XA Disruption of external operation (surgical) wound, not elsewhere classified, 10/16/2019 No Yes initial encounter K91.873 Postprocedural seroma of a digestive system organ or structure following 10/16/2019 No Yes other procedure S31.105S Unspecified open wound of abdominal wall, periumbilic region without 4/31/5400 No Yes penetration into peritoneal cavity, sequela Inactive Problems Resolved Problems Electronic Signature(s) Signed: 10/23/2019 4:22:50 PM By: Linton Ham MD Entered By: Linton Ham on 10/23/2019 13:26:09 -------------------------------------------------------------------------------- Progress Note Details Patient Name: Date of Service: Melissa Spray, [place] [name] 10/23/2019 12:45 PM Medical Record Number: [id number] Patient Account Number: [id number] Date of Birth/Sex: Treating RN: 02-14-1976 (44 y.o. [name] Primary Care Provider: Elio Forget RD, Hillery Aldo Other Clinician: Referring Provider: Treating Provider/Extender: Johnella Moloney RD, [place] RREN Weeks in Treatment: 1 Subjective History of Present Illness (HPI) ADMISSION 10/16/2019 This is a 44 year old woman who apparently had a perforated diverticulum in the mid 2000's. She required a colostomy and then reversal. She did well up until 2009 she required an incisional hernia repair at an outside hospital. She had a gastric sleeve procedure as well in 2015. Sometime after this she developed an abdominal seroma. She had abdominal drains as initial management. She had drainage of the  seroma cavity in 2016 in the OR. This resulted in resolution of the seroma cavity for a long period of time. She represented in December of last year year with a large amount of fluid. Her last CT scan was in April noted that she had a small residual fluid collection. However because of continued drainage it was elected that  she would go ahead with a surgical procedure for debridement of the seroma cavity. She was taken to the OR on Jun 29, 2019. The measurements of the area were 15 x 15 x 3 at that point. A wound VAC was placed postoperatively. She tells me she loses a lot of blood in the wound VAC and she is iron deficient. Last hemoglobin I see was 11.9 on 06/27/2019. Her albumin was normal. The patient comes in the clinic here very frustrated with the lack of progress. She says the wound is not healed at all with the wound VAC. She continues to have what she describes as a large amount of sanguinous drainage and she changes the canister every day. She is not having any pain or fever. She is wondering what her options are given the fact that she believes the wound VAC to be ineffective. Past medical history includes bipolar disorder, sarcoidosis, TIA, perforated diverticulum in the mid 2000's, incisional hernia repair in 2009, gastric sleeve procedure in 2015, hypertension 10/23/2019; there has been some improvement in her dimensions. She still changes the canister every 30 hours. I have not cultured this but there is an odor. Current dimensions are 5.5 x 2.5 x 4.2 with 6.5 cm of maximum undermining superiorly. Objective Constitutional Sitting or standing Blood Pressure is within target range for patient.. Pulse regular and within target range for patient.Marland Kitchen Respirations regular, non-labored and within target range.. Temperature is normal and within the target range for the patient.Marland Kitchen Appears in no distress. Vitals Time Taken: 12:57 PM, Height: 66 in, Source: Stated, Weight: 235 lbs, Source: Stated,  BMI: 37.9, Temperature: 98.7 F, Pulse: 98 bpm, Respiratory Rate: 18 breaths/min, Blood Pressure: 121/80 mmHg. Gastrointestinal (GI) Abdomen is soft and non-distended without masses or tenderness.. General Notes: Wound exam; the areas on the mid abdomen. Current dimensions are improved, last time at 10.3 cm in maximal undermining from 9-3 o'clock today at 6.5. If this is accurate and consistent this is a fairly major improvement. There is no evidence of surrounding infection no tenderness. Integumentary (Hair, Skin) Wound #1 status is Open. Original cause of wound was Surgical Injury. The wound is located on the Abdomen - midline. The wound measures 5.5cm length x 2.5cm width x 4.2cm depth; 10.799cm^2 area and 45.357cm^3 volume. There is Fat Layer (Subcutaneous Tissue) exposed. There is no tunneling noted, however, there is undermining starting at 8:00 and ending at 4:00 with a maximum distance of 6.5cm. There is a large amount of serosanguineous drainage noted. Foul odor after cleansing was noted. The wound margin is well defined and not attached to the wound base. There is medium (34-66%) red granulation within the wound bed. There is a medium (34-66%) amount of necrotic tissue within the wound bed including Adherent Slough. Assessment Active Problems ICD-10 Disruption of external operation (surgical) wound, not elsewhere classified, initial encounter Postprocedural seroma of a digestive system organ or structure following other procedure Unspecified open wound of abdominal wall, periumbilic region without penetration into peritoneal cavity, sequela Plan Follow-up Appointments: Return Appointment in 2 weeks. Dressing Change Frequency: Wound #1 Abdomen - midline: Change dressing three times week. - by home health Skin Barriers/Peri-Wound Care: Wound #1 Abdomen - midline: Skin Prep Wound Cleansing: Wound #1 Abdomen - midline: Clean wound with Wound Cleanser - or normal saline Primary  Wound Dressing: Wound #1 Abdomen - midline: Other: - wet-to-dry today in clinic, home health to apply wound vac Negative Presssure Wound Therapy: Wound #1 Abdomen - midline: Wound Vac to wound  continuously at 181mm/hg pressure Black and White Foam combination - white foam in undermining/tunneling areas Home Health: [facility] skilled [facility] for wound care. - [facility] 1.continue the wounbd vac 2 no evidence of infection 3 she has an appointment with Drs Hoffman/Pace on wednesday, I urged her to keep the appoitment. Electronic Signature(s) Signed: 10/23/2019 4:22:50 PM By: Linton Ham MD Entered By: Linton Ham on 10/23/2019 13:30:39 -------------------------------------------------------------------------------- SuperBill Details Patient Name: Date of Service: Melissa Spray, [place] [name] 10/23/2019 Medical Record Number: [id number] Patient Account Number: [id number] Date of Birth/Sex: Treating RN: Jun 26, 1975 (44 y.o. [name] Primary Care Provider: [place], [place] Other Clinician: Referring Provider: Treating Provider/Extender: Johnella Moloney RD, [place] RREN Weeks in Treatment: 1 Diagnosis Coding ICD-10 Codes Code Description T81.31XA Disruption of external operation (surgical) wound, not elsewhere classified, initial encounter K91.873 Postprocedural seroma of a digestive system organ or structure following other procedure S31.105S Unspecified open wound of abdominal wall, periumbilic region without penetration into peritoneal cavity, sequela Facility Procedures CPT4 Code: 08811031 Description: 99213 - WOUND CARE VISIT-LEV 3 EST PT Modifier: Quantity: 1 Physician Procedures : CPT4 Code Description Modifier [id number] - WC PHYS LEVEL 3 - EST PT ICD-10 Diagnosis Description T81.31XA Disruption of external operation (surgical) wound, not elsewhere classified, initial encounter K91.873 Postprocedural seroma of a digestive  system organ or structure  following other procedure S31.105S Unspecified open wound of abdominal wall, periumbilic region without penetration into peritoneal cavi Quantity: 1 ty, sequela Electronic Signature(s) Signed: 10/23/2019 4:22:50 PM By: Linton Ham MD Signed: 10/23/2019 4:24:12 PM By: Levan Hurst RN, BSN Entered By: Levan Hurst on 10/23/2019 13:40:24

## 2019-10-25 ENCOUNTER — Institutional Professional Consult (permissible substitution): Payer: Medicare Other | Admitting: Internal Medicine

## 2019-10-25 DIAGNOSIS — D869 Sarcoidosis, unspecified: Secondary | ICD-10-CM | POA: Diagnosis not present

## 2019-10-25 DIAGNOSIS — F319 Bipolar disorder, unspecified: Secondary | ICD-10-CM | POA: Diagnosis not present

## 2019-10-25 DIAGNOSIS — D509 Iron deficiency anemia, unspecified: Secondary | ICD-10-CM | POA: Diagnosis not present

## 2019-10-25 DIAGNOSIS — K91872 Postprocedural seroma of a digestive system organ or structure following a digestive system procedure: Secondary | ICD-10-CM | POA: Diagnosis not present

## 2019-10-25 DIAGNOSIS — F419 Anxiety disorder, unspecified: Secondary | ICD-10-CM | POA: Diagnosis not present

## 2019-10-25 DIAGNOSIS — F209 Schizophrenia, unspecified: Secondary | ICD-10-CM | POA: Diagnosis not present

## 2019-10-27 DIAGNOSIS — F319 Bipolar disorder, unspecified: Secondary | ICD-10-CM | POA: Diagnosis not present

## 2019-10-27 DIAGNOSIS — D509 Iron deficiency anemia, unspecified: Secondary | ICD-10-CM | POA: Diagnosis not present

## 2019-10-27 DIAGNOSIS — D869 Sarcoidosis, unspecified: Secondary | ICD-10-CM | POA: Diagnosis not present

## 2019-10-27 DIAGNOSIS — F209 Schizophrenia, unspecified: Secondary | ICD-10-CM | POA: Diagnosis not present

## 2019-10-27 DIAGNOSIS — F419 Anxiety disorder, unspecified: Secondary | ICD-10-CM | POA: Diagnosis not present

## 2019-10-27 DIAGNOSIS — K91872 Postprocedural seroma of a digestive system organ or structure following a digestive system procedure: Secondary | ICD-10-CM | POA: Diagnosis not present

## 2019-10-29 DIAGNOSIS — G4730 Sleep apnea, unspecified: Secondary | ICD-10-CM | POA: Diagnosis not present

## 2019-10-29 DIAGNOSIS — F988 Other specified behavioral and emotional disorders with onset usually occurring in childhood and adolescence: Secondary | ICD-10-CM | POA: Diagnosis not present

## 2019-10-29 DIAGNOSIS — Z791 Long term (current) use of non-steroidal anti-inflammatories (NSAID): Secondary | ICD-10-CM | POA: Diagnosis not present

## 2019-10-29 DIAGNOSIS — F209 Schizophrenia, unspecified: Secondary | ICD-10-CM | POA: Diagnosis not present

## 2019-10-29 DIAGNOSIS — F429 Obsessive-compulsive disorder, unspecified: Secondary | ICD-10-CM | POA: Diagnosis not present

## 2019-10-29 DIAGNOSIS — Z79891 Long term (current) use of opiate analgesic: Secondary | ICD-10-CM | POA: Diagnosis not present

## 2019-10-29 DIAGNOSIS — D509 Iron deficiency anemia, unspecified: Secondary | ICD-10-CM | POA: Diagnosis not present

## 2019-10-29 DIAGNOSIS — Z87442 Personal history of urinary calculi: Secondary | ICD-10-CM | POA: Diagnosis not present

## 2019-10-29 DIAGNOSIS — F419 Anxiety disorder, unspecified: Secondary | ICD-10-CM | POA: Diagnosis not present

## 2019-10-29 DIAGNOSIS — D869 Sarcoidosis, unspecified: Secondary | ICD-10-CM | POA: Diagnosis not present

## 2019-10-29 DIAGNOSIS — K219 Gastro-esophageal reflux disease without esophagitis: Secondary | ICD-10-CM | POA: Diagnosis not present

## 2019-10-29 DIAGNOSIS — Z79899 Other long term (current) drug therapy: Secondary | ICD-10-CM | POA: Diagnosis not present

## 2019-10-29 DIAGNOSIS — Z7902 Long term (current) use of antithrombotics/antiplatelets: Secondary | ICD-10-CM | POA: Diagnosis not present

## 2019-10-29 DIAGNOSIS — R918 Other nonspecific abnormal finding of lung field: Secondary | ICD-10-CM | POA: Diagnosis not present

## 2019-10-29 DIAGNOSIS — K91872 Postprocedural seroma of a digestive system organ or structure following a digestive system procedure: Secondary | ICD-10-CM | POA: Diagnosis not present

## 2019-10-29 DIAGNOSIS — F319 Bipolar disorder, unspecified: Secondary | ICD-10-CM | POA: Diagnosis not present

## 2019-10-29 DIAGNOSIS — Z8673 Personal history of transient ischemic attack (TIA), and cerebral infarction without residual deficits: Secondary | ICD-10-CM | POA: Diagnosis not present

## 2019-10-29 DIAGNOSIS — Z9884 Bariatric surgery status: Secondary | ICD-10-CM | POA: Diagnosis not present

## 2019-10-30 DIAGNOSIS — F319 Bipolar disorder, unspecified: Secondary | ICD-10-CM | POA: Diagnosis not present

## 2019-10-30 DIAGNOSIS — F209 Schizophrenia, unspecified: Secondary | ICD-10-CM | POA: Diagnosis not present

## 2019-10-30 DIAGNOSIS — K91872 Postprocedural seroma of a digestive system organ or structure following a digestive system procedure: Secondary | ICD-10-CM | POA: Diagnosis not present

## 2019-10-30 DIAGNOSIS — F419 Anxiety disorder, unspecified: Secondary | ICD-10-CM | POA: Diagnosis not present

## 2019-10-30 DIAGNOSIS — D509 Iron deficiency anemia, unspecified: Secondary | ICD-10-CM | POA: Diagnosis not present

## 2019-10-30 DIAGNOSIS — D869 Sarcoidosis, unspecified: Secondary | ICD-10-CM | POA: Diagnosis not present

## 2019-11-01 DIAGNOSIS — D509 Iron deficiency anemia, unspecified: Secondary | ICD-10-CM | POA: Diagnosis not present

## 2019-11-01 DIAGNOSIS — K91872 Postprocedural seroma of a digestive system organ or structure following a digestive system procedure: Secondary | ICD-10-CM | POA: Diagnosis not present

## 2019-11-01 DIAGNOSIS — F419 Anxiety disorder, unspecified: Secondary | ICD-10-CM | POA: Diagnosis not present

## 2019-11-01 DIAGNOSIS — D869 Sarcoidosis, unspecified: Secondary | ICD-10-CM | POA: Diagnosis not present

## 2019-11-01 DIAGNOSIS — F209 Schizophrenia, unspecified: Secondary | ICD-10-CM | POA: Diagnosis not present

## 2019-11-01 DIAGNOSIS — F319 Bipolar disorder, unspecified: Secondary | ICD-10-CM | POA: Diagnosis not present

## 2019-11-02 DIAGNOSIS — Z23 Encounter for immunization: Secondary | ICD-10-CM | POA: Diagnosis not present

## 2019-11-03 DIAGNOSIS — D509 Iron deficiency anemia, unspecified: Secondary | ICD-10-CM | POA: Diagnosis not present

## 2019-11-03 DIAGNOSIS — D869 Sarcoidosis, unspecified: Secondary | ICD-10-CM | POA: Diagnosis not present

## 2019-11-03 DIAGNOSIS — F419 Anxiety disorder, unspecified: Secondary | ICD-10-CM | POA: Diagnosis not present

## 2019-11-03 DIAGNOSIS — F319 Bipolar disorder, unspecified: Secondary | ICD-10-CM | POA: Diagnosis not present

## 2019-11-03 DIAGNOSIS — K91872 Postprocedural seroma of a digestive system organ or structure following a digestive system procedure: Secondary | ICD-10-CM | POA: Diagnosis not present

## 2019-11-03 DIAGNOSIS — F209 Schizophrenia, unspecified: Secondary | ICD-10-CM | POA: Diagnosis not present

## 2019-11-06 DIAGNOSIS — F209 Schizophrenia, unspecified: Secondary | ICD-10-CM | POA: Diagnosis not present

## 2019-11-06 DIAGNOSIS — K91872 Postprocedural seroma of a digestive system organ or structure following a digestive system procedure: Secondary | ICD-10-CM | POA: Diagnosis not present

## 2019-11-06 DIAGNOSIS — D509 Iron deficiency anemia, unspecified: Secondary | ICD-10-CM | POA: Diagnosis not present

## 2019-11-06 DIAGNOSIS — F419 Anxiety disorder, unspecified: Secondary | ICD-10-CM | POA: Diagnosis not present

## 2019-11-06 DIAGNOSIS — F319 Bipolar disorder, unspecified: Secondary | ICD-10-CM | POA: Diagnosis not present

## 2019-11-06 DIAGNOSIS — D869 Sarcoidosis, unspecified: Secondary | ICD-10-CM | POA: Diagnosis not present

## 2019-11-08 DIAGNOSIS — F319 Bipolar disorder, unspecified: Secondary | ICD-10-CM | POA: Diagnosis not present

## 2019-11-08 DIAGNOSIS — F419 Anxiety disorder, unspecified: Secondary | ICD-10-CM | POA: Diagnosis not present

## 2019-11-08 DIAGNOSIS — K91872 Postprocedural seroma of a digestive system organ or structure following a digestive system procedure: Secondary | ICD-10-CM | POA: Diagnosis not present

## 2019-11-08 DIAGNOSIS — D509 Iron deficiency anemia, unspecified: Secondary | ICD-10-CM | POA: Diagnosis not present

## 2019-11-08 DIAGNOSIS — D869 Sarcoidosis, unspecified: Secondary | ICD-10-CM | POA: Diagnosis not present

## 2019-11-08 DIAGNOSIS — F209 Schizophrenia, unspecified: Secondary | ICD-10-CM | POA: Diagnosis not present

## 2019-11-09 ENCOUNTER — Other Ambulatory Visit: Payer: Self-pay

## 2019-11-09 ENCOUNTER — Inpatient Hospital Stay: Payer: Medicare Other

## 2019-11-09 ENCOUNTER — Encounter: Payer: Self-pay | Admitting: Hematology and Oncology

## 2019-11-09 ENCOUNTER — Inpatient Hospital Stay: Payer: Medicare Other | Attending: Hematology and Oncology | Admitting: Hematology and Oncology

## 2019-11-09 VITALS — BP 133/69 | HR 103 | Temp 99.5°F | Resp 18 | Ht 66.0 in | Wt 241.1 lb

## 2019-11-09 DIAGNOSIS — E538 Deficiency of other specified B group vitamins: Secondary | ICD-10-CM

## 2019-11-09 DIAGNOSIS — D500 Iron deficiency anemia secondary to blood loss (chronic): Secondary | ICD-10-CM

## 2019-11-09 DIAGNOSIS — Z23 Encounter for immunization: Secondary | ICD-10-CM | POA: Insufficient documentation

## 2019-11-09 DIAGNOSIS — D75839 Thrombocytosis, unspecified: Secondary | ICD-10-CM

## 2019-11-09 DIAGNOSIS — K9089 Other intestinal malabsorption: Secondary | ICD-10-CM | POA: Insufficient documentation

## 2019-11-09 DIAGNOSIS — D509 Iron deficiency anemia, unspecified: Secondary | ICD-10-CM | POA: Diagnosis not present

## 2019-11-09 LAB — CBC WITH DIFFERENTIAL (CANCER CENTER ONLY)
Abs Immature Granulocytes: 0.03 10*3/uL (ref 0.00–0.07)
Basophils Absolute: 0 10*3/uL (ref 0.0–0.1)
Basophils Relative: 0 %
Eosinophils Absolute: 0.4 10*3/uL (ref 0.0–0.5)
Eosinophils Relative: 5 %
HCT: 33.5 % — ABNORMAL LOW (ref 36.0–46.0)
Hemoglobin: 10 g/dL — ABNORMAL LOW (ref 12.0–15.0)
Immature Granulocytes: 0 %
Lymphocytes Relative: 13 %
Lymphs Abs: 1 10*3/uL (ref 0.7–4.0)
MCH: 25.2 pg — ABNORMAL LOW (ref 26.0–34.0)
MCHC: 29.9 g/dL — ABNORMAL LOW (ref 30.0–36.0)
MCV: 84.4 fL (ref 80.0–100.0)
Monocytes Absolute: 0.6 10*3/uL (ref 0.1–1.0)
Monocytes Relative: 7 %
Neutro Abs: 5.4 10*3/uL (ref 1.7–7.7)
Neutrophils Relative %: 75 %
Platelet Count: 459 10*3/uL — ABNORMAL HIGH (ref 150–400)
RBC: 3.97 MIL/uL (ref 3.87–5.11)
RDW: 15.2 % (ref 11.5–15.5)
WBC Count: 7.4 10*3/uL (ref 4.0–10.5)
nRBC: 0 % (ref 0.0–0.2)

## 2019-11-09 LAB — IRON AND TIBC
Iron: 16 ug/dL — ABNORMAL LOW (ref 41–142)
Saturation Ratios: 6 % — ABNORMAL LOW (ref 21–57)
TIBC: 254 ug/dL (ref 236–444)
UIBC: 238 ug/dL (ref 120–384)

## 2019-11-09 LAB — CMP (CANCER CENTER ONLY)
ALT: 12 U/L (ref 0–44)
AST: 12 U/L — ABNORMAL LOW (ref 15–41)
Albumin: 2.9 g/dL — ABNORMAL LOW (ref 3.5–5.0)
Alkaline Phosphatase: 130 U/L — ABNORMAL HIGH (ref 38–126)
Anion gap: 9 (ref 5–15)
BUN: 4 mg/dL — ABNORMAL LOW (ref 6–20)
CO2: 27 mmol/L (ref 22–32)
Calcium: 9.1 mg/dL (ref 8.9–10.3)
Chloride: 100 mmol/L (ref 98–111)
Creatinine: 0.62 mg/dL (ref 0.44–1.00)
GFR, Est AFR Am: >60 mL/min (ref >60)
GFR, Estimated: >60 mL/min (ref >60)
Glucose, Bld: 82 mg/dL (ref 70–99)
Potassium: 3.5 mmol/L (ref 3.5–5.1)
Sodium: 136 mmol/L (ref 135–145)
Total Bilirubin: <0.2 mg/dL — ABNORMAL LOW (ref 0.3–1.2)
Total Protein: 7.6 g/dL (ref 6.5–8.1)

## 2019-11-09 LAB — FOLATE: Folate: 12 ng/mL (ref >5.9)

## 2019-11-09 LAB — FERRITIN: Ferritin: 63 ng/mL (ref 11–307)

## 2019-11-09 LAB — RETIC PANEL
Immature Retic Fract: 26.7 % — ABNORMAL HIGH (ref 2.3–15.9)
RBC.: 4.02 MIL/uL (ref 3.87–5.11)
Retic Count, Absolute: 75.6 10*3/uL (ref 19.0–186.0)
Retic Ct Pct: 1.9 % (ref 0.4–3.1)
Reticulocyte Hemoglobin: 27.9 pg — ABNORMAL LOW (ref >27.9)

## 2019-11-09 LAB — VITAMIN B12: Vitamin B-12: 287 pg/mL (ref 180–914)

## 2019-11-09 NOTE — Progress Notes (Signed)
[place] [phone]   Fax:([phone] [place] NOTE  Patient Care Team: Susy Frizzle, MD as PCP - General (Family Medicine) Greer Pickerel, MD as Consulting Physician (General Surgery)  Hematological/Oncological History # Iron Deficiency Anemia 1) 02/25/2019: WBC 8.4, Hgb 8.5, MCV 78.8, Plt 524. Received 510mg  IV feraheme.  2) 06/12/2019: WBC 7.1, Hgb 12.0, MCV 88.3, Plt 271 3) 10/11/2019: WBC 4.9, Hgb 9.3, MCV 84, Plt 461 4) 11/09/2019: establish care with Dr. Lorenso Courier   CHIEF COMPLAINTS/PURPOSE OF CONSULTATION:  "Iron Deficiency anemia "  HISTORY OF PRESENTING ILLNESS:  [name] 44 y.o. female with medical history significant for excisional debridement of chronic abdominal wall seroma (treated in 2017 and 2020) with chronic wound vac in place, sleeve gastrectomy in 2015,  who presents for evaluation of anemia.   On review of the previous records [name] has a longstanding history of anemia dating back to at least January 2021.  On 02/27/2019 she had a hemoglobin of 7.5, MCV of 82.3, and a platelet count of 535 in the setting of an admission for sepsis.  More recently she had a normal hemoglobin on 06/12/2019 with a white blood cell count of 7.1, hemoglobin 12.0, and a platelet count of 271.  Unfortunately in the interim she had a drop in her blood counts with hemoglobin of 9.3, and platelets of 461 on last check 10/11/2019.  Due to concern for this drop in hemoglobin the patient was referred to hematology for further evaluation and management.  On exam today [name] notes that she has had issues with iron deficiency anemia on and off since she received her gastric sleeve procedure in 2015.  Most recently in January she endorses having received IV iron.  She currently takes p.o. iron therapy without any concerns for abdominal pain or constipation.  She notes that she is not having any overt signs of bleeding such as nosebleeds, bruising, or dark  stools, though her wound VAC does have a serosanguineous output.  She has received vitamin B12 before in the past, but reports that she has not had any in the last 4 months.  On further discussion she notes that she "lays around a lot" and that she mostly sits in bed and watches Netflix.  She notes that her energy level is at baseline and she has not noticed any new shortness of breath.  A full 10 point ROS is listed below.  MEDICAL HISTORY:  Past Medical History:  Diagnosis Date  . ADHD   . Anal fistula   . Anxiety   . Arthritis    pt denies 5/3 visit   . Bipolar affective ([place])   . Blood transfusion without reported diagnosis   . Colostomy in place [facility])   . CVA (cerebrovascular accident) ([place])    left cerebellar infarct found accidentally on CT (2020)   . Depression   . Diverticulitis 2008   perforated/ requiring resection  . Fatty liver   . Gallstones   . GERD (gastroesophageal reflux disease)    occ.  Marland Kitchen Headache    after a fall  . Herpes simplex   . History of colon polyps   . History of hiatal hernia    small  . History of kidney stones    cystoscopy  basket removal  . Hypertension   . Iron deficiency anemia   . Obese   . OCD (obsessive compulsive disorder)   . Panic attacks   . Pulmonary nodules   .  Sarcoidosis   . Schizophrenia ([place])   . Shortness of breath dyspnea    pt denies on 5/3 visit   . Sleep apnea    mild, does not use c-pap machine  . Substance abuse ([place])    12 years ago-crack cocaine    SURGICAL HISTORY: Past Surgical History:  Procedure Laterality Date  . Abdominal wall hernia  07/2007   open repair with lysis of adhesions  . APPLICATION OF WOUND VAC N/A 06/29/2019   Procedure: PLACEMENT OF WOUND VAC;  Surgeon: Greer Pickerel, MD;  Location: [facility] ORS;  Service: General;  Laterality: N/A;  . BRONCHIAL BRUSHINGS  04/11/2019   Procedure: BRONCHIAL BRUSHINGS;  Surgeon: Collene Gobble, MD;  Location: [facility] ENDOSCOPY;  Service: Pulmonary;;  . BRONCHIAL  WASHINGS  04/11/2019   Procedure: BRONCHIAL WASHINGS;  Surgeon: Collene Gobble, MD;  Location: [facility] ENDOSCOPY;  Service: Pulmonary;;  . COLONOSCOPY  03/13/2010   WUJ:[id number] hemorrhoids likely cause of hematochezia, otherwise normal  . COLONOSCOPY  05/24/2019  . COLONOSCOPY WITH ESOPHAGOGASTRODUODENOSCOPY (EGD)  04/2019  . Colostomy reversal  11/2006  . DEBRIDEMENT OF ABDOMINAL WALL ABSCESS N/A 06/29/2019   Procedure: EXCISIONAL DEBRIDEMENT OF ABDOMINAL WALL/SUBCU SEROMA;  Surgeon: Greer Pickerel, MD;  Location: [facility];  Service: General;  Laterality: N/A;  . ESOPHAGOGASTRODUODENOSCOPY  03/13/2010   GNF:[id number] esophagus, status post passage of a Maloney dilator/Small hiatal hernia/ Antral erosions, status post biopsy  . EXAMINATION UNDER ANESTHESIA N/A 05/11/2012   Procedure: EXAM UNDER ANESTHESIA;  Surgeon: Donato Heinz, MD;  Location: [facility];  Service: General;  Laterality: N/A;  . Exploratory laparotomy with resection  2008   colonoscopy  . FINE NEEDLE ASPIRATION  04/11/2019   Procedure: FINE NEEDLE ASPIRATION (FNA) LINEAR;  Surgeon: Collene Gobble, MD;  Location: [facility] ENDOSCOPY;  Service: Pulmonary;;  . FINGER CLOSED REDUCTION Right 08/05/2012   Procedure: CLOSED REDUCTION RIGHT THUMB (FINGER);  Surgeon: Linna Hoff, MD;  Location: [place];  Service: Orthopedics;  Laterality: Right;  . HERNIA REPAIR     incisional hernia surgery   . Hx of abd wall seroma  08/2007   Drained via [place] in [place], [place]  . Hx of abd wall seroma  10/2007   Drained by Dr. Geroge Baseman in office  . INCISION AND DRAINAGE ABSCESS N/A 06/29/2019   Procedure: INCISION AND DRAINAGE;  Surgeon: Greer Pickerel, MD;  Location: [facility];  Service: General;  Laterality: N/A;  . IR RADIOLOGIST EVAL & MGMT  03/15/2019  . IR RADIOLOGIST EVAL & MGMT  04/06/2019  . Kidney stones    . LAPAROSCOPIC GASTRIC SLEEVE RESECTION N/A 02/13/2014   Procedure: LAPAROSCOPIC GASTRIC SLEEVE RESECTION LYSIS OF ADHESIONS, UPPER ENDOSCOPY;  Surgeon:  [name], MD;  Location: [facility];  Service: General;  Laterality: N/A;  . LAPAROSCOPY N/A 09/28/2014   Procedure: LAPAROSCOPY DIAGNOSTIC, INCISION AND DRAINAGE WITH LAPAROSCOPIC EXPLORATION OF ABDOMINAL WALL SEROMA with ultrasound;  Surgeon: Greer Pickerel, MD;  Location: [facility];  Service: General;  Laterality: N/A;  . LUNG BIOPSY  04/11/2019   Procedure: LUNG BIOPSY;  Surgeon: Collene Gobble, MD;  Location: [facility] ENDOSCOPY;  Service: Pulmonary;;  distal trachea  . PLACEMENT OF SETON N/A 05/11/2012   Procedure: PLACEMENT OF SETON;  Surgeon: Donato Heinz, MD;  Location: [facility];  Service: General;  Laterality: N/A;  . root canal 7-16    . TONSILLECTOMY    . TREATMENT FISTULA ANAL     x 2  . UPPER GASTROINTESTINAL ENDOSCOPY  05/24/2019  . VIDEO BRONCHOSCOPY WITH ENDOBRONCHIAL ULTRASOUND N/A 04/11/2019   Procedure: VIDEO BRONCHOSCOPY WITH ENDOBRONCHIAL ULTRASOUND;  Surgeon: Collene Gobble, MD;  Location: [facility] ENDOSCOPY;  Service: Pulmonary;  Laterality: N/A;    SOCIAL HISTORY: Social History   Socioeconomic History  . Marital status: Single    Spouse name: Not on file  . Number of children: 2  . Years of education: 83   . Highest education level: Not on file  Occupational History  . Occupation: Insurance claims handler: UNEMPLOYED  Tobacco Use  . Smoking status: Former Smoker    Packs/day: 0.50    Years: 14.00    Pack years: 7.00    Types: E-cigarettes    Quit date: 12/20/2013    Years since quitting: 5.8  . Smokeless tobacco: Never Used  . Tobacco comment: Vapor cigarettes.  Vaping Use  . Vaping Use: Every day  . Last attempt to quit: 02/24/2019  . Substances: Nicotine, Flavoring  . Devices: started 2015  Substance and Sexual Activity  . Alcohol use: No    Alcohol/week: 0.0 standard drinks  . Drug use: Never  . Sexual activity: Not Currently    Birth control/protection: Abstinence  Other Topics Concern  . Not on file  Social History Narrative  . Not on file   Social  Determinants of Health   Financial Resource Strain:   . Difficulty of Paying Living Expenses: Not on file  Food Insecurity:   . Worried About Charity fundraiser in the Last Year: Not on file  . Ran Out of Food in the Last Year: Not on file  Transportation Needs:   . Lack of Transportation (Medical): Not on file  . Lack of Transportation (Non-Medical): Not on file  Physical Activity:   . Days of Exercise per Week: Not on file  . Minutes of Exercise per Session: Not on file  Stress:   . Feeling of Stress : Not on file  Social Connections:   . Frequency of Communication with Friends and Family: Not on file  . Frequency of Social Gatherings with Friends and Family: Not on file  . Attends Religious Services: Not on file  . Active Member of Clubs or Organizations: Not on file  . Attends Archivist Meetings: Not on file  . Marital Status: Not on file  Intimate Partner Violence:   . Fear of Current or Ex-Partner: Not on file  . Emotionally Abused: Not on file  . Physically Abused: Not on file  . Sexually Abused: Not on file    FAMILY HISTORY: Family History  Problem Relation Age of Onset  . Asthma Maternal Grandmother   . Colon polyps Maternal Grandmother   . Colon cancer Neg Hx   . Esophageal cancer Neg Hx   . Rectal cancer Neg Hx   . Stomach cancer Neg Hx     ALLERGIES:  has No Known Allergies.  MEDICATIONS:  Current Outpatient Medications  Medication Sig Dispense Refill  . Ascorbic Acid (VITAMIN C PO) Take by mouth.    . clopidogrel (PLAVIX) 75 MG tablet Take 75 mg by mouth daily.    . diazepam (VALIUM) 2 MG tablet Take 2 mg by mouth every 6 (six) hours as needed for anxiety.    Marland Kitchen NUVIGIL 250 MG tablet Take 250 mg by mouth daily.  5  . traMADol (ULTRAM) 50 MG tablet Take 100 mg by mouth every 6 (six) hours as needed.    Marland Kitchen amphetamine-dextroamphetamine (ADDERALL)  15 MG tablet Take 15 mg by mouth 3 (three) times daily.     Marland Kitchen atenolol (TENORMIN) 25 MG tablet Take  25 mg by mouth 2 (two) times daily.     Marland Kitchen atorvastatin (LIPITOR) 40 MG tablet TAKE 1 TABLET BY MOUTH EVERY DAY 90 tablet 1  . ferrous sulfate 325 (65 FE) MG tablet Take 325 mg by mouth daily with breakfast.    . fexofenadine (ALLEGRA) 180 MG tablet Take 180 mg by mouth daily as needed for rhinitis (sore throat). (Patient not taking: Reported on 09/08/2019)    . gabapentin (NEURONTIN) 300 MG capsule Take 300 mg by mouth 3 (three) times daily.    . Multiple Vitamins-Minerals (MULTIVITAMIN WITH MINERALS) tablet Take 1 tablet by mouth daily.     . ondansetron (ZOFRAN ODT) 4 MG disintegrating tablet Take 1 tablet (4 mg total) by mouth every 8 (eight) hours as needed for nausea or vomiting. 20 tablet 0  . pantoprazole (PROTONIX) 40 MG tablet Take 1 tablet (40 mg total) by mouth daily. 30 tablet 11  . QUEtiapine (SEROQUEL) 300 MG tablet Take 600 mg by mouth at bedtime.     . topiramate (TOPAMAX) 100 MG tablet Take 100 mg by mouth at bedtime.     . valACYclovir (VALTREX) 500 MG tablet TAKE 1 TABLET (500 MG TOTAL) BY MOUTH DAILY AS NEEDED (FEVER BLISTER). 90 tablet 2   Current Facility-Administered Medications  Medication Dose Route Frequency Provider Last Rate Last Admin  . cyanocobalamin ((VITAMIN B-12)) injection 1,000 mcg  1,000 mcg Intramuscular Q30 days Susy Frizzle, MD   1,000 mcg at 07/25/19 1041    REVIEW OF SYSTEMS:   Constitutional: ( - ) fevers, ( - )  chills , ( - ) night sweats Eyes: ( - ) blurriness of vision, ( - ) double vision, ( - ) watery eyes Ears, nose, mouth, throat, and face: ( - ) mucositis, ( - ) sore throat Respiratory: ( - ) cough, ( - ) dyspnea, ( - ) wheezes Cardiovascular: ( - ) palpitation, ( - ) chest discomfort, ( - ) lower extremity swelling Gastrointestinal:  ( - ) nausea, ( - ) heartburn, ( - ) change in bowel habits Skin: ( - ) abnormal skin rashes Lymphatics: ( - ) new lymphadenopathy, ( - ) easy bruising Neurological: ( - ) numbness, ( - ) tingling, ( - )  new weaknesses Behavioral/Psych: ( - ) mood change, ( - ) new changes  All other systems were reviewed with the patient and are negative.  PHYSICAL EXAMINATION:  Vitals:   11/09/19 0852  BP: 133/69  Pulse: (!) 103  Resp: 18  Temp: 99.5 F (37.5 C)  SpO2: 100%   Filed Weights   11/09/19 0852  Weight: 241 lb 1.6 oz (109.4 kg)    GENERAL: chronically ill appearing middle aged Caucasian female in NAD  SKIN: skin color, texture, turgor are normal, no rashes or significant lesions EYES: conjunctiva are pink and non-injected, sclera clear LUNGS: clear to auscultation and percussion with normal breathing effort HEART: regular rate & rhythm and no murmurs and no lower extremity edema Musculoskeletal: no cyanosis of digits and no clubbing  PSYCH: alert & oriented x 3, fluent speech NEURO: no focal motor/sensory deficits  LABORATORY DATA:  I have reviewed the data as listed CBC Latest Ref Rng & Units 11/09/2019 06/27/2019 06/12/2019  WBC 4.0 - 10.5 K/uL 7.4 5.9 7.1  Hemoglobin 12.0 - 15.0 g/dL 10.0(L) 11.9(L) 12.0  Hematocrit  36 - 46 % 33.5(L) 37.8 38.4  Platelets 150 - 400 K/uL 459(H) 309 271    CMP Latest Ref Rng & Units 06/27/2019 06/12/2019 04/11/2019  Glucose 70 - 99 mg/dL 91 98 90  BUN 6 - 20 mg/dL 6 10 5(L)  Creatinine 0.44 - 1.00 mg/dL 0.75 0.61 0.65  Sodium 135 - 145 mmol/L 138 140 138  Potassium 3.5 - 5.1 mmol/L 3.4(L) 3.6 3.3(L)  Chloride 98 - 111 mmol/L 105 108 103  CO2 22 - 32 mmol/L 26 23 24   Calcium 8.9 - 10.3 mg/dL 8.7(L) 8.8(L) 9.1  Total Protein 6.5 - 8.1 g/dL 7.0 6.9 6.7  Total Bilirubin 0.3 - 1.2 mg/dL 0.4 0.2(L) 0.6  Alkaline Phos 38 - 126 U/L 113 106 116  AST 15 - 41 U/L 16 17 22   ALT 0 - 44 U/L 20 23 30    RADIOGRAPHIC STUDIES: No results found.  ASSESSMENT & PLAN [name] 44 y.o. female with medical history significant for excisional debridement of chronic abdominal wall seroma (treated in 2017 and 2020) with chronic wound vac in place, sleeve  gastrectomy in 2015,  who presents for evaluation of anemia.  After review the labs, review the records, discussion with the patient the findings are most consistent with an iron deficiency anemia secondary to poor GI absorption.  As such I think she would qualify for IV iron therapy.  We will order baseline level iron that he is today and if she is found to be iron deficient we would plan for IV Feraheme 510 mg q. 7 days x 2 doses.  Additionally we will check her for other nutritional deficiencies due to her gastric sleeve.  We will plan to see the patient back approximately 6 weeks after her last dose of IV iron therapy in order to assure that her iron levels and hemoglobin have corrected.  # Iron Deficiency Anemia 2/2 to Poor GI absorption --findings are most consistent with an iron deficiency anemia 2/2 to poor GI absorption. I do not think she would get maximal benefit from PO iron therapy. --will order nutritional labs today to include Vitamin B12, folate, MMA, and homocysteine as well as iron panel and ferritin --in the event she is found to be iron deficient I would recommend IV fereheme 510mg  q7 days x 2 doses.  --RTC 6 weeks after last dose of IV iron to reassess.   Orders Placed This Encounter  Procedures  . CBC with Differential (Cancer Center Only)    Standing Status:   Future    Number of Occurrences:   1    Standing Expiration Date:   11/08/2020  . Retic Panel    Standing Status:   Future    Number of Occurrences:   1    Standing Expiration Date:   11/08/2020  . CMP ([place] only)    Standing Status:   Future    Number of Occurrences:   1    Standing Expiration Date:   11/08/2020  . Iron and TIBC    Standing Status:   Future    Number of Occurrences:   1    Standing Expiration Date:   11/08/2020  . Ferritin    Standing Status:   Future    Number of Occurrences:   1    Standing Expiration Date:   11/08/2020  . Vitamin B12    Standing Status:   Future    Number of  Occurrences:   1    Standing  Expiration Date:   11/08/2020  . Folate, Serum    Standing Status:   Future    Number of Occurrences:   1    Standing Expiration Date:   11/08/2020    All questions were answered. The patient knows to call the clinic with any problems, questions or concerns.  A total of more than 60 minutes were spent on this encounter and over half of that time was spent on counseling and coordination of care as outlined above.   Melissa Peoples, MD Department of Hematology/Oncology [place] at [facility] Phone: [phone] Pager: [phone] Email: [name]  11/09/2019 10:24 AM

## 2019-11-10 ENCOUNTER — Telehealth: Payer: Self-pay | Admitting: Hematology and Oncology

## 2019-11-10 ENCOUNTER — Encounter (HOSPITAL_BASED_OUTPATIENT_CLINIC_OR_DEPARTMENT_OTHER): Payer: Medicare Other | Attending: Internal Medicine | Admitting: Internal Medicine

## 2019-11-10 DIAGNOSIS — M1990 Unspecified osteoarthritis, unspecified site: Secondary | ICD-10-CM | POA: Insufficient documentation

## 2019-11-10 DIAGNOSIS — D869 Sarcoidosis, unspecified: Secondary | ICD-10-CM | POA: Diagnosis not present

## 2019-11-10 DIAGNOSIS — F319 Bipolar disorder, unspecified: Secondary | ICD-10-CM | POA: Diagnosis not present

## 2019-11-10 DIAGNOSIS — L98499 Non-pressure chronic ulcer of skin of other sites with unspecified severity: Secondary | ICD-10-CM | POA: Diagnosis not present

## 2019-11-10 DIAGNOSIS — D649 Anemia, unspecified: Secondary | ICD-10-CM | POA: Diagnosis present

## 2019-11-10 DIAGNOSIS — F209 Schizophrenia, unspecified: Secondary | ICD-10-CM | POA: Diagnosis not present

## 2019-11-10 DIAGNOSIS — F419 Anxiety disorder, unspecified: Secondary | ICD-10-CM | POA: Diagnosis not present

## 2019-11-10 DIAGNOSIS — T8131XA Disruption of external operation (surgical) wound, not elsewhere classified, initial encounter: Secondary | ICD-10-CM | POA: Diagnosis not present

## 2019-11-10 DIAGNOSIS — D509 Iron deficiency anemia, unspecified: Secondary | ICD-10-CM | POA: Diagnosis not present

## 2019-11-10 DIAGNOSIS — K91872 Postprocedural seroma of a digestive system organ or structure following a digestive system procedure: Secondary | ICD-10-CM | POA: Diagnosis not present

## 2019-11-10 NOTE — Telephone Encounter (Signed)
Scheduled per los. Called and left msg. Mailed printout  °

## 2019-11-13 ENCOUNTER — Telehealth: Payer: Self-pay | Admitting: *Deleted

## 2019-11-13 DIAGNOSIS — D509 Iron deficiency anemia, unspecified: Secondary | ICD-10-CM | POA: Diagnosis not present

## 2019-11-13 DIAGNOSIS — F419 Anxiety disorder, unspecified: Secondary | ICD-10-CM | POA: Diagnosis not present

## 2019-11-13 DIAGNOSIS — Z79891 Long term (current) use of opiate analgesic: Secondary | ICD-10-CM | POA: Diagnosis not present

## 2019-11-13 DIAGNOSIS — F209 Schizophrenia, unspecified: Secondary | ICD-10-CM | POA: Diagnosis not present

## 2019-11-13 DIAGNOSIS — R109 Unspecified abdominal pain: Secondary | ICD-10-CM | POA: Diagnosis not present

## 2019-11-13 DIAGNOSIS — R519 Headache, unspecified: Secondary | ICD-10-CM | POA: Diagnosis not present

## 2019-11-13 DIAGNOSIS — I6930 Unspecified sequelae of cerebral infarction: Secondary | ICD-10-CM | POA: Diagnosis not present

## 2019-11-13 DIAGNOSIS — K91872 Postprocedural seroma of a digestive system organ or structure following a digestive system procedure: Secondary | ICD-10-CM | POA: Diagnosis not present

## 2019-11-13 DIAGNOSIS — G8928 Other chronic postprocedural pain: Secondary | ICD-10-CM | POA: Diagnosis not present

## 2019-11-13 DIAGNOSIS — D869 Sarcoidosis, unspecified: Secondary | ICD-10-CM | POA: Diagnosis not present

## 2019-11-13 DIAGNOSIS — F319 Bipolar disorder, unspecified: Secondary | ICD-10-CM | POA: Diagnosis not present

## 2019-11-13 NOTE — Progress Notes (Signed)
[name], [name] ([id number]) Visit Report for 11/10/2019 Arrival Information Details Patient Name: Date of Service: [name], [place] [name] 11/10/2019 2:30 PM Medical Record Number: [id number] Patient Account Number: [id number] Date of Birth/Sex: Treating RN: 01/20/1976 (44 y.o. Helene Shoe, Tammi Klippel Primary Care Eviana Sibilia: Elio Forget RD, Hillery Aldo Other Clinician: Referring Skyler Carel: Treating Unknown Flannigan/Extender: Johnella Moloney RD, [place] RREN Weeks in Treatment: 3 Visit Information History Since Last Visit Added or deleted any medications: No Patient Arrived: Ambulatory Any new allergies or adverse reactions: No Arrival Time: 15:03 Had a fall or experienced change in No Accompanied By: self activities of daily living that may affect Transfer Assistance: None risk of falls: Patient Identification Verified: Yes Signs or symptoms of abuse/neglect since last visito No Secondary Verification Process Completed: Yes Hospitalized since last visit: No Patient Requires Transmission-Based Precautions: No Implantable device outside of the clinic excluding No Patient Has Alerts: No cellular tissue based products placed in the center since last visit: Has Dressing in Place as Prescribed: Yes Pain Present Now: No Electronic Signature(s) Signed: 11/10/2019 5:49:45 PM By: Deon Pilling Entered By: Deon Pilling on 11/10/2019 15:07:34 -------------------------------------------------------------------------------- Clinic Level of Care Assessment Details Patient Name: Date of Service: [name], [place] [name] 11/10/2019 2:30 PM Medical Record Number: [id number] Patient Account Number: [id number] Date of Birth/Sex: Treating RN: November 15, 1975 (44 y.o. Clearnce Sorrel Primary Care Ayse Mccartin: Elio Forget RD, [place] RREN Other Clinician: Referring Tavion Senkbeil: Treating Calisha Tindel/Extender: Johnella Moloney RD, [place] RREN Weeks in Treatment: 3 Clinic Level of Care Assessment Items TOOL 4 Quantity Score X- 1 0 Use when only an  EandM is performed on FOLLOW-UP visit ASSESSMENTS - Nursing Assessment / Reassessment X- 1 10 Reassessment of Co-morbidities (includes updates in patient status) X- 1 5 Reassessment of Adherence to Treatment Plan ASSESSMENTS - Wound and Skin A ssessment / Reassessment X - Simple Wound Assessment / Reassessment - one wound 1 5 []  - 0 Complex Wound Assessment / Reassessment - multiple wounds []  - 0 Dermatologic / Skin Assessment (not related to wound area) ASSESSMENTS - Focused Assessment []  - 0 Circumferential Edema Measurements - multi extremities []  - 0 Nutritional Assessment / Counseling / Intervention []  - 0 Lower Extremity Assessment (monofilament, tuning fork, pulses) []  - 0 Peripheral Arterial Disease Assessment (using hand held doppler) ASSESSMENTS - Ostomy and/or Continence Assessment and Care []  - 0 Incontinence Assessment and Management []  - 0 Ostomy Care Assessment and Management (repouching, etc.) PROCESS - Coordination of Care X - Simple Patient / Family Education for ongoing care 1 15 []  - 0 Complex (extensive) Patient / Family Education for ongoing care X- 1 10 Staff obtains Programmer, systems, Records, T Results / Process Orders est X- 1 10 Staff telephones HHA, Nursing Homes / Clarify orders / etc []  - 0 Routine Transfer to another Facility (non-emergent condition) []  - 0 Routine Hospital Admission (non-emergent condition) []  - 0 New Admissions / Biomedical engineer / Ordering NPWT Apligraf, etc. , []  - 0 Emergency Hospital Admission (emergent condition) X- 1 10 Simple Discharge Coordination []  - 0 Complex (extensive) Discharge Coordination PROCESS - Special Needs []  - 0 Pediatric / Minor Patient Management []  - 0 Isolation Patient Management []  - 0 Hearing / Language / Visual special needs []  - 0 Assessment of Community assistance (transportation, D/C planning, etc.) []  - 0 Additional assistance / Altered mentation []  - 0 Support Surface(s)  Assessment (bed, cushion, seat, etc.) INTERVENTIONS - Wound Cleansing / Measurement X - Simple Wound Cleansing - one wound 1  5 []  - 0 Complex Wound Cleansing - multiple wounds X- 1 5 Wound Imaging (photographs - any number of wounds) []  - 0 Wound Tracing (instead of photographs) X- 1 5 Simple Wound Measurement - one wound []  - 0 Complex Wound Measurement - multiple wounds INTERVENTIONS - Wound Dressings []  - 0 Small Wound Dressing one or multiple wounds X- 1 15 Medium Wound Dressing one or multiple wounds []  - 0 Large Wound Dressing one or multiple wounds X- 1 5 Application of Medications - topical []  - 0 Application of Medications - injection INTERVENTIONS - Miscellaneous []  - 0 External ear exam []  - 0 Specimen Collection (cultures, biopsies, blood, body fluids, etc.) []  - 0 Specimen(s) / Culture(s) sent or taken to Lab for analysis []  - 0 Patient Transfer (multiple staff / Civil Service fast streamer / Similar devices) []  - 0 Simple Staple / Suture removal (25 or less) []  - 0 Complex Staple / Suture removal (26 or more) []  - 0 Hypo / Hyperglycemic Management (close monitor of Blood Glucose) []  - 0 Ankle / Brachial Index (ABI) - do not check if billed separately X- 1 5 Vital Signs Has the patient been seen at the hospital within the last three years: Yes Total Score: 105 Level Of Care: New/Established - Level 3 Electronic Signature(s) Signed: 11/13/2019 1:33:30 PM By: Kela Millin Entered By: Kela Millin on 11/10/2019 15:45:06 -------------------------------------------------------------------------------- Encounter Discharge Information Details Patient Name: Date of Service: Melissa Spray, [place] [name] 11/10/2019 2:30 PM Medical Record Number: [id number] Patient Account Number: [id number] Date of Birth/Sex: Treating RN: [birth year] (44 y.o. Debby Bud Primary Care Toris Laverdiere: [place], Hillery Aldo Other Clinician: Referring Cardelia Sassano: Treating Virat Prather/Extender: Johnella Moloney RD, [place] RREN Weeks in Treatment: 3 Encounter Discharge Information Items Discharge Condition: Stable Ambulatory Status: Ambulatory Discharge Destination: Home Transportation: Private Auto Accompanied By: self Schedule Follow-up Appointment: Yes Clinical Summary of Care: Electronic Signature(s) Signed: 11/10/2019 5:49:45 PM By: Deon Pilling Entered By: Deon Pilling on 11/10/2019 16:35:55 -------------------------------------------------------------------------------- Lower Extremity Assessment Details Patient Name: Date of Service: Melissa Spray, [place] [name] 11/10/2019 2:30 PM Medical Record Number: [id number] Patient Account Number: [id number] Date of Birth/Sex: Treating RN: 1975/03/08 (44 y.o. Debby Bud Primary Care Yatziry Deakins: [place], Hillery Aldo Other Clinician: Referring Nataleigh Griffin: Treating Areeb Corron/Extender: Johnella Moloney RD, [place] RREN Weeks in Treatment: 3 Electronic Signature(s) Signed: 11/10/2019 5:49:45 PM By: Deon Pilling Entered By: Deon Pilling on 11/10/2019 15:08:10 -------------------------------------------------------------------------------- Multi Wound Chart Details Patient Name: Date of Service: Melissa Spray, [place] [name] 11/10/2019 2:30 PM Medical Record Number: [id number] Patient Account Number: [id number] Date of Birth/Sex: Treating RN: 07-Jun-1975 (44 y.o. Clearnce Sorrel Primary Care Denasia Venn: Elio Forget RD, [place] RREN Other Clinician: Referring Mckinzey Entwistle: Treating Ryan Palermo/Extender: Johnella Moloney RD, [place] RREN Weeks in Treatment: 3 Vital Signs Height(in): 66 Pulse(bpm): 98 Weight(lbs): 235 Blood Pressure(mmHg): 132/89 Body Mass Index(BMI): 38 Temperature(F): 98.3 Respiratory Rate(breaths/min): 20 Photos: [1:No Photos Abdomen - midline] [N/A:N/A N/A] Wound Location: [1:Surgical Injury] [N/A:N/A] Wounding Event: [1:Open Surgical Wound] [N/A:N/A] Primary Etiology: [1:Anemia, Hypertension, Osteoarthritis,] [N/A:N/A] Comorbid History:  [1:Confinement Anxiety 06/29/2019] [N/A:N/A] Date Acquired: [1:3] [N/A:N/A] Weeks of Treatment: [1:Open] [N/A:N/A] Wound Status: [1:3.5x1.8x5.1] [N/A:N/A] Measurements L x W x D (cm) [1:4.948] [N/A:N/A] A (cm) : rea [1:25.235] [N/A:N/A] Volume (cm) : [1:65.40%] [N/A:N/A] % Reduction in A rea: [1:69.60%] [N/A:N/A] % Reduction in Volume: [1:8] Starting Position 1 (o'clock): [1:4] Ending Position 1 (o'clock): [1:9] Maximum Distance 1 (cm): [1:Yes] [N/A:N/A] Undermining: [1:Full Thickness With Exposed Support] [N/A:N/A]  Classification: [1:Structures Large] [N/A:N/A] Exudate Amount: [1:Serosanguineous] [N/A:N/A] Exudate Type: [1:red, brown] [N/A:N/A] Exudate Color: [1:Yes] [N/A:N/A] Foul Odor A Cleansing: [1:fter No] [N/A:N/A] Odor Anticipated Due to Product Use: [1:Well defined, not attached] [N/A:N/A] Wound Margin: [1:Large (67-100%)] [N/A:N/A] Granulation A mount: [1:Red] [N/A:N/A] Granulation Quality: [1:None Present (0%)] [N/A:N/A] Necrotic Amount: [1:Fat Layer (Subcutaneous Tissue): Yes N/A] Exposed Structures: [1:Muscle: Yes Fascia: No Tendon: No Joint: No Bone: No None] [N/A:N/A] Treatment Notes Wound #1 (Abdomen - midline) 1. Cleanse With Wound Cleanser 3. Primary Dressing Applied Other primary dressing (specifiy in notes) 4. Secondary Dressing ABD Pad Notes wet to dry kerlix packing. HH to apply wound vac Electronic Signature(s) Signed: 11/13/2019 8:08:43 AM By: Linton Ham MD Signed: 11/13/2019 1:33:30 PM By: Kela Millin Entered By: Linton Ham on 11/13/2019 05:03:40 -------------------------------------------------------------------------------- Multi-Disciplinary Care Plan Details Patient Name: Date of Service: Melissa Spray, [place] [name] 11/10/2019 2:30 PM Medical Record Number: [id number] Patient Account Number: [id number] Date of Birth/Sex: Treating RN: [birth year] (44 y.o. Clearnce Sorrel Primary Care Butler Vegh: Elio Forget RD, Hillery Aldo Other  Clinician: Referring Jabari Swoveland: Treating Atleigh Gruen/Extender: Johnella Moloney RD, [place] RREN Weeks in Treatment: 3 Active Inactive Nutrition Nursing Diagnoses: Potential for alteratiion in Nutrition/Potential for imbalanced nutrition Goals: Patient/caregiver agrees to and verbalizes understanding of need to use nutritional supplements and/or vitamins as prescribed Date Initiated: 10/16/2019 Target Resolution Date: 11/17/2019 Goal Status: Active Interventions: Assess patient nutrition upon admission and as needed per policy Provide education on nutrition Treatment Activities: Education provided on Nutrition : 10/16/2019 Notes: Wound/Skin Impairment Nursing Diagnoses: Impaired tissue integrity Knowledge deficit related to ulceration/compromised skin integrity Goals: Patient/caregiver will verbalize understanding of skin care regimen Date Initiated: 10/16/2019 Target Resolution Date: 11/17/2019 Goal Status: Active Ulcer/skin breakdown will have a volume reduction of 30% by week 4 Date Initiated: 10/16/2019 Target Resolution Date: 11/17/2019 Goal Status: Active Interventions: Assess patient/caregiver ability to obtain necessary supplies Assess patient/caregiver ability to perform ulcer/skin care regimen upon admission and as needed Assess ulceration(s) every visit Provide education on ulcer and skin care Notes: Electronic Signature(s) Signed: 11/13/2019 1:33:30 PM By: Kela Millin Entered By: Kela Millin on 11/10/2019 15:44:21 -------------------------------------------------------------------------------- Pain Assessment Details Patient Name: Date of Service: Melissa Spray, [place] [name] 11/10/2019 2:30 PM Medical Record Number: [id number] Patient Account Number: [id number] Date of Birth/Sex: Treating RN: [birth year] (44 y.o. Debby Bud Primary Care Jeanette Moffatt: [place], Hillery Aldo Other Clinician: Referring Kimyata Milich: Treating Jorell Agne/Extender: Johnella Moloney RD,  [place] RREN Weeks in Treatment: 3 Active Problems Location of Pain Severity and Description of Pain Patient Has Paino No Site Locations Rate the pain. Current Pain Level: 0 Pain Management and Medication Current Pain Management: Medication: No Cold Application: No Rest: No Massage: No Activity: No T.E.N.S.: No Heat Application: No Leg drop or elevation: No Is the Current Pain Management Adequate: Adequate How does your wound impact your activities of daily livingo Sleep: No Bathing: No Appetite: No Relationship With Others: No Bladder Continence: No Emotions: No Bowel Continence: No Work: No Toileting: No Drive: No Dressing: No Hobbies: No Electronic Signature(s) Signed: 11/10/2019 5:49:45 PM By: Deon Pilling Entered By: Deon Pilling on 11/10/2019 15:08:04 -------------------------------------------------------------------------------- Patient/Caregiver Education Details Patient Name: Date of Service: Melissa Spray, [place] [name] 9/17/2021andnbsp2:30 PM Medical Record Number: [id number] Patient Account Number: [id number] Date of Birth/Gender: Treating RN: February 04, 1976 (44 y.o. Clearnce Sorrel Primary Care Physician: [place], [place] Other Clinician: Referring Physician: Treating Physician/Extender: Johnella Moloney RD, [place] RREN Weeks in Treatment: 3 Education Assessment Education  Provided To: Patient Education Topics Provided Wound/Skin Impairment: Handouts: Caring for Your Ulcer Methods: Explain/Verbal Responses: State content correctly Electronic Signature(s) Signed: 11/13/2019 1:33:30 PM By: Kela Millin Entered By: Kela Millin on 11/10/2019 15:44:34 -------------------------------------------------------------------------------- Wound Assessment Details Patient Name: Date of Service: Melissa Spray, [place] [name] 11/10/2019 2:30 PM Medical Record Number: [id number] Patient Account Number: [id number] Date of Birth/Sex: Treating RN: 17-Apr-1975 (44 y.o.  Debby Bud Primary Care Yaeli Hartung: Elio Forget RD, [place] RREN Other Clinician: Referring Sherrie Marsan: Treating Monico Sudduth/Extender: Johnella Moloney RD, [place] RREN Weeks in Treatment: 3 Wound Status Wound Number: 1 Primary Etiology: Open Surgical Wound Wound Location: Abdomen - midline Wound Status: Open Wounding Event: Surgical Injury Comorbid History: Anemia, Hypertension, Osteoarthritis, Confinement Anxiety Date Acquired: 06/29/2019 Weeks Of Treatment: 3 Clustered Wound: No Wound Measurements Length: (cm) 3.5 Width: (cm) 1.8 Depth: (cm) 5.1 Area: (cm) 4.948 Volume: (cm) 25.235 % Reduction in Area: 65.4% % Reduction in Volume: 69.6% Epithelialization: None Tunneling: No Undermining: Yes Starting Position (o'clock): 8 Ending Position (o'clock): 4 Maximum Distance: (cm) 9 Wound Description Classification: Full Thickness With Exposed Support Structures Wound Margin: Well defined, not attached Exudate Amount: Large Exudate Type: Serosanguineous Exudate Color: red, brown Foul Odor After Cleansing: Yes Due to Product Use: No Slough/Fibrino No Wound Bed Granulation Amount: Large (67-100%) Exposed Structure Granulation Quality: Red Fascia Exposed: No Necrotic Amount: None Present (0%) Fat Layer (Subcutaneous Tissue) Exposed: Yes Tendon Exposed: No Muscle Exposed: Yes Necrosis of Muscle: No Joint Exposed: No Bone Exposed: No Treatment Notes Wound #1 (Abdomen - midline) 1. Cleanse With Wound Cleanser 3. Primary Dressing Applied Other primary dressing (specifiy in notes) 4. Secondary Dressing ABD Pad Notes wet to dry kerlix packing. HH to apply wound vac Electronic Signature(s) Signed: 11/10/2019 5:49:45 PM By: Deon Pilling Entered By: Deon Pilling on 11/10/2019 15:14:39 -------------------------------------------------------------------------------- Vitals Details Patient Name: Date of Service: Melissa Spray, [place] [name] 11/10/2019 2:30 PM Medical Record Number:  [id number] Patient Account Number: [id number] Date of Birth/Sex: Treating RN: 09/14/1975 (44 y.o. Debby Bud Primary Care Rhianne Soman: [place], [place] RREN Other Clinician: Referring Ketrina Boateng: Treating Laquana Villari/Extender: Johnella Moloney RD, [place] RREN Weeks in Treatment: 3 Vital Signs Time Taken: 15:05 Temperature (F): 98.3 Height (in): 66 Pulse (bpm): 98 Weight (lbs): 235 Respiratory Rate (breaths/min): 20 Body Mass Index (BMI): 37.9 Blood Pressure (mmHg): 132/89 Reference Range: 80 - 120 mg / dl Electronic Signature(s) Signed: 11/10/2019 5:49:45 PM By: Deon Pilling Entered By: Deon Pilling on 11/10/2019 15:07:53

## 2019-11-13 NOTE — Telephone Encounter (Signed)
-----   Message from Orson Slick, MD sent at 11/12/2019  2:35 PM EDT ----- Please let Mrs. Bautch know that her lab findings are most consistent with continued iron deficiency anemia. I think she may have trouble absorbing iron and would likely benefit from IV therapy. She is currently scheduled for two doses of iron, with the next being this week.  ----- Message ----- From: Buel Ream, Lab In [place] Sent: 11/09/2019  10:07 AM EDT To: Orson Slick, MD

## 2019-11-13 NOTE — Progress Notes (Signed)
[name], [name] ([id number]) Visit Report for 11/10/2019 HPI Details Patient Name: Date of Service: [name], [place] [name] 11/10/2019 2:30 PM Medical Record Number: [id number] Patient Account Number: [id number] Date of Birth/Sex: Treating RN: 05-Mar-1975 (44 y.o. Clearnce Sorrel Primary Care Provider: Elio Forget RD, Hillery Aldo Other Clinician: Referring Provider: Treating Provider/Extender: Johnella Moloney RD, [place] RREN Weeks in Treatment: 3 History of Present Illness HPI Description: ADMISSION 10/16/2019 This is a 44 year old woman who apparently had a perforated diverticulum in the mid 2000's. She required a colostomy and then reversal. She did well up until 2009 she required an incisional hernia repair at an outside hospital. She had a gastric sleeve procedure as well in 2015. Sometime after this she developed an abdominal seroma. She had abdominal drains as initial management. She had drainage of the seroma cavity in 2016 in the OR. This resulted in resolution of the seroma cavity for a long period of time. She represented in December of last year year with a large amount of fluid. Her last CT scan was in April noted that she had a small residual fluid collection. However because of continued drainage it was elected that she would go ahead with a surgical procedure for debridement of the seroma cavity. She was taken to the OR on Jun 29, 2019. The measurements of the area were 15 x 15 x 3 at that point. A wound VAC was placed postoperatively. She tells me she loses a lot of blood in the wound VAC and she is iron deficient. Last hemoglobin I see was 11.9 on 06/27/2019. Her albumin was normal. The patient comes in the clinic here very frustrated with the lack of progress. She says the wound is not healed at all with the wound VAC. She continues to have what she describes as a large amount of sanguinous drainage and she changes the canister every day. She is not having any pain or fever. She  is wondering what her options are given the fact that she believes the wound VAC to be ineffective. Past medical history includes bipolar disorder, sarcoidosis, TIA, perforated diverticulum in the mid 2000's, incisional hernia repair in 2009, gastric sleeve procedure in 2015, hypertension 10/23/2019; there has been some improvement in her dimensions. She still changes the canister every 30 hours. I have not cultured this but there is an odor. Current dimensions are 5.5 x 2.5 x 4.2 with 6.5 cm of maximum undermining superiorly. 9/17; 2 week follow-up. The patient is using a wound VAC. She is able to show me her sanguinous drainage in the canister. She also reports that when the wound VAC is off for any period of time she develops fever up to 100. Sodium note she was referred to hematology for chronic anemia. It was felt that this is iron deficiency probably secondary to poor iron absorption. Her hemoglobin was measured at 10 with low MCH and MCHC at 25.2 with 29.9 respectively. Platelets slightly up at 459 iron was 16, TIBC at 254. Saturation 6 ferritin at 63 B12 287 Electronic Signature(s) Signed: 11/13/2019 8:08:43 AM By: Linton Ham MD Entered By: Linton Ham on 11/13/2019 05:07:47 -------------------------------------------------------------------------------- Physical Exam Details Patient Name: Date of Service: Melissa Spray, [place] [name] 11/10/2019 2:30 PM Medical Record Number: [id number] Patient Account Number: [id number] Date of Birth/Sex: Treating RN: 05-Feb-1976 (44 y.o. Clearnce Sorrel Primary Care Provider: [place], Hillery Aldo Other Clinician: Referring Provider: Treating Provider/Extender: Johnella Moloney RD, [place] RREN Weeks in Treatment: 3 Constitutional Sitting  or standing Blood Pressure is within target range for patient.. Pulse regular and within target range for patient.Marland Kitchen Respirations regular, non-labored and within target range.. Temperature is normal and within the  target range for the patient.Marland Kitchen Appears in no distress. Gastrointestinal (GI) abdominal wound noted other than that no periwound tenderness. No masses. No liver or spleen enlargement. no obvious abdominal wall herniation. Notes wound exam; the patient's orifices come down somewhat. Also the walls of the wound appeared to have healthy granulation over the base of this still has the same amount of undermining in depth as last time. This goes down to the fascial level. Notable for the fact that there is absolutely no bleeding that is obvious no palpable tenderness. Electronic Signature(s) Signed: 11/13/2019 8:08:43 AM By: Linton Ham MD Entered By: Linton Ham on 11/13/2019 05:10:12 -------------------------------------------------------------------------------- Physician Orders Details Patient Name: Date of Service: Melissa Spray, [place] [name] 11/10/2019 2:30 PM Medical Record Number: [id number] Patient Account Number: [id number] Date of Birth/Sex: Treating RN: 1975-12-01 (44 y.o. Clearnce Sorrel Primary Care Provider: [place], Hillery Aldo Other Clinician: Referring Provider: Treating Provider/Extender: Johnella Moloney RD, [place] RREN Weeks in Treatment: 3 Verbal / Phone Orders: No Diagnosis Coding ICD-10 Coding Code Description T81.31XA Disruption of external operation (surgical) wound, not elsewhere classified, initial encounter K91.873 Postprocedural seroma of a digestive system organ or structure following other procedure S31.105S Unspecified open wound of abdominal wall, periumbilic region without penetration into peritoneal cavity, sequela Follow-up Appointments Return Appointment in 2 weeks. Dressing Change Frequency Wound #1 Abdomen - midline Change dressing three times week. - by home health Skin Barriers/Peri-Wound Care Wound #1 Abdomen - midline Skin Prep Wound Cleansing Wound #1 Abdomen - midline Clean wound with Wound Cleanser - or normal saline Primary Wound  Dressing Wound #1 Abdomen - midline Other: - wet-to-dry today in clinic, home health to apply wound vac Negative Presssure Wound Therapy Wound #1 Abdomen - midline Wound Vac to wound continuously at 13mm/hg pressure Black and White Foam combination - white foam in undermining/tunneling areas [facility] skilled nursing for wound care. - Advanced Electronic Signature(s) Signed: 11/13/2019 8:08:43 AM By: Linton Ham MD Signed: 11/13/2019 1:33:30 PM By: Kela Millin Entered By: Kela Millin on 11/10/2019 15:44:10 -------------------------------------------------------------------------------- Problem List Details Patient Name: Date of Service: Melissa Spray, MA [name] 11/10/2019 2:30 PM Medical Record Number: [id number] Patient Account Number: [id number] Date of Birth/Sex: Treating RN: 1976-01-02 (44 y.o. Clearnce Sorrel Primary Care Provider: Elio Forget RD, Hillery Aldo Other Clinician: Referring Provider: Treating Provider/Extender: Johnella Moloney RD, [place] RREN Weeks in Treatment: 3 Active Problems ICD-10 Encounter Code Description Active Date MDM Diagnosis T81.31XA Disruption of external operation (surgical) wound, not elsewhere classified, 10/16/2019 No Yes initial encounter K91.873 Postprocedural seroma of a digestive system organ or structure following 10/16/2019 No Yes other procedure S31.105S Unspecified open wound of abdominal wall, periumbilic region without 08/24/7791 No Yes penetration into peritoneal cavity, sequela Inactive Problems Resolved Problems Electronic Signature(s) Signed: 11/13/2019 8:08:43 AM By: Linton Ham MD Entered By: Linton Ham on 11/13/2019 04:58:17 -------------------------------------------------------------------------------- Progress Note Details Patient Name: Date of Service: Melissa Spray, [place] [name] 11/10/2019 2:30 PM Medical Record Number: [id number] Patient Account Number: [id number] Date of  Birth/Sex: Treating RN: 1975/12/08 (44 y.o. Clearnce Sorrel Primary Care Provider: Elio Forget RD, Hillery Aldo Other Clinician: Referring Provider: Treating Provider/Extender: Johnella Moloney RD, [place] RREN Weeks in Treatment: 3 Subjective History of Present Illness (HPI) ADMISSION 10/16/2019 This is a 44 year old  woman who apparently had a perforated diverticulum in the mid 2000's. She required a colostomy and then reversal. She did well up until 2009 she required an incisional hernia repair at an outside hospital. She had a gastric sleeve procedure as well in 2015. Sometime after this she developed an abdominal seroma. She had abdominal drains as initial management. She had drainage of the seroma cavity in 2016 in the OR. This resulted in resolution of the seroma cavity for a long period of time. She represented in December of last year year with a large amount of fluid. Her last CT scan was in April noted that she had a small residual fluid collection. However because of continued drainage it was elected that she would go ahead with a surgical procedure for debridement of the seroma cavity. She was taken to the OR on Jun 29, 2019. The measurements of the area were 15 x 15 x 3 at that point. A wound VAC was placed postoperatively. She tells me she loses a lot of blood in the wound VAC and she is iron deficient. Last hemoglobin I see was 11.9 on 06/27/2019. Her albumin was normal. The patient comes in the clinic here very frustrated with the lack of progress. She says the wound is not healed at all with the wound VAC. She continues to have what she describes as a large amount of sanguinous drainage and she changes the canister every day. She is not having any pain or fever. She is wondering what her options are given the fact that she believes the wound VAC to be ineffective. Past medical history includes bipolar disorder, sarcoidosis, TIA, perforated diverticulum in the mid 2000's, incisional  hernia repair in 2009, gastric sleeve procedure in 2015, hypertension 10/23/2019; there has been some improvement in her dimensions. She still changes the canister every 30 hours. I have not cultured this but there is an odor. Current dimensions are 5.5 x 2.5 x 4.2 with 6.5 cm of maximum undermining superiorly. 9/17; 2 week follow-up. The patient is using a wound VAC. She is able to show me her sanguinous drainage in the canister. She also reports that when the wound VAC is off for any period of time she develops fever up to 100. Sodium note she was referred to hematology for chronic anemia. It was felt that this is iron deficiency probably secondary to poor iron absorption. Her hemoglobin was measured at 10 with low MCH and MCHC at 25.2 with 29.9 respectively. Platelets slightly up at 459 iron was 16, TIBC at 254. Saturation 6 ferritin at 63 B12 287 Objective Constitutional Sitting or standing Blood Pressure is within target range for patient.. Pulse regular and within target range for patient.Marland Kitchen Respirations regular, non-labored and within target range.. Temperature is normal and within the target range for the patient.Marland Kitchen Appears in no distress. Vitals Time Taken: 3:05 PM, Height: 66 in, Weight: 235 lbs, BMI: 37.9, Temperature: 98.3 F, Pulse: 98 bpm, Respiratory Rate: 20 breaths/min, Blood Pressure: 132/89 mmHg. Gastrointestinal (GI) abdominal wound noted other than that no periwound tenderness. No masses. No liver or spleen enlargement. no obvious abdominal wall herniation. General Notes: wound exam; the patient's orifices come down somewhat. Also the walls of the wound appeared to have healthy granulation over the base of this still has the same amount of undermining in depth as last time. This goes down to the fascial level. Notable for the fact that there is absolutely no bleeding that is obvious no palpable tenderness. Integumentary (Hair, Skin)  Wound #1 status is Open. Original cause of  wound was Surgical Injury. The wound is located on the Abdomen - midline. The wound measures 3.5cm length x 1.8cm width x 5.1cm depth; 4.948cm^2 area and 25.235cm^3 volume. There is muscle and Fat Layer (Subcutaneous Tissue) exposed. There is no tunneling noted, however, there is undermining starting at 8:00 and ending at 4:00 with a maximum distance of 9cm. There is a large amount of serosanguineous drainage noted. Foul odor after cleansing was noted. The wound margin is well defined and not attached to the wound base. There is large (67-100%) red granulation within the wound bed. There is no necrotic tissue within the wound bed. Assessment Active Problems ICD-10 Disruption of external operation (surgical) wound, not elsewhere classified, initial encounter Postprocedural seroma of a digestive system organ or structure following other procedure Unspecified open wound of abdominal wall, periumbilic region without penetration into peritoneal cavity, sequela Plan Follow-up Appointments: Return Appointment in 2 weeks. Dressing Change Frequency: Wound #1 Abdomen - midline: Change dressing three times week. - by home health Skin Barriers/Peri-Wound Care: Wound #1 Abdomen - midline: Skin Prep Wound Cleansing: Wound #1 Abdomen - midline: Clean wound with Wound Cleanser - or normal saline Primary Wound Dressing: Wound #1 Abdomen - midline: Other: - wet-to-dry today in clinic, home health to apply wound vac Negative Presssure Wound Therapy: Wound #1 Abdomen - midline: Wound Vac to wound continuously at 148mm/hg pressure Black and White Foam combination - white foam in undermining/tunneling areas Home Health: [facility] skilled nursing for wound care. - Advanced #1 minimal improvement in the abdominal surgical wound from recurrent illness. Last surgical procedure in May. [Dr. Wilson]. #2 I had wanted to ensure that she was consistently using the wound VAC before we used the cleared  this a treatment failure as prior to her coming here it didn't seem that this isn't been on reliably. I think we've laid that issue to rest, she appears to be using the wound VAC reliably with so far minimal improvement in dimensions but certainly I think the trial here is not overt #3 I have reviewed [place] note she has been seeing Dr. Lorenso Courier of hematology. Her recent lab work certainly suggests iron deficiency and she is scheduled for iron infusions. Although I have seen chronic bleeding with a wound VAC contributed to blood loss anemia it is usually in the setting where bleeding is more obvious even when the wound VAC is not in place. This is not the case here. I think she is felt to be malabsorption iron #4 we continue the wound VAC for now. I would really like to have at least a two-month trial here she is basically a month into this. #5 I would also like her to follow up with general surgery. The issue of the fever she describes extremely wonder whether she needs additional imaging here. I would like this to be done in conjunction with her surgeon. Also the issue whether she would have any surgical options for closure of this I spent 35 minutes in review of this patient's records, a evaluation and preparation this were Electronic Signature(s) Signed: 11/13/2019 8:08:43 AM By: Linton Ham MD Entered By: Linton Ham on 11/13/2019 05:16:53 -------------------------------------------------------------------------------- SuperBill Details Patient Name: Date of Service: Melissa Spray, [place] [name] 11/10/2019 Medical Record Number: [id number] Patient Account Number: [id number] Date of Birth/Sex: Treating RN: 07/25/1975 (44 y.o. Clearnce Sorrel Primary Care Provider: Elio Forget RD, Hillery Aldo Other Clinician: Referring Provider: Treating Provider/Extender:  Johnella Moloney RD, [place] RREN Weeks in Treatment: 3 Diagnosis Coding ICD-10 Codes Code Description T81.31XA Disruption of  external operation (surgical) wound, not elsewhere classified, initial encounter K91.873 Postprocedural seroma of a digestive system organ or structure following other procedure S31.105S Unspecified open wound of abdominal wall, periumbilic region without penetration into peritoneal cavity, sequela D50.8 Other iron deficiency anemias Facility Procedures CPT4 Code: [id number] Description: 99213 - WOUND CARE VISIT-LEV 3 EST PT Modifier: Quantity: 1 Physician Procedures : CPT4 Code Description Modifier [id number] 99214 - WC PHYS LEVEL 4 - EST PT ICD-10 Diagnosis Description T81.31XA Disruption of external operation (surgical) wound, not elsewhere classified, initial encounter K91.873 Postprocedural seroma of a digestive  system organ or structure following other procedure S31.105S Unspecified open wound of abdominal wall, periumbilic region without penetration into peritoneal cavi D50.8 Other iron deficiency anemias Quantity: 1 ty, sequela Electronic Signature(s) Signed: 11/13/2019 8:08:43 AM By: Linton Ham MD Entered By: Linton Ham on 11/13/2019 05:15:37

## 2019-11-13 NOTE — Telephone Encounter (Signed)
TCT patient regarding lab results.  No answer but was able to leave vm message for pt return this call to [phone].

## 2019-11-14 ENCOUNTER — Telehealth: Payer: Self-pay

## 2019-11-14 NOTE — Telephone Encounter (Signed)
Returned patient's phone call question lab results and two scheduled appointment's . Gave patient's lab results per Dr Libby Maw note. Advised patient if she had an futher question to please call the office. Patient verbalized understanding.

## 2019-11-15 DIAGNOSIS — F209 Schizophrenia, unspecified: Secondary | ICD-10-CM | POA: Diagnosis not present

## 2019-11-15 DIAGNOSIS — D869 Sarcoidosis, unspecified: Secondary | ICD-10-CM | POA: Diagnosis not present

## 2019-11-15 DIAGNOSIS — F319 Bipolar disorder, unspecified: Secondary | ICD-10-CM | POA: Diagnosis not present

## 2019-11-15 DIAGNOSIS — F419 Anxiety disorder, unspecified: Secondary | ICD-10-CM | POA: Diagnosis not present

## 2019-11-15 DIAGNOSIS — D509 Iron deficiency anemia, unspecified: Secondary | ICD-10-CM | POA: Diagnosis not present

## 2019-11-15 DIAGNOSIS — K91872 Postprocedural seroma of a digestive system organ or structure following a digestive system procedure: Secondary | ICD-10-CM | POA: Diagnosis not present

## 2019-11-16 ENCOUNTER — Inpatient Hospital Stay: Payer: Medicare Other

## 2019-11-16 ENCOUNTER — Other Ambulatory Visit: Payer: Self-pay

## 2019-11-16 VITALS — BP 116/69 | HR 93 | Temp 98.9°F | Resp 18

## 2019-11-16 DIAGNOSIS — D509 Iron deficiency anemia, unspecified: Secondary | ICD-10-CM | POA: Diagnosis not present

## 2019-11-16 DIAGNOSIS — D500 Iron deficiency anemia secondary to blood loss (chronic): Secondary | ICD-10-CM

## 2019-11-16 DIAGNOSIS — Z23 Encounter for immunization: Secondary | ICD-10-CM | POA: Diagnosis not present

## 2019-11-16 DIAGNOSIS — K9089 Other intestinal malabsorption: Secondary | ICD-10-CM | POA: Diagnosis not present

## 2019-11-16 MED ORDER — SODIUM CHLORIDE 0.9 % IV SOLN
Freq: Once | INTRAVENOUS | Status: AC
  Filled 2019-11-16: qty 250

## 2019-11-16 MED ORDER — SODIUM CHLORIDE 0.9 % IV SOLN
510.0000 mg | Freq: Once | INTRAVENOUS | Status: AC
  Administered 2019-11-16: 510 mg via INTRAVENOUS
  Filled 2019-11-16: qty 510

## 2019-11-16 NOTE — Patient Instructions (Signed)

## 2019-11-17 DIAGNOSIS — D869 Sarcoidosis, unspecified: Secondary | ICD-10-CM | POA: Diagnosis not present

## 2019-11-17 DIAGNOSIS — F419 Anxiety disorder, unspecified: Secondary | ICD-10-CM | POA: Diagnosis not present

## 2019-11-17 DIAGNOSIS — F209 Schizophrenia, unspecified: Secondary | ICD-10-CM | POA: Diagnosis not present

## 2019-11-17 DIAGNOSIS — D509 Iron deficiency anemia, unspecified: Secondary | ICD-10-CM | POA: Diagnosis not present

## 2019-11-17 DIAGNOSIS — K91872 Postprocedural seroma of a digestive system organ or structure following a digestive system procedure: Secondary | ICD-10-CM | POA: Diagnosis not present

## 2019-11-17 DIAGNOSIS — F319 Bipolar disorder, unspecified: Secondary | ICD-10-CM | POA: Diagnosis not present

## 2019-11-20 DIAGNOSIS — D509 Iron deficiency anemia, unspecified: Secondary | ICD-10-CM | POA: Diagnosis not present

## 2019-11-20 DIAGNOSIS — F319 Bipolar disorder, unspecified: Secondary | ICD-10-CM | POA: Diagnosis not present

## 2019-11-20 DIAGNOSIS — F419 Anxiety disorder, unspecified: Secondary | ICD-10-CM | POA: Diagnosis not present

## 2019-11-20 DIAGNOSIS — F209 Schizophrenia, unspecified: Secondary | ICD-10-CM | POA: Diagnosis not present

## 2019-11-20 DIAGNOSIS — K91872 Postprocedural seroma of a digestive system organ or structure following a digestive system procedure: Secondary | ICD-10-CM | POA: Diagnosis not present

## 2019-11-20 DIAGNOSIS — D869 Sarcoidosis, unspecified: Secondary | ICD-10-CM | POA: Diagnosis not present

## 2019-11-22 ENCOUNTER — Telehealth: Payer: Self-pay | Admitting: Emergency Medicine

## 2019-11-22 DIAGNOSIS — D869 Sarcoidosis, unspecified: Secondary | ICD-10-CM | POA: Diagnosis not present

## 2019-11-22 DIAGNOSIS — F319 Bipolar disorder, unspecified: Secondary | ICD-10-CM | POA: Diagnosis not present

## 2019-11-22 DIAGNOSIS — F419 Anxiety disorder, unspecified: Secondary | ICD-10-CM | POA: Diagnosis not present

## 2019-11-22 DIAGNOSIS — K91872 Postprocedural seroma of a digestive system organ or structure following a digestive system procedure: Secondary | ICD-10-CM | POA: Diagnosis not present

## 2019-11-22 DIAGNOSIS — D509 Iron deficiency anemia, unspecified: Secondary | ICD-10-CM | POA: Diagnosis not present

## 2019-11-22 DIAGNOSIS — F209 Schizophrenia, unspecified: Secondary | ICD-10-CM | POA: Diagnosis not present

## 2019-11-22 MED ORDER — PREDNISONE 10 MG PO TABS: 30.0000 mg | ORAL_TABLET | Freq: Every day | ORAL | 0 refills | Status: AC

## 2019-11-22 NOTE — Telephone Encounter (Signed)
Primary Pulmonologist: Dr.Byrum Last office visit and with whom: 09/08/19 Dr.Byrum What do we see them for (pulmonary problems):Sarcoidosis  Last OV assessment/plan:  Assessment & Plan:  Sarcoidosis Clinically stable following flare in March that was treated with prednisone.  She is not having dyspnea, cough at this time.  She is anxious, having some signs of her bipolar.  Certainly would like to avoid prednisone if possible given potential side effects.  I would like to repeat her CT scan of the chest to ensure stability of her lymphadenopathy, pulmonary nodule disease.  She does not want to do this right now, I think it is reasonable to wait until the 56-month point given her overall stability.  This would be January 2022.  I will get a chest x-ray today to ensure no new infiltrates.  We will repeat your chest x-ray today. We will plan to repeat your CT scan of the chest in the future to ensure that your sarcoidosis is stable.  We will probably do this in January 2022.  If you have any changes in your breathing, cough, any new symptoms we may decide to do so sooner. Follow with Dr Lamonte Sakai in 6 months to review your CT scan, or sooner if you have any problems  Baltazar Apo, MD, PhD 09/08/2019, 12:33 PM  [facility] [phone] or if no answer [phone]     Assessment & Plan Note by Collene Gobble, MD at 09/08/2019 12:32 PM Author: Collene Gobble, MD Author Type: Physician Filed: 09/08/2019 12:33 PM  Note Status: Written Cosign: Cosign Not Required Encounter Date: 09/08/2019  Problem: Sarcoidosis  Editor: Collene Gobble, MD (Physician)               Clinically stable following flare in March that was treated with prednisone.  She is not having dyspnea, cough at this time.  She is anxious, having some signs of her bipolar.  Certainly would like to avoid prednisone if possible given potential side effects.  I would like to repeat her CT scan of the chest to ensure  stability of her lymphadenopathy, pulmonary nodule disease.  She does not want to do this right now, I think it is reasonable to wait until the 12-month point given her overall stability.  This would be January 2022.  I will get a chest x-ray today to ensure no new infiltrates.  We will repeat your chest x-ray today. We will plan to repeat your CT scan of the chest in the future to ensure that your sarcoidosis is stable.  We will probably do this in January 2022.  If you have any changes in your breathing, cough, any new symptoms we may decide to do so sooner. Follow with Dr Lamonte Sakai in 6 months to review your CT scan, or sooner if you have any problems    Patient Instructions by Collene Gobble, MD at 09/08/2019 12:00 PM Author: Collene Gobble, MD Author Type: Physician Filed: 09/08/2019 12:31 PM  Note Status: Signed Cosign: Cosign Not Required Encounter Date: 09/08/2019  Editor: Collene Gobble, MD (Physician)               We will repeat your chest x-ray today. We will plan to repeat your CT scan of the chest in the future to ensure that your sarcoidosis is stable.  We will probably do this in January 2022.  If you have any changes in your breathing, cough, any new symptoms we may decide to do so sooner.  Follow with Dr Lamonte Sakai in 6 months to review your CT scan, or sooner if you have any problems     Instructions  We will repeat your chest x-ray today. We will plan to repeat your CT scan of the chest in the future to ensure that your sarcoidosis is stable.  We will probably do this in January 2022.  If you have any changes in your breathing, cough, any new symptoms we may decide to do so sooner. Follow with Dr Lamonte Sakai in 6 months to review your CT scan, or sooner if you have any problems         After Visit Summary (Printed 09/08/2019) Communications    [facility] Provider CC Chart Rep sent to Susy Frizzle, MD Media From this encounter Electronic signature on 09/08/2019 11:58 AM - E-signed   Communication Routing History  Recipient Method Sent by Date Sent  Susy Frizzle, MD In Rolinda Roan, MD 09/08/2019     No questionnaires available.           Orders Placed    DG Chest 2 View   CT Chest Wo Contrast Medication Changes    None   Medication List  Visit Diagnoses    Sarcoidosis   Problem List  Level of Service  Level of Service  PR OFFICE/OUTPATIENT ESTABLISHED HIGH MDM 40-54 MIN [17793]  Log History  LOS History  All Charges for This Encounter  Code Description Service Date Service Provider Modifiers Qty  [phone] PR OFFICE/OUTPATIENT ESTABLISHED HIGH MDM 40-54 MIN 09/08/2019 Collene Gobble, MD  1  [id number] PR ASA/ANTIPLAT THER USED 09/08/2019 Collene Gobble, MD  1  1036F PR CURRENT TOBACCO NON-USER 09/08/2019 Collene Gobble, MD      Was appointment offered to patient (explain)?     Reason for call: Cough,wheezing started about 4 days ago. Tried to use OTC mucinex has not helped. Patient still has her wound Vac.Patient would like prednisone sent into her pharmacy.  Dr.Byrum can you please advise.   No Known Allergies  Immunization History  Administered Date(s) Administered  . Hep A / Hep B 07/05/2008, 08/16/2008, 05/10/2009  . Influenza Whole 11/06/2011  . Influenza,inj,Quad PF,6+ Mos 12/13/2014, 12/19/2015, 01/06/2017, 03/22/2018, 12/15/2018  . Influenza-Unspecified 11/06/2011, 10/29/2012  . Tdap 08/18/2010, 10/27/2011

## 2019-11-22 NOTE — Telephone Encounter (Signed)
OK to treat with 5 days of prednisone 30mg  qd.

## 2019-11-22 NOTE — Telephone Encounter (Signed)
Spoke with the pt and notified of recs per Dr Lamonte Sakai  She verbalized understanding  Rx sent to pharm

## 2019-11-23 ENCOUNTER — Inpatient Hospital Stay: Payer: Medicare Other

## 2019-11-23 ENCOUNTER — Other Ambulatory Visit: Payer: Self-pay

## 2019-11-23 VITALS — BP 112/65 | HR 81 | Temp 98.3°F | Resp 18

## 2019-11-23 DIAGNOSIS — Z23 Encounter for immunization: Secondary | ICD-10-CM

## 2019-11-23 DIAGNOSIS — D500 Iron deficiency anemia secondary to blood loss (chronic): Secondary | ICD-10-CM

## 2019-11-23 DIAGNOSIS — D509 Iron deficiency anemia, unspecified: Secondary | ICD-10-CM | POA: Diagnosis not present

## 2019-11-23 DIAGNOSIS — K9089 Other intestinal malabsorption: Secondary | ICD-10-CM | POA: Diagnosis not present

## 2019-11-23 MED ORDER — SODIUM CHLORIDE 0.9 % IV SOLN
Freq: Once | INTRAVENOUS | Status: AC
  Filled 2019-11-23: qty 250

## 2019-11-23 MED ORDER — INFLUENZA VAC SPLIT QUAD 0.5 ML IM SUSY
PREFILLED_SYRINGE | INTRAMUSCULAR | Status: AC
  Filled 2019-11-23: qty 0.5

## 2019-11-23 MED ORDER — INFLUENZA VAC SPLIT QUAD 0.5 ML IM SUSY
0.5000 mL | PREFILLED_SYRINGE | Freq: Once | INTRAMUSCULAR | Status: AC
  Administered 2019-11-23: 0.5 mL via INTRAMUSCULAR

## 2019-11-23 MED ORDER — SODIUM CHLORIDE 0.9 % IV SOLN
510.0000 mg | Freq: Once | INTRAVENOUS | Status: AC
  Administered 2019-11-23: 510 mg via INTRAVENOUS
  Filled 2019-11-23: qty 510

## 2019-11-23 NOTE — Patient Instructions (Signed)
Ferumoxytol injection What is this medicine? FERUMOXYTOL is an iron complex. Iron is used to make healthy red blood cells, which carry oxygen and nutrients throughout the body. This medicine is used to treat iron deficiency anemia. This medicine may be used for other purposes; ask your health care provider or pharmacist if you have questions. COMMON BRAND NAME(S): Feraheme What should I tell my health care provider before I take this medicine? They need to know if you have any of these conditions:  anemia not caused by low iron levels  high levels of iron in the blood  magnetic resonance imaging (MRI) test scheduled  an unusual or allergic reaction to iron, other medicines, foods, dyes, or preservatives  pregnant or trying to get pregnant  breast-feeding How should I use this medicine? This medicine is for injection into a vein. It is given by a health care professional in a hospital or clinic setting. Talk to your pediatrician regarding the use of this medicine in children. Special care may be needed. Overdosage: If you think you have taken too much of this medicine contact a poison control center or emergency room at once. NOTE: This medicine is only for you. Do not share this medicine with others. What if I miss a dose? It is important not to miss your dose. Call your doctor or health care professional if you are unable to keep an appointment. What may interact with this medicine? This medicine may interact with the following medications:  other iron products This list may not describe all possible interactions. Give your health care provider a list of all the medicines, herbs, non-prescription drugs, or dietary supplements you use. Also tell them if you smoke, drink alcohol, or use illegal drugs. Some items may interact with your medicine. What should I watch for while using this medicine? Visit your doctor or healthcare professional regularly. Tell your doctor or healthcare  professional if your symptoms do not start to get better or if they get worse. You may need blood work done while you are taking this medicine. You may need to follow a special diet. Talk to your doctor. Foods that contain iron include: whole grains/cereals, dried fruits, beans, or peas, leafy green vegetables, and organ meats (liver, kidney). What side effects may I notice from receiving this medicine? Side effects that you should report to your doctor or health care professional as soon as possible:  allergic reactions like skin rash, itching or hives, swelling of the face, lips, or tongue  breathing problems  changes in blood pressure  feeling faint or lightheaded, falls  fever or chills  flushing, sweating, or hot feelings  swelling of the ankles or feet Side effects that usually do not require medical attention (report to your doctor or health care professional if they continue or are bothersome):  diarrhea  headache  nausea, vomiting  stomach pain This list may not describe all possible side effects. Call your doctor for medical advice about side effects. You may report side effects to FDA at 1-800-FDA-1088. Where should I keep my medicine? This drug is given in a hospital or clinic and will not be stored at home. NOTE: This sheet is a summary. It may not cover all possible information. If you have questions about this medicine, talk to your doctor, pharmacist, or health care provider.  2020 Elsevier/Gold Standard (2016-03-30 20:21:10)  Influenza Virus Vaccine injection What is this medicine? INFLUENZA VIRUS VACCINE (in floo EN zuh VAHY ruhs vak SEEN) helps to reduce the   risk of getting influenza also known as the flu. The vaccine only helps protect you against some strains of the flu. This medicine may be used for other purposes; ask your health care provider or pharmacist if you have questions. COMMON BRAND NAME(S): Afluria, Afluria Quadrivalent, Agriflu, Alfuria, FLUAD,  Fluarix, Fluarix Quadrivalent, Flublok, Flublok Quadrivalent, FLUCELVAX, FLUCELVAX Quadrivalent, Flulaval, Flulaval Quadrivalent, Fluvirin, Fluzone, Fluzone High-Dose, Fluzone Intradermal, Fluzone Quadrivalent What should I tell my health care provider before I take this medicine? They need to know if you have any of these conditions:  bleeding disorder like hemophilia  fever or infection  Guillain-Barre syndrome or other neurological problems  immune system problems  infection with the human immunodeficiency virus (HIV) or AIDS  low blood platelet counts  multiple sclerosis  an unusual or allergic reaction to influenza virus vaccine, latex, other medicines, foods, dyes, or preservatives. Different brands of vaccines contain different allergens. Some may contain latex or eggs. Talk to your doctor about your allergies to make sure that you get the right vaccine.  pregnant or trying to get pregnant  breast-feeding How should I use this medicine? This vaccine is for injection into a muscle or under the skin. It is given by a health care professional. A copy of Vaccine Information Statements will be given before each vaccination. Read this sheet carefully each time. The sheet may change frequently. Talk to your healthcare provider to see which vaccines are right for you. Some vaccines should not be used in all age groups. Overdosage: If you think you have taken too much of this medicine contact a poison control center or emergency room at once. NOTE: This medicine is only for you. Do not share this medicine with others. What if I miss a dose? This does not apply. What may interact with this medicine?  chemotherapy or radiation therapy  medicines that lower your immune system like etanercept, anakinra, infliximab, and adalimumab  medicines that treat or prevent blood clots like warfarin  phenytoin  steroid medicines like prednisone or cortisone  theophylline  vaccines This  list may not describe all possible interactions. Give your health care provider a list of all the medicines, herbs, non-prescription drugs, or dietary supplements you use. Also tell them if you smoke, drink alcohol, or use illegal drugs. Some items may interact with your medicine. What should I watch for while using this medicine? Report any side effects that do not go away within 3 days to your doctor or health care professional. Call your health care provider if any unusual symptoms occur within 6 weeks of receiving this vaccine. You may still catch the flu, but the illness is not usually as bad. You cannot get the flu from the vaccine. The vaccine will not protect against colds or other illnesses that may cause fever. The vaccine is needed every year. What side effects may I notice from receiving this medicine? Side effects that you should report to your doctor or health care professional as soon as possible:  allergic reactions like skin rash, itching or hives, swelling of the face, lips, or tongue Side effects that usually do not require medical attention (report to your doctor or health care professional if they continue or are bothersome):  fever  headache  muscle aches and pains  pain, tenderness, redness, or swelling at the injection site  tiredness This list may not describe all possible side effects. Call your doctor for medical advice about side effects. You may report side effects to FDA at 1-800-FDA-1088.   Where should I keep my medicine? The vaccine will be given by a health care professional in a clinic, pharmacy, doctor's office, or other health care setting. You will not be given vaccine doses to store at home. NOTE: This sheet is a summary. It may not cover all possible information. If you have questions about this medicine, talk to your doctor, pharmacist, or health care provider.  2020 Elsevier/Gold Standard (2018-01-04 08:45:43)   

## 2019-11-24 ENCOUNTER — Encounter (HOSPITAL_BASED_OUTPATIENT_CLINIC_OR_DEPARTMENT_OTHER): Payer: Medicare Other | Attending: Internal Medicine | Admitting: Internal Medicine

## 2019-11-24 DIAGNOSIS — K91873 Postprocedural seroma of a digestive system organ or structure following other procedure: Secondary | ICD-10-CM | POA: Diagnosis not present

## 2019-11-24 DIAGNOSIS — Z933 Colostomy status: Secondary | ICD-10-CM | POA: Diagnosis not present

## 2019-11-24 DIAGNOSIS — D869 Sarcoidosis, unspecified: Secondary | ICD-10-CM | POA: Diagnosis not present

## 2019-11-24 DIAGNOSIS — F419 Anxiety disorder, unspecified: Secondary | ICD-10-CM | POA: Diagnosis not present

## 2019-11-24 DIAGNOSIS — X58XXXS Exposure to other specified factors, sequela: Secondary | ICD-10-CM | POA: Insufficient documentation

## 2019-11-24 DIAGNOSIS — T8131XA Disruption of external operation (surgical) wound, not elsewhere classified, initial encounter: Secondary | ICD-10-CM | POA: Diagnosis not present

## 2019-11-24 DIAGNOSIS — D509 Iron deficiency anemia, unspecified: Secondary | ICD-10-CM | POA: Diagnosis not present

## 2019-11-24 DIAGNOSIS — S31105S Unspecified open wound of abdominal wall, periumbilic region without penetration into peritoneal cavity, sequela: Secondary | ICD-10-CM | POA: Diagnosis not present

## 2019-11-24 DIAGNOSIS — Z9884 Bariatric surgery status: Secondary | ICD-10-CM | POA: Insufficient documentation

## 2019-11-24 DIAGNOSIS — F319 Bipolar disorder, unspecified: Secondary | ICD-10-CM | POA: Diagnosis not present

## 2019-11-24 DIAGNOSIS — F209 Schizophrenia, unspecified: Secondary | ICD-10-CM | POA: Diagnosis not present

## 2019-11-24 DIAGNOSIS — K91872 Postprocedural seroma of a digestive system organ or structure following a digestive system procedure: Secondary | ICD-10-CM | POA: Diagnosis not present

## 2019-11-24 NOTE — Progress Notes (Signed)
ALIS, SAWCHUK ([id number]) Visit Report for 11/24/2019 Arrival Information Details Patient Name: Date of Service: [name], Melissa [name] 11/24/2019 11:00 A M Medical Record Number: [id number] Patient Account Number: [id number] Date of Birth/Sex: Treating RN: 1975/03/09 (44 y.o. [name] Primary Care Scharlene Catalina: Elio Forget RD, Hillery Aldo Other Clinician: Referring Firas Guardado: Treating Aundrey Elahi/Extender: Johnella Moloney RD, [place] RREN Weeks in Treatment: 5 Visit Information History Since Last Visit Added or deleted any medications: No Patient Arrived: Ambulatory Any new allergies or adverse reactions: No Arrival Time: 11:09 Had a fall or experienced change in No Accompanied By: self activities of daily living that may affect Transfer Assistance: None risk of falls: Patient Identification Verified: Yes Signs or symptoms of abuse/neglect since last visito No Secondary Verification Process Completed: Yes Hospitalized since last visit: No Patient Requires Transmission-Based Precautions: No Implantable device outside of the clinic excluding No Patient Has Alerts: No cellular tissue based products placed in the center since last visit: Has Dressing in Place as Prescribed: Yes Pain Present Now: No Electronic Signature(s) Signed: 11/24/2019 4:31:44 PM By: Baruch Gouty RN, BSN Entered By: Baruch Gouty on 11/24/2019 11:11:39 -------------------------------------------------------------------------------- Clinic Level of Care Assessment Details Patient Name: Date of Service: Melissa Spray, MA [name] 11/24/2019 11:00 A M Medical Record Number: [id number] Patient Account Number: [id number] Date of Birth/Sex: Treating RN: [birth year] (44 y.o. Clearnce Sorrel Primary Care Merly Hinkson: Elio Forget RD, [place] RREN Other Clinician: Referring Anyela Napierkowski: Treating Chieko Neises/Extender: Johnella Moloney RD, [place] RREN Weeks in Treatment: 5 Clinic Level of Care Assessment Items TOOL 4 Quantity Score X- 1  0 Use when only an EandM is performed on FOLLOW-UP visit ASSESSMENTS - Nursing Assessment / Reassessment X- 1 10 Reassessment of Co-morbidities (includes updates in patient status) X- 1 5 Reassessment of Adherence to Treatment Plan ASSESSMENTS - Wound and Skin A ssessment / Reassessment X - Simple Wound Assessment / Reassessment - one wound 1 5 []  - 0 Complex Wound Assessment / Reassessment - multiple wounds []  - 0 Dermatologic / Skin Assessment (not related to wound area) ASSESSMENTS - Focused Assessment []  - 0 Circumferential Edema Measurements - multi extremities []  - 0 Nutritional Assessment / Counseling / Intervention []  - 0 Lower Extremity Assessment (monofilament, tuning fork, pulses) []  - 0 Peripheral Arterial Disease Assessment (using hand held doppler) ASSESSMENTS - Ostomy and/or Continence Assessment and Care []  - 0 Incontinence Assessment and Management []  - 0 Ostomy Care Assessment and Management (repouching, etc.) PROCESS - Coordination of Care X - Simple Patient / Family Education for ongoing care 1 15 []  - 0 Complex (extensive) Patient / Family Education for ongoing care X- 1 10 Staff obtains Programmer, systems, Records, T Results / Process Orders est X- 1 10 Staff telephones HHA, Nursing Homes / Clarify orders / etc []  - 0 Routine Transfer to another Facility (non-emergent condition) []  - 0 Routine Hospital Admission (non-emergent condition) []  - 0 New Admissions / Biomedical engineer / Ordering NPWT Apligraf, etc. , []  - 0 Emergency Hospital Admission (emergent condition) X- 1 10 Simple Discharge Coordination []  - 0 Complex (extensive) Discharge Coordination PROCESS - Special Needs []  - 0 Pediatric / Minor Patient Management []  - 0 Isolation Patient Management []  - 0 Hearing / Language / Visual special needs []  - 0 Assessment of Community assistance (transportation, D/C planning, etc.) []  - 0 Additional assistance / Altered mentation []  -  0 Support Surface(s) Assessment (bed, cushion, seat, etc.) INTERVENTIONS - Wound Cleansing / Measurement X - Simple Wound Cleansing -  one wound 1 5 []  - 0 Complex Wound Cleansing - multiple wounds X- 1 5 Wound Imaging (photographs - any number of wounds) []  - 0 Wound Tracing (instead of photographs) X- 1 5 Simple Wound Measurement - one wound []  - 0 Complex Wound Measurement - multiple wounds INTERVENTIONS - Wound Dressings []  - 0 Small Wound Dressing one or multiple wounds X- 1 15 Medium Wound Dressing one or multiple wounds []  - 0 Large Wound Dressing one or multiple wounds X- 1 5 Application of Medications - topical []  - 0 Application of Medications - injection INTERVENTIONS - Miscellaneous []  - 0 External ear exam []  - 0 Specimen Collection (cultures, biopsies, blood, body fluids, etc.) []  - 0 Specimen(s) / Culture(s) sent or taken to Lab for analysis []  - 0 Patient Transfer (multiple staff / [employer] Service fast streamer / Similar devices) []  - 0 Simple Staple / Suture removal (25 or less) []  - 0 Complex Staple / Suture removal (26 or more) []  - 0 Hypo / Hyperglycemic Management (close monitor of Blood Glucose) []  - 0 Ankle / Brachial Index (ABI) - do not check if billed separately X- 1 5 Vital Signs Has the patient been seen at the hospital within the last three years: Yes Total Score: 105 Level Of Care: New/Established - Level 3 Electronic Signature(s) Signed: 11/24/2019 4:10:39 PM By: Kela Millin Entered By: Kela Millin on 11/24/2019 11:31:47 -------------------------------------------------------------------------------- Encounter Discharge Information Details Patient Name: Date of Service: Melissa Spray, MA [name] 11/24/2019 11:00 A M Medical Record Number: [id number] Patient Account Number: [id number] Date of Birth/Sex: Treating RN: [birth year] (44 y.o. Debby Bud Primary Care Sephiroth Mcluckie: [place], Hillery Aldo Other Clinician: Referring Niamya Vittitow: Treating  Ayriel Texidor/Extender: Johnella Moloney RD, [place] RREN Weeks in Treatment: 5 Encounter Discharge Information Items Discharge Condition: Stable Ambulatory Status: Ambulatory Discharge Destination: Home Transportation: Private Auto Accompanied By: self Schedule Follow-up Appointment: Yes Clinical Summary of Care: Electronic Signature(s) Signed: 11/24/2019 4:18:11 PM By: Deon Pilling Entered By: Deon Pilling on 11/24/2019 12:01:20 -------------------------------------------------------------------------------- Lower Extremity Assessment Details Patient Name: Date of Service: Melissa Spray, MA [name] 11/24/2019 11:00 A M Medical Record Number: [id number] Patient Account Number: [id number] Date of Birth/Sex: Treating RN: [birth year] (44 y.o. [name] Primary Care Quindell Shere: [employer], Hillery Aldo Other Clinician: Referring Bransyn Adami: Treating Shenekia Riess/Extender: Johnella Moloney RD, [place] in Treatment: 5 Electronic Signature(s) Signed: 11/24/2019 4:31:44 PM By: Baruch Gouty RN, BSN Entered By: Baruch Gouty on 11/24/2019 11:25:44 -------------------------------------------------------------------------------- Multi Wound Chart Details Patient Name: Date of Service: Melissa Spray, MA [name] 11/24/2019 11:00 A M Medical Record Number: [id number] Patient Account Number: [id number] Date of Birth/Sex: Treating RN: 16-Mar-1975 (44 y.o. Clearnce Sorrel Primary Care Hanad Leino: Elio Forget RD, [place] RREN Other Clinician: Referring Teressa Mcglocklin: Treating Somnang Mahan/Extender: Johnella Moloney RD, [place] RREN Weeks in Treatment: 5 Vital Signs Height(in): 66 Pulse(bpm): 96 Weight(lbs): 235 Blood Pressure(mmHg): 132/81 Body Mass Index(BMI): 38 Temperature(F): 99 Respiratory Rate(breaths/min): 18 Photos: [1:No Photos Abdomen - midline] [N/A:N/A N/A] Wound Location: [1:Surgical Injury] [N/A:N/A] Wounding Event: [1:Open Surgical Wound] [N/A:N/A] Primary Etiology: [1:Anemia, Hypertension,  Osteoarthritis,] [N/A:N/A] Comorbid History: [1:Confinement Anxiety 06/29/2019] [N/A:N/A] Date Acquired: [1:5] [N/A:N/A] Weeks of Treatment: [1:Open] [N/A:N/A] Wound Status: [1:3.8x1.4x4] [N/A:N/A] Measurements L x W x D (cm) [1:4.178] [N/A:N/A] A (cm) : rea [1:16.713] [N/A:N/A] Volume (cm) : [1:70.80%] [N/A:N/A] % Reduction in A rea: [1:79.80%] [N/A:N/A] % Reduction in Volume: [1:7] Starting Position 1 (o'clock): [1:4] Ending Position 1 (o'clock): [1:8.2] Maximum Distance 1 (cm): [1:Yes] [  N/A:N/A] Undermining: [1:Full Thickness With Exposed Support] [N/A:N/A] Classification: [1:Structures Large] [N/A:N/A] Exudate Amount: [1:Purulent] [N/A:N/A] Exudate Type: [1:yellow, brown, green] [N/A:N/A] Exudate Color: [1:Yes] [N/A:N/A] Foul Odor A Cleansing: [1:fter No] [N/A:N/A] Odor Anticipated Due to Product Use: [1:Well defined, not attached] [N/A:N/A] Wound Margin: [1:Large (67-100%)] [N/A:N/A] Granulation A mount: [1:Pink, Pale] [N/A:N/A] Granulation Quality: [1:None Present (0%)] [N/A:N/A] Necrotic Amount: [1:Fat Layer (Subcutaneous Tissue): Yes N/A] Exposed Structures: [1:Muscle: Yes Fascia: No Tendon: No Joint: No Bone: No None] [N/A:N/A] Treatment Notes Wound #1 (Abdomen - midline) 1. Cleanse With Wound Cleanser 2. Periwound Care Skin Prep 3. Primary Dressing Applied Other primary dressing (specifiy in notes) 4. Secondary Dressing ABD Pad 5. Secured With Medipore tape Notes wet to dry kerlix packing. HH to apply wound vac. Electronic Signature(s) Signed: 11/24/2019 4:10:39 PM By: Kela Millin Signed: 11/24/2019 4:24:19 PM By: Linton Ham MD Entered By: Linton Ham on 11/24/2019 12:27:15 -------------------------------------------------------------------------------- Multi-Disciplinary Care Plan Details Patient Name: Date of Service: Melissa Spray, MA [name] 11/24/2019 11:00 A M Medical Record Number: [id number] Patient Account Number: [id number] Date of  Birth/Sex: Treating RN: [birth year] (44 y.o. Clearnce Sorrel Primary Care Edessa Jakubowicz: Elio Forget RD, Hillery Aldo Other Clinician: Referring Anzleigh Slaven: Treating Kathlene Yano/Extender: Johnella Moloney RD, [place] RREN Weeks in Treatment: 5 Active Inactive Nutrition Nursing Diagnoses: Potential for alteratiion in Nutrition/Potential for imbalanced nutrition Goals: Patient/caregiver agrees to and verbalizes understanding of need to use nutritional supplements and/or vitamins as prescribed Date Initiated: 10/16/2019 Target Resolution Date: 12/15/2019 Goal Status: Active Interventions: Assess patient nutrition upon admission and as needed per policy Provide education on nutrition Treatment Activities: Education provided on Nutrition : 10/16/2019 Notes: Wound/Skin Impairment Nursing Diagnoses: Impaired tissue integrity Knowledge deficit related to ulceration/compromised skin integrity Goals: Patient/caregiver will verbalize understanding of skin care regimen Date Initiated: 10/16/2019 Target Resolution Date: 12/15/2019 Goal Status: Active Ulcer/skin breakdown will have a volume reduction of 30% by week 4 Date Initiated: 10/16/2019 Target Resolution Date: 12/15/2019 Goal Status: Active Interventions: Assess patient/caregiver ability to obtain necessary supplies Assess patient/caregiver ability to perform ulcer/skin care regimen upon admission and as needed Assess ulceration(s) every visit Provide education on ulcer and skin care Notes: Electronic Signature(s) Signed: 11/24/2019 4:10:39 PM By: Kela Millin Entered By: Kela Millin on 11/24/2019 11:04:06 -------------------------------------------------------------------------------- Pain Assessment Details Patient Name: Date of Service: Melissa Spray, MA [name] 11/24/2019 11:00 A M Medical Record Number: [id number] Patient Account Number: [id number] Date of Birth/Sex: Treating RN: 06-15-1975 (44 y.o. [name] Primary Care  Presley Gora: [place], Hillery Aldo Other Clinician: Referring Daisie Haft: Treating Zsofia Prout/Extender: Johnella Moloney RD, [place] RREN Weeks in Treatment: 5 Active Problems Location of Pain Severity and Description of Pain Patient Has Paino No Site Locations Rate the pain. Current Pain Level: 0 Pain Management and Medication Current Pain Management: Electronic Signature(s) Signed: 11/24/2019 4:31:44 PM By: Baruch Gouty RN, BSN Entered By: Baruch Gouty on 11/24/2019 11:25:36 -------------------------------------------------------------------------------- Patient/Caregiver Education Details Patient Name: Date of Service: Melissa Spray, MA [name] 10/1/2021andnbsp11:00 A M Medical Record Number: [id number] Patient Account Number: [id number] Date of Birth/Gender: Treating RN: 31-Aug-1975 (44 y.o. Clearnce Sorrel Primary Care Physician: [place], [place] Other Clinician: Referring Physician: Treating Physician/Extender: Johnella Moloney RD, [place] Weeks in Treatment: 5 Education Assessment Education Provided To: Patient Education Topics Provided Nutrition: Responses: State content correctly Wound/Skin Impairment: Handouts: Caring for Your Ulcer Methods: Explain/Verbal Responses: State content correctly Electronic Signature(s) Signed: 11/24/2019 4:10:39 PM By: Kela Millin Signed: 11/24/2019 4:10:39 PM By: Kela Millin Entered  By: Kela Millin on 11/24/2019 11:04:31 -------------------------------------------------------------------------------- Wound Assessment Details Patient Name: Date of Service: [name], Melissa [name] 11/24/2019 11:00 A M Medical Record Number: [id number] Patient Account Number: [id number] Date of Birth/Sex: Treating RN: 1975/04/17 (44 y.o. [name] Primary Care [place]: Elio Forget RD, Hillery Aldo Other Clinician: Referring Dewitt Judice: Treating Jadah Bobak/Extender: Johnella Moloney RD, [place] RREN Weeks in Treatment: 5 Wound  Status Wound Number: 1 Primary Etiology: Open Surgical Wound Wound Location: Abdomen - midline Wound Status: Open Wounding Event: Surgical Injury Comorbid History: Anemia, Hypertension, Osteoarthritis, Confinement Anxiety Date Acquired: 06/29/2019 Weeks Of Treatment: 5 Clustered Wound: No Wound Measurements Length: (cm) 3.8 Width: (cm) 1.4 Depth: (cm) 4 Area: (cm) 4.178 Volume: (cm) 16.713 % Reduction in Area: 70.8% % Reduction in Volume: 79.8% Epithelialization: None Tunneling: No Undermining: Yes Starting Position (o'clock): 7 Ending Position (o'clock): 4 Maximum Distance: (cm) 8.2 Wound Description Classification: Full Thickness With Exposed Support Structures Wound Margin: Well defined, not attached Exudate Amount: Large Exudate Type: Purulent Exudate Color: yellow, brown, green Foul Odor After Cleansing: Yes Due to Product Use: No Slough/Fibrino No Wound Bed Granulation Amount: Large (67-100%) Exposed Structure Granulation Quality: Pink, Pale Fascia Exposed: No Necrotic Amount: None Present (0%) Fat Layer (Subcutaneous Tissue) Exposed: Yes Tendon Exposed: No Muscle Exposed: Yes Necrosis of Muscle: No Joint Exposed: No Bone Exposed: No Treatment Notes Wound #1 (Abdomen - midline) 1. Cleanse With Wound Cleanser 2. Periwound Care Skin Prep 3. Primary Dressing Applied Other primary dressing (specifiy in notes) 4. Secondary Dressing ABD Pad 5. Secured With Medipore tape Notes wet to dry kerlix packing. HH to apply wound vac. Electronic Signature(s) Signed: 11/24/2019 4:31:44 PM By: Baruch Gouty RN, BSN Entered By: Baruch Gouty on 11/24/2019 11:27:15 -------------------------------------------------------------------------------- [place] Details Patient Name: Date of Service: Melissa Spray, MA [name] 11/24/2019 11:00 A M Medical Record Number: [id number] Patient Account Number: [id number] Date of Birth/Sex: Treating RN: 01-05-1976 (44 y.o. [name] Primary Care Tyeesha Riker: [place], Hillery Aldo Other Clinician: Referring Manoah Deckard: Treating Tavari Loadholt/Extender: Johnella Moloney RD, [place] RREN Weeks in Treatment: 5 Vital Signs Time Taken: 11:15 Temperature (F): 99 Height (in): 66 Pulse (bpm): 96 Source: Stated Respiratory Rate (breaths/min): 18 Weight (lbs): 235 Blood Pressure (mmHg): 132/81 Source: Stated Reference Range: 80 - 120 mg / dl Body Mass Index (BMI): 37.9 Electronic Signature(s) Signed: 11/24/2019 4:31:44 PM By: Baruch Gouty RN, BSN Entered By: Baruch Gouty on 11/24/2019 11:15:43

## 2019-11-24 NOTE — Progress Notes (Signed)
CRISTINE, DAW ([id number]) Visit Report for 11/24/2019 HPI Details Patient Name: Date of Service: [name], [place] [name] 11/24/2019 11:00 A M Medical Record Number: [id number] Patient Account Number: [id number] Date of Birth/Sex: Treating RN: 04-21-1975 (44 y.o. Clearnce Sorrel Primary Care Provider: Elio Forget RD, Hillery Aldo Other Clinician: Referring Provider: Treating Provider/Extender: Johnella Moloney RD, [place] RREN Weeks in Treatment: 5 History of Present Illness HPI Description: ADMISSION 10/16/2019 This is a 44 year old woman who apparently had a perforated diverticulum in the mid 2000's. She required a colostomy and then reversal. She did well up until 2009 she required an incisional hernia repair at an outside hospital. She had a gastric sleeve procedure as well in 2015. Sometime after this she developed an abdominal seroma. She had abdominal drains as initial management. She had drainage of the seroma cavity in 2016 in the OR. This resulted in resolution of the seroma cavity for a long period of time. She represented in December of last year year with a large amount of fluid. Her last CT scan was in April noted that she had a small residual fluid collection. However because of continued drainage it was elected that she would go ahead with a surgical procedure for debridement of the seroma cavity. She was taken to the OR on Jun 29, 2019. The measurements of the area were 15 x 15 x 3 at that point. A wound VAC was placed postoperatively. She tells me she loses a lot of blood in the wound VAC and she is iron deficient. Last hemoglobin I see was 11.9 on 06/27/2019. Her albumin was normal. The patient comes in the clinic here very frustrated with the lack of progress. She says the wound is not healed at all with the wound VAC. She continues to have what she describes as a large amount of sanguinous drainage and she changes the canister every day. She is not having any pain or fever. She  is wondering what her options are given the fact that she believes the wound VAC to be ineffective. Past medical history includes bipolar disorder, sarcoidosis, TIA, perforated diverticulum in the mid 2000's, incisional hernia repair in 2009, gastric sleeve procedure in 2015, hypertension 10/23/2019; there has been some improvement in her dimensions. She still changes the canister every 30 hours. I have not cultured this but there is an odor. Current dimensions are 5.5 x 2.5 x 4.2 with 6.5 cm of maximum undermining superiorly. 9/17; 2 week follow-up. The patient is using a wound VAC. She is able to show me her sanguinous drainage in the canister. She also reports that when the wound VAC is off for any period of time she develops fever up to 100. Sodium note she was referred to hematology for chronic anemia. It was felt that this is iron deficiency probably secondary to poor iron absorption. Her hemoglobin was measured at 10 with low MCH and MCHC at 25.2 with 29.9 respectively. Platelets slightly up at 459 iron was 16, TIBC at 254. Saturation 6 ferritin at 63 B12 287 10/1; 2-week follow-up. For the first time we have improvement in her dimensions the depth is improved and some of the maximal undermining. Surface of the wound looks better. When she tells me she often turns her wound VAC off at night because she does not like the alarm. She has an appointment with Dr. Claudia Desanctis sometime in mid October. This referral was put in by [name] her general surgeon Electronic Signature(s) Signed: 11/24/2019 4:24:19 PM By:  Linton Ham MD Entered By: Linton Ham on 11/24/2019 12:28:31 -------------------------------------------------------------------------------- Physical Exam Details Patient Name: Date of Service: [name], [place] [name] 11/24/2019 11:00 A M Medical Record Number: [id number] Patient Account Number: [id number] Date of Birth/Sex: Treating RN: September 25, 1975 (44 y.o. Clearnce Sorrel Primary  Care Provider: [place], Hillery Aldo Other Clinician: Referring Provider: Treating Provider/Extender: Johnella Moloney RD, [place] RREN Weeks in Treatment: 5 Constitutional Sitting or standing Blood Pressure is within target range for patient.. Pulse regular and within target range for patient.Marland Kitchen Respirations regular, non-labored and within target range.. Temperature is normal and within the target range for the patient.Marland Kitchen Appears in no distress. Gastrointestinal (GI) Abdomen is soft and non-distended without masses or tenderness.. No liver or spleen enlargement. Notes Wound exam; deep surgical wound. The surface of this looks better. There is still substantial undermining however the dimensions are measuring better. Better looking tissue on the surface of the wound what I can see. There is no evidence of Electronic Signature(s) Signed: 11/24/2019 4:24:19 PM By: Linton Ham MD Entered By: Linton Ham on 11/24/2019 12:35:45 -------------------------------------------------------------------------------- Physician Orders Details Patient Name: Date of Service: Melissa Spray, [place] [name] 11/24/2019 11:00 A M Medical Record Number: [id number] Patient Account Number: [id number] Date of Birth/Sex: Treating RN: 05-09-1975 (44 y.o. Clearnce Sorrel Primary Care Provider: [place], Hillery Aldo Other Clinician: Referring Provider: Treating Provider/Extender: Johnella Moloney RD, [place] RREN Weeks in Treatment: 5 Verbal / Phone Orders: No Diagnosis Coding ICD-10 Coding Code Description T81.31XA Disruption of external operation (surgical) wound, not elsewhere classified, initial encounter K91.873 Postprocedural seroma of a digestive system organ or structure following other procedure S31.105S Unspecified open wound of abdominal wall, periumbilic region without penetration into peritoneal cavity, sequela Follow-up Appointments Return Appointment in 2 weeks. Dressing Change Frequency Wound #1  Abdomen - midline Change dressing three times week. - by home health Skin Barriers/Peri-Wound Care Wound #1 Abdomen - midline Skin Prep Wound Cleansing Wound #1 Abdomen - midline Clean wound with Wound Cleanser - or normal saline Primary Wound Dressing Wound #1 Abdomen - midline Other: - wet-to-dry today in clinic, home health to apply wound vac Negative Presssure Wound Therapy Wound #1 Abdomen - midline Wound Vac to wound continuously at 121mm/hg pressure Black and White Foam combination - white foam in undermining/tunneling areas [facility] for wound care. - [facility] Electronic Signature(s) Signed: 11/24/2019 4:10:39 PM By: Kela Millin Signed: 11/24/2019 4:24:19 PM By: Linton Ham MD Entered By: Kela Millin on 11/24/2019 11:03:47 -------------------------------------------------------------------------------- Problem List Details Patient Name: Date of Service: Melissa Spray, [place] [name] 11/24/2019 11:00 A M Medical Record Number: [id number] Patient Account Number: [id number] Date of Birth/Sex: Treating RN: 1975/11/07 (43 y.o. Clearnce Sorrel Primary Care Provider: [place], Hillery Aldo Other Clinician: Referring Provider: Treating Provider/Extender: Johnella Moloney RD, [place] RREN Weeks in Treatment: 5 Active Problems ICD-10 Encounter Code Description Active Date MDM Diagnosis T81.31XA Disruption of external operation (surgical) wound, not elsewhere classified, 10/16/2019 No Yes initial encounter K91.873 Postprocedural seroma of a digestive system organ or structure following 10/16/2019 No Yes other procedure S31.105S Unspecified open wound of abdominal wall, periumbilic region without 07/01/3265 No Yes penetration into peritoneal cavity, sequela Inactive Problems Resolved Problems Electronic Signature(s) Signed: 11/24/2019 4:24:19 PM By: Linton Ham MD Entered By: Linton Ham on 11/24/2019  12:27:10 -------------------------------------------------------------------------------- Progress Note Details Patient Name: Date of Service: Melissa Spray, [place] [name] 11/24/2019 11:00 A M Medical Record Number: [id number]  Patient Account Number: [id number] Date of Birth/Sex: Treating RN: 1975/04/12 (44 y.o. Clearnce Sorrel Primary Care Provider: Elio Forget RD, Hillery Aldo Other Clinician: Referring Provider: Treating Provider/Extender: Johnella Moloney RD, [place] RREN Weeks in Treatment: 5 Subjective History of Present Illness (HPI) ADMISSION 10/16/2019 This is a 44 year old woman who apparently had a perforated diverticulum in the mid 2000's. She required a colostomy and then reversal. She did well up until 2009 she required an incisional hernia repair at an outside hospital. She had a gastric sleeve procedure as well in 2015. Sometime after this she developed an abdominal seroma. She had abdominal drains as initial management. She had drainage of the seroma cavity in 2016 in the OR. This resulted in resolution of the seroma cavity for a long period of time. She represented in December of last year year with a large amount of fluid. Her last CT scan was in April noted that she had a small residual fluid collection. However because of continued drainage it was elected that she would go ahead with a surgical procedure for debridement of the seroma cavity. She was taken to the OR on Jun 29, 2019. The measurements of the area were 15 x 15 x 3 at that point. A wound VAC was placed postoperatively. She tells me she loses a lot of blood in the wound VAC and she is iron deficient. Last hemoglobin I see was 11.9 on 06/27/2019. Her albumin was normal. The patient comes in the clinic here very frustrated with the lack of progress. She says the wound is not healed at all with the wound VAC. She continues to have what she describes as a large amount of sanguinous drainage and she changes the canister every day.  She is not having any pain or fever. She is wondering what her options are given the fact that she believes the wound VAC to be ineffective. Past medical history includes bipolar disorder, sarcoidosis, TIA, perforated diverticulum in the mid 2000's, incisional hernia repair in 2009, gastric sleeve procedure in 2015, hypertension 10/23/2019; there has been some improvement in her dimensions. She still changes the canister every 30 hours. I have not cultured this but there is an odor. Current dimensions are 5.5 x 2.5 x 4.2 with 6.5 cm of maximum undermining superiorly. 9/17; 2 week follow-up. The patient is using a wound VAC. She is able to show me her sanguinous drainage in the canister. She also reports that when the wound VAC is off for any period of time she develops fever up to 100. Sodium note she was referred to hematology for chronic anemia. It was felt that this is iron deficiency probably secondary to poor iron absorption. Her hemoglobin was measured at 10 with low MCH and MCHC at 25.2 with 29.9 respectively. Platelets slightly up at 459 iron was 16, TIBC at 254. Saturation 6 ferritin at 63 B12 287 10/1; 2-week follow-up. For the first time we have improvement in her dimensions the depth is improved and some of the maximal undermining. Surface of the wound looks better. When she tells me she often turns her wound VAC off at night because she does not like the alarm. She has an appointment with Dr. Claudia Desanctis sometime in mid October. This referral was put in by [name] her general surgeon Objective Constitutional Sitting or standing Blood Pressure is within target range for patient.. Pulse regular and within target range for patient.Marland Kitchen Respirations regular, non-labored and within target range.. Temperature is normal and within the target range  for the patient.Marland Kitchen Appears in no distress. Vitals Time Taken: 11:15 AM, Height: 66 in, Source: Stated, Weight: 235 lbs, Source: Stated, BMI: 37.9,  Temperature: 99 F, Pulse: 96 bpm, Respiratory Rate: 18 breaths/min, Blood Pressure: 132/81 mmHg. Gastrointestinal (GI) Abdomen is soft and non-distended without masses or tenderness.. No liver or spleen enlargement. General Notes: Wound exam; deep surgical wound. The surface of this looks better. There is still substantial undermining however the dimensions are measuring better. Better looking tissue on the surface of the wound what I can see. There is no evidence of Integumentary (Hair, Skin) Wound #1 status is Open. Original cause of wound was Surgical Injury. The wound is located on the Abdomen - midline. The wound measures 3.8cm length x 1.4cm width x 4cm depth; 4.178cm^2 area and 16.713cm^3 volume. There is muscle and Fat Layer (Subcutaneous Tissue) exposed. There is no tunneling noted, however, there is undermining starting at 7:00 and ending at 4:00 with a maximum distance of 8.2cm. There is a large amount of purulent drainage noted. Foul odor after cleansing was noted. The wound margin is well defined and not attached to the wound base. There is large (67-100%) pink, pale granulation within the wound bed. There is no necrotic tissue within the wound bed. Assessment Active Problems ICD-10 Disruption of external operation (surgical) wound, not elsewhere classified, initial encounter Postprocedural seroma of a digestive system organ or structure following other procedure Unspecified open wound of abdominal wall, periumbilic region without penetration into peritoneal cavity, sequela Plan Follow-up Appointments: Return Appointment in 2 weeks. Dressing Change Frequency: Wound #1 Abdomen - midline: Change dressing three times week. - by home health Skin Barriers/Peri-Wound Care: Wound #1 Abdomen - midline: Skin Prep Wound Cleansing: Wound #1 Abdomen - midline: Clean wound with Wound Cleanser - or normal saline Primary Wound Dressing: Wound #1 Abdomen - midline: Other: - wet-to-dry  today in clinic, home health to apply wound vac Negative Presssure Wound Therapy: Wound #1 Abdomen - midline: Wound Vac to wound continuously at 171mm/hg pressure Black and White Foam combination - white foam in undermining/tunneling areas Home Health: [facility] for wound care. - [facility] 1. The patient has a plastic surgery appointment with Dr. Claudia Desanctis on 10/15. Certainly if there is a surgical option here to close this I would be good to know. Other than that I think a wound VAC for substantial period of time would probably continue. 2. She had turns off the wound VAC at night I have asked her not to do this. She is also getting a lot of drainage. She was there is no bleeding when she comes into our clinic therefore I am doubtful her iron deficiency is only from blood loss in this wound area. She has had a gastric bypass. I wonder if there is another reason for her to be mellow absorbing iron 3. There is no evidence of infection in this area. The patient is particularly anxious to have this closed unfortunately I do not know of anything besides surgery that would give her that chance provided there is a procedure that would be offered Electronic Signature(s) Signed: 11/24/2019 4:24:19 PM By: Linton Ham MD Entered By: Linton Ham on 11/24/2019 12:37:33 -------------------------------------------------------------------------------- SuperBill Details Patient Name: Date of Service: Melissa Spray, [place] [name] 11/24/2019 Medical Record Number: [id number] Patient Account Number: [id number] Date of Birth/Sex: Treating RN: 1975-12-04 (44 y.o. Clearnce Sorrel Primary Care Provider: [place], Hillery Aldo Other Clinician: Referring Provider: Treating Provider/Extender: Johnella Moloney RD,  [place] RREN Weeks in Treatment: 5 Diagnosis Coding ICD-10 Codes Code Description T81.31XA Disruption of external operation (surgical) wound, not elsewhere classified, initial  encounter K91.873 Postprocedural seroma of a digestive system organ or structure following other procedure S31.105S Unspecified open wound of abdominal wall, periumbilic region without penetration into peritoneal cavity, sequela Facility Procedures CPT4 Code: [id number] Description: 99213 - WOUND CARE VISIT-LEV 3 EST PT Modifier: Quantity: 1 Physician Procedures : CPT4 Code Description Modifier [id number] - WC PHYS LEVEL 3 - EST PT ICD-10 Diagnosis Description T81.31XA Disruption of external operation (surgical) wound, not elsewhere classified, initial encounter K91.873 Postprocedural seroma of a digestive  system organ or structure following other procedure S31.105S Unspecified open wound of abdominal wall, periumbilic region without penetration into peritoneal cavity Quantity: 1 , sequela Electronic Signature(s) Signed: 11/24/2019 4:24:19 PM By: Linton Ham MD Entered By: Linton Ham on 11/24/2019 12:37:48

## 2019-11-27 DIAGNOSIS — F319 Bipolar disorder, unspecified: Secondary | ICD-10-CM | POA: Diagnosis not present

## 2019-11-27 DIAGNOSIS — K91872 Postprocedural seroma of a digestive system organ or structure following a digestive system procedure: Secondary | ICD-10-CM | POA: Diagnosis not present

## 2019-11-27 DIAGNOSIS — F419 Anxiety disorder, unspecified: Secondary | ICD-10-CM | POA: Diagnosis not present

## 2019-11-27 DIAGNOSIS — D869 Sarcoidosis, unspecified: Secondary | ICD-10-CM | POA: Diagnosis not present

## 2019-11-27 DIAGNOSIS — D509 Iron deficiency anemia, unspecified: Secondary | ICD-10-CM | POA: Diagnosis not present

## 2019-11-27 DIAGNOSIS — F209 Schizophrenia, unspecified: Secondary | ICD-10-CM | POA: Diagnosis not present

## 2019-11-28 DIAGNOSIS — F419 Anxiety disorder, unspecified: Secondary | ICD-10-CM | POA: Diagnosis not present

## 2019-11-28 DIAGNOSIS — Z9884 Bariatric surgery status: Secondary | ICD-10-CM | POA: Diagnosis not present

## 2019-11-28 DIAGNOSIS — Z87442 Personal history of urinary calculi: Secondary | ICD-10-CM | POA: Diagnosis not present

## 2019-11-28 DIAGNOSIS — F429 Obsessive-compulsive disorder, unspecified: Secondary | ICD-10-CM | POA: Diagnosis not present

## 2019-11-28 DIAGNOSIS — K91872 Postprocedural seroma of a digestive system organ or structure following a digestive system procedure: Secondary | ICD-10-CM | POA: Diagnosis not present

## 2019-11-28 DIAGNOSIS — D869 Sarcoidosis, unspecified: Secondary | ICD-10-CM | POA: Diagnosis not present

## 2019-11-28 DIAGNOSIS — F209 Schizophrenia, unspecified: Secondary | ICD-10-CM | POA: Diagnosis not present

## 2019-11-28 DIAGNOSIS — Z8673 Personal history of transient ischemic attack (TIA), and cerebral infarction without residual deficits: Secondary | ICD-10-CM | POA: Diagnosis not present

## 2019-11-28 DIAGNOSIS — F319 Bipolar disorder, unspecified: Secondary | ICD-10-CM | POA: Diagnosis not present

## 2019-11-28 DIAGNOSIS — Z79899 Other long term (current) drug therapy: Secondary | ICD-10-CM | POA: Diagnosis not present

## 2019-11-28 DIAGNOSIS — F988 Other specified behavioral and emotional disorders with onset usually occurring in childhood and adolescence: Secondary | ICD-10-CM | POA: Diagnosis not present

## 2019-11-28 DIAGNOSIS — Z7902 Long term (current) use of antithrombotics/antiplatelets: Secondary | ICD-10-CM | POA: Diagnosis not present

## 2019-11-28 DIAGNOSIS — K219 Gastro-esophageal reflux disease without esophagitis: Secondary | ICD-10-CM | POA: Diagnosis not present

## 2019-11-28 DIAGNOSIS — G4730 Sleep apnea, unspecified: Secondary | ICD-10-CM | POA: Diagnosis not present

## 2019-11-28 DIAGNOSIS — R918 Other nonspecific abnormal finding of lung field: Secondary | ICD-10-CM | POA: Diagnosis not present

## 2019-11-28 DIAGNOSIS — D509 Iron deficiency anemia, unspecified: Secondary | ICD-10-CM | POA: Diagnosis not present

## 2019-11-28 DIAGNOSIS — Z791 Long term (current) use of non-steroidal anti-inflammatories (NSAID): Secondary | ICD-10-CM | POA: Diagnosis not present

## 2019-11-28 DIAGNOSIS — Z79891 Long term (current) use of opiate analgesic: Secondary | ICD-10-CM | POA: Diagnosis not present

## 2019-11-30 DIAGNOSIS — K91872 Postprocedural seroma of a digestive system organ or structure following a digestive system procedure: Secondary | ICD-10-CM | POA: Diagnosis not present

## 2019-11-30 DIAGNOSIS — D509 Iron deficiency anemia, unspecified: Secondary | ICD-10-CM | POA: Diagnosis not present

## 2019-11-30 DIAGNOSIS — D508 Other iron deficiency anemias: Secondary | ICD-10-CM | POA: Diagnosis not present

## 2019-11-30 DIAGNOSIS — F419 Anxiety disorder, unspecified: Secondary | ICD-10-CM | POA: Diagnosis not present

## 2019-11-30 DIAGNOSIS — L7634 Postprocedural seroma of skin and subcutaneous tissue following other procedure: Secondary | ICD-10-CM | POA: Diagnosis not present

## 2019-11-30 DIAGNOSIS — F319 Bipolar disorder, unspecified: Secondary | ICD-10-CM | POA: Diagnosis not present

## 2019-11-30 DIAGNOSIS — Z09 Encounter for follow-up examination after completed treatment for conditions other than malignant neoplasm: Secondary | ICD-10-CM | POA: Diagnosis not present

## 2019-11-30 DIAGNOSIS — D869 Sarcoidosis, unspecified: Secondary | ICD-10-CM | POA: Diagnosis not present

## 2019-11-30 DIAGNOSIS — F209 Schizophrenia, unspecified: Secondary | ICD-10-CM | POA: Diagnosis not present

## 2019-12-02 DIAGNOSIS — K91872 Postprocedural seroma of a digestive system organ or structure following a digestive system procedure: Secondary | ICD-10-CM | POA: Diagnosis not present

## 2019-12-02 DIAGNOSIS — D509 Iron deficiency anemia, unspecified: Secondary | ICD-10-CM | POA: Diagnosis not present

## 2019-12-02 DIAGNOSIS — D869 Sarcoidosis, unspecified: Secondary | ICD-10-CM | POA: Diagnosis not present

## 2019-12-02 DIAGNOSIS — F209 Schizophrenia, unspecified: Secondary | ICD-10-CM | POA: Diagnosis not present

## 2019-12-02 DIAGNOSIS — F319 Bipolar disorder, unspecified: Secondary | ICD-10-CM | POA: Diagnosis not present

## 2019-12-02 DIAGNOSIS — F419 Anxiety disorder, unspecified: Secondary | ICD-10-CM | POA: Diagnosis not present

## 2019-12-04 DIAGNOSIS — D869 Sarcoidosis, unspecified: Secondary | ICD-10-CM | POA: Diagnosis not present

## 2019-12-04 DIAGNOSIS — F419 Anxiety disorder, unspecified: Secondary | ICD-10-CM | POA: Diagnosis not present

## 2019-12-04 DIAGNOSIS — F319 Bipolar disorder, unspecified: Secondary | ICD-10-CM | POA: Diagnosis not present

## 2019-12-04 DIAGNOSIS — F209 Schizophrenia, unspecified: Secondary | ICD-10-CM | POA: Diagnosis not present

## 2019-12-04 DIAGNOSIS — D509 Iron deficiency anemia, unspecified: Secondary | ICD-10-CM | POA: Diagnosis not present

## 2019-12-04 DIAGNOSIS — K91872 Postprocedural seroma of a digestive system organ or structure following a digestive system procedure: Secondary | ICD-10-CM | POA: Diagnosis not present

## 2019-12-06 DIAGNOSIS — D869 Sarcoidosis, unspecified: Secondary | ICD-10-CM | POA: Diagnosis not present

## 2019-12-06 DIAGNOSIS — F319 Bipolar disorder, unspecified: Secondary | ICD-10-CM | POA: Diagnosis not present

## 2019-12-06 DIAGNOSIS — F209 Schizophrenia, unspecified: Secondary | ICD-10-CM | POA: Diagnosis not present

## 2019-12-06 DIAGNOSIS — K91872 Postprocedural seroma of a digestive system organ or structure following a digestive system procedure: Secondary | ICD-10-CM | POA: Diagnosis not present

## 2019-12-06 DIAGNOSIS — F419 Anxiety disorder, unspecified: Secondary | ICD-10-CM | POA: Diagnosis not present

## 2019-12-06 DIAGNOSIS — D509 Iron deficiency anemia, unspecified: Secondary | ICD-10-CM | POA: Diagnosis not present

## 2019-12-07 ENCOUNTER — Telehealth: Payer: Self-pay

## 2019-12-07 ENCOUNTER — Telehealth: Payer: Self-pay | Admitting: Emergency Medicine

## 2019-12-07 NOTE — Telephone Encounter (Signed)
Spoke with pt. I was on the phone with her for over 15 minutes. Reports increased coughing, chest congestion and temp of 101. States that her symptoms have been increasingly getting worse over the past 3 weeks but she just started running a temp today. Advised her that she needed to be evaluated but we could not do it here as she is running a temp. Pt went on to tell me that she knows that she doesn't have COVID because she has had both of her vaccines and ALL of her family members are vaccinated. Advised her that just because she is vaccinated does not mean that she can't contract COVID. Pt states that she will go to an Urgent Care in [place], [place]. Nothing further was needed at this time.

## 2019-12-08 ENCOUNTER — Encounter (HOSPITAL_BASED_OUTPATIENT_CLINIC_OR_DEPARTMENT_OTHER): Payer: Medicare Other | Admitting: Internal Medicine

## 2019-12-08 DIAGNOSIS — K91872 Postprocedural seroma of a digestive system organ or structure following a digestive system procedure: Secondary | ICD-10-CM | POA: Diagnosis not present

## 2019-12-08 DIAGNOSIS — S31105S Unspecified open wound of abdominal wall, periumbilic region without penetration into peritoneal cavity, sequela: Secondary | ICD-10-CM | POA: Diagnosis not present

## 2019-12-08 DIAGNOSIS — F209 Schizophrenia, unspecified: Secondary | ICD-10-CM | POA: Diagnosis not present

## 2019-12-08 DIAGNOSIS — T8131XA Disruption of external operation (surgical) wound, not elsewhere classified, initial encounter: Secondary | ICD-10-CM | POA: Diagnosis not present

## 2019-12-08 DIAGNOSIS — D509 Iron deficiency anemia, unspecified: Secondary | ICD-10-CM | POA: Diagnosis not present

## 2019-12-08 DIAGNOSIS — Z933 Colostomy status: Secondary | ICD-10-CM | POA: Diagnosis not present

## 2019-12-08 DIAGNOSIS — D869 Sarcoidosis, unspecified: Secondary | ICD-10-CM | POA: Diagnosis not present

## 2019-12-08 DIAGNOSIS — F419 Anxiety disorder, unspecified: Secondary | ICD-10-CM | POA: Diagnosis not present

## 2019-12-08 DIAGNOSIS — K91873 Postprocedural seroma of a digestive system organ or structure following other procedure: Secondary | ICD-10-CM | POA: Diagnosis not present

## 2019-12-08 DIAGNOSIS — F319 Bipolar disorder, unspecified: Secondary | ICD-10-CM | POA: Diagnosis not present

## 2019-12-08 DIAGNOSIS — Z9884 Bariatric surgery status: Secondary | ICD-10-CM | POA: Diagnosis not present

## 2019-12-08 NOTE — Progress Notes (Signed)
XANA, BRADT ([id number]) Visit Report for 12/08/2019 Arrival Information Details Patient Name: Date of Service: [name], Melissa [name] 12/08/2019 11:00 A M Medical Record Number: [id number] Patient Account Number: [id number] Date of Birth/Sex: Treating [name]: [birth year] (44 y.o. [name], [name] Primary Care Ariann Khaimov: [name] Other Clinician: Referring [name]: Treating [name]/Extender: [name] in Treatment: 7 Visit Information History Since Last Visit Added or deleted any medications: No Patient Arrived: Ambulatory Any new allergies or adverse reactions: No Arrival Time: 11:16 Had a fall or experienced change in No Accompanied By: self activities of daily living that may affect Transfer Assistance: None risk of falls: Patient Identification Verified: Yes Signs or symptoms of abuse/neglect since last visito No Secondary Verification Process Completed: Yes Hospitalized since last visit: No Patient Requires Transmission-Based Precautions: No Implantable device outside of the clinic excluding No Patient Has Alerts: No cellular tissue based products placed in the center since last visit: Has Dressing in Place as Prescribed: Yes Pain Present Now: No Electronic Signature(s) Signed: 12/08/2019 12:55:23 PM By: [name] Entered By: [name] on 12/08/2019 11:17:00 -------------------------------------------------------------------------------- Clinic Level of Care Assessment Details Patient Name: Date of Service: [name], Melissa [name] 12/08/2019 11:00 A M Medical Record Number: [id number] Patient Account Number: [id number] Date of Birth/Sex: Treating [name]: [birth year] (44 y.o. [name] Primary Care Natividad Schlosser: [name] Other Clinician: Referring Lakeishia Truluck: Treating [name]/Extender: [name] in Treatment: 7 Clinic Level of Care Assessment Items TOOL 4 Quantity Score []  - 0 Use when  only an EandM is performed on FOLLOW-UP visit ASSESSMENTS - Nursing Assessment / Reassessment X- 1 10 Reassessment of Co-morbidities (includes updates in patient status) X- 1 5 Reassessment of Adherence to Treatment Plan ASSESSMENTS - Wound and Skin A ssessment / Reassessment X - Simple Wound Assessment / Reassessment - one wound 1 5 []  - 0 Complex Wound Assessment / Reassessment - multiple wounds []  - 0 Dermatologic / Skin Assessment (not related to wound area) ASSESSMENTS - Focused Assessment []  - 0 Circumferential Edema Measurements - multi extremities []  - 0 Nutritional Assessment / Counseling / Intervention []  - 0 Lower Extremity Assessment (monofilament, tuning fork, pulses) []  - 0 Peripheral Arterial Disease Assessment (using hand held doppler) ASSESSMENTS - Ostomy and/or Continence Assessment and Care []  - 0 Incontinence Assessment and Management []  - 0 Ostomy Care Assessment and Management (repouching, etc.) PROCESS - Coordination of Care X - Simple Patient / Family Education for ongoing care 1 15 []  - 0 Complex (extensive) Patient / Family Education for ongoing care X- 1 10 Staff obtains Programmer, systems, Records, T Results / Process Orders est X- 1 10 Staff telephones HHA, Nursing Homes / Clarify orders / etc []  - 0 Routine Transfer to another Facility (non-emergent condition) []  - 0 Routine Hospital Admission (non-emergent condition) []  - 0 New Admissions / Biomedical engineer / Ordering NPWT Apligraf, etc. , []  - 0 Emergency Hospital Admission (emergent condition) X- 1 10 Simple Discharge Coordination []  - 0 Complex (extensive) Discharge Coordination PROCESS - Special Needs []  - 0 Pediatric / Minor Patient Management []  - 0 Isolation Patient Management []  - 0 Hearing / Language / Visual special needs []  - 0 Assessment of Community assistance (transportation, D/C planning, etc.) []  - 0 Additional assistance / Altered mentation []  - 0 Support  Surface(s) Assessment (bed, cushion, seat, etc.) INTERVENTIONS - Wound Cleansing / Measurement X - Simple Wound Cleansing - one wound 1 5 []  - 0 Complex Wound  Cleansing - multiple wounds X- 1 5 Wound Imaging (photographs - any number of wounds) []  - 0 Wound Tracing (instead of photographs) X- 1 5 Simple Wound Measurement - one wound []  - 0 Complex Wound Measurement - multiple wounds INTERVENTIONS - Wound Dressings X - Small Wound Dressing one or multiple wounds 1 10 []  - 0 Medium Wound Dressing one or multiple wounds []  - 0 Large Wound Dressing one or multiple wounds X- 1 5 Application of Medications - topical []  - 0 Application of Medications - injection INTERVENTIONS - Miscellaneous []  - 0 External ear exam []  - 0 Specimen Collection (cultures, biopsies, blood, body fluids, etc.) []  - 0 Specimen(s) / Culture(s) sent or taken to Lab for analysis []  - 0 Patient Transfer (multiple staff / Civil Service fast streamer / Similar devices) []  - 0 Simple Staple / Suture removal (25 or less) []  - 0 Complex Staple / Suture removal (26 or more) []  - 0 Hypo / Hyperglycemic Management (close monitor of Blood Glucose) []  - 0 Ankle / Brachial Index (ABI) - do not check if billed separately X- 1 5 Vital Signs Has the patient been seen at the hospital within the last three years: Yes Total Score: 100 Level Of Care: New/Established - Level 3 Electronic Signature(s) Signed: 12/08/2019 6:20:53 PM By: [name], [name] Entered By: Melissa Gouty on 12/08/2019 11:55:03 -------------------------------------------------------------------------------- Lower Extremity Assessment Details Patient Name: Date of Service: Melissa Spray, MA [name] 12/08/2019 11:00 A M Medical Record Number: [id number] Patient Account Number: [id number] Date of Birth/Sex: Treating [name]: July 24, 1975 (44 y.o. [name] Primary Care Gayna Braddy: [name] Other Clinician: Referring Tyquon Near: Treating [name]/Extender:  [name] in Treatment: 7 Electronic Signature(s) Signed: 12/08/2019 5:54:53 PM By: [name] Entered By: [name] on 12/08/2019 11:26:15 -------------------------------------------------------------------------------- Multi Wound Chart Details Patient Name: Date of Service: Melissa Spray, MA [name] 12/08/2019 11:00 A M Medical Record Number: [id number] Patient Account Number: [id number] Date of Birth/Sex: Treating [name]: 1975/03/12 (44 y.o. [name] Primary Care Joshawa Dubin: [name] Other Clinician: Referring Sanaiya Welliver: Treating Shawnte Demarest/Extender: [name] in Treatment: 7 Vital Signs Height(in): 10 Pulse(bpm): 76 Weight(lbs): 235 Blood Pressure(mmHg): 120/63 Body Mass Index(BMI): 38 Temperature(F): 97.9 Respiratory Rate(breaths/min): 18 Photos: [1:No Photos Abdomen - midline] [N/A:N/A N/A] Wound Location: [1:Surgical Injury] [N/A:N/A] Wounding Event: [1:Open Surgical Wound] [N/A:N/A] Primary Etiology: [1:Anemia, Hypertension, Osteoarthritis,] [N/A:N/A] Comorbid History: [1:Confinement Anxiety 06/29/2019] [N/A:N/A] Date Acquired: [1:7] [N/A:N/A] Weeks of Treatment: [1:Open] [N/A:N/A] Wound Status: [1:3.4x1.5x3.5] [N/A:N/A] Measurements L x W x D (cm) [1:4.006] [N/A:N/A] A (cm) : rea [1:14.019] [N/A:N/A] Volume (cm) : [1:72.00%] [N/A:N/A] % Reduction in A rea: [1:83.10%] [N/A:N/A] % Reduction in Volume: [1:7] Starting Position 1 (o'clock): [1:4] Ending Position 1 (o'clock): [1:8.5] Maximum Distance 1 (cm): [1:Yes] [N/A:N/A] Undermining: [1:Full Thickness With Exposed Support] [N/A:N/A] Classification: [1:Structures Large] [N/A:N/A] Exudate Amount: [1:Purulent] [N/A:N/A] Exudate Type: [1:yellow, brown, green] [N/A:N/A] Exudate Color: [1:Yes] [N/A:N/A] Foul Odor A Cleansing: [1:fter No] [N/A:N/A] Odor Anticipated Due to Product Use: [1:Well defined, not attached] [N/A:N/A] Wound Margin: [1:Large  (67-100%)] [N/A:N/A] Granulation A mount: [1:Pink, Pale] [N/A:N/A] Granulation Quality: [1:None Present (0%)] [N/A:N/A] Necrotic Amount: [1:Fat Layer (Subcutaneous Tissue): Yes N/A] Exposed Structures: [1:Muscle: Yes Fascia: No Tendon: No Joint: No Bone: No Small (1-33%)] [N/A:N/A] Treatment Notes Electronic Signature(s) Signed: 12/08/2019 6:16:54 PM By: Melissa Ham MD Signed: 12/08/2019 6:20:53 PM By: [name], [name] Entered By: [name] on 12/08/2019 12:31:31 -------------------------------------------------------------------------------- Multi-Disciplinary Care Plan Details Patient Name: Date of  Service: Melissa Spray, MA [name] 12/08/2019 11:00 A M Medical Record Number: [id number] Patient Account Number: [id number] Date of Birth/Sex: Treating [name]: 1975/05/03 (44 y.o. [name] Primary Care Aliena Ghrist: [name] Other Clinician: Referring Holton Sidman: Treating Alija Riano/Extender: [name] in Treatment: 7 Active Inactive Nutrition Nursing Diagnoses: Potential for alteratiion in Nutrition/Potential for imbalanced nutrition Goals: Patient/caregiver agrees to and verbalizes understanding of need to use nutritional supplements and/or vitamins as prescribed Date Initiated: 10/16/2019 Target Resolution Date: 12/15/2019 Goal Status: Active Interventions: Assess patient nutrition upon admission and as needed per policy Provide education on nutrition Treatment Activities: Education provided on Nutrition : 11/24/2019 Notes: Wound/Skin Impairment Nursing Diagnoses: Impaired tissue integrity Knowledge deficit related to ulceration/compromised skin integrity Goals: Patient/caregiver will verbalize understanding of skin care regimen Date Initiated: 10/16/2019 Target Resolution Date: 12/15/2019 Goal Status: Active Ulcer/skin breakdown will have a volume reduction of 30% by week 4 Date Initiated: 10/16/2019 Target Resolution Date:  12/15/2019 Goal Status: Active Interventions: Assess patient/caregiver ability to obtain necessary supplies Assess patient/caregiver ability to perform ulcer/skin care regimen upon admission and as needed Assess ulceration(s) every visit Provide education on ulcer and skin care Notes: Electronic Signature(s) Signed: 12/08/2019 6:20:53 PM By: [name], [name] Entered By: Melissa Gouty on 12/08/2019 11:13:09 -------------------------------------------------------------------------------- Pain Assessment Details Patient Name: Date of Service: Melissa Spray, MA [name] 12/08/2019 11:00 A M Medical Record Number: [id number] Patient Account Number: [id number] Date of Birth/Sex: Treating [name]: [birth year] (44 y.o. [name] Primary Care Zared Knoth: [name] Other Clinician: Referring Chason Mciver: Treating Donaciano Range/Extender: [name] in Treatment: 7 Active Problems Location of Pain Severity and Description of Pain Patient Has Paino No Site Locations Pain Management and Medication Current Pain Management: Electronic Signature(s) Signed: 12/08/2019 12:55:23 PM By: [name] Signed: 12/08/2019 6:20:53 PM By: [name], [name] Entered By: [name] on 12/08/2019 11:18:32 -------------------------------------------------------------------------------- Patient/Caregiver Education Details Patient Name: Date of Service: Melissa Spray, MA [name] 10/15/2021andnbsp11:00 A M Medical Record Number: [id number] Patient Account Number: [id number] Date of Birth/Gender: Treating [name]: Feb 09, 1976 (44 y.o. [name] Primary Care Physician: [name] Other Clinician: Referring Physician: Treating Physician/Extender: [name] in Treatment: 7 Education Assessment Education Provided To: Patient Education Topics Provided Nutrition: Methods: Explain/Verbal Responses: Reinforcements needed, State  content correctly Wound/Skin Impairment: Methods: Explain/Verbal Responses: Reinforcements needed, State content correctly Electronic Signature(s) Signed: 12/08/2019 6:20:53 PM By: [name], [name] Entered By: Melissa Gouty on 12/08/2019 11:13:32 -------------------------------------------------------------------------------- Wound Assessment Details Patient Name: Date of Service: Melissa Spray, MA [name] 12/08/2019 11:00 A M Medical Record Number: [id number] Patient Account Number: [id number] Date of Birth/Sex: Treating [name]: [birth year] (45 y.o. [name] Primary Care Hadrian Yarbrough: [name] Other Clinician: Referring Malichi Palardy: Treating Jaymie Mckiddy/Extender: [name] in Treatment: 7 Wound Status Wound Number: 1 Primary Etiology: Open Surgical Wound Wound Location: Abdomen - midline Wound Status: Open Wounding Event: Surgical Injury Comorbid History: Anemia, Hypertension, Osteoarthritis, Confinement Anxiety Date Acquired: 06/29/2019 Weeks Of Treatment: 7 Clustered Wound: No Wound Measurements Length: (cm) 3.4 Width: (cm) 1.5 Depth: (cm) 3.5 Area: (cm) 4.006 Volume: (cm) 14.019 % Reduction in Area: 72% % Reduction in Volume: 83.1% Epithelialization: Small (1-33%) Tunneling: No Undermining: Yes Starting Position (o'clock): 7 Ending Position (o'clock): 4 Maximum Distance: (cm) 8.5 Wound Description Classification: Full Thickness With Exposed Support Structures Wound Margin: Well defined, not attached Exudate Amount: Large Exudate Type: Purulent Exudate Color: yellow, brown, green Foul Odor After Cleansing:  Yes Due to Product Use: No Slough/Fibrino No Wound Bed Granulation Amount: Large (67-100%) Exposed Structure Granulation Quality: Pink, Pale Fascia Exposed: No Necrotic Amount: None Present (0%) Fat Layer (Subcutaneous Tissue) Exposed: Yes Tendon Exposed: No Muscle Exposed: Yes Necrosis of Muscle: No Joint Exposed:  No Bone Exposed: No Electronic Signature(s) Signed: 12/08/2019 5:54:53 PM By: [name] Entered By: [name] on 12/08/2019 11:28:57 -------------------------------------------------------------------------------- Vitals Details Patient Name: Date of Service: Melissa Spray, MA [name] 12/08/2019 11:00 A M Medical Record Number: [id number] Patient Account Number: [id number] Date of Birth/Sex: Treating [name]: March 02, 1975 (44 y.o. [name] Primary Care Catherin Doorn: [name] Other Clinician: Referring Gianfranco Araki: Treating Novalyn Lajara/Extender: [name] in Treatment: 7 Vital Signs Time Taken: 11:18 Temperature (F): 97.9 Height (in): 66 Pulse (bpm): 89 Weight (lbs): 235 Respiratory Rate (breaths/min): 18 Body Mass Index (BMI): 37.9 Blood Pressure (mmHg): 120/63 Reference Range: 80 - 120 mg / dl Electronic Signature(s) Signed: 12/08/2019 12:55:23 PM By: [name] Entered By: [name] on 12/08/2019 11:18:24

## 2019-12-08 NOTE — Progress Notes (Signed)
Melissa, [name] ([id number]) Visit Report for 12/08/2019 HPI Details Patient Name: Date of Service: [name], [place] Melissa J. 12/08/2019 11:00 A M Medical Record Number: [id number] Patient Account Number: [id number] Date of Birth/Sex: Treating RN: 02-Sep-1975 (44 y.o. [name] Primary Care Provider: Jenna [name] Other Clinician: Referring Provider: Treating Provider/Extender: [name] in Treatment: 7 History of Present Illness HPI Description: ADMISSION 10/16/2019 This is a 44 year old woman who apparently had a perforated diverticulum in the mid 2000's. She required a colostomy and then reversal. She did well up until 2009 she required an incisional hernia repair at an outside hospital. She had a gastric sleeve procedure as well in 2015. Sometime after this she developed an abdominal seroma. She had abdominal drains as initial management. She had drainage of the seroma cavity in 2016 in the OR. This resulted in resolution of the seroma cavity for a long period of time. She represented in December of last year year with a large amount of fluid. Her last CT scan was in April noted that she had a small residual fluid collection. However because of continued drainage it was elected that she would go ahead with a surgical procedure for debridement of the seroma cavity. She was taken to the OR on Jun 29, 2019. The measurements of the area were 15 x 15 x 3 at that point. A wound VAC was placed postoperatively. She tells me she loses a lot of blood in the wound VAC and she is iron deficient. Last hemoglobin I see was 11.9 on 06/27/2019. Her albumin was normal. The patient comes in the clinic here very frustrated with the lack of progress. She says the wound is not healed at all with the wound VAC. She continues to have what she describes as a large amount of sanguinous drainage and she changes the canister every day. She is not having any pain or fever. She  is wondering what her options are given the fact that she believes the wound VAC to be ineffective. Past medical history includes bipolar disorder, sarcoidosis, TIA, perforated diverticulum in the mid 2000's, incisional hernia repair in 2009, gastric sleeve procedure in 2015, hypertension 10/23/2019; there has been some improvement in her dimensions. She still changes the canister every 30 hours. I have not cultured this but there is an odor. Current dimensions are 5.5 x 2.5 x 4.2 with 6.5 cm of maximum undermining superiorly. 9/17; 2 week follow-up. The patient is using a wound VAC. She is able to show me her sanguinous drainage in the canister. She also reports that when the wound VAC is off for any period of time she develops fever up to 100. Sodium note she was referred to hematology for chronic anemia. It was felt that this is iron deficiency probably secondary to poor iron absorption. Her hemoglobin was measured at 10 with low MCH and MCHC at 25.2 with 29.9 respectively. Platelets slightly up at 459 iron was 16, TIBC at 254. Saturation 6 ferritin at 63 B12 287 10/1; 2-week follow-up. For the first time we have improvement in her dimensions the depth is improved and some of the maximal undermining. Surface of the wound looks better. When she tells me she often turns her wound VAC off at night because she does not like the alarm. She has an appointment with Dr. Claudia [name] sometime in mid October. This referral was put in by Dr. Redmond [name] her general surgeon 10/15; patient's appointment with Dr. Claudia [name] is next week. I have  been looking at this wound for a little less than 2 months now. I had initially convinced her to retry the wound VAC which she was reluctant to do because of perceived lack of improvement although at the time I was not sure she had use this consistently enough. She says that she gets a bloody fluid making her change to her canister every 30 hours although I have never seen any blood in  the surface as this wound. The dimensions of come in slightly but certainly not dramatically. The undermining area has made no consistent improvements perhaps 2 cm improvement in wound depth Electronic Signature(s) Signed: 12/08/2019 6:16:54 PM By: Melissa Ham MD Entered By: [name] on 12/08/2019 12:33:32 -------------------------------------------------------------------------------- Physical Exam Details Patient Name: Date of Service: Melissa Spray, [place] Melissa J. 12/08/2019 11:00 A M Medical Record Number: [id number] Patient Account Number: [id number] Date of Birth/Sex: Treating RN: 29-Aug-1975 (44 y.o. [name] Primary Care Provider: Jenna [name] Other Clinician: Referring Provider: Treating Provider/Extender: [name] in Treatment: 7 Constitutional Sitting or standing Blood Pressure is within target range for patient.. Pulse regular and within target range for patient.Marland Kitchen Respirations regular, non-labored and within target range.. Temperature is normal and within the target range for the patient.Marland Kitchen Appears in no distress. Gastrointestinal (GI) Abdomen is soft and non-distended without masses or tenderness.. No liver or spleen enlargement. Notes Wound exam; deep surgical wound. I think the surface of this is largely unchanged, slightly lifeless looking. There is absolutely no active bleeding here no drainage. The depth may have come in somewhat but the undermining area superiorly is still extensive. No surrounding tenderness. Electronic Signature(s) Signed: 12/08/2019 6:16:54 PM By: Melissa Ham MD Entered By: [name] on 12/08/2019 12:34:30 -------------------------------------------------------------------------------- Physician Orders Details Patient Name: Date of Service: Melissa Spray, MA Melissa J. 12/08/2019 11:00 A M Medical Record Number: [id number] Patient Account Number: [id number] Date of Birth/Sex: Treating RN: [birth year] (44  y.o. [name] Primary Care Provider: Jenna [name] Other Clinician: Referring Provider: Treating Provider/Extender: [name] in Treatment: 7 Verbal / Phone Orders: No Diagnosis Coding ICD-10 Coding Code Description T81.31XA Disruption of external operation (surgical) wound, not elsewhere classified, initial encounter K91.873 Postprocedural seroma of a digestive system organ or structure following other procedure S31.105S Unspecified open wound of abdominal wall, periumbilic region without penetration into peritoneal cavity, sequela Follow-up Appointments Return Appointment in 2 weeks. Dressing Change Frequency Wound #1 Abdomen - midline Change dressing three times week. - by home health Skin Barriers/Peri-Wound Care Wound #1 Abdomen - midline Skin Prep Wound Cleansing Wound #1 Abdomen - midline Clean wound with Wound Cleanser - or normal saline Primary Wound Dressing Wound #1 Abdomen - midline Other: - wet-to-dry today in clinic, home health to apply wound vac Secondary Dressing Wound #1 Abdomen - midline Foam Border - or ABD in clinic Negative Presssure Wound Therapy Wound #1 Abdomen - midline Wound Vac to wound continuously at 176mm/hg pressure Black and White Foam combination - white foam in undermining/tunneling areas [place] skilled [facility] for wound care. - [facility] Electronic Signature(s) Signed: 12/08/2019 6:16:54 PM By: Melissa Ham MD Signed: 12/08/2019 6:20:53 PM By: Baruch Gouty RN, BSN Entered By: Baruch Gouty on 12/08/2019 11:56:45 -------------------------------------------------------------------------------- Problem List Details Patient Name: Date of Service: Melissa Spray, MA Melissa J. 12/08/2019 11:00 A M Medical Record Number: [id number] Patient Account Number: [id number] Date of Birth/Sex: Treating RN: 1975/03/29 (44 y.o. [name] Primary Care Provider: Dennard Schaumann,  Cletus Gash Other Clinician: Referring Provider: Treating Provider/Extender: [name] in Treatment: 7 Active Problems ICD-10 Encounter Code Description Active Date MDM Diagnosis T81.31XA Disruption of external operation (surgical) wound, not elsewhere classified, 10/16/2019 No Yes initial encounter K91.873 Postprocedural seroma of a digestive system organ or structure following 10/16/2019 No Yes other procedure S31.105S Unspecified open wound of abdominal wall, periumbilic region without 7/78/2423 No Yes penetration into peritoneal cavity, sequela Inactive Problems Resolved Problems Electronic Signature(s) Signed: 12/08/2019 6:16:54 PM By: Melissa Ham MD Entered By: [name] on 12/08/2019 12:31:22 -------------------------------------------------------------------------------- Progress Note Details Patient Name: Date of Service: Melissa Spray, MA Melissa J. 12/08/2019 11:00 A M Medical Record Number: [id number] Patient Account Number: [id number] Date of Birth/Sex: Treating RN: 21-May-1975 (44 y.o. [name] Primary Care Provider: Jenna [name] Other Clinician: Referring Provider: Treating Provider/Extender: [name] in Treatment: 7 Subjective History of Present Illness (HPI) ADMISSION 10/16/2019 This is a 44 year old woman who apparently had a perforated diverticulum in the mid 2000's. She required a colostomy and then reversal. She did well up until 2009 she required an incisional hernia repair at an outside hospital. She had a gastric sleeve procedure as well in 2015. Sometime after this she developed an abdominal seroma. She had abdominal drains as initial management. She had drainage of the seroma cavity in 2016 in the OR. This resulted in resolution of the seroma cavity for a long period of time. She represented in December of last year year with a large amount of fluid. Her last CT scan was in April  noted that she had a small residual fluid collection. However because of continued drainage it was elected that she would go ahead with a surgical procedure for debridement of the seroma cavity. She was taken to the OR on Jun 29, 2019. The measurements of the area were 15 x 15 x 3 at that point. A wound VAC was placed postoperatively. She tells me she loses a lot of blood in the wound VAC and she is iron deficient. Last hemoglobin I see was 11.9 on 06/27/2019. Her albumin was normal. The patient comes in the clinic here very frustrated with the lack of progress. She says the wound is not healed at all with the wound VAC. She continues to have what she describes as a large amount of sanguinous drainage and she changes the canister every day. She is not having any pain or fever. She is wondering what her options are given the fact that she believes the wound VAC to be ineffective. Past medical history includes bipolar disorder, sarcoidosis, TIA, perforated diverticulum in the mid 2000's, incisional hernia repair in 2009, gastric sleeve procedure in 2015, hypertension 10/23/2019; there has been some improvement in her dimensions. She still changes the canister every 30 hours. I have not cultured this but there is an odor. Current dimensions are 5.5 x 2.5 x 4.2 with 6.5 cm of maximum undermining superiorly. 9/17; 2 week follow-up. The patient is using a wound VAC. She is able to show me her sanguinous drainage in the canister. She also reports that when the wound VAC is off for any period of time she develops fever up to 100. Sodium note she was referred to hematology for chronic anemia. It was felt that this is iron deficiency probably secondary to poor iron absorption. Her hemoglobin was measured at 10 with low MCH and MCHC at 25.2 with 29.9 respectively. Platelets slightly up at 459 iron was 16, TIBC at 254.  Saturation 6 ferritin at 63 B12 287 10/1; 2-week follow-up. For the first time we have improvement  in her dimensions the depth is improved and some of the maximal undermining. Surface of the wound looks better. When she tells me she often turns her wound VAC off at night because she does not like the alarm. She has an appointment with Dr. Claudia [name] sometime in mid October. This referral was put in by Dr. Redmond [name] her general surgeon 10/15; patient's appointment with Dr. Claudia [name] is next week. I have been looking at this wound for a little less than 2 months now. I had initially convinced her to retry the wound VAC which she was reluctant to do because of perceived lack of improvement although at the time I was not sure she had use this consistently enough. She says that she gets a bloody fluid making her change to her canister every 30 hours although I have never seen any blood in the surface as this wound. The dimensions of come in slightly but certainly not dramatically. The undermining area has made no consistent improvements perhaps 2 cm improvement in wound depth Objective Constitutional Sitting or standing Blood Pressure is within target range for patient.. Pulse regular and within target range for patient.Marland Kitchen Respirations regular, non-labored and within target range.. Temperature is normal and within the target range for the patient.Marland Kitchen Appears in no distress. Vitals Time Taken: 11:18 AM, Height: 66 in, Weight: 235 lbs, BMI: 37.9, Temperature: 97.9 F, Pulse: 89 bpm, Respiratory Rate: 18 breaths/min, Blood Pressure: 120/63 mmHg. Gastrointestinal (GI) Abdomen is soft and non-distended without masses or tenderness.. No liver or spleen enlargement. General Notes: Wound exam; deep surgical wound. I think the surface of this is largely unchanged, slightly lifeless looking. There is absolutely no active bleeding here no drainage. The depth may have come in somewhat but the undermining area superiorly is still extensive. No surrounding tenderness. Integumentary (Hair, Skin) Wound #1 status is Open. Original  cause of wound was Surgical Injury. The wound is located on the Abdomen - midline. The wound measures 3.4cm length x 1.5cm width x 3.5cm depth; 4.006cm^2 area and 14.019cm^3 volume. There is muscle and Fat Layer (Subcutaneous Tissue) exposed. There is no tunneling noted, however, there is undermining starting at 7:00 and ending at 4:00 with a maximum distance of 8.5cm. There is a large amount of purulent drainage noted. Foul odor after cleansing was noted. The wound margin is well defined and not attached to the wound base. There is large (67-100%) pink, pale granulation within the wound bed. There is no necrotic tissue within the wound bed. Assessment Active Problems ICD-10 Disruption of external operation (surgical) wound, not elsewhere classified, initial encounter Postprocedural seroma of a digestive system organ or structure following other procedure Unspecified open wound of abdominal wall, periumbilic region without penetration into peritoneal cavity, sequela Plan Follow-up Appointments: Return Appointment in 2 weeks. Dressing Change Frequency: Wound #1 Abdomen - midline: Change dressing three times week. - by home health Skin Barriers/Peri-Wound Care: Wound #1 Abdomen - midline: Skin Prep Wound Cleansing: Wound #1 Abdomen - midline: Clean wound with Wound Cleanser - or normal saline Primary Wound Dressing: Wound #1 Abdomen - midline: Other: - wet-to-dry today in clinic, home health to apply wound vac Secondary Dressing: Wound #1 Abdomen - midline: Foam Border - or ABD in clinic Negative Presssure Wound Therapy: Wound #1 Abdomen - midline: Wound Vac to wound continuously at 168mm/hg pressure Black and White Foam combination - white foam in undermining/tunneling areas  Home Health: [place] skilled [facility] for wound care. - [facility] #1 certainly not a major improvement in this wound with 2 months of using the wound VAC. 2. I have always been somewhat suspicious of  the bleeding she describes in the canister since I never see any evidence of bleeding when she is here. I wonder if this is sanguinous drainage rather than frank bleeding 3. Nevertheless she has received iron infusions. She was worked up for GI sources by Dr. Fuller Plan apparently had precancerous polyps and gastritis. 4. The patient expresses upset about the duration of time that she has been working with his surgical wound coming up on 10 months now. I will look at Dr. Keane Scrape notes on this to see if he has any other suggestions for an attempt at closure either surgically or with other suggestions. She went to see Dr. Redmond [name] on 11/30/2019. Electronic Signature(s) Signed: 12/08/2019 6:16:54 PM By: Melissa Ham MD Entered By: [name] on 12/08/2019 12:37:36 -------------------------------------------------------------------------------- SuperBill Details Patient Name: Date of Service: Melissa Spray, MA Melissa J. 12/08/2019 Medical Record Number: [id number] Patient Account Number: [id number] Date of Birth/Sex: Treating RN: Dec 25, 1975 (44 y.o. [name] Primary Care Provider: Jenna [name] Other Clinician: Referring Provider: Treating Provider/Extender: [name] in Treatment: 7 Diagnosis Coding ICD-10 Codes Code Description T81.31XA Disruption of external operation (surgical) wound, not elsewhere classified, initial encounter K91.873 Postprocedural seroma of a digestive system organ or structure following other procedure S31.105S Unspecified open wound of abdominal wall, periumbilic region without penetration into peritoneal cavity, sequela Facility Procedures CPT4 Code: [id number] Description: 99213 - WOUND CARE VISIT-LEV 3 EST PT Modifier: Quantity: 1 Physician Procedures : CPT4 Code Description Modifier [id number] - WC PHYS LEVEL 3 - EST PT ICD-10 Diagnosis Description T81.31XA Disruption of external operation (surgical) wound, not elsewhere  classified, initial encounter K91.873 Postprocedural seroma of a digestive  system organ or structure following other procedure S31.105S Unspecified open wound of abdominal wall, periumbilic region without penetration into peritoneal cavi Quantity: 1 ty, sequela Electronic Signature(s) Signed: 12/08/2019 6:16:54 PM By: Melissa Ham MD Entered By: [name] on 12/08/2019 12:38:41

## 2019-12-11 DIAGNOSIS — F419 Anxiety disorder, unspecified: Secondary | ICD-10-CM | POA: Diagnosis not present

## 2019-12-11 DIAGNOSIS — D869 Sarcoidosis, unspecified: Secondary | ICD-10-CM | POA: Diagnosis not present

## 2019-12-11 DIAGNOSIS — D509 Iron deficiency anemia, unspecified: Secondary | ICD-10-CM | POA: Diagnosis not present

## 2019-12-11 DIAGNOSIS — F209 Schizophrenia, unspecified: Secondary | ICD-10-CM | POA: Diagnosis not present

## 2019-12-11 DIAGNOSIS — K91872 Postprocedural seroma of a digestive system organ or structure following a digestive system procedure: Secondary | ICD-10-CM | POA: Diagnosis not present

## 2019-12-11 DIAGNOSIS — F319 Bipolar disorder, unspecified: Secondary | ICD-10-CM | POA: Diagnosis not present

## 2019-12-12 ENCOUNTER — Other Ambulatory Visit: Payer: Self-pay

## 2019-12-12 ENCOUNTER — Ambulatory Visit (INDEPENDENT_AMBULATORY_CARE_PROVIDER_SITE_OTHER): Payer: Medicare Other | Admitting: Family Medicine

## 2019-12-12 VITALS — BP 130/80 | HR 113 | Temp 97.9°F | Ht 66.0 in | Wt 242.0 lb

## 2019-12-12 DIAGNOSIS — D869 Sarcoidosis, unspecified: Secondary | ICD-10-CM | POA: Diagnosis not present

## 2019-12-12 DIAGNOSIS — J069 Acute upper respiratory infection, unspecified: Secondary | ICD-10-CM | POA: Diagnosis not present

## 2019-12-12 DIAGNOSIS — J0100 Acute maxillary sinusitis, unspecified: Secondary | ICD-10-CM

## 2019-12-12 MED ORDER — AMOXICILLIN-POT CLAVULANATE 875-125 MG PO TABS: 1.0000 | ORAL_TABLET | Freq: Two times a day (BID) | ORAL | 0 refills | Status: DC

## 2019-12-12 MED ORDER — PREDNISONE 20 MG PO TABS: ORAL_TABLET | ORAL | 0 refills | Status: DC

## 2019-12-12 NOTE — Progress Notes (Signed)
Subjective:    Patient ID: [name], female    DOB: 31-Oct-1975, 44 y.o.   MRN: [id number]  HPI  Patient is a 44 year old Caucasian female with a history of sarcoidosis.  Approximately 4 weeks ago she developed an upper respiratory infection with head congestion and cough.  She called her pulmonologist and she was started on prednisone which seemed to help the cough and the wheezing.  However after stopping the prednisone the cough returned.  She has now developed head congestion.  She reports pain and pressure in both maxillary sinuses.  She reports postnasal drip.  She states that her upper teeth throb and hurt.  Symptoms of been present now for approximately 3 weeks.  She went to her OB doctor last week for a physical and was found to have a fever to 100.1.  Therefore they were unable to see her.  Her pulmonologist cannot see her in the office due to her fever prompting her to make the appointment here today.  On exam she does have some faint expiratory wheezes but otherwise there are no crackles or rails.  She does have severe tenderness to palpation in both maxillary sinuses and audible head congestion changing her tone of voice. Past Medical History:  Diagnosis Date  . ADHD   . Anal fistula   . Anxiety   . Arthritis    pt denies 5/3 visit   . Bipolar affective ([place])   . Blood transfusion without reported diagnosis   . Colostomy in place [facility])   . CVA (cerebrovascular accident) ([place])    left cerebellar infarct found accidentally on CT (2020)   . Depression   . Diverticulitis 2008   perforated/ requiring resection  . Fatty liver   . Gallstones   . GERD (gastroesophageal reflux disease)    occ.  Marland Kitchen Headache    after a fall  . Herpes simplex   . History of colon polyps   . History of hiatal hernia    small  . History of kidney stones    cystoscopy  basket removal  . Hypertension   . Iron deficiency anemia   . Obese   . OCD (obsessive compulsive disorder)   . Panic  attacks   . Pulmonary nodules   . Sarcoidosis   . Schizophrenia ([place])   . Shortness of breath dyspnea    pt denies on 5/3 visit   . Sleep apnea    mild, does not use c-pap machine  . Substance abuse ([place])    12 years ago-crack cocaine   Current Outpatient Medications on File Prior to Visit  Medication Sig Dispense Refill  . amphetamine-dextroamphetamine (ADDERALL) 15 MG tablet Take 15 mg by mouth 3 (three) times daily.     . Ascorbic Acid (VITAMIN C PO) Take by mouth.    Marland Kitchen atenolol (TENORMIN) 25 MG tablet Take 25 mg by mouth 2 (two) times daily.     Marland Kitchen atorvastatin (LIPITOR) 40 MG tablet TAKE 1 TABLET BY MOUTH EVERY DAY 90 tablet 1  . clopidogrel (PLAVIX) 75 MG tablet Take 75 mg by mouth daily.    . diazepam (VALIUM) 2 MG tablet Take 2 mg by mouth every 6 (six) hours as needed for anxiety.    . ferrous sulfate 325 (65 FE) MG tablet Take 325 mg by mouth daily with breakfast.    . fexofenadine (ALLEGRA) 180 MG tablet Take 180 mg by mouth daily as needed for rhinitis (sore throat). (Patient not taking: Reported on  09/08/2019)    . gabapentin (NEURONTIN) 300 MG capsule Take 300 mg by mouth 3 (three) times daily.    . Multiple Vitamins-Minerals (MULTIVITAMIN WITH MINERALS) tablet Take 1 tablet by mouth daily.     Marland Kitchen NUVIGIL 250 MG tablet Take 250 mg by mouth daily.  5  . ondansetron (ZOFRAN ODT) 4 MG disintegrating tablet Take 1 tablet (4 mg total) by mouth every 8 (eight) hours as needed for nausea or vomiting. 20 tablet 0  . pantoprazole (PROTONIX) 40 MG tablet Take 1 tablet (40 mg total) by mouth daily. 30 tablet 11  . QUEtiapine (SEROQUEL) 300 MG tablet Take 600 mg by mouth at bedtime.     . topiramate (TOPAMAX) 100 MG tablet Take 100 mg by mouth at bedtime.     . traMADol (ULTRAM) 50 MG tablet Take 100 mg by mouth every 6 (six) hours as needed.    . valACYclovir (VALTREX) 500 MG tablet TAKE 1 TABLET (500 MG TOTAL) BY MOUTH DAILY AS NEEDED (FEVER BLISTER). 90 tablet 2   Current  Facility-Administered Medications on File Prior to Visit  Medication Dose Route Frequency Provider Last Rate Last Admin  . cyanocobalamin ((VITAMIN B-12)) injection 1,000 mcg  1,000 mcg Intramuscular Q30 days Susy Frizzle, MD   1,000 mcg at 07/25/19 1041   No Known Allergies Social History   Socioeconomic History  . Marital status: Single    Spouse name: Not on file  . Number of children: 2  . Years of education: 22   . Highest education level: Not on file  Occupational History  . Occupation: Insurance claims handler: UNEMPLOYED  Tobacco Use  . Smoking status: Former Smoker    Packs/day: 0.50    Years: 14.00    Pack years: 7.00    Types: E-cigarettes    Quit date: 12/20/2013    Years since quitting: 5.9  . Smokeless tobacco: Never Used  . Tobacco comment: Vapor cigarettes.  Vaping Use  . Vaping Use: Every day  . Last attempt to quit: 02/24/2019  . Substances: Nicotine, Flavoring  . Devices: started 2015  Substance and Sexual Activity  . Alcohol use: No    Alcohol/week: 0.0 standard drinks  . Drug use: Never  . Sexual activity: Not Currently    Birth control/protection: Abstinence  Other Topics Concern  . Not on file  Social History Narrative  . Not on file   Social Determinants of Health   Financial Resource Strain:   . Difficulty of Paying Living Expenses: Not on file  Food Insecurity:   . Worried About Charity fundraiser in the Last Year: Not on file  . Ran Out of Food in the Last Year: Not on file  Transportation Needs:   . Lack of Transportation (Medical): Not on file  . Lack of Transportation (Non-Medical): Not on file  Physical Activity:   . Days of Exercise per Week: Not on file  . Minutes of Exercise per Session: Not on file  Stress:   . Feeling of Stress : Not on file  Social Connections:   . Frequency of Communication with Friends and Family: Not on file  . Frequency of Social Gatherings with Friends and Family: Not on file  . Attends  Religious Services: Not on file  . Active Member of Clubs or Organizations: Not on file  . Attends Archivist Meetings: Not on file  . Marital Status: Not on file  Intimate Partner Violence:   .  Fear of Current or Ex-Partner: Not on file  . Emotionally Abused: Not on file  . Physically Abused: Not on file  . Sexually Abused: Not on file     Review of Systems  All other systems reviewed and are negative.      Objective:   Physical Exam Vitals reviewed.  Constitutional:      Appearance: She is obese.  HENT:     Right Ear: Tympanic membrane and ear canal normal.     Left Ear: Tympanic membrane and ear canal normal.     Nose: Congestion and rhinorrhea present.     Right Sinus: Maxillary sinus tenderness present.     Left Sinus: Maxillary sinus tenderness present.  Eyes:     Conjunctiva/sclera: Conjunctivae normal.  Cardiovascular:     Rate and Rhythm: Normal rate and regular rhythm.     Heart sounds: Normal heart sounds.  Pulmonary:     Effort: Pulmonary effort is normal. No respiratory distress.     Breath sounds: Wheezing present.  Neurological:     Mental Status: She is alert.           Assessment & Plan:  URI, acute - Plan: SARS-COV-2 RNA,(COVID-19) QUAL NAAT  Acute non-recurrent maxillary sinusitis  Sarcoidosis  I believe the patient likely had a viral upper respiratory infection that is exacerbating her sarcoidosis causing the cough and wheezing.  I believe she has developed a secondary sinus infection.  I will treat the sinus infection with Augmentin 875 mg twice daily for 10 days.  I will treat the bronchospasm and wheezing with a prednisone taper pack coupled with albuterol 2 puffs every 6 hours as needed.  I will screen the patient for Covid however her symptoms have been present now for more than 3 weeks and she had both doses of the Moderna Covid vaccine.

## 2019-12-12 NOTE — Patient Instructions (Signed)
° ° ° °  If you have lab work done today you will be contacted with your lab results within the next 2 weeks.  If you have not heard from us then please contact us. The fastest way to get your results is to register for My Chart. ° ° °IF you received an x-ray today, you will receive an invoice from [facility]. Please contact [facility] at [phone] with questions or concerns regarding your invoice.  ° °IF you received labwork today, you will receive an invoice from [facility]. Please contact [facility] at [phone] with questions or concerns regarding your invoice.  ° °Our billing staff will not be able to assist you with questions regarding bills from these companies. ° °You will be contacted with the lab results as soon as they are available. The fastest way to get your results is to activate your My Chart account. Instructions are located on the last page of this paperwork. If you have not heard from us regarding the results in 2 weeks, please contact this office. °  ° ° ° °

## 2019-12-13 ENCOUNTER — Ambulatory Visit (INDEPENDENT_AMBULATORY_CARE_PROVIDER_SITE_OTHER): Payer: Medicare Other | Admitting: Plastic Surgery

## 2019-12-13 ENCOUNTER — Encounter: Payer: Self-pay | Admitting: Plastic Surgery

## 2019-12-13 VITALS — BP 142/85 | HR 97 | Temp 98.4°F | Ht 66.0 in | Wt 242.0 lb

## 2019-12-13 DIAGNOSIS — S31109A Unspecified open wound of abdominal wall, unspecified quadrant without penetration into peritoneal cavity, initial encounter: Secondary | ICD-10-CM | POA: Diagnosis not present

## 2019-12-13 DIAGNOSIS — K91872 Postprocedural seroma of a digestive system organ or structure following a digestive system procedure: Secondary | ICD-10-CM | POA: Diagnosis not present

## 2019-12-13 DIAGNOSIS — F319 Bipolar disorder, unspecified: Secondary | ICD-10-CM | POA: Diagnosis not present

## 2019-12-13 DIAGNOSIS — F419 Anxiety disorder, unspecified: Secondary | ICD-10-CM | POA: Diagnosis not present

## 2019-12-13 DIAGNOSIS — D869 Sarcoidosis, unspecified: Secondary | ICD-10-CM | POA: Diagnosis not present

## 2019-12-13 DIAGNOSIS — F209 Schizophrenia, unspecified: Secondary | ICD-10-CM | POA: Diagnosis not present

## 2019-12-13 DIAGNOSIS — D509 Iron deficiency anemia, unspecified: Secondary | ICD-10-CM | POA: Diagnosis not present

## 2019-12-13 LAB — SARS-COV-2 RNA,(COVID-19) QUALITATIVE NAAT: SARS CoV2 RNA: NOT DETECTED

## 2019-12-13 NOTE — Progress Notes (Signed)
Referring Provider Susy Frizzle, MD [address] [place],  [place] [address]   CC: No chief complaint on file.     [name] is an 44 y.o. female.  HPI: Patient presents with a chronic wound of her abdomen.  Patient has had issues with her abdominal wall for over 12 years.  She had perforated diverticulitis sometime ago and had a Hartman's procedure followed by colostomy reversal.  Subsequent to that she had an ventral hernia repair done without mesh which was complicated by a seroma postoperatively.  This was drained multiple times and at some point surgically excised but recurred.  She has been getting wound VAC and dressing and also seeing the wound care team.  She sent to me to see if anything could be done surgically for this.  No Known Allergies  Outpatient Encounter Medications as of 12/13/2019  Medication Sig Note  . albuterol (ACCUNEB) 0.63 MG/3ML nebulizer solution Take 1 ampule by nebulization every 6 (six) hours as needed for wheezing.   Marland Kitchen amoxicillin-clavulanate (AUGMENTIN) 875-125 MG tablet Take 1 tablet by mouth 2 (two) times daily.   Marland Kitchen amphetamine-dextroamphetamine (ADDERALL) 15 MG tablet Take 15 mg by mouth 3 (three) times daily.    . Ascorbic Acid (VITAMIN C PO) Take by mouth.   Marland Kitchen atenolol (TENORMIN) 25 MG tablet Take 25 mg by mouth 2 (two) times daily.    Marland Kitchen atorvastatin (LIPITOR) 40 MG tablet TAKE 1 TABLET BY MOUTH EVERY DAY   . clopidogrel (PLAVIX) 75 MG tablet Take 75 mg by mouth daily.   . diazepam (VALIUM) 2 MG tablet Take 2 mg by mouth every 6 (six) hours as needed for anxiety.   . ferrous sulfate 325 (65 FE) MG tablet Take 325 mg by mouth daily with breakfast.   . fexofenadine (ALLEGRA) 180 MG tablet Take 180 mg by mouth daily as needed for rhinitis (sore throat). (Patient not taking: Reported on 09/08/2019) 11/09/2019: As needed  . gabapentin (NEURONTIN) 300 MG capsule Take 300 mg by mouth 3 (three) times daily. 11/09/2019: Takes 600 mg 2 x a day    . Multiple Vitamins-Minerals (MULTIVITAMIN WITH MINERALS) tablet Take 1 tablet by mouth daily.    Marland Kitchen NUVIGIL 250 MG tablet Take 250 mg by mouth daily.   . ondansetron (ZOFRAN ODT) 4 MG disintegrating tablet Take 1 tablet (4 mg total) by mouth every 8 (eight) hours as needed for nausea or vomiting.   . pantoprazole (PROTONIX) 40 MG tablet Take 1 tablet (40 mg total) by mouth daily.   . predniSONE (DELTASONE) 20 MG tablet 3 tabs poqday 1-2, 2 tabs poqday 3-4, 1 tab poqday 5-6   . QUEtiapine (SEROQUEL) 300 MG tablet Take 600 mg by mouth at bedtime.    . topiramate (TOPAMAX) 100 MG tablet Take 100 mg by mouth at bedtime.    . traMADol (ULTRAM) 50 MG tablet Take 100 mg by mouth every 6 (six) hours as needed.   . valACYclovir (VALTREX) 500 MG tablet TAKE 1 TABLET (500 MG TOTAL) BY MOUTH DAILY AS NEEDED (FEVER BLISTER).    Facility-Administered Encounter Medications as of 12/13/2019  Medication  . cyanocobalamin ((VITAMIN B-12)) injection 1,000 mcg     Past Medical History:  Diagnosis Date  . ADHD   . Anal fistula   . Anxiety   . Arthritis    pt denies 5/3 visit   . Bipolar affective ([place])   . Blood transfusion without reported diagnosis   . Colostomy  in place [facility])   . CVA (cerebrovascular accident) ([place])    left cerebellar infarct found accidentally on CT (2020)   . Depression   . Diverticulitis 2008   perforated/ requiring resection  . Fatty liver   . Gallstones   . GERD (gastroesophageal reflux disease)    occ.  Marland Kitchen Headache    after a fall  . Herpes simplex   . History of colon polyps   . History of hiatal hernia    small  . History of kidney stones    cystoscopy  basket removal  . Hypertension   . Iron deficiency anemia   . Obese   . OCD (obsessive compulsive disorder)   . Panic attacks   . Pulmonary nodules   . Sarcoidosis   . Schizophrenia ([place])   . Shortness of breath dyspnea    pt denies on 5/3 visit   . Sleep apnea    mild, does not use c-pap machine  .  Substance abuse ([place])    12 years ago-crack cocaine    Past Surgical History:  Procedure Laterality Date  . Abdominal wall hernia  07/2007   open repair with lysis of adhesions  . APPLICATION OF WOUND VAC N/A 06/29/2019   Procedure: PLACEMENT OF WOUND VAC;  Surgeon: Greer Pickerel, MD;  Location: [facility];  Service: General;  Laterality: N/A;  . BRONCHIAL BRUSHINGS  04/11/2019   Procedure: BRONCHIAL BRUSHINGS;  Surgeon: Collene Gobble, MD;  Location: [facility] ENDOSCOPY;  Service: Pulmonary;;  . BRONCHIAL WASHINGS  04/11/2019   Procedure: BRONCHIAL WASHINGS;  Surgeon: Collene Gobble, MD;  Location: [facility] ENDOSCOPY;  Service: Pulmonary;;  . COLONOSCOPY  03/13/2010   FBP:ZWCHENID hemorrhoids likely cause of hematochezia, otherwise normal  . COLONOSCOPY  05/24/2019  . COLONOSCOPY WITH ESOPHAGOGASTRODUODENOSCOPY (EGD)  04/2019  . Colostomy reversal  11/2006  . DEBRIDEMENT OF ABDOMINAL WALL ABSCESS N/A 06/29/2019   Procedure: EXCISIONAL DEBRIDEMENT OF ABDOMINAL WALL/SUBCU SEROMA;  Surgeon: Greer Pickerel, MD;  Location: [facility];  Service: General;  Laterality: N/A;  . ESOPHAGOGASTRODUODENOSCOPY  03/13/2010   POE:[id number] esophagus, status post passage of a Maloney dilator/Small hiatal hernia/ Antral erosions, status post biopsy  . EXAMINATION UNDER ANESTHESIA N/A 05/11/2012   Procedure: EXAM UNDER ANESTHESIA;  Surgeon: Donato Heinz, MD;  Location: [facility];  Service: General;  Laterality: N/A;  . Exploratory laparotomy with resection  2008   colonoscopy  . FINE NEEDLE ASPIRATION  04/11/2019   Procedure: FINE NEEDLE ASPIRATION (FNA) LINEAR;  Surgeon: Collene Gobble, MD;  Location: [facility] ENDOSCOPY;  Service: Pulmonary;;  . FINGER CLOSED REDUCTION Right 08/05/2012   Procedure: CLOSED REDUCTION RIGHT THUMB (FINGER);  Surgeon: Linna Hoff, MD;  Location: [facility];  Service: Orthopedics;  Laterality: Right;  . HERNIA REPAIR     incisional hernia surgery   . Hx of abd wall seroma  08/2007   Drained via [place] in  [place], [place]  . Hx of abd wall seroma  10/2007   Drained by Dr. Geroge Baseman in office  . INCISION AND DRAINAGE ABSCESS N/A 06/29/2019   Procedure: INCISION AND DRAINAGE;  Surgeon: Greer Pickerel, MD;  Location: [facility];  Service: General;  Laterality: N/A;  . IR RADIOLOGIST EVAL & MGMT  03/15/2019  . IR RADIOLOGIST EVAL & MGMT  04/06/2019  . Kidney stones    . LAPAROSCOPIC GASTRIC SLEEVE RESECTION N/A 02/13/2014   Procedure: LAPAROSCOPIC GASTRIC SLEEVE RESECTION LYSIS OF ADHESIONS, UPPER ENDOSCOPY;  Surgeon: [name], MD;  Location: [facility];  Service: General;  Laterality: N/A;  . LAPAROSCOPY N/A 09/28/2014   Procedure: LAPAROSCOPY DIAGNOSTIC, INCISION AND DRAINAGE WITH LAPAROSCOPIC EXPLORATION OF ABDOMINAL WALL SEROMA with ultrasound;  Surgeon: Greer Pickerel, MD;  Location: [facility];  Service: General;  Laterality: N/A;  . LUNG BIOPSY  04/11/2019   Procedure: LUNG BIOPSY;  Surgeon: Collene Gobble, MD;  Location: [facility] ENDOSCOPY;  Service: Pulmonary;;  distal trachea  . PLACEMENT OF SETON N/A 05/11/2012   Procedure: PLACEMENT OF SETON;  Surgeon: Donato Heinz, MD;  Location: [facility];  Service: General;  Laterality: N/A;  . root canal 7-16    . TONSILLECTOMY    . TREATMENT FISTULA ANAL     x 2  . UPPER GASTROINTESTINAL ENDOSCOPY  05/24/2019  . VIDEO BRONCHOSCOPY WITH ENDOBRONCHIAL ULTRASOUND N/A 04/11/2019   Procedure: VIDEO BRONCHOSCOPY WITH ENDOBRONCHIAL ULTRASOUND;  Surgeon: Collene Gobble, MD;  Location: [facility] ENDOSCOPY;  Service: Pulmonary;  Laterality: N/A;    Family History  Problem Relation Age of Onset  . Asthma Maternal Grandmother   . Colon polyps Maternal Grandmother   . Colon cancer Neg Hx   . Esophageal cancer Neg Hx   . Rectal cancer Neg Hx   . Stomach cancer Neg Hx     Social History   Social History Narrative  . Not on file     Review of Systems General: Denies fevers, chills, weight loss CV: Denies chest pain, shortness of breath, palpitations  Physical Exam Vitals with  BMI 12/13/2019 12/12/2019 11/23/2019  Height 5\' 6"  5\' 6"  -  Weight 242 lbs 242 lbs -  BMI 03.50 09.38 -  Systolic 182 993 716  Diastolic 85 80 65  Pulse 97 113 81    General:  No acute distress,  Alert and oriented, Non-Toxic, Normal speech and affect Abdomen: Abdomen is soft nontender.  She has a midline incision that is open at the inferior aspect approximately 4 cm.  There is significant tunneling superiorly of at least 10 cm.  The base of the wound shows healthy appearing granulation tissue.  Assessment/Plan Patient presents with a chronic abdominal wound that has not healed after multiple prior surgical interventions.  I explained the only thought that I would have would be to reexcise the cavity and close it.  I explained the outcome would be uncertain.  Certainly she would to have the potential to have a larger wound than she currently has.  My opinion she is not a candidate for any type of flap closure in this area.  If I were to reexcise it I might potentially use the average device for retention sutures while it heals.  I have encouraged her to think about this as I am again not terribly optimistic about the outcome given her demonstrated lack of ability to heal this over extremely long period of time.  She is going to think about it and call us if she wants to discuss it further.  All of her questions were answered.  Cindra Presume 12/13/2019, 6:41 PM

## 2019-12-13 NOTE — Telephone Encounter (Signed)
Error

## 2019-12-15 DIAGNOSIS — K91872 Postprocedural seroma of a digestive system organ or structure following a digestive system procedure: Secondary | ICD-10-CM | POA: Diagnosis not present

## 2019-12-15 DIAGNOSIS — D509 Iron deficiency anemia, unspecified: Secondary | ICD-10-CM | POA: Diagnosis not present

## 2019-12-15 DIAGNOSIS — F419 Anxiety disorder, unspecified: Secondary | ICD-10-CM | POA: Diagnosis not present

## 2019-12-15 DIAGNOSIS — F209 Schizophrenia, unspecified: Secondary | ICD-10-CM | POA: Diagnosis not present

## 2019-12-15 DIAGNOSIS — F319 Bipolar disorder, unspecified: Secondary | ICD-10-CM | POA: Diagnosis not present

## 2019-12-15 DIAGNOSIS — D869 Sarcoidosis, unspecified: Secondary | ICD-10-CM | POA: Diagnosis not present

## 2019-12-18 DIAGNOSIS — D509 Iron deficiency anemia, unspecified: Secondary | ICD-10-CM | POA: Diagnosis not present

## 2019-12-18 DIAGNOSIS — F419 Anxiety disorder, unspecified: Secondary | ICD-10-CM | POA: Diagnosis not present

## 2019-12-18 DIAGNOSIS — F319 Bipolar disorder, unspecified: Secondary | ICD-10-CM | POA: Diagnosis not present

## 2019-12-18 DIAGNOSIS — K91872 Postprocedural seroma of a digestive system organ or structure following a digestive system procedure: Secondary | ICD-10-CM | POA: Diagnosis not present

## 2019-12-18 DIAGNOSIS — D869 Sarcoidosis, unspecified: Secondary | ICD-10-CM | POA: Diagnosis not present

## 2019-12-18 DIAGNOSIS — F209 Schizophrenia, unspecified: Secondary | ICD-10-CM | POA: Diagnosis not present

## 2019-12-19 ENCOUNTER — Telehealth: Payer: Self-pay | Admitting: Emergency Medicine

## 2019-12-19 ENCOUNTER — Telehealth: Payer: Self-pay | Admitting: Family Medicine

## 2019-12-19 MED ORDER — PREDNISONE 10 MG PO TABS: ORAL_TABLET | ORAL | 0 refills | Status: DC

## 2019-12-19 MED ORDER — AZITHROMYCIN 250 MG PO TABS: 250.0000 mg | ORAL_TABLET | ORAL | 0 refills | Status: DC

## 2019-12-19 NOTE — Telephone Encounter (Signed)
Spoke with the pt and notified of recs per [facility]  She verbalized understanding  Rxs were sent

## 2019-12-19 NOTE — Telephone Encounter (Signed)
Please Advise

## 2019-12-19 NOTE — Telephone Encounter (Signed)
Still not feeling well would Dr.Pickard call in Prednisone for her.       [id number] - [place], [place]

## 2019-12-19 NOTE — Telephone Encounter (Signed)
Spoke with the pt  She states having increased cough and congestion over the past 6 wks  She has been coughing up minimal clear to yellow sputum  She has had a low grade fever off and on over the past wk- 99- 101  She states occ wheezing  She was covid tested 12/12/19 and this was neg and she has also been vaccinated against covid  She was prescribed augmentin per PCP 12/12/19 and never completed bc this caused diarrhea  She is requesting recs and possible pred taper  Please advise, thanks!

## 2019-12-19 NOTE — Telephone Encounter (Signed)
Can you please send in [place] and Prednisone 40mg  x 3 days; 30mg  x 3 days; 20mg  x 3 days; 10mg  x 3 days

## 2019-12-19 NOTE — Telephone Encounter (Signed)
No, she just got prednisone 10/19.  NTBS

## 2019-12-20 DIAGNOSIS — F319 Bipolar disorder, unspecified: Secondary | ICD-10-CM | POA: Diagnosis not present

## 2019-12-20 DIAGNOSIS — D509 Iron deficiency anemia, unspecified: Secondary | ICD-10-CM | POA: Diagnosis not present

## 2019-12-20 DIAGNOSIS — K91872 Postprocedural seroma of a digestive system organ or structure following a digestive system procedure: Secondary | ICD-10-CM | POA: Diagnosis not present

## 2019-12-20 DIAGNOSIS — F419 Anxiety disorder, unspecified: Secondary | ICD-10-CM | POA: Diagnosis not present

## 2019-12-20 DIAGNOSIS — F209 Schizophrenia, unspecified: Secondary | ICD-10-CM | POA: Diagnosis not present

## 2019-12-20 DIAGNOSIS — D869 Sarcoidosis, unspecified: Secondary | ICD-10-CM | POA: Diagnosis not present

## 2019-12-21 NOTE — Telephone Encounter (Signed)
Pt is almost through half of course, and still continuing Prednisone.

## 2019-12-22 ENCOUNTER — Other Ambulatory Visit: Payer: Self-pay

## 2019-12-22 ENCOUNTER — Encounter (HOSPITAL_BASED_OUTPATIENT_CLINIC_OR_DEPARTMENT_OTHER): Payer: Medicare Other | Admitting: Internal Medicine

## 2019-12-22 DIAGNOSIS — K91873 Postprocedural seroma of a digestive system organ or structure following other procedure: Secondary | ICD-10-CM | POA: Diagnosis not present

## 2019-12-22 DIAGNOSIS — Z933 Colostomy status: Secondary | ICD-10-CM | POA: Diagnosis not present

## 2019-12-22 DIAGNOSIS — F319 Bipolar disorder, unspecified: Secondary | ICD-10-CM | POA: Diagnosis not present

## 2019-12-22 DIAGNOSIS — D869 Sarcoidosis, unspecified: Secondary | ICD-10-CM | POA: Diagnosis not present

## 2019-12-22 DIAGNOSIS — K91872 Postprocedural seroma of a digestive system organ or structure following a digestive system procedure: Secondary | ICD-10-CM | POA: Diagnosis not present

## 2019-12-22 DIAGNOSIS — T8131XA Disruption of external operation (surgical) wound, not elsewhere classified, initial encounter: Secondary | ICD-10-CM | POA: Diagnosis not present

## 2019-12-22 DIAGNOSIS — S31105S Unspecified open wound of abdominal wall, periumbilic region without penetration into peritoneal cavity, sequela: Secondary | ICD-10-CM | POA: Diagnosis not present

## 2019-12-22 DIAGNOSIS — Z9884 Bariatric surgery status: Secondary | ICD-10-CM | POA: Diagnosis not present

## 2019-12-22 DIAGNOSIS — D509 Iron deficiency anemia, unspecified: Secondary | ICD-10-CM | POA: Diagnosis not present

## 2019-12-22 DIAGNOSIS — F419 Anxiety disorder, unspecified: Secondary | ICD-10-CM | POA: Diagnosis not present

## 2019-12-22 DIAGNOSIS — F209 Schizophrenia, unspecified: Secondary | ICD-10-CM | POA: Diagnosis not present

## 2019-12-25 DIAGNOSIS — D509 Iron deficiency anemia, unspecified: Secondary | ICD-10-CM | POA: Diagnosis not present

## 2019-12-25 DIAGNOSIS — F209 Schizophrenia, unspecified: Secondary | ICD-10-CM | POA: Diagnosis not present

## 2019-12-25 DIAGNOSIS — F319 Bipolar disorder, unspecified: Secondary | ICD-10-CM | POA: Diagnosis not present

## 2019-12-25 DIAGNOSIS — F419 Anxiety disorder, unspecified: Secondary | ICD-10-CM | POA: Diagnosis not present

## 2019-12-25 DIAGNOSIS — K91872 Postprocedural seroma of a digestive system organ or structure following a digestive system procedure: Secondary | ICD-10-CM | POA: Diagnosis not present

## 2019-12-25 DIAGNOSIS — D869 Sarcoidosis, unspecified: Secondary | ICD-10-CM | POA: Diagnosis not present

## 2019-12-25 NOTE — Progress Notes (Signed)
[name], BLUE ([id number]) Visit Report for 12/22/2019 Arrival Information Details Patient Name: Date of Service: [name], [place] [name] 12/22/2019 10:15 A M Medical Record Number: [id number] Patient Account Number: [id number] Date of Birth/Sex: Treating RN: Jul 11, 1975 (44 y.o. [name] Primary Care Townsend Cudworth: Jenna Luo Other Clinician: Referring Josefine Fuhr: Treating Daphyne Miguez/Extender: Rosezena Sensor in Treatment: 9 Visit Information History Since Last Visit Added or deleted any medications: No Patient Arrived: Ambulatory Any new allergies or adverse reactions: No Arrival Time: 10:28 Had a fall or experienced change in No Accompanied By: mother activities of daily living that may affect Transfer Assistance: None risk of falls: Patient Identification Verified: Yes Signs or symptoms of abuse/neglect since last visito No Secondary Verification Process Completed: Yes Hospitalized since last visit: No Patient Requires Transmission-Based Precautions: No Implantable device outside of the clinic excluding No Patient Has Alerts: No cellular tissue based products placed in the center since last visit: Has Dressing in Place as Prescribed: Yes Pain Present Now: No Electronic Signature(s) Signed: 12/25/2019 5:57:05 PM By: Levan Hurst RN, BSN Entered By: Levan Hurst on 12/22/2019 10:28:17 -------------------------------------------------------------------------------- Clinic Level of Care Assessment Details Patient Name: Date of Service: Melissa Spray, MA [name] 12/22/2019 10:15 A M Medical Record Number: [id number] Patient Account Number: [id number] Date of Birth/Sex: Treating RN: November 11, 1975 (44 y.o. [name] Primary Care Kinlie Janice: Jenna Luo Other Clinician: Referring Shandel Busic: Treating Camila Maita/Extender: Rosezena Sensor in Treatment: 9 Clinic Level of Care Assessment Items TOOL 4 Quantity Score []  - 0 Use  when only an EandM is performed on FOLLOW-UP visit ASSESSMENTS - Nursing Assessment / Reassessment X- 1 10 Reassessment of Co-morbidities (includes updates in patient status) X- 1 5 Reassessment of Adherence to Treatment Plan ASSESSMENTS - Wound and Skin A ssessment / Reassessment X - Simple Wound Assessment / Reassessment - one wound 1 5 []  - 0 Complex Wound Assessment / Reassessment - multiple wounds []  - 0 Dermatologic / Skin Assessment (not related to wound area) ASSESSMENTS - Focused Assessment []  - 0 Circumferential Edema Measurements - multi extremities []  - 0 Nutritional Assessment / Counseling / Intervention []  - 0 Lower Extremity Assessment (monofilament, tuning fork, pulses) []  - 0 Peripheral Arterial Disease Assessment (using hand held doppler) ASSESSMENTS - Ostomy and/or Continence Assessment and Care []  - 0 Incontinence Assessment and Management []  - 0 Ostomy Care Assessment and Management (repouching, etc.) PROCESS - Coordination of Care X - Simple Patient / Family Education for ongoing care 1 15 []  - 0 Complex (extensive) Patient / Family Education for ongoing care X- 1 10 Staff obtains Programmer, systems, Records, T Results / Process Orders est X- 1 10 Staff telephones HHA, Nursing Homes / Clarify orders / etc []  - 0 Routine Transfer to another Facility (non-emergent condition) []  - 0 Routine Hospital Admission (non-emergent condition) []  - 0 New Admissions / Biomedical engineer / Ordering NPWT Apligraf, etc. , []  - 0 Emergency Hospital Admission (emergent condition) X- 1 10 Simple Discharge Coordination []  - 0 Complex (extensive) Discharge Coordination PROCESS - Special Needs []  - 0 Pediatric / Minor Patient Management []  - 0 Isolation Patient Management []  - 0 Hearing / Language / Visual special needs []  - 0 Assessment of Community assistance (transportation, D/C planning, etc.) []  - 0 Additional assistance / Altered mentation []  - 0 Support  Surface(s) Assessment (bed, cushion, seat, etc.) INTERVENTIONS - Wound Cleansing / Measurement X - Simple Wound Cleansing - one wound 1 5 []  - 0  Complex Wound Cleansing - multiple wounds X- 1 5 Wound Imaging (photographs - any number of wounds) []  - 0 Wound Tracing (instead of photographs) X- 1 5 Simple Wound Measurement - one wound []  - 0 Complex Wound Measurement - multiple wounds INTERVENTIONS - Wound Dressings X - Small Wound Dressing one or multiple wounds 1 10 []  - 0 Medium Wound Dressing one or multiple wounds []  - 0 Large Wound Dressing one or multiple wounds X- 1 5 Application of Medications - topical []  - 0 Application of Medications - injection INTERVENTIONS - Miscellaneous []  - 0 External ear exam []  - 0 Specimen Collection (cultures, biopsies, blood, body fluids, etc.) []  - 0 Specimen(s) / Culture(s) sent or taken to Lab for analysis []  - 0 Patient Transfer (multiple staff / Civil Service fast streamer / Similar devices) []  - 0 Simple Staple / Suture removal (25 or less) []  - 0 Complex Staple / Suture removal (26 or more) []  - 0 Hypo / Hyperglycemic Management (close monitor of Blood Glucose) []  - 0 Ankle / Brachial Index (ABI) - do not check if billed separately X- 1 5 Vital Signs Has the patient been seen at the hospital within the last three years: Yes Total Score: 100 Level Of Care: New/Established - Level 3 Electronic Signature(s) Signed: 12/22/2019 4:23:42 PM By: Baruch Gouty RN, BSN Entered By: Baruch Gouty on 12/22/2019 10:58:14 -------------------------------------------------------------------------------- Encounter Discharge Information Details Patient Name: Date of Service: Melissa Spray, MA [name] 12/22/2019 10:15 A M Medical Record Number: [id number] Patient Account Number: [id number] Date of Birth/Sex: Treating RN: 13-Nov-1975 (43 y.o. [name] Primary Care Quaid Yeakle: Jenna Luo Other Clinician: Referring Mohit Zirbes: Treating  Ambrielle Kington/Extender: Rosezena Sensor in Treatment: 9 Encounter Discharge Information Items Discharge Condition: Stable Ambulatory Status: Ambulatory Discharge Destination: Home Transportation: Private Auto Accompanied By: mother Schedule Follow-up Appointment: Yes Clinical Summary of Care: Patient Declined Electronic Signature(s) Signed: 12/22/2019 4:23:42 PM By: Baruch Gouty RN, BSN Entered By: Baruch Gouty on 12/22/2019 12:14:35 -------------------------------------------------------------------------------- Multi Wound Chart Details Patient Name: Date of Service: Melissa Spray, MA [name] 12/22/2019 10:15 A M Medical Record Number: [id number] Patient Account Number: [id number] Date of Birth/Sex: Treating RN: 24-May-1975 (44 y.o. [name] Primary Care Sevan Mcbroom: Jenna Luo Other Clinician: Referring Chick Cousins: Treating Konnor Vondrasek/Extender: Rosezena Sensor in Treatment: 9 Vital Signs Height(in): 66 Pulse(bpm): 97 Weight(lbs): 235 Blood Pressure(mmHg): 110/65 Body Mass Index(BMI): 38 Temperature(F): 97.6 Respiratory Rate(breaths/min): 18 Photos: [1:No Photos Abdomen - midline] [N/A:N/A N/A] Wound Location: [1:Surgical Injury] [N/A:N/A] Wounding Event: [1:Open Surgical Wound] [N/A:N/A] Primary Etiology: [1:Anemia, Hypertension, Osteoarthritis,] [N/A:N/A] Comorbid History: [1:Confinement Anxiety 06/29/2019] [N/A:N/A] Date Acquired: [1:9] [N/A:N/A] Weeks of Treatment: [1:Open] [N/A:N/A] Wound Status: [1:3.2x1.7x3.4] [N/A:N/A] Measurements L x W x D (cm) [1:4.273] [N/A:N/A] A (cm) : rea [1:14.527] [N/A:N/A] Volume (cm) : [1:70.10%] [N/A:N/A] % Reduction in A rea: [1:82.50%] [N/A:N/A] % Reduction in Volume: [1:7] Starting Position 1 (o'clock): [1:3] Ending Position 1 (o'clock): [1:8.5] Maximum Distance 1 (cm): [1:Yes] [N/A:N/A] Undermining: [1:Full Thickness With Exposed Support N/A] Classification: [1:Structures  Medium] [N/A:N/A] Exudate Amount: [1:Serosanguineous] [N/A:N/A] Exudate Type: [1:red, brown] [N/A:N/A] Exudate Color: [1:Well defined, not attached] [N/A:N/A] Wound Margin: [1:Large (67-100%)] [N/A:N/A] Granulation Amount: [1:Pink] [N/A:N/A] Granulation Quality: [1:Small (1-33%)] [N/A:N/A] Necrotic Amount: [1:Fat Layer (Subcutaneous Tissue): Yes N/A] Exposed Structures: [1:Fascia: No Tendon: No Muscle: No Joint: No Bone: No Small (1-33%)] [N/A:N/A] Treatment Notes Electronic Signature(s) Signed: 12/22/2019 4:23:42 PM By: Baruch Gouty RN, BSN Signed: 12/25/2019 2:10:44 PM By: Linton Ham MD Entered By:  Linton Ham on 12/22/2019 11:16:32 -------------------------------------------------------------------------------- Multi-Disciplinary Care Plan Details Patient Name: Date of Service: [name], [place] [name] 12/22/2019 10:15 A M Medical Record Number: [id number] Patient Account Number: [id number] Date of Birth/Sex: Treating RN: May 19, 1975 (44 y.o. [name] Primary Care Jumana Paccione: Jenna Luo Other Clinician: Referring Versie Soave: Treating Van Seymore/Extender: Rosezena Sensor in Treatment: 9 Active Inactive Nutrition Nursing Diagnoses: Potential for alteratiion in Nutrition/Potential for imbalanced nutrition Goals: Patient/caregiver agrees to and verbalizes understanding of need to use nutritional supplements and/or vitamins as prescribed Date Initiated: 10/16/2019 Target Resolution Date: 01/12/2020 Goal Status: Active Interventions: Assess patient nutrition upon admission and as needed per policy Provide education on nutrition Treatment Activities: Education provided on Nutrition : 12/08/2019 Notes: Wound/Skin Impairment Nursing Diagnoses: Impaired tissue integrity Knowledge deficit related to ulceration/compromised skin integrity Goals: Patient/caregiver will verbalize understanding of skin care regimen Date Initiated:  10/16/2019 Target Resolution Date: 01/12/2020 Goal Status: Active Ulcer/skin breakdown will have a volume reduction of 30% by week 4 Date Initiated: 10/16/2019 Date Inactivated: 12/22/2019 Target Resolution Date: 12/15/2019 Goal Status: Unmet Unmet Reason: chronic seromas Ulcer/skin breakdown will have a volume reduction of 50% by week 8 Date Initiated: 12/22/2019 Target Resolution Date: 01/12/2020 Goal Status: Active Interventions: Assess patient/caregiver ability to obtain necessary supplies Assess patient/caregiver ability to perform ulcer/skin care regimen upon admission and as needed Assess ulceration(s) every visit Provide education on ulcer and skin care Notes: Electronic Signature(s) Signed: 12/22/2019 4:23:42 PM By: Baruch Gouty RN, BSN Entered By: Baruch Gouty on 12/22/2019 10:53:58 -------------------------------------------------------------------------------- Pain Assessment Details Patient Name: Date of Service: Melissa Spray, MA [name] 12/22/2019 10:15 A M Medical Record Number: [id number] Patient Account Number: [id number] Date of Birth/Sex: Treating RN: [birth year] (44 y.o. [name] Primary Care Twinkle Sockwell: Jenna Luo Other Clinician: Referring Zaryah Seckel: Treating Natasia Sanko/Extender: Rosezena Sensor in Treatment: 9 Active Problems Location of Pain Severity and Description of Pain Patient Has Paino No Site Locations Pain Management and Medication Current Pain Management: Electronic Signature(s) Signed: 12/25/2019 5:57:05 PM By: Levan Hurst RN, BSN Entered By: Levan Hurst on 12/22/2019 10:28:46 -------------------------------------------------------------------------------- Patient/Caregiver Education Details Patient Name: Date of Service: Melissa Spray, MA [name] 10/29/2021andnbsp10:15 A M Medical Record Number: [id number] Patient Account Number: [id number] Date of Birth/Gender: Treating RN: [birth year] (44 y.o. [name] Primary Care Physician: Jenna Luo Other Clinician: Referring Physician: Treating Physician/Extender: Rosezena Sensor in Treatment: 9 Education Assessment Education Provided To: Patient Education Topics Provided Nutrition: Methods: Explain/Verbal Responses: Reinforcements needed, State content correctly Wound/Skin Impairment: Methods: Explain/Verbal Responses: Reinforcements needed, State content correctly Electronic Signature(s) Signed: 12/22/2019 4:23:42 PM By: Baruch Gouty RN, BSN Entered By: Baruch Gouty on 12/22/2019 10:54:33 -------------------------------------------------------------------------------- Wound Assessment Details Patient Name: Date of Service: Melissa Spray, MA [name] 12/22/2019 10:15 A M Medical Record Number: [id number] Patient Account Number: [id number] Date of Birth/Sex: Treating RN: 1976/02/15 (44 y.o. [name] Primary Care Emmanuel Ercole: Jenna Luo Other Clinician: Referring Rhyse Skowron: Treating Marion Seese/Extender: Rosezena Sensor in Treatment: 9 Wound Status Wound Number: 1 Primary Etiology: Open Surgical Wound Wound Location: Abdomen - midline Wound Status: Open Wounding Event: Surgical Injury Comorbid History: Anemia, Hypertension, Osteoarthritis, Confinement Anxiety Date Acquired: 06/29/2019 Weeks Of Treatment: 9 Clustered Wound: No Wound Measurements Length: (cm) 3.2 Width: (cm) 1.7 Depth: (cm) 3.4 Area: (cm) 4.273 Volume: (cm) 14.527 Wound Description Classification: Full Thickness With Exposed Support Struct Wound Margin: Well defined, not attached Exudate Amount: Medium Exudate Type: Serosanguineous Exudate Color:  red, brown Foul Odor After Cleansing: Slough/Fibrino % Reduction in Area: 70.1% % Reduction in Volume: 82.5% Epithelialization: Small (1-33%) Tunneling: No Undermining: Yes Starting Position (o'clock): 7 Ending Position (o'clock):  3 Maximum Distance: (cm) 8.5 ures No Yes Wound Bed Granulation Amount: Large (67-100%) Exposed Structure Granulation Quality: Pink Fascia Exposed: No Necrotic Amount: Small (1-33%) Fat Layer (Subcutaneous Tissue) Exposed: Yes Necrotic Quality: Adherent Slough Tendon Exposed: No Muscle Exposed: No Joint Exposed: No Bone Exposed: No Treatment Notes Wound #1 (Abdomen - midline) 3. Primary Dressing Applied Other primary dressing (specifiy in notes) 4. Secondary Dressing ABD Pad Foam Border Dressing Notes wet to dry kerlix packing. HH to apply wound vac. Electronic Signature(s) Signed: 12/25/2019 5:57:05 PM By: Levan Hurst RN, BSN Entered By: Levan Hurst on 12/22/2019 10:36:18 -------------------------------------------------------------------------------- [place] Details Patient Name: Date of Service: Melissa Spray, MA [name] 12/22/2019 10:15 A M Medical Record Number: [id number] Patient Account Number: [id number] Date of Birth/Sex: Treating RN: 08/14/1975 (44 y.o. [name] Primary Care Sunita Demond: Jenna Luo Other Clinician: Referring Asa Baudoin: Treating Hayden Mabin/Extender: Rosezena Sensor in Treatment: 9 Vital Signs Time Taken: 10:28 Temperature (F): 97.6 Height (in): 66 Pulse (bpm): 97 Weight (lbs): 235 Respiratory Rate (breaths/min): 18 Body Mass Index (BMI): 37.9 Blood Pressure (mmHg): 110/65 Reference Range: 80 - 120 mg / dl Electronic Signature(s) Signed: 12/25/2019 5:57:05 PM By: Levan Hurst RN, BSN Entered By: Levan Hurst on 12/22/2019 10:28:40

## 2019-12-25 NOTE — Progress Notes (Signed)
Melissa, [name] ([id number]) Visit Report for 12/22/2019 HPI Details Patient Name: Date of Service: [name], Melissa Melissa J. 12/22/2019 10:15 A M Medical Record Number: [id number] Patient Account Number: [id number] Date of Birth/Sex: Treating RN: 22-Jan-1976 (44 y.o. [name] Primary Care Provider: Jenna [name] Other Clinician: Referring Provider: Treating Provider/Extender: [name] in Treatment: 9 History of Present Illness HPI Description: ADMISSION 10/16/2019 This is a 44 year old woman who apparently had a perforated diverticulum in the mid 2000's. She required a colostomy and then reversal. She did well up until 2009 she required an incisional hernia repair at an outside hospital. She had a gastric sleeve procedure as well in 2015. Sometime after this she developed an abdominal seroma. She had abdominal drains as initial management. She had drainage of the seroma cavity in 2016 in the OR. This resulted in resolution of the seroma cavity for a long period of time. She represented in December of last year year with a large amount of fluid. Her last CT scan was in April noted that she had a small residual fluid collection. However because of continued drainage it was elected that she would go ahead with a surgical procedure for debridement of the seroma cavity. She was taken to the OR on Jun 29, 2019. The measurements of the area were 15 x 15 x 3 at that point. A wound VAC was placed postoperatively. She tells me she loses a lot of blood in the wound VAC and she is iron deficient. Last hemoglobin I see was 11.9 on 06/27/2019. Her albumin was normal. The patient comes in the clinic here very frustrated with the lack of progress. She says the wound is not healed at all with the wound VAC. She continues to have what she describes as a large amount of sanguinous drainage and she changes the canister every day. She is not having any pain or fever. She  is wondering what her options are given the fact that she believes the wound VAC to be ineffective. Past medical history includes bipolar disorder, sarcoidosis, TIA, perforated diverticulum in the mid 2000's, incisional hernia repair in 2009, gastric sleeve procedure in 2015, hypertension 10/23/2019; there has been some improvement in her dimensions. She still changes the canister every 30 hours. I have not cultured this but there is an odor. Current dimensions are 5.5 x 2.5 x 4.2 with 6.5 cm of maximum undermining superiorly. 9/17; 2 week follow-up. The patient is using a wound VAC. She is able to show me her sanguinous drainage in the canister. She also reports that when the wound VAC is off for any period of time she develops fever up to 100. Sodium note she was referred to hematology for chronic anemia. It was felt that this is iron deficiency probably secondary to poor iron absorption. Her hemoglobin was measured at 10 with low MCH and MCHC at 25.2 with 29.9 respectively. Platelets slightly up at 459 iron was 16, TIBC at 254. Saturation 6 ferritin at 63 B12 287 10/1; 2-week follow-up. For the first time we have improvement in her dimensions the depth is improved and some of the maximal undermining. Surface of the wound looks better. When she tells me she often turns her wound VAC off at night because she does not like the alarm. She has an appointment with Dr. Claudia [name] sometime in mid October. This referral was put in by [name] her general surgeon 10/15; patient's appointment with Dr. Claudia [name] is next week. I have  been looking at this wound for a little less than 2 months now. I had initially convinced her to retry the wound VAC which she was reluctant to do because of perceived lack of improvement although at the time I was not sure she had use this consistently enough. She says that she gets a bloody fluid making her change to her canister every 30 hours although I have never seen any blood in  the surface as this wound. The dimensions of come in slightly but certainly not dramatically. The undermining area has made no consistent improvements perhaps 2 cm improvement in wound depth 10/29; the patient saw Dr. Claudia [name] on 10/20. He was wondering about the only option here being to reexcise the cavity and do primary closure. The patient was scared by his comment that she might end up having a larger wound than she currently has. He did not think she was a candidate for any type of flap closure.. Dr. Claudia [name] however was not totally optimistic about the outcome. Somewhat surprisingly her mother has arranged an appointment with Dr. Vernona [name] next Wednesday for another opinion. We have been using the wound VAC now for 2 months without a lot of improvement although perhaps some improvement in the depth of the wound certainly no improvement in the undermining. She states that the drainage however is a lot less Electronic Signature(s) Signed: 12/25/2019 2:10:44 PM By: Melissa Ham MD Entered By: [name] on 12/22/2019 11:18:43 -------------------------------------------------------------------------------- Physical Exam Details Patient Name: Date of Service: Melissa Spray, MA Melissa J. 12/22/2019 10:15 A M Medical Record Number: [id number] Patient Account Number: [id number] Date of Birth/Sex: Treating RN: April 18, 1975 (44 y.o. [name] Primary Care Provider: Jenna [name] Other Clinician: Referring Provider: Treating Provider/Extender: [name] in Treatment: 9 Constitutional Sitting or standing Blood Pressure is within target range for patient.. Pulse regular and within target range for patient.Marland Kitchen Respirations regular, non-labored and within target range.. Temperature is normal and within the target range for the patient.Marland Kitchen Appears in no distress. Gastrointestinal (GI) No tenderness around the wound. Integumentary (Hair, Skin) No erythema or evidence of  infection around the wound. Notes Wound exam; deep surgical wound. The depth of this and some of the surface are improved but the undermining is exactly the same which is substantial. What I can see of the undermining area shows a lifeless area. There is no drainage no bleeding and no tenderness. Electronic Signature(s) Signed: 12/25/2019 2:10:44 PM By: Melissa Ham MD Entered By: [name] on 12/22/2019 11:19:44 -------------------------------------------------------------------------------- Physician Orders Details Patient Name: Date of Service: Melissa Spray, MA Melissa J. 12/22/2019 10:15 A M Medical Record Number: [id number] Patient Account Number: [id number] Date of Birth/Sex: Treating RN: 05-20-1975 (44 y.o. [name] Primary Care Provider: Jenna [name] Other Clinician: Referring Provider: Treating Provider/Extender: [name] in Treatment: 9 Verbal / Phone Orders: No Diagnosis Coding ICD-10 Coding Code Description T81.31XA Disruption of external operation (surgical) wound, not elsewhere classified, initial encounter K91.873 Postprocedural seroma of a digestive system organ or structure following other procedure S31.105S Unspecified open wound of abdominal wall, periumbilic region without penetration into peritoneal cavity, sequela Follow-up Appointments Return Appointment in 2 weeks. Dressing Change Frequency Wound #1 Abdomen - midline Change dressing three times week. - by home health Skin Barriers/Peri-Wound Care Wound #1 Abdomen - midline Skin Prep Wound Cleansing Wound #1 Abdomen - midline Clean wound with Wound Cleanser - or normal saline Primary Wound Dressing Wound #1 Abdomen - midline Other: -  wet-to-dry today in clinic, home health to apply wound vac Secondary Dressing Wound #1 Abdomen - midline Foam Border - or ABD in clinic Negative Presssure Wound Therapy Wound #1 Abdomen - midline Wound Vac to wound continuously  at 160mm/hg pressure Black and White Foam combination - white foam in undermining/tunneling areas [facility] for [facility] care. - [facility] Electronic Signature(s) Signed: 12/22/2019 4:23:42 PM By: Baruch Gouty RN, BSN Signed: 12/25/2019 2:10:44 PM By: Melissa Ham MD Entered By: Baruch Gouty on 12/22/2019 10:59:02 -------------------------------------------------------------------------------- Problem List Details Patient Name: Date of Service: Melissa Spray, MA Melissa J. 12/22/2019 10:15 A M Medical Record Number: [id number] Patient Account Number: [id number] Date of Birth/Sex: Treating RN: [birth year] (44 y.o. [name] Primary Care Provider: Jenna [name] Other Clinician: Referring Provider: Treating Provider/Extender: [name] in Treatment: 9 Active Problems ICD-10 Encounter Code Description Active Date MDM Diagnosis T81.31XA Disruption of external operation (surgical) wound, not elsewhere classified, 10/16/2019 No Yes initial encounter K91.873 Postprocedural seroma of a digestive system organ or structure following 10/16/2019 No Yes other procedure S31.105S Unspecified open wound of abdominal wall, periumbilic region without 5/91/6384 No Yes penetration into peritoneal cavity, sequela Inactive Problems Resolved Problems Electronic Signature(s) Signed: 12/25/2019 2:10:44 PM By: Melissa Ham MD Entered By: [name] on 12/22/2019 11:16:25 -------------------------------------------------------------------------------- Progress Note Details Patient Name: Date of Service: Melissa Spray, MA Melissa J. 12/22/2019 10:15 A M Medical Record Number: [id number] Patient Account Number: [id number] Date of Birth/Sex: Treating RN: [birth year] (44 y.o. [name] Primary Care Provider: Jenna [name] Other Clinician: Referring Provider: Treating Provider/Extender: [name] in Treatment: 9 Subjective History of Present Illness (HPI) ADMISSION 10/16/2019 This is a 44 year old woman who apparently had a perforated diverticulum in the mid 2000's. She required a colostomy and then reversal. She did well up until 2009 she required an incisional hernia repair at an outside hospital. She had a gastric sleeve procedure as well in 2015. Sometime after this she developed an abdominal seroma. She had abdominal drains as initial management. She had drainage of the seroma cavity in 2016 in the OR. This resulted in resolution of the seroma cavity for a long period of time. She represented in December of last year year with a large amount of fluid. Her last CT scan was in April noted that she had a small residual fluid collection. However because of continued drainage it was elected that she would go ahead with a surgical procedure for debridement of the seroma cavity. She was taken to the OR on Jun 29, 2019. The measurements of the area were 15 x 15 x 3 at that point. A wound VAC was placed postoperatively. She tells me she loses a lot of blood in the wound VAC and she is iron deficient. Last hemoglobin I see was 11.9 on 06/27/2019. Her albumin was normal. The patient comes in the clinic here very frustrated with the lack of progress. She says the wound is not healed at all with the wound VAC. She continues to have what she describes as a large amount of sanguinous drainage and she changes the canister every day. She is not having any pain or fever. She is wondering what her options are given the fact that she believes the wound VAC to be ineffective. Past medical history includes bipolar disorder, sarcoidosis, TIA, perforated diverticulum in the mid 2000's, incisional hernia repair in 2009, gastric sleeve procedure in 2015, hypertension 10/23/2019; there has been  some improvement in her dimensions. She still changes the canister every 30 hours. I have not cultured this  but there is an odor. Current dimensions are 5.5 x 2.5 x 4.2 with 6.5 cm of maximum undermining superiorly. 9/17; 2 week follow-up. The patient is using a wound VAC. She is able to show me her sanguinous drainage in the canister. She also reports that when the wound VAC is off for any period of time she develops fever up to 100. Sodium note she was referred to hematology for chronic anemia. It was felt that this is iron deficiency probably secondary to poor iron absorption. Her hemoglobin was measured at 10 with low MCH and MCHC at 25.2 with 29.9 respectively. Platelets slightly up at 459 iron was 16, TIBC at 254. Saturation 6 ferritin at 63 B12 287 10/1; 2-week follow-up. For the first time we have improvement in her dimensions the depth is improved and some of the maximal undermining. Surface of the wound looks better. When she tells me she often turns her wound VAC off at night because she does not like the alarm. She has an appointment with Dr. Claudia [name] sometime in mid October. This referral was put in by [name] her general surgeon 10/15; patient's appointment with Dr. Claudia [name] is next week. I have been looking at this wound for a little less than 2 months now. I had initially convinced her to retry the wound VAC which she was reluctant to do because of perceived lack of improvement although at the time I was not sure she had use this consistently enough. She says that she gets a bloody fluid making her change to her canister every 30 hours although I have never seen any blood in the surface as this wound. The dimensions of come in slightly but certainly not dramatically. The undermining area has made no consistent improvements perhaps 2 cm improvement in wound depth 10/29; the patient saw Dr. Claudia [name] on 10/20. He was wondering about the only option here being to reexcise the cavity and do primary closure. The patient was scared by his comment that she might end up having a larger wound than she  currently has. He did not think she was a candidate for any type of flap closure.. Dr. Claudia [name] however was not totally optimistic about the outcome. Somewhat surprisingly her mother has arranged an appointment with Dr. Vernona [name] next Wednesday for another opinion. We have been using the wound VAC now for 2 months without a lot of improvement although perhaps some improvement in the depth of the wound certainly no improvement in the undermining. She states that the drainage however is a lot less Objective Constitutional Sitting or standing Blood Pressure is within target range for patient.. Pulse regular and within target range for patient.Marland Kitchen Respirations regular, non-labored and within target range.. Temperature is normal and within the target range for the patient.Marland Kitchen Appears in no distress. Vitals Time Taken: 10:28 AM, Height: 66 in, Weight: 235 lbs, BMI: 37.9, Temperature: 97.6 F, Pulse: 97 bpm, Respiratory Rate: 18 breaths/min, Blood Pressure: 110/65 mmHg. Gastrointestinal (GI) No tenderness around the wound. General Notes: Wound exam; deep surgical wound. The depth of this and some of the surface are improved but the undermining is exactly the same which is substantial. What I can see of the undermining area shows a lifeless area. There is no drainage no bleeding and no tenderness. Integumentary (Hair, Skin) No erythema or evidence of infection around the wound. Wound #1 status is Open. Original cause  of wound was Surgical Injury. The wound is located on the Abdomen - midline. The wound measures 3.2cm length x 1.7cm width x 3.4cm depth; 4.273cm^2 area and 14.527cm^3 volume. There is Fat Layer (Subcutaneous Tissue) exposed. There is no tunneling noted, however, there is undermining starting at 7:00 and ending at 3:00 with a maximum distance of 8.5cm. There is a medium amount of serosanguineous drainage noted. The wound margin is well defined and not attached to the wound base. There is large  (67-100%) pink granulation within the wound bed. There is a small (1- 33%) amount of necrotic tissue within the wound bed including Adherent Slough. Assessment Active Problems ICD-10 Disruption of external operation (surgical) wound, not elsewhere classified, initial encounter Postprocedural seroma of a digestive system organ or structure following other procedure Unspecified open wound of abdominal wall, periumbilic region without penetration into peritoneal cavity, sequela Plan Follow-up Appointments: Return Appointment in 2 weeks. Dressing Change Frequency: Wound #1 Abdomen - midline: Change dressing three times week. - by home health Skin Barriers/Peri-Wound Care: Wound #1 Abdomen - midline: Skin Prep Wound Cleansing: Wound #1 Abdomen - midline: Clean wound with Wound Cleanser - or normal saline Primary Wound Dressing: Wound #1 Abdomen - midline: Other: - wet-to-dry today in clinic, home health to apply wound vac Secondary Dressing: Wound #1 Abdomen - midline: Foam Border - or ABD in clinic Negative Presssure Wound Therapy: Wound #1 Abdomen - midline: Wound Vac to wound continuously at 173mm/hg pressure Black and White Foam combination - white foam in undermining/tunneling areas Home Health: [facility] for wound care. - [facility] 1. The patient is to see Dr. Vernona [name] next week. 2. I have continued the wound VAC although truthfully a 13-month trial has not really resulted in any major improvement here. 3. If Dr. Vernona [name] does not have a surgical option that the patient is willing to accept this may end up being O wet-to-dry perhaps with Vashe solution or Dakin's solution Electronic Signature(s) Signed: 12/25/2019 2:10:44 PM By: Melissa Ham MD Entered By: [name] on 12/22/2019 11:21:09 -------------------------------------------------------------------------------- SuperBill Details Patient Name: Date of Service: Melissa Spray, MA Melissa J.  12/22/2019 Medical Record Number: [id number] Patient Account Number: [id number] Date of Birth/Sex: Treating RN: [birth year] (44 y.o. [name] Primary Care Provider: Jenna [name] Other Clinician: Referring Provider: Treating Provider/Extender: [name] in Treatment: 9 Diagnosis Coding ICD-10 Codes Code Description T81.31XA Disruption of external operation (surgical) wound, not elsewhere classified, initial encounter K91.873 Postprocedural seroma of a digestive system organ or structure following other procedure S31.105S Unspecified open wound of abdominal wall, periumbilic region without penetration into peritoneal cavity, sequela Facility Procedures CPT4 Code: [id number] Description: 99213 - WOUND CARE VISIT-LEV 3 EST PT Modifier: Quantity: 1 Physician Procedures : CPT4 Code Description Modifier [id number] - WC PHYS LEVEL 3 - EST PT ICD-10 Diagnosis Description T81.31XA Disruption of external operation (surgical) wound, not elsewhere classified, initial encounter K91.873 Postprocedural seroma of a digestive  system organ or structure following other procedure S31.105S Unspecified open wound of abdominal wall, periumbilic region without penetration into peritoneal cavi Quantity: 1 ty, sequela Electronic Signature(s) Signed: 12/25/2019 2:10:44 PM By: Melissa Ham MD Entered By: [name] on 12/22/2019 11:21:26

## 2019-12-27 DIAGNOSIS — D509 Iron deficiency anemia, unspecified: Secondary | ICD-10-CM | POA: Diagnosis not present

## 2019-12-27 DIAGNOSIS — D869 Sarcoidosis, unspecified: Secondary | ICD-10-CM | POA: Diagnosis not present

## 2019-12-27 DIAGNOSIS — E46 Unspecified protein-calorie malnutrition: Secondary | ICD-10-CM | POA: Diagnosis not present

## 2019-12-27 DIAGNOSIS — D539 Nutritional anemia, unspecified: Secondary | ICD-10-CM | POA: Diagnosis not present

## 2019-12-27 DIAGNOSIS — K91872 Postprocedural seroma of a digestive system organ or structure following a digestive system procedure: Secondary | ICD-10-CM | POA: Diagnosis not present

## 2019-12-27 DIAGNOSIS — F209 Schizophrenia, unspecified: Secondary | ICD-10-CM | POA: Diagnosis not present

## 2019-12-27 DIAGNOSIS — F419 Anxiety disorder, unspecified: Secondary | ICD-10-CM | POA: Diagnosis not present

## 2019-12-27 DIAGNOSIS — S31109A Unspecified open wound of abdominal wall, unspecified quadrant without penetration into peritoneal cavity, initial encounter: Secondary | ICD-10-CM | POA: Diagnosis not present

## 2019-12-27 DIAGNOSIS — S301XXA Contusion of abdominal wall, initial encounter: Secondary | ICD-10-CM | POA: Diagnosis not present

## 2019-12-27 DIAGNOSIS — T8189XA Other complications of procedures, not elsewhere classified, initial encounter: Secondary | ICD-10-CM | POA: Diagnosis not present

## 2019-12-27 DIAGNOSIS — F319 Bipolar disorder, unspecified: Secondary | ICD-10-CM | POA: Diagnosis not present

## 2019-12-28 DIAGNOSIS — G4730 Sleep apnea, unspecified: Secondary | ICD-10-CM | POA: Diagnosis not present

## 2019-12-28 DIAGNOSIS — K91872 Postprocedural seroma of a digestive system organ or structure following a digestive system procedure: Secondary | ICD-10-CM | POA: Diagnosis not present

## 2019-12-28 DIAGNOSIS — Z87442 Personal history of urinary calculi: Secondary | ICD-10-CM | POA: Diagnosis not present

## 2019-12-28 DIAGNOSIS — D869 Sarcoidosis, unspecified: Secondary | ICD-10-CM | POA: Diagnosis not present

## 2019-12-28 DIAGNOSIS — F429 Obsessive-compulsive disorder, unspecified: Secondary | ICD-10-CM | POA: Diagnosis not present

## 2019-12-28 DIAGNOSIS — F419 Anxiety disorder, unspecified: Secondary | ICD-10-CM | POA: Diagnosis not present

## 2019-12-28 DIAGNOSIS — F209 Schizophrenia, unspecified: Secondary | ICD-10-CM | POA: Diagnosis not present

## 2019-12-28 DIAGNOSIS — F17200 Nicotine dependence, unspecified, uncomplicated: Secondary | ICD-10-CM | POA: Diagnosis not present

## 2019-12-28 DIAGNOSIS — Z7902 Long term (current) use of antithrombotics/antiplatelets: Secondary | ICD-10-CM | POA: Diagnosis not present

## 2019-12-28 DIAGNOSIS — Z791 Long term (current) use of non-steroidal anti-inflammatories (NSAID): Secondary | ICD-10-CM | POA: Diagnosis not present

## 2019-12-28 DIAGNOSIS — F988 Other specified behavioral and emotional disorders with onset usually occurring in childhood and adolescence: Secondary | ICD-10-CM | POA: Diagnosis not present

## 2019-12-28 DIAGNOSIS — Z9884 Bariatric surgery status: Secondary | ICD-10-CM | POA: Diagnosis not present

## 2019-12-28 DIAGNOSIS — K219 Gastro-esophageal reflux disease without esophagitis: Secondary | ICD-10-CM | POA: Diagnosis not present

## 2019-12-28 DIAGNOSIS — Z79891 Long term (current) use of opiate analgesic: Secondary | ICD-10-CM | POA: Diagnosis not present

## 2019-12-28 DIAGNOSIS — F319 Bipolar disorder, unspecified: Secondary | ICD-10-CM | POA: Diagnosis not present

## 2019-12-28 DIAGNOSIS — Z79899 Other long term (current) drug therapy: Secondary | ICD-10-CM | POA: Diagnosis not present

## 2019-12-28 DIAGNOSIS — R918 Other nonspecific abnormal finding of lung field: Secondary | ICD-10-CM | POA: Diagnosis not present

## 2019-12-28 DIAGNOSIS — Z8673 Personal history of transient ischemic attack (TIA), and cerebral infarction without residual deficits: Secondary | ICD-10-CM | POA: Diagnosis not present

## 2019-12-28 DIAGNOSIS — D509 Iron deficiency anemia, unspecified: Secondary | ICD-10-CM | POA: Diagnosis not present

## 2019-12-31 ENCOUNTER — Other Ambulatory Visit: Payer: Self-pay

## 2019-12-31 ENCOUNTER — Ambulatory Visit
Admission: RE | Admit: 2019-12-31 | Discharge: 2019-12-31 | Disposition: A | Discharge status: Home / Self Care | Payer: Medicare Other | Source: Ambulatory Visit | Attending: Emergency Medicine | Admitting: Emergency Medicine

## 2019-12-31 VITALS — BP 125/82 | HR 89 | Temp 98.1°F | Resp 19 | Wt 239.5 lb

## 2019-12-31 DIAGNOSIS — J208 Acute bronchitis due to other specified organisms: Secondary | ICD-10-CM | POA: Diagnosis not present

## 2019-12-31 DIAGNOSIS — R0602 Shortness of breath: Secondary | ICD-10-CM

## 2019-12-31 MED ORDER — DEXAMETHASONE SODIUM PHOSPHATE 10 MG/ML IJ SOLN
10.0000 mg | Freq: Once | INTRAMUSCULAR | Status: AC
  Administered 2019-12-31: 10 mg via INTRAMUSCULAR

## 2019-12-31 MED ORDER — PREDNISONE 10 MG PO TABS: 20.0000 mg | ORAL_TABLET | Freq: Every day | ORAL | 0 refills | Status: DC

## 2019-12-31 MED ORDER — AZITHROMYCIN 250 MG PO TABS: 250.0000 mg | ORAL_TABLET | Freq: Every day | ORAL | 0 refills | Status: DC

## 2019-12-31 NOTE — ED Provider Notes (Signed)
[place]   [id number] 12/31/19 Arrival Time: 0932   CC: URI  SUBJECTIVE: History from: patient.  [name] is a 44 y.o. female who presents to the urgent care with a complaint of wheezing, cough with green mucus and shortness of breath for the past 6 weeks.  States she has lung infection approximately 6 months ago and was advised to stop  vaping.  Denies sick exposure to COVID, flu or strep.  Denies recent travel.  Has tried OTC Mucinex without relief.  Symptoms are made worse with a pain.  Report  previous symptoms in the past.   Denies fever, chills, fatigue, sinus pain, rhinorrhea, sore throat, wheezing, chest pain, nausea, changes in bowel or bladder habits.     ROS: As per HPI.  All other pertinent ROS negative.     Past Medical History:  Diagnosis Date  . ADHD   . Anal fistula   . Anxiety   . Arthritis    pt denies 5/3 visit   . Bipolar affective ([place])   . Blood transfusion without reported diagnosis   . Colostomy in place [facility])   . CVA (cerebrovascular accident) ([place])    left cerebellar infarct found accidentally on CT (2020)   . Depression   . Diverticulitis 2008   perforated/ requiring resection  . Fatty liver   . Gallstones   . GERD (gastroesophageal reflux disease)    occ.  Marland Kitchen Headache    after a fall  . Herpes simplex   . History of colon polyps   . History of hiatal hernia    small  . History of kidney stones    cystoscopy  basket removal  . Hypertension   . Iron deficiency anemia   . Obese   . OCD (obsessive compulsive disorder)   . Panic attacks   . Pulmonary nodules   . Sarcoidosis   . Schizophrenia ([place])   . Shortness of breath dyspnea    pt denies on 5/3 visit   . Sleep apnea    mild, does not use c-pap machine  . Substance abuse ([place])    12 years ago-crack cocaine   Past Surgical History:  Procedure Laterality Date  . Abdominal wall hernia  07/2007   open repair with lysis of adhesions  . APPLICATION OF WOUND VAC N/A  06/29/2019   Procedure: PLACEMENT OF WOUND VAC;  Surgeon: Greer Pickerel, MD;  Location: [facility];  Service: General;  Laterality: N/A;  . BRONCHIAL BRUSHINGS  04/11/2019   Procedure: BRONCHIAL BRUSHINGS;  Surgeon: Collene Gobble, MD;  Location: [facility] ENDOSCOPY;  Service: Pulmonary;;  . BRONCHIAL WASHINGS  04/11/2019   Procedure: BRONCHIAL WASHINGS;  Surgeon: Collene Gobble, MD;  Location: [facility] ENDOSCOPY;  Service: Pulmonary;;  . COLONOSCOPY  03/13/2010   [id number] hemorrhoids likely cause of hematochezia, otherwise normal  . COLONOSCOPY  05/24/2019  . COLONOSCOPY WITH ESOPHAGOGASTRODUODENOSCOPY (EGD)  04/2019  . Colostomy reversal  11/2006  . DEBRIDEMENT OF ABDOMINAL WALL ABSCESS N/A 06/29/2019   Procedure: EXCISIONAL DEBRIDEMENT OF ABDOMINAL WALL/SUBCU SEROMA;  Surgeon: Greer Pickerel, MD;  Location: [facility];  Service: General;  Laterality: N/A;  . ESOPHAGOGASTRODUODENOSCOPY  03/13/2010   [id number] esophagus, status post passage of a Maloney dilator/Small hiatal hernia/ Antral erosions, status post biopsy  . EXAMINATION UNDER ANESTHESIA N/A 05/11/2012   Procedure: EXAM UNDER ANESTHESIA;  Surgeon: Donato Heinz, MD;  Location: [facility];  Service: General;  Laterality: N/A;  . Exploratory laparotomy with resection  2008  colonoscopy  . FINE NEEDLE ASPIRATION  04/11/2019   Procedure: FINE NEEDLE ASPIRATION (FNA) LINEAR;  Surgeon: Collene Gobble, MD;  Location: [facility] ENDOSCOPY;  Service: Pulmonary;;  . FINGER CLOSED REDUCTION Right 08/05/2012   Procedure: CLOSED REDUCTION RIGHT THUMB (FINGER);  Surgeon: Linna Hoff, MD;  Location: [place];  Service: Orthopedics;  Laterality: Right;  . HERNIA REPAIR     incisional hernia surgery   . Hx of abd wall seroma  08/2007   Drained via [place] in [place], [place]  . Hx of abd wall seroma  10/2007   Drained by Dr. Geroge Baseman in office  . INCISION AND DRAINAGE ABSCESS N/A 06/29/2019   Procedure: INCISION AND DRAINAGE;  Surgeon: Greer Pickerel, MD;  Location: [facility];   Service: General;  Laterality: N/A;  . IR RADIOLOGIST EVAL & MGMT  03/15/2019  . IR RADIOLOGIST EVAL & MGMT  04/06/2019  . Kidney stones    . LAPAROSCOPIC GASTRIC SLEEVE RESECTION N/A 02/13/2014   Procedure: LAPAROSCOPIC GASTRIC SLEEVE RESECTION LYSIS OF ADHESIONS, UPPER ENDOSCOPY;  Surgeon: [name], MD;  Location: [facility];  Service: General;  Laterality: N/A;  . LAPAROSCOPY N/A 09/28/2014   Procedure: LAPAROSCOPY DIAGNOSTIC, INCISION AND DRAINAGE WITH LAPAROSCOPIC EXPLORATION OF ABDOMINAL WALL SEROMA with ultrasound;  Surgeon: Greer Pickerel, MD;  Location: [facility];  Service: General;  Laterality: N/A;  . LUNG BIOPSY  04/11/2019   Procedure: LUNG BIOPSY;  Surgeon: Collene Gobble, MD;  Location: [facility] ENDOSCOPY;  Service: Pulmonary;;  distal trachea  . PLACEMENT OF SETON N/A 05/11/2012   Procedure: PLACEMENT OF SETON;  Surgeon: Donato Heinz, MD;  Location: [facility];  Service: General;  Laterality: N/A;  . root canal 7-16    . TONSILLECTOMY    . TREATMENT FISTULA ANAL     x 2  . UPPER GASTROINTESTINAL ENDOSCOPY  05/24/2019  . VIDEO BRONCHOSCOPY WITH ENDOBRONCHIAL ULTRASOUND N/A 04/11/2019   Procedure: VIDEO BRONCHOSCOPY WITH ENDOBRONCHIAL ULTRASOUND;  Surgeon: Collene Gobble, MD;  Location[facility] ENDOSCOPY;  Service: Pulmonary;  Laterality: N/A;   No Known Allergies Current Facility-Administered Medications on File Prior to Encounter  Medication Dose Route Frequency Provider Last Rate Last Admin  . cyanocobalamin ((VITAMIN B-12)) injection 1,000 mcg  1,000 mcg Intramuscular Q30 days Susy Frizzle, MD   1,000 mcg at 07/25/19 1041   Current Outpatient Medications on File Prior to Encounter  Medication Sig Dispense Refill  . albuterol (ACCUNEB) 0.63 MG/3ML nebulizer solution Take 1 ampule by nebulization every 6 (six) hours as needed for wheezing.    Marland Kitchen amoxicillin-clavulanate (AUGMENTIN) 875-125 MG tablet Take 1 tablet by mouth 2 (two) times daily. 20 tablet 0  . amphetamine-dextroamphetamine  (ADDERALL) 15 MG tablet Take 15 mg by mouth 3 (three) times daily.     . Ascorbic Acid (VITAMIN C PO) Take by mouth.    Marland Kitchen atenolol (TENORMIN) 25 MG tablet Take 25 mg by mouth 2 (two) times daily.     Marland Kitchen atorvastatin (LIPITOR) 40 MG tablet TAKE 1 TABLET BY MOUTH EVERY DAY 90 tablet 1  . clopidogrel (PLAVIX) 75 MG tablet Take 75 mg by mouth daily.    . diazepam (VALIUM) 2 MG tablet Take 2 mg by mouth every 6 (six) hours as needed for anxiety.    . ferrous sulfate 325 (65 FE) MG tablet Take 325 mg by mouth daily with breakfast.    . fexofenadine (ALLEGRA) 180 MG tablet Take 180 mg by mouth daily as needed for rhinitis (sore throat). (  Patient not taking: Reported on 09/08/2019)    . gabapentin (NEURONTIN) 300 MG capsule Take 300 mg by mouth 3 (three) times daily.    . Multiple Vitamins-Minerals (MULTIVITAMIN WITH MINERALS) tablet Take 1 tablet by mouth daily.     Marland Kitchen NUVIGIL 250 MG tablet Take 250 mg by mouth daily.  5  . ondansetron (ZOFRAN ODT) 4 MG disintegrating tablet Take 1 tablet (4 mg total) by mouth every 8 (eight) hours as needed for nausea or vomiting. 20 tablet 0  . pantoprazole (PROTONIX) 40 MG tablet Take 1 tablet (40 mg total) by mouth daily. 30 tablet 11  . QUEtiapine (SEROQUEL) 300 MG tablet Take 600 mg by mouth at bedtime.     . topiramate (TOPAMAX) 100 MG tablet Take 100 mg by mouth at bedtime.     . traMADol (ULTRAM) 50 MG tablet Take 100 mg by mouth every 6 (six) hours as needed.    . valACYclovir (VALTREX) 500 MG tablet TAKE 1 TABLET (500 MG TOTAL) BY MOUTH DAILY AS NEEDED (FEVER BLISTER). 90 tablet 2   Social History   Socioeconomic History  . Marital status: Single    Spouse name: Not on file  . Number of children: 2  . Years of education: 47   . Highest education level: Not on file  Occupational History  . Occupation: Insurance claims handler: UNEMPLOYED  Tobacco Use  . Smoking status: Former Smoker    Packs/day: 0.50    Years: 14.00    Pack years: 7.00     Types: E-cigarettes    Quit date: 12/20/2013    Years since quitting: 6.0  . Smokeless tobacco: Never Used  . Tobacco comment: Vapor cigarettes.  Vaping Use  . Vaping Use: Every day  . Last attempt to quit: 02/24/2019  . Substances: Nicotine, Flavoring  . Devices: started 2015  Substance and Sexual Activity  . Alcohol use: No    Alcohol/week: 0.0 standard drinks  . Drug use: Never  . Sexual activity: Not Currently    Birth control/protection: Abstinence  Other Topics Concern  . Not on file  Social History Narrative  . Not on file   Social Determinants of Health   Financial Resource Strain:   . Difficulty of Paying Living Expenses: Not on file  Food Insecurity:   . Worried About Charity fundraiser in the Last Year: Not on file  . Ran Out of Food in the Last Year: Not on file  Transportation Needs:   . Lack of Transportation (Medical): Not on file  . Lack of Transportation (Non-Medical): Not on file  Physical Activity:   . Days of Exercise per Week: Not on file  . Minutes of Exercise per Session: Not on file  Stress:   . Feeling of Stress : Not on file  Social Connections:   . Frequency of Communication with Friends and Family: Not on file  . Frequency of Social Gatherings with Friends and Family: Not on file  . Attends Religious Services: Not on file  . Active Member of Clubs or Organizations: Not on file  . Attends Archivist Meetings: Not on file  . Marital Status: Not on file  Intimate Partner Violence:   . Fear of Current or Ex-Partner: Not on file  . Emotionally Abused: Not on file  . Physically Abused: Not on file  . Sexually Abused: Not on file   Family History  Problem Relation Age of Onset  . Asthma Maternal Grandmother   .  Colon polyps Maternal Grandmother   . Colon cancer Neg Hx   . Esophageal cancer Neg Hx   . Rectal cancer Neg Hx   . Stomach cancer Neg Hx     OBJECTIVE:  Vitals:   12/31/19 1029 12/31/19 1032  BP: 125/82   Pulse: 89    Resp: 19   Temp: 98.1 F (36.7 C)   TempSrc: Oral   SpO2: 96%   Weight:  239 lb 8 oz (108.6 kg)     General appearance: alert; appears fatigued, but nontoxic; speaking in full sentences and tolerating own secretions HEENT: NCAT; Ears: EACs clear, TMs pearly gray; Eyes: PERRL.  EOM grossly intact. Sinuses: nontender; Nose: nares patent without rhinorrhea, Throat: oropharynx clear, tonsils non erythematous or enlarged, uvula midline  Neck: supple without LAD Lungs: unlabored respirations, symmetrical air entry; cough: moderate; no respiratory distress; CTAB Heart: regular rate and rhythm.  Radial pulses 2+ symmetrical bilaterally Skin: warm and dry Psychological: alert and cooperative; normal mood and affect  LABS:  No results found for this or any previous visit (from the past 24 hour(s)).   ASSESSMENT & PLAN:  1. SOB (shortness of breath)   2. Acute bronchitis due to other specified organisms     Meds ordered this encounter  Medications  . dexamethasone (DECADRON) injection 10 mg  . predniSONE (DELTASONE) 10 MG tablet    Sig: Take 2 tablets (20 mg total) by mouth daily.    Dispense:  15 tablet    Refill:  0  . azithromycin (ZITHROMAX) 250 MG tablet    Sig: Take 1 tablet (250 mg total) by mouth daily. Take first 2 tablets together, then 1 every day until finished.    Dispense:  6 tablet    Refill:  0    Discharge instructions    Get plenty of rest and push fluids Continue to take albuterol as prescribed for shortness of breath Decadron IM was given in office Azithromycin was prescribed/take as directed Low-dose prednisone was prescribed Follow-up with PCP Use OTC medications like ibuprofen or tylenol as needed fever or pain Call or go to the ED if you have any new or worsening symptoms such as fever, worsening cough, shortness of breath, chest tightness, chest pain, turning blue, changes in mental status, etc...   Reviewed expectations re: course of current medical  issues. Questions answered. Outlined signs and symptoms indicating need for more acute intervention. Patient verbalized understanding. After Visit Summary given.         Emerson Monte, [place] 12/31/19 1059

## 2019-12-31 NOTE — ED Triage Notes (Signed)
Wheezing, coughing up mucous, shortness of breath x 6 weeks.   States she had a lung infection approx 6 months ago and was told not to smoke but she has been smoking a vape.  Pt would like a steroid and abx.

## 2019-12-31 NOTE — Discharge Instructions (Signed)
°  Get plenty of rest and push fluids Continue to take albuterol as prescribed for shortness of breath Decadron IM was given in office Azithromycin was prescribed/take as directed Low-dose prednisone was prescribed Follow-up with PCP Use OTC medications like ibuprofen or tylenol as needed fever or pain Call or go to the ED if you have any new or worsening symptoms such as fever, worsening cough, shortness of breath, chest tightness, chest pain, turning blue, changes in mental status, etc..Marland Kitchen

## 2020-01-01 ENCOUNTER — Ambulatory Visit: Payer: Medicare Other | Admitting: Family Medicine

## 2020-01-01 DIAGNOSIS — K91872 Postprocedural seroma of a digestive system organ or structure following a digestive system procedure: Secondary | ICD-10-CM | POA: Diagnosis not present

## 2020-01-01 DIAGNOSIS — F209 Schizophrenia, unspecified: Secondary | ICD-10-CM | POA: Diagnosis not present

## 2020-01-01 DIAGNOSIS — F419 Anxiety disorder, unspecified: Secondary | ICD-10-CM | POA: Diagnosis not present

## 2020-01-01 DIAGNOSIS — F319 Bipolar disorder, unspecified: Secondary | ICD-10-CM | POA: Diagnosis not present

## 2020-01-01 DIAGNOSIS — D869 Sarcoidosis, unspecified: Secondary | ICD-10-CM | POA: Diagnosis not present

## 2020-01-01 DIAGNOSIS — D509 Iron deficiency anemia, unspecified: Secondary | ICD-10-CM | POA: Diagnosis not present

## 2020-01-02 ENCOUNTER — Other Ambulatory Visit: Payer: Self-pay | Admitting: Family Medicine

## 2020-01-03 ENCOUNTER — Ambulatory Visit: Payer: Medicare Other | Admitting: Primary Care

## 2020-01-03 DIAGNOSIS — F319 Bipolar disorder, unspecified: Secondary | ICD-10-CM | POA: Diagnosis not present

## 2020-01-03 DIAGNOSIS — F419 Anxiety disorder, unspecified: Secondary | ICD-10-CM | POA: Diagnosis not present

## 2020-01-03 DIAGNOSIS — D869 Sarcoidosis, unspecified: Secondary | ICD-10-CM | POA: Diagnosis not present

## 2020-01-03 DIAGNOSIS — D509 Iron deficiency anemia, unspecified: Secondary | ICD-10-CM | POA: Diagnosis not present

## 2020-01-03 DIAGNOSIS — F209 Schizophrenia, unspecified: Secondary | ICD-10-CM | POA: Diagnosis not present

## 2020-01-03 DIAGNOSIS — K91872 Postprocedural seroma of a digestive system organ or structure following a digestive system procedure: Secondary | ICD-10-CM | POA: Diagnosis not present

## 2020-01-04 ENCOUNTER — Other Ambulatory Visit: Payer: Self-pay | Admitting: Hematology and Oncology

## 2020-01-04 ENCOUNTER — Other Ambulatory Visit: Payer: Self-pay

## 2020-01-04 ENCOUNTER — Inpatient Hospital Stay: Payer: Medicare Other

## 2020-01-04 ENCOUNTER — Encounter: Payer: Self-pay | Admitting: Hematology and Oncology

## 2020-01-04 ENCOUNTER — Inpatient Hospital Stay: Payer: Medicare Other | Attending: Hematology and Oncology | Admitting: Hematology and Oncology

## 2020-01-04 VITALS — BP 126/75 | HR 89 | Temp 97.6°F | Resp 20 | Ht 66.0 in | Wt 238.2 lb

## 2020-01-04 DIAGNOSIS — D509 Iron deficiency anemia, unspecified: Secondary | ICD-10-CM | POA: Diagnosis not present

## 2020-01-04 DIAGNOSIS — D500 Iron deficiency anemia secondary to blood loss (chronic): Secondary | ICD-10-CM

## 2020-01-04 DIAGNOSIS — D75839 Thrombocytosis, unspecified: Secondary | ICD-10-CM

## 2020-01-04 LAB — CBC WITH DIFFERENTIAL (CANCER CENTER ONLY)
Abs Immature Granulocytes: 0.02 10*3/uL (ref 0.00–0.07)
Basophils Absolute: 0 10*3/uL (ref 0.0–0.1)
Basophils Relative: 0 %
Eosinophils Absolute: 0.3 10*3/uL (ref 0.0–0.5)
Eosinophils Relative: 4 %
HCT: 40.5 % (ref 36.0–46.0)
Hemoglobin: 12.7 g/dL (ref 12.0–15.0)
Immature Granulocytes: 0 %
Lymphocytes Relative: 15 %
Lymphs Abs: 1 10*3/uL (ref 0.7–4.0)
MCH: 27.5 pg (ref 26.0–34.0)
MCHC: 31.4 g/dL (ref 30.0–36.0)
MCV: 87.9 fL (ref 80.0–100.0)
Monocytes Absolute: 0.5 10*3/uL (ref 0.1–1.0)
Monocytes Relative: 7 %
Neutro Abs: 4.9 10*3/uL (ref 1.7–7.7)
Neutrophils Relative %: 74 %
Platelet Count: 303 10*3/uL (ref 150–400)
RBC: 4.61 MIL/uL (ref 3.87–5.11)
RDW: 16.4 % — ABNORMAL HIGH (ref 11.5–15.5)
WBC Count: 6.7 10*3/uL (ref 4.0–10.5)
nRBC: 0 % (ref 0.0–0.2)

## 2020-01-04 LAB — CMP (CANCER CENTER ONLY)
ALT: 18 U/L (ref 0–44)
AST: 15 U/L (ref 15–41)
Albumin: 3.4 g/dL — ABNORMAL LOW (ref 3.5–5.0)
Alkaline Phosphatase: 112 U/L (ref 38–126)
Anion gap: 8 (ref 5–15)
BUN: 11 mg/dL (ref 6–20)
CO2: 26 mmol/L (ref 22–32)
Calcium: 9.2 mg/dL (ref 8.9–10.3)
Chloride: 106 mmol/L (ref 98–111)
Creatinine: 0.76 mg/dL (ref 0.44–1.00)
GFR, Estimated: >60 mL/min (ref >60)
Glucose, Bld: 104 mg/dL — ABNORMAL HIGH (ref 70–99)
Potassium: 3.3 mmol/L — ABNORMAL LOW (ref 3.5–5.1)
Sodium: 140 mmol/L (ref 135–145)
Total Bilirubin: 0.3 mg/dL (ref 0.3–1.2)
Total Protein: 7.1 g/dL (ref 6.5–8.1)

## 2020-01-04 LAB — IRON AND TIBC
Iron: 30 ug/dL — ABNORMAL LOW (ref 41–142)
Saturation Ratios: 12 % — ABNORMAL LOW (ref 21–57)
TIBC: 263 ug/dL (ref 236–444)
UIBC: 232 ug/dL (ref 120–384)

## 2020-01-04 LAB — FERRITIN: Ferritin: 282 ng/mL (ref 11–307)

## 2020-01-04 LAB — RETIC PANEL
Immature Retic Fract: 7.6 % (ref 2.3–15.9)
RBC.: 4.57 MIL/uL (ref 3.87–5.11)
Retic Count, Absolute: 32 10*3/uL (ref 19.0–186.0)
Retic Ct Pct: 0.7 % (ref 0.4–3.1)
Reticulocyte Hemoglobin: 35.5 pg (ref >27.9)

## 2020-01-04 NOTE — Progress Notes (Signed)
[place] [phone]   [phone]  PROGRESS NOTE  Patient Care Team: Susy Frizzle, MD as PCP - General (Family Medicine) Greer Pickerel, MD as Consulting Physician (General Surgery)  Hematological/Oncological History # Iron Deficiency Anemia 1) 02/25/2019: WBC 8.4, Hgb 8.5, MCV 78.8, Plt 524. Received 510mg  IV feraheme.  2) 06/12/2019: WBC 7.1, Hgb 12.0, MCV 88.3, Plt 271 3) 10/11/2019: WBC 4.9, Hgb 9.3, MCV 84, Plt 461 4) 11/09/2019: establish care with Dr. Lorenso [name]  5) 11/16/2019-11/23/2019: received IV feraheme 510mg  x 2 doses 6) 01/04/2020: WBC 6.7, Hgb 12.7, MCV 87.9, Plt 303.   Interval History:  [name] 44 y.o. female with medical history significant for iron deficiency anemia presents for a follow up visit. The patient's last visit was on 11/09/2019 at which time she established care. In the interim since the last visit has continued to have a wound VAC in place on her abdomen.  Ms. [name] notes that she felt quite well for approximately 3 weeks after receiving her IV iron treatment.  Unfortunately that benefit faded out and she is now back to her baseline level.  She reports that she has not been able to stick with vitamins or p.o. iron due to continued issues with nausea.  She reports that when she undergoes stress she feels like there is something standing on her chest and she feels like she wants to throw up.  She thinks that this has prevented her from taking the medications to help boost her vitamin levels.  She reports that "Zofran is not helping anymore" and that she takes it quite frequently for this nauseated sensation.    She reports that she is currently still undergoing evaluation for surgery and that the plan was for her to lose approximately 5 to 10 pounds and increase her protein levels prior to surgery being able to take place.  She currently has a wound VAC in place which he notes is partially managed by a nurse that comes by 3  times a week.  She currently denies having any issues with fevers, chills, sweats, vomiting, constipation, or other abdominal discomfort.  A full 10 point ROS is listed below.  MEDICAL HISTORY:  Past Medical History:  Diagnosis Date  . ADHD   . Anal fistula   . Anxiety   . Arthritis    pt denies 5/3 visit   . Bipolar affective ([place])   . Blood transfusion without reported diagnosis   . Colostomy in place [facility])   . CVA (cerebrovascular accident) ([place])    left cerebellar infarct found accidentally on CT (2020)   . Depression   . Diverticulitis 2008   perforated/ requiring resection  . Fatty liver   . Gallstones   . GERD (gastroesophageal reflux disease)    occ.  Marland Kitchen Headache    after a fall  . Herpes simplex   . History of colon polyps   . History of hiatal hernia    small  . History of kidney stones    cystoscopy  basket removal  . Hypertension   . Iron deficiency anemia   . Obese   . OCD (obsessive compulsive disorder)   . Panic attacks   . Pulmonary nodules   . Sarcoidosis   . Schizophrenia ([place])   . Shortness of breath dyspnea    pt denies on 5/3 visit   . Sleep apnea    mild, does not use c-pap machine  . Substance abuse ([place])  12 years ago-crack cocaine    SURGICAL HISTORY: Past Surgical History:  Procedure Laterality Date  . Abdominal wall hernia  07/2007   open repair with lysis of adhesions  . APPLICATION OF WOUND VAC N/A 06/29/2019   Procedure: PLACEMENT OF WOUND VAC;  Surgeon: Greer Pickerel, MD;  Location: [facility]S;  Service: General;  Laterality: N/A;  . BRONCHIAL BRUSHINGS  04/11/2019   Procedure: BRONCHIAL BRUSHINGS;  Surgeon: Collene Gobble, MD;  Location[facility] ENDOSCOPY;  Service: Pulmonary;;  . BRONCHIAL WASHINGS  04/11/2019   Procedure: BRONCHIAL WASHINGS;  Surgeon: Collene Gobble, MD;  Location: [facility]OSCOPY;  Service: Pulmonary;;  . COLONOSCOPY  03/13/2010   [id number] hemorrhoids likely cause of hematochezia, otherwise normal  . COLONOSCOPY   05/24/2019  . COLONOSCOPY WITH ESOPHAGOGASTRODUODENOSCOPY (EGD)  04/2019  . Colostomy reversal  11/2006  . DEBRIDEMENT OF ABDOMINAL WALL ABSCESS N/A 06/29/2019   Procedure: EXCISIONAL DEBRIDEMENT OF ABDOMINAL WALL/SUBCU SEROMA;  Surgeon: Greer Pickerel, MD;  Location: [facility];  Service: General;  Laterality: N/A;  . ESOPHAGOGASTRODUODENOSCOPY  03/13/2010   [id number] esophagus, status post passage of a Maloney dilator/Small hiatal hernia/ Antral erosions, status post biopsy  . EXAMINATION UNDER ANESTHESIA N/A 05/11/2012   Procedure: EXAM UNDER ANESTHESIA;  Surgeon: Donato Heinz, MD;  Location: [facility];  Service: General;  Laterality: N/A;  . Exploratory laparotomy with resection  2008   colonoscopy  . FINE NEEDLE ASPIRATION  04/11/2019   Procedure: FINE NEEDLE ASPIRATION (FNA) LINEAR;  Surgeon: Collene Gobble, MD;  Location: [place] ENDOSCOPY;  Service: Pulmonary;;  . FINGER CLOSED REDUCTION Right 08/05/2012   Procedure: CLOSED REDUCTION RIGHT THUMB (FINGER);  Surgeon: Linna Hoff, MD;  Location: [place];  Service: Orthopedics;  Laterality: Right;  . HERNIA REPAIR     incisional hernia surgery   . Hx of abd wall seroma  08/2007   Drained via [place] in [place], [place]  . Hx of abd wall seroma  10/2007   Drained by Dr. Geroge Baseman in office  . INCISION AND DRAINAGE ABSCESS N/A 06/29/2019   Procedure: INCISION AND DRAINAGE;  Surgeon: Greer Pickerel, MD;  Location: [facility];  Service: General;  Laterality: N/A;  . IR RADIOLOGIST EVAL & MGMT  03/15/2019  . IR RADIOLOGIST EVAL & MGMT  04/06/2019  . Kidney stones    . LAPAROSCOPIC GASTRIC SLEEVE RESECTION N/A 02/13/2014   Procedure: LAPAROSCOPIC GASTRIC SLEEVE RESECTION LYSIS OF ADHESIONS, UPPER ENDOSCOPY;  Surgeon: [name], MD;  Location: [facility];  Service: General;  Laterality: N/A;  . LAPAROSCOPY N/A 09/28/2014   Procedure: LAPAROSCOPY DIAGNOSTIC, INCISION AND DRAINAGE WITH LAPAROSCOPIC EXPLORATION OF ABDOMINAL WALL SEROMA with ultrasound;  Surgeon: Greer Pickerel, MD;  Location: [facility];  Service: General;  Laterality: N/A;  . LUNG BIOPSY  04/11/2019   Procedure: LUNG BIOPSY;  Surgeon: Collene Gobble, MD;  Location: [facility] ENDOSCOPY;  Service: Pulmonary;;  distal trachea  . PLACEMENT OF SETON N/A 05/11/2012   Procedure: PLACEMENT OF SETON;  Surgeon: Donato Heinz, MD;  Location: [facility];  Service: General;  Laterality: N/A;  . root canal 7-16    . TONSILLECTOMY    . TREATMENT FISTULA ANAL     x 2  . UPPER GASTROINTESTINAL ENDOSCOPY  05/24/2019  . VIDEO BRONCHOSCOPY WITH ENDOBRONCHIAL ULTRASOUND N/A 04/11/2019   Procedure: VIDEO BRONCHOSCOPY WITH ENDOBRONCHIAL ULTRASOUND;  Surgeon: Collene Gobble, MD;  Location: [facility] ENDOSCOPY;  Service: Pulmonary;  Laterality: N/A;    SOCIAL HISTORY: Social History  Socioeconomic History  . Marital status: Single    Spouse name: Not on file  . Number of children: 2  . Years of education: 33   . Highest education level: Not on file  Occupational History  . Occupation: Insurance claims handler: UNEMPLOYED  Tobacco Use  . Smoking status: Former Smoker    Packs/day: 0.50    Years: 14.00    Pack years: 7.00    Types: E-cigarettes    Quit date: 12/20/2013    Years since quitting: 6.0  . Smokeless tobacco: Never Used  . Tobacco comment: Vapor cigarettes.  Vaping Use  . Vaping Use: Every day  . Last attempt to quit: 02/24/2019  . Substances: Nicotine, Flavoring  . Devices: started 2015  Substance and Sexual Activity  . Alcohol use: No    Alcohol/week: 0.0 standard drinks  . Drug use: Never  . Sexual activity: Not Currently    Birth control/protection: Abstinence  Other Topics Concern  . Not on file  Social History Narrative  . Not on file   Social Determinants of Health   Financial Resource Strain:   . Difficulty of Paying Living Expenses: Not on file  Food Insecurity:   . Worried About Charity fundraiser in the Last Year: Not on file  . Ran Out of Food in the Last Year: Not on file   Transportation Needs:   . Lack of Transportation (Medical): Not on file  . Lack of Transportation (Non-Medical): Not on file  Physical Activity:   . Days of Exercise per Week: Not on file  . Minutes of Exercise per Session: Not on file  Stress:   . Feeling of Stress : Not on file  Social Connections:   . Frequency of Communication with Friends and Family: Not on file  . Frequency of Social Gatherings with Friends and Family: Not on file  . Attends Religious Services: Not on file  . Active Member of Clubs or Organizations: Not on file  . Attends Archivist Meetings: Not on file  . Marital Status: Not on file  Intimate Partner Violence:   . Fear of Current or Ex-Partner: Not on file  . Emotionally Abused: Not on file  . Physically Abused: Not on file  . Sexually Abused: Not on file    FAMILY HISTORY: Family History  Problem Relation Age of Onset  . Asthma Maternal Grandmother   . Colon polyps Maternal Grandmother   . Colon cancer Neg Hx   . Esophageal cancer Neg Hx   . Rectal cancer Neg Hx   . Stomach cancer Neg Hx     ALLERGIES:  has No Known Allergies.  MEDICATIONS:  Current Outpatient Medications  Medication Sig Dispense Refill  . albuterol (ACCUNEB) 0.63 MG/3ML nebulizer solution Take 1 ampule by nebulization every 6 (six) hours as needed for wheezing.    Marland Kitchen albuterol (VENTOLIN HFA) 108 (90 Base) MCG/ACT inhaler INHALE 2 PUFFS INTO THE LUNGS EVERY 4 (FOUR) HOURS AS NEEDED FOR WHEEZING OR SHORTNESS OF BREATH. 18 each 1  . amoxicillin-clavulanate (AUGMENTIN) 875-125 MG tablet Take 1 tablet by mouth 2 (two) times daily. 20 tablet 0  . amphetamine-dextroamphetamine (ADDERALL) 15 MG tablet Take 15 mg by mouth 3 (three) times daily.     . Ascorbic Acid (VITAMIN C PO) Take by mouth.    Marland Kitchen atenolol (TENORMIN) 25 MG tablet Take 25 mg by mouth 2 (two) times daily.     Marland Kitchen atorvastatin (LIPITOR) 40 MG tablet  TAKE 1 TABLET BY MOUTH EVERY DAY 90 tablet 1  . azithromycin  (ZITHROMAX) 250 MG tablet Take 1 tablet (250 mg total) by mouth daily. Take first 2 tablets together, then 1 every day until finished. 6 tablet 0  . clopidogrel (PLAVIX) 75 MG tablet Take 75 mg by mouth daily.    . diazepam (VALIUM) 2 MG tablet Take 2 mg by mouth every 6 (six) hours as needed for anxiety.    . ferrous sulfate 325 (65 FE) MG tablet Take 325 mg by mouth daily with breakfast.    . fexofenadine (ALLEGRA) 180 MG tablet Take 180 mg by mouth daily as needed for rhinitis (sore throat). (Patient not taking: Reported on 09/08/2019)    . gabapentin (NEURONTIN) 300 MG capsule Take 300 mg by mouth 3 (three) times daily.    . Multiple Vitamins-Minerals (MULTIVITAMIN WITH MINERALS) tablet Take 1 tablet by mouth daily.     Marland Kitchen NUVIGIL 250 MG tablet Take 250 mg by mouth daily.  5  . ondansetron (ZOFRAN ODT) 4 MG disintegrating tablet Take 1 tablet (4 mg total) by mouth every 8 (eight) hours as needed for nausea or vomiting. 20 tablet 0  . pantoprazole (PROTONIX) 40 MG tablet Take 1 tablet (40 mg total) by mouth daily. 30 tablet 11  . predniSONE (DELTASONE) 10 MG tablet Take 2 tablets (20 mg total) by mouth daily. (Patient not taking: Reported on 01/04/2020) 15 tablet 0  . QUEtiapine (SEROQUEL) 300 MG tablet Take 600 mg by mouth at bedtime.     . topiramate (TOPAMAX) 100 MG tablet Take 100 mg by mouth at bedtime.     . traMADol (ULTRAM) 50 MG tablet Take 100 mg by mouth every 6 (six) hours as needed.    . valACYclovir (VALTREX) 500 MG tablet TAKE 1 TABLET (500 MG TOTAL) BY MOUTH DAILY AS NEEDED (FEVER BLISTER). 90 tablet 2   Current Facility-Administered Medications  Medication Dose Route Frequency Provider Last Rate Last Admin  . cyanocobalamin ((VITAMIN B-12)) injection 1,000 mcg  1,000 mcg Intramuscular Q30 days Susy Frizzle, MD   1,000 mcg at 07/25/19 1041    REVIEW OF SYSTEMS:   Constitutional: ( - ) fevers, ( - )  chills , ( - ) night sweats Eyes: ( - ) blurriness of vision, ( - )  double vision, ( - ) watery eyes Ears, nose, mouth, throat, and face: ( - ) mucositis, ( - ) sore throat Respiratory: ( - ) cough, ( - ) dyspnea, ( - ) wheezes Cardiovascular: ( - ) palpitation, ( - ) chest discomfort, ( - ) lower extremity swelling Gastrointestinal:  ( - ) nausea, ( - ) heartburn, ( - ) change in bowel habits Skin: ( - ) abnormal skin rashes Lymphatics: ( - ) new lymphadenopathy, ( - ) easy bruising Neurological: ( - ) numbness, ( - ) tingling, ( - ) new weaknesses Behavioral/Psych: ( - ) mood change, ( - ) new changes  All other systems were reviewed with the patient and are negative.  PHYSICAL EXAMINATION:  Vitals:   01/04/20 1513  BP: 126/75  Pulse: 89  Resp: 20  Temp: 97.6 F (36.4 C)  SpO2: 100%   Filed Weights   01/04/20 1513  Weight: 238 lb 3.2 oz (108 kg)    GENERAL: well appearing middle aged Caucasian female alert, no distress and comfortable SKIN: skin color, texture, turgor are normal, no rashes or significant lesions EYES: conjunctiva are pink and non-injected, sclera clear  LUNGS: clear to auscultation and percussion with normal breathing effort HEART: regular rate & rhythm and no murmurs and no lower extremity edema ABDOMEN: wound vac in place in midline of abdomen with prior surgical scaring.  Musculoskeletal: no cyanosis of digits and no clubbing  PSYCH: alert & oriented x 3, fluent speech NEURO: no focal motor/sensory deficits  LABORATORY DATA:  I have reviewed the data as listed CBC Latest Ref Rng & Units 01/04/2020 11/09/2019 06/27/2019  WBC 4.0 - 10.5 K/uL 6.7 7.4 5.9  Hemoglobin 12.0 - 15.0 g/dL 12.7 10.0(L) 11.9(L)  Hematocrit 36 - 46 % 40.5 33.5(L) 37.8  Platelets 150 - 400 K/uL 303 459(H) 309    CMP Latest Ref Rng & Units 01/04/2020 11/09/2019 06/27/2019  Glucose 70 - 99 mg/dL 104(H) 82 91  BUN 6 - 20 mg/dL 11 4(L) 6  Creatinine 0.44 - 1.00 mg/dL 0.76 0.62 0.75  Sodium 135 - 145 mmol/L 140 136 138  Potassium 3.5 - 5.1 mmol/L  3.3(L) 3.5 3.4(L)  Chloride 98 - 111 mmol/L 106 100 105  CO2 22 - 32 mmol/L 26 27 26   Calcium 8.9 - 10.3 mg/dL 9.2 9.1 8.7(L)  Total Protein 6.5 - 8.1 g/dL 7.1 7.6 7.0  Total Bilirubin 0.3 - 1.2 mg/dL 0.3 <0.2(L) 0.4  Alkaline Phos 38 - 126 U/L 112 130(H) 113  AST 15 - 41 U/L 15 12(L) 16  ALT 0 - 44 U/L 18 12 20     No results found for: MPROTEIN No results found for: KPAFRELGTCHN, LAMBDASER, KAPLAMBRATIO  RADIOGRAPHIC STUDIES: No results found.  ASSESSMENT & PLAN KAIDEN DARDIS 44 y.o. female with medical history significant for iron deficiency anemia presents for a follow up visit.  On exam today Ms. Hubbard continues to struggle with the abdominal lesion currently sealed with wound VAC.  Fortunately her hemoglobin levels have risen appropriately and her iron stores appear replete after receiving IV iron therapy.  I do believe that she would benefit from continued p.o. therapy which she can take every other day due to GI discomfort.  I recommended she take this with a source of vitamin C to help improve absorption.  From hematological perspective there are no barriers for her proceeding with surgery.  We will plan to see her back in approximately 6 months time in order to assure that her iron levels remain replete.  # Iron Deficiency Anemia --iron stores have improved and Hgb has returned to normal after 2 doses of IV iron --continue to monitor for recurrent anemia --recommend PO iron sulfate 325mg  QOD with a source of vitamin C. No indication for BID dosing --no barrier to abdominal surgery from a hematological perspective.  --RTC in 6 months time to re-evaluated iron levels  No orders of the defined types were placed in this encounter.   All questions were answered. The patient knows to call the clinic with any problems, questions or concerns.  A total of more than 30 minutes were spent on this encounter and over half of that time was spent on counseling and coordination of care  as outlined above.   Ledell Peoples, MD Department of Hematology/Oncology [place] at [facility] Phone: [phone] Pager: [phone] Email: [email or web address]  01/04/2020 4:21 PM

## 2020-01-05 ENCOUNTER — Telehealth: Payer: Self-pay | Admitting: Hematology and Oncology

## 2020-01-05 ENCOUNTER — Encounter (HOSPITAL_BASED_OUTPATIENT_CLINIC_OR_DEPARTMENT_OTHER): Payer: Medicare Other | Admitting: Internal Medicine

## 2020-01-05 DIAGNOSIS — F319 Bipolar disorder, unspecified: Secondary | ICD-10-CM | POA: Diagnosis not present

## 2020-01-05 DIAGNOSIS — K91872 Postprocedural seroma of a digestive system organ or structure following a digestive system procedure: Secondary | ICD-10-CM | POA: Diagnosis not present

## 2020-01-05 DIAGNOSIS — F419 Anxiety disorder, unspecified: Secondary | ICD-10-CM | POA: Diagnosis not present

## 2020-01-05 DIAGNOSIS — F4010 Social phobia, unspecified: Secondary | ICD-10-CM | POA: Diagnosis not present

## 2020-01-05 DIAGNOSIS — F209 Schizophrenia, unspecified: Secondary | ICD-10-CM | POA: Diagnosis not present

## 2020-01-05 DIAGNOSIS — D869 Sarcoidosis, unspecified: Secondary | ICD-10-CM | POA: Diagnosis not present

## 2020-01-05 DIAGNOSIS — D509 Iron deficiency anemia, unspecified: Secondary | ICD-10-CM | POA: Diagnosis not present

## 2020-01-05 NOTE — Telephone Encounter (Signed)
Scheduled per los. Called and left msg. Mailed printout  °

## 2020-01-07 DIAGNOSIS — F319 Bipolar disorder, unspecified: Secondary | ICD-10-CM | POA: Diagnosis not present

## 2020-01-07 DIAGNOSIS — K91872 Postprocedural seroma of a digestive system organ or structure following a digestive system procedure: Secondary | ICD-10-CM | POA: Diagnosis not present

## 2020-01-07 DIAGNOSIS — F419 Anxiety disorder, unspecified: Secondary | ICD-10-CM | POA: Diagnosis not present

## 2020-01-07 DIAGNOSIS — F209 Schizophrenia, unspecified: Secondary | ICD-10-CM | POA: Diagnosis not present

## 2020-01-07 DIAGNOSIS — D509 Iron deficiency anemia, unspecified: Secondary | ICD-10-CM | POA: Diagnosis not present

## 2020-01-07 DIAGNOSIS — D869 Sarcoidosis, unspecified: Secondary | ICD-10-CM | POA: Diagnosis not present

## 2020-01-08 ENCOUNTER — Other Ambulatory Visit: Payer: Self-pay

## 2020-01-08 ENCOUNTER — Encounter: Payer: Medicare Other | Attending: General Surgery | Admitting: Skilled Nursing Facility1

## 2020-01-08 DIAGNOSIS — E669 Obesity, unspecified: Secondary | ICD-10-CM | POA: Insufficient documentation

## 2020-01-08 NOTE — Progress Notes (Signed)
Bariatric Nutrition Follow-Up Visit Medical Nutrition Therapy  Appt Start Time: 2:36 pm  End Time:   NUTRITION ASSESSMENT Anthropometrics  Start weight at [facility]: 315 lbs (date: 11/06/2013) Today's weight: 239.3 lbs Weight change:  lbs (since previous nutrition visit)  Clinical  Medical hx: bipolar, anxiety Medications: see list Labs:    Lifestyle & Dietary Hx   Pt states she was losing weight. Pt states she has low albumin because of which her surgery was postponed. Pt states her weight is fluctuating between 254 and 239 in every 2 weeks. Pt states her alblumin is between 3.5-5.Pt states her doctor suggested to lose 10 lbs and increase protein label for her to heal after surgery. Pt states she likes cheese and likes extra cheese on her salad. Pt states she does not like drink water much and she feels tired and dizzy. Pt states she hates water.  Pt arrives with a wound vac.     Body Composition Scale 06/15/2019  Current Body Weight 254.8  Total Body Fat % 46.1  Visceral Fat 17  Fat-Free Mass % 53.8   Total Body Water % 41.4  Muscle-Mass lbs 31.3  BMI 42.1  Body Fat Displacement          Torso  lbs 72.9         Left Leg  lbs 14.5         Right Leg  lbs 14.5         Left Arm  lbs 7.2         Right Arm   lbs 7.2    Estimated daily fluid intake:   Estimated daily protein intake:  Supplements: Iron Current average weekly physical activity: None reported  24-Hr Dietary Recall First Meal: protein shake and ginger ale, 2 sundrop Breakfast: becon, cheese  Lunch:Protein shake and protein bar and sundrop Dinner: noodles and spegetti sauce Beverages: soda, juice    Post-Op Goals/ Signs/ Symptoms Using straws: no Drinking while eating: no Chewing/swallowing difficulties: no Changes in vision: no Changes to mood/headaches: no Hair loss/changes to skin/nails: no Difficulty focusing/concentrating: no Sweating: no Dizziness/lightheadedness: no Palpitations: no   Carbonated/caffeinated beverages: no N/V/D/C/Gas: no Abdominal pain: no Dumping syndrome: no    NUTRITION DIAGNOSIS  Overweight/obesity ([place]-3.3) related to past poor dietary habits and physical inactivity as evidenced by completed bariatric surgery and following dietary guidelines for continued weight loss and healthy nutrition status.     NUTRITION INTERVENTION Nutrition counseling (C-1) and education (E-2) to facilitate bariatric surgery goals, including: . The importance of consuming adequate calories as well as certain nutrients daily due to the body's need for essential vitamins, minerals, and fats . The importance of daily physical activity and to reach a goal of at least 150 minutes of moderate to vigorous physical activity weekly (or as directed by their physician) due to benefits such as increased musculature and improved lab values Why should you not wait past 5 hours to eat?: Eating 3-5 hours after your meals will help control blood glucose (sugar) levels and helps control your hunger. Having too large of a gap between meals will cause you to be very hungry and eat too much; this can make you sick if you have had bariatric surgery and/or can cause larger fluctuations in your blood glucose levels if you have diabetes. Those who struggle with hypoglycemia (low blood glucose) will experience more hypoglycemic events if they do not have a consistent meal schedule.  NCM, [email or web address],  Goals: -Continue: Eat healthier fast food  options for 2-3 meals.(using handout) -Continue: Reduce dessert intake, saying 'no' to your mom's dessert. -Continue to NOT eat bread with your meal -Continue to limit fast food -Continue to avoid your [place] tea -Continue to grocery shop at least every other week -Continue: do not eat pork rinds daily and do not eat the entire bag -continue: 1 yogurt at  night would be a fine snack -Continue: Do NOT eat anything in your bed even if you think of it as healthy -NEW: be sure to have non starchy vegetables your pizza and fish meal  New goals  CNG:FREVQ sundrop to one/day, Limit orange juice to 1 glass and dilute it with ice or water  NEW: limit cheese NEW: Drink 4 water bottles per day NEW: Make sure you eat breakfast and dinner everyday NEW: drink 1 protein shake per day with a protein bar for lunch every day   Handouts Provided Include   N/A  Learning Style & Readiness for Change Teaching method utilized: Visual & Auditory  Demonstrated degree of understanding via: Teach Back  Barriers to learning/adherence to lifestyle change: pre contemplative stage of change  RD's Notes for Next Visit . Assess adherence to pt chosen goals . Revisit barriers to success     MONITORING & EVALUATION Dietary intake, weekly physical activity, body weight.  Next Steps Patient is to follow-up in 2 weeks

## 2020-01-09 DIAGNOSIS — G8928 Other chronic postprocedural pain: Secondary | ICD-10-CM | POA: Diagnosis not present

## 2020-01-09 DIAGNOSIS — I6930 Unspecified sequelae of cerebral infarction: Secondary | ICD-10-CM | POA: Diagnosis not present

## 2020-01-09 DIAGNOSIS — R519 Headache, unspecified: Secondary | ICD-10-CM | POA: Diagnosis not present

## 2020-01-09 DIAGNOSIS — D869 Sarcoidosis, unspecified: Secondary | ICD-10-CM | POA: Diagnosis not present

## 2020-01-09 DIAGNOSIS — R109 Unspecified abdominal pain: Secondary | ICD-10-CM | POA: Diagnosis not present

## 2020-01-09 DIAGNOSIS — Z79891 Long term (current) use of opiate analgesic: Secondary | ICD-10-CM | POA: Diagnosis not present

## 2020-01-09 DIAGNOSIS — D509 Iron deficiency anemia, unspecified: Secondary | ICD-10-CM | POA: Diagnosis not present

## 2020-01-09 DIAGNOSIS — K91872 Postprocedural seroma of a digestive system organ or structure following a digestive system procedure: Secondary | ICD-10-CM | POA: Diagnosis not present

## 2020-01-09 DIAGNOSIS — F209 Schizophrenia, unspecified: Secondary | ICD-10-CM | POA: Diagnosis not present

## 2020-01-09 DIAGNOSIS — F419 Anxiety disorder, unspecified: Secondary | ICD-10-CM | POA: Diagnosis not present

## 2020-01-09 DIAGNOSIS — F319 Bipolar disorder, unspecified: Secondary | ICD-10-CM | POA: Diagnosis not present

## 2020-01-10 ENCOUNTER — Ambulatory Visit: Payer: Medicare Other | Admitting: Primary Care

## 2020-01-10 DIAGNOSIS — T8189XA Other complications of procedures, not elsewhere classified, initial encounter: Secondary | ICD-10-CM | POA: Diagnosis not present

## 2020-01-10 DIAGNOSIS — S31109D Unspecified open wound of abdominal wall, unspecified quadrant without penetration into peritoneal cavity, subsequent encounter: Secondary | ICD-10-CM | POA: Diagnosis not present

## 2020-01-10 DIAGNOSIS — S31109A Unspecified open wound of abdominal wall, unspecified quadrant without penetration into peritoneal cavity, initial encounter: Secondary | ICD-10-CM | POA: Diagnosis not present

## 2020-01-10 DIAGNOSIS — S301XXA Contusion of abdominal wall, initial encounter: Secondary | ICD-10-CM | POA: Diagnosis not present

## 2020-01-10 DIAGNOSIS — D539 Nutritional anemia, unspecified: Secondary | ICD-10-CM | POA: Diagnosis not present

## 2020-01-10 DIAGNOSIS — E46 Unspecified protein-calorie malnutrition: Secondary | ICD-10-CM | POA: Diagnosis not present

## 2020-01-10 DIAGNOSIS — D869 Sarcoidosis, unspecified: Secondary | ICD-10-CM | POA: Diagnosis not present

## 2020-01-11 ENCOUNTER — Telehealth: Payer: Self-pay | Admitting: Emergency Medicine

## 2020-01-11 ENCOUNTER — Telehealth: Payer: Self-pay | Admitting: *Deleted

## 2020-01-11 DIAGNOSIS — D869 Sarcoidosis, unspecified: Secondary | ICD-10-CM | POA: Diagnosis not present

## 2020-01-11 DIAGNOSIS — F319 Bipolar disorder, unspecified: Secondary | ICD-10-CM | POA: Diagnosis not present

## 2020-01-11 DIAGNOSIS — F419 Anxiety disorder, unspecified: Secondary | ICD-10-CM | POA: Diagnosis not present

## 2020-01-11 DIAGNOSIS — K91872 Postprocedural seroma of a digestive system organ or structure following a digestive system procedure: Secondary | ICD-10-CM | POA: Diagnosis not present

## 2020-01-11 DIAGNOSIS — D509 Iron deficiency anemia, unspecified: Secondary | ICD-10-CM | POA: Diagnosis not present

## 2020-01-11 DIAGNOSIS — F209 Schizophrenia, unspecified: Secondary | ICD-10-CM | POA: Diagnosis not present

## 2020-01-11 NOTE — Telephone Encounter (Signed)
Spoke with the pt  She c/o increased SOB with exertion and with "lying down too much" x 2 days  She also c/o wheezing  She denies any f/c/s, body aches  She states that she just finished a round of augmentin  She is not having any cough right now She has pred taper on hand her PCP gave her and wants to know if she should take this  Has had covid vaccine  Please advise, thanks!

## 2020-01-11 NOTE — Telephone Encounter (Signed)
Iron levels appear to be improving. Patient can continued PO iron and high iron diet as tolerated. RTC planned in 6 months time.

## 2020-01-11 NOTE — Telephone Encounter (Signed)
Called and spoke with patient to let her know that she was ok to take prednisone and if her symptoms continued for her to call us for an appointment. Patient stated that she would like to be seen now and feels like there is something going on and that once she takes the prednisone and it is done her symptoms are going to start right back.  Dr. Lamonte Sakai are you ok with me putting her in the 10:30 held slot on Monday 01/15/20?

## 2020-01-11 NOTE — Telephone Encounter (Signed)
Patient states has Prednisone from 12/30/2019. Patient phone number is [phone].

## 2020-01-11 NOTE — Telephone Encounter (Signed)
Received vm message from patient regarding lab results and any interventions that may be needed.  Please advise

## 2020-01-11 NOTE — Telephone Encounter (Signed)
I am ok with her taking the pred. If she continues to have sx after that then she needs to be seen in our office.

## 2020-01-11 NOTE — Telephone Encounter (Signed)
Attempted to call pt but unable to reach. Left message for her to return call. 

## 2020-01-11 NOTE — Telephone Encounter (Signed)
TCT patient 

## 2020-01-12 DIAGNOSIS — F319 Bipolar disorder, unspecified: Secondary | ICD-10-CM | POA: Diagnosis not present

## 2020-01-12 DIAGNOSIS — F419 Anxiety disorder, unspecified: Secondary | ICD-10-CM | POA: Diagnosis not present

## 2020-01-12 DIAGNOSIS — K91872 Postprocedural seroma of a digestive system organ or structure following a digestive system procedure: Secondary | ICD-10-CM | POA: Diagnosis not present

## 2020-01-12 DIAGNOSIS — D509 Iron deficiency anemia, unspecified: Secondary | ICD-10-CM | POA: Diagnosis not present

## 2020-01-12 DIAGNOSIS — D869 Sarcoidosis, unspecified: Secondary | ICD-10-CM | POA: Diagnosis not present

## 2020-01-12 DIAGNOSIS — F209 Schizophrenia, unspecified: Secondary | ICD-10-CM | POA: Diagnosis not present

## 2020-01-12 NOTE — Telephone Encounter (Signed)
TCT patient and spoke with her regarding recent labs.  Advised that her iron stores are improving. She is to continue her oral iron and high iron diet. Pt voiced understanding

## 2020-01-12 NOTE — Telephone Encounter (Signed)
Spoke with patient. She has been scheduled with Beth on 01/17/20 at 430pm. She verbalized understanding. Nothing further needed at time of call.

## 2020-01-12 NOTE — Telephone Encounter (Signed)
There is already someone scheduled at that time.   Get her in with me or APP as soon as we are able without using nodule slot.

## 2020-01-12 NOTE — Telephone Encounter (Signed)
Spoke with patient. She is aware we are waiting RB's response to use the held spot on Monday and will call her back as soon as we hear back from him.   RB, please advise.

## 2020-01-12 NOTE — Telephone Encounter (Signed)
Patient would like to let us know she is having to get new wound vac this morning. Would like appointment on Monday with Dr. Lamonte Sakai. Patient phone number is [phone].

## 2020-01-14 ENCOUNTER — Emergency Department (HOSPITAL_COMMUNITY): Payer: Medicare Other

## 2020-01-14 ENCOUNTER — Telehealth: Payer: Self-pay | Admitting: Critical Care Medicine

## 2020-01-14 ENCOUNTER — Emergency Department (HOSPITAL_COMMUNITY)
Admission: EM | Admit: 2020-01-14 | Discharge: 2020-01-14 | Disposition: A | Discharge status: Home / Self Care | Payer: Medicare Other | Attending: Emergency Medicine | Admitting: Emergency Medicine

## 2020-01-14 DIAGNOSIS — R0602 Shortness of breath: Secondary | ICD-10-CM | POA: Diagnosis not present

## 2020-01-14 DIAGNOSIS — R911 Solitary pulmonary nodule: Secondary | ICD-10-CM | POA: Diagnosis not present

## 2020-01-14 DIAGNOSIS — F419 Anxiety disorder, unspecified: Secondary | ICD-10-CM | POA: Diagnosis not present

## 2020-01-14 DIAGNOSIS — D869 Sarcoidosis, unspecified: Secondary | ICD-10-CM | POA: Diagnosis not present

## 2020-01-14 DIAGNOSIS — Z79899 Other long term (current) drug therapy: Secondary | ICD-10-CM | POA: Insufficient documentation

## 2020-01-14 DIAGNOSIS — I10 Essential (primary) hypertension: Secondary | ICD-10-CM | POA: Diagnosis not present

## 2020-01-14 DIAGNOSIS — R0789 Other chest pain: Secondary | ICD-10-CM | POA: Diagnosis not present

## 2020-01-14 DIAGNOSIS — R918 Other nonspecific abnormal finding of lung field: Secondary | ICD-10-CM | POA: Diagnosis not present

## 2020-01-14 DIAGNOSIS — Z87891 Personal history of nicotine dependence: Secondary | ICD-10-CM | POA: Insufficient documentation

## 2020-01-14 DIAGNOSIS — K91872 Postprocedural seroma of a digestive system organ or structure following a digestive system procedure: Secondary | ICD-10-CM | POA: Diagnosis not present

## 2020-01-14 DIAGNOSIS — F209 Schizophrenia, unspecified: Secondary | ICD-10-CM | POA: Diagnosis not present

## 2020-01-14 DIAGNOSIS — F319 Bipolar disorder, unspecified: Secondary | ICD-10-CM | POA: Diagnosis not present

## 2020-01-14 DIAGNOSIS — D509 Iron deficiency anemia, unspecified: Secondary | ICD-10-CM | POA: Diagnosis not present

## 2020-01-14 DIAGNOSIS — J90 Pleural effusion, not elsewhere classified: Secondary | ICD-10-CM | POA: Diagnosis not present

## 2020-01-14 LAB — BASIC METABOLIC PANEL
Anion gap: 12 (ref 5–15)
BUN: 13 mg/dL (ref 6–20)
CO2: 20 mmol/L — ABNORMAL LOW (ref 22–32)
Calcium: 8.8 mg/dL — ABNORMAL LOW (ref 8.9–10.3)
Chloride: 106 mmol/L (ref 98–111)
Creatinine, Ser: 0.67 mg/dL (ref 0.44–1.00)
GFR, Estimated: >60 mL/min (ref >60)
Glucose, Bld: 172 mg/dL — ABNORMAL HIGH (ref 70–99)
Potassium: 3.4 mmol/L — ABNORMAL LOW (ref 3.5–5.1)
Sodium: 138 mmol/L (ref 135–145)

## 2020-01-14 LAB — I-STAT BETA HCG BLOOD, ED (MC, WL, AP ONLY): I-stat hCG, quantitative: <5 m[IU]/mL (ref <5)

## 2020-01-14 LAB — CBC
HCT: 41 % (ref 36.0–46.0)
Hemoglobin: 12.8 g/dL (ref 12.0–15.0)
MCH: 28.3 pg (ref 26.0–34.0)
MCHC: 31.2 g/dL (ref 30.0–36.0)
MCV: 90.5 fL (ref 80.0–100.0)
Platelets: 380 10*3/uL (ref 150–400)
RBC: 4.53 MIL/uL (ref 3.87–5.11)
RDW: 16.8 % — ABNORMAL HIGH (ref 11.5–15.5)
WBC: 8.6 10*3/uL (ref 4.0–10.5)
nRBC: 0 % (ref 0.0–0.2)

## 2020-01-14 LAB — BRAIN NATRIURETIC PEPTIDE: B Natriuretic Peptide: 52.9 pg/mL (ref 0.0–100.0)

## 2020-01-14 LAB — TROPONIN I (HIGH SENSITIVITY)
Troponin I (High Sensitivity): <2 ng/L (ref <18)
Troponin I (High Sensitivity): <2 ng/L (ref <18)

## 2020-01-14 LAB — D-DIMER, QUANTITATIVE: D-Dimer, Quant: 0.39 ug/mL-FEU (ref 0.00–0.50)

## 2020-01-14 NOTE — Telephone Encounter (Signed)
Patient on the way to the ER. Took prednisone and anxiety medicine today, not helping. Breathing is worse again- SOB. Different than symptoms 2 weeks ago. This feels like pressure, like something standing on the left side of her chest, radiating to her neck, with assocaited facial flushing. She is not driving. I agree with her decision for evaluation in person in the ED and encouraged her to stop and have the driver call [phone] if she does not feel like she can make it the rest of the way in the car. She voiced understanding. Will let Beth and Dr. Lamonte Sakai know in case she is unable to make it to her appointment this week.  Julian Hy, DO 01/14/20 5:32 PM [facility]

## 2020-01-14 NOTE — ED Provider Notes (Signed)
[facility] EMERGENCY DEPARTMENT Provider Note   CSN: [id number] Arrival date & time: 01/14/20  1743     History Chief Complaint  Patient presents with  . Chest Pain    [name] is a 44 y.o. female with past medical history of cerebrovascular disease, schizophrenia, sarcoidosis, panic attack,iron deficiency anemia, presenting to the ED with complaint of chest tightness and shortness of breath since Thursday. Patient states she has had bronchitis a few times since September that was treated with antibiotic and steroid.  She states the steroid normally makes her symptoms better.  Her symptoms on Thursday and today are much different and worse than her prior occurrences.  She took a few of her leftover prednisone and this did not provide any relief when normally does.  She states she feels as though she is gasping for air and has tightness across her chest.  Symptoms are worse with laying down and exertion.  She has somewhat of a cough.  No fevers, nausea, diaphoresis, lower extremity edema.  She has had multiple surgeries to her abdomen after having a chronic abdominal wall seroma removed, she currently has wound VAC in place.  Denies any complications with this.  Last surgery was in May of this year.  She takes Plavix and is compliant.  Denies any known cardiac history, no history of DVT or PE.  The history is provided by the patient.       Past Medical History:  Diagnosis Date  . ADHD   . Anal fistula   . Anxiety   . Arthritis    pt denies 5/3 visit   . Bipolar affective ([place])   . Blood transfusion without reported diagnosis   . Colostomy in place [facility])   . CVA (cerebrovascular accident) ([place])    left cerebellar infarct found accidentally on CT (2020)   . Depression   . Diverticulitis 2008   perforated/ requiring resection  . Fatty liver   . Gallstones   . GERD (gastroesophageal reflux disease)    occ.  Marland Kitchen Headache    after a fall  . Herpes simplex    . History of colon polyps   . History of hiatal hernia    small  . History of kidney stones    cystoscopy  basket removal  . Hypertension   . Iron deficiency anemia   . Obese   . OCD (obsessive compulsive disorder)   . Panic attacks   . Pulmonary nodules   . Sarcoidosis   . Schizophrenia ([place])   . Shortness of breath dyspnea    pt denies on 5/3 visit   . Sleep apnea    mild, does not use c-pap machine  . Substance abuse ([place])    12 years ago-crack cocaine    Patient Active Problem List   Diagnosis Date Noted  . Iron deficiency anemia due to chronic blood loss 11/09/2019  . Sarcoidosis 04/26/2019  . Abnormal CT of the chest 03/23/2019  . Dyspnea 03/23/2019  . Hypokalemia 02/24/2019  . Sepsis ([place]) 02/24/2019  . Cerebellar infarct ([place]) 11/16/2018  . CVA (cerebrovascular accident) ([place])   . Degeneration of lumbar or lumbosacral intervertebral disc 12/13/2014  . Anemia associated with nutritional deficiency 11/12/2014  . Bipolar affective disorder ([place]) 09/28/2014  . Anxiety state 09/28/2014  . S/P laparoscopic sleeve gastrectomy 01/2014 09/28/2014  . Abdominal wall seroma - chronic   . Seroma   . Cholelithiases 04/25/2013  . Cough 09/19/2011  . Smoker 09/19/2011  .  GERD 03/04/2010  . RECTAL BLEEDING 03/04/2010  . DYSPHAGIA UNSPECIFIED 03/04/2010  . DYSPHAGIA 03/04/2010  . RUQ PAIN 03/04/2010    Past Surgical History:  Procedure Laterality Date  . Abdominal wall hernia  07/2007   open repair with lysis of adhesions  . APPLICATION OF WOUND VAC N/A 06/29/2019   Procedure: PLACEMENT OF WOUND VAC;  Surgeon: Greer Pickerel, MD;  Location: [facility];  Service: General;  Laterality: N/A;  . BRONCHIAL BRUSHINGS  04/11/2019   Procedure: BRONCHIAL BRUSHINGS;  Surgeon: Collene Gobble, MD;  Location: [name] ENDOSCOPY;  Service: Pulmonary;;  . BRONCHIAL WASHINGS  04/11/2019   Procedure: BRONCHIAL WASHINGS;  Surgeon: Collene Gobble, MD;  Location: [facility] ENDOSCOPY;  Service: Pulmonary;;  .  COLONOSCOPY  03/13/2010   YQM:VHQIONGE hemorrhoids likely cause of hematochezia, otherwise normal  . COLONOSCOPY  05/24/2019  . COLONOSCOPY WITH ESOPHAGOGASTRODUODENOSCOPY (EGD)  04/2019  . Colostomy reversal  11/2006  . DEBRIDEMENT OF ABDOMINAL WALL ABSCESS N/A 06/29/2019   Procedure: EXCISIONAL DEBRIDEMENT OF ABDOMINAL WALL/SUBCU SEROMA;  Surgeon: Greer Pickerel, MD;  Location: [facility];  Service: General;  Laterality: N/A;  . ESOPHAGOGASTRODUODENOSCOPY  03/13/2010   XBM:[id number] esophagus, status post passage of a Maloney dilator/Small hiatal hernia/ Antral erosions, status post biopsy  . EXAMINATION UNDER ANESTHESIA N/A 05/11/2012   Procedure: EXAM UNDER ANESTHESIA;  Surgeon: Donato Heinz, MD;  Location: [facility];  Service: General;  Laterality: N/A;  . Exploratory laparotomy with resection  2008   colonoscopy  . FINE NEEDLE ASPIRATION  04/11/2019   Procedure: FINE NEEDLE ASPIRATION (FNA) LINEAR;  Surgeon: Collene Gobble, MD;  Location: [facility] ENDOSCOPY;  Service: Pulmonary;;  . FINGER CLOSED REDUCTION Right 08/05/2012   Procedure: CLOSED REDUCTION RIGHT THUMB (FINGER);  Surgeon: Linna Hoff, MD;  Location: [facility];  Service: Orthopedics;  Laterality: Right;  . HERNIA REPAIR     incisional hernia surgery   . Hx of abd wall seroma  08/2007   Drained via [place] in [place], [place]  . Hx of abd wall seroma  10/2007   Drained by Dr. Geroge Baseman in office  . INCISION AND DRAINAGE ABSCESS N/A 06/29/2019   Procedure: INCISION AND DRAINAGE;  Surgeon: Greer Pickerel, MD;  [facility];  Service: General;  Laterality: N/A;  . IR RADIOLOGIST EVAL & MGMT  03/15/2019  . IR RADIOLOGIST EVAL & MGMT  04/06/2019  . Kidney stones    . LAPAROSCOPIC GASTRIC SLEEVE RESECTION N/A 02/13/2014   Procedure: LAPAROSCOPIC GASTRIC SLEEVE RESECTION LYSIS OF ADHESIONS, UPPER ENDOSCOPY;  Surgeon: [name], MD;  Location[facility];  Service: General;  Laterality: N/A;  . LAPAROSCOPY N/A 09/28/2014   Procedure: LAPAROSCOPY  DIAGNOSTIC, INCISION AND DRAINAGE WITH LAPAROSCOPIC EXPLORATION OF ABDOMINAL WALL SEROMA with ultrasound;  Surgeon: Greer Pickerel, MD;  Location: [facility];  Service: General;  Laterality: N/A;  . LUNG BIOPSY  04/11/2019   Procedure: LUNG BIOPSY;  Surgeon: Collene Gobble, MD;  Location: [facility] ENDOSCOPY;  Service: Pulmonary;;  distal trachea  . PLACEMENT OF SETON N/A 05/11/2012   Procedure: PLACEMENT OF SETON;  Surgeon: Donato Heinz, MD;  Location: [facility];  Service: General;  Laterality: N/A;  . root canal 7-16    . TONSILLECTOMY    . TREATMENT FISTULA ANAL     x 2  . UPPER GASTROINTESTINAL ENDOSCOPY  05/24/2019  . VIDEO BRONCHOSCOPY WITH ENDOBRONCHIAL ULTRASOUND N/A 04/11/2019   Procedure: VIDEO BRONCHOSCOPY WITH ENDOBRONCHIAL ULTRASOUND;  Surgeon: Collene Gobble, MD;  Location: [facility] ENDOSCOPY;  Service: Pulmonary;  Laterality: N/A;     OB History   No obstetric history on file.     Family History  Problem Relation Age of Onset  . Asthma Maternal Grandmother   . Colon polyps Maternal Grandmother   . Colon cancer Neg Hx   . Esophageal cancer Neg Hx   . Rectal cancer Neg Hx   . Stomach cancer Neg Hx     Social History   Tobacco Use  . Smoking status: Former Smoker    Packs/day: 0.50    Years: 14.00    Pack years: 7.00    Types: E-cigarettes    Quit date: 12/20/2013    Years since quitting: 6.0  . Smokeless tobacco: Never Used  . Tobacco comment: Vapor cigarettes.  Vaping Use  . Vaping Use: Every day  . Last attempt to quit: 02/24/2019  . Substances: Nicotine, Flavoring  . Devices: started 2015  Substance Use Topics  . Alcohol use: No    Alcohol/week: 0.0 standard drinks  . Drug use: Never    Home Medications Prior to Admission medications   Medication Sig Start Date End Date Taking? Authorizing Provider  albuterol (ACCUNEB) 0.63 MG/3ML nebulizer solution Take 1 ampule by nebulization every 6 (six) hours as needed for wheezing.    [provider]  albuterol  (VENTOLIN HFA) 108 (90 Base) MCG/ACT inhaler INHALE 2 PUFFS INTO THE LUNGS EVERY 4 (FOUR) HOURS AS NEEDED FOR WHEEZING OR SHORTNESS OF BREATH. 01/02/20   Susy Frizzle, MD  amoxicillin-clavulanate (AUGMENTIN) 875-125 MG tablet Take 1 tablet by mouth 2 (two) times daily. 12/12/19   Susy Frizzle, MD  amphetamine-dextroamphetamine (ADDERALL) 15 MG tablet Take 15 mg by mouth 3 (three) times daily.  04/06/19   [provider]  Ascorbic Acid (VITAMIN C PO) Take by mouth.    [provider]  atenolol (TENORMIN) 25 MG tablet Take 25 mg by mouth 2 (two) times daily.  03/21/19   [provider]  atorvastatin (LIPITOR) 40 MG tablet TAKE 1 TABLET BY MOUTH EVERY DAY 09/18/19   Susy Frizzle, MD  azithromycin (ZITHROMAX) 250 MG tablet Take 1 tablet (250 mg total) by mouth daily. Take first 2 tablets together, then 1 every day until finished. 12/31/19   Avegno, Darrelyn Hillock, FNP  clopidogrel (PLAVIX) 75 MG tablet Take 75 mg by mouth daily.    [provider]  diazepam (VALIUM) 2 MG tablet Take 2 mg by mouth every 6 (six) hours as needed for anxiety.    [provider]  ferrous sulfate 325 (65 FE) MG tablet Take 325 mg by mouth daily with breakfast.    [provider]  fexofenadine (ALLEGRA) 180 MG tablet Take 180 mg by mouth daily as needed for rhinitis (sore throat). Patient not taking: Reported on 09/08/2019    [provider]  gabapentin (NEURONTIN) 300 MG capsule Take 300 mg by mouth 3 (three) times daily. 12/23/18   [provider]  Multiple Vitamins-Minerals (MULTIVITAMIN WITH MINERALS) tablet Take 1 tablet by mouth daily.     [provider]  NUVIGIL 250 MG tablet Take 250 mg by mouth daily. 11/14/14   [provider]  ondansetron (ZOFRAN ODT) 4 MG disintegrating tablet Take 1 tablet (4 mg total) by mouth every 8 (eight) hours as needed for nausea or vomiting. 02/06/19   Susy Frizzle, MD  pantoprazole  (PROTONIX) 40 MG tablet Take 1 tablet (40 mg total) by mouth daily. 05/24/19   Fuller Plan,  Pricilla Riffle, MD  predniSONE (DELTASONE) 10 MG tablet Take 2 tablets (20 mg total) by mouth daily. Patient not taking: Reported on 01/04/2020 12/31/19   Emerson Monte, FNP  QUEtiapine (SEROQUEL) 300 MG tablet Take 600 mg by mouth at bedtime.  11/21/14   [provider]  topiramate (TOPAMAX) 100 MG tablet Take 100 mg by mouth at bedtime.  12/31/18   [provider]  traMADol (ULTRAM) 50 MG tablet Take 100 mg by mouth every 6 (six) hours as needed.    [provider]  valACYclovir (VALTREX) 500 MG tablet TAKE 1 TABLET (500 MG TOTAL) BY MOUTH DAILY AS NEEDED (FEVER BLISTER). 09/14/19   Susy Frizzle, MD    Allergies    Patient has no known allergies.  Review of Systems   Review of Systems  Constitutional: Negative for fever.  Respiratory: Positive for cough, chest tightness and shortness of breath.   Cardiovascular: Negative for leg swelling.  All other systems reviewed and are negative.   Physical Exam Updated Vital Signs BP 123/77   Pulse 84   Temp 98.2 F (36.8 C) (Oral)   Resp 15   SpO2 94%   Physical Exam Vitals and nursing note reviewed.  Constitutional:      General: She is not in acute distress.    Appearance: She is well-developed. She is obese. She is not ill-appearing.  HENT:     Head: Normocephalic and atraumatic.  Eyes:     Conjunctiva/sclera: Conjunctivae normal.  Cardiovascular:     Rate and Rhythm: Normal rate and regular rhythm.  Pulmonary:     Effort: Pulmonary effort is normal. No respiratory distress.     Breath sounds: Normal breath sounds.  Abdominal:     General: Bowel sounds are normal.     Palpations: Abdomen is soft.     Comments: Wound vac in place central abdomen  Musculoskeletal:     Right lower leg: No edema.     Left lower leg: No edema.     Comments: No calf TTP  Skin:    General: Skin is warm.  Neurological:     Mental  Status: She is alert.  Psychiatric:        Behavior: Behavior normal.     ED Results / Procedures / Treatments   Labs (all labs ordered are listed, but only abnormal results are displayed) Labs Reviewed  BASIC METABOLIC PANEL - Abnormal; Notable for the following components:      Result Value   Potassium 3.4 (*)    CO2 20 (*)    Glucose, Bld 172 (*)    Calcium 8.8 (*)    All other components within normal limits  CBC - Abnormal; Notable for the following components:   RDW 16.8 (*)    All other components within normal limits  BRAIN NATRIURETIC PEPTIDE  D-DIMER, QUANTITATIVE (NOT AT [facility])  I-STAT BETA HCG BLOOD, ED ([facility], [facility], [facility] ONLY)  TROPONIN I (HIGH SENSITIVITY)  TROPONIN I (HIGH SENSITIVITY)    EKG EKG Interpretation  Date/Time:  Sunday January 14 2020 17:46:47 EST Ventricular Rate:  93 PR Interval:  130 QRS Duration: 80 QT Interval:  340 QTC Calculation: 422 R Axis:   97 Text Interpretation: Normal sinus rhythm Rightward axis Low voltage QRS Septal infarct , age undetermined Abnormal ECG No significant change since last tracing Confirmed by Dorie Rank [phone]) on 01/14/2020 10:13:05 PM   Radiology DG Chest 2 View  Result Date: 01/14/2020 CLINICAL DATA:  Shortness  of breath EXAM: CHEST - 2 VIEW COMPARISON:  09/08/2019, 04/26/2019, CT chest 03/17/2019 FINDINGS: No focal consolidation or pleural effusion. Stable cardiomediastinal silhouette with bilateral hilar enlargement/presumably corresponding to nodes on prior CT. Scattered bilateral pulmonary nodules are better seen on CT. No pneumothorax. IMPRESSION: No active cardiopulmonary disease. Similar appearance of bilateral hilar enlargement, presumably corresponding to adenopathy noted on prior CT Electronically Signed   By: Donavan Foil M.D.   On: 01/14/2020 18:17    Procedures Procedures (including critical care time)  Medications Ordered in ED Medications - No data to display  ED Course  I have reviewed the  triage vital signs and the nursing notes.  Pertinent labs & imaging results that were available during my care of the patient were reviewed by me and considered in my medical decision making (see chart for details).    MDM Rules/Calculators/A&P                          Patient presenting to the emergency department with chest tightness and shortness of breath since Thursday.  Patient seems to have recurring symptoms over the last few months however states this episode is different.  She endorses orthopnea and dyspnea on exertion. Treated at home with some leftover prednisone without any relief.  On exam she appears to be in no distress with normal respiratory effort.  O2 sat is 99% on room air.  Lungs are clear to auscultation bilaterally.  Laboratory work-up is reassuring, no leukocytosis, BMP is without acute significant change from previous, BNP is within normal limits, D-dimer is within normal limits, troponins x2 are negative.  Last x-ray is also negative for acute infiltrate or effusion.  EKG is nonischemic.  Reevaluation, patient remains to have normal respiratory effort, no respiratory distress is noted.  No evidence of life-threatening or emergent cause of patient's symptoms today.  She is appropriate for discharge with outpatient follow-up.   Discussed results, findings, treatment and follow up. Patient advised of return precautions. Patient verbalized understanding and agreed with plan.  Final Clinical Impression(s) / ED Diagnoses Final diagnoses:  Shortness of breath    Rx / DC Orders ED Discharge Orders    None       Corin Formisano, [place] N, PA-C 01/14/20 2214    Dorie Rank, MD 01/15/20 [phone]

## 2020-01-14 NOTE — ED Notes (Signed)
Discharge instructions provided to patient and family. Verbalized understanding. Alert and oriented. IV lock removed. Ambulated with steady gait out of ED with family.

## 2020-01-14 NOTE — ED Triage Notes (Signed)
Pt states on 11/17 she began to have a tightness in her chest with shortness of breath. She has recently been on steroids & antibiotics due to similar symptoms but this episode on the 17th was different.     She also has a wound vac in place due to a slowing healing wound after a surgery she had in may 2021, she had had a lot of I&Ds and surgeries after this on her abdomen.

## 2020-01-14 NOTE — Discharge Instructions (Addendum)
Your blood work and imaging today is all very reassuring. Please follow closely with your primary care provider regarding your visit today. Return for any worsening symptoms.

## 2020-01-15 ENCOUNTER — Encounter (HOSPITAL_COMMUNITY): Payer: Self-pay

## 2020-01-15 ENCOUNTER — Emergency Department (HOSPITAL_COMMUNITY): Payer: Medicare Other

## 2020-01-15 ENCOUNTER — Telehealth: Payer: Self-pay | Admitting: Emergency Medicine

## 2020-01-15 ENCOUNTER — Other Ambulatory Visit: Payer: Self-pay

## 2020-01-15 ENCOUNTER — Emergency Department (HOSPITAL_COMMUNITY)
Admission: EM | Admit: 2020-01-15 | Discharge: 2020-01-15 | Disposition: A | Discharge status: Home / Self Care | Payer: Medicare Other | Attending: Emergency Medicine | Admitting: Emergency Medicine

## 2020-01-15 DIAGNOSIS — F17290 Nicotine dependence, other tobacco product, uncomplicated: Secondary | ICD-10-CM | POA: Insufficient documentation

## 2020-01-15 DIAGNOSIS — Z79899 Other long term (current) drug therapy: Secondary | ICD-10-CM | POA: Insufficient documentation

## 2020-01-15 DIAGNOSIS — I10 Essential (primary) hypertension: Secondary | ICD-10-CM | POA: Diagnosis not present

## 2020-01-15 DIAGNOSIS — R079 Chest pain, unspecified: Secondary | ICD-10-CM | POA: Diagnosis not present

## 2020-01-15 DIAGNOSIS — R0789 Other chest pain: Secondary | ICD-10-CM | POA: Diagnosis not present

## 2020-01-15 DIAGNOSIS — R911 Solitary pulmonary nodule: Secondary | ICD-10-CM | POA: Diagnosis not present

## 2020-01-15 DIAGNOSIS — R0602 Shortness of breath: Secondary | ICD-10-CM | POA: Diagnosis not present

## 2020-01-15 DIAGNOSIS — J90 Pleural effusion, not elsewhere classified: Secondary | ICD-10-CM | POA: Diagnosis not present

## 2020-01-15 DIAGNOSIS — R000 Tachycardia, unspecified: Secondary | ICD-10-CM | POA: Diagnosis not present

## 2020-01-15 DIAGNOSIS — R042 Hemoptysis: Secondary | ICD-10-CM | POA: Diagnosis not present

## 2020-01-15 DIAGNOSIS — R7989 Other specified abnormal findings of blood chemistry: Secondary | ICD-10-CM | POA: Diagnosis not present

## 2020-01-15 DIAGNOSIS — I2699 Other pulmonary embolism without acute cor pulmonale: Secondary | ICD-10-CM | POA: Diagnosis not present

## 2020-01-15 LAB — CBC
HCT: 39.4 % (ref 36.0–46.0)
Hemoglobin: 12.3 g/dL (ref 12.0–15.0)
MCH: 28.2 pg (ref 26.0–34.0)
MCHC: 31.2 g/dL (ref 30.0–36.0)
MCV: 90.4 fL (ref 80.0–100.0)
Platelets: 336 10*3/uL (ref 150–400)
RBC: 4.36 MIL/uL (ref 3.87–5.11)
RDW: 17 % — ABNORMAL HIGH (ref 11.5–15.5)
WBC: 7.6 10*3/uL (ref 4.0–10.5)
nRBC: 0 % (ref 0.0–0.2)

## 2020-01-15 LAB — BASIC METABOLIC PANEL
Anion gap: 10 (ref 5–15)
BUN: 13 mg/dL (ref 6–20)
CO2: 27 mmol/L (ref 22–32)
Calcium: 8.4 mg/dL — ABNORMAL LOW (ref 8.9–10.3)
Chloride: 102 mmol/L (ref 98–111)
Creatinine, Ser: 0.75 mg/dL (ref 0.44–1.00)
GFR, Estimated: >60 mL/min (ref >60)
Glucose, Bld: 89 mg/dL (ref 70–99)
Potassium: 3.3 mmol/L — ABNORMAL LOW (ref 3.5–5.1)
Sodium: 139 mmol/L (ref 135–145)

## 2020-01-15 LAB — I-STAT BETA HCG BLOOD, ED (NOT ORDERABLE): I-stat hCG, quantitative: <5 m[IU]/mL (ref <5)

## 2020-01-15 LAB — HEPATIC FUNCTION PANEL
ALT: 26 U/L (ref 0–44)
AST: 21 U/L (ref 15–41)
Albumin: 3.7 g/dL (ref 3.5–5.0)
Alkaline Phosphatase: 90 U/L (ref 38–126)
Bilirubin, Direct: <0.1 mg/dL (ref 0.0–0.2)
Total Bilirubin: 0.2 mg/dL — ABNORMAL LOW (ref 0.3–1.2)
Total Protein: 6.6 g/dL (ref 6.5–8.1)

## 2020-01-15 LAB — TROPONIN I (HIGH SENSITIVITY)
Troponin I (High Sensitivity): <2 ng/L (ref <18)
Troponin I (High Sensitivity): <2 ng/L (ref <18)

## 2020-01-15 MED ORDER — IOHEXOL 350 MG/ML SOLN
100.0000 mL | Freq: Once | INTRAVENOUS | Status: AC | PRN
  Administered 2020-01-15: 100 mL via INTRAVENOUS

## 2020-01-15 NOTE — Telephone Encounter (Signed)
Thank you I see that she went to ED at [facility]

## 2020-01-15 NOTE — Telephone Encounter (Signed)
Thank you for letting me know

## 2020-01-15 NOTE — ED Provider Notes (Signed)
[place] DEPT Provider Note   CSN: [id number] Arrival date & time: 01/15/20  1522     History Chief Complaint  Patient presents with  . Chest Pain  . Hematemesis    [name] is a 44 y.o. female.  HPI 44 year old female with a history of anxiety, ADHD, bipolar affective disorder, diverticulitis, multiple abdominal surgeries including an abdominal seroma, obesity, OCD, pulmonary nodules, panic attacks, schizophrenia, CVA presents with complaints of feeling of throat tightening since Thursday, ongoing chest pain and shortness of breath and hemoptysis which started today after eating a chicken sandwich.  Patient was seen here in the ER yesterday with complaints of chest tightness and shortness of breath.  She had basic labs, D-dimer, delta troponins done and EKG which were all within normal limits.  Sent home with PCP follow-up.  She returns today with now complaining of coughing up large clots of bright red blood after eating a chicken sandwich at around 2 pm today. States she has been consistently coughing up blood for the last two hours.  She denies any choking or feeling like the sandwich got stuck in her throat.  She denies any vomiting.  She does have a wound VAC in her abdomen that she has had since May.  She did denies any recent travel, no OCP use, no prior history of blood clots.  She does have a history of sarcoidosis.  She is on Plavix and has been compliant with this. She also endorses chronic occasional bright red blood per rectum which she relates to constipation and history of hemorrhoids. She denies any dark stools. Her main concern is the reported hemoptysis.  Denies fevers chills abdominal pain dysuria, hematuria, voice changes, rashes, history of anaphylaxis.    Past Medical History:  Diagnosis Date  . ADHD   . Anal fistula   . Anxiety   . Arthritis    pt denies 5/3 visit   . Bipolar affective ([place])   . Blood transfusion without  reported diagnosis   . Colostomy in place [facility])   . CVA (cerebrovascular accident) ([place])    left cerebellar infarct found accidentally on CT (2020)   . Depression   . Diverticulitis 2008   perforated/ requiring resection  . Fatty liver   . Gallstones   . GERD (gastroesophageal reflux disease)    occ.  Marland Kitchen Headache    after a fall  . Herpes simplex   . History of colon polyps   . History of hiatal hernia    small  . History of kidney stones    cystoscopy  basket removal  . Hypertension   . Iron deficiency anemia   . Obese   . OCD (obsessive compulsive disorder)   . Panic attacks   . Pulmonary nodules   . Sarcoidosis   . Schizophrenia ([place])   . Shortness of breath dyspnea    pt denies on 5/3 visit   . Sleep apnea    mild, does not use c-pap machine  . Substance abuse ([place])    12 years ago-crack cocaine    Patient Active Problem List   Diagnosis Date Noted  . Iron deficiency anemia due to chronic blood loss 11/09/2019  . Sarcoidosis 04/26/2019  . Abnormal CT of the chest 03/23/2019  . Dyspnea 03/23/2019  . Hypokalemia 02/24/2019  . Sepsis ([place]) 02/24/2019  . Cerebellar infarct ([place]) 11/16/2018  . CVA (cerebrovascular accident) ([place])   . Degeneration of lumbar or lumbosacral intervertebral disc 12/13/2014  .  Anemia associated with nutritional deficiency 11/12/2014  . Bipolar affective disorder ([place]) 09/28/2014  . Anxiety state 09/28/2014  . S/P laparoscopic sleeve gastrectomy 01/2014 09/28/2014  . Abdominal wall seroma - chronic   . Seroma   . Cholelithiases 04/25/2013  . Cough 09/19/2011  . Smoker 09/19/2011  . GERD 03/04/2010  . RECTAL BLEEDING 03/04/2010  . DYSPHAGIA UNSPECIFIED 03/04/2010  . DYSPHAGIA 03/04/2010  . RUQ PAIN 03/04/2010    Past Surgical History:  Procedure Laterality Date  . Abdominal wall hernia  07/2007   open repair with lysis of adhesions  . APPLICATION OF WOUND VAC N/A 06/29/2019   Procedure: PLACEMENT OF WOUND VAC;  Surgeon: Greer Pickerel, MD;  [facility];  Service: General;  Laterality: N/A;  . BRONCHIAL BRUSHINGS  04/11/2019   Procedure: BRONCHIAL BRUSHINGS;  Surgeon: Collene Gobble, MD;  Location: [facility] ENDOSCOPY;  Service: Pulmonary;;  . BRONCHIAL WASHINGS  04/11/2019   Procedure: BRONCHIAL WASHINGS;  Surgeon: Collene Gobble, MD;  Location: [facility] ENDOSCOPY;  Service: Pulmonary;;  . COLONOSCOPY  03/13/2010   VQM:[id number] hemorrhoids likely cause of hematochezia, otherwise normal  . COLONOSCOPY  05/24/2019  . COLONOSCOPY WITH ESOPHAGOGASTRODUODENOSCOPY (EGD)  04/2019  . Colostomy reversal  11/2006  . DEBRIDEMENT OF ABDOMINAL WALL ABSCESS N/A 06/29/2019   Procedure: EXCISIONAL DEBRIDEMENT OF ABDOMINAL WALL/SUBCU SEROMA;  Surgeon: Greer Pickerel, MD;  Location[facility];  Service: General;  Laterality: N/A;  . ESOPHAGOGASTRODUODENOSCOPY  03/13/2010   KDT:[id number] esophagus, status post passage of a Maloney dilator/Small hiatal hernia/ Antral erosions, status post biopsy  . EXAMINATION UNDER ANESTHESIA N/A 05/11/2012   Procedure: EXAM UNDER ANESTHESIA;  Surgeon: Donato Heinz, MD;  Location: [facility];  Service: General;  Laterality: N/A;  . Exploratory laparotomy with resection  2008   colonoscopy  . FINE NEEDLE ASPIRATION  04/11/2019   Procedure: FINE NEEDLE ASPIRATION (FNA) LINEAR;  Surgeon: Collene Gobble, MD;  Location: [place] ENDOSCOPY;  Service: Pulmonary;;  . FINGER CLOSED REDUCTION Right 08/05/2012   Procedure: CLOSED REDUCTION RIGHT THUMB (FINGER);  Surgeon: Linna Hoff, MD;  Location: [place];  Service: Orthopedics;  Laterality: Right;  . HERNIA REPAIR     incisional hernia surgery   . Hx of abd wall seroma  08/2007   Drained via [place] in [place], [place]  . Hx of abd wall seroma  10/2007   Drained by Dr. Geroge Baseman in office  . INCISION AND DRAINAGE ABSCESS N/A 06/29/2019   Procedure: INCISION AND DRAINAGE;  Surgeon: Greer Pickerel, MD;  [facility];  Service: General;  Laterality: N/A;  . IR RADIOLOGIST EVAL & MGMT   03/15/2019  . IR RADIOLOGIST EVAL & MGMT  04/06/2019  . Kidney stones    . LAPAROSCOPIC GASTRIC SLEEVE RESECTION N/A 02/13/2014   Procedure: LAPAROSCOPIC GASTRIC SLEEVE RESECTION LYSIS OF ADHESIONS, UPPER ENDOSCOPY;  Surgeon: [name], MD;  [facility];  Service: General;  Laterality: N/A;  . LAPAROSCOPY N/A 09/28/2014   Procedure: LAPAROSCOPY DIAGNOSTIC, INCISION AND DRAINAGE WITH LAPAROSCOPIC EXPLORATION OF ABDOMINAL WALL SEROMA with ultrasound;  Surgeon: Greer Pickerel, MD;  [facility];  Service: General;  Laterality: N/A;  . LUNG BIOPSY  04/11/2019   Procedure: LUNG BIOPSY;  Surgeon: Collene Gobble, MD;  Location: [facility] ENDOSCOPY;  Service: Pulmonary;;  distal trachea  . PLACEMENT OF SETON N/A 05/11/2012   Procedure: PLACEMENT OF SETON;  Surgeon: Donato Heinz, MD;  Location: [facility];  Service: General;  Laterality: N/A;  . root canal 7-16    .  TONSILLECTOMY    . TREATMENT FISTULA ANAL     x 2  . UPPER GASTROINTESTINAL ENDOSCOPY  05/24/2019  . VIDEO BRONCHOSCOPY WITH ENDOBRONCHIAL ULTRASOUND N/A 04/11/2019   Procedure: VIDEO BRONCHOSCOPY WITH ENDOBRONCHIAL ULTRASOUND;  Surgeon: Collene Gobble, MD;  Location: [facility] ENDOSCOPY;  Service: Pulmonary;  Laterality: N/A;     OB History   No obstetric history on file.     Family History  Problem Relation Age of Onset  . Asthma Maternal Grandmother   . Colon polyps Maternal Grandmother   . Colon cancer Neg Hx   . Esophageal cancer Neg Hx   . Rectal cancer Neg Hx   . Stomach cancer Neg Hx     Social History   Tobacco Use  . Smoking status: Former Smoker    Packs/day: 0.50    Years: 14.00    Pack years: 7.00    Types: E-cigarettes    Quit date: 12/20/2013    Years since quitting: 6.0  . Smokeless tobacco: Never Used  . Tobacco comment: Vapor cigarettes.  Vaping Use  . Vaping Use: Every day  . Last attempt to quit: 02/24/2019  . Substances: Nicotine, Flavoring  . Devices: started 2015  Substance Use Topics  . Alcohol use:  No    Alcohol/week: 0.0 standard drinks  . Drug use: Never    Home Medications Prior to Admission medications   Medication Sig Start Date End Date Taking? Authorizing Provider  albuterol (ACCUNEB) 0.63 MG/3ML nebulizer solution Take 1 ampule by nebulization every 6 (six) hours as needed for wheezing.    [provider]  albuterol (VENTOLIN HFA) 108 (90 Base) MCG/ACT inhaler INHALE 2 PUFFS INTO THE LUNGS EVERY 4 (FOUR) HOURS AS NEEDED FOR WHEEZING OR SHORTNESS OF BREATH. 01/02/20   Susy Frizzle, MD  amoxicillin-clavulanate (AUGMENTIN) 875-125 MG tablet Take 1 tablet by mouth 2 (two) times daily. 12/12/19   Susy Frizzle, MD  amphetamine-dextroamphetamine (ADDERALL) 15 MG tablet Take 15 mg by mouth 3 (three) times daily.  04/06/19   [provider]  Ascorbic Acid (VITAMIN C PO) Take by mouth.    [provider]  atenolol (TENORMIN) 25 MG tablet Take 25 mg by mouth 2 (two) times daily.  03/21/19   [provider]  atorvastatin (LIPITOR) 40 MG tablet TAKE 1 TABLET BY MOUTH EVERY DAY 09/18/19   Susy Frizzle, MD  azithromycin (ZITHROMAX) 250 MG tablet Take 1 tablet (250 mg total) by mouth daily. Take first 2 tablets together, then 1 every day until finished. 12/31/19   Avegno, Darrelyn Hillock, FNP  clopidogrel (PLAVIX) 75 MG tablet Take 75 mg by mouth daily.    [provider]  diazepam (VALIUM) 2 MG tablet Take 2 mg by mouth every 6 (six) hours as needed for anxiety.    [provider]  ferrous sulfate 325 (65 FE) MG tablet Take 325 mg by mouth daily with breakfast.    [provider]  fexofenadine (ALLEGRA) 180 MG tablet Take 180 mg by mouth daily as needed for rhinitis (sore throat). Patient not taking: Reported on 09/08/2019    [provider]  gabapentin (NEURONTIN) 300 MG capsule Take 300 mg by mouth 3 (three) times daily. 12/23/18   [provider]  Multiple Vitamins-Minerals (MULTIVITAMIN WITH MINERALS) tablet  Take 1 tablet by mouth daily.     [provider]  NUVIGIL 250 MG tablet Take 250 mg by mouth daily. 11/14/14   [provider]  ondansetron [facility]  ODT) 4 MG disintegrating tablet Take 1 tablet (4 mg total) by mouth every 8 (eight) hours as needed for nausea or vomiting. 02/06/19   Susy Frizzle, MD  pantoprazole (PROTONIX) 40 MG tablet Take 1 tablet (40 mg total) by mouth daily. 05/24/19   Ladene Artist, MD  predniSONE (DELTASONE) 10 MG tablet Take 2 tablets (20 mg total) by mouth daily. Patient not taking: Reported on 01/04/2020 12/31/19   Emerson Monte, FNP  QUEtiapine (SEROQUEL) 300 MG tablet Take 600 mg by mouth at bedtime.  11/21/14   [provider]  topiramate (TOPAMAX) 100 MG tablet Take 100 mg by mouth at bedtime.  12/31/18   [provider]  traMADol (ULTRAM) 50 MG tablet Take 100 mg by mouth every 6 (six) hours as needed.    [provider]  valACYclovir (VALTREX) 500 MG tablet TAKE 1 TABLET (500 MG TOTAL) BY MOUTH DAILY AS NEEDED (FEVER BLISTER). 09/14/19   Susy Frizzle, MD    Allergies    Patient has no known allergies.  Review of Systems   Review of Systems  Constitutional: Negative for chills and fever.  HENT: Negative for ear pain, sore throat, trouble swallowing and voice change.   Eyes: Negative for pain and visual disturbance.  Respiratory: Positive for cough, chest tightness and shortness of breath. Negative for wheezing.   Cardiovascular: Negative for chest pain and palpitations.  Gastrointestinal: Negative for abdominal pain and vomiting.  Genitourinary: Negative for dysuria and hematuria.  Musculoskeletal: Negative for arthralgias and back pain.  Skin: Negative for color change and rash.  Neurological: Negative for seizures and syncope.  All other systems reviewed and are negative.   Physical Exam Updated Vital Signs BP 140/82   Pulse 93   Temp 98.8 F (37.1 C) (Oral)   Resp 18   Ht 5\' 6"  (1.676 m)    Wt 108.9 kg   SpO2 97%   BMI 38.74 kg/m   Physical Exam Vitals and nursing note reviewed.  Constitutional:      General: She is not in acute distress.    Appearance: She is well-developed. She is not ill-appearing, toxic-appearing or diaphoretic.  HENT:     Head: Normocephalic and atraumatic.     Comments: Posterior oropharynx with some trace evidence of dark blood and some crusting around her mouth Eyes:     Conjunctiva/sclera: Conjunctivae normal.  Cardiovascular:     Rate and Rhythm: Normal rate and regular rhythm.     Pulses:          Radial pulses are 2+ on the right side and 2+ on the left side.     Heart sounds: Normal heart sounds. No murmur heard.   Pulmonary:     Effort: Pulmonary effort is normal. No respiratory distress.     Breath sounds: Normal breath sounds. No decreased breath sounds, wheezing, rhonchi or rales.  Chest:     Chest wall: No tenderness.  Abdominal:     Palpations: Abdomen is soft.     Tenderness: There is no abdominal tenderness.     Comments: Wound vac in place, no visible erythema, discharge, nontender  Musculoskeletal:        General: Normal range of motion.     Cervical back: Neck supple.     Right lower leg: No tenderness. No edema.     Left lower leg: No tenderness. No edema.     Comments: Unilateral lower extremities, nontender, nonpitting edema, no noticeable erythema unilateral swelling  Skin:    General: Skin is warm and dry.     Findings: No erythema or rash.  Neurological:     General: No focal deficit present.     Mental Status: She is alert.  Psychiatric:        Mood and Affect: Mood normal.        Behavior: Behavior normal.     ED Results / Procedures / Treatments   Labs (all labs ordered are listed, but only abnormal results are displayed) Labs Reviewed  BASIC METABOLIC PANEL - Abnormal; Notable for the following components:      Result Value   Potassium 3.3 (*)    Calcium 8.4 (*)    All other components within  normal limits  CBC - Abnormal; Notable for the following components:   RDW 17.0 (*)    All other components within normal limits  HEPATIC FUNCTION PANEL - Abnormal; Notable for the following components:   Total Bilirubin 0.2 (*)    All other components within normal limits  I-STAT BETA HCG BLOOD, ED ([facility], [facility], [facility] ONLY)  I-STAT BETA HCG BLOOD, ED (NOT ORDERABLE)  TROPONIN I (HIGH SENSITIVITY)  TROPONIN I (HIGH SENSITIVITY)    EKG None  Radiology DG Chest 2 View  Result Date: 01/15/2020 CLINICAL DATA:  Chest pain history of sarcoid EXAM: CHEST - 2 VIEW COMPARISON:  01/14/2020, 09/08/2019, CT 03/17/2019 FINDINGS: No focal opacity or pleural effusion. Stable cardiomediastinal silhouette with hilar enlargement bilaterally presumably adenopathy. No pneumothorax. IMPRESSION: 1. No acute interval change since radiograph performed yesterday. 2. Bilateral hilar enlargement presumably corresponding to adenopathy. Electronically Signed   By: Donavan Foil M.D.   On: 01/15/2020 15:54   DG Chest 2 View  Result Date: 01/14/2020 CLINICAL DATA:  Shortness of breath EXAM: CHEST - 2 VIEW COMPARISON:  09/08/2019, 04/26/2019, CT chest 03/17/2019 FINDINGS: No focal consolidation or pleural effusion. Stable cardiomediastinal silhouette with bilateral hilar enlargement/presumably corresponding to nodes on prior CT. Scattered bilateral pulmonary nodules are better seen on CT. No pneumothorax. IMPRESSION: No active cardiopulmonary disease. Similar appearance of bilateral hilar enlargement, presumably corresponding to adenopathy noted on prior CT Electronically Signed   By: Donavan Foil M.D.   On: 01/14/2020 18:17   CT Angio Chest PE W and/or Wo Contrast  Result Date: 01/15/2020 CLINICAL DATA:  44 year old female with positive D-dimer. Hemoptysis. Concern for pulmonary embolism. EXAM: CT ANGIOGRAPHY CHEST WITH CONTRAST TECHNIQUE: Multidetector CT imaging of the chest was performed using the standard protocol  during bolus administration of intravenous contrast. Multiplanar CT image reconstructions and MIPs were obtained to evaluate the vascular anatomy. CONTRAST:  170mL OMNIPAQUE IOHEXOL 350 MG/ML SOLN COMPARISON:  Chest CT dated 03/17/2019. FINDINGS: Evaluation of this exam is limited due to respiratory motion artifact. Cardiovascular: There is no cardiomegaly or pericardial effusion. The thoracic aorta is unremarkable. The origins of the great vessels of the aortic arch appear patent. Evaluation of the pulmonary arteries is limited due to respiratory motion artifact. No definite large central pulmonary artery embolus identified. Mediastinum/Nodes: Bilateral hilar and mediastinal adenopathy similar or increased compared to prior CT. The esophagus and the thyroid gland are grossly unremarkable. No mediastinal fluid collection. Lungs/Pleura: Several scattered bilateral pulmonary nodules measure up to 7 mm in the right lower lobe (70/9) similar to prior CT. No new consolidation. There is no pleural effusion pneumothorax. The central airways are patent. Upper Abdomen: Postsurgical changes of gastric bypass. Musculoskeletal: No chest wall abnormality. No acute or significant osseous findings. Review of the  MIP images confirms the above findings. IMPRESSION: 1. No acute intrathoracic pathology. No CT evidence of central pulmonary artery embolus. 2. Bilateral hilar and mediastinal adenopathy slightly increased compared to prior CT. 3. Scattered bilateral pulmonary nodules similar to prior CT. Electronically Signed   By: Anner Crete M.D.   On: 01/15/2020 19:27    Procedures Procedures (including critical care time)  Medications Ordered in ED Medications  iohexol (OMNIPAQUE) 350 MG/ML injection 100 mL (100 mLs Intravenous Contrast Given 01/15/20 1835)    ED Course  I have reviewed the triage vital signs and the nursing notes.  Pertinent labs & imaging results that were available during my care of the patient  were reviewed by me and considered in my medical decision making (see chart for details).    MDM Rules/Calculators/A&P                          12 PM: 44 year old female with complaints of hemoptysis in the setting of several days of chest pain shortness of breath and "throat tightness" Presentation, she is alert, oriented, nontoxic-appearing, no acute distress, resting comfortably in ER bed.  Speaking full sentences without increased work of breathing, nondiaphoretic.  Vitals on arrival with some tachypnea however on my exam she is not tachypneic, breathing at about 15 breaths/min.  On exam, lung sounds clear, abdomen is soft and nontender.  Wound VAC in place.  Differential diagnosis of hemoptysis includes, but is not limited to: bronchitis, bronchiectasis, bronchogenic carcinoma, pulmonary vasculature disease, left ventricular failure, mitral stenosis, pulmonary embolism, AV malformation, diseases of the pulmonary parenchyma including pneumonia, abscess, tuberculosis, Aspergilloma parasitic infection, Goodpasture's or Wegener's granulomatosis, SLE, crack cocaine inhalation, leukemia, anticoagulation or acute cough.   We will start with basic labs, troponin, chest x-ray, EKG and as per discussion with supervising attending Dr. Sedonia Small, will order PE study to rule out PE  Labs reviewed and interpreted by myself -CBC without leukocytosis, normal hemoglobin -Pregnancy negative -BMP with mild hypokalemia and hypocalcemia, no other significant electrode abnormalities -Normal hepatic function test -Delta troponin negative -Pregnancy negative  Imaging reviewed by myself and radiology -Chest x-ray with bilateral hilar adenopathy but no evidence of infection -CT PE study with bilateral hilar adenopathy slightly increased from prior exam, with scattered pulmonary nodules unchanged from prior exam   MDM: Workup reassuring. No evidence of PE, ACS on labs and imaging. No evidence of pneumonia, carcinoma,  etc. Per chart review, patient apparently told her pulmonary doctor that she was taking her seroquel 4 times daily over the last 4 days secondary to anxiety.  Touched base with poison control about this, no evidence of QTc prolongation, normal level of consciousness, no myoclonus on exam, low concern for overdose/ serotonin syndrome. No episodes of hemoptysis or hematemesis here in the ED. Pt requesting water, and tolerated well. She has followup with her pulmonologist tomorrow per the patient's mother. Stable for discharge with return precautions which include worsening hemoptysis, hematemesis, syncope, etc. At this stage in the ED course the patient has been adequately screened and stable for discharge  Case discussed w/ supervising attending Dr. Sedonia Small who is agreeable to the above and disposition     Final Clinical Impression(s) / ED Diagnoses Final diagnoses:  Hemoptysis    Rx / DC Orders ED Discharge Orders    None       Lyndel Safe 01/15/20 2023    Maudie Flakes, MD 01/15/20 2344

## 2020-01-15 NOTE — Telephone Encounter (Signed)
Called and spoke to pt. Pt c/o increase in hemoptysis, sob and chest tightness x 4 days. Pt could not tell me the amount of blood she was coughing up. Pt states felt she needed emergency attention and was on her way to [facility], on [address] when talking on the phone. Pt sounded calm and did not sound in distress. Pt states she has been taking her Seroquel to help alleviate her SOB and chest tightness. Pt states she was taking it 4-5 times per day x 4 days. Informed pt how dangerous this was and to inform the ED that she has been taking her medication this way, pt states she would. Pt aware to not do this anymore.   Will forward to Dr. Lamonte Sakai as Juluis Rainier.

## 2020-01-15 NOTE — ED Triage Notes (Signed)
Pt arrived via walk in, c/o intermittent, non radiating chest pain, coughing up blood and blood in stools x 2 weeks.

## 2020-01-15 NOTE — Discharge Instructions (Addendum)
Your workup today was overall reassuring. Your CT scan of the chest did not show any evidence of blood clots, infections, or any other causes of coughing up blood. Please return to follow-up for new or worsening symptoms. Please follow up with your lung doctor tomorrow.

## 2020-01-16 ENCOUNTER — Telehealth: Payer: Self-pay | Admitting: Primary Care

## 2020-01-16 ENCOUNTER — Ambulatory Visit (INDEPENDENT_AMBULATORY_CARE_PROVIDER_SITE_OTHER): Payer: Medicare Other | Admitting: Primary Care

## 2020-01-16 ENCOUNTER — Telehealth: Payer: Self-pay | Admitting: Gastroenterology

## 2020-01-16 ENCOUNTER — Encounter: Payer: Self-pay | Admitting: Primary Care

## 2020-01-16 VITALS — BP 116/70 | HR 94 | Ht 66.0 in | Wt 239.0 lb

## 2020-01-16 DIAGNOSIS — D869 Sarcoidosis, unspecified: Secondary | ICD-10-CM

## 2020-01-16 DIAGNOSIS — K91872 Postprocedural seroma of a digestive system organ or structure following a digestive system procedure: Secondary | ICD-10-CM | POA: Diagnosis not present

## 2020-01-16 DIAGNOSIS — F209 Schizophrenia, unspecified: Secondary | ICD-10-CM | POA: Diagnosis not present

## 2020-01-16 DIAGNOSIS — F319 Bipolar disorder, unspecified: Secondary | ICD-10-CM | POA: Diagnosis not present

## 2020-01-16 DIAGNOSIS — F419 Anxiety disorder, unspecified: Secondary | ICD-10-CM | POA: Diagnosis not present

## 2020-01-16 DIAGNOSIS — D509 Iron deficiency anemia, unspecified: Secondary | ICD-10-CM | POA: Diagnosis not present

## 2020-01-16 MED ORDER — BENZONATATE 100 MG PO CAPS: 200.0000 mg | ORAL_CAPSULE | Freq: Three times a day (TID) | ORAL | 1 refills | Status: DC | PRN

## 2020-01-16 NOTE — Progress Notes (Signed)
@Patient  ID: [name], female    DOB: 1975-10-23, 44 y.o.   MRN: [id number]  Chief Complaint  Patient presents with  . Follow-up    Pt states she has been sick multiple times since Sept 2021. States she has been vaping off and on and states she has been sick after vaping each time. Pt has been to the ED a couple times due to hemoptysis.    Referring provider: Susy Frizzle, MD  HPI: 44 year old female, former smoker (quit vaping  November 2021). PMH significant for sarcoidosis, mediastinal lymphadenoipathy, scattered pulmonary nodular disease, asthma, OSA not on CPAP, bipolar disease, diverticulitis with prior perforation and resection, colostomy, subcutaneous abdominal wall abscess, CVA (on ASA and plavix). Patient of Dr. Lamonte Sakai, last seen on 09/08/19. She underwent EBUS on 04/11/19 for mediastinal lymphadenopathy and bilateral pulmonary nodular disease. Pathology showed granulomatous inflammation consistent with sarcoidosis, culture negative. She was treated for sarcoid flare in March 2021 with prednisone, cough improved.    01/16/2020- Inteirm  Patient presents today for ED follow-up. She reports increase in shortness of breath, chest tightness and cough since September 2021. She has been treated several times by PCP and our office with antibiotics and prednisone. She last received Azithromycin from an UC on 12/30/19. She reports initial improvement with any treatment and then symptoms return. During this time she was vaping on and off, she finally quit 3-4 weeks ago.   She was seen in ED on 01/15/20 for hemoptysis. CTA on 01/15/20 showed no evidence of pulmonary embolism, no acute intrathoracic pathology. Bilateral hilar and mediastinal adenopathy slightly increased compared to prior CT. Scattered bilateral pulmonary nodules similar to prior. D-dimer and BNP were within normal range. Eosinophils have been elevated between 200-400. No recent ACE level drawn. She has had no further  episodes of hemoptysis. Denies acute respiratory symptoms.    Pulmonary function testing: 11/09/19- normal airflows, no bronchodilator response, evidence of restriction based on decreased RV, decreased diffusion capacity that corrects to normal range when adjusted to alveolar volume.   05/18/19 - mixed restriction and obstruction, no BD response, normal diffusion capacity   Imaging: 03/17/19 CT chest - multiple small pulmonary nodules scattered, solid in appearance, largest 57mm in right lower lobe, multiple enlarged mediastinal and bilateral hilar lymph nodules  06/23/19 CT abdomen - percutaneous pigtail catheter in place, slight interval increase in size of small residual seroma. There are several small sub centerimeter bilateral pulmonary nodules largest 5mm in right lower lobe, unchanged compared to prior.   No Known Allergies  Immunization History  Administered Date(s) Administered  . Hep A / Hep B 07/05/2008, 08/16/2008, 05/10/2009  . Influenza Whole 11/06/2011  . Influenza,inj,Quad PF,6+ Mos 12/13/2014, 12/19/2015, 01/06/2017, 03/22/2018, 12/15/2018, 11/23/2019  . Influenza-Unspecified 11/06/2011, 10/29/2012  . Moderna SARS-COVID-2 Vaccination 10/05/2019  . Tdap 08/18/2010, 10/27/2011    Past Medical History:  Diagnosis Date  . ADHD   . Anal fistula   . Anxiety   . Arthritis    pt denies 5/3 visit   . Bipolar affective ([place])   . Blood transfusion without reported diagnosis   . Colostomy in place [facility])   . CVA (cerebrovascular accident) ([place])    left cerebellar infarct found accidentally on CT (2020)   . Depression   . Diverticulitis 2008   perforated/ requiring resection  . Fatty liver   . Gallstones   . GERD (gastroesophageal reflux disease)    occ.  Marland Kitchen Headache    after a fall  .  Herpes simplex   . History of colon polyps   . History of hiatal hernia    small  . History of kidney stones    cystoscopy  basket removal  . Hypertension   . Iron deficiency anemia    . Obese   . OCD (obsessive compulsive disorder)   . Panic attacks   . Pulmonary nodules   . Sarcoidosis   . Schizophrenia ([place])   . Shortness of breath dyspnea    pt denies on 5/3 visit   . Sleep apnea    mild, does not use c-pap machine  . Substance abuse ([place])    12 years ago-crack cocaine    Tobacco History: Social History   Tobacco Use  Smoking Status Former Smoker  . Packs/day: 0.50  . Years: 14.00  . Pack years: 7.00  . Types: E-cigarettes  . Quit date: 12/20/2013  . Years since quitting: 6.0  Smokeless Tobacco Never Used  Tobacco Comment   Vapor cigarettes.   Counseling given: Not Answered Comment: Vapor cigarettes.   Outpatient Medications Prior to Visit  Medication Sig Dispense Refill  . albuterol (ACCUNEB) 0.63 MG/3ML nebulizer solution Take 1 ampule by nebulization every 6 (six) hours as needed for wheezing.    Marland Kitchen albuterol (VENTOLIN HFA) 108 (90 Base) MCG/ACT inhaler INHALE 2 PUFFS INTO THE LUNGS EVERY 4 (FOUR) HOURS AS NEEDED FOR WHEEZING OR SHORTNESS OF BREATH. 18 each 1  . Ascorbic Acid (VITAMIN C PO) Take by mouth.    Marland Kitchen atenolol (TENORMIN) 25 MG tablet Take 25 mg by mouth 2 (two) times daily.     Marland Kitchen atorvastatin (LIPITOR) 40 MG tablet TAKE 1 TABLET BY MOUTH EVERY DAY 90 tablet 1  . clopidogrel (PLAVIX) 75 MG tablet Take 75 mg by mouth daily.    . diazepam (VALIUM) 2 MG tablet Take 2 mg by mouth every 6 (six) hours as needed for anxiety.    . ferrous sulfate 325 (65 FE) MG tablet Take 325 mg by mouth daily with breakfast.    . gabapentin (NEURONTIN) 300 MG capsule Take 300 mg by mouth 3 (three) times daily.    . Multiple Vitamins-Minerals (MULTIVITAMIN WITH MINERALS) tablet Take 1 tablet by mouth daily.     Marland Kitchen NUVIGIL 250 MG tablet Take 250 mg by mouth daily.  5  . pantoprazole (PROTONIX) 40 MG tablet Take 1 tablet (40 mg total) by mouth daily. 30 tablet 11  . QUEtiapine (SEROQUEL) 300 MG tablet Take 600 mg by mouth at bedtime.     . topiramate (TOPAMAX)  100 MG tablet Take 100 mg by mouth at bedtime.     . traMADol (ULTRAM) 50 MG tablet Take 100 mg by mouth every 6 (six) hours as needed.    . valACYclovir (VALTREX) 500 MG tablet TAKE 1 TABLET (500 MG TOTAL) BY MOUTH DAILY AS NEEDED (FEVER BLISTER). 90 tablet 2  . amphetamine-dextroamphetamine (ADDERALL) 15 MG tablet Take 15 mg by mouth 3 (three) times daily.  (Patient not taking: Reported on 01/16/2020)    . ondansetron (ZOFRAN ODT) 4 MG disintegrating tablet Take 1 tablet (4 mg total) by mouth every 8 (eight) hours as needed for nausea or vomiting. (Patient not taking: Reported on 01/16/2020) 20 tablet 0  . predniSONE (DELTASONE) 10 MG tablet Take 2 tablets (20 mg total) by mouth daily. (Patient not taking: Reported on 01/04/2020) 15 tablet 0  . amoxicillin-clavulanate (AUGMENTIN) 875-125 MG tablet Take 1 tablet by mouth 2 (two) times daily. 20 tablet 0  .  azithromycin (ZITHROMAX) 250 MG tablet Take 1 tablet (250 mg total) by mouth daily. Take first 2 tablets together, then 1 every day until finished. 6 tablet 0  . fexofenadine (ALLEGRA) 180 MG tablet Take 180 mg by mouth daily as needed for rhinitis (sore throat).      Facility-Administered Medications Prior to Visit  Medication Dose Route Frequency Provider Last Rate Last Admin  . cyanocobalamin ((VITAMIN B-12)) injection 1,000 mcg  1,000 mcg Intramuscular Q30 days Susy Frizzle, MD   1,000 mcg at 07/25/19 1041      Review of Systems  Review of Systems  Constitutional: Negative.   Respiratory: Negative for cough, chest tightness, shortness of breath and wheezing.      Physical Exam  BP 116/70 (BP Location: Left Arm, Cuff Size: Large)   Pulse 94   Ht 5\' 6"  (1.676 m)   Wt 239 lb (108.4 kg)   SpO2 96%   BMI 38.58 kg/m  Physical Exam Constitutional:      General: She is not in acute distress.    Appearance: Normal appearance. She is obese.  HENT:     Head: Normocephalic and atraumatic.     Mouth/Throat:     Comments:  Deferred d/t masking Cardiovascular:     Rate and Rhythm: Normal rate and regular rhythm.  Pulmonary:     Effort: Pulmonary effort is normal.     Breath sounds: Normal breath sounds. No wheezing, rhonchi or rales.  Neurological:     General: No focal deficit present.     Mental Status: She is alert and oriented to person, place, and time. Mental status is at baseline.  Psychiatric:        Mood and Affect: Mood normal.        Behavior: Behavior normal.        Thought Content: Thought content normal.        Judgment: Judgment normal.      Lab Results:  CBC    Component Value Date/Time   WBC 7.6 01/15/2020 1630   RBC 4.36 01/15/2020 1630   HGB 12.3 01/15/2020 1630   HGB 12.7 01/04/2020 1432   HGB 12.5 11/12/2014 1359   HCT 39.4 01/15/2020 1630   HCT 38.7 11/12/2014 1359   PLT 336 01/15/2020 1630   PLT 303 01/04/2020 1432   PLT 347 11/12/2014 1359   MCV 90.4 01/15/2020 1630   MCV 87 11/12/2014 1359   MCH 28.2 01/15/2020 1630   MCHC 31.2 01/15/2020 1630   RDW 17.0 (H) 01/15/2020 1630   RDW 13.7 11/12/2014 1359   LYMPHSABS 1.0 01/04/2020 1432   LYMPHSABS 2.0 11/12/2014 1359   MONOABS 0.5 01/04/2020 1432   EOSABS 0.3 01/04/2020 1432   EOSABS 0.4 11/12/2014 1359   BASOSABS 0.0 01/04/2020 1432   BASOSABS 0.0 11/12/2014 1359    BMET    Component Value Date/Time   NA 139 01/15/2020 1630   K 3.3 (L) 01/15/2020 1630   CL 102 01/15/2020 1630   CO2 27 01/15/2020 1630   GLUCOSE 89 01/15/2020 1630   BUN 13 01/15/2020 1630   CREATININE 0.75 01/15/2020 1630   CREATININE 0.76 01/04/2020 1432   CREATININE 0.50 03/06/2019 1108   CALCIUM 8.4 (L) 01/15/2020 1630   GFRNONAA >60 01/15/2020 1630   GFRNONAA >60 01/04/2020 1432   GFRNONAA 119 03/06/2019 1108   GFRAA >60 11/09/2019 0949   GFRAA 138 03/06/2019 1108    BNP    Component Value Date/Time   BNP 52.9 01/14/2020  1800    ProBNP No results found for: PROBNP  Imaging: DG Chest 2 View  Result Date:  01/15/2020 CLINICAL DATA:  Chest pain history of sarcoid EXAM: CHEST - 2 VIEW COMPARISON:  01/14/2020, 09/08/2019, CT 03/17/2019 FINDINGS: No focal opacity or pleural effusion. Stable cardiomediastinal silhouette with hilar enlargement bilaterally presumably adenopathy. No pneumothorax. IMPRESSION: 1. No acute interval change since radiograph performed yesterday. 2. Bilateral hilar enlargement presumably corresponding to adenopathy. Electronically Signed   By: Donavan Foil M.D.   On: 01/15/2020 15:54   DG Chest 2 View  Result Date: 01/14/2020 CLINICAL DATA:  Shortness of breath EXAM: CHEST - 2 VIEW COMPARISON:  09/08/2019, 04/26/2019, CT chest 03/17/2019 FINDINGS: No focal consolidation or pleural effusion. Stable cardiomediastinal silhouette with bilateral hilar enlargement/presumably corresponding to nodes on prior CT. Scattered bilateral pulmonary nodules are better seen on CT. No pneumothorax. IMPRESSION: No active cardiopulmonary disease. Similar appearance of bilateral hilar enlargement, presumably corresponding to adenopathy noted on prior CT Electronically Signed   By: Donavan Foil M.D.   On: 01/14/2020 18:17   CT Angio Chest PE W and/or Wo Contrast  Result Date: 01/15/2020 CLINICAL DATA:  44 year old female with positive D-dimer. Hemoptysis. Concern for pulmonary embolism. EXAM: CT ANGIOGRAPHY CHEST WITH CONTRAST TECHNIQUE: Multidetector CT imaging of the chest was performed using the standard protocol during bolus administration of intravenous contrast. Multiplanar CT image reconstructions and MIPs were obtained to evaluate the vascular anatomy. CONTRAST:  159mL OMNIPAQUE IOHEXOL 350 MG/ML SOLN COMPARISON:  Chest CT dated 03/17/2019. FINDINGS: Evaluation of this exam is limited due to respiratory motion artifact. Cardiovascular: There is no cardiomegaly or pericardial effusion. The thoracic aorta is unremarkable. The origins of the great vessels of the aortic arch appear patent. Evaluation of  the pulmonary arteries is limited due to respiratory motion artifact. No definite large central pulmonary artery embolus identified. Mediastinum/Nodes: Bilateral hilar and mediastinal adenopathy similar or increased compared to prior CT. The esophagus and the thyroid gland are grossly unremarkable. No mediastinal fluid collection. Lungs/Pleura: Several scattered bilateral pulmonary nodules measure up to 7 mm in the right lower lobe (70/9) similar to prior CT. No new consolidation. There is no pleural effusion pneumothorax. The central airways are patent. Upper Abdomen: Postsurgical changes of gastric bypass. Musculoskeletal: No chest wall abnormality. No acute or significant osseous findings. Review of the MIP images confirms the above findings. IMPRESSION: 1. No acute intrathoracic pathology. No CT evidence of central pulmonary artery embolus. 2. Bilateral hilar and mediastinal adenopathy slightly increased compared to prior CT. 3. Scattered bilateral pulmonary nodules similar to prior CT. Electronically Signed   By: Anner Crete M.D.   On: 01/15/2020 19:27     Assessment & Plan:   Sarcoidosis Patient has had several episodes of increased shortness of breath, chest tightness and cough over the last 2 months treated with abx/prednisone. She improves initially and then symptoms return. She was vaping during this period and has since quit 3 weeks ago. She was seen in ED yesterday for hemoptysis, CTA was negative for acute process or PE. Slightly enlarged hilar and mediastinal adenopathy, pulmonary nodules stable. Checking ACE level today. Recommend taking prednisone 20mg  x 2 week; then 10mg  x 2 weeks; then stop. Patient to take OTC Delsym cough syrup q12hrs and tesslone perles 200mg  TID for cough suppression. Stressed importance of patient abstaining from [place]. Will review with Dr. Lamonte Sakai. Needs fu in 4-6 weeks for surgical clearance for seroma repair in January 2022.   Martyn Ehrich,  NP  01/16/2020  

## 2020-01-16 NOTE — Telephone Encounter (Signed)
Will route to BW as FYI

## 2020-01-16 NOTE — Telephone Encounter (Signed)
Patient was in the ED yesterday with hemoptysis.  She was discharged home after imaging and labs.  She has been having intermittent rectal bleeding bright red for about 2 weeks.  Only sees blood with BM.   She is worried she has a hold in her esophagus or her stomach that is causing the bleeding.  She is reassured that the imaging did not note any structural problems.  She denies any hematemesis.  She is encouraged to follow up with pulmonary for the hemoptysis.  She has an appt with pulmonary today.    She will come for office visit on 12/9 for her rectal bleeding.

## 2020-01-16 NOTE — Patient Instructions (Addendum)
  Treating you for suspected sarcoidosis flare   Take 20mg  prednisone x 2 weeks; then decreased to 10mg  x 2 weeks; then stop   Take Delsym cough syrup over the counter EVERY 12 HOURS for cough suppression You can also take tessalon perles three times a day as needed for cough suppression.  Do not take mucinex right now.   *I will discuss plan with Dr. Lamonte Sakai, may change prednisone dosing and/or add an inhaler to help your breathing   Continue to abstain from vaping, this is EXTREMELY important   Orders: Labs today   Follow-up: 4-6 weeks with Dr. Lamonte Sakai only for pre-op clearance

## 2020-01-16 NOTE — Telephone Encounter (Signed)
Pt states that she went to the ED yesterday because she was vomiting blood. She also stated that she has been experiencing rectal bleeding. She would like to be seen asap.

## 2020-01-16 NOTE — Assessment & Plan Note (Addendum)
Patient has had several episodes of increased shortness of breath, chest tightness and cough over the last 2 months treated with abx/prednisone. She improves initially and then symptoms return. She was vaping during this period and has since quit 3 weeks ago. She was seen in ED yesterday for hemoptysis, CTA was negative for acute process or PE. Slightly enlarged hilar and mediastinal adenopathy, pulmonary nodules stable. Checking ACE level today. Recommend taking prednisone 20mg  x 2 week; then 10mg  x 2 weeks; then stop. Patient to take OTC Delsym cough syrup q12hrs and tesslone perles 200mg  TID for cough suppression. Stressed importance of patient abstaining from [place]. Will review with Dr. Lamonte Sakai. Needs fu in 4-6 weeks for surgical clearance for seroma repair in January 2022.

## 2020-01-17 LAB — ANGIOTENSIN CONVERTING ENZYME: Angiotensin-Converting Enzyme: 40 U/L (ref 9–67)

## 2020-01-19 ENCOUNTER — Telehealth: Payer: Self-pay | Admitting: Primary Care

## 2020-01-19 DIAGNOSIS — F419 Anxiety disorder, unspecified: Secondary | ICD-10-CM | POA: Diagnosis not present

## 2020-01-19 DIAGNOSIS — D509 Iron deficiency anemia, unspecified: Secondary | ICD-10-CM | POA: Diagnosis not present

## 2020-01-19 DIAGNOSIS — D869 Sarcoidosis, unspecified: Secondary | ICD-10-CM | POA: Diagnosis not present

## 2020-01-19 DIAGNOSIS — F319 Bipolar disorder, unspecified: Secondary | ICD-10-CM | POA: Diagnosis not present

## 2020-01-19 DIAGNOSIS — K91872 Postprocedural seroma of a digestive system organ or structure following a digestive system procedure: Secondary | ICD-10-CM | POA: Diagnosis not present

## 2020-01-19 DIAGNOSIS — F209 Schizophrenia, unspecified: Secondary | ICD-10-CM | POA: Diagnosis not present

## 2020-01-19 MED ORDER — PREDNISONE 10 MG PO TABS: ORAL_TABLET | ORAL | 0 refills | Status: DC

## 2020-01-19 NOTE — Telephone Encounter (Signed)
Plan from BW on visit from 01/16/20  Treating you for suspected sarcoidosis flare   Take 20mg  prednisone x 2 weeks; then decreased to 10mg  x 2 weeks; then stop   Take Delsym cough syrup over the counter EVERY 12 HOURS for cough suppression You can also take tessalon perles three times a day as needed for cough suppression.  Do not take mucinex right now.   *I will discuss plan with Dr. Lamonte Sakai, may change prednisone dosing and/or add an inhaler to help your breathing   Continue to abstain from vaping, this is EXTREMELY important    BW I didn't see where the prednisone was sent to the pharmacy.  Ok to send this is?  Thanks

## 2020-01-19 NOTE — Telephone Encounter (Signed)
Spoke with the pt and notified of response per [facility]. She states did only had enough pred for one day. I have sent in the new rx. Nothing further needed.

## 2020-01-19 NOTE — Telephone Encounter (Signed)
She told me she had enough to take but we can send it in if she needs a prescription.

## 2020-01-22 DIAGNOSIS — F319 Bipolar disorder, unspecified: Secondary | ICD-10-CM | POA: Diagnosis not present

## 2020-01-22 DIAGNOSIS — D509 Iron deficiency anemia, unspecified: Secondary | ICD-10-CM | POA: Diagnosis not present

## 2020-01-22 DIAGNOSIS — F419 Anxiety disorder, unspecified: Secondary | ICD-10-CM | POA: Diagnosis not present

## 2020-01-22 DIAGNOSIS — F209 Schizophrenia, unspecified: Secondary | ICD-10-CM | POA: Diagnosis not present

## 2020-01-22 DIAGNOSIS — D869 Sarcoidosis, unspecified: Secondary | ICD-10-CM | POA: Diagnosis not present

## 2020-01-22 DIAGNOSIS — K91872 Postprocedural seroma of a digestive system organ or structure following a digestive system procedure: Secondary | ICD-10-CM | POA: Diagnosis not present

## 2020-01-23 ENCOUNTER — Encounter: Payer: Medicare Other | Admitting: Skilled Nursing Facility1

## 2020-01-23 ENCOUNTER — Other Ambulatory Visit: Payer: Self-pay

## 2020-01-23 DIAGNOSIS — E669 Obesity, unspecified: Secondary | ICD-10-CM | POA: Diagnosis not present

## 2020-01-23 NOTE — Progress Notes (Signed)
Bariatric Nutrition Follow-Up Visit Medical Nutrition Therapy    NUTRITION ASSESSMENT Anthropometrics  Start weight at [facility]: 315 lbs (date: 11/06/2013) Today's weight: 239.3 lbs Weight change:  lbs (since previous nutrition visit)  Clinical  Medical hx: bipolar, anxiety Medications: see list Labs:    Lifestyle & Dietary Hx  Pt lives with her son and mother.   Pt states she had colon fistula. Pt states she is getting hemorrhoids stating it bleeds and hurts if she stands for longer than 30 minutes. Pt states she is concerned about her bleeding, pain and numbness. Pt states she is visiting gastroenterologist this week. Pt states she has shortness of breath.   Pt states she ate a lot at thanksgiving and watched television for 20 hrs each day for 3 days. Pt states she ate pies and chips.   Pt states she is not being active because of her lungs and hemorrhoids problem. Dietitian suggested to stand and move more when watching television all day long.     Body Composition Scale 06/15/2019  Current Body Weight 254.8  Total Body Fat % 46.1  Visceral Fat 17  Fat-Free Mass % 53.8   Total Body Water % 41.4  Muscle-Mass lbs 31.3  BMI 42.1  Body Fat Displacement          Torso  lbs 72.9         Left Leg  lbs 14.5         Right Leg  lbs 14.5         Left Arm  lbs 7.2         Right Arm   lbs 7.2    Estimated daily fluid intake:   Estimated daily protein intake:  Supplements: Iron Current average weekly physical activity: None reported  24-Hr Dietary Recall First Meal: protein shake and ginger ale, 2 sundrop Breakfast: becon, cheese  Lunch:Protein shake and protein bar and sundrop Dinner: noodles and spegetti sauce Beverages: soda, juice    Post-Op Goals/ Signs/ Symptoms Using straws: no Drinking while eating: no Chewing/swallowing difficulties: no Changes in vision: no Changes to mood/headaches: no Hair loss/changes to skin/nails: no Difficulty focusing/concentrating:  no Sweating: no Dizziness/lightheadedness: no Palpitations: no  Carbonated/caffeinated beverages: no N/V/D/C/Gas: no Abdominal pain: no Dumping syndrome: no    NUTRITION DIAGNOSIS  Overweight/obesity ([place]-3.3) related to past poor dietary habits and physical inactivity as evidenced by completed bariatric surgery and following dietary guidelines for continued weight loss and healthy nutrition status.     NUTRITION INTERVENTION Nutrition counseling (C-1) and education (E-2) to facilitate bariatric surgery goals, including: . The importance of consuming adequate calories as well as certain nutrients daily due to the body's need for essential vitamins, minerals, and fats . The importance of daily physical activity and to reach a goal of at least 150 minutes of moderate to vigorous physical activity weekly (or as directed by their physician) due to benefits such as increased musculature and improved lab values   New goals Limit sundrop to one/day, Limit orange juice to 1 glass and dilute it with ice or water   limit cheese  Drink 4 water bottles per day  Make sure you eat breakfast and dinner everyday drink 1 protein shake per day with a protein bar for lunch every day  Goals: Move more and stand in between while  watching tv Limit cheese stick to 1 Aim to reduce your binging on tv and food   Handouts Provided Include   N/A  Learning Style &  Readiness for Change Teaching method utilized: Visual & Auditory  Demonstrated degree of understanding via: Teach Back  Barriers to learning/adherence to lifestyle change: pre contemplative stage of change  RD's Notes for Next Visit . Assess adherence to pt chosen goals . Revisit barriers to success     MONITORING & EVALUATION Dietary intake, weekly physical activity, body weight.  Next Steps Patient is to follow-up in 2 weeks

## 2020-01-24 DIAGNOSIS — F319 Bipolar disorder, unspecified: Secondary | ICD-10-CM | POA: Diagnosis not present

## 2020-01-24 DIAGNOSIS — F419 Anxiety disorder, unspecified: Secondary | ICD-10-CM | POA: Diagnosis not present

## 2020-01-24 DIAGNOSIS — F209 Schizophrenia, unspecified: Secondary | ICD-10-CM | POA: Diagnosis not present

## 2020-01-24 DIAGNOSIS — K91872 Postprocedural seroma of a digestive system organ or structure following a digestive system procedure: Secondary | ICD-10-CM | POA: Diagnosis not present

## 2020-01-24 DIAGNOSIS — D869 Sarcoidosis, unspecified: Secondary | ICD-10-CM | POA: Diagnosis not present

## 2020-01-24 DIAGNOSIS — D509 Iron deficiency anemia, unspecified: Secondary | ICD-10-CM | POA: Diagnosis not present

## 2020-01-25 ENCOUNTER — Encounter: Payer: Self-pay | Admitting: Nurse Practitioner

## 2020-01-25 ENCOUNTER — Ambulatory Visit (INDEPENDENT_AMBULATORY_CARE_PROVIDER_SITE_OTHER): Payer: Medicare Other | Admitting: Nurse Practitioner

## 2020-01-25 VITALS — BP 120/70 | HR 103 | Ht 66.0 in | Wt 243.0 lb

## 2020-01-25 DIAGNOSIS — K5900 Constipation, unspecified: Secondary | ICD-10-CM

## 2020-01-25 DIAGNOSIS — K625 Hemorrhage of anus and rectum: Secondary | ICD-10-CM

## 2020-01-25 DIAGNOSIS — K644 Residual hemorrhoidal skin tags: Secondary | ICD-10-CM

## 2020-01-25 DIAGNOSIS — K648 Other hemorrhoids: Secondary | ICD-10-CM | POA: Diagnosis not present

## 2020-01-25 MED ORDER — HYDROCORTISONE (PERIANAL) 2.5 % EX CREA: 1.0000 "application " | TOPICAL_CREAM | Freq: Every day | CUTANEOUS | 1 refills | Status: DC

## 2020-01-25 NOTE — Patient Instructions (Signed)
If you are age 43 or older, your body mass index should be between 23-30. Your Body mass index is 39.22 kg/m. If this is out of the aforementioned range listed, please consider follow up with your Primary Care Provider.  If you are age 46 or younger, your body mass index should be between 19-25. Your Body mass index is 39.22 kg/m. If this is out of the aformentioned range listed, please consider follow up with your Primary Care Provider.   We have sent the following medications to your pharmacy for you to pick up at your convenience: Anusol cream daily at bedtime inside rectum.   Increase your fiber to twice daily   Thank you for choosing me and [facility].  Tye Savoy, NP

## 2020-01-25 NOTE — Progress Notes (Signed)
ASSESSMENT AND PLAN    # 44 yo female with multiple medical problems here with right buttock pain radiating to back of right thigh and also rectal bleeding on plavix. Suspect rectal bleeding secondary to inflamed internal hemorrhoids seen on anoscopy today.  No evidence for perianal abscess. However, there was a thick, yellow exudate in anal canal. I removed some of it but remaining portion obscured part of the view.  --Will treat hemorrhoids with topical steroids.  --Increase Benefiber to once daily --I will see her back in a few weeks.  --At this point I don't think the rectal bleeding and radiating buttock pain are related. Sciatica maybe?   HISTORY OF PRESENT ILLNESS     Primary Gastroenterologist :  Lucio Edward, MD  Chief Complaint : rectal bleeding  [name] is a 44 y.o. female with a complex medical history not limited to  sarcoidosis, perforated diverticulitis in 2008, history of gastric sleeve, history of anal fistula s/p fistulotomy, serrated sessile polyps , external hemorrhoids, ADHD, bipolar affective disorder, schizophrenia, former smoker , CVA on plavix. She is on multiple medications   Patient seen here in Feb 2021 for IDA and positive FIT.  Per that note patient has a history of perforated diverticulitis in 2008 with colostomy, Hartmann's pouch, subsequent takedown.  She developed an incisional hernia with a subcutaneous abdominal wall seroma requiring surgical repair in 2016.  She had a recurrence of the seroma requiring percutaneous drainage in January 2021.  For evaluation of IDA and positive fit patient underwent upper endoscopy and colonoscopy March 2021.  See results below but nothing found to explain the anemia.  Small bowel biopsies negative.  Patient is followed by hematology for iron deficiency anemia.  She gets iron infusions   Patient still has an abdominal drain in place.  It is attached to what appears to be a wound VAC.   She is followed by [facility] wound care. Patient followed by Plastic Surgery but says she has been told that weight loss is needed for successful closure of the wound.   Patient comes in for evaluation of rectal bleeding.  Approximately 2 weeks ago, while in the shower patient felt a perianal bulge.  Around the same time she began having rectal bleeding with bowel movements as well as a toothache type pain at the bottom of her right buttock radiating down back of right thigh. It hurts to stand and also difficult to get comfortable lying down. She has ultram at home but it doesn't help.   Patient seen in ED 11/22 with "hematemesis". Patient says she was not vomiting or coughing up the blood. She feels the blood was coming up into her mouth from her esophagus.  She used several tissues to blot the blood from her tongue . ED told patient she may have bitten her tongue but patient adamantly denies doing that. Bleeding continued for an 1.5 hours.  In the ED her hemoglobin was 12.3 but patient says she had gotten a couple of iron infusions a few weeks prior.  Her BUN was 13.  Patient has chronic constipation. She could go several days without a bowel movement but does not let this happen.  She takes a prune based product or fiber as needed .    Previous Endoscopic Evaluations / Pertinent Studies:  Colonoscopy March 2021 for iron deficiency anemia and positive fit --Complete exam, adequate prep after extensive lavage and suctioning --A few right-sided diverticula --2 sessile polyps 8  to 11 mm in size were removed --Evidence of a prior end-to-end colocolonic anastomosis in the rectosigmoid colon characterized by healthy-appearing mucosa --Internal hemorrhoids  EGD March 2021 for evaluation of iron deficiency anemia -LA Grade A reflux esophagitis with no bleeding. - Erythematous mucosa in the stomach. Biopsied. - Prior gastric sleeve, normal appearing. - Normal duodenal bulb and second portion of the duodenum.  Biopsied.  Surgical [P], colon, transverse and ascending, polyp (2) - SESSILE SERRATED POLYP(S) WITHOUT CYTOLOGIC DYSPLASIA. - NO EVIDENCE OF CARCINOMA. 2. Surgical [P], 2nd portion of duodenum and distal duodenum - DUODENAL MUCOSA WITH SLIGHT BRUNNER GLAND HYPERPLASIA. - NO FEATURES OF SPRUE OR GRANULOMAS. 3. Surgical [P], gastric erythema bx - ANTRAL AND OXYNTIC MUCOSA WITH MILD HYPEREMIA. - WARTHIN-STARRY NEGATIVE FOR HELICOBACTER PYLORI. - NO INTESTINAL METAPLASIA, DYSPLASIA, OR CARCINOMA.   Past Medical History:  Diagnosis Date  . ADHD   . Anal fistula   . Anxiety   . Arthritis    pt denies 5/3 visit   . Bipolar affective ([place])   . Blood transfusion without reported diagnosis   . Colostomy in place [facility])   . CVA (cerebrovascular accident) ([place])    left cerebellar infarct found accidentally on CT (2020)   . Depression   . Diverticulitis 2008   perforated/ requiring resection  . Fatty liver   . Gallstones   . GERD (gastroesophageal reflux disease)    occ.  Marland Kitchen Headache    after a fall  . Herpes simplex   . History of colon polyps   . History of hiatal hernia    small  . History of kidney stones    cystoscopy  basket removal  . Hypertension   . Iron deficiency anemia   . Obese   . OCD (obsessive compulsive disorder)   . Panic attacks   . Pulmonary nodules   . Sarcoidosis   . Schizophrenia ([place])   . Shortness of breath dyspnea    pt denies on 5/3 visit   . Sleep apnea    mild, does not use c-pap machine  . Substance abuse ([place])    12 years ago-crack cocaine    Current Medications, Allergies, Past Surgical History, Family History and Social History were reviewed in [employer] record.   Current Outpatient Medications  Medication Sig Dispense Refill  . albuterol (ACCUNEB) 0.63 MG/3ML nebulizer solution Take 1 ampule by nebulization every 6 (six) hours as needed for wheezing.    Marland Kitchen albuterol (VENTOLIN HFA) 108 (90 Base) MCG/ACT inhaler  INHALE 2 PUFFS INTO THE LUNGS EVERY 4 (FOUR) HOURS AS NEEDED FOR WHEEZING OR SHORTNESS OF BREATH. 18 each 1  . amphetamine-dextroamphetamine (ADDERALL) 15 MG tablet Take 15 mg by mouth 3 (three) times daily.     . Ascorbic Acid (VITAMIN C PO) Take by mouth.    Marland Kitchen atenolol (TENORMIN) 25 MG tablet Take 25 mg by mouth 2 (two) times daily.     Marland Kitchen atorvastatin (LIPITOR) 40 MG tablet TAKE 1 TABLET BY MOUTH EVERY DAY 90 tablet 1  . benzonatate (TESSALON PERLES) 100 MG capsule Take 2 capsules (200 mg total) by mouth 3 (three) times daily as needed for cough. 30 capsule 1  . clopidogrel (PLAVIX) 75 MG tablet Take 75 mg by mouth daily.    . diazepam (VALIUM) 2 MG tablet Take 2 mg by mouth every 6 (six) hours as needed for anxiety.    . ferrous sulfate 325 (65 FE) MG tablet Take 325 mg by mouth daily  with breakfast.    . gabapentin (NEURONTIN) 300 MG capsule Take 300 mg by mouth 3 (three) times daily.    . Multiple Vitamins-Minerals (MULTIVITAMIN WITH MINERALS) tablet Take 1 tablet by mouth daily.     Marland Kitchen NUVIGIL 250 MG tablet Take 250 mg by mouth daily.  5  . ondansetron (ZOFRAN ODT) 4 MG disintegrating tablet Take 1 tablet (4 mg total) by mouth every 8 (eight) hours as needed for nausea or vomiting. 20 tablet 0  . pantoprazole (PROTONIX) 40 MG tablet Take 1 tablet (40 mg total) by mouth daily. 30 tablet 11  . QUEtiapine (SEROQUEL) 300 MG tablet Take 600 mg by mouth at bedtime.     . topiramate (TOPAMAX) 100 MG tablet Take 100 mg by mouth at bedtime.     . traMADol (ULTRAM) 50 MG tablet Take 100 mg by mouth every 6 (six) hours as needed.    . valACYclovir (VALTREX) 500 MG tablet TAKE 1 TABLET (500 MG TOTAL) BY MOUTH DAILY AS NEEDED (FEVER BLISTER). 90 tablet 2   Current Facility-Administered Medications  Medication Dose Route Frequency Provider Last Rate Last Admin  . cyanocobalamin ((VITAMIN B-12)) injection 1,000 mcg  1,000 mcg Intramuscular Q30 days Susy Frizzle, MD   1,000 mcg at 07/25/19 1041     Review of Systems: No chest pain. No shortness of breath. No urinary complaints.   PHYSICAL EXAM :    Wt Readings from Last 3 Encounters:  01/25/20 243 lb (110.2 kg)  01/23/20 242 lb 11.2 oz (110.1 kg)  01/16/20 239 lb (108.4 kg)    BP 120/70   Pulse (!) 103   Ht 5\' 6"  (1.676 m)   Wt 243 lb (110.2 kg)   BMI 39.22 kg/m  Constitutional:  Pleasant female in no acute distress. Psychiatric: Normal mood and affect. Behavior is normal. EENT: Pupils normal.  Conjunctivae are normal. No scleral icterus. Neck supple.  Cardiovascular: Normal rate, regular rhythm. No edema Pulmonary/chest: Effort normal and breath sounds normal. No wheezing, rales or rhonchi. Abdominal: Soft, nondistended, nontender. Bowel sounds active throughout. Wound drain present and connected to vac.  Rectal:  External hemorrhoids. Non tender on DRE. On anoscopy there were inflamed internal hemorrhoids as well as thick yellow mucous.  Neurological: Alert and oriented to person place and time. Skin: Skin is warm and dry. No rashes noted.  Tye Savoy, NP  01/25/2020, 3:02 PM

## 2020-01-26 DIAGNOSIS — F209 Schizophrenia, unspecified: Secondary | ICD-10-CM | POA: Diagnosis not present

## 2020-01-26 DIAGNOSIS — K91872 Postprocedural seroma of a digestive system organ or structure following a digestive system procedure: Secondary | ICD-10-CM | POA: Diagnosis not present

## 2020-01-26 DIAGNOSIS — D509 Iron deficiency anemia, unspecified: Secondary | ICD-10-CM | POA: Diagnosis not present

## 2020-01-26 DIAGNOSIS — F419 Anxiety disorder, unspecified: Secondary | ICD-10-CM | POA: Diagnosis not present

## 2020-01-26 DIAGNOSIS — D869 Sarcoidosis, unspecified: Secondary | ICD-10-CM | POA: Diagnosis not present

## 2020-01-26 DIAGNOSIS — F319 Bipolar disorder, unspecified: Secondary | ICD-10-CM | POA: Diagnosis not present

## 2020-01-26 NOTE — Progress Notes (Signed)
Reviewed and agree with management plan.  Mohammed Mcandrew T. Nyaisha Simao, MD FACG [facility]  

## 2020-01-27 DIAGNOSIS — F988 Other specified behavioral and emotional disorders with onset usually occurring in childhood and adolescence: Secondary | ICD-10-CM | POA: Diagnosis not present

## 2020-01-27 DIAGNOSIS — F419 Anxiety disorder, unspecified: Secondary | ICD-10-CM | POA: Diagnosis not present

## 2020-01-27 DIAGNOSIS — F17200 Nicotine dependence, unspecified, uncomplicated: Secondary | ICD-10-CM | POA: Diagnosis not present

## 2020-01-27 DIAGNOSIS — R918 Other nonspecific abnormal finding of lung field: Secondary | ICD-10-CM | POA: Diagnosis not present

## 2020-01-27 DIAGNOSIS — Z7902 Long term (current) use of antithrombotics/antiplatelets: Secondary | ICD-10-CM | POA: Diagnosis not present

## 2020-01-27 DIAGNOSIS — F209 Schizophrenia, unspecified: Secondary | ICD-10-CM | POA: Diagnosis not present

## 2020-01-27 DIAGNOSIS — K91872 Postprocedural seroma of a digestive system organ or structure following a digestive system procedure: Secondary | ICD-10-CM | POA: Diagnosis not present

## 2020-01-27 DIAGNOSIS — F429 Obsessive-compulsive disorder, unspecified: Secondary | ICD-10-CM | POA: Diagnosis not present

## 2020-01-27 DIAGNOSIS — K219 Gastro-esophageal reflux disease without esophagitis: Secondary | ICD-10-CM | POA: Diagnosis not present

## 2020-01-27 DIAGNOSIS — D869 Sarcoidosis, unspecified: Secondary | ICD-10-CM | POA: Diagnosis not present

## 2020-01-27 DIAGNOSIS — D509 Iron deficiency anemia, unspecified: Secondary | ICD-10-CM | POA: Diagnosis not present

## 2020-01-27 DIAGNOSIS — F319 Bipolar disorder, unspecified: Secondary | ICD-10-CM | POA: Diagnosis not present

## 2020-01-29 DIAGNOSIS — F209 Schizophrenia, unspecified: Secondary | ICD-10-CM | POA: Diagnosis not present

## 2020-01-29 DIAGNOSIS — K91872 Postprocedural seroma of a digestive system organ or structure following a digestive system procedure: Secondary | ICD-10-CM | POA: Diagnosis not present

## 2020-01-29 DIAGNOSIS — D869 Sarcoidosis, unspecified: Secondary | ICD-10-CM | POA: Diagnosis not present

## 2020-01-29 DIAGNOSIS — D509 Iron deficiency anemia, unspecified: Secondary | ICD-10-CM | POA: Diagnosis not present

## 2020-01-29 DIAGNOSIS — F419 Anxiety disorder, unspecified: Secondary | ICD-10-CM | POA: Diagnosis not present

## 2020-01-29 DIAGNOSIS — F319 Bipolar disorder, unspecified: Secondary | ICD-10-CM | POA: Diagnosis not present

## 2020-01-31 DIAGNOSIS — S301XXA Contusion of abdominal wall, initial encounter: Secondary | ICD-10-CM | POA: Diagnosis not present

## 2020-01-31 DIAGNOSIS — D869 Sarcoidosis, unspecified: Secondary | ICD-10-CM | POA: Diagnosis not present

## 2020-01-31 DIAGNOSIS — E46 Unspecified protein-calorie malnutrition: Secondary | ICD-10-CM | POA: Diagnosis not present

## 2020-01-31 DIAGNOSIS — D509 Iron deficiency anemia, unspecified: Secondary | ICD-10-CM | POA: Diagnosis not present

## 2020-01-31 DIAGNOSIS — F419 Anxiety disorder, unspecified: Secondary | ICD-10-CM | POA: Diagnosis not present

## 2020-01-31 DIAGNOSIS — S31109D Unspecified open wound of abdominal wall, unspecified quadrant without penetration into peritoneal cavity, subsequent encounter: Secondary | ICD-10-CM | POA: Diagnosis not present

## 2020-01-31 DIAGNOSIS — K91872 Postprocedural seroma of a digestive system organ or structure following a digestive system procedure: Secondary | ICD-10-CM | POA: Diagnosis not present

## 2020-01-31 DIAGNOSIS — F209 Schizophrenia, unspecified: Secondary | ICD-10-CM | POA: Diagnosis not present

## 2020-01-31 DIAGNOSIS — F319 Bipolar disorder, unspecified: Secondary | ICD-10-CM | POA: Diagnosis not present

## 2020-01-31 DIAGNOSIS — S31109A Unspecified open wound of abdominal wall, unspecified quadrant without penetration into peritoneal cavity, initial encounter: Secondary | ICD-10-CM | POA: Diagnosis not present

## 2020-02-01 ENCOUNTER — Ambulatory Visit: Payer: Medicare Other | Admitting: Nurse Practitioner

## 2020-02-02 DIAGNOSIS — K5900 Constipation, unspecified: Secondary | ICD-10-CM | POA: Diagnosis not present

## 2020-02-02 DIAGNOSIS — K91872 Postprocedural seroma of a digestive system organ or structure following a digestive system procedure: Secondary | ICD-10-CM | POA: Diagnosis not present

## 2020-02-02 DIAGNOSIS — E669 Obesity, unspecified: Secondary | ICD-10-CM | POA: Diagnosis not present

## 2020-02-02 DIAGNOSIS — L7634 Postprocedural seroma of skin and subcutaneous tissue following other procedure: Secondary | ICD-10-CM | POA: Diagnosis not present

## 2020-02-02 DIAGNOSIS — D869 Sarcoidosis, unspecified: Secondary | ICD-10-CM | POA: Diagnosis not present

## 2020-02-02 DIAGNOSIS — F419 Anxiety disorder, unspecified: Secondary | ICD-10-CM | POA: Diagnosis not present

## 2020-02-02 DIAGNOSIS — Z9884 Bariatric surgery status: Secondary | ICD-10-CM | POA: Diagnosis not present

## 2020-02-02 DIAGNOSIS — F209 Schizophrenia, unspecified: Secondary | ICD-10-CM | POA: Diagnosis not present

## 2020-02-02 DIAGNOSIS — F319 Bipolar disorder, unspecified: Secondary | ICD-10-CM | POA: Diagnosis not present

## 2020-02-02 DIAGNOSIS — D509 Iron deficiency anemia, unspecified: Secondary | ICD-10-CM | POA: Diagnosis not present

## 2020-02-05 DIAGNOSIS — D869 Sarcoidosis, unspecified: Secondary | ICD-10-CM | POA: Diagnosis not present

## 2020-02-05 DIAGNOSIS — D509 Iron deficiency anemia, unspecified: Secondary | ICD-10-CM | POA: Diagnosis not present

## 2020-02-05 DIAGNOSIS — F419 Anxiety disorder, unspecified: Secondary | ICD-10-CM | POA: Diagnosis not present

## 2020-02-05 DIAGNOSIS — F319 Bipolar disorder, unspecified: Secondary | ICD-10-CM | POA: Diagnosis not present

## 2020-02-05 DIAGNOSIS — F209 Schizophrenia, unspecified: Secondary | ICD-10-CM | POA: Diagnosis not present

## 2020-02-05 DIAGNOSIS — K91872 Postprocedural seroma of a digestive system organ or structure following a digestive system procedure: Secondary | ICD-10-CM | POA: Diagnosis not present

## 2020-02-07 ENCOUNTER — Other Ambulatory Visit (HOSPITAL_COMMUNITY): Payer: Self-pay | Admitting: Neurology

## 2020-02-07 DIAGNOSIS — F209 Schizophrenia, unspecified: Secondary | ICD-10-CM | POA: Diagnosis not present

## 2020-02-07 DIAGNOSIS — M545 Low back pain, unspecified: Secondary | ICD-10-CM

## 2020-02-07 DIAGNOSIS — K91872 Postprocedural seroma of a digestive system organ or structure following a digestive system procedure: Secondary | ICD-10-CM | POA: Diagnosis not present

## 2020-02-07 DIAGNOSIS — Z79891 Long term (current) use of opiate analgesic: Secondary | ICD-10-CM | POA: Diagnosis not present

## 2020-02-07 DIAGNOSIS — D509 Iron deficiency anemia, unspecified: Secondary | ICD-10-CM | POA: Diagnosis not present

## 2020-02-07 DIAGNOSIS — D869 Sarcoidosis, unspecified: Secondary | ICD-10-CM | POA: Diagnosis not present

## 2020-02-07 DIAGNOSIS — F319 Bipolar disorder, unspecified: Secondary | ICD-10-CM | POA: Diagnosis not present

## 2020-02-07 DIAGNOSIS — R109 Unspecified abdominal pain: Secondary | ICD-10-CM | POA: Diagnosis not present

## 2020-02-07 DIAGNOSIS — F419 Anxiety disorder, unspecified: Secondary | ICD-10-CM | POA: Diagnosis not present

## 2020-02-09 DIAGNOSIS — F419 Anxiety disorder, unspecified: Secondary | ICD-10-CM | POA: Diagnosis not present

## 2020-02-09 DIAGNOSIS — F209 Schizophrenia, unspecified: Secondary | ICD-10-CM | POA: Diagnosis not present

## 2020-02-09 DIAGNOSIS — F319 Bipolar disorder, unspecified: Secondary | ICD-10-CM | POA: Diagnosis not present

## 2020-02-09 DIAGNOSIS — D869 Sarcoidosis, unspecified: Secondary | ICD-10-CM | POA: Diagnosis not present

## 2020-02-09 DIAGNOSIS — K91872 Postprocedural seroma of a digestive system organ or structure following a digestive system procedure: Secondary | ICD-10-CM | POA: Diagnosis not present

## 2020-02-09 DIAGNOSIS — D509 Iron deficiency anemia, unspecified: Secondary | ICD-10-CM | POA: Diagnosis not present

## 2020-02-12 ENCOUNTER — Other Ambulatory Visit: Payer: Self-pay

## 2020-02-12 ENCOUNTER — Telehealth: Payer: Self-pay | Admitting: Nurse Practitioner

## 2020-02-12 DIAGNOSIS — D509 Iron deficiency anemia, unspecified: Secondary | ICD-10-CM | POA: Diagnosis not present

## 2020-02-12 DIAGNOSIS — F319 Bipolar disorder, unspecified: Secondary | ICD-10-CM | POA: Diagnosis not present

## 2020-02-12 DIAGNOSIS — F419 Anxiety disorder, unspecified: Secondary | ICD-10-CM | POA: Diagnosis not present

## 2020-02-12 DIAGNOSIS — D869 Sarcoidosis, unspecified: Secondary | ICD-10-CM | POA: Diagnosis not present

## 2020-02-12 DIAGNOSIS — F209 Schizophrenia, unspecified: Secondary | ICD-10-CM | POA: Diagnosis not present

## 2020-02-12 DIAGNOSIS — K625 Hemorrhage of anus and rectum: Secondary | ICD-10-CM

## 2020-02-12 DIAGNOSIS — K91872 Postprocedural seroma of a digestive system organ or structure following a digestive system procedure: Secondary | ICD-10-CM | POA: Diagnosis not present

## 2020-02-12 DIAGNOSIS — R102 Pelvic and perineal pain: Secondary | ICD-10-CM

## 2020-02-12 DIAGNOSIS — Z01818 Encounter for other preprocedural examination: Secondary | ICD-10-CM

## 2020-02-12 NOTE — Telephone Encounter (Signed)
She had colonoscopy and EGD in 04/2019. A pelvic CT could help to evaluate for perirectal problems such as an abscess. Her PCP could to add this a pelvic CT to the CT already ordered. The CT order needs to be from 1 provider, not multiple providers. An abdominal CT would not be helpful for perirectal problems or her hemorrhoidal bleeding.

## 2020-02-12 NOTE — Telephone Encounter (Signed)
Patient contacted and scheduled for pelvic CT with contrast on 02/27/20 at 10:30.  She will update the BUN and Creatinine when she comes to pick up her instructions. Patient agrees to this plan.

## 2020-02-12 NOTE — Telephone Encounter (Signed)
Called PCP. It is a plain xray of her back. No CT. Can I order the CT. The patient is adamant that is is not hemorrhoids causing her symptoms. She has pain "down low but it is not from my hole."

## 2020-02-12 NOTE — Telephone Encounter (Signed)
Yes, order a pelvic CT

## 2020-02-12 NOTE — Telephone Encounter (Signed)
Would this be reasonable? See the office note from when the patient was seen by APP. Nevin Bloodgood thought the bleeding was likely from hemorrhoids. She did see a yellow exudate in the anal canal. Note mentions perirectal abscess cannot be totally ruled out. Please advise. Thanks.

## 2020-02-12 NOTE — Telephone Encounter (Signed)
Inbound call from patient stating her PCP ordered for to have a CT scan of her back and states since she is still having a lot of rectal bleeding, would like for [place] to go ahead and order a CT scan of her abdomen so she can have both at the same time.  Please call to advise.

## 2020-02-13 ENCOUNTER — Ambulatory Visit (HOSPITAL_COMMUNITY)
Admission: RE | Admit: 2020-02-13 | Discharge: 2020-02-13 | Disposition: A | Discharge status: Home / Self Care | Payer: Medicare Other | Source: Ambulatory Visit | Attending: Neurology | Admitting: Neurology

## 2020-02-13 ENCOUNTER — Other Ambulatory Visit: Payer: Self-pay

## 2020-02-13 DIAGNOSIS — M545 Low back pain, unspecified: Secondary | ICD-10-CM

## 2020-02-14 ENCOUNTER — Telehealth: Payer: Self-pay | Admitting: Gastroenterology

## 2020-02-14 DIAGNOSIS — F209 Schizophrenia, unspecified: Secondary | ICD-10-CM | POA: Diagnosis not present

## 2020-02-14 DIAGNOSIS — D869 Sarcoidosis, unspecified: Secondary | ICD-10-CM | POA: Diagnosis not present

## 2020-02-14 DIAGNOSIS — K91872 Postprocedural seroma of a digestive system organ or structure following a digestive system procedure: Secondary | ICD-10-CM | POA: Diagnosis not present

## 2020-02-14 DIAGNOSIS — F419 Anxiety disorder, unspecified: Secondary | ICD-10-CM | POA: Diagnosis not present

## 2020-02-14 DIAGNOSIS — D509 Iron deficiency anemia, unspecified: Secondary | ICD-10-CM | POA: Diagnosis not present

## 2020-02-14 DIAGNOSIS — F319 Bipolar disorder, unspecified: Secondary | ICD-10-CM | POA: Diagnosis not present

## 2020-02-14 NOTE — Telephone Encounter (Signed)
CT instructions are printed. She will not need to be put with a CMA or nurse.  I did call the patient and explained this but encouraged her to let me know if she did not understand or had other concerns.

## 2020-02-14 NOTE — Telephone Encounter (Signed)
Inbound call from patient stating she is suppose to come and pick up instructions for CT scan she is scheduled to have on 02/27/20.  Said some one will be going over that with her at 11:30 when she comes in but wants to know if she can come in at 8:30am instead.

## 2020-02-15 ENCOUNTER — Encounter: Payer: Medicare Other | Attending: General Surgery | Admitting: Skilled Nursing Facility1

## 2020-02-15 ENCOUNTER — Other Ambulatory Visit (INDEPENDENT_AMBULATORY_CARE_PROVIDER_SITE_OTHER): Payer: Medicare Other

## 2020-02-15 ENCOUNTER — Other Ambulatory Visit: Payer: Self-pay

## 2020-02-15 DIAGNOSIS — Z713 Dietary counseling and surveillance: Secondary | ICD-10-CM | POA: Diagnosis not present

## 2020-02-15 DIAGNOSIS — K912 Postsurgical malabsorption, not elsewhere classified: Secondary | ICD-10-CM | POA: Insufficient documentation

## 2020-02-15 DIAGNOSIS — Z01818 Encounter for other preprocedural examination: Secondary | ICD-10-CM | POA: Diagnosis not present

## 2020-02-15 DIAGNOSIS — Z9884 Bariatric surgery status: Secondary | ICD-10-CM | POA: Diagnosis not present

## 2020-02-15 DIAGNOSIS — E669 Obesity, unspecified: Secondary | ICD-10-CM

## 2020-02-15 LAB — BUN: BUN: 12 mg/dL (ref 6–23)

## 2020-02-15 LAB — CREATININE, SERUM: Creatinine, Ser: 0.62 mg/dL (ref 0.40–1.20)

## 2020-02-15 NOTE — Progress Notes (Signed)
Bariatric Nutrition Follow-Up Visit Medical Nutrition Therapy    NUTRITION ASSESSMENT Anthropometrics  Start weight at [facility]: 315 lbs (date: 11/06/2013) Today's weight: 239.3 lbs  Clinical  Medical hx: bipolar, anxiety Medications: see list Labs:    Lifestyle & Dietary Hx  Pt lives with her son and mother.    Pt arrives stating she has started on octavia where they send the meals and snacks to her and her mother is paying for it for her. Pt was disappointed with the lack of weight loss from starting the diet 5 days ago. Pt states she has cheated on this diet here and there.  Pt states she swears she will not cheat again.  Pt states he is used to looking at her doctors and telling them what to do and what to give her; pt states her wound doctor knows she manipulates doctors and he will not let her manipulate him. Pt states she could slide through with her doctors to have bariatric surgery even though she did not make any changes but this wound doctor will not let her manipulate him.   Dietitian advised pt not to eat all of her octavia in one day, and "pt states ya that's what mama said."  Pt states "sometimes she thinks well shit I am gunna die sooner than later anyway so I might as well vape" Pt states she wants to vape so bad.  Pt states she has not had any sundrop in the last few weeks.  Pt states she has been helping her mom with the babies so she has been up a bit more.  Pt states she sees her would specialist every 3 weeks.    Body Composition Scale 06/15/2019  Current Body Weight 254.8  Total Body Fat % 46.1  Visceral Fat 17  Fat-Free Mass % 53.8   Total Body Water % 41.4  Muscle-Mass lbs 31.3  BMI 42.1  Body Fat Displacement          Torso  lbs 72.9         Left Leg  lbs 14.5         Right Leg  lbs 14.5         Left Arm  lbs 7.2         Right Arm   lbs 7.2    Estimated daily fluid intake:   Estimated daily protein intake:  Supplements: Iron Current average  weekly physical activity: None reported  24-Hr Dietary Recall First Meal: protein shake and ginger ale, 2 sundrop or sausage egg and cheese Breakfast: becon, cheese  Snack: fueling bar Lunch:Protein shake and protein bar and sundrop or taco salad Snack: chips and fueling bar Dinner: noodles and spegetti sauce Beverages: water + crystal light   Post-Op Goals/ Signs/ Symptoms Using straws: no Drinking while eating: no Chewing/swallowing difficulties: no Changes in vision: no Changes to mood/headaches: no Hair loss/changes to skin/nails: no Difficulty focusing/concentrating: no Sweating: no Dizziness/lightheadedness: no Palpitations: no  Carbonated/caffeinated beverages: no N/V/D/C/Gas: no Abdominal pain: no Dumping syndrome: no    NUTRITION DIAGNOSIS  Overweight/obesity ([place]-3.3) related to past poor dietary habits and physical inactivity as evidenced by completed bariatric surgery and following dietary guidelines for continued weight loss and healthy nutrition status.     NUTRITION INTERVENTION Nutrition counseling (C-1) and education (E-2) to facilitate bariatric surgery goals, including: . The importance of daily physical activity and to reach a goal of at least 150 minutes of moderate to vigorous physical activity weekly (or as  directed by their physician) due to benefits such as increased musculature and improved lab values   New goals Continue drinking NO sundrop or juice every day  Drink 5 water bottles per day  Make sure you eat breakfast and dinner everyday drink 1 protein shake per day with a protein bar for lunch every day Have you mom   Goals: Move more and stand in between while  watching tv Limit cheese stick to 1 Aim to reduce your binging on tv and food   Handouts Provided Include   N/A  Learning Style & Readiness for Change Teaching method utilized: Visual & Auditory  Demonstrated degree of understanding via: Teach Back  Barriers to  learning/adherence to lifestyle change: pre contemplative stage of change  RD's Notes for Next Visit . Assess adherence to pt chosen goals . Revisit barriers to success     MONITORING & EVALUATION Dietary intake, weekly physical activity, body weight.  Next Steps Patient is to follow-up in 2 weeks

## 2020-02-16 DIAGNOSIS — D509 Iron deficiency anemia, unspecified: Secondary | ICD-10-CM | POA: Diagnosis not present

## 2020-02-16 DIAGNOSIS — K91872 Postprocedural seroma of a digestive system organ or structure following a digestive system procedure: Secondary | ICD-10-CM | POA: Diagnosis not present

## 2020-02-16 DIAGNOSIS — F209 Schizophrenia, unspecified: Secondary | ICD-10-CM | POA: Diagnosis not present

## 2020-02-16 DIAGNOSIS — D869 Sarcoidosis, unspecified: Secondary | ICD-10-CM | POA: Diagnosis not present

## 2020-02-16 DIAGNOSIS — F319 Bipolar disorder, unspecified: Secondary | ICD-10-CM | POA: Diagnosis not present

## 2020-02-16 DIAGNOSIS — F419 Anxiety disorder, unspecified: Secondary | ICD-10-CM | POA: Diagnosis not present

## 2020-02-18 ENCOUNTER — Ambulatory Visit
Admission: RE | Admit: 2020-02-18 | Discharge: 2020-02-18 | Disposition: A | Discharge status: Home / Self Care | Payer: Medicare Other | Source: Ambulatory Visit | Attending: Family Medicine | Admitting: Family Medicine

## 2020-02-18 ENCOUNTER — Other Ambulatory Visit: Payer: Self-pay

## 2020-02-18 VITALS — BP 121/75 | HR 85 | Temp 97.7°F | Resp 18

## 2020-02-18 DIAGNOSIS — R310 Gross hematuria: Secondary | ICD-10-CM | POA: Insufficient documentation

## 2020-02-18 DIAGNOSIS — N3001 Acute cystitis with hematuria: Secondary | ICD-10-CM | POA: Diagnosis not present

## 2020-02-18 DIAGNOSIS — R300 Dysuria: Secondary | ICD-10-CM | POA: Insufficient documentation

## 2020-02-18 LAB — POCT URINALYSIS DIP (MANUAL ENTRY)
Bilirubin, UA: NEGATIVE
Glucose, UA: NEGATIVE mg/dL
Ketones, POC UA: NEGATIVE mg/dL
Nitrite, UA: NEGATIVE
Protein Ur, POC: 30 mg/dL — AB
Spec Grav, UA: 1.02 (ref 1.010–1.025)
Urobilinogen, UA: 0.2 E.U./dL
pH, UA: 7.5 (ref 5.0–8.0)

## 2020-02-18 MED ORDER — CEPHALEXIN 500 MG PO CAPS: 500.0000 mg | ORAL_CAPSULE | Freq: Two times a day (BID) | ORAL | 0 refills | Status: AC

## 2020-02-18 NOTE — ED Triage Notes (Signed)
Pt presents with c/o dysuria for past few days

## 2020-02-18 NOTE — ED Provider Notes (Signed)
[facility]   CC: UTI  SUBJECTIVE:  [name] is a 44 y.o. female who complains of urinary frequency, urgency and dysuria for the past 3 days.  Patient denies a precipitating event, recent sexual encounter, excessive caffeine intake. Localizes the pain to the lower abdomen. Pain is intermittent and describes it as burning.  Has not tried OTC medications. Has a wound vac in place for tunneling abdominal wound. Is followed at [facility] for this.  Symptoms are made worse with urination. Admits to similar symptoms in the past.  Denies fever, chills, nausea, vomiting, abdominal pain, flank pain, abnormal vaginal discharge or bleeding, hematuria.    LMP: No LMP recorded. (Menstrual status: Irregular Periods).  ROS: As in HPI.  All other pertinent ROS negative.     Past Medical History:  Diagnosis Date  . ADHD   . Anal fistula   . Anxiety   . Arthritis    pt denies 5/3 visit   . Bipolar affective ([place])   . Blood transfusion without reported diagnosis   . Colostomy in place [facility])   . CVA (cerebrovascular accident) ([place])    left cerebellar infarct found accidentally on CT (2020)   . Depression   . Diverticulitis 2008   perforated/ requiring resection  . Fatty liver   . Gallstones   . GERD (gastroesophageal reflux disease)    occ.  Marland Kitchen Headache    after a fall  . Herpes simplex   . History of colon polyps   . History of hiatal hernia    small  . History of kidney stones    cystoscopy  basket removal  . Hypertension   . Iron deficiency anemia   . Obese   . OCD (obsessive compulsive disorder)   . Panic attacks   . Pulmonary nodules   . Sarcoidosis   . Schizophrenia ([place])   . Shortness of breath dyspnea    pt denies on 5/3 visit   . Sleep apnea    mild, does not use c-pap machine  . Substance abuse ([place])    12 years ago-crack cocaine   Past Surgical History:  Procedure Laterality Date  . Abdominal wall hernia  07/2007   open repair with lysis of adhesions  .  APPLICATION OF WOUND VAC N/A 06/29/2019   Procedure: PLACEMENT OF WOUND VAC;  Surgeon: Greer Pickerel, MD;  Location: [facility];  Service: General;  Laterality: N/A;  . BRONCHIAL BRUSHINGS  04/11/2019   Procedure: BRONCHIAL BRUSHINGS;  Surgeon: Collene Gobble, MD;  Location: [facility] ENDOSCOPY;  Service: Pulmonary;;  . BRONCHIAL WASHINGS  04/11/2019   Procedure: BRONCHIAL WASHINGS;  Surgeon: Collene Gobble, MD;  Location: [facility] ENDOSCOPY;  Service: Pulmonary;;  . COLONOSCOPY  03/13/2010   [id number] hemorrhoids likely cause of hematochezia, otherwise normal  . COLONOSCOPY  05/24/2019  . COLONOSCOPY WITH ESOPHAGOGASTRODUODENOSCOPY (EGD)  04/2019  . Colostomy reversal  11/2006  . DEBRIDEMENT OF ABDOMINAL WALL ABSCESS N/A 06/29/2019   Procedure: EXCISIONAL DEBRIDEMENT OF ABDOMINAL WALL/SUBCU SEROMA;  Surgeon: Greer Pickerel, MD;  Location[facility];  Service: General;  Laterality: N/A;  . ESOPHAGOGASTRODUODENOSCOPY  03/13/2010   [id number] esophagus, status post passage of a Maloney dilator/Small hiatal hernia/ Antral erosions, status post biopsy  . EXAMINATION UNDER ANESTHESIA N/A 05/11/2012   Procedure: EXAM UNDER ANESTHESIA;  Surgeon: Donato Heinz, MD;  Location: [facility];  Service: General;  Laterality: N/A;  . Exploratory laparotomy with resection  2008   colonoscopy  . FINE NEEDLE ASPIRATION  04/11/2019   Procedure: FINE NEEDLE ASPIRATION (FNA) LINEAR;  Surgeon: Collene Gobble, MD;  Location: [facility] ENDOSCOPY;  Service: Pulmonary;;  . FINGER CLOSED REDUCTION Right 08/05/2012   Procedure: CLOSED REDUCTION RIGHT THUMB (FINGER);  Surgeon: Linna Hoff, MD;  Location: [place];  Service: Orthopedics;  Laterality: Right;  . HERNIA REPAIR     incisional hernia surgery   . Hx of abd wall seroma  08/2007   Drained via [place] in [place], [place]  . Hx of abd wall seroma  10/2007   Drained by Dr. Geroge Baseman in office  . INCISION AND DRAINAGE ABSCESS N/A 06/29/2019   Procedure: INCISION AND DRAINAGE;  Surgeon: Greer Pickerel, MD;  [facility];  Service: General;  Laterality: N/A;  . IR RADIOLOGIST EVAL & MGMT  03/15/2019  . IR RADIOLOGIST EVAL & MGMT  04/06/2019  . Kidney stones    . LAPAROSCOPIC GASTRIC SLEEVE RESECTION N/A 02/13/2014   Procedure: LAPAROSCOPIC GASTRIC SLEEVE RESECTION LYSIS OF ADHESIONS, UPPER ENDOSCOPY;  Surgeon: [name], MD;  [facility];  Service: General;  Laterality: N/A;  . LAPAROSCOPY N/A 09/28/2014   Procedure: LAPAROSCOPY DIAGNOSTIC, INCISION AND DRAINAGE WITH LAPAROSCOPIC EXPLORATION OF ABDOMINAL WALL SEROMA with ultrasound;  Surgeon: Greer Pickerel, MD;  [facility];  Service: General;  Laterality: N/A;  . LUNG BIOPSY  04/11/2019   Procedure: LUNG BIOPSY;  Surgeon: Collene Gobble, MD;  Location: [facility] ENDOSCOPY;  Service: Pulmonary;;  distal trachea  . PLACEMENT OF SETON N/A 05/11/2012   Procedure: PLACEMENT OF SETON;  Surgeon: Donato Heinz, MD;  Location: [facility];  Service: General;  Laterality: N/A;  . root canal 7-16    . TONSILLECTOMY    . TREATMENT FISTULA ANAL     x 2  . UPPER GASTROINTESTINAL ENDOSCOPY  05/24/2019  . VIDEO BRONCHOSCOPY WITH ENDOBRONCHIAL ULTRASOUND N/A 04/11/2019   Procedure: VIDEO BRONCHOSCOPY WITH ENDOBRONCHIAL ULTRASOUND;  Surgeon: Collene Gobble, MD;  Location: [facility] ENDOSCOPY;  Service: Pulmonary;  Laterality: N/A;   No Known Allergies Current Facility-Administered Medications on File Prior to Encounter  Medication Dose Route Frequency Provider Last Rate Last Admin  . cyanocobalamin ((VITAMIN B-12)) injection 1,000 mcg  1,000 mcg Intramuscular Q30 days Susy Frizzle, MD   1,000 mcg at 07/25/19 1041   Current Outpatient Medications on File Prior to Encounter  Medication Sig Dispense Refill  . albuterol (ACCUNEB) 0.63 MG/3ML nebulizer solution Take 1 ampule by nebulization every 6 (six) hours as needed for wheezing.    Marland Kitchen albuterol (VENTOLIN HFA) 108 (90 Base) MCG/ACT inhaler INHALE 2 PUFFS INTO THE LUNGS EVERY 4 (FOUR) HOURS AS NEEDED  FOR WHEEZING OR SHORTNESS OF BREATH. 18 each 1  . amphetamine-dextroamphetamine (ADDERALL) 15 MG tablet Take 15 mg by mouth 3 (three) times daily.     . Ascorbic Acid (VITAMIN C PO) Take by mouth.    Marland Kitchen atenolol (TENORMIN) 25 MG tablet Take 25 mg by mouth 2 (two) times daily.     Marland Kitchen atorvastatin (LIPITOR) 40 MG tablet TAKE 1 TABLET BY MOUTH EVERY DAY 90 tablet 1  . benzonatate (TESSALON PERLES) 100 MG capsule Take 2 capsules (200 mg total) by mouth 3 (three) times daily as needed for cough. 30 capsule 1  . clopidogrel (PLAVIX) 75 MG tablet Take 75 mg by mouth daily.    . diazepam (VALIUM) 2 MG tablet Take 2 mg by mouth every 6 (six) hours as needed for anxiety.    . ferrous sulfate 325 (65  FE) MG tablet Take 325 mg by mouth daily with breakfast.    . gabapentin (NEURONTIN) 300 MG capsule Take 300 mg by mouth 3 (three) times daily.    . hydrocortisone (ANUSOL-HC) 2.5 % rectal cream Place 1 application rectally at bedtime. 30 g 1  . Multiple Vitamins-Minerals (MULTIVITAMIN WITH MINERALS) tablet Take 1 tablet by mouth daily.     Marland Kitchen NUVIGIL 250 MG tablet Take 250 mg by mouth daily.  5  . ondansetron (ZOFRAN ODT) 4 MG disintegrating tablet Take 1 tablet (4 mg total) by mouth every 8 (eight) hours as needed for nausea or vomiting. 20 tablet 0  . pantoprazole (PROTONIX) 40 MG tablet Take 1 tablet (40 mg total) by mouth daily. 30 tablet 11  . QUEtiapine (SEROQUEL) 300 MG tablet Take 600 mg by mouth at bedtime.     . topiramate (TOPAMAX) 100 MG tablet Take 100 mg by mouth at bedtime.     . traMADol (ULTRAM) 50 MG tablet Take 100 mg by mouth every 6 (six) hours as needed.    . valACYclovir (VALTREX) 500 MG tablet TAKE 1 TABLET (500 MG TOTAL) BY MOUTH DAILY AS NEEDED (FEVER BLISTER). 90 tablet 2   Social History   Socioeconomic History  . Marital status: Single    Spouse name: Not on file  . Number of children: 2  . Years of education: 18   . Highest education level: Not on file  Occupational History   . Occupation: Insurance claims handler: UNEMPLOYED  Tobacco Use  . Smoking status: Former Smoker    Packs/day: 0.50    Years: 14.00    Pack years: 7.00    Types: E-cigarettes    Quit date: 12/20/2013    Years since quitting: 6.1  . Smokeless tobacco: Never Used  . Tobacco comment: Vapor cigarettes.  Vaping Use  . Vaping Use: Every day  . Last attempt to quit: 02/24/2019  . Substances: Nicotine, Flavoring  . Devices: started 2015  Substance and Sexual Activity  . Alcohol use: No    Alcohol/week: 0.0 standard drinks  . Drug use: Never  . Sexual activity: Not Currently    Birth control/protection: Abstinence  Other Topics Concern  . Not on file  Social History Narrative  . Not on file   Social Determinants of Health   Financial Resource Strain: Not on file  Food Insecurity: Not on file  Transportation Needs: Not on file  Physical Activity: Not on file  Stress: Not on file  Social Connections: Not on file  Intimate Partner Violence: Not on file   Family History  Problem Relation Age of Onset  . Asthma Maternal Grandmother   . Colon polyps Maternal Grandmother   . Colon cancer Neg Hx   . Esophageal cancer Neg Hx   . Rectal cancer Neg Hx   . Stomach cancer Neg Hx     OBJECTIVE:  Vitals:   02/18/20 1028  BP: 121/75  Pulse: 85  Resp: 18  Temp: 97.7 F (36.5 C)  TempSrc: Oral  SpO2: 95%   General appearance: AOx3 in no acute distress HEENT: NCAT. Oropharynx clear.  Lungs: clear to auscultation bilaterally without adventitious breath sounds Heart: regular rate and rhythm. Radial pulses 2+ symmetrical bilaterally Abdomen: soft; non-distended; suprapubic tenderness; bowel sounds present; no guarding or rebound tenderness Back: no CVA tenderness Extremities: no edema; symmetrical with no gross deformities Skin: warm and dry Neurologic: Ambulates from chair to exam table without difficulty Psychological: alert and  cooperative; normal mood and affect  Labs  Reviewed  URINE CULTURE - Abnormal; Notable for the following components:      Result Value   Culture   (*)    Value: 80,000 COLONIES/mL GROUP B STREP(S.AGALACTIAE)ISOLATED TESTING AGAINST S. AGALACTIAE NOT ROUTINELY PERFORMED DUE TO PREDICTABILITY OF AMP/PEN/VAN SUSCEPTIBILITY. Performed at [facility], [place] [address] [place], [place] [address]    All other components within normal limits  POCT URINALYSIS DIP (MANUAL ENTRY) - Abnormal; Notable for the following components:   Color, UA red (*)    Clarity, UA cloudy (*)    Blood, UA large (*)    Protein Ur, POC =30 (*)    Leukocytes, UA Trace (*)    All other components within normal limits    ASSESSMENT & PLAN:  1. Acute cystitis with hematuria   2. Dysuria   3. Gross hematuria     Meds ordered this encounter  Medications  . cephALEXin (KEFLEX) 500 MG capsule    Sig: Take 1 capsule (500 mg total) by mouth 2 (two) times daily for 7 days.    Dispense:  14 capsule    Refill:  0    Order Specific Question:   Supervising Provider    Answer:   Chase Picket [[id number]]   Prescribed Keflex Urine culture sent  We will call you with abnormal results that need further treatment Push fluids and get plenty of rest Take antibiotic as directed and to completion Take pyridium as needed for symptomatic relief Follow up with PCP if symptoms persists Return here or go to ER if you have any new or worsening symptoms such as fever, worsening abdominal pain, nausea/vomiting, flank pain  Outlined signs and symptoms indicating need for more acute intervention Patient verbalized understanding After Visit Summary given     Faustino Congress, NP 02/20/20 0818

## 2020-02-18 NOTE — Discharge Instructions (Signed)
I have sent in Keflex for you to take twice a day for 7 days  We will culture your urine and if there needs to be a change in medication, we will call and let you know  Follow up with this office or with primary care if symptoms are persisting.  Follow up in the ER for high fever, trouble swallowing, trouble breathing, other concerning symptoms.

## 2020-02-19 DIAGNOSIS — D509 Iron deficiency anemia, unspecified: Secondary | ICD-10-CM | POA: Diagnosis not present

## 2020-02-19 DIAGNOSIS — F319 Bipolar disorder, unspecified: Secondary | ICD-10-CM | POA: Diagnosis not present

## 2020-02-19 DIAGNOSIS — D869 Sarcoidosis, unspecified: Secondary | ICD-10-CM | POA: Diagnosis not present

## 2020-02-19 DIAGNOSIS — F209 Schizophrenia, unspecified: Secondary | ICD-10-CM | POA: Diagnosis not present

## 2020-02-19 DIAGNOSIS — K91872 Postprocedural seroma of a digestive system organ or structure following a digestive system procedure: Secondary | ICD-10-CM | POA: Diagnosis not present

## 2020-02-19 DIAGNOSIS — F419 Anxiety disorder, unspecified: Secondary | ICD-10-CM | POA: Diagnosis not present

## 2020-02-19 LAB — URINE CULTURE: Culture: 80000 — AB

## 2020-02-20 DIAGNOSIS — R1902 Left upper quadrant abdominal swelling, mass and lump: Secondary | ICD-10-CM | POA: Diagnosis not present

## 2020-02-20 DIAGNOSIS — S31109D Unspecified open wound of abdominal wall, unspecified quadrant without penetration into peritoneal cavity, subsequent encounter: Secondary | ICD-10-CM | POA: Diagnosis not present

## 2020-02-20 DIAGNOSIS — D869 Sarcoidosis, unspecified: Secondary | ICD-10-CM | POA: Diagnosis not present

## 2020-02-22 ENCOUNTER — Ambulatory Visit: Payer: Medicare Other | Admitting: Emergency Medicine

## 2020-02-23 DIAGNOSIS — D509 Iron deficiency anemia, unspecified: Secondary | ICD-10-CM | POA: Diagnosis not present

## 2020-02-23 DIAGNOSIS — K91872 Postprocedural seroma of a digestive system organ or structure following a digestive system procedure: Secondary | ICD-10-CM | POA: Diagnosis not present

## 2020-02-23 DIAGNOSIS — D869 Sarcoidosis, unspecified: Secondary | ICD-10-CM | POA: Diagnosis not present

## 2020-02-23 DIAGNOSIS — F419 Anxiety disorder, unspecified: Secondary | ICD-10-CM | POA: Diagnosis not present

## 2020-02-23 DIAGNOSIS — F209 Schizophrenia, unspecified: Secondary | ICD-10-CM | POA: Diagnosis not present

## 2020-02-23 DIAGNOSIS — F319 Bipolar disorder, unspecified: Secondary | ICD-10-CM | POA: Diagnosis not present

## 2020-02-26 DIAGNOSIS — R1902 Left upper quadrant abdominal swelling, mass and lump: Secondary | ICD-10-CM | POA: Diagnosis not present

## 2020-02-27 ENCOUNTER — Telehealth: Payer: Self-pay | Admitting: Nurse Practitioner

## 2020-02-27 ENCOUNTER — Ambulatory Visit (HOSPITAL_COMMUNITY)
Admission: RE | Admit: 2020-02-27 | Discharge: 2020-02-27 | Disposition: A | Discharge status: Home / Self Care | Payer: Medicare Other | Source: Ambulatory Visit | Attending: Gastroenterology | Admitting: Gastroenterology

## 2020-02-27 ENCOUNTER — Ambulatory Visit (INDEPENDENT_AMBULATORY_CARE_PROVIDER_SITE_OTHER): Payer: Medicare Other | Admitting: Family Medicine

## 2020-02-27 ENCOUNTER — Other Ambulatory Visit: Payer: Self-pay

## 2020-02-27 ENCOUNTER — Encounter (HOSPITAL_COMMUNITY): Payer: Self-pay

## 2020-02-27 DIAGNOSIS — R102 Pelvic and perineal pain: Secondary | ICD-10-CM | POA: Insufficient documentation

## 2020-02-27 DIAGNOSIS — K625 Hemorrhage of anus and rectum: Secondary | ICD-10-CM | POA: Insufficient documentation

## 2020-02-27 DIAGNOSIS — S31109S Unspecified open wound of abdominal wall, unspecified quadrant without penetration into peritoneal cavity, sequela: Secondary | ICD-10-CM | POA: Diagnosis not present

## 2020-02-27 DIAGNOSIS — D508 Other iron deficiency anemias: Secondary | ICD-10-CM

## 2020-02-27 MED ORDER — IOHEXOL 300 MG/ML  SOLN
100.0000 mL | Freq: Once | INTRAMUSCULAR | Status: AC | PRN
  Administered 2020-02-27: 100 mL via INTRAVENOUS

## 2020-02-27 NOTE — Telephone Encounter (Signed)
Patient is at her PCP office presently to be seen due to fevers. She also had an u/s done yesterday at [facility]. She is inquiring about her CT imaging done for rectal bleeding. No abscess seen. Please review.

## 2020-02-27 NOTE — Telephone Encounter (Signed)
Office note, imaging, demographics and insurance information faxed to [employer]. Marked the referral as URGENT.

## 2020-02-27 NOTE — Telephone Encounter (Signed)
Pt called to inform that she just had imaging done at [facility] hospital. She is requesting a call back as soon as her results are back. She stated that her sxs are worsening, she has been experiencing fever and uncontrollable diarrhea.

## 2020-02-27 NOTE — Telephone Encounter (Signed)
Suspect a left-sided perianal fistula, inserting to the skin on left aspect of intergluteal cleft. Fistula tract is approximately 3 cm in length.  2.  No evidence of abscess. 3.  Redemonstrated postoperative findings of prior rectosigmoid resection and side and reanastomosis. 4.  Low midline ventral abdominal wall defect with open wound and packing material.  She needs an urgent appt with [facility], Dr. Andrey Campanile or one his partners, for mgmt.

## 2020-02-27 NOTE — Telephone Encounter (Signed)
Left a message for the patient to call back.  

## 2020-02-27 NOTE — Telephone Encounter (Signed)
Patient notified

## 2020-02-27 NOTE — Progress Notes (Signed)
Subjective:    Patient ID: [name], female    DOB: [birth year], 45 y.o.   MRN: [id number]  HPI Patient has an open wound on her abdomen.  She has a complicated surgical history.  I have copied her surgical history and included below for reference: Surgical Hx: -Hx perforated diverticulum in the mid 2000's. Had a colostomy and then reversal.  -2009 incisional hernia repair -2015 gastric sleeve. Developed an abdominal seroma. Had abdominal drains.  -2016 drainage of the seroma cavity  -December 2020 large amount of fluid present -CT scan in April with small residual fluid collection -May 2021 debridement of the seroma cavity. Measurements of the area were 15 x 15 x 3. A wound VAC was placed postoperatively.   The wound VAC has been discontinued and the seroma cavity is being allowed to close to secondary intention.  Patient is performing twice daily wet-to-dry dressing changes.  She states that she is having intermittent fevers.  She denies any abdominal pain.  However she states that sometimes when she removes the packing she feels like there is pus adherent to the packing.  On examination today there is a ventral vertical surgical wound.  There is an opening adjacent to this that is roughly 3 cm in diameter.  There is packing protruding from this that I removed.  This reveals a cavity that is roughly 5 or 6 cm deep and tunnels superiorly and to the left side of the patient's abdomen.  There is no evidence of any surrounding erythema or cellulitis.  There is no draining pus.  She had a CT scan performed this morning that showed no intra-abdominal abscess although it does show some perianal fistulas Past Medical History:  Diagnosis Date  . ADHD   . Anal fistula   . Anxiety   . Arthritis    pt denies 5/3 visit   . Bipolar affective ([place])   . Blood transfusion without reported diagnosis   . Colostomy in place [facility])   . CVA (cerebrovascular accident) ([place])    left cerebellar infarct  found accidentally on CT (2020)   . Depression   . Diverticulitis 2008   perforated/ requiring resection  . Fatty liver   . Gallstones   . GERD (gastroesophageal reflux disease)    occ.  Marland Kitchen Headache    after a fall  . Herpes simplex   . History of colon polyps   . History of hiatal hernia    small  . History of kidney stones    cystoscopy  basket removal  . Hypertension   . Iron deficiency anemia   . Obese   . OCD (obsessive compulsive disorder)   . Panic attacks   . Pulmonary nodules   . Sarcoidosis   . Schizophrenia ([place])   . Shortness of breath dyspnea    pt denies on 5/3 visit   . Sleep apnea    mild, does not use c-pap machine  . Substance abuse ([place])    12 years ago-crack cocaine   Current Outpatient Medications on File Prior to Visit  Medication Sig Dispense Refill  . albuterol (ACCUNEB) 0.63 MG/3ML nebulizer solution Take 1 ampule by nebulization every 6 (six) hours as needed for wheezing.    Marland Kitchen albuterol (VENTOLIN HFA) 108 (90 Base) MCG/ACT inhaler INHALE 2 PUFFS INTO THE LUNGS EVERY 4 (FOUR) HOURS AS NEEDED FOR WHEEZING OR SHORTNESS OF BREATH. 18 each 1  . amphetamine-dextroamphetamine (ADDERALL) 15 MG tablet Take 15 mg by mouth 3 (  three) times daily.     . Ascorbic Acid (VITAMIN C PO) Take by mouth.    Marland Kitchen atenolol (TENORMIN) 25 MG tablet Take 25 mg by mouth 2 (two) times daily.     Marland Kitchen atorvastatin (LIPITOR) 40 MG tablet TAKE 1 TABLET BY MOUTH EVERY DAY 90 tablet 1  . benzonatate (TESSALON PERLES) 100 MG capsule Take 2 capsules (200 mg total) by mouth 3 (three) times daily as needed for cough. 30 capsule 1  . clopidogrel (PLAVIX) 75 MG tablet Take 75 mg by mouth daily.    . diazepam (VALIUM) 2 MG tablet Take 2 mg by mouth every 6 (six) hours as needed for anxiety.    . ferrous sulfate 325 (65 FE) MG tablet Take 325 mg by mouth daily with breakfast.    . gabapentin (NEURONTIN) 300 MG capsule Take 300 mg by mouth 3 (three) times daily.    . hydrocortisone (ANUSOL-HC)  2.5 % rectal cream Place 1 application rectally at bedtime. 30 g 1  . Multiple Vitamins-Minerals (MULTIVITAMIN WITH MINERALS) tablet Take 1 tablet by mouth daily.     Marland Kitchen NUVIGIL 250 MG tablet Take 250 mg by mouth daily.  5  . ondansetron (ZOFRAN ODT) 4 MG disintegrating tablet Take 1 tablet (4 mg total) by mouth every 8 (eight) hours as needed for nausea or vomiting. 20 tablet 0  . pantoprazole (PROTONIX) 40 MG tablet Take 1 tablet (40 mg total) by mouth daily. 30 tablet 11  . QUEtiapine (SEROQUEL) 300 MG tablet Take 600 mg by mouth at bedtime.     . topiramate (TOPAMAX) 100 MG tablet Take 100 mg by mouth at bedtime.     . traMADol (ULTRAM) 50 MG tablet Take 100 mg by mouth every 6 (six) hours as needed.    . valACYclovir (VALTREX) 500 MG tablet TAKE 1 TABLET (500 MG TOTAL) BY MOUTH DAILY AS NEEDED (FEVER BLISTER). 90 tablet 2   Current Facility-Administered Medications on File Prior to Visit  Medication Dose Route Frequency Provider Last Rate Last Admin  . cyanocobalamin ((VITAMIN B-12)) injection 1,000 mcg  1,000 mcg Intramuscular Q30 days Susy Frizzle, MD   1,000 mcg at 07/25/19 1041   No Known Allergies Social History   Socioeconomic History  . Marital status: Single    Spouse name: Not on file  . Number of children: 2  . Years of education: 55   . Highest education level: Not on file  Occupational History  . Occupation: Insurance claims handler: UNEMPLOYED  Tobacco Use  . Smoking status: Former Smoker    Packs/day: 0.50    Years: 14.00    Pack years: 7.00    Types: E-cigarettes    Quit date: 12/20/2013    Years since quitting: 6.1  . Smokeless tobacco: Never Used  . Tobacco comment: Vapor cigarettes.  Vaping Use  . Vaping Use: Every day  . Last attempt to quit: 02/24/2019  . Substances: Nicotine, Flavoring  . Devices: started 2015  Substance and Sexual Activity  . Alcohol use: No    Alcohol/week: 0.0 standard drinks  . Drug use: Never  . Sexual activity: Not  Currently    Birth control/protection: Abstinence  Other Topics Concern  . Not on file  Social History Narrative  . Not on file   Social Determinants of Health   Financial Resource Strain: Not on file  Food Insecurity: Not on file  Transportation Needs: Not on file  Physical Activity: Not on file  Stress: Not on file  Social Connections: Not on file  Intimate Partner Violence: Not on file     Review of Systems  All other systems reviewed and are negative.      Objective:   Physical Exam Vitals reviewed.  Constitutional:      Appearance: She is obese.  Cardiovascular:     Rate and Rhythm: Normal rate and regular rhythm.     Heart sounds: Normal heart sounds.  Pulmonary:     Effort: Pulmonary effort is normal.     Breath sounds: Normal breath sounds.  Abdominal:    Neurological:     Mental Status: She is alert.           Assessment & Plan:  Iron deficiency anemia secondary to inadequate dietary iron intake - Plan: Iron, CBC with Differential/Platelet, COMPLETE METABOLIC PANEL WITH GFR  Open wound of abdominal wall, sequela  I repacked the wound with NS soaked gauze.  No evidence of a secondary infection.  I will check a CBC to monitor her iron deficiency anemia.  CT scan showed no intra-abdominal abscess.  I see no role for antibiotics.  She has an appointment tomorrow to see her wound care specialist

## 2020-02-27 NOTE — Telephone Encounter (Signed)
Pt is requesting a call back from a nurse regarding her CT scan results. 

## 2020-02-28 ENCOUNTER — Ambulatory Visit (INDEPENDENT_AMBULATORY_CARE_PROVIDER_SITE_OTHER): Payer: Medicare Other | Admitting: Nurse Practitioner

## 2020-02-28 ENCOUNTER — Encounter: Payer: Self-pay | Admitting: Nurse Practitioner

## 2020-02-28 ENCOUNTER — Telehealth: Payer: Self-pay | Admitting: Family Medicine

## 2020-02-28 VITALS — BP 130/80 | HR 100 | Ht 66.0 in | Wt 236.4 lb

## 2020-02-28 DIAGNOSIS — D539 Nutritional anemia, unspecified: Secondary | ICD-10-CM | POA: Diagnosis not present

## 2020-02-28 DIAGNOSIS — E46 Unspecified protein-calorie malnutrition: Secondary | ICD-10-CM | POA: Diagnosis not present

## 2020-02-28 DIAGNOSIS — D869 Sarcoidosis, unspecified: Secondary | ICD-10-CM | POA: Diagnosis not present

## 2020-02-28 DIAGNOSIS — S31109D Unspecified open wound of abdominal wall, unspecified quadrant without penetration into peritoneal cavity, subsequent encounter: Secondary | ICD-10-CM | POA: Diagnosis not present

## 2020-02-28 DIAGNOSIS — S301XXD Contusion of abdominal wall, subsequent encounter: Secondary | ICD-10-CM | POA: Diagnosis not present

## 2020-02-28 DIAGNOSIS — K603 Anal fistula: Secondary | ICD-10-CM

## 2020-02-28 LAB — COMPLETE METABOLIC PANEL WITH GFR
AG Ratio: 1.7 (calc) (ref 1.0–2.5)
ALT: 21 U/L (ref 6–29)
AST: 17 U/L (ref 10–30)
Albumin: 4.4 g/dL (ref 3.6–5.1)
Alkaline phosphatase (APISO): 108 U/L (ref 31–125)
BUN: 9 mg/dL (ref 7–25)
CO2: 27 mmol/L (ref 20–32)
Calcium: 9.5 mg/dL (ref 8.6–10.2)
Chloride: 104 mmol/L (ref 98–110)
Creat: 0.67 mg/dL (ref 0.50–1.10)
GFR, Est African American: 125 mL/min/{1.73_m2} (ref >=60)
GFR, Est Non African American: 108 mL/min/{1.73_m2} (ref >=60)
Globulin: 2.6 g/dL (calc) (ref 1.9–3.7)
Glucose, Bld: 86 mg/dL (ref 65–99)
Potassium: 3.7 mmol/L (ref 3.5–5.3)
Sodium: 140 mmol/L (ref 135–146)
Total Bilirubin: 0.3 mg/dL (ref 0.2–1.2)
Total Protein: 7 g/dL (ref 6.1–8.1)

## 2020-02-28 LAB — CBC WITH DIFFERENTIAL/PLATELET
Absolute Monocytes: 475 cells/uL (ref 200–950)
Basophils Absolute: 10 cells/uL (ref 0–200)
Basophils Relative: 0.2 %
Eosinophils Absolute: 280 cells/uL (ref 15–500)
Eosinophils Relative: 5.6 %
HCT: 40.6 % (ref 35.0–45.0)
Hemoglobin: 13.6 g/dL (ref 11.7–15.5)
Lymphs Abs: 755 cells/uL — ABNORMAL LOW (ref 850–3900)
MCH: 29.2 pg (ref 27.0–33.0)
MCHC: 33.5 g/dL (ref 32.0–36.0)
MCV: 87.1 fL (ref 80.0–100.0)
MPV: 10.4 fL (ref 7.5–12.5)
Monocytes Relative: 9.5 %
Neutro Abs: 3480 cells/uL (ref 1500–7800)
Neutrophils Relative %: 69.6 %
Platelets: 348 10*3/uL (ref 140–400)
RBC: 4.66 10*6/uL (ref 3.80–5.10)
RDW: 13.4 % (ref 11.0–15.0)
Total Lymphocyte: 15.1 %
WBC: 5 10*3/uL (ref 3.8–10.8)

## 2020-02-28 LAB — IRON: Iron: 36 ug/dL — ABNORMAL LOW (ref 40–190)

## 2020-02-28 MED ORDER — METRONIDAZOLE 500 MG PO TABS: 500.0000 mg | ORAL_TABLET | Freq: Three times a day (TID) | ORAL | 0 refills | Status: DC

## 2020-02-28 NOTE — Progress Notes (Addendum)
Reviewed and agree with management plan.  Akeylah Hendel T. Annastyn Silvey, MD FACG [facility]  

## 2020-02-28 NOTE — Telephone Encounter (Signed)
Patient requesting lab results from yesterday. She is concerned that she has an infection requesting a different provider look at her labs since Dr. Tanya Nones is out today.  CB# [phone]

## 2020-02-28 NOTE — Progress Notes (Signed)
ASSESSMENT AND PLAN    # 45 yo female here for follow up on buttock pain, rectal bleeding, and abnormal rectal exam last month ( hemorrhoids, yellow exudate in anal canal ).  Due to ongoing symptoms we proceeded with pelvic CT scan yesterday which suggested presence of a 3 cm left sided perianal fistula inserting to the skin on the left aspect of the intergluteal cleft. She gives a remote history of an anal fistula s/p fistulotomy.  Etiology of recurrent fistula unclear. No evidence of Crohn's disease on colonoscopy 05/24/19 and her colo-colonic anastomosis (remote perforated diverticulitis) appeared healthy at the time.  --Patient has been referred to Dr. Andrey Campanile. She is known to him from previous abdominal wall seroma surgeries. --In the interim will give course of Flagyl 500 mg TID x 10 days.   # Chronic abdominal wall seroma. Wound Vac removed since I saw her early December. She saw Plastic Surgery at [facility] today. May potentially get a panniculectomy to excise wound.  # Hx of CVA on plavix.    HISTORY OF PRESENT ILLNESS     Primary Gastroenterologist :  Claudette Head, MD  Chief Complaint : rectal bleeding, persistent pain involving right buttock  [name] is a 45 y.o. female with a complex medical and surgical history not limited to  sarcoidosis, perforated diverticulitis in 2008, history of gastric sleeve, history of anal fistula s/p fistulotomy, abdominal wall hernia s/p repair, chronic subcutaneous abdominal wall seroma requiring repeated drainage and wound vac, serrated sessile polyps , external hemorrhoids, ADHD, bipolar affective disorder, schizophrenia, former smoker , CVA on plavix,  cholelithiasis. She is on multiple medications.   Patient seen here Feb 2021 for IDA and positive FIT.  She has a history of perforated diverticulitis in 2008 with colostomy, Hartmann's pouch, subsequent takedown.  She developed an incisional hernia with a subcutaneous abdominal wall  seroma requiring surgical repair in 2016.  She had a recurrence of the seroma requiring percutaneous drainage in January 2021. In May 2021 [facility] debrided wound and placed wound vac.  For evaluation of IDA and positive FIT patient underwent upper endoscopy and colonoscopy March 2021. Nothing found to explain the anemia.  Small bowel biopsies negative.  Patient is followed by Hematology for iron deficiency anemia.    Patient was last seen here on 01/25/20 for right buttock pain and rectal bleeding on plavix. Bleeding felt to be secondary to inflamed internal hemorhoids seen on anoscopy. However, there was thick, yellow exudate in anal canal on anoscopy. Her fiber was increased to once daily, hemorrhoids treated with topical steroids.    Interval History:   Patient has returned for follow up as requested.  [name] has continued to have rectal bleeding and buttock pain. We ordered CT scan of pelvis yesterday which suggested left sided perianal fistula inserting to the skin on the left aspect of the intergluteal cleft. Fistula tract approximately 3 cm in length. Fistula's anatomy couldn't be cleared discerned by CT scan. She describes perianal cysts being lanced / drained several years ago in [place]. She now believes that those were fistulas.    [facility] Pl[facility] Surgery today at [facility] regarding chronic abdominal wound. Still having a lot of drainage but says Surgeon removed wound vac. May possibly get panniculectomy to excise wound. Recommended weight loss to assist with surgical reconstruction and decrease chance of seroma. She is to see him back in 4 weeks. Surgeon also planned to refer her to general surgery for perianal fistula but  we are already in process of referring her to [name] with [facility].    Data Reviewed: 02/27/19 CBC normal   PREVIOUS GI EVALUATIONS:   March 2021 EGD and colonoscopy for evaluation of IDA -LA Grade A (one or more mucosal breaks less than 5 mm, not extending between tops  of 2 mucosal folds) esophagitis with no bleeding was found at the gastroesophageal junction. -The exam of the esophagus was otherwise normal. - Patchy mildly erythematous mucosa without bleeding was found in the entire examined stomach. Biopsies were taken with a cold forceps for histology. - Evidence of a vertical banded gastroplasty was found. A normal appaering gastric sleve was found. - The exam of the stomach was otherwise normal. - The duodenal bulb and second portion of the duodenum were normal. Biopsies for histology were taken with a cold forceps for evaluation of celiac disease  Surgical [P], colon, transverse and ascending, polyp (2) - SESSILE SERRATED POLYP(S) WITHOUT CYTOLOGIC DYSPLASIA. - NO EVIDENCE OF CARCINOMA. 2. Surgical [P], 2nd portion of duodenum and distal duodenum - DUODENAL MUCOSA WITH SLIGHT BRUNNER GLAND HYPERPLASIA. - NO FEATURES OF SPRUE OR GRANULOMAS. 3. Surgical [P], gastric erythema bx - ANTRAL AND OXYNTIC MUCOSA WITH MILD HYPEREMIA. - WARTHIN-STARRY NEGATIVE FOR HELICOBACTER PYLORI. - NO INTESTINAL METAPLASIA, DYSPLASIA, OR CARCINOMA.   -The perianal and digital rectal examinations were normal. - The terminal ileum appeared normal. - Two sessile polyps were found in the transverse colon and ascending colon. The polyps were 8 to 11 mm in size. These polyps were removed with a hot snare. Resection and retrieval were complete. - A few medium-mouthed diverticula were found in the right colon. There was no evidence of diverticular bleeding. - There was evidence of a prior end-to-side colo-colonic anastomosis in the recto-sigmoid colon. This was patent and was characterized by healthy appearing mucosa. The anastomosis was traversed. - Internal hemorrhoids were found during retroflexion. The hemorrhoids were small and Grade I (internal hemorrhoids that do not prolapse). - The exam was otherwise without abnormality on direct and retroflexion  views.   Past Medical History:  Diagnosis Date  . ADHD   . Anal fistula   . Anxiety   . Arthritis    pt denies 5/3 visit   . Bipolar affective ([place])   . Blood transfusion without reported diagnosis   . Colostomy in place [facility])   . CVA (cerebrovascular accident) ([place])    left cerebellar infarct found accidentally on CT (2020)   . Depression   . Diverticulitis 2008   perforated/ requiring resection  . Fatty liver   . Gallstones   . GERD (gastroesophageal reflux disease)    occ.  Marland Kitchen Headache    after a fall  . Herpes simplex   . History of colon polyps   . History of hiatal hernia    small  . History of kidney stones    cystoscopy  basket removal  . Hypertension   . Iron deficiency anemia   . Obese   . OCD (obsessive compulsive disorder)   . Panic attacks   . Pulmonary nodules   . Sarcoidosis   . Schizophrenia ([place])   . Shortness of breath dyspnea    pt denies on 5/3 visit   . Sleep apnea    mild, does not use c-pap machine  . Substance abuse ([place])    12 years ago-crack cocaine    Current Medications, Allergies, Past Surgical History, Family History and Social History were reviewed in [employer]  medical record.   Current Outpatient Medications  Medication Sig Dispense Refill  . albuterol (VENTOLIN HFA) 108 (90 Base) MCG/ACT inhaler INHALE 2 PUFFS INTO THE LUNGS EVERY 4 (FOUR) HOURS AS NEEDED FOR WHEEZING OR SHORTNESS OF BREATH. 18 each 1  . amphetamine-dextroamphetamine (ADDERALL) 15 MG tablet Take 15 mg by mouth 3 (three) times daily.     Marland Kitchen atenolol (TENORMIN) 25 MG tablet Take 25 mg by mouth 2 (two) times daily.     Marland Kitchen atorvastatin (LIPITOR) 40 MG tablet TAKE 1 TABLET BY MOUTH EVERY DAY 90 tablet 1  . clopidogrel (PLAVIX) 75 MG tablet Take 75 mg by mouth daily.    Marland Kitchen DIAZEPAM PO Take 4 mg by mouth daily.    . ferrous sulfate 325 (65 FE) MG tablet Take 325 mg by mouth daily with breakfast.    . gabapentin (NEURONTIN) 600 MG tablet Take 600 mg by  mouth 3 (three) times daily.    . hydrocortisone (ANUSOL-HC) 2.5 % rectal cream Place 1 application rectally at bedtime. 30 g 1  . Multiple Vitamins-Minerals (MULTIVITAMIN WITH MINERALS) tablet Take 1 tablet by mouth daily.     . ondansetron (ZOFRAN ODT) 4 MG disintegrating tablet Take 1 tablet (4 mg total) by mouth every 8 (eight) hours as needed for nausea or vomiting. 20 tablet 0  . pantoprazole (PROTONIX) 40 MG tablet Take 1 tablet (40 mg total) by mouth daily. 30 tablet 11  . promethazine (PHENERGAN) 25 MG tablet Take 25 mg by mouth as needed.    Marland Kitchen QUEtiapine (SEROQUEL) 300 MG tablet Take 600 mg by mouth at bedtime.     . topiramate (TOPAMAX) 100 MG tablet Take 100 mg by mouth at bedtime.     . traMADol (ULTRAM) 50 MG tablet Take 50 mg by mouth in the morning.    . valACYclovir (VALTREX) 500 MG tablet TAKE 1 TABLET (500 MG TOTAL) BY MOUTH DAILY AS NEEDED (FEVER BLISTER). 90 tablet 2  . NUVIGIL 250 MG tablet Take 250 mg by mouth daily. (Patient not taking: Reported on 02/28/2020)  5   Current Facility-Administered Medications  Medication Dose Route Frequency Provider Last Rate Last Admin  . cyanocobalamin ((VITAMIN B-12)) injection 1,000 mcg  1,000 mcg Intramuscular Q30 days Susy Frizzle, MD   1,000 mcg at 07/25/19 1041    Review of Systems: No chest pain. No shortness of breath. No urinary complaints.   PHYSICAL EXAM :    Wt Readings from Last 3 Encounters:  02/28/20 236 lb 6.4 oz (107.2 kg)  02/15/20 239 lb 11.2 oz (108.7 kg)  01/25/20 243 lb (110.2 kg)    BP 130/80   Pulse 100   Ht 5\' 6"  (1.676 m)   Wt 236 lb 6.4 oz (107.2 kg)   LMP 02/20/2020   SpO2 97%   BMI 38.16 kg/m  Constitutional:  Pleasant female in no acute distress. Psychiatric: Normal mood and affect. Behavior is normal. EENT: Pupils normal.  Conjunctivae are normal. No scleral icterus. Neck supple.  Cardiovascular: Normal rate, regular rhythm. No edema Pulmonary/chest: Effort normal and breath sounds  normal. No wheezing, rales or rhonchi. Abdominal: Soft, protuberant, large dry mid abdominal dressing in place.  Bowel sounds active throughout.  Neurological: Alert and oriented to person place and time. Skin: Skin is warm and dry. No rashes noted.  Tye Savoy, NP  02/28/2020, 3:27 PM

## 2020-02-28 NOTE — Telephone Encounter (Signed)
Call placed to patient.   Advised that Fe level is slightly low, but no other abnormalities noted. Advised that PCP will review on 02/29/2020.

## 2020-02-28 NOTE — Patient Instructions (Addendum)
If you are age 44 or older, your body mass index should be between 23-30. Your Body mass index is 38.16 kg/m. If this is out of the aforementioned range listed, please consider follow up with your Primary Care Provider.  If you are age 72 or younger, your body mass index should be between 19-25. Your Body mass index is 38.16 kg/m. If this is out of the aformentioned range listed, please consider follow up with your Primary Care Provider.   START Metronidazole 500 mg 1 tablet three times daily for 10 days.  A referral has been sent to Dr. Andrey Campanile at [facility]: They are located at: [address], [place], [place] [address] You can reach them at [phone]  Follow up as needed.  Thank you for entrusting me with your care and choosing [facility].  Willette Cluster, NP-C

## 2020-02-29 ENCOUNTER — Telehealth: Payer: Self-pay | Admitting: Surgery

## 2020-02-29 ENCOUNTER — Encounter (HOSPITAL_COMMUNITY): Payer: Self-pay | Admitting: Emergency Medicine

## 2020-02-29 ENCOUNTER — Emergency Department (HOSPITAL_COMMUNITY)
Admission: EM | Admit: 2020-02-29 | Discharge: 2020-02-29 | Disposition: A | Discharge status: Home / Self Care | Payer: Medicare Other | Attending: Emergency Medicine | Admitting: Emergency Medicine

## 2020-02-29 ENCOUNTER — Other Ambulatory Visit: Payer: Self-pay

## 2020-02-29 DIAGNOSIS — Z79899 Other long term (current) drug therapy: Secondary | ICD-10-CM | POA: Insufficient documentation

## 2020-02-29 DIAGNOSIS — K6289 Other specified diseases of anus and rectum: Secondary | ICD-10-CM | POA: Diagnosis not present

## 2020-02-29 DIAGNOSIS — Z87891 Personal history of nicotine dependence: Secondary | ICD-10-CM | POA: Diagnosis not present

## 2020-02-29 DIAGNOSIS — R1084 Generalized abdominal pain: Secondary | ICD-10-CM | POA: Diagnosis not present

## 2020-02-29 DIAGNOSIS — G8929 Other chronic pain: Secondary | ICD-10-CM | POA: Diagnosis not present

## 2020-02-29 DIAGNOSIS — R109 Unspecified abdominal pain: Secondary | ICD-10-CM | POA: Diagnosis not present

## 2020-02-29 DIAGNOSIS — Z86010 Personal history of colonic polyps: Secondary | ICD-10-CM | POA: Diagnosis not present

## 2020-02-29 MED ORDER — HYDROCODONE-ACETAMINOPHEN 5-325 MG PO TABS: 1.0000 | ORAL_TABLET | ORAL | 0 refills | Status: DC | PRN

## 2020-02-29 NOTE — Telephone Encounter (Signed)
[name]  [birth year] [id number]  Patient Care Team: Susy Frizzle, MD as PCP - General (Family Medicine) Greer Pickerel, MD as Consulting Physician (General Surgery)  This patient is a 45 y.o.female who calls today for surgical evaluation.    Reason for call: Anal pain  Patient followed by [place] undergone surgery.  History of gastric bypass by [name].  Has had intermittent anal issues.  She describes having to have a fistula repaired many years ago.  Currently for the past few months has had intermittent anal pain and swelling.  She notes that she has some anal masses in the outside as well.  Has had worsening pain or discomfort.  Saw [facility] yesterday.  CT scan done concerning for possible fistula.  Placed on Flagyl.  Recommended to follow-up with [facility].  She had taken a dose of Flagyl but she was not better and was concerned/disappointed.  Having a lot of sharp pain..  Patient called at 530 morning to relay her concerns and her history.  She does have a history of bipolar disorder.  With her complaints and concerns, I noted that her only option at this time was to go to the emergency room for more aggressive evaluation, but I suspect that she could wait a few hours and call the office.  She did not want to go to the emergency room as she "hates going to emergency rooms in [place]".  She did mention that she was trying to decide which surgeon to see, having seen doctors and surgeons in [place] as well.  I recommended that should she choose for [place] to be her surgeons (which I am sort of assuming since she called at 5:30 in the morning), she call our office when it is open at 9am to discuss concerns and see if she can be seen more urgently to rule out a thrombosed hemorrhoid or perirectal abscess.  If she truly has a fistula she may require surgical intervention.  Patient likes [name] & was hoping he can manage it.  He has tended to go away from  anorectal interventions but we will see.  Patient seemed annoyed when I mention these recommendations and concerns and differential diagnosis, so she hung up on me.  Patient Active Problem List   Diagnosis Date Noted  . Iron deficiency anemia due to chronic blood loss 11/09/2019  . Sarcoidosis 04/26/2019  . Abnormal CT of the chest 03/23/2019  . Dyspnea 03/23/2019  . Hypokalemia 02/24/2019  . Sepsis ([place]) 02/24/2019  . Cerebellar infarct ([place]) 11/16/2018  . CVA (cerebrovascular accident) ([place])   . Degeneration of lumbar or lumbosacral intervertebral disc 12/13/2014  . Anemia associated with nutritional deficiency 11/12/2014  . Bipolar affective disorder ([place]) 09/28/2014  . Anxiety state 09/28/2014  . S/P laparoscopic sleeve gastrectomy 01/2014 09/28/2014  . Abdominal wall seroma - chronic   . Seroma   . Cholelithiases 04/25/2013  . Cough 09/19/2011  . Smoker 09/19/2011  . GERD 03/04/2010  . RECTAL BLEEDING 03/04/2010  . DYSPHAGIA UNSPECIFIED 03/04/2010  . DYSPHAGIA 03/04/2010  . RUQ PAIN 03/04/2010    Past Medical History:  Diagnosis Date  . ADHD   . Anal fistula   . Anxiety   . Arthritis    pt denies 5/3 visit   . Bipolar affective ([place])   . Blood transfusion without reported diagnosis   . Colostomy in place [facility])   . CVA (cerebrovascular accident) ([place])    left cerebellar infarct found accidentally on  CT (2020)   . Depression   . Diverticulitis 2008   perforated/ requiring resection  . Fatty liver   . Gallstones   . GERD (gastroesophageal reflux disease)    occ.  Marland Kitchen Headache    after a fall  . Herpes simplex   . History of colon polyps   . History of hiatal hernia    small  . History of kidney stones    cystoscopy  basket removal  . Hypertension   . Iron deficiency anemia   . Obese   . OCD (obsessive compulsive disorder)   . Panic attacks   . Pulmonary nodules   . Sarcoidosis   . Schizophrenia ([place])   . Shortness of breath dyspnea    pt denies on 5/3  visit   . Sleep apnea    mild, does not use c-pap machine  . Substance abuse ([place])    12 years ago-crack cocaine    Past Surgical History:  Procedure Laterality Date  . Abdominal wall hernia  07/2007   open repair with lysis of adhesions  . APPLICATION OF WOUND VAC N/A 06/29/2019   Procedure: PLACEMENT OF WOUND VAC;  Surgeon: Greer Pickerel, MD;  Location: [facility]S;  Service: General;  Laterality: N/A;  . BRONCHIAL BRUSHINGS  04/11/2019   Procedure: BRONCHIAL BRUSHINGS;  Surgeon: Collene Gobble, MD;  Location: [facility] ENDOSCOPY;  Service: Pulmonary;;  . BRONCHIAL WASHINGS  04/11/2019   Procedure: BRONCHIAL WASHINGS;  Surgeon: Collene Gobble, MD;  Location: [facility] ENDOSCOPY;  Service: Pulmonary;;  . COLONOSCOPY  03/13/2010   [id number] hemorrhoids likely cause of hematochezia, otherwise normal  . COLONOSCOPY  05/24/2019  . COLONOSCOPY WITH ESOPHAGOGASTRODUODENOSCOPY (EGD)  04/2019  . Colostomy reversal  11/2006  . DEBRIDEMENT OF ABDOMINAL WALL ABSCESS N/A 06/29/2019   Procedure: EXCISIONAL DEBRIDEMENT OF ABDOMINAL WALL/SUBCU SEROMA;  Surgeon: Greer Pickerel, MD;  Location: [facility];  Service: General;  Laterality: N/A;  . ESOPHAGOGASTRODUODENOSCOPY  03/13/2010   [id number] esophagus, status post passage of a Maloney dilator/Small hiatal hernia/ Antral erosions, status post biopsy  . EXAMINATION UNDER ANESTHESIA N/A 05/11/2012   Procedure: EXAM UNDER ANESTHESIA;  Surgeon: Donato Heinz, MD;  Location: [facility];  Service: General;  Laterality: N/A;  . Exploratory laparotomy with resection  2008   colonoscopy  . FINE NEEDLE ASPIRATION  04/11/2019   Procedure: FINE NEEDLE ASPIRATION (FNA) LINEAR;  Surgeon: Collene Gobble, MD;  Location: [facility] ENDOSCOPY;  Service: Pulmonary;;  . FINGER CLOSED REDUCTION Right 08/05/2012   Procedure: CLOSED REDUCTION RIGHT THUMB (FINGER);  Surgeon: Linna Hoff, MD;  Location: [facility];  Service: Orthopedics;  Laterality: Right;  . HERNIA REPAIR     incisional hernia  surgery   . Hx of abd wall seroma  08/2007   Drained via [place] in [place], [place]  . Hx of abd wall seroma  10/2007   Drained by Dr. Geroge Baseman in office  . INCISION AND DRAINAGE ABSCESS N/A 06/29/2019   Procedure: INCISION AND DRAINAGE;  Surgeon: Greer Pickerel, MD;  Location: [facility];  Service: General;  Laterality: N/A;  . IR RADIOLOGIST EVAL & MGMT  03/15/2019  . IR RADIOLOGIST EVAL & MGMT  04/06/2019  . Kidney stones    . LAPAROSCOPIC GASTRIC SLEEVE RESECTION N/A 02/13/2014   Procedure: LAPAROSCOPIC GASTRIC SLEEVE RESECTION LYSIS OF ADHESIONS, UPPER ENDOSCOPY;  Surgeon: [name], MD;  Location[facility];  Service: General;  Laterality: N/A;  . LAPAROSCOPY N/A 09/28/2014   Procedure: LAPAROSCOPY DIAGNOSTIC, INCISION AND  DRAINAGE WITH LAPAROSCOPIC EXPLORATION OF ABDOMINAL WALL SEROMA with ultrasound;  Surgeon: Gaynelle Adu, MD;  Location: [facility];  Service: General;  Laterality: N/A;  . LUNG BIOPSY  04/11/2019   Procedure: LUNG BIOPSY;  Surgeon: Leslye Peer, MD;  Location: [facility] ENDOSCOPY;  Service: Pulmonary;;  distal trachea  . PLACEMENT OF SETON N/A 05/11/2012   Procedure: PLACEMENT OF SETON;  Surgeon: Fabio Bering, MD;  Location: [facility];  Service: General;  Laterality: N/A;  . root canal 7-16    . TONSILLECTOMY    . TREATMENT FISTULA ANAL     x 2  . UPPER GASTROINTESTINAL ENDOSCOPY  05/24/2019  . VIDEO BRONCHOSCOPY WITH ENDOBRONCHIAL ULTRASOUND N/A 04/11/2019   Procedure: VIDEO BRONCHOSCOPY WITH ENDOBRONCHIAL ULTRASOUND;  Surgeon: Leslye Peer, MD;  Location: [facility] ENDOSCOPY;  Service: Pulmonary;  Laterality: N/A;    Social History   Socioeconomic History  . Marital status: Single    Spouse name: Not on file  . Number of children: 2  . Years of education: 78   . Highest education level: Not on file  Occupational History  . 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 Smoking status: Former Smoker    Packs/day: 0.50    Years: 14.00    Pack years: 7.00     Types: E-cigarettes    Quit date: 12/20/2013    Years since quitting: 6.1  . Smokeless tobacco: Never Used  . Tobacco comment: Vapor cigarettes.  Vaping Use  . Vaping Use: Former  . Quit date: 02/24/2019  . Substances: Nicotine, Flavoring  . Devices: started 2015  Substance and Sexual Activity  . Alcohol use: No    Alcohol/week: 0.0 standard drinks  . Drug use: Never  . Sexual activity: Not Currently    Birth control/protection: Abstinence  Other Topics Concern  . Not on file  Social History Narrative  . Not on file   Social Determinants of Health   Financial Resource Strain: Not on file  Food Insecurity: Not on file  Transportation Needs: Not on file  Physical Activity: Not on file  Stress: Not on file  Social Connections: Not on file  Intimate Partner Violence: Not on file    Family History  Problem Relation Age of Onset  . Asthma Maternal Grandmother   . Colon polyps Maternal Grandmother   . Colon cancer Maternal Grandmother   . Esophageal cancer Neg Hx   . Rectal cancer Neg Hx   . Stomach cancer Neg Hx   . Pancreatic cancer Neg Hx   . Liver disease Neg Hx     Current Outpatient Medications  Medication Sig Dispense Refill  . albuterol (VENTOLIN HFA) 108 (90 Base) MCG/ACT inhaler INHALE 2 PUFFS INTO THE LUNGS EVERY 4 (FOUR) HOURS AS NEEDED FOR WHEEZING OR SHORTNESS OF BREATH. 18 each 1  . amphetamine-dextroamphetamine (ADDERALL) 15 MG tablet Take 15 mg by mouth 3 (three) times daily.     Marland Kitchen atenolol (TENORMIN) 25 MG tablet Take 25 mg by mouth 2 (two) times daily.     Marland Kitchen atorvastatin (LIPITOR) 40 MG tablet TAKE 1 TABLET BY MOUTH EVERY DAY 90 tablet 1  . clopidogrel (PLAVIX) 75 MG tablet Take 75 mg by mouth daily.    Marland Kitchen DIAZEPAM PO Take 4 mg by mouth daily.    . ferrous sulfate 325 (65 FE) MG tablet Take 325 mg by mouth daily with breakfast.    . gabapentin (NEURONTIN) 600 MG tablet Take 600 mg  by mouth 3 (three) times daily.    Marland Kitchen HYDROcodone-acetaminophen  (NORCO/VICODIN) 5-325 MG tablet Take 1 tablet by mouth every 4 (four) hours as needed for moderate pain. 20 tablet 0  . hydrocortisone (ANUSOL-HC) 2.5 % rectal cream Place 1 application rectally at bedtime. 30 g 1  . metroNIDAZOLE (FLAGYL) 500 MG tablet Take 1 tablet (500 mg total) by mouth 3 (three) times daily for 10 days. 30 tablet 0  . Multiple Vitamins-Minerals (MULTIVITAMIN WITH MINERALS) tablet Take 1 tablet by mouth daily.     Marland Kitchen NUVIGIL 250 MG tablet Take 250 mg by mouth daily. (Patient not taking: Reported on 02/28/2020)  5  . ondansetron (ZOFRAN ODT) 4 MG disintegrating tablet Take 1 tablet (4 mg total) by mouth every 8 (eight) hours as needed for nausea or vomiting. 20 tablet 0  . pantoprazole (PROTONIX) 40 MG tablet Take 1 tablet (40 mg total) by mouth daily. 30 tablet 11  . promethazine (PHENERGAN) 25 MG tablet Take 25 mg by mouth as needed.    Marland Kitchen QUEtiapine (SEROQUEL) 300 MG tablet Take 600 mg by mouth at bedtime.     . topiramate (TOPAMAX) 100 MG tablet Take 100 mg by mouth at bedtime.     . traMADol (ULTRAM) 50 MG tablet Take 50 mg by mouth in the morning.    . valACYclovir (VALTREX) 500 MG tablet TAKE 1 TABLET (500 MG TOTAL) BY MOUTH DAILY AS NEEDED (FEVER BLISTER). 90 tablet 2   Current Facility-Administered Medications  Medication Dose Route Frequency Provider Last Rate Last Admin  . cyanocobalamin ((VITAMIN B-12)) injection 1,000 mcg  1,000 mcg Intramuscular Q30 days Susy Frizzle, MD   1,000 mcg at 07/25/19 1041     No Known Allergies  @VS @  DG Lumbar Spine Complete  Result Date: 02/13/2020 CLINICAL DATA:  Low back pain EXAM: LUMBAR SPINE - COMPLETE 4+ VIEW COMPARISON:  11/20/2014 FINDINGS: There is no acute compression fracture. Mild disc height loss is noted at the L4-L5 and L3-L4 levels. There is moderate disc height loss at the L5-S1 level. There is facet arthrosis in the lower lumbar segments. There is no significant malalignment. Multiple phleboliths project over  the patient's pelvis. IMPRESSION: 1. No acute osseous abnormality. 2. Degenerative changes of the lumbar spine as above. Electronically Signed   By: Constance Holster M.D.   On: 02/13/2020 20:56   CT PELVIS W CONTRAST  Result Date: 02/27/2020 CLINICAL DATA:  Anorectal abscess, pelvic pain, rectal bleeding EXAM: CT PELVIS WITH CONTRAST TECHNIQUE: Multidetector CT imaging of the pelvis was performed using the standard protocol following the bolus administration of intravenous contrast. CONTRAST:  168mL OMNIPAQUE IOHEXOL 300 MG/ML SOLN, additional oral enteric contrast COMPARISON:  06/23/2019 FINDINGS: Urinary Tract:  No abnormality visualized. Bowel: Redemonstrated postoperative findings of prior rectosigmoid resection and side to end reanastomosis. Suspect a left-sided perianal fistula, inserting to the skin on the left aspect of the intergluteal cleft at approximately 4 o'clock face in lithotomy position (series 2, image 62). Fistula tract is approximately 3 cm in length (series 3, image 134). Vascular/Lymphatic: No pathologically enlarged lymph nodes. No significant vascular abnormality seen. Reproductive:  No mass or other significant abnormality Other: There is a low midline ventral abdominal wall defect with open wound and packing material (series 2, image 23). Musculoskeletal: No suspicious bone lesions identified. IMPRESSION: 1. Suspect a left-sided perianal fistula, inserting to the skin on the left aspect of the intergluteal cleft at approximately 4 o'clock face in lithotomy position. Fistula tract is  approximately 3 cm in length. Fistula anatomy with respect to the anal sphincters and levator musculature cannot be clearly discerned by CT; MRI is the test of choice for this anatomic delineation. 2. No evidence of abscess. 3. Redemonstrated postoperative findings of prior rectosigmoid resection and side and reanastomosis. 4. Low midline ventral abdominal wall defect with open wound and packing material.  Electronically Signed   By: Eddie Candle M.D.   On: 02/27/2020 13:07    Note: This dictation was prepared with Dragon/digital dictation along with [employer]. Any transcriptional errors that result from this process are unintentional.   .Adin Hector, M.D., F.A.C.S. Gastrointestinal and Minimally Invasive Surgery [facility] [address] [place], [place] [address] [phone] Main / Paging  02/29/2020 11:53 AM

## 2020-02-29 NOTE — ED Provider Notes (Signed)
[facility] EMERGENCY DEPARTMENT Provider Note   CSN: [id number] Arrival date & time: 02/29/20  [id number]     History Chief Complaint  Patient presents with  . Rectal Pain    [name] is a 45 y.o. female.  Pt request pain medications.  Pt reports Dr. Feliz Beam manages her pain.  Pt reports she has a rectal fistula that is causing pain.  Pt called [name] and was told to come to ED. Pt states she can not fill her RX until Sunday.    The history is provided by the patient. No language interpreter was used.  Rectal Bleeding Chronicity:  New Similar prior episodes: yes   Relieved by:  Nothing Worsened by:  Nothing      Past Medical History:  Diagnosis Date  . ADHD   . Anal fistula   . Anxiety   . Arthritis    pt denies 5/3 visit   . Bipolar affective ([place])   . Blood transfusion without reported diagnosis   . Colostomy in place [facility])   . CVA (cerebrovascular accident) ([place])    left cerebellar infarct found accidentally on CT (2020)   . Depression   . Diverticulitis 2008   perforated/ requiring resection  . Fatty liver   . Gallstones   . GERD (gastroesophageal reflux disease)    occ.  Marland Kitchen Headache    after a fall  . Herpes simplex   . History of colon polyps   . History of hiatal hernia    small  . History of kidney stones    cystoscopy  basket removal  . Hypertension   . Iron deficiency anemia   . Obese   . OCD (obsessive compulsive disorder)   . Panic attacks   . Pulmonary nodules   . Sarcoidosis   . Schizophrenia ([place])   . Shortness of breath dyspnea    pt denies on 5/3 visit   . Sleep apnea    mild, does not use c-pap machine  . Substance abuse ([place])    12 years ago-crack cocaine    Patient Active Problem List   Diagnosis Date Noted  . Iron deficiency anemia due to chronic blood loss 11/09/2019  . Sarcoidosis 04/26/2019  . Abnormal CT of the chest 03/23/2019  . Dyspnea 03/23/2019  . Hypokalemia 02/24/2019  . Sepsis ([place]) 02/24/2019  .  Cerebellar infarct ([place]) 11/16/2018  . CVA (cerebrovascular accident) ([place])   . Degeneration of lumbar or lumbosacral intervertebral disc 12/13/2014  . Anemia associated with nutritional deficiency 11/12/2014  . Bipolar affective disorder ([place]) 09/28/2014  . Anxiety state 09/28/2014  . S/P laparoscopic sleeve gastrectomy 01/2014 09/28/2014  . Abdominal wall seroma - chronic   . Seroma   . Cholelithiases 04/25/2013  . Cough 09/19/2011  . Smoker 09/19/2011  . GERD 03/04/2010  . RECTAL BLEEDING 03/04/2010  . DYSPHAGIA UNSPECIFIED 03/04/2010  . DYSPHAGIA 03/04/2010  . RUQ PAIN 03/04/2010    Past Surgical History:  Procedure Laterality Date  . Abdominal wall hernia  07/2007   open repair with lysis of adhesions  . APPLICATION OF WOUND VAC N/A 06/29/2019   Procedure: PLACEMENT OF WOUND VAC;  Surgeon: Greer Pickerel, MD;  Location: [facility] ORS;  Service: General;  Laterality: N/A;  . BRONCHIAL BRUSHINGS  04/11/2019   Procedure: BRONCHIAL BRUSHINGS;  Surgeon: Collene Gobble, MD;  Location: [facility] ENDOSCOPY;  Service: Pulmonary;;  . BRONCHIAL WASHINGS  04/11/2019   Procedure: BRONCHIAL WASHINGS;  Surgeon: Collene Gobble, MD;  Location:  [facility] ENDOSCOPY;  Service: Pulmonary;;  . COLONOSCOPY  03/13/2010   ZJQ:[id number] hemorrhoids likely cause of hematochezia, otherwise normal  . COLONOSCOPY  05/24/2019  . COLONOSCOPY WITH ESOPHAGOGASTRODUODENOSCOPY (EGD)  04/2019  . Colostomy reversal  11/2006  . DEBRIDEMENT OF ABDOMINAL WALL ABSCESS N/A 06/29/2019   Procedure: EXCISIONAL DEBRIDEMENT OF ABDOMINAL WALL/SUBCU SEROMA;  Surgeon: Gaynelle Adu, MD;  Location: [facility] ORS;  Service: General;  Laterality: N/A;  . ESOPHAGOGASTRODUODENOSCOPY  03/13/2010   KWI:[id number] esophagus, status post passage of a Maloney dilator/Small hiatal hernia/ Antral erosions, status post biopsy  . EXAMINATION UNDER ANESTHESIA N/A 05/11/2012   Procedure: EXAM UNDER ANESTHESIA;  Surgeon: Fabio Bering, MD;  Location: [facility] ORS;  Service:  General;  Laterality: N/A;  . Exploratory laparotomy with resection  2008   colonoscopy  . FINE NEEDLE ASPIRATION  04/11/2019   Procedure: FINE NEEDLE ASPIRATION (FNA) LINEAR;  Surgeon: Leslye Peer, MD;  Location: [facility] ENDOSCOPY;  Service: Pulmonary;;  . FINGER CLOSED REDUCTION Right 08/05/2012   Procedure: CLOSED REDUCTION RIGHT THUMB (FINGER);  Surgeon: Sharma Covert, MD;  Location: [facility] OR;  Service: Orthopedics;  Laterality: Right;  . HERNIA REPAIR     incisional hernia surgery   . Hx of abd wall seroma  08/2007   Drained via [place] in [place], [place]  . Hx of abd wall seroma  10/2007   Drained by Dr. Leticia Penna in office  . INCISION AND DRAINAGE ABSCESS N/A 06/29/2019   Procedure: INCISION AND DRAINAGE;  Surgeon: Gaynelle Adu, MD;  Location: [facility] ORS;  Service: General;  Laterality: N/A;  . IR RADIOLOGIST EVAL & MGMT  03/15/2019  . IR RADIOLOGIST EVAL & MGMT  04/06/2019  . Kidney stones    . LAPAROSCOPIC GASTRIC SLEEVE RESECTION N/A 02/13/2014   Procedure: LAPAROSCOPIC GASTRIC SLEEVE RESECTION LYSIS OF ADHESIONS, UPPER ENDOSCOPY;  Surgeon: Atilano Ina, MD;  Location: [facility] ORS;  Service: General;  Laterality: N/A;  . LAPAROSCOPY N/A 09/28/2014   Procedure: LAPAROSCOPY DIAGNOSTIC, INCISION AND DRAINAGE WITH LAPAROSCOPIC EXPLORATION OF ABDOMINAL WALL SEROMA with ultrasound;  Surgeon: Gaynelle Adu, MD;  Location: [facility] ORS;  Service: General;  Laterality: N/A;  . LUNG BIOPSY  04/11/2019   Procedure: LUNG BIOPSY;  Surgeon: Leslye Peer, MD;  Location: [facility] ENDOSCOPY;  Service: Pulmonary;;  distal trachea  . PLACEMENT OF SETON N/A 05/11/2012   Procedure: PLACEMENT OF SETON;  Surgeon: Fabio Bering, MD;  Location: [facility] ORS;  Service: General;  Laterality: N/A;  . root canal 7-16    . TONSILLECTOMY    . TREATMENT FISTULA ANAL     x 2  . UPPER GASTROINTESTINAL ENDOSCOPY  05/24/2019  . VIDEO BRONCHOSCOPY WITH ENDOBRONCHIAL ULTRASOUND N/A 04/11/2019   Procedure: VIDEO BRONCHOSCOPY WITH ENDOBRONCHIAL ULTRASOUND;   Surgeon: Leslye Peer, MD;  Location: [facility] ENDOSCOPY;  Service: Pulmonary;  Laterality: N/A;     OB History   No obstetric history on file.     Family History  Problem Relation Age of Onset  . Asthma Maternal Grandmother   . Colon polyps Maternal Grandmother   . Colon cancer Maternal Grandmother   . Esophageal cancer Neg Hx   . Rectal cancer Neg Hx   . Stomach cancer Neg Hx   . Pancreatic cancer Neg Hx   . Liver disease Neg Hx     Social History   Tobacco Use  . Smoking status: Former Smoker    Packs/day: 0.50    Years: 14.00    Pack years: 7.00  Types: E-cigarettes    Quit date: 12/20/2013    Years since quitting: 6.1  . Smokeless tobacco: Never Used  . Tobacco comment: Vapor cigarettes.  Vaping Use  . Vaping Use: Former  . Quit date: 02/24/2019  . Substances: Nicotine, Flavoring  . Devices: started 2015  Substance Use Topics  . Alcohol use: No    Alcohol/week: 0.0 standard drinks  . Drug use: Never    Home Medications Prior to Admission medications   Medication Sig Start Date End Date Taking? Authorizing Provider  HYDROcodone-acetaminophen (NORCO/VICODIN) 5-325 MG tablet Take 1 tablet by mouth every 4 (four) hours as needed for moderate pain. 02/29/20 02/28/21 Yes Cheron SchaumannSofia, Iysha Mishkin K, PA-C  albuterol (VENTOLIN HFA) 108 (90 Base) MCG/ACT inhaler INHALE 2 PUFFS INTO THE LUNGS EVERY 4 (FOUR) HOURS AS NEEDED FOR WHEEZING OR SHORTNESS OF BREATH. 01/02/20   Donita BrooksPickard, Warren T, MD  amphetamine-dextroamphetamine (ADDERALL) 15 MG tablet Take 15 mg by mouth 3 (three) times daily.  04/06/19   [provider]  atenolol (TENORMIN) 25 MG tablet Take 25 mg by mouth 2 (two) times daily.  03/21/19   [provider]  atorvastatin (LIPITOR) 40 MG tablet TAKE 1 TABLET BY MOUTH EVERY DAY 09/18/19   Donita BrooksPickard, Warren T, MD  clopidogrel (PLAVIX) 75 MG tablet Take 75 mg by mouth daily.    [provider]  DIAZEPAM PO Take 4 mg by mouth daily.    [provider]   ferrous sulfate 325 (65 FE) MG tablet Take 325 mg by mouth daily with breakfast.    [provider]  gabapentin (NEURONTIN) 600 MG tablet Take 600 mg by mouth 3 (three) times daily.    [provider]  hydrocortisone (ANUSOL-HC) 2.5 % rectal cream Place 1 application rectally at bedtime. 01/25/20   Meredith PelGuenther, Paula M, NP  metroNIDAZOLE (FLAGYL) 500 MG tablet Take 1 tablet (500 mg total) by mouth 3 (three) times daily for 10 days. 02/28/20 03/09/20  Meredith PelGuenther, Paula M, NP  Multiple Vitamins-Minerals (MULTIVITAMIN WITH MINERALS) tablet Take 1 tablet by mouth daily.     [provider]  NUVIGIL 250 MG tablet Take 250 mg by mouth daily. Patient not taking: Reported on 02/28/2020 11/14/14   [provider]  ondansetron (ZOFRAN ODT) 4 MG disintegrating tablet Take 1 tablet (4 mg total) by mouth every 8 (eight) hours as needed for nausea or vomiting. 02/06/19   Donita BrooksPickard, Warren T, MD  pantoprazole (PROTONIX) 40 MG tablet Take 1 tablet (40 mg total) by mouth daily. 05/24/19   Meryl DareStark, Malcolm T, MD  promethazine (PHENERGAN) 25 MG tablet Take 25 mg by mouth as needed.    [provider]  QUEtiapine (SEROQUEL) 300 MG tablet Take 600 mg by mouth at bedtime.  11/21/14   [provider]  topiramate (TOPAMAX) 100 MG tablet Take 100 mg by mouth at bedtime.  12/31/18   [provider]  traMADol (ULTRAM) 50 MG tablet Take 50 mg by mouth in the morning.    [provider]  valACYclovir (VALTREX) 500 MG tablet TAKE 1 TABLET (500 MG TOTAL) BY MOUTH DAILY AS NEEDED (FEVER BLISTER). 09/14/19   Donita BrooksPickard, Warren T, MD    Allergies    Patient has no known allergies.  Review of Systems   Review of Systems  Gastrointestinal: Positive for hematochezia.  All other systems reviewed and are negative.   Physical Exam Updated Vital Signs BP (!) 146/101 (BP Location: Right Arm)   Pulse 87   Temp 98.2  F (36.8 C) (Oral)   Resp 18   Ht 5\' 6"  (1.676 m)   Wt 107 kg    LMP 02/20/2020   SpO2 96%   BMI 38.09 kg/m   Physical Exam Vitals reviewed.  Constitutional:      Appearance: Normal appearance.  Cardiovascular:     Rate and Rhythm: Normal rate.  Pulmonary:     Effort: Pulmonary effort is normal.  Abdominal:     Comments: Large abdominal bandage  Skin:    General: Skin is warm.  Neurological:     General: No focal deficit present.     Mental Status: She is alert.  Psychiatric:        Mood and Affect: Mood normal.     ED Results / Procedures / Treatments   Labs (all labs ordered are listed, but only abnormal results are displayed) Labs Reviewed  CBC WITH DIFFERENTIAL/PLATELET  BASIC METABOLIC PANEL    EKG None  Radiology CT PELVIS W CONTRAST  Result Date: 02/27/2020 CLINICAL DATA:  Anorectal abscess, pelvic pain, rectal bleeding EXAM: CT PELVIS WITH CONTRAST TECHNIQUE: Multidetector CT imaging of the pelvis was performed using the standard protocol following the bolus administration of intravenous contrast. CONTRAST:  04/26/2020 OMNIPAQUE IOHEXOL 300 MG/ML SOLN, additional oral enteric contrast COMPARISON:  06/23/2019 FINDINGS: Urinary Tract:  No abnormality visualized. Bowel: Redemonstrated postoperative findings of prior rectosigmoid resection and side to end reanastomosis. Suspect a left-sided perianal fistula, inserting to the skin on the left aspect of the intergluteal cleft at approximately 4 o'clock face in lithotomy position (series 2, image 62). Fistula tract is approximately 3 cm in length (series 3, image 134). Vascular/Lymphatic: No pathologically enlarged lymph nodes. No significant vascular abnormality seen. Reproductive:  No mass or other significant abnormality Other: There is a low midline ventral abdominal wall defect with open wound and packing material (series 2, image 23). Musculoskeletal: No suspicious bone lesions identified. IMPRESSION: 1. Suspect a left-sided perianal fistula, inserting to the skin on the left aspect of  the intergluteal cleft at approximately 4 o'clock face in lithotomy position. Fistula tract is approximately 3 cm in length. Fistula anatomy with respect to the anal sphincters and levator musculature cannot be clearly discerned by CT; MRI is the test of choice for this anatomic delineation. 2. No evidence of abscess. 3. Redemonstrated postoperative findings of prior rectosigmoid resection and side and reanastomosis. 4. Low midline ventral abdominal wall defect with open wound and packing material. Electronically Signed   By: 06/25/2019 M.D.   On: 02/27/2020 13:07    Procedures Procedures (including critical care time)  Medications Ordered in ED Medications - No data to display  ED Course  I have reviewed the triage vital signs and the nursing notes.  Pertinent labs & imaging results that were available during my care of the patient were reviewed by me and considered in my medical decision making (see chart for details).    MDM Rules/Calculators/A&P                          MDM:  I agree to give pt 20 hydrocodone.  I advised pt she needs to discuss pain medication with Dr. 04/26/2020.   Final Clinical Impression(s) / ED Diagnoses Final diagnoses:  Rectal pain  Chronic abdominal pain    Rx / DC Orders ED Discharge Orders         Ordered    HYDROcodone-acetaminophen (NORCO/VICODIN) 5-325 MG tablet  Every 4  hours PRN        02/29/20 1052           Fransico Meadow, [place] 02/29/20 1101    Wyvonnia Dusky, MD 02/29/20 1221

## 2020-02-29 NOTE — Discharge Instructions (Addendum)
Discuss pain medication with your pain management physician.

## 2020-02-29 NOTE — ED Triage Notes (Signed)
Pt reports perianal fissure that is due to be surgically repaired on Tuesday. Pt reports intermittent pain since thanksgiving. Pt reports called surgeon and was told to come here for pain management. Pt reports also has a wound that wont heal on abdomen that home health has been managing and reports wound vac was removed yesterday. Pt reports intermittent chills and rectal bleeding.

## 2020-03-01 ENCOUNTER — Telehealth: Payer: Self-pay | Admitting: Nurse Practitioner

## 2020-03-01 NOTE — Telephone Encounter (Signed)
Patty, she can just stopped the flagyl. Thanks

## 2020-03-01 NOTE — Telephone Encounter (Signed)
The pt has a perianal fistula that she was given flagyl for on 1/5.  She states that it has helped but she gets a burning in her throat and a severe headache when she takes it.  I have advised her to stop the flagyl and I will pas this on to [facility] for further instructions.  She has an appt at [facility] on Tuesday.  She says it is very painful to walk.

## 2020-03-01 NOTE — Telephone Encounter (Signed)
Patient calling to advise that she is getting really sick from the Flagyl medication and she is seeking an alternative

## 2020-03-03 DIAGNOSIS — Z20822 Contact with and (suspected) exposure to covid-19: Secondary | ICD-10-CM | POA: Diagnosis not present

## 2020-03-03 DIAGNOSIS — I10 Essential (primary) hypertension: Secondary | ICD-10-CM | POA: Diagnosis not present

## 2020-03-03 DIAGNOSIS — K922 Gastrointestinal hemorrhage, unspecified: Secondary | ICD-10-CM | POA: Diagnosis not present

## 2020-03-03 DIAGNOSIS — R55 Syncope and collapse: Secondary | ICD-10-CM | POA: Diagnosis not present

## 2020-03-03 DIAGNOSIS — F17200 Nicotine dependence, unspecified, uncomplicated: Secondary | ICD-10-CM | POA: Diagnosis not present

## 2020-03-03 DIAGNOSIS — J45909 Unspecified asthma, uncomplicated: Secondary | ICD-10-CM | POA: Diagnosis not present

## 2020-03-03 DIAGNOSIS — R000 Tachycardia, unspecified: Secondary | ICD-10-CM | POA: Diagnosis not present

## 2020-03-03 DIAGNOSIS — Z7902 Long term (current) use of antithrombotics/antiplatelets: Secondary | ICD-10-CM | POA: Diagnosis not present

## 2020-03-03 DIAGNOSIS — G8929 Other chronic pain: Secondary | ICD-10-CM | POA: Diagnosis not present

## 2020-03-03 DIAGNOSIS — J9811 Atelectasis: Secondary | ICD-10-CM | POA: Diagnosis not present

## 2020-03-03 DIAGNOSIS — Z7982 Long term (current) use of aspirin: Secondary | ICD-10-CM | POA: Diagnosis not present

## 2020-03-03 DIAGNOSIS — F29 Unspecified psychosis not due to a substance or known physiological condition: Secondary | ICD-10-CM | POA: Diagnosis not present

## 2020-03-03 DIAGNOSIS — R519 Headache, unspecified: Secondary | ICD-10-CM | POA: Diagnosis not present

## 2020-03-03 DIAGNOSIS — F319 Bipolar disorder, unspecified: Secondary | ICD-10-CM | POA: Diagnosis not present

## 2020-03-03 DIAGNOSIS — F419 Anxiety disorder, unspecified: Secondary | ICD-10-CM | POA: Diagnosis not present

## 2020-03-03 DIAGNOSIS — R590 Localized enlarged lymph nodes: Secondary | ICD-10-CM | POA: Diagnosis not present

## 2020-03-03 DIAGNOSIS — Z8673 Personal history of transient ischemic attack (TIA), and cerebral infarction without residual deficits: Secondary | ICD-10-CM | POA: Diagnosis not present

## 2020-03-04 ENCOUNTER — Encounter (HOSPITAL_COMMUNITY): Payer: Self-pay | Admitting: Emergency Medicine

## 2020-03-04 ENCOUNTER — Emergency Department (HOSPITAL_COMMUNITY)
Admission: EM | Admit: 2020-03-04 | Discharge: 2020-03-05 | Disposition: A | Discharge status: Home / Self Care | Payer: Medicare Other | Attending: Emergency Medicine | Admitting: Emergency Medicine

## 2020-03-04 ENCOUNTER — Other Ambulatory Visit: Payer: Self-pay

## 2020-03-04 DIAGNOSIS — Z20822 Contact with and (suspected) exposure to covid-19: Secondary | ICD-10-CM | POA: Insufficient documentation

## 2020-03-04 DIAGNOSIS — Z046 Encounter for general psychiatric examination, requested by authority: Secondary | ICD-10-CM | POA: Insufficient documentation

## 2020-03-04 DIAGNOSIS — F419 Anxiety disorder, unspecified: Secondary | ICD-10-CM | POA: Diagnosis not present

## 2020-03-04 DIAGNOSIS — Z7902 Long term (current) use of antithrombotics/antiplatelets: Secondary | ICD-10-CM | POA: Diagnosis not present

## 2020-03-04 DIAGNOSIS — I10 Essential (primary) hypertension: Secondary | ICD-10-CM | POA: Diagnosis not present

## 2020-03-04 DIAGNOSIS — Z79899 Other long term (current) drug therapy: Secondary | ICD-10-CM | POA: Diagnosis not present

## 2020-03-04 DIAGNOSIS — F3132 Bipolar disorder, current episode depressed, moderate: Secondary | ICD-10-CM | POA: Diagnosis not present

## 2020-03-04 DIAGNOSIS — Z87891 Personal history of nicotine dependence: Secondary | ICD-10-CM | POA: Diagnosis not present

## 2020-03-04 DIAGNOSIS — F3161 Bipolar disorder, current episode mixed, mild: Secondary | ICD-10-CM | POA: Diagnosis not present

## 2020-03-04 DIAGNOSIS — Y909 Presence of alcohol in blood, level not specified: Secondary | ICD-10-CM | POA: Insufficient documentation

## 2020-03-04 DIAGNOSIS — E538 Deficiency of other specified B group vitamins: Secondary | ICD-10-CM

## 2020-03-04 DIAGNOSIS — R45851 Suicidal ideations: Secondary | ICD-10-CM | POA: Insufficient documentation

## 2020-03-04 DIAGNOSIS — F319 Bipolar disorder, unspecified: Secondary | ICD-10-CM | POA: Diagnosis present

## 2020-03-04 LAB — CBC WITH DIFFERENTIAL/PLATELET
Abs Immature Granulocytes: 0.01 10*3/uL (ref 0.00–0.07)
Basophils Absolute: 0 10*3/uL (ref 0.0–0.1)
Basophils Relative: 0 %
Eosinophils Absolute: 0.3 10*3/uL (ref 0.0–0.5)
Eosinophils Relative: 7 %
HCT: 38.7 % (ref 36.0–46.0)
Hemoglobin: 12.1 g/dL (ref 12.0–15.0)
Immature Granulocytes: 0 %
Lymphocytes Relative: 19 %
Lymphs Abs: 0.8 10*3/uL (ref 0.7–4.0)
MCH: 28.9 pg (ref 26.0–34.0)
MCHC: 31.3 g/dL (ref 30.0–36.0)
MCV: 92.4 fL (ref 80.0–100.0)
Monocytes Absolute: 0.5 10*3/uL (ref 0.1–1.0)
Monocytes Relative: 12 %
Neutro Abs: 2.4 10*3/uL (ref 1.7–7.7)
Neutrophils Relative %: 62 %
Platelets: 276 10*3/uL (ref 150–400)
RBC: 4.19 MIL/uL (ref 3.87–5.11)
RDW: 13.7 % (ref 11.5–15.5)
WBC: 4 10*3/uL (ref 4.0–10.5)
nRBC: 0 % (ref 0.0–0.2)

## 2020-03-04 LAB — COMPREHENSIVE METABOLIC PANEL
ALT: 29 U/L (ref 0–44)
AST: 22 U/L (ref 15–41)
Albumin: 3.9 g/dL (ref 3.5–5.0)
Alkaline Phosphatase: 90 U/L (ref 38–126)
Anion gap: 9 (ref 5–15)
BUN: 7 mg/dL (ref 6–20)
CO2: 23 mmol/L (ref 22–32)
Calcium: 8.8 mg/dL — ABNORMAL LOW (ref 8.9–10.3)
Chloride: 104 mmol/L (ref 98–111)
Creatinine, Ser: 0.61 mg/dL (ref 0.44–1.00)
GFR, Estimated: >60 mL/min (ref >60)
Glucose, Bld: 79 mg/dL (ref 70–99)
Potassium: 3.3 mmol/L — ABNORMAL LOW (ref 3.5–5.1)
Sodium: 136 mmol/L (ref 135–145)
Total Bilirubin: 0.4 mg/dL (ref 0.3–1.2)
Total Protein: 7 g/dL (ref 6.5–8.1)

## 2020-03-04 LAB — RAPID URINE DRUG SCREEN, HOSP PERFORMED
Amphetamines: NOT DETECTED
Barbiturates: NOT DETECTED
Benzodiazepines: POSITIVE — AB
Cocaine: NOT DETECTED
Opiates: NOT DETECTED
Tetrahydrocannabinol: NOT DETECTED

## 2020-03-04 LAB — RESP PANEL BY RT-PCR (FLU A&B, COVID) ARPGX2
Influenza A by PCR: NEGATIVE
Influenza B by PCR: NEGATIVE
SARS Coronavirus 2 by RT PCR: NEGATIVE

## 2020-03-04 LAB — ETHANOL: Alcohol, Ethyl (B): <10 mg/dL (ref <10)

## 2020-03-04 MED ORDER — METRONIDAZOLE 500 MG PO TABS: 500.0000 mg | ORAL_TABLET | Freq: Three times a day (TID) | ORAL | Status: DC

## 2020-03-04 MED ORDER — TOPIRAMATE 25 MG PO TABS
100.0000 mg | ORAL_TABLET | Freq: Every day | ORAL | Status: DC
  Administered 2020-03-04: 100 mg via ORAL
  Filled 2020-03-04: qty 4

## 2020-03-04 MED ORDER — AMPHETAMINE-DEXTROAMPHETAMINE 10 MG PO TABS
15.0000 mg | ORAL_TABLET | Freq: Three times a day (TID) | ORAL | Status: DC
  Administered 2020-03-05 (×2): 15 mg via ORAL
  Filled 2020-03-04 (×2): qty 2

## 2020-03-04 MED ORDER — GABAPENTIN 600 MG PO TABS
600.0000 mg | ORAL_TABLET | Freq: Three times a day (TID) | ORAL | Status: DC
  Filled 2020-03-04 (×5): qty 1

## 2020-03-04 MED ORDER — ALBUTEROL SULFATE HFA 108 (90 BASE) MCG/ACT IN AERS: 2.0000 | INHALATION_SPRAY | RESPIRATORY_TRACT | Status: DC | PRN

## 2020-03-04 MED ORDER — TRAMADOL HCL 50 MG PO TABS
50.0000 mg | ORAL_TABLET | Freq: Every morning | ORAL | Status: DC
  Administered 2020-03-05: 50 mg via ORAL
  Filled 2020-03-04: qty 1

## 2020-03-04 MED ORDER — HYDROCORTISONE (PERIANAL) 2.5 % EX CREA
1.0000 "application " | TOPICAL_CREAM | Freq: Every day | CUTANEOUS | Status: DC
  Filled 2020-03-04: qty 28.35

## 2020-03-04 MED ORDER — ATENOLOL 25 MG PO TABS
25.0000 mg | ORAL_TABLET | Freq: Two times a day (BID) | ORAL | Status: DC
  Administered 2020-03-04 – 2020-03-05 (×2): 25 mg via ORAL
  Filled 2020-03-04 (×2): qty 1

## 2020-03-04 MED ORDER — PROMETHAZINE HCL 12.5 MG PO TABS: 25.0000 mg | ORAL_TABLET | Freq: Four times a day (QID) | ORAL | Status: DC | PRN

## 2020-03-04 MED ORDER — PANTOPRAZOLE SODIUM 40 MG PO TBEC
40.0000 mg | DELAYED_RELEASE_TABLET | Freq: Every day | ORAL | Status: DC
  Administered 2020-03-04 – 2020-03-05 (×2): 40 mg via ORAL
  Filled 2020-03-04 (×2): qty 1

## 2020-03-04 MED ORDER — DIAZEPAM 2 MG PO TABS
4.0000 mg | ORAL_TABLET | Freq: Every day | ORAL | Status: DC
  Administered 2020-03-04 – 2020-03-05 (×2): 4 mg via ORAL
  Filled 2020-03-04 (×2): qty 2

## 2020-03-04 MED ORDER — ATORVASTATIN CALCIUM 40 MG PO TABS
40.0000 mg | ORAL_TABLET | Freq: Every day | ORAL | Status: DC
  Administered 2020-03-04 – 2020-03-05 (×2): 40 mg via ORAL
  Filled 2020-03-04 (×2): qty 1

## 2020-03-04 MED ORDER — QUETIAPINE FUMARATE 100 MG PO TABS
600.0000 mg | ORAL_TABLET | Freq: Every day | ORAL | Status: DC
Start: 2020-03-04 — End: 2020-03-05
  Administered 2020-03-04: 600 mg via ORAL
  Filled 2020-03-04: qty 6

## 2020-03-04 MED ORDER — ADULT MULTIVITAMIN W/MINERALS CH
1.0000 | ORAL_TABLET | Freq: Every day | ORAL | Status: DC
  Administered 2020-03-04 – 2020-03-05 (×2): 1 via ORAL
  Filled 2020-03-04 (×2): qty 1

## 2020-03-04 MED ORDER — CLOPIDOGREL BISULFATE 75 MG PO TABS
75.0000 mg | ORAL_TABLET | Freq: Every day | ORAL | Status: DC
  Administered 2020-03-04 – 2020-03-05 (×2): 75 mg via ORAL
  Filled 2020-03-04 (×2): qty 1

## 2020-03-04 MED ORDER — ACETAMINOPHEN 500 MG PO TABS
1000.0000 mg | ORAL_TABLET | Freq: Once | ORAL | Status: AC
  Administered 2020-03-04: 1000 mg via ORAL
  Filled 2020-03-04: qty 2

## 2020-03-04 MED ORDER — ONDANSETRON 4 MG PO TBDP: 4.0000 mg | ORAL_TABLET | Freq: Three times a day (TID) | ORAL | Status: DC | PRN

## 2020-03-04 MED ORDER — FERROUS SULFATE 325 (65 FE) MG PO TABS
325.0000 mg | ORAL_TABLET | Freq: Every day | ORAL | Status: DC
Start: 2020-03-05 — End: 2020-03-05
  Administered 2020-03-05: 325 mg via ORAL
  Filled 2020-03-04: qty 1

## 2020-03-04 NOTE — ED Triage Notes (Addendum)
Pt to the ED via [place] with IVC paperwork..  Pt's mother took out IVC paperwork because she feels she is a danger to herself.  Pt states her mother keeps her pain medication from her.  Pt states she is very disabled. Pt's mother took her pain meds away from her yesterday.  Pt states she has an open abdomen and a wet to dry dressing.  Pt denies SI/HI.

## 2020-03-04 NOTE — ED Notes (Signed)
TTS complete pt back in bed

## 2020-03-04 NOTE — ED Notes (Addendum)
IVC paperwork faxed to [phone]

## 2020-03-04 NOTE — ED Provider Notes (Signed)
[facility] EMERGENCY DEPARTMENT Provider Note   CSN: [id number] Arrival date & time: 03/04/20  1433     History No chief complaint on file.   [name] is a 45 y.o. female.  Pt presents to the ED today with IVC paperwork.  Pt said she is here because her mom would not give her the pain meds that have been prescribed to patient.  Pt denies si/hi.  Pt was seen at [facility] ED yesterday for syncope.  Work up negative.  The syncopal event was that pt "passed out" in the road. The pt's mother took out IVC papers because she was worried pt was trying to kill herself by lying in the road.         Past Medical History:  Diagnosis Date  . ADHD   . Anal fistula   . Anxiety   . Arthritis    pt denies 5/3 visit   . Bipolar affective ([place])   . Blood transfusion without reported diagnosis   . Colostomy in place [facility])   . CVA (cerebrovascular accident) ([place])    left cerebellar infarct found accidentally on CT (2020)   . Depression   . Diverticulitis 2008   perforated/ requiring resection  . Fatty liver   . Gallstones   . GERD (gastroesophageal reflux disease)    occ.  Marland Kitchen Headache    after a fall  . Herpes simplex   . History of colon polyps   . History of hiatal hernia    small  . History of kidney stones    cystoscopy  basket removal  . Hypertension   . Iron deficiency anemia   . Obese   . OCD (obsessive compulsive disorder)   . Panic attacks   . Pulmonary nodules   . Sarcoidosis   . Schizophrenia ([place])   . Shortness of breath dyspnea    pt denies on 5/3 visit   . Sleep apnea    mild, does not use c-pap machine  . Substance abuse ([place])    12 years ago-crack cocaine    Patient Active Problem List   Diagnosis Date Noted  . Iron deficiency anemia due to chronic blood loss 11/09/2019  . Sarcoidosis 04/26/2019  . Abnormal CT of the chest 03/23/2019  . Dyspnea 03/23/2019  . Hypokalemia 02/24/2019  . Sepsis ([place]) 02/24/2019  . Cerebellar infarct ([place])  11/16/2018  . CVA (cerebrovascular accident) ([place])   . Degeneration of lumbar or lumbosacral intervertebral disc 12/13/2014  . Anemia associated with nutritional deficiency 11/12/2014  . Bipolar affective disorder ([place]) 09/28/2014  . Anxiety state 09/28/2014  . S/P laparoscopic sleeve gastrectomy 01/2014 09/28/2014  . Abdominal wall seroma - chronic   . Seroma   . Cholelithiases 04/25/2013  . Cough 09/19/2011  . Smoker 09/19/2011  . GERD 03/04/2010  . RECTAL BLEEDING 03/04/2010  . DYSPHAGIA UNSPECIFIED 03/04/2010  . DYSPHAGIA 03/04/2010  . RUQ PAIN 03/04/2010    Past Surgical History:  Procedure Laterality Date  . Abdominal wall hernia  07/2007   open repair with lysis of adhesions  . APPLICATION OF WOUND VAC N/A 06/29/2019   Procedure: PLACEMENT OF WOUND VAC;  Surgeon: Greer Pickerel, MD;  Location: [facility] ORS;  Service: General;  Laterality: N/A;  . BRONCHIAL BRUSHINGS  04/11/2019   Procedure: BRONCHIAL BRUSHINGS;  Surgeon: Collene Gobble, MD;  Location: [facility] ENDOSCOPY;  Service: Pulmonary;;  . BRONCHIAL WASHINGS  04/11/2019   Procedure: BRONCHIAL WASHINGS;  Surgeon: Collene Gobble, MD;  Location: [facility]  ENDOSCOPY;  Service: Pulmonary;;  . COLONOSCOPY  03/13/2010   [id number] hemorrhoids likely cause of hematochezia, otherwise normal  . COLONOSCOPY  05/24/2019  . COLONOSCOPY WITH ESOPHAGOGASTRODUODENOSCOPY (EGD)  04/2019  . Colostomy reversal  11/2006  . DEBRIDEMENT OF ABDOMINAL WALL ABSCESS N/A 06/29/2019   Procedure: EXCISIONAL DEBRIDEMENT OF ABDOMINAL WALL/SUBCU SEROMA;  Surgeon: Greer Pickerel, MD;  Location: [facility]S;  Service: General;  Laterality: N/A;  . ESOPHAGOGASTRODUODENOSCOPY  03/13/2010   BTD:[id number] esophagus, status post passage of a Maloney dilator/Small hiatal hernia/ Antral erosions, status post biopsy  . EXAMINATION UNDER ANESTHESIA N/A 05/11/2012   Procedure: EXAM UNDER ANESTHESIA;  Surgeon: Donato Heinz, MD;  Location: [facility] ORS;  Service: General;  Laterality:  N/A;  . Exploratory laparotomy with resection  2008   colonoscopy  . FINE NEEDLE ASPIRATION  04/11/2019   Procedure: FINE NEEDLE ASPIRATION (FNA) LINEAR;  Surgeon: Collene Gobble, MD;  Location: [place] ENDOSCOPY;  Service: Pulmonary;;  . FINGER CLOSED REDUCTION Right 08/05/2012   Procedure: CLOSED REDUCTION RIGHT THUMB (FINGER);  Surgeon: Linna Hoff, MD;  Location: [place];  Service: Orthopedics;  Laterality: Right;  . HERNIA REPAIR     incisional hernia surgery   . Hx of abd wall seroma  08/2007   Drained via [place] in [place], [place]  . Hx of abd wall seroma  10/2007   Drained by Dr. Geroge Baseman in office  . INCISION AND DRAINAGE ABSCESS N/A 06/29/2019   Procedure: INCISION AND DRAINAGE;  Surgeon: Greer Pickerel, MD;  Location: [facility];  Service: General;  Laterality: N/A;  . IR RADIOLOGIST EVAL & MGMT  03/15/2019  . IR RADIOLOGIST EVAL & MGMT  04/06/2019  . Kidney stones    . LAPAROSCOPIC GASTRIC SLEEVE RESECTION N/A 02/13/2014   Procedure: LAPAROSCOPIC GASTRIC SLEEVE RESECTION LYSIS OF ADHESIONS, UPPER ENDOSCOPY;  Surgeon: [name], MD;  Location: [facility];  Service: General;  Laterality: N/A;  . LAPAROSCOPY N/A 09/28/2014   Procedure: LAPAROSCOPY DIAGNOSTIC, INCISION AND DRAINAGE WITH LAPAROSCOPIC EXPLORATION OF ABDOMINAL WALL SEROMA with ultrasound;  Surgeon: Greer Pickerel, MD;  Location: [facility];  Service: General;  Laterality: N/A;  . LUNG BIOPSY  04/11/2019   Procedure: LUNG BIOPSY;  Surgeon: Collene Gobble, MD;  Location: [facility] ENDOSCOPY;  Service: Pulmonary;;  distal trachea  . PLACEMENT OF SETON N/A 05/11/2012   Procedure: PLACEMENT OF SETON;  Surgeon: Donato Heinz, MD;  Location: [facility]S;  Service: General;  Laterality: N/A;  . root canal 7-16    . TONSILLECTOMY    . TREATMENT FISTULA ANAL     x 2  . UPPER GASTROINTESTINAL ENDOSCOPY  05/24/2019  . VIDEO BRONCHOSCOPY WITH ENDOBRONCHIAL ULTRASOUND N/A 04/11/2019   Procedure: VIDEO BRONCHOSCOPY WITH ENDOBRONCHIAL ULTRASOUND;  Surgeon: Collene Gobble, MD;  Location: [facility] ENDOSCOPY;  Service: Pulmonary;  Laterality: N/A;     OB History   No obstetric history on file.     Family History  Problem Relation Age of Onset  . Asthma Maternal Grandmother   . Colon polyps Maternal Grandmother   . Colon cancer Maternal Grandmother   . Esophageal cancer Neg Hx   . Rectal cancer Neg Hx   . Stomach cancer Neg Hx   . Pancreatic cancer Neg Hx   . Liver disease Neg Hx     Social History   Tobacco Use  . Smoking status: Former Smoker    Packs/day: 0.50    Years: 14.00    Pack years: 7.00  Types: E-cigarettes    Quit date: 12/20/2013    Years since quitting: 6.2  . Smokeless tobacco: Never Used  . Tobacco comment: Vapor cigarettes.  Vaping Use  . Vaping Use: Former  . Quit date: 02/24/2019  . Substances: Nicotine, Flavoring  . Devices: started 2015  Substance Use Topics  . Alcohol use: No    Alcohol/week: 0.0 standard drinks  . Drug use: Never    Home Medications Prior to Admission medications   Medication Sig Start Date End Date Taking? Authorizing Provider  albuterol (VENTOLIN HFA) 108 (90 Base) MCG/ACT inhaler INHALE 2 PUFFS INTO THE LUNGS EVERY 4 (FOUR) HOURS AS NEEDED FOR WHEEZING OR SHORTNESS OF BREATH. 01/02/20  Yes Pickard, Cammie Mcgee, MD  amphetamine-dextroamphetamine (ADDERALL) 15 MG tablet Take 15 mg by mouth 3 (three) times daily.  04/06/19  Yes [provider]  atenolol (TENORMIN) 25 MG tablet Take 25 mg by mouth 2 (two) times daily.  03/21/19  Yes [provider]  atorvastatin (LIPITOR) 40 MG tablet TAKE 1 TABLET BY MOUTH EVERY DAY Patient taking differently: Take 40 mg by mouth daily. 09/18/19  Yes Susy Frizzle, MD  clopidogrel (PLAVIX) 75 MG tablet Take 75 mg by mouth daily.   Yes [provider]  diazepam (VALIUM) 2 MG tablet Take 2 mg by mouth in the morning, at noon, in the evening, and at bedtime.   Yes [provider]  ferrous sulfate 325 (65 FE) MG tablet Take 325 mg by  mouth daily with breakfast.   Yes [provider]  gabapentin (NEURONTIN) 600 MG tablet Take 600 mg by mouth 3 (three) times daily.   Yes [provider]  hydrocortisone (ANUSOL-HC) 2.5 % rectal cream Place 1 application rectally at bedtime. 01/25/20  Yes Willia Craze, NP  Multiple Vitamins-Minerals (MULTIVITAMIN WITH MINERALS) tablet Take 1 tablet by mouth daily.    Yes [provider]  ondansetron (ZOFRAN ODT) 4 MG disintegrating tablet Take 1 tablet (4 mg total) by mouth every 8 (eight) hours as needed for nausea or vomiting. 02/06/19  Yes Susy Frizzle, MD  oxyCODONE-acetaminophen (PERCOCET) 7.5-325 MG tablet Take 1 tablet by mouth 3 (three) times daily as needed for moderate pain. 02/29/20  Yes [provider]  pantoprazole (PROTONIX) 40 MG tablet Take 1 tablet (40 mg total) by mouth daily. 05/24/19  Yes Ladene Artist, MD  QUEtiapine (SEROQUEL) 300 MG tablet Take 600 mg by mouth at bedtime.  11/21/14  Yes [provider]  topiramate (TOPAMAX) 100 MG tablet Take 100 mg by mouth at bedtime.  12/31/18  Yes [provider]  valACYclovir (VALTREX) 500 MG tablet TAKE 1 TABLET (500 MG TOTAL) BY MOUTH DAILY AS NEEDED (FEVER BLISTER). 09/14/19  Yes Susy Frizzle, MD    Allergies    Patient has no known allergies.  Review of Systems   Review of Systems  Psychiatric/Behavioral: The patient is nervous/anxious.   All other systems reviewed and are negative.   Physical Exam Updated Vital Signs BP (!) 136/100 (BP Location: Right Arm)   Pulse 100   Temp 98 F (36.7 C) (Oral)   Resp 18   Ht 5\' 6"  (1.676 m)   Wt 107 kg   LMP 02/20/2020   SpO2 96%   BMI 38.09 kg/m   Physical Exam Vitals and nursing note reviewed.  Constitutional:      Appearance: Normal appearance. She is obese.  HENT:     Head: Normocephalic and atraumatic.  Right Ear: External ear normal.     Left Ear: External ear normal.     Nose: Nose normal.      Mouth/Throat:     Mouth: Mucous membranes are moist.     Pharynx: Oropharynx is clear.  Eyes:     Extraocular Movements: Extraocular movements intact.     Conjunctiva/sclera: Conjunctivae normal.     Pupils: Pupils are equal, round, and reactive to light.  Cardiovascular:     Rate and Rhythm: Normal rate and regular rhythm.     Pulses: Normal pulses.     Heart sounds: Normal heart sounds.  Pulmonary:     Effort: Pulmonary effort is normal.     Breath sounds: Normal breath sounds.  Abdominal:     General: Abdomen is flat. Bowel sounds are normal.     Palpations: Abdomen is soft.     Comments: Open wound abdomen (chronic)  Musculoskeletal:        General: Normal range of motion.     Cervical back: Normal range of motion and neck supple.  Skin:    General: Skin is warm.     Capillary Refill: Capillary refill takes less than 2 seconds.  Neurological:     General: No focal deficit present.     Mental Status: She is alert and oriented to person, place, and time.  Psychiatric:        Mood and Affect: Mood is anxious.     ED Results / Procedures / Treatments   Labs (all labs ordered are listed, but only abnormal results are displayed) Labs Reviewed  COMPREHENSIVE METABOLIC PANEL - Abnormal; Notable for the following components:      Result Value   Potassium 3.3 (*)    Calcium 8.8 (*)    All other components within normal limits  RAPID URINE DRUG SCREEN, HOSP PERFORMED - Abnormal; Notable for the following components:   Benzodiazepines POSITIVE (*)    All other components within normal limits  RESP PANEL BY RT-PCR (FLU A&B, COVID) ARPGX2  ETHANOL  CBC WITH DIFFERENTIAL/PLATELET  POC URINE PREG, ED    EKG None  Radiology No results found.  Procedures Procedures (including critical care time)  Medications Ordered in ED Medications  albuterol (VENTOLIN HFA) 108 (90 Base) MCG/ACT inhaler 2 puff (has no administration in time range)  amphetamine-dextroamphetamine  (ADDERALL) tablet 15 mg (0 tablets Oral Hold 03/04/20 2126)  atenolol (TENORMIN) tablet 25 mg (25 mg Oral Given 03/04/20 2134)  atorvastatin (LIPITOR) tablet 40 mg (40 mg Oral Given 03/04/20 2135)  clopidogrel (PLAVIX) tablet 75 mg (75 mg Oral Given 03/04/20 2135)  diazepam (VALIUM) tablet 4 mg (4 mg Oral Given 03/04/20 2135)  ferrous sulfate tablet 325 mg (has no administration in time range)  gabapentin (NEURONTIN) tablet 600 mg (600 mg Oral Not Given 03/04/20 2136)  hydrocortisone (ANUSOL-HC) 2.5 % rectal cream 1 application (1 application Rectal Not Given 03/04/20 2138)  multivitamin with minerals tablet 1 tablet (1 tablet Oral Given 03/04/20 2134)  ondansetron (ZOFRAN-ODT) disintegrating tablet 4 mg (has no administration in time range)  pantoprazole (PROTONIX) EC tablet 40 mg (40 mg Oral Given 03/04/20 2135)  promethazine (PHENERGAN) tablet 25 mg (has no administration in time range)  QUEtiapine (SEROQUEL) tablet 600 mg (600 mg Oral Given 03/04/20 2133)  topiramate (TOPAMAX) tablet 100 mg (100 mg Oral Given 03/04/20 2135)  traMADol (ULTRAM) tablet 50 mg (has no administration in time range)  acetaminophen (TYLENOL) tablet 1,000 mg (1,000 mg Oral Given 03/04/20 2135)  ED Course  I have reviewed the triage vital signs and the nursing notes.  Pertinent labs & imaging results that were available during my care of the patient were reviewed by me and considered in my medical decision making (see chart for details).    MDM Rules/Calculators/A&P                          TTS consult ordered.  Home meds and diet ordered. Disposition per EMS.   Final Clinical Impression(s) / ED Diagnoses Final diagnoses:  Suicidal ideation    Rx / DC Orders ED Discharge Orders    None       Isla Pence, MD 03/04/20 2241

## 2020-03-04 NOTE — ED Notes (Signed)
Pt being assessed by TTS 

## 2020-03-04 NOTE — BH Assessment (Signed)
Comprehensive Clinical Assessment (CCA) Note  03/05/2020 [name] [id number]  Chief Complaint: No chief complaint on file.  Visit Diagnosis: F31.9, Bipolar I disorder, Current or most recent episode unspecified   CCA Screening, Triage and Referral (STR) Stephanie Sotello is a 45 year old patient who was involuntarily brought to [facility] via the police department under an IVC that was filed by her mother. The IVC states:  "The respondent is diagnosed with severe bipolar and is on medication for her condition. She also has some wounds that will not heal. She dressed in a gown and walked across road and lied down on side of the road. She claimed that she fainted but the petitioner feels the respondent is likely to harm herself. Petitioner states the respondent usually stays in bed all day because she says she's sick. The respondent is under the care of mental health doctor and he told petitioner that they thought she was in danger because of her behavior getting worse."  When pt was asked why she was in the hospital tonight, she stated, "I suffered with addiction all my life and I'm severely bipolar. I'm also an addict." Pt was unable to identify the drugs she has been addicted to but, after some thought, stated she first used cocaine or beer.  Pt stated, "I'm bipolar but I've had a lot of surgeries. I'm 45 years old and I'm severely physically... I'm an addict but I've tried my best since my son was born to be better. I'm still bipolar - I can't change that." Pt states she sees a pain management doctor to assist with what she's been experiencing with the open wound on her abdomen and that she sees a psychiatrist for medication to assist with her bipolar symptoms.  Pt eventually answered clinician's question re: why she's in the hospital tonight/why her mother IVCed her. Pt stated, "because she took my pain medicine away and she won't give it to me." When clinician inquired as to whether her mother was  afraid she was abusing or becoming addicted to her pain medication, pt did not answer for quite some time. She eventually shared that her grandmother was in hospice and was on liquid morphine. She states that her mother and brother were to keep an eye on her morphine at all times and were not to let it out of their site. Pt states that on two occasions she took her grandmother's medication and that, after she did this the second time, her grandmother fell out of bed and broke her neck, which ultimately killed her. Pt expressed much grief regarding this.  Pt denied SI, a hx of SI, any prior attempts to kill herself, any current plan to kill herself, or any prior hospitalizations for mental health concerns. Pt also denied HI, AVH, NSSIB, access to guns/weapons, or engagement with the legal system. When asked if pt has been abusing any substances, with the exception of her grandmother's morphine, pt looked down and did not answer.  Pt's protective factors include that she is involved in the medical care she needs and the support of her mother.  Pt's mother, [name], is who IVCed pt. She states that pt received a prescription for 75 Oxycodone on February 29, 2020 and that, as of today, 44 pills were gone. If this clinician's math is correct, the prescription allotted for 2.5 pills to be utilized for 30 days for the total of 75 pills. Including January 6 (the day she picked up the prescription) and today (March 05, 2019), there were 5 days in which she could have used the pills in the prescription. 5 days times 2.5 pills a day equals 12.5 pills, meaning that pt has taken 9.5 extra pills in 5 days (nearly 2 extra pills per day). However, pt's UDA (collected 03/04/2020 at 1521) was negative for opioids and was positive for benzodiazepines.  Pt's mother shares that on Sunday, March 04, 2019, pt got up, made herself a shake, and went back to her room. She states pt then went outside, walked across the highway,  and sat down on the pavement beside the highway; her mother states pt then lied down. Pt's mother states pt was taken to the hospital but was d/c without a mental health evaluation. Today pt's mother took pt to her PCP appointment and it was advised, at that time, that pt be taken to the ED.  Pt's orientation and memory was UTA. Pt appeared agitated throughout the assessment and was unable to directly answer the questions posed; at times her sentences would trail off and several times she sat in silence, though it's unclear if she did this in protest of the questions being posed or if she lost her train of thought. Pt's insight, judgement, and impulse control is poor at this time.   Recommendations for Services/Supports/Treatments: Margorie John, PA, reviewed pt's chart and information and determined pt should be observed overnight for safety and stability and re-assessed by psychiatry in the morning. This information was provided to pt's providers at [place].   Patient Reported Information How did you hear about [place]? Family/Friend  Referral name: [name], mother, Reita May pt  Referral phone number: [phone]   [place] do you see for routine medical problems? Other (Comment) (Pt sees a psychiatrist, pain management doctor, etc)  Practice/Facility Name: No data recorded Practice/Facility Phone Number: No data recorded Name of Contact: No data recorded Contact Number: No data recorded Contact Fax Number: No data recorded Prescriber Name: No data recorded Prescriber Address (if known): No data recorded  What Is the Reason for Your Visit/Call Today? Pt states, "I suffered with addiction all my life and I'm severely bipolar. I've had a lot of surgeries."  How Long Has This Been Causing You Problems? > than 6 months  What Do You Feel Would Help You the Most Today? Therapy   Have You Recently Been in Any Inpatient Treatment (Hospital/Detox/Crisis Center/28-Day Program)? No  Name/Location of  Program/Hospital:No data recorded How Long Were You There? No data recorded When Were You Discharged? No data recorded  Have You Ever Received Services From [facility] Before? Yes  Who Do You See at [facility]? Dr. Jenna Luo, PCP; multiple other providers for her wound care, pulmonology, oncology, nutrition, etc   Have You Recently Had Any Thoughts About Hurting Yourself? -- (Pt denies this, though pt's mother states that on Sunday (03/03/2020) pt walked across the highway without shoes on and laid down on the pavement.)  Are You Planning to [place] At This time? -- (Pt denies this, though pt's mother states that on Sunday (03/03/2020) pt walked across the highway without shoes on and laid down on the pavement.)   Have you Recently Had Thoughts About [place]? No  Explanation: No data recorded  Have You Used Any Alcohol or Drugs in the Past 24 Hours? -- (Unable to get a direct answer)  How Long Ago Did You Use Drugs or Alcohol? No data recorded What Did You Use and How Much? No data  recorded  Do You Currently Have a Therapist/Psychiatrist? Yes  Name of Therapist/Psychiatrist: Dr. Erling Cruz at [facility] Recently Discharged From Any Office Practice or Programs? No  Explanation of Discharge From Practice/Program: No data recorded    CCA Screening Triage Referral Assessment Type of Contact: Tele-Assessment  Is this Initial or Reassessment? Initial Assessment  Date Telepsych consult ordered in [facility]:  03/04/2020  Time Telepsych consult ordered in [facility]:  [place]   Patient Reported Information Reviewed? Yes  Patient Left Without Being Seen? No data recorded Reason for Not Completing Assessment: No data recorded  Collateral Involvement: [name], pt's mother and the person who IVCed pt   Does Patient Have a Stage manager Guardian? No data recorded Name and Contact of Legal Guardian: No data  recorded If Minor and Not Living with Parent(s), Who has Custody? N/A  Is CPS involved or ever been involved? Never  Is APS involved or ever been involved? Never   Patient Determined To Be At Risk for Harm To Self or Others Based on Review of Patient Reported Information or Presenting Complaint? -- (Pt denies this, though pt's mother states that on Sunday (03/03/2020) pt walked across the highway without shoes on and laid down on the pavement.)  Method: No data recorded Availability of Means: No data recorded Intent: No data recorded Notification Required: No data recorded Additional Information for Danger to Others Potential: No data recorded Additional Comments for Danger to Others Potential: No data recorded Are There Guns or Other Weapons in Your Home? No data recorded Types of Guns/Weapons: No data recorded Are These Weapons Safely Secured?                            No data recorded Who Could Verify You Are Able To Have These Secured: No data recorded Do You Have any Outstanding Charges, Pending Court Dates, Parole/Probation? No data recorded Contacted To Inform of Risk of Harm To Self or Others: Family/Significant Other: (Pt's mother is aware of pt's actions and IVCed pt)   Location of Assessment: [facility]   Does Patient Present under Involuntary Commitment? Yes  IVC Papers Initial File Date: 03/04/2020   [place] of Residence: [place]   Patient Currently Receiving the Following Services: Medication Management   Determination of Need: Urgent (48 hours)   Options For Referral: Other: Comment Margorie John, PA, determined pt should be observed overnight and re-assessed in the morning.)     CCA Biopsychosocial Intake/Chief Complaint:  Pt states, "I suffered with addiction all my life and I'm severely bipolar. I've had a lot of surgeries."  Current Symptoms/Problems: Pt states she has an open wound that won't heal. She states she has severe bipolar  disorder.   Patient Reported Schizophrenia/Schizoaffective Diagnosis in Past: No   Strengths: Pt states she has been seeing a pain management doctor. She sees a Teacher, music.  Preferences: Pt would like to be recommended/receive referral information for a therapist.  Abilities: N/A   Type of Services Patient Feels are Needed: Pt would like a therapist and to continue to see her psychiatrist.   Initial Clinical Notes/Concerns: N/A   Mental Health Symptoms Depression:  Hopelessness   Duration of Depressive symptoms: Greater than two weeks   Mania:  Recklessness   Anxiety:   Difficulty concentrating   Psychosis:  Grossly disorganized speech; Affective flattening/alogia/avolition   Duration of Psychotic symptoms: -- (UTA)   Trauma:  None  Obsessions:  None   Compulsions:  None   Inattention:  None   Hyperactivity/Impulsivity:  N/A   Oppositional/Defiant Behaviors:  None   Emotional Irregularity:  Mood lability; Potentially harmful impulsivity   Other Mood/Personality Symptoms:  None noted    Mental Status Exam Appearance and self-care  Stature:  Average   Weight:  Overweight   Clothing:  -- (Pt is dressed in scrubs)   Grooming:  Normal   Cosmetic use:  None   Posture/gait:  Normal   Motor activity:  Slowed   Sensorium  Attention:  Inattentive   Concentration:  Scattered   Orientation:  -- (UTA)   Recall/memory:  -- (UTA)   Affect and Mood  Affect:  Blunted   Mood:  Depressed   Relating  Eye contact:  Normal   Facial expression:  Responsive   Attitude toward examiner:  Guarded; Resistant   Thought and Language  Speech flow: Slow   Thought content:  -- (Pt had difficulties answering the questions posed and did not answer several questions, instead just sitting in silence)   Preoccupation:  None   Hallucinations:  None   Organization:  No data recorded  [employer] of Knowledge:  Average   Intelligence:   Average   Abstraction:  -- (UTA)   Judgement:  Impaired   Reality Testing:  Variable   Insight:  Lacking   Decision Making:  Impulsive   Social Functioning  Social Maturity:  -- Special educational needs teacher)   Social Judgement:  -- (UTA)   Stress  Stressors:  Grief/losses; Illness   Coping Ability:  Exhausted   Skill Deficits:  Decision making; Self-care; Self-control   Supports:  Family; Friends/Service system     Religion: Religion/Spirituality Are You A Religious Person?:  (N/A) How Might This Affect Treatment?: N/A  Leisure/Recreation: Leisure / Recreation Do You Have Hobbies?:  (N/A)  Exercise/Diet: Exercise/Diet Do You Exercise?:  (N/A) Have You Gained or Lost A Significant Amount of Weight in the Past Six Months?:  (N/A) Do You Follow a Special Diet?:  (N/A) Do You Have Any Trouble Sleeping?:  (N/A)   CCA Employment/Education Employment/Work Situation: Employment / Work Situation Employment situation: On disability Why is patient on disability: UTA How long has patient been on disability: N/A Patient's job has been impacted by current illness:  (N/A) What is the longest time patient has a held a job?: N/A Where was the patient employed at that time?: N/A Has patient ever been in the [employer]?:  (N/A)  Education: Education Is Patient Currently Attending School?: No Last Grade Completed:  (N/A) Name of High School: N/A Did Teacher, adult education From [employer]?:  (N/A) Did You Attend College?:  (N/A) Did You Attend Graduate School?:  (N/A) Did You Have Any Special Interests In School?: N/A Did You Have An Individualized Education Program (IIEP):  (N/A) Did You Have Any Difficulty At School?:  (N/A) Patient's Education Has Been Impacted by Current Illness:  (N/A)   CCA Family/Childhood History Family and Relationship History: Family history Marital status: Single Are you sexually active?:  (N/A) What is your sexual orientation?: N/A Has your sexual activity been  affected by drugs, alcohol, medication, or emotional stress?: N/A Does patient have children?: Yes How many children?:  (At last one son) How is patient's relationship with their children?: N/A  Childhood History:  Childhood History By whom was/is the patient raised?:  (N/A) Additional childhood history information: N/A Description of patient's relationship with caregiver when they were a  child: N/A Patient's description of current relationship with people who raised him/her: N/A How were you disciplined when you got in trouble as a child/adolescent?: N/A Does patient have siblings?:  (N/A) Did patient suffer any verbal/emotional/physical/sexual abuse as a child?:  (UTA) Did patient suffer from severe childhood neglect?:  (UTA) Has patient ever been sexually abused/assaulted/raped as an adolescent or adult?:  (UTA) Was the patient ever a victim of a crime or a disaster?:  (UTA) Witnessed domestic violence?:  (UTA) Has patient been affected by domestic violence as an adult?:  Special educational needs teacher)  Child/Adolescent Assessment:     CCA Substance Use Alcohol/Drug Use: Alcohol / Drug Use Pain Medications: Please see MAR Prescriptions: Please see MAR Over the Counter: Please see MAR History of alcohol / drug use?: Yes Longest period of sobriety (when/how long): Unknown Negative Consequences of Use: Personal relationships Substance #1 Name of Substance 1: Morphine 1 - Age of First Use: Unknown 1 - Amount (size/oz): Unknown 1 - Frequency: 2x that pt acknowledged 1 - Duration: Unknown 1 - Last Use / Amount: Within the last 4 months; pt admitted to taking 2 doses of her grandmother's morphine Substance #2 Name of Substance 2: EtOH 2 - Age of First Use: Unknown 2 - Amount (size/oz): Unknown 2 - Frequency: Unknown 2 - Duration: Unknown 2 - Last Use / Amount: Pt did not answer this quesiton (pt sat in silence after she was asked) Substance #3 Name of Substance 3: Cocaine/crack cocaine 3 - Age of  First Use: Unknown 3 - Amount (size/oz): Unknown 3 - Frequency: Unknown 3 - Duration: Unknown 3 - Last Use / Amount: Pt did not answer this quesiton (pt sat in silence after she was asked) Substance #4 Name of Substance 4: Benzodiazepines 4 - Age of First Use: Unknown 4 - Amount (size/oz): Unknown 4 - Frequency: Unknown 4 - Duration: Unknown 4 - Last Use / Amount: Pt did not answer this quesiton (pt sat in silence after she was asked)                 ASAM's:  Six Dimensions of Multidimensional Assessment  Dimension 1:  Acute Intoxication and/or Withdrawal Potential:   Dimension 1:  Description of individual's past and current experiences of substance use and withdrawal:  (UTA)  Dimension 2:  Biomedical Conditions and Complications:   Dimension 2:  Description of patient's biomedical conditions and  complications:  (UTA)  Dimension 3:  Emotional, Behavioral, or Cognitive Conditions and Complications:  Dimension 3:  Description of emotional, behavioral, or cognitive conditions and complications:  (UTA)  Dimension 4:  Readiness to Change:  Dimension 4:  Description of Readiness to Change criteria:  (UTA)  Dimension 5:  Relapse, Continued use, or Continued Problem Potential:  Dimension 5:  Relapse, continued use, or continued problem potential critiera description:  (UTA)  Dimension 6:  Recovery/Living Environment:  Dimension 6:  Recovery/Iiving environment criteria description:  (UTA)  ASAM Severity Score:    ASAM Recommended Level of Treatment:     Substance use Disorder (SUD)    Recommendations for Services/Supports/Treatments: Margorie John, PA, reviewed pt's chart and information and determined pt should be observed overnight for safety and stability and re-assessed by psychiatry in the morning. This information was provided to pt's providers at [place].    DSM5 Diagnoses: Patient Active Problem List   Diagnosis Date Noted  . Iron deficiency anemia due to chronic blood loss  11/09/2019  . Sarcoidosis 04/26/2019  . Abnormal CT of the chest  03/23/2019  . Dyspnea 03/23/2019  . Hypokalemia 02/24/2019  . Sepsis ([place]) 02/24/2019  . Cerebellar infarct ([place]) 11/16/2018  . CVA (cerebrovascular accident) ([place])   . Degeneration of lumbar or lumbosacral intervertebral disc 12/13/2014  . Anemia associated with nutritional deficiency 11/12/2014  . Bipolar affective disorder ([place]) 09/28/2014  . Anxiety state 09/28/2014  . S/P laparoscopic sleeve gastrectomy 01/2014 09/28/2014  . Abdominal wall seroma - chronic   . Seroma   . Cholelithiases 04/25/2013  . Cough 09/19/2011  . Smoker 09/19/2011  . GERD 03/04/2010  . RECTAL BLEEDING 03/04/2010  . DYSPHAGIA UNSPECIFIED 03/04/2010  . DYSPHAGIA 03/04/2010  . RUQ PAIN 03/04/2010    Patient Centered Plan: Patient is on the following Treatment Plan(s):  Impulse Control and Substance Abuse   Referrals to Alternative Service(s): Referred to Alternative Service(s):   Place:   Date:   Time:    Referred to Alternative Service(s):   Place:   Date:   Time:    Referred to Alternative Service(s):   Place:   Date:   Time:    Referred to Alternative Service(s):   Place:   Date:   Time:     Dannielle Burn, LMFT

## 2020-03-05 ENCOUNTER — Other Ambulatory Visit: Payer: Self-pay | Admitting: Hematology and Oncology

## 2020-03-05 DIAGNOSIS — D500 Iron deficiency anemia secondary to blood loss (chronic): Secondary | ICD-10-CM

## 2020-03-05 DIAGNOSIS — F3161 Bipolar disorder, current episode mixed, mild: Secondary | ICD-10-CM

## 2020-03-05 MED ORDER — GABAPENTIN 300 MG PO CAPS
600.0000 mg | ORAL_CAPSULE | Freq: Three times a day (TID) | ORAL | Status: DC
  Administered 2020-03-05: 600 mg via ORAL
  Filled 2020-03-05: qty 2

## 2020-03-05 MED ORDER — POTASSIUM CHLORIDE CRYS ER 20 MEQ PO TBCR
20.0000 meq | EXTENDED_RELEASE_TABLET | Freq: Once | ORAL | Status: AC
  Administered 2020-03-05: 20 meq via ORAL
  Filled 2020-03-05: qty 1

## 2020-03-05 MED ORDER — BISACODYL 5 MG PO TBEC
5.0000 mg | DELAYED_RELEASE_TABLET | Freq: Once | ORAL | Status: AC
  Administered 2020-03-05: 5 mg via ORAL
  Filled 2020-03-05: qty 1

## 2020-03-05 NOTE — Discharge Instructions (Addendum)
Follow-up as instructed by behavioral health 

## 2020-03-05 NOTE — Consult Note (Signed)
Telepsych Consultation   Location of Patient: [facility] Location of Provider: [facility]  Patient Identification: [name] MRN:  [id number] Principal Diagnosis: Bipolar affective disorder [facility]) Diagnosis:  Principal Problem:   Bipolar affective disorder ([place])   Total Time spent with patient: 30 minutes  HPI:  Reassessment: Patient seen via telepsych. Chart reviewed. [name] is a 45 year old female with history of bipolar disorder and substance use disorder who presented to [facility] under IVC from her mother yesterday due to reportedly lying down on the side of the road. Per notes she was seen in [place] ED on 03/03/20 after she reported she passed out while getting mail from the [place], and she was noted to be anxious and pressured in their ED.  On assessment today, patient appears irritable and loud at times but cooperative with normal rate of speech. She is alert and oriented x3. She strongly denies any SI and denies any thoughts of wanting to hurt herself- "I am a [place], and I would never hurt my son like that." She states she fell by the road because her mother had withheld her pain medication, and she was weak. She denies lying by the road on purpose. She reports frustration and anger saying that her mother has been taking away her pain medications and Valium and not allowing her to take them as prescribed. Patient states that she took some of her oxycodone out of the prescription bottle and stored them in another hidden bottle so that her mother would not take them all away from her. She reports taking the oxycodone PRN as prescribed when her mother does not withhold it. UDS in ED positive for BZDs only, and she states this is because she only takes the opioid medication PRN. She reports stress levels regarding chronic pain from nonhealing wound, but strongly denies any SI/HI/AVH. She shows no signs of responding to internal stimuli. No paranoid or delusional thought  content expressed. She is seen by psychiatry through [facility] and reports compliance with medications. I reinforced with patient the importance of only taking medications as prescribed rather than overtaking, particularly while taking both benzodiazepines and opioids due to increased risk of overdose, and patient states understanding. She continues to deny overtaking medications.     Per TTS assessment: [name] is a 45 year old patient who was involuntarily brought to [place] via the police department under an IVC that was filed by her mother. The IVC states:  "The respondent is diagnosed with severe bipolar and is on medication for her condition. She also has some wounds that will not heal. She dressed in a gown and walked across road and lied down on side of the road. She claimed that she fainted but the petitioner feels the respondent is likely to harm herself. Petitioner states the respondent usually stays in bed all day because she says she's sick. The respondent is under the care of mental health doctor and he told petitioner that they thought she was in danger because of her behavior getting worse."  When pt was asked why she was in the hospital tonight, she stated, "I suffered with addiction all my life and I'm severely bipolar. I'm also an addict." Pt was unable to identify the drugs she has been addicted to but, after some thought, stated she first used cocaine or beer.  Pt stated, "I'm bipolar but I've had a lot of surgeries. I'm 45 years old and I'm severely physically... I'm an addict but I've tried my best since my  son was born to be better. I'm still bipolar - I can't change that." Pt states she sees a pain management doctor to assist with what she's been experiencing with the open wound on her abdomen and that she sees a psychiatrist for medication to assist with her bipolar symptoms.  Pt eventually answered clinician's question re: why she's in the hospital tonight/why her mother  IVCed her. Pt stated, "because she took my pain medicine away and she won't give it to me." When clinician inquired as to whether her mother was afraid she was abusing or becoming addicted to her pain medication, pt did not answer for quite some time. She eventually shared that her grandmother was in hospice and was on liquid morphine. She states that her mother and brother were to keep an eye on her morphine at all times and were not to let it out of their site. Pt states that on two occasions she took her grandmother's medication and that, after she did this the second time, her grandmother fell out of bed and broke her neck, which ultimately killed her. Pt expressed much grief regarding this.  Pt denied SI, a hx of SI, any prior attempts to kill herself, any current plan to kill herself, or any prior hospitalizations for mental health concerns. Pt also denied HI, AVH, NSSIB, access to guns/weapons, or engagement with the legal system. When asked if pt has been abusing any substances, with the exception of her grandmother's morphine, pt looked down and did not answer.  Pt's protective factors include that she is involved in the medical care she needs and the support of her mother.  Pt's mother, [name], is who IVCed pt. She states that pt received a prescription for 75 Oxycodone on February 29, 2020 and that, as of today, 22 pills were gone. If this clinician's math is correct, the prescription allotted for 2.5 pills to be utilized for 30 days for the total of 75 pills. Including January 6 (the day she picked up the prescription) and today (March 05, 2019), there were 5 days in which she could have used the pills in the prescription. 5 days times 2.5 pills a day equals 12.5 pills, meaning that pt has taken 9.5 extra pills in 5 days (nearly 2 extra pills per day). However, pt's UDA (collected 03/04/2020 at 1521) was negative for opioids and was positive for benzodiazepines.  Pt's mother shares that  on Sunday, March 04, 2019, pt got up, made herself a shake, and went back to her room. She states pt then went outside, walked across the highway, and sat down on the pavement beside the highway; her mother states pt then lied down. Pt's mother states pt was taken to the hospital but was d/c without a mental health evaluation. Today pt's mother took pt to her PCP appointment and it was advised, at that time, that pt be taken to the ED.  Pt's orientation and memory was UTA. Pt appeared agitated throughout the assessment and was unable to directly answer the questions posed; at times her sentences would trail off and several times she sat in silence, though it's unclear if she did this in protest of the questions being posed or if she lost her train of thought. Pt's insight, judgement, and impulse control is poor at this time.  Disposition: Patient denies SI/HI/AVH and shows no evidence of acute risk of harm to self or others. I have concern patient might be overtaking medications and reinforced with patient the  importance of only taking medications as prescribed. Patient expresses understanding. She is psych cleared for discharge to continue outpatient follow-up. CSW and ED staff updated.  Past Psychiatric History: See above  Risk to Self:   Risk to Others:   Prior Inpatient Therapy:   Prior Outpatient Therapy:    Past Medical History:  Past Medical History:  Diagnosis Date  . ADHD   . Anal fistula   . Anxiety   . Arthritis    pt denies 5/3 visit   . Bipolar affective ([place])   . Blood transfusion without reported diagnosis   . Colostomy in place [facility])   . CVA (cerebrovascular accident) ([place])    left cerebellar infarct found accidentally on CT (2020)   . Depression   . Diverticulitis 2008   perforated/ requiring resection  . Fatty liver   . Gallstones   . GERD (gastroesophageal reflux disease)    occ.  Marland Kitchen Headache    after a fall  . Herpes simplex   . History of colon polyps   .  History of hiatal hernia    small  . History of kidney stones    cystoscopy  basket removal  . Hypertension   . Iron deficiency anemia   . Obese   . OCD (obsessive compulsive disorder)   . Panic attacks   . Pulmonary nodules   . Sarcoidosis   . Schizophrenia ([place])   . Shortness of breath dyspnea    pt denies on 5/3 visit   . Sleep apnea    mild, does not use c-pap machine  . Substance abuse ([place])    12 years ago-crack cocaine    Past Surgical History:  Procedure Laterality Date  . Abdominal wall hernia  07/2007   open repair with lysis of adhesions  . APPLICATION OF WOUND VAC N/A 06/29/2019   Procedure: PLACEMENT OF WOUND VAC;  Surgeon: Greer Pickerel, MD;  Location: [facility];  Service: General;  Laterality: N/A;  . BRONCHIAL BRUSHINGS  04/11/2019   Procedure: BRONCHIAL BRUSHINGS;  Surgeon: Collene Gobble, MD;  Location[facility] ENDOSCOPY;  Service: Pulmonary;;  . BRONCHIAL WASHINGS  04/11/2019   Procedure: BRONCHIAL WASHINGS;  Surgeon: Collene Gobble, MD;  Location: [facility] ENDOSCOPY;  Service: Pulmonary;;  . COLONOSCOPY  03/13/2010   HYI:FOYDXAJO hemorrhoids likely cause of hematochezia, otherwise normal  . COLONOSCOPY  05/24/2019  . COLONOSCOPY WITH ESOPHAGOGASTRODUODENOSCOPY (EGD)  04/2019  . Colostomy reversal  11/2006  . DEBRIDEMENT OF ABDOMINAL WALL ABSCESS N/A 06/29/2019   Procedure: EXCISIONAL DEBRIDEMENT OF ABDOMINAL WALL/SUBCU SEROMA;  Surgeon: Greer Pickerel, MD;  [facility];  Service: General;  Laterality: N/A;  . ESOPHAGOGASTRODUODENOSCOPY  03/13/2010   INO:[id number] esophagus, status post passage of a Maloney dilator/Small hiatal hernia/ Antral erosions, status post biopsy  . EXAMINATION UNDER ANESTHESIA N/A 05/11/2012   Procedure: EXAM UNDER ANESTHESIA;  Surgeon: Donato Heinz, MD;  Location: [facility];  Service: General;  Laterality: N/A;  . Exploratory laparotomy with resection  2008   colonoscopy  . FINE NEEDLE ASPIRATION  04/11/2019   Procedure: FINE NEEDLE  ASPIRATION (FNA) LINEAR;  Surgeon: Collene Gobble, MD;  Location: [place] ENDOSCOPY;  Service: Pulmonary;;  . FINGER CLOSED REDUCTION Right 08/05/2012   Procedure: CLOSED REDUCTION RIGHT THUMB (FINGER);  Surgeon: Linna Hoff, MD;  Location: [place];  Service: Orthopedics;  Laterality: Right;  . HERNIA REPAIR     incisional hernia surgery   . Hx of abd wall seroma  08/2007   Drained  via [place] in [place], [place]  . Hx of abd wall seroma  10/2007   Drained by Dr. Geroge Baseman in office  . INCISION AND DRAINAGE ABSCESS N/A 06/29/2019   Procedure: INCISION AND DRAINAGE;  Surgeon: Greer Pickerel, MD;  [facility];  Service: General;  Laterality: N/A;  . IR RADIOLOGIST EVAL & MGMT  03/15/2019  . IR RADIOLOGIST EVAL & MGMT  04/06/2019  . Kidney stones    . LAPAROSCOPIC GASTRIC SLEEVE RESECTION N/A 02/13/2014   Procedure: LAPAROSCOPIC GASTRIC SLEEVE RESECTION LYSIS OF ADHESIONS, UPPER ENDOSCOPY;  Surgeon: [name], MD;  [facility];  Service: General;  Laterality: N/A;  . LAPAROSCOPY N/A 09/28/2014   Procedure: LAPAROSCOPY DIAGNOSTIC, INCISION AND DRAINAGE WITH LAPAROSCOPIC EXPLORATION OF ABDOMINAL WALL SEROMA with ultrasound;  Surgeon: Greer Pickerel, MD;  [facility];  Service: General;  Laterality: N/A;  . LUNG BIOPSY  04/11/2019   Procedure: LUNG BIOPSY;  Surgeon: Collene Gobble, MD;  Location: [facility] ENDOSCOPY;  Service: Pulmonary;;  distal trachea  . PLACEMENT OF SETON N/A 05/11/2012   Procedure: PLACEMENT OF SETON;  Surgeon: Donato Heinz, MD;  Location: [facility];  Service: General;  Laterality: N/A;  . root canal 7-16    . TONSILLECTOMY    . TREATMENT FISTULA ANAL     x 2  . UPPER GASTROINTESTINAL ENDOSCOPY  05/24/2019  . VIDEO BRONCHOSCOPY WITH ENDOBRONCHIAL ULTRASOUND N/A 04/11/2019   Procedure: VIDEO BRONCHOSCOPY WITH ENDOBRONCHIAL ULTRASOUND;  Surgeon: Collene Gobble, MD;  Location: [facility] ENDOSCOPY;  Service: Pulmonary;  Laterality: N/A;   Family History:  Family History  Problem Relation Age of  Onset  . Asthma Maternal Grandmother   . Colon polyps Maternal Grandmother   . Colon cancer Maternal Grandmother   . Esophageal cancer Neg Hx   . Rectal cancer Neg Hx   . Stomach cancer Neg Hx   . Pancreatic cancer Neg Hx   . Liver disease Neg Hx    Family Psychiatric  History: Unknown Social History:  Social History   Substance and Sexual Activity  Alcohol Use No  . Alcohol/week: 0.0 standard drinks     Social History   Substance and Sexual Activity  Drug Use Never    Social History   Socioeconomic History  . Marital status: Single    Spouse name: Not on file  . Number of children: 2  . Years of education: 57   . Highest education level: Not on file  Occupational History  . Occupation: Insurance claims handler: UNEMPLOYED  Tobacco Use  . Smoking status: Former Smoker    Packs/day: 0.50    Years: 14.00    Pack years: 7.00    Types: E-cigarettes    Quit date: 12/20/2013    Years since quitting: 6.2  . Smokeless tobacco: Never Used  . Tobacco comment: Vapor cigarettes.  Vaping Use  . Vaping Use: Former  . Quit date: 02/24/2019  . Substances: Nicotine, Flavoring  . Devices: started 2015  Substance and Sexual Activity  . Alcohol use: No    Alcohol/week: 0.0 standard drinks  . Drug use: Never  . Sexual activity: Not Currently    Birth control/protection: Abstinence  Other Topics Concern  . Not on file  Social History Narrative  . Not on file   Social Determinants of Health   Financial Resource Strain: Not on file  Food Insecurity: Not on file  Transportation Needs: Not on file  Physical Activity: Not on file  Stress:  Not on file  Social Connections: Not on file   Additional Social History:    Allergies:  No Known Allergies  Labs:  Results for orders placed or performed during the hospital encounter of 03/04/20 (from the past 48 hour(s))  Resp Panel by RT-PCR (Flu A&B, Covid) Nasopharyngeal Swab     Status: None   Collection Time: 03/04/20   3:21 PM   Specimen: Nasopharyngeal Swab; Nasopharyngeal(NP) swabs in vial transport medium  Result Value Ref Range   SARS Coronavirus 2 by RT PCR NEGATIVE NEGATIVE    Comment: (NOTE) SARS-CoV-2 target nucleic acids are NOT DETECTED.  The SARS-CoV-2 RNA is generally detectable in upper respiratory specimens during the acute phase of infection. The lowest concentration of SARS-CoV-2 viral copies this assay can detect is 138 copies/mL. A negative result does not preclude SARS-Cov-2 infection and should not be used as the sole basis for treatment or other patient management decisions. A negative result may occur with  improper specimen collection/handling, submission of specimen other than nasopharyngeal swab, presence of viral mutation(s) within the areas targeted by this assay, and inadequate number of viral copies(<138 copies/mL). A negative result must be combined with clinical observations, patient history, and epidemiological information. The expected result is Negative.  Fact Sheet for Patients:  EntrepreneurPulse.com.au  Fact Sheet for Healthcare Providers:  IncredibleEmployment.be  This test is no t yet approved or cleared by the [place] FDA and  has been authorized for detection and/or diagnosis of SARS-CoV-2 by FDA under an Emergency Use Authorization (EUA). This EUA will remain  in effect (meaning this test can be used) for the duration of the COVID-19 declaration under Section 564(b)(1) of the Act, 21 U.S.C.section 360bbb-3(b)(1), unless the authorization is terminated  or revoked sooner.       Influenza A by PCR NEGATIVE NEGATIVE   Influenza B by PCR NEGATIVE NEGATIVE    Comment: (NOTE) The Xpert Xpress SARS-CoV-2/FLU/RSV plus assay is intended as an aid in the diagnosis of influenza from Nasopharyngeal swab specimens and should not be used as a sole basis for treatment. Nasal washings and aspirates are unacceptable for  Xpert Xpress SARS-CoV-2/FLU/RSV testing.  Fact Sheet for Patients: EntrepreneurPulse.com.au  Fact Sheet for Healthcare Providers: IncredibleEmployment.be  This test is not yet approved or cleared by the [place] FDA and has been authorized for detection and/or diagnosis of SARS-CoV-2 by FDA under an Emergency Use Authorization (EUA). This EUA will remain in effect (meaning this test can be used) for the duration of the COVID-19 declaration under Section 564(b)(1) of the Act, 21 U.S.C. section 360bbb-3(b)(1), unless the authorization is terminated or revoked.  Performed at [facility], [address] [place], [place] [address]   Urine rapid drug screen (hosp performed)     Status: Abnormal   Collection Time: 03/04/20  3:21 PM  Result Value Ref Range   Opiates NONE DETECTED NONE DETECTED   Cocaine NONE DETECTED NONE DETECTED   Benzodiazepines POSITIVE (A) NONE DETECTED   Amphetamines NONE DETECTED NONE DETECTED   Tetrahydrocannabinol NONE DETECTED NONE DETECTED   Barbiturates NONE DETECTED NONE DETECTED    Comment: (NOTE) DRUG SCREEN FOR MEDICAL PURPOSES ONLY.  IF CONFIRMATION IS NEEDED FOR ANY PURPOSE, NOTIFY LAB WITHIN 5 DAYS.  LOWEST DETECTABLE LIMITS FOR URINE DRUG SCREEN Drug Class                     Cutoff (ng/mL) Amphetamine and metabolites    1000 Barbiturate and metabolites  200 Benzodiazepine                 [id number] Tricyclics and metabolites     300 Opiates and metabolites        300 Cocaine and metabolites        300 THC                            50 Performed at [facility], [address], [place], [place] [address]   Comprehensive metabolic panel     Status: Abnormal   Collection Time: 03/04/20  4:10 PM  Result Value Ref Range   Sodium 136 135 - 145 mmol/L   Potassium 3.3 (L) 3.5 - 5.1 mmol/L   Chloride 104 98 - 111 mmol/L   CO2 23 22 - 32 mmol/L   Glucose, Bld 79 70 - 99 mg/dL    Comment: Glucose reference  range applies only to samples taken after fasting for at least 8 hours.   BUN 7 6 - 20 mg/dL   Creatinine, Ser 0.61 0.44 - 1.00 mg/dL   Calcium 8.8 (L) 8.9 - 10.3 mg/dL   Total Protein 7.0 6.5 - 8.1 g/dL   Albumin 3.9 3.5 - 5.0 g/dL   AST 22 15 - 41 U/L   ALT 29 0 - 44 U/L   Alkaline Phosphatase 90 38 - 126 U/L   Total Bilirubin 0.4 0.3 - 1.2 mg/dL   GFR, Estimated >60 >60 mL/min    Comment: (NOTE) Calculated using the CKD-EPI Creatinine Equation (2021)    Anion gap 9 5 - 15    Comment: Performed at [facility], [address], [place], [place] [address]  Ethanol     Status: None   Collection Time: 03/04/20  4:10 PM  Result Value Ref Range   Alcohol, Ethyl (B) <10 <10 mg/dL    Comment: (NOTE) Lowest detectable limit for serum alcohol is 10 mg/dL.  For medical purposes only. Performed at [facility], [address], [place], [place] [address]   CBC with Diff     Status: None   Collection Time: 03/04/20  4:10 PM  Result Value Ref Range   WBC 4.0 4.0 - 10.5 K/uL   RBC 4.19 3.87 - 5.11 MIL/uL   Hemoglobin 12.1 12.0 - 15.0 g/dL   HCT 38.7 36.0 - 46.0 %   MCV 92.4 80.0 - 100.0 fL   MCH 28.9 26.0 - 34.0 pg   MCHC 31.3 30.0 - 36.0 g/dL   RDW 13.7 11.5 - 15.5 %   Platelets 276 150 - 400 K/uL   nRBC 0.0 0.0 - 0.2 %   Neutrophils Relative % 62 %   Neutro Abs 2.4 1.7 - 7.7 K/uL   Lymphocytes Relative 19 %   Lymphs Abs 0.8 0.7 - 4.0 K/uL   Monocytes Relative 12 %   Monocytes Absolute 0.5 0.1 - 1.0 K/uL   Eosinophils Relative 7 %   Eosinophils Absolute 0.3 0.0 - 0.5 K/uL   Basophils Relative 0 %   Basophils Absolute 0.0 0.0 - 0.1 K/uL   Immature Granulocytes 0 %   Abs Immature Granulocytes 0.01 0.00 - 0.07 K/uL    Comment: Performed at [facility], [address] [place], [place] [address]    Medications:  Current Facility-Administered Medications  Medication Dose Route Frequency Provider Last Rate Last Admin  . albuterol (VENTOLIN HFA) 108 (90 Base) MCG/ACT inhaler 2 puff   2 puff Inhalation  Q4H PRN Isla Pence, MD      . amphetamine-dextroamphetamine (ADDERALL) tablet 15 mg  15 mg Oral TID Isla Pence, MD   15 mg at 03/05/20 1205  . atenolol (TENORMIN) tablet 25 mg  25 mg Oral BID Isla Pence, MD   25 mg at 03/05/20 0746  . atorvastatin (LIPITOR) tablet 40 mg  40 mg Oral Daily Isla Pence, MD   40 mg at 03/05/20 0742  . clopidogrel (PLAVIX) tablet 75 mg  75 mg Oral Daily Isla Pence, MD   75 mg at 03/05/20 0740  . cyanocobalamin ((VITAMIN B-12)) injection 1,000 mcg  1,000 mcg Intramuscular Q30 days Susy Frizzle, MD   1,000 mcg at 07/25/19 1041  . diazepam (VALIUM) tablet 4 mg  4 mg Oral Daily Isla Pence, MD   4 mg at 03/05/20 0742  . ferrous sulfate tablet 325 mg  325 mg Oral Q breakfast Isla Pence, MD   325 mg at 03/05/20 0745  . gabapentin (NEURONTIN) capsule 600 mg  600 mg Oral TID Isla Pence, MD   600 mg at 03/05/20 0742  . hydrocortisone (ANUSOL-HC) 2.5 % rectal cream 1 application  1 application Rectal QHS Isla Pence, MD      . multivitamin with minerals tablet 1 tablet  1 tablet Oral Daily Isla Pence, MD   1 tablet at 03/05/20 0741  . ondansetron (ZOFRAN-ODT) disintegrating tablet 4 mg  4 mg Oral Q8H PRN Isla Pence, MD      . pantoprazole (PROTONIX) EC tablet 40 mg  40 mg Oral Daily Isla Pence, MD   40 mg at 03/05/20 0746  . promethazine (PHENERGAN) tablet 25 mg  25 mg Oral Q6H PRN Isla Pence, MD      . QUEtiapine (SEROQUEL) tablet 600 mg  600 mg Oral QHS Isla Pence, MD   600 mg at 03/04/20 2133  . topiramate (TOPAMAX) tablet 100 mg  100 mg Oral QHS Isla Pence, MD   100 mg at 03/04/20 2135  . traMADol (ULTRAM) tablet 50 mg  50 mg Oral q AM Isla Pence, MD   50 mg at 03/05/20 [id number]   Current Outpatient Medications  Medication Sig Dispense Refill  . albuterol (VENTOLIN HFA) 108 (90 Base) MCG/ACT inhaler INHALE 2 PUFFS INTO THE LUNGS EVERY 4 (FOUR) HOURS AS NEEDED FOR WHEEZING OR  SHORTNESS OF BREATH. 18 each 1  . amphetamine-dextroamphetamine (ADDERALL) 15 MG tablet Take 15 mg by mouth 3 (three) times daily.     Marland Kitchen atenolol (TENORMIN) 25 MG tablet Take 25 mg by mouth 2 (two) times daily.     Marland Kitchen atorvastatin (LIPITOR) 40 MG tablet TAKE 1 TABLET BY MOUTH EVERY DAY (Patient taking differently: Take 40 mg by mouth daily.) 90 tablet 1  . clopidogrel (PLAVIX) 75 MG tablet Take 75 mg by mouth daily.    . diazepam (VALIUM) 2 MG tablet Take 2 mg by mouth in the morning, at noon, in the evening, and at bedtime.    . ferrous sulfate 325 (65 FE) MG tablet Take 325 mg by mouth daily with breakfast.    . gabapentin (NEURONTIN) 600 MG tablet Take 600 mg by mouth 3 (three) times daily.    . hydrocortisone (ANUSOL-HC) 2.5 % rectal cream Place 1 application rectally at bedtime. 30 g 1  . Multiple Vitamins-Minerals (MULTIVITAMIN WITH MINERALS) tablet Take 1 tablet by mouth daily.     . ondansetron (ZOFRAN ODT) 4 MG disintegrating tablet Take 1 tablet (4 mg total) by mouth every  8 (eight) hours as needed for nausea or vomiting. 20 tablet 0  . oxyCODONE-acetaminophen (PERCOCET) 7.5-325 MG tablet Take 1 tablet by mouth 3 (three) times daily as needed for moderate pain.    . pantoprazole (PROTONIX) 40 MG tablet Take 1 tablet (40 mg total) by mouth daily. 30 tablet 11  . QUEtiapine (SEROQUEL) 300 MG tablet Take 600 mg by mouth at bedtime.     . topiramate (TOPAMAX) 100 MG tablet Take 100 mg by mouth at bedtime.     . valACYclovir (VALTREX) 500 MG tablet TAKE 1 TABLET (500 MG TOTAL) BY MOUTH DAILY AS NEEDED (FEVER BLISTER). 90 tablet 2    Psychiatric Specialty Exam: Physical Exam  Review of Systems  Blood pressure 130/88, pulse 87, temperature 98.4 F (36.9 C), resp. rate 14, height 5\' 6"  (1.676 m), weight 107 kg, last menstrual period 02/20/2020, SpO2 96 %.Body mass index is 38.09 kg/m.  General Appearance: Casual  Eye Contact:  Good  Speech:  Normal Rate  Volume:  Increased  Mood:   Irritable  Affect:  Congruent  Thought Process:  Coherent and Goal Directed  Orientation:  Full (Time, Place, and Person)  Thought Content:  Logical  Suicidal Thoughts:  No  Homicidal Thoughts:  No  Memory:  Immediate;   Fair Recent;   Fair Remote;   Fair  Judgement:  Fair  Insight:  Fair  Psychomotor Activity:  Normal  Concentration:  Concentration: Fair and Attention Span: Fair  Recall:  [employer] of Knowledge:  Fair  Language:  Good  Akathisia:  No  Handed:  Right  AIMS (if indicated):     Assets:  Communication Skills Desire for Improvement Financial Resources/Insurance Housing Leisure Time Resilience  ADL's:  Intact  Cognition:  WNL  Sleep:       Disposition: Patient denies SI/HI/AVH and shows no evidence of acute risk of harm to self or others. I have concern patient might be overtaking medications and reinforced with patient the importance of only taking medications as prescribed. Patient expresses understanding. She is psych cleared for discharge to continue outpatient follow-up. CSW and ED staff updated.  This service was provided via telemedicine using a 2-way, interactive audio and video technology with the identified patient and this Probation officer.   Connye Burkitt, NP 03/05/2020 2:14 PM

## 2020-03-05 NOTE — ED Notes (Signed)
Pt necklace placed in the pt vera bradley pocketbook. Pt made aware.

## 2020-03-05 NOTE — ED Notes (Signed)
Pt asleep, will obtain vitals when pt is awake.

## 2020-03-05 NOTE — ED Notes (Signed)
BH called, pt is discharged home.  IVC papers rescinded.

## 2020-03-05 NOTE — ED Notes (Signed)
Pt given personal belongings.

## 2020-03-06 ENCOUNTER — Emergency Department (HOSPITAL_COMMUNITY): Payer: Medicare Other

## 2020-03-06 ENCOUNTER — Encounter (HOSPITAL_COMMUNITY): Payer: Self-pay | Admitting: Emergency Medicine

## 2020-03-06 ENCOUNTER — Inpatient Hospital Stay: Payer: Medicare Other | Attending: Hematology and Oncology

## 2020-03-06 ENCOUNTER — Other Ambulatory Visit: Payer: Self-pay

## 2020-03-06 ENCOUNTER — Ambulatory Visit (HOSPITAL_COMMUNITY)
Admission: EM | Admit: 2020-03-06 | Discharge: 2020-03-06 | Disposition: A | Discharge status: Other Acute Inpatient Hospital | Payer: Medicare Other | Attending: Psychiatry | Admitting: Psychiatry

## 2020-03-06 ENCOUNTER — Ambulatory Visit: Payer: Medicare Other | Admitting: Hematology and Oncology

## 2020-03-06 ENCOUNTER — Emergency Department (HOSPITAL_COMMUNITY)
Admission: EM | Admit: 2020-03-06 | Discharge: 2020-03-08 | Payer: Medicare Other | Attending: Emergency Medicine | Admitting: Emergency Medicine

## 2020-03-06 DIAGNOSIS — R404 Transient alteration of awareness: Secondary | ICD-10-CM | POA: Diagnosis not present

## 2020-03-06 DIAGNOSIS — F314 Bipolar disorder, current episode depressed, severe, without psychotic features: Secondary | ICD-10-CM | POA: Insufficient documentation

## 2020-03-06 DIAGNOSIS — Z79899 Other long term (current) drug therapy: Secondary | ICD-10-CM | POA: Insufficient documentation

## 2020-03-06 DIAGNOSIS — I10 Essential (primary) hypertension: Secondary | ICD-10-CM | POA: Insufficient documentation

## 2020-03-06 DIAGNOSIS — D869 Sarcoidosis, unspecified: Secondary | ICD-10-CM | POA: Diagnosis present

## 2020-03-06 DIAGNOSIS — R52 Pain, unspecified: Secondary | ICD-10-CM | POA: Diagnosis present

## 2020-03-06 DIAGNOSIS — T50911A Poisoning by multiple unspecified drugs, medicaments and biological substances, accidental (unintentional), initial encounter: Secondary | ICD-10-CM | POA: Diagnosis present

## 2020-03-06 DIAGNOSIS — U071 COVID-19: Secondary | ICD-10-CM | POA: Diagnosis not present

## 2020-03-06 DIAGNOSIS — R400 Somnolence: Secondary | ICD-10-CM | POA: Diagnosis not present

## 2020-03-06 DIAGNOSIS — I959 Hypotension, unspecified: Secondary | ICD-10-CM | POA: Diagnosis not present

## 2020-03-06 DIAGNOSIS — T402X1A Poisoning by other opioids, accidental (unintentional), initial encounter: Secondary | ICD-10-CM | POA: Diagnosis not present

## 2020-03-06 DIAGNOSIS — R062 Wheezing: Secondary | ICD-10-CM | POA: Diagnosis not present

## 2020-03-06 DIAGNOSIS — T50901A Poisoning by unspecified drugs, medicaments and biological substances, accidental (unintentional), initial encounter: Secondary | ICD-10-CM | POA: Diagnosis not present

## 2020-03-06 DIAGNOSIS — Z87891 Personal history of nicotine dependence: Secondary | ICD-10-CM | POA: Insufficient documentation

## 2020-03-06 DIAGNOSIS — R4182 Altered mental status, unspecified: Secondary | ICD-10-CM | POA: Diagnosis not present

## 2020-03-06 DIAGNOSIS — F319 Bipolar disorder, unspecified: Secondary | ICD-10-CM | POA: Diagnosis present

## 2020-03-06 DIAGNOSIS — T43501A Poisoning by unspecified antipsychotics and neuroleptics, accidental (unintentional), initial encounter: Secondary | ICD-10-CM | POA: Diagnosis not present

## 2020-03-06 DIAGNOSIS — T424X1A Poisoning by benzodiazepines, accidental (unintentional), initial encounter: Secondary | ICD-10-CM | POA: Diagnosis not present

## 2020-03-06 LAB — CBC WITH DIFFERENTIAL/PLATELET
Abs Immature Granulocytes: 0.02 10*3/uL (ref 0.00–0.07)
Basophils Absolute: 0 10*3/uL (ref 0.0–0.1)
Basophils Relative: 0 %
Eosinophils Absolute: 0.3 10*3/uL (ref 0.0–0.5)
Eosinophils Relative: 4 %
HCT: 38.2 % (ref 36.0–46.0)
Hemoglobin: 11.8 g/dL — ABNORMAL LOW (ref 12.0–15.0)
Immature Granulocytes: 0 %
Lymphocytes Relative: 5 %
Lymphs Abs: 0.4 10*3/uL — ABNORMAL LOW (ref 0.7–4.0)
MCH: 28.9 pg (ref 26.0–34.0)
MCHC: 30.9 g/dL (ref 30.0–36.0)
MCV: 93.4 fL (ref 80.0–100.0)
Monocytes Absolute: 0.7 10*3/uL (ref 0.1–1.0)
Monocytes Relative: 9 %
Neutro Abs: 6.2 10*3/uL (ref 1.7–7.7)
Neutrophils Relative %: 82 %
Platelets: 273 10*3/uL (ref 150–400)
RBC: 4.09 MIL/uL (ref 3.87–5.11)
RDW: 13.6 % (ref 11.5–15.5)
WBC: 7.5 10*3/uL (ref 4.0–10.5)
nRBC: 0 % (ref 0.0–0.2)

## 2020-03-06 LAB — I-STAT VENOUS BLOOD GAS, ED
Acid-base deficit: 2 mmol/L (ref 0.0–2.0)
Bicarbonate: 25 mmol/L (ref 20.0–28.0)
Calcium, Ion: 1.12 mmol/L — ABNORMAL LOW (ref 1.15–1.40)
HCT: 37 % (ref 36.0–46.0)
Hemoglobin: 12.6 g/dL (ref 12.0–15.0)
O2 Saturation: 81 %
Potassium: 3.3 mmol/L — ABNORMAL LOW (ref 3.5–5.1)
Sodium: 138 mmol/L (ref 135–145)
TCO2: 26 mmol/L (ref 22–32)
pCO2, Ven: 49.1 mmHg (ref 44.0–60.0)
pH, Ven: 7.315 (ref 7.250–7.430)
pO2, Ven: 49 mmHg — ABNORMAL HIGH (ref 32.0–45.0)

## 2020-03-06 LAB — COMPREHENSIVE METABOLIC PANEL
ALT: 27 U/L (ref 0–44)
AST: 25 U/L (ref 15–41)
Albumin: 3.6 g/dL (ref 3.5–5.0)
Alkaline Phosphatase: 80 U/L (ref 38–126)
Anion gap: 12 (ref 5–15)
BUN: 10 mg/dL (ref 6–20)
CO2: 22 mmol/L (ref 22–32)
Calcium: 8.9 mg/dL (ref 8.9–10.3)
Chloride: 103 mmol/L (ref 98–111)
Creatinine, Ser: 0.83 mg/dL (ref 0.44–1.00)
GFR, Estimated: >60 mL/min (ref >60)
Glucose, Bld: 109 mg/dL — ABNORMAL HIGH (ref 70–99)
Potassium: 3.3 mmol/L — ABNORMAL LOW (ref 3.5–5.1)
Sodium: 137 mmol/L (ref 135–145)
Total Bilirubin: 0.5 mg/dL (ref 0.3–1.2)
Total Protein: 6.4 g/dL — ABNORMAL LOW (ref 6.5–8.1)

## 2020-03-06 LAB — AMMONIA: Ammonia: 22 umol/L (ref 9–35)

## 2020-03-06 LAB — SALICYLATE LEVEL: Salicylate Lvl: <7 mg/dL — ABNORMAL LOW (ref 7.0–30.0)

## 2020-03-06 LAB — ACETAMINOPHEN LEVEL: Acetaminophen (Tylenol), Serum: 25 ug/mL (ref 10–30)

## 2020-03-06 LAB — SARS CORONAVIRUS 2 (TAT 6-24 HRS): SARS Coronavirus 2: POSITIVE — AB

## 2020-03-06 LAB — ETHANOL: Alcohol, Ethyl (B): <10 mg/dL (ref <10)

## 2020-03-06 LAB — I-STAT BETA HCG BLOOD, ED (MC, WL, AP ONLY): I-stat hCG, quantitative: <5 m[IU]/mL (ref <5)

## 2020-03-06 MED ORDER — CLINDAMYCIN PHOSPHATE 600 MG/50ML IV SOLN
600.0000 mg | Freq: Once | INTRAVENOUS | Status: AC
  Administered 2020-03-06: 600 mg via INTRAVENOUS
  Filled 2020-03-06: qty 50

## 2020-03-06 MED ORDER — SODIUM CHLORIDE 0.9 % IV BOLUS
1000.0000 mL | Freq: Once | INTRAVENOUS | Status: AC
  Administered 2020-03-06: 1000 mL via INTRAVENOUS

## 2020-03-06 MED ORDER — OXYCODONE-ACETAMINOPHEN 7.5-325 MG PO TABS
1.0000 | ORAL_TABLET | ORAL | Status: AC
Start: 2020-03-06 — End: 2020-03-06
  Administered 2020-03-06: 1 via ORAL
  Filled 2020-03-06: qty 1

## 2020-03-06 MED ORDER — QUETIAPINE FUMARATE 200 MG PO TABS
200.0000 mg | ORAL_TABLET | Freq: Every day | ORAL | Status: DC
  Administered 2020-03-06 – 2020-03-07 (×2): 200 mg via ORAL
  Filled 2020-03-06 (×2): qty 1

## 2020-03-06 MED ORDER — DIAZEPAM 2 MG PO TABS
2.0000 mg | ORAL_TABLET | Freq: Once | ORAL | Status: AC
  Administered 2020-03-06: 2 mg via ORAL
  Filled 2020-03-06: qty 1

## 2020-03-06 NOTE — Progress Notes (Signed)
Received [name] at the [facility] with her mom after taking a combination of Seroquel, Valium and Codiene. Mom believes she took the pills to relieve her pain and not a suicide attempt. Dr. Joellen Jersey assessed the patient and EMS was called. She was transport without incident. Mom, [name], telephone number is [phone].

## 2020-03-06 NOTE — ED Provider Notes (Signed)
Patient signed out to me by previous provider.  Briefly this is a 45 year old female who took too many of her home medications, accidental overdose. Currently metabolizing. VSS.  Of note there was a small finding on the chest x-ray that could have been concerning for developing pneumonia, for this reason patient was treated with clindamycin for possible aspiration pneumonia however less likely at this time.   Physical Exam  BP (!) 102/49   Pulse 98   Temp 98 F (36.7 C) (Oral)   Resp 15   LMP 02/20/2020   SpO2 100%   Physical Exam  ED Course/Procedures     Procedures  MDM  Patient is complaining of acute on chronic lower abdominal pain, she missed her oxycodone dose, patient was given this dose and allowed to eat and drink.  She seems much more awake, clear speech.  She is now telling me that she believes she may have taken all of her medication earlier today in a suicide attempt.  She states that her mother withholds all of her important medications including her bipolar medication which is why she gets like this.  She is requesting to speak to a psychiatric service.  TTS consult has been placed. She is pending psychiatric evaluation and recommendation.       Lorelle Gibbs, [place] 03/13/20 [phone]

## 2020-03-06 NOTE — ED Notes (Signed)
Help get patient undressed on the monitor did ekg shown to Dr Eulis Foster patient is resting with call bell in reach

## 2020-03-06 NOTE — ED Triage Notes (Signed)
Pt here from [facility]  With an apparent OD on valium . Oxy  And Seroquel, pt wake and talkm but very sleepy , states that it was not a SI attempt that she was just in a lot of pain

## 2020-03-06 NOTE — ED Notes (Signed)
Pt telling this RN and other RNs currently in [facility] that she is suicidal and needs to go to [facility]

## 2020-03-06 NOTE — Discharge Instructions (Signed)
Patient to be transferred to [facility]

## 2020-03-06 NOTE — ED Notes (Signed)
Pt states that she ingested four 300mg  seroquel, three 7.5mg  oxycodone/tylenol, and two 2mg  diazepam in combination between 0330 and 0630, 03/06/20, after her mother withheld these medications from her

## 2020-03-06 NOTE — ED Notes (Signed)
Pt approached nurse's station, gait steady & even, NAD noted, no assistance necessary, stating that she needed a nurse in the room immediately. This RN went to room to inquire about needs; pt stated she needed primary nurse specifically. Primary RN notified, EDP & primary RN to assess.

## 2020-03-06 NOTE — ED Notes (Addendum)
RN called and spoke with poison control center. Per pt, mix of medications was ingested between 0330 and 0630 03/06/20. Was informed by poison control center staff that pt is likely out of window for mixed medications to have a new effect on pt; advised to continue closely monitoring pt and intermittently assess mental status

## 2020-03-06 NOTE — ED Notes (Addendum)
Pt states her anxiety and says that she does not need to go to [facility] and would like to go home, instead. Pt was reminded that she will be receiving TTS consult per her earlier statements about "needing to go to [facility]," and "not wanting to go home."

## 2020-03-06 NOTE — ED Provider Notes (Signed)
Behavioral Health Urgent Care Medical Screening Exam  Patient Name: [name] MRN: [id number] Date of Evaluation: 03/06/20 Chief Complaint:   Diagnosis:  Final diagnoses:  Somnolence    History of Present illness: [name] is a 45 y.o. female with a history of bipolar disorder who presented to the [facility] accompanied by mother after reportedly taking valium and oxyscodone, codeine During triage assessment, I was alerted by RN that there was concern about patient's mental status as as she was somnolent and unable to hold a conversation in the setting of possible overdose. Patient would open her eyes and groan with verbal and vigorous tactile stimulation but was unable to provide any meaningful history or engage in conversation. Upon opening her eyes she would immediately close them and snore loudly. Mother states that she does not believe it was a SA; she states that patient is in a lot of pain and believes that she was attempting to relieve her pain. Mother states that she is unsure if she also took seroquel. Mother states that she was discharged from the [facility] yesterday (c/w chart review) after she had an IVC filed because there was concern she was trying to kill herself by laying in the road. Patient was evaluated by psychiatry; at that time patient denied SI and expressed frustration at her mother attempting to limit her pain medications. Mother states that this morning patient was "hollering" with her door locked and said she felt like she was having a seizure. To mother's knowledge, pt has no history of seizures. A family member went to go check on her and there was concern she overdosed on her medications.  Patient with AMS in the setting of possible OD; discussed with mother that patient will be transferred to [facility] as she will likely need medical work up that is beyond the capabilities of the [facility]. Mother verbalized understanding  Psychiatric Specialty Exam  Presentation  General  Appearance:Disheveled  Eye Contact:None  Speech:Other (comment) (patient somnolent)  Speech Volume:No data recorded Handedness:No data recorded  Mood and Affect  Mood:-- (UTA)  Affect:-- (UTA)   Thought Process  Thought Processes:-- (UTA)  Descriptions of Associations:-- (UTA)  Orientation:Other (comment) (UTA)  Thought Content:-- (UTA)  Hallucinations:-- (UTA)  Ideas of Reference:-- (UTA)  Suicidal Thoughts:-- (UTA)  Homicidal Thoughts:-- (UTA)   Sensorium  Memory:Other (comment) (UTA)  Judgment:Other (comment) (UTA)  Insight:Other (comment) (UTA)   Executive Functions  Concentration:Poor  Attention Span:Poor  Recall:Poor  [employer] of Knowledge:Poor  Language:Poor   Psychomotor Activity  Psychomotor Activity:Decreased   Assets  Assets:-- (UTA)   Sleep  Sleep:-- (UTA)  Number of hours: No data recorded  Physical Exam: Physical Exam Constitutional:      Appearance: She is obese.     Comments: Somnolent, sitting  in a chair   HENT:     Head: Normocephalic and atraumatic.    Review of Systems  Unable to perform ROS: Mental status change   Blood pressure 105/73, pulse 96, temperature (!) 97.3 F (36.3 C), temperature source Tympanic, last menstrual period 02/20/2020, SpO2 96 %. There is no height or weight on file to calculate BMI.  Musculoskeletal: Strength & Muscle Tone: UTA; laying in chair somnolent Gait & Station: UTA Patient leans: [place] [facility] Discharge Disposition for Follow up and Recommendations: Based on my evaluation the patient appears to have an emergency medical condition for which I recommend the patient be transferred to the emergency department for further evaluation.   Patient with  AMS in the setting of possible overdose-unable to do proper work up at the [facility]; patient to be transferred to [facility] ED. Discussed with Dr. Eulis Foster and EMS was called by nursing staff.   Ival Bible, MD 03/06/2020, 3:08 PM

## 2020-03-06 NOTE — ED Notes (Signed)
Pt screaming "nurse" within room, refusing to use call light, for assistance with making long-distance call. Pt then found walking out of room to use the bathroom. This RN reminded pt that there is a bedside commode in her room.

## 2020-03-06 NOTE — ED Provider Notes (Signed)
[place] EMERGENCY DEPARTMENT Provider Note   CSN: [id number] Arrival date & time: 03/06/20  1430     History No chief complaint on file.   [name] is a 45 y.o. female.  HPI Patient was evaluated at the behavioral health urgent care center, for possible overdose, felt to be unstable so sent to the ED for evaluation.  She was in the ED, 2 days ago, at that time was under involuntary commitment.  She was referred to TTS who evaluated her.  The patient was felt to not be a risk to herself, her IVC was rescinded and she was discharged.  Today the patient overdosed on multiple medications, but said she was just taking it to help her pain.  This was reported to me, by a psychiatrist who saw her today at the behavioral health urgent care.  We had a telephone discussion about this patient.  Level 5 caveat-altered mental status    Past Medical History:  Diagnosis Date  . ADHD   . Anal fistula   . Anxiety   . Arthritis    pt denies 5/3 visit   . Bipolar affective ([place])   . Blood transfusion without reported diagnosis   . Colostomy in place [facility])   . CVA (cerebrovascular accident) ([place])    left cerebellar infarct found accidentally on CT (2020)   . Depression   . Diverticulitis 2008   perforated/ requiring resection  . Fatty liver   . Gallstones   . GERD (gastroesophageal reflux disease)    occ.  Marland Kitchen Headache    after a fall  . Herpes simplex   . History of colon polyps   . History of hiatal hernia    small  . History of kidney stones    cystoscopy  basket removal  . Hypertension   . Iron deficiency anemia   . Obese   . OCD (obsessive compulsive disorder)   . Panic attacks   . Pulmonary nodules   . Sarcoidosis   . Schizophrenia ([place])   . Shortness of breath dyspnea    pt denies on 5/3 visit   . Sleep apnea    mild, does not use c-pap machine  . Substance abuse ([place])    12 years ago-crack cocaine    Patient Active Problem List    Diagnosis Date Noted  . Iron deficiency anemia due to chronic blood loss 11/09/2019  . Sarcoidosis 04/26/2019  . Abnormal CT of the chest 03/23/2019  . Dyspnea 03/23/2019  . Hypokalemia 02/24/2019  . Sepsis ([place]) 02/24/2019  . Cerebellar infarct ([place]) 11/16/2018  . CVA (cerebrovascular accident) ([place])   . Degeneration of lumbar or lumbosacral intervertebral disc 12/13/2014  . Anemia associated with nutritional deficiency 11/12/2014  . Bipolar affective disorder ([place]) 09/28/2014  . Anxiety state 09/28/2014  . S/P laparoscopic sleeve gastrectomy 01/2014 09/28/2014  . Abdominal wall seroma - chronic   . Seroma   . Cholelithiases 04/25/2013  . Cough 09/19/2011  . Smoker 09/19/2011  . GERD 03/04/2010  . RECTAL BLEEDING 03/04/2010  . DYSPHAGIA UNSPECIFIED 03/04/2010  . DYSPHAGIA 03/04/2010  . RUQ PAIN 03/04/2010    Past Surgical History:  Procedure Laterality Date  . Abdominal wall hernia  07/2007   open repair with lysis of adhesions  . APPLICATION OF WOUND VAC N/A 06/29/2019   Procedure: PLACEMENT OF WOUND VAC;  Surgeon: Greer Pickerel, MD;  Location: [facility];  Service: General;  Laterality: N/A;  . BRONCHIAL BRUSHINGS  04/11/2019  Procedure: BRONCHIAL BRUSHINGS;  Surgeon: Collene Gobble, MD;  Location: [facility] ENDOSCOPY;  Service: Pulmonary;;  . BRONCHIAL WASHINGS  04/11/2019   Procedure: BRONCHIAL WASHINGS;  Surgeon: Collene Gobble, MD;  Location: [facility] ENDOSCOPY;  Service: Pulmonary;;  . COLONOSCOPY  03/13/2010   [id number] hemorrhoids likely cause of hematochezia, otherwise normal  . COLONOSCOPY  05/24/2019  . COLONOSCOPY WITH ESOPHAGOGASTRODUODENOSCOPY (EGD)  04/2019  . Colostomy reversal  11/2006  . DEBRIDEMENT OF ABDOMINAL WALL ABSCESS N/A 06/29/2019   Procedure: EXCISIONAL DEBRIDEMENT OF ABDOMINAL WALL/SUBCU SEROMA;  Surgeon: Greer Pickerel, MD;  Location: [facility];  Service: General;  Laterality: N/A;  . ESOPHAGOGASTRODUODENOSCOPY  03/13/2010   [id number] esophagus, status  post passage of a Maloney dilator/Small hiatal hernia/ Antral erosions, status post biopsy  . EXAMINATION UNDER ANESTHESIA N/A 05/11/2012   Procedure: EXAM UNDER ANESTHESIA;  Surgeon: Donato Heinz, MD;  Location: [facility];  Service: General;  Laterality: N/A;  . Exploratory laparotomy with resection  2008   colonoscopy  . FINE NEEDLE ASPIRATION  04/11/2019   Procedure: FINE NEEDLE ASPIRATION (FNA) LINEAR;  Surgeon: Collene Gobble, MD;  Location: [facility] ENDOSCOPY;  Service: Pulmonary;;  . FINGER CLOSED REDUCTION Right 08/05/2012   Procedure: CLOSED REDUCTION RIGHT THUMB (FINGER);  Surgeon: Linna Hoff, MD;  Location: [place];  Service: Orthopedics;  Laterality: Right;  . HERNIA REPAIR     incisional hernia surgery   . Hx of abd wall seroma  08/2007   Drained via [place] in [place], [place]  . Hx of abd wall seroma  10/2007   Drained by Dr. Geroge Baseman in office  . INCISION AND DRAINAGE ABSCESS N/A 06/29/2019   Procedure: INCISION AND DRAINAGE;  Surgeon: Greer Pickerel, MD;  [facility];  Service: General;  Laterality: N/A;  . IR RADIOLOGIST EVAL & MGMT  03/15/2019  . IR RADIOLOGIST EVAL & MGMT  04/06/2019  . Kidney stones    . LAPAROSCOPIC GASTRIC SLEEVE RESECTION N/A 02/13/2014   Procedure: LAPAROSCOPIC GASTRIC SLEEVE RESECTION LYSIS OF ADHESIONS, UPPER ENDOSCOPY;  Surgeon: [name], MD;  [facility];  Service: General;  Laterality: N/A;  . LAPAROSCOPY N/A 09/28/2014   Procedure: LAPAROSCOPY DIAGNOSTIC, INCISION AND DRAINAGE WITH LAPAROSCOPIC EXPLORATION OF ABDOMINAL WALL SEROMA with ultrasound;  Surgeon: Greer Pickerel, MD;  [facility];  Service: General;  Laterality: N/A;  . LUNG BIOPSY  04/11/2019   Procedure: LUNG BIOPSY;  Surgeon: Collene Gobble, MD;  Location[facility] ENDOSCOPY;  Service: Pulmonary;;  distal trachea  . PLACEMENT OF SETON N/A 05/11/2012   Procedure: PLACEMENT OF SETON;  Surgeon: Donato Heinz, MD;  Location: [facility];  Service: General;  Laterality: N/A;  . root canal 7-16    .  TONSILLECTOMY    . TREATMENT FISTULA ANAL     x 2  . UPPER GASTROINTESTINAL ENDOSCOPY  05/24/2019  . VIDEO BRONCHOSCOPY WITH ENDOBRONCHIAL ULTRASOUND N/A 04/11/2019   Procedure: VIDEO BRONCHOSCOPY WITH ENDOBRONCHIAL ULTRASOUND;  Surgeon: Collene Gobble, MD;  Location: [facility] ENDOSCOPY;  Service: Pulmonary;  Laterality: N/A;     OB History   No obstetric history on file.     Family History  Problem Relation Age of Onset  . Asthma Maternal Grandmother   . Colon polyps Maternal Grandmother   . Colon cancer Maternal Grandmother   . Esophageal cancer Neg Hx   . Rectal cancer Neg Hx   . Stomach cancer Neg Hx   . Pancreatic cancer Neg Hx   . Liver disease Neg  Hx     Social History   Tobacco Use  . Smoking status: Former Smoker    Packs/day: 0.50    Years: 14.00    Pack years: 7.00    Types: E-cigarettes    Quit date: 12/20/2013    Years since quitting: 6.2  . Smokeless tobacco: Never Used  . Tobacco comment: Vapor cigarettes.  Vaping Use  . Vaping Use: Former  . Quit date: 02/24/2019  . Substances: Nicotine, Flavoring  . Devices: started 2015  Substance Use Topics  . Alcohol use: No    Alcohol/week: 0.0 standard drinks  . Drug use: Never    Home Medications Prior to Admission medications   Medication Sig Start Date End Date Taking? Authorizing Provider  albuterol (VENTOLIN HFA) 108 (90 Base) MCG/ACT inhaler INHALE 2 PUFFS INTO THE LUNGS EVERY 4 (FOUR) HOURS AS NEEDED FOR WHEEZING OR SHORTNESS OF BREATH. 01/02/20   Susy Frizzle, MD  amphetamine-dextroamphetamine (ADDERALL) 15 MG tablet Take 15 mg by mouth 3 (three) times daily.  04/06/19   [provider]  atenolol (TENORMIN) 25 MG tablet Take 25 mg by mouth 2 (two) times daily.  03/21/19   [provider]  atorvastatin (LIPITOR) 40 MG tablet TAKE 1 TABLET BY MOUTH EVERY DAY Patient taking differently: Take 40 mg by mouth daily. 09/18/19   Susy Frizzle, MD  clopidogrel (PLAVIX) 75 MG tablet Take 75 mg  by mouth daily.    [provider]  diazepam (VALIUM) 2 MG tablet Take 2 mg by mouth in the morning, at noon, in the evening, and at bedtime.    [provider]  ferrous sulfate 325 (65 FE) MG tablet Take 325 mg by mouth daily with breakfast.    [provider]  gabapentin (NEURONTIN) 600 MG tablet Take 600 mg by mouth 3 (three) times daily.    [provider]  hydrocortisone (ANUSOL-HC) 2.5 % rectal cream Place 1 application rectally at bedtime. 01/25/20   Willia Craze, NP  Multiple Vitamins-Minerals (MULTIVITAMIN WITH MINERALS) tablet Take 1 tablet by mouth daily.     [provider]  ondansetron (ZOFRAN ODT) 4 MG disintegrating tablet Take 1 tablet (4 mg total) by mouth every 8 (eight) hours as needed for nausea or vomiting. 02/06/19   Susy Frizzle, MD  oxyCODONE-acetaminophen (PERCOCET) 7.5-325 MG tablet Take 1 tablet by mouth 3 (three) times daily as needed for moderate pain. 02/29/20   [provider]  pantoprazole (PROTONIX) 40 MG tablet Take 1 tablet (40 mg total) by mouth daily. 05/24/19   Ladene Artist, MD  QUEtiapine (SEROQUEL) 300 MG tablet Take 600 mg by mouth at bedtime.  11/21/14   [provider]  topiramate (TOPAMAX) 100 MG tablet Take 100 mg by mouth at bedtime.  12/31/18   [provider]  valACYclovir (VALTREX) 500 MG tablet TAKE 1 TABLET (500 MG TOTAL) BY MOUTH DAILY AS NEEDED (FEVER BLISTER). 09/14/19   Susy Frizzle, MD    Allergies    Patient has no known allergies.  Review of Systems   Review of Systems  Unable to perform ROS: Mental status change    Physical Exam Updated Vital Signs BP (!) 102/49   Pulse 98   Temp 98 F (36.7 C) (Oral)   Resp 15   LMP 02/20/2020   SpO2 100%   Physical Exam Vitals and nursing note reviewed.  Constitutional:      General: She is in acute distress.  Appearance: She is well-developed and well-nourished. She is ill-appearing. She is not  toxic-appearing or diaphoretic.  HENT:     Head: Normocephalic and atraumatic.     Right Ear: External ear normal.     Left Ear: External ear normal.     Mouth/Throat:     Pharynx: No oropharyngeal exudate or posterior oropharyngeal erythema.  Eyes:     Extraocular Movements: EOM normal.     Conjunctiva/sclera: Conjunctivae normal.     Pupils: Pupils are equal, round, and reactive to light.  Neck:     Trachea: Phonation normal.  Cardiovascular:     Rate and Rhythm: Normal rate and regular rhythm.     Heart sounds: Normal heart sounds.     Comments: Hypotensive Pulmonary:     Effort: Pulmonary effort is normal. No respiratory distress.     Breath sounds: Normal breath sounds. No stridor.  Chest:     Chest wall: No bony tenderness.  Abdominal:     General: There is no distension.     Palpations: Abdomen is soft.     Tenderness: There is no abdominal tenderness.  Musculoskeletal:        General: No swelling, tenderness or signs of injury.     Cervical back: Normal range of motion and neck supple.  Skin:    General: Skin is warm, dry and intact.  Neurological:     Mental Status: She is alert.     Cranial Nerves: No cranial nerve deficit.     Motor: No abnormal muscle tone.     Comments: Occasionally communicative and then falls asleep while talking.  Able to specify some things but is a poor historian.  No dysarthria or aphasia.  No abnormal muscle tone.  Psychiatric:        Mood and Affect: Mood and affect normal.     Comments: Obtunded     ED Results / Procedures / Treatments   Labs (all labs ordered are listed, but only abnormal results are displayed) Labs Reviewed  COMPREHENSIVE METABOLIC PANEL - Abnormal; Notable for the following components:      Result Value   Potassium 3.3 (*)    Glucose, Bld 109 (*)    Total Protein 6.4 (*)    All other components within normal limits  CBC WITH DIFFERENTIAL/PLATELET - Abnormal; Notable for the following components:    Hemoglobin 11.8 (*)    Lymphs Abs 0.4 (*)    All other components within normal limits  SALICYLATE LEVEL - Abnormal; Notable for the following components:   Salicylate Lvl [id number] (*)    All other components within normal limits  I-STAT VENOUS BLOOD GAS, ED - Abnormal; Notable for the following components:   pO2, Ven 49.0 (*)    Potassium 3.3 (*)    Calcium, Ion 1.12 (*)    All other components within normal limits  SARS CORONAVIRUS 2 (TAT 6-24 HRS)  ACETAMINOPHEN LEVEL  ETHANOL  AMMONIA  RAPID URINE DRUG SCREEN, HOSP PERFORMED  URINALYSIS, ROUTINE W REFLEX MICROSCOPIC  I-STAT BETA HCG BLOOD, ED ([facility], [facility], [facility] ONLY)    EKG EKG Interpretation  Date/Time:  Wednesday March 06 2020 14:32:21 EST Ventricular Rate:  90 PR Interval:    QRS Duration: 97 QT Interval:  356 QTC Calculation: 436 R Axis:   82 Text Interpretation: Sinus rhythm Borderline T abnormalities, anterior leads Since last tracing rate slower Otherwise no significant change Confirmed by Daleen Bo [phone]) on 03/06/2020 5:19:30 PM   Radiology DG Chest  Port 1 View  Result Date: 03/06/2020 CLINICAL DATA:  Wheezing following drug overdose EXAM: PORTABLE CHEST 1 VIEW COMPARISON:  March 03, 2020 chest radiograph and chest CT January 15, 2020 FINDINGS: There is again noted hilar and mediastinal adenopathy. There is subtle ill-defined opacity in the right base. Lungs otherwise clear. Heart size normal. Pulmonary vascularity grossly normal. No bone lesions. IMPRESSION: Persistent multifocal adenopathy of uncertain etiology. Question sarcoidosis. Neoplastic cause of adenopathy not excluded on this study. Small focus of airspace opacity right base concerning for developing pneumonia. No other similar areas of airspace opacity. Heart size normal. Electronically Signed   By: Lowella Grip III M.D.   On: 03/06/2020 15:25    Procedures .Critical Care Performed by: Daleen Bo, MD Authorized by: Daleen Bo, MD    Critical care provider statement:    Critical care time (minutes):  50   Critical care start time:  03/06/2020 2:40 PM   Critical care end time:  03/06/2020 5:22 PM   Critical care time was exclusive of:  Separately billable procedures and treating other patients   Critical care was necessary to treat or prevent imminent or life-threatening deterioration of the following conditions:  Toxidrome   Critical care was time spent personally by me on the following activities:  Blood draw for specimens, development of treatment plan with patient or surrogate, discussions with consultants, evaluation of patient's response to treatment, examination of patient, obtaining history from patient or surrogate, ordering and performing treatments and interventions, ordering and review of laboratory studies, pulse oximetry, re-evaluation of patient's condition, review of old charts and ordering and review of radiographic studies   (including critical care time)  Medications Ordered in ED Medications  clindamycin (CLEOCIN) IVPB 600 mg (has no administration in time range)  sodium chloride 0.9 % bolus 1,000 mL (0 mLs Intravenous Stopped 03/06/20 1541)  sodium chloride 0.9 % bolus 1,000 mL (0 mLs Intravenous Stopped 03/06/20 1647)    ED Course  I have reviewed the triage vital signs and the nursing notes.  Pertinent labs & imaging results that were available during my care of the patient were reviewed by me and considered in my medical decision making (see chart for details).    MDM Rules/Calculators/A&P                           Patient Vitals for the past 24 hrs:  BP Temp Temp src Pulse Resp SpO2  03/06/20 1715 (!) 102/49 - - 98 15 100 %  03/06/20 1700 (!) 101/37 - - 86 13 100 %  03/06/20 1645 (!) 103/50 - - 90 16 100 %  03/06/20 1615 (!) 90/47 - - 88 11 100 %  03/06/20 1545 (!) 96/51 - - 88 16 100 %  03/06/20 1542 (!) 88/48 - - 85 16 100 %  03/06/20 1515 105/87 - - 93 16 100 %  03/06/20 1500 (!)  90/53 - - 88 13 100 %  03/06/20 1436 - 98 F (36.7 C) Oral - - -  03/06/20 1435 (!) 98/55 - - 89 13 100 %  03/06/20 1430 (!) 98/55 - - 92 12 100 %    5:23 PM Reevaluation with update and discussion. After initial assessment and treatment, an updated evaluation reveals blood pressure somewhat improved after second liter of saline.  Patient continues to be altered, sleepy. Daleen Bo   Medical Decision Making:  This patient is presenting for evaluation of overdose on multiple medications,  which does require a range of treatment options, and is a complaint that involves a high risk of morbidity and mortality. The differential diagnoses include toxic ingestion, acute intoxication, acute illness. I decided to review old records, and in summary patient reportedly overdosed on Valium, oxycodone and Seroquel.  This is reported to be a nonsuicidal attempt.  I obtained additional historical information from Dr. Serafina Mitchell, psychiatry who saw the patient earlier today.  She apparently communicated with the patient's mother as well.  Clinical Laboratory Tests Ordered, included CBC, Metabolic panel, Urinalysis and Pregnancy test, COVID test, aspirin and Tylenol levels, alcohol level, ammonia level. Review indicates VBG normal, Tylenol and salicylate level normal, potassium low, glucose high.  Ammonia and alcohol normal.  Hemoglobin low.  Pregnancy negative. Radiologic Tests Ordered, included chest x-ray.  I independently Visualized: Radiographic images, which show possible aspiration pneumonia.  Clindamycin ordered.  Cardiac Monitor Tracing which shows normal sinus rhythm  Critical Interventions-clinical evaluation, laboratory testing, IV fluids, observation reassessment  After These Interventions, the Patient was reevaluated and was found to require assessment and treatment for altered mental status with apparent overdose.  The overdose was reportedly accidental, taking medications to control pain.   Patient unable to give history.  CRITICAL CARE-yes Performed by: Daleen Bo  Nursing Notes Reviewed/ Care Coordinated Applicable Imaging Reviewed Interpretation of Laboratory Data incorporated into ED treatment   Plan observation by oncoming care team with consideration for disposition when she becomes more alert.    Final Clinical Impression(s) / ED Diagnoses Final diagnoses:  Accidental drug overdose, initial encounter    Rx / DC Orders ED Discharge Orders    None       Daleen Bo, MD 03/06/20 1723

## 2020-03-07 ENCOUNTER — Ambulatory Visit (HOSPITAL_COMMUNITY): Payer: Medicare Other

## 2020-03-07 DIAGNOSIS — U071 COVID-19: Secondary | ICD-10-CM | POA: Diagnosis not present

## 2020-03-07 DIAGNOSIS — T424X1A Poisoning by benzodiazepines, accidental (unintentional), initial encounter: Secondary | ICD-10-CM | POA: Diagnosis not present

## 2020-03-07 DIAGNOSIS — T402X1A Poisoning by other opioids, accidental (unintentional), initial encounter: Secondary | ICD-10-CM | POA: Diagnosis not present

## 2020-03-07 DIAGNOSIS — T43501A Poisoning by unspecified antipsychotics and neuroleptics, accidental (unintentional), initial encounter: Secondary | ICD-10-CM | POA: Diagnosis not present

## 2020-03-07 LAB — RAPID URINE DRUG SCREEN, HOSP PERFORMED
Amphetamines: NOT DETECTED
Barbiturates: NOT DETECTED
Benzodiazepines: POSITIVE — AB
Cocaine: NOT DETECTED
Opiates: NOT DETECTED
Tetrahydrocannabinol: NOT DETECTED

## 2020-03-07 LAB — CBG MONITORING, ED: Glucose-Capillary: 72 mg/dL (ref 70–99)

## 2020-03-07 LAB — URINALYSIS, ROUTINE W REFLEX MICROSCOPIC
Bilirubin Urine: NEGATIVE
Glucose, UA: NEGATIVE mg/dL
Hgb urine dipstick: NEGATIVE
Ketones, ur: 5 mg/dL — AB
Leukocytes,Ua: NEGATIVE
Nitrite: NEGATIVE
Protein, ur: NEGATIVE mg/dL
Specific Gravity, Urine: 1.005 (ref 1.005–1.030)
pH: 7 (ref 5.0–8.0)

## 2020-03-07 MED ORDER — LORAZEPAM 1 MG PO TABS
1.0000 mg | ORAL_TABLET | Freq: Four times a day (QID) | ORAL | Status: DC | PRN
  Administered 2020-03-07 – 2020-03-08 (×5): 1 mg via ORAL
  Filled 2020-03-07 (×5): qty 1

## 2020-03-07 MED ORDER — OXYCODONE-ACETAMINOPHEN 5-325 MG PO TABS
1.0000 | ORAL_TABLET | Freq: Once | ORAL | Status: AC
Start: 2020-03-07 — End: 2020-03-07
  Administered 2020-03-07: 1 via ORAL
  Filled 2020-03-07: qty 1

## 2020-03-07 MED ORDER — ACETAMINOPHEN 325 MG PO TABS
325.0000 mg | ORAL_TABLET | Freq: Once | ORAL | Status: AC
  Administered 2020-03-07: 325 mg via ORAL
  Filled 2020-03-07: qty 1

## 2020-03-07 MED ORDER — HYDROCODONE-ACETAMINOPHEN 5-325 MG PO TABS
1.0000 | ORAL_TABLET | Freq: Once | ORAL | Status: AC
  Administered 2020-03-07: 1 via ORAL
  Filled 2020-03-07: qty 1

## 2020-03-07 NOTE — ED Notes (Addendum)
Pt belongings are in locker 7. Pt has shirt,pants,bra. Pt has no other belongings with her. Pt refusing to put on the gown. Pt reports she is hot. Pt is refusing to keep on the purple scrubs bc she is hot. I checked pt temp and it is 100.7. MD [place] notified.

## 2020-03-07 NOTE — ED Notes (Signed)
TTS in progress 

## 2020-03-07 NOTE — ED Provider Notes (Signed)
Emergency Medicine Observation Re-evaluation Note  [name] is a 45 y.o. female, seen on rounds today.  Pt initially presented to the ED for complaints of No chief complaint on file. Currently, the patient is laying in bed.  Physical Exam  BP 140/87 (BP Location: Right Arm)   Pulse 100   Temp 98.5 F (36.9 C) (Oral)   Resp 20   LMP 02/20/2020   SpO2 98%  Physical Exam General: Alert, oriented, tearful Lungs: Regular unlabored Skin: Warm dry Psych: Tearful cooperative  ED Course / MDM  EKG:EKG Interpretation  Date/Time:  Wednesday March 06 2020 14:32:21 EST Ventricular Rate:  90 PR Interval:    QRS Duration: 97 QT Interval:  356 QTC Calculation: 436 R Axis:   82 Text Interpretation: Sinus rhythm Borderline T abnormalities, anterior leads Since last tracing rate slower Otherwise no significant change Confirmed by Daleen Bo [phone]) on 03/06/2020 5:19:30 PM    I have reviewed the labs performed to date as well as medications administered while in observation.  Recent changes in the last 24 hours   Vital signs today stable, temperature 98.5 F, pulse 100 bpm, blood pressure 140/87, respirations 20, pulse ox 98%.  VBG unremarkable.  Pregnancy test negative.  COVID test positive.  Ammonia negative.  Ethanol negative.  Tylenol level 25.  Salicylate level negative.  CMP without emergent electrolyte derangement, AKI, LFT elevations or gap.  CBC shows no leukocytosis or anemia.  CXR: IMPRESSION:  Persistent multifocal adenopathy of uncertain etiology. Question  sarcoidosis. Neoplastic cause of adenopathy not excluded on this  study.    Small focus of airspace opacity right base concerning for developing  pneumonia. No other similar areas of airspace opacity. Heart size  normal.  Patient was started on clindamycin by previous provider for concern of aspiration pneumonia prior to COVID test result.  Urinalysis negative for infection  Plan  Psych team recommends  patient is recommended for inpatient treatment.  Patient was anxious earlier she has Ativan ordered as needed, this was given with improvement.  Patient's vital signs are stable she has no hypoxia or respiratory symptoms due to her COVID. At this time there does not appear to be any evidence of an acute emergency medical condition and the patient appears stable. Discussed plan of care and case with Dr. Johnney Killian who agrees.   [name] was evaluated in Emergency Department on 03/07/2020 for the symptoms described in the history of present illness. She was evaluated in the context of the global COVID-19 pandemic, which necessitated consideration that the patient might be at risk for infection with the SARS-CoV-2 virus that causes COVID-19. Institutional protocols and algorithms that pertain to the evaluation of patients at risk for COVID-19 are in a state of rapid change based on information released by regulatory bodies including the CDC and federal and state organizations. These policies and algorithms were followed during the patient's care in the ED.   Note: Portions of this report may have been transcribed using voice recognition software. Every effort was made to ensure accuracy; however, inadvertent computerized transcription errors may still be present.   Gari Crown 03/07/20 1730    Charlesetta Shanks, MD 03/08/20 [phone]

## 2020-03-07 NOTE — ED Notes (Signed)
This RN provided warm blankets, fluids and snacks and got pt reposition in bed, call bell within reach

## 2020-03-07 NOTE — ED Notes (Signed)
Pt anxious and tearful about having difficulty getting a hold of her son and mother. Attempting to call family at this time. Pt also requesting wound dressing changes to her abd from previous abd surgery. States the wound needs to be packed with wet to dry dressings 2x/day per pt. EDP [place] notified.

## 2020-03-07 NOTE — B[facility] Counselor (Signed)
Harriett Sine, PMHNP recommends in patient treatment once medically clear. Attempted to reach patient's RN to notify of disposition, however they are unavailable at this time. Will call back shortly.

## 2020-03-07 NOTE — ED Notes (Signed)
Report given to brit

## 2020-03-07 NOTE — ED Notes (Signed)
Dinner Tray Ordered @ 1733. 

## 2020-03-07 NOTE — BH Assessment (Signed)
TTS attempted to complete assessment but was unsuccessful due to [place] not working. Pt current RN Sharmon Revere) attempted to reboot the cart and still not working. TTS asked [place] to alert TTS when the cart is working.

## 2020-03-07 NOTE — ED Notes (Signed)
Pt ambulatory to restroom independently without difficulty. Pt obtained urine sample. Will continue to monitor.

## 2020-03-07 NOTE — ED Notes (Signed)
Pt refusing to stay hooked up to monitor for VS compliance. Will attempt to get updated set of vitals.

## 2020-03-07 NOTE — ED Provider Notes (Signed)
Patient currently under psychiatric hold for possible suicide attempt.  Psychiatric team has recommended patient for inpatient treatment.  Does have a chronic abdominal wound.  Takes Norco for this.  Nursing has called me the patient is requesting pain medication.  She has stable vital signs.  Home medication ordered   Nettie Elm, PA-C 03/07/20 1901    Charlesetta Shanks, MD 03/08/20 0009

## 2020-03-07 NOTE — ED Notes (Signed)
Lunch tray ordered 

## 2020-03-07 NOTE — BH Assessment (Signed)
Comprehensive Clinical Assessment (CCA) Note  03/07/2020 [name] [id number]   Patient is a 45 year old female presenting voluntarily to [facility] ED from [facility] due to concerns of overdose. Per EDP: "Today the patient overdosed on multiple medications, but said she was just taking it to help her pain.  This was reported to me, by a psychiatrist who saw her today at the behavioral health urgent care."  Upon this counselor's exam patient is calm and cooperative but is tearful, at times crying heavily. Patient states she does not know how she got to the hospital and asked where she was. Patient appeared confused when informed she was at [facility]. Patient reports she does not remember going to [facility] with her mother the previous day. Patient initially denies remembering taking an overdose but later states, "I do think I was trying to kill myself yesterday because I am in so much pain and it makes me not want to live. Now I know I have Covid on top of it." Patient was d/c from [facility] ED on 1/11 after concerns of misusing prescription medications. Patient indicates that she has manic depression, lung disease, and a wound that needs care. Patient states her psychiatrist, Dr. Erling Cruz, prescribes her psychiatric medications (Seroquel, Valium, and Adderal)  but states she doesn't think she takes them as she should and needs assistance with taking medications on a daily basis. Dr. Kirstie Mirza, her pain management doctor prescribes her 7.5 mg oxycodone but states her mother withholds these from her as she used to have an addiction problem. She states her chronic pain makes her feels suicidal. Patient denies current SI/HI/AVH. She states "I'm really sick. I need help." Patient reports only sleeping 3-4 hours per night, despite taking 600 mg of Seroquel. She also reports poor appetite, resulting in a 19 lb weight loss.  Harriett Sine, PMHNP recommends in patient treatment once medically clear.  Chief Complaint: Overdose  Visit Diagnosis:   Bipolar I, current episode depressed, severe    CCA Biopsychosocial Intake/Chief Complaint:  NA  Current Symptoms/Problems: NA   Patient Reported Schizophrenia/Schizoaffective Diagnosis in Past: No   Strengths: NA  Preferences: NA  Abilities: N/A   Type of Services Patient Feels are Needed: NA   Initial Clinical Notes/Concerns: N/A   Mental Health Symptoms Depression:  Hopelessness; Change in energy/activity; Fatigue; Increase/decrease in appetite; Irritability; Sleep (too much or little); Tearfulness; Weight gain/loss; Worthlessness; Difficulty Concentrating   Duration of Depressive symptoms: Greater than two weeks   Mania:  Recklessness; Irritability; Racing thoughts   Anxiety:   Difficulty concentrating   Psychosis:  Grossly disorganized speech; Affective flattening/alogia/avolition   Duration of Psychotic symptoms: -- (UTA)   Trauma:  None   Obsessions:  None   Compulsions:  None   Inattention:  None   Hyperactivity/Impulsivity:  N/A   Oppositional/Defiant Behaviors:  None   Emotional Irregularity:  Mood lability; Potentially harmful impulsivity   Other Mood/Personality Symptoms:  None noted    Mental Status Exam Appearance and self-care  Stature:  Average   Weight:  Overweight   Clothing:  Neat/clean (Pt is dressed in scrubs)   Grooming:  Normal   Cosmetic use:  None   Posture/gait:  Normal   Motor activity:  Slowed   Sensorium  Attention:  Normal   Concentration:  Normal   Orientation:  X5 (UTA)   Recall/memory:  Defective in Recent (UTA)   Affect and Mood  Affect:  Depressed; Full Range; Tearful   Mood:  Depressed; Hopeless  Relating  Eye contact:  Normal   Facial expression:  Responsive   Attitude toward examiner:  Cooperative   Thought and Language  Speech flow: Slow   Thought content:  Appropriate to Mood and Circumstances (Pt had difficulties answering the questions posed and did not answer several questions,  instead just sitting in silence)   Preoccupation:  None   Hallucinations:  None   Organization:  No data recorded  [employer] of Knowledge:  Average   Intelligence:  Average   Abstraction:  Normal (UTA)   Judgement:  Impaired   Reality Testing:  Variable   Insight:  Lacking   Decision Making:  Impulsive   Social Functioning  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)   Social Judgement:  Heedless (UTA)   Stress  Stressors:  Grief/losses; Illness   Coping Ability:  Exhausted   Skill Deficits:  Decision making; Self-care; Self-control   Supports:  Family; Friends/Service system     Religion: Religion/Spirituality Are You A Religious Person?: No (N/A) How Might This Affect Treatment?: N/A  Leisure/Recreation: Leisure / Recreation Do You Have Hobbies?: No (N/A)  Exercise/Diet: Exercise/Diet Do You Exercise?: No (N/A) Have You Gained or Lost A Significant Amount of Weight in the Past Six Months?: No (N/A) Do You Follow a Special Diet?: No (N/A) Do You Have Any Trouble Sleeping?: Yes (N/A) Explanation of Sleeping Difficulties: reports 3 to 4 hours even when taking 600 mg Seroquel   CCA Employment/Education Employment/Work Situation: Employment / Work Situation Employment situation: On disability Why is patient on disability: UTA How long has patient been on disability: N/A Patient's job has been impacted by current illness:  (N/A) What is the longest time patient has a held a job?: N/A Where was the patient employed at that time?: N/A Has patient ever been in the [employer]?:  (N/A)  Education: Education Last Grade Completed:  (N/A) Name of High School: N/A Did Teacher, adult education From [employer]?:  (N/A) Did You Attend College?:  (N/A) Did [place]?:  (N/A) Did You Have Any Special Interests In School?: N/A Did You Have An Individualized Education Program (IIEP):  (N/A) Did You Have Any Difficulty At School?:  (N/A)   CCA  Family/Childhood History Family and Relationship History: Family history Marital status: Single Are you sexually active?: No (N/A) What is your sexual orientation?: heterosexual Has your sexual activity been affected by drugs, alcohol, medication, or emotional stress?: N/A Does patient have children?: Yes How many children?: 1 (At last one son) How is patient's relationship with their children?: 61 year old son- states she has not seen him in a few days and misses him  Childhood History:  Childhood History By whom was/is the patient raised?: Mother (N/A) Additional childhood history information: N/A Description of patient's relationship with caregiver when they were a child: N/A Patient's description of current relationship with people who raised him/her: strained How were you disciplined when you got in trouble as a child/adolescent?: verbal Does patient have siblings?:  (N/A) Did patient suffer any verbal/emotional/physical/sexual abuse as a child?: No (UTA) Did patient suffer from severe childhood neglect?: No (\) Has patient ever been sexually abused/assaulted/raped as an adolescent or adult?: No (UTA) Was the patient ever a victim of a crime or a disaster?: No (UTA) Witnessed domestic violence?: No (UTA) Has patient been affected by domestic violence as an adult?: No (UTA)  Child/Adolescent Assessment:     CCA Substance Use Alcohol/Drug Use: Alcohol / Drug Use Pain Medications:  Please see MAR Prescriptions: Please see MAR Over the Counter: Please see MAR History of alcohol / drug use?: Yes Longest period of sobriety (when/how long): Unknown Negative Consequences of Use: Personal relationships Substance #1 Name of Substance 1: Morphine 1 - Age of First Use: Unknown 1 - Amount (size/oz): Unknown 1 - Frequency: 2x that pt acknowledged 1 - Duration: Unknown 1 - Last Use / Amount: Within the last 4 months; pt admitted to taking 2 doses of her grandmother's  morphine Substance #2 Name of Substance 2: EtOH 2 - Age of First Use: Unknown 2 - Amount (size/oz): Unknown 2 - Frequency: Unknown 2 - Duration: Unknown 2 - Last Use / Amount: Pt did not answer this quesiton (pt sat in silence after she was asked) Substance #3 Name of Substance 3: Cocaine/crack cocaine 3 - Age of First Use: Unknown 3 - Amount (size/oz): Unknown 3 - Frequency: Unknown 3 - Duration: Unknown 3 - Last Use / Amount: Pt did not answer this quesiton (pt sat in silence after she was asked) Substance #4 Name of Substance 4: Benzodiazepines 4 - Age of First Use: Unknown 4 - Amount (size/oz): Unknown 4 - Frequency: Unknown 4 - Duration: Unknown 4 - Last Use / Amount: Pt did not answer this quesiton (pt sat in silence after she was asked)                 ASAM's:  Six Dimensions of Multidimensional Assessment  Dimension 1:  Acute Intoxication and/or Withdrawal Potential:   Dimension 1:  Description of individual's past and current experiences of substance use and withdrawal:  (UTA)  Dimension 2:  Biomedical Conditions and Complications:   Dimension 2:  Description of patient's biomedical conditions and  complications:  (UTA)  Dimension 3:  Emotional, Behavioral, or Cognitive Conditions and Complications:  Dimension 3:  Description of emotional, behavioral, or cognitive conditions and complications:  (UTA)  Dimension 4:  Readiness to Change:  Dimension 4:  Description of Readiness to Change criteria:  (UTA)  Dimension 5:  Relapse, Continued use, or Continued Problem Potential:  Dimension 5:  Relapse, continued use, or continued problem potential critiera description:  (UTA)  Dimension 6:  Recovery/Living Environment:  Dimension 6:  Recovery/Iiving environment criteria description:  (UTA)  ASAM Severity Score:    ASAM Recommended Level of Treatment:     Substance use Disorder (SUD)    Recommendations for Services/Supports/Treatments:    DSM5 Diagnoses: Patient  Active Problem List   Diagnosis Date Noted  . Iron deficiency anemia due to chronic blood loss 11/09/2019  . Sarcoidosis 04/26/2019  . Abnormal CT of the chest 03/23/2019  . Dyspnea 03/23/2019  . Hypokalemia 02/24/2019  . Sepsis ([place]) 02/24/2019  . Cerebellar infarct ([place]) 11/16/2018  . CVA (cerebrovascular accident) ([place])   . Degeneration of lumbar or lumbosacral intervertebral disc 12/13/2014  . Anemia associated with nutritional deficiency 11/12/2014  . Bipolar affective disorder ([place]) 09/28/2014  . Anxiety state 09/28/2014  . S/P laparoscopic sleeve gastrectomy 01/2014 09/28/2014  . Abdominal wall seroma - chronic   . Seroma   . Cholelithiases 04/25/2013  . Cough 09/19/2011  . Smoker 09/19/2011  . GERD 03/04/2010  . RECTAL BLEEDING 03/04/2010  . DYSPHAGIA UNSPECIFIED 03/04/2010  . DYSPHAGIA 03/04/2010  . RUQ PAIN 03/04/2010    Patient Centered Plan: Patient is on the following Treatment Plan(s):     Referrals to Alternative Service(s): Referred to Alternative Service(s):   Place:   Date:   Time:  Referred to Alternative Service(s):   Place:   Date:   Time:    Referred to Alternative Service(s):   Place:   Date:   Time:    Referred to Alternative Service(s):   Place:   Date:   Time:     Orvis Brill, LCSW

## 2020-03-07 NOTE — ED Notes (Signed)
Pt given a diet coke. 

## 2020-03-08 DIAGNOSIS — T424X1A Poisoning by benzodiazepines, accidental (unintentional), initial encounter: Secondary | ICD-10-CM | POA: Diagnosis not present

## 2020-03-08 DIAGNOSIS — T50904A Poisoning by unspecified drugs, medicaments and biological substances, undetermined, initial encounter: Secondary | ICD-10-CM | POA: Diagnosis not present

## 2020-03-08 DIAGNOSIS — T887XXA Unspecified adverse effect of drug or medicament, initial encounter: Secondary | ICD-10-CM | POA: Diagnosis not present

## 2020-03-08 DIAGNOSIS — R279 Unspecified lack of coordination: Secondary | ICD-10-CM | POA: Diagnosis not present

## 2020-03-08 DIAGNOSIS — T50901A Poisoning by unspecified drugs, medicaments and biological substances, accidental (unintentional), initial encounter: Secondary | ICD-10-CM | POA: Diagnosis present

## 2020-03-08 DIAGNOSIS — R52 Pain, unspecified: Secondary | ICD-10-CM | POA: Diagnosis present

## 2020-03-08 DIAGNOSIS — U071 COVID-19: Secondary | ICD-10-CM | POA: Diagnosis not present

## 2020-03-08 DIAGNOSIS — T402X1A Poisoning by other opioids, accidental (unintentional), initial encounter: Secondary | ICD-10-CM | POA: Diagnosis not present

## 2020-03-08 DIAGNOSIS — T43501A Poisoning by unspecified antipsychotics and neuroleptics, accidental (unintentional), initial encounter: Secondary | ICD-10-CM | POA: Diagnosis not present

## 2020-03-08 DIAGNOSIS — Z743 Need for continuous supervision: Secondary | ICD-10-CM | POA: Diagnosis not present

## 2020-03-08 MED ORDER — OXYCODONE-ACETAMINOPHEN 5-325 MG PO TABS
1.0000 | ORAL_TABLET | Freq: Three times a day (TID) | ORAL | Status: DC | PRN
  Administered 2020-03-08: 1 via ORAL
  Filled 2020-03-08: qty 1

## 2020-03-08 NOTE — ED Provider Notes (Signed)
Emergency Medicine Observation Re-evaluation Note  [name] is a 45 y.o. female, seen on rounds today.  Pt initially presented to the ED for complaints of accidental overdose.  She endorses that she mistakenly took more of her medications then she was supposed to.  She then told one of the previous providers on 01/12 that she took it because she wanted to end her life.  TTS was called at that time for further evaluation.  TTS evaluate the patient and finds that she is psychiatry cleared.  She currently has no complaints at this time, she is tolerating p.o., vital signs remained stable.  She denies headaches, fevers, chills, shortness of breath, chest pain, Donnell pain, nausea, vomiting diarrhea, pedal edema.  She denies hallucinations or delusions at this time.  Has no homicidal or suicidal ideations.  Physical Exam  BP (!) 113/57 (BP Location: Left Arm)   Pulse 94   Temp 98.1 F (36.7 C) (Oral)   Resp 18   LMP 02/20/2020   SpO2 96%  Physical Exam General: Awake, no distress  HEENT: Atraumatic  Resp: Normal effort  Cardiac: Normal rate Abd: Nondistended, nontender  MSK: Moves all extremities without difficulty Neuro: Speech clear  ED Course / MDM  EKG:EKG Interpretation  Date/Time:  Wednesday March 06 2020 14:32:21 EST Ventricular Rate:  90 PR Interval:    QRS Duration: 97 QT Interval:  356 QTC Calculation: 436 R Axis:   82 Text Interpretation: Sinus rhythm Borderline T abnormalities, anterior leads Since last tracing rate slower Otherwise no significant change Confirmed by Daleen Bo [phone]) on 03/06/2020 5:19:30 PM    I have reviewed the labs performed to date as well as medications administered while in observation. no  Recent changes in the last 24 hours.   Plan  Current plan is for patient is medically and psychologically cleared, feel patient is safe for discharge.  Lab work has been reviewed appears to be stable at this time. Vital signs have remained stable.  Patient is COVID-positive will recommend self quarantine at this time. Have patient follow-up with post-COVID care for further evaluations.  Vital signs have remained stable, no indication for hospital admission.  Patient discussed with attending and they agreed with assessment and plan.  Patient given at home care as well strict return precautions.  Patient verbalized that they understood agreed to said plan.     Marcello Fennel, PA-C 03/08/20 1345    Horton, Alvin Critchley, DO 03/09/20 0007

## 2020-03-08 NOTE — ED Notes (Signed)
PTAR called @ 1532-per Percell Locus, RN called by Levada Dy

## 2020-03-08 NOTE — ED Notes (Signed)
Pt escorted to restroom. Ambulated without difficulty.

## 2020-03-08 NOTE — Discharge Instructions (Addendum)
You are COVID-positive. I recommend taking Tylenol for fever control and ibuprofen for pain control please follow dosing on the back of bottle.  I recommend staying hydrated and if you do not an appetite, I recommend soups as this will provide you with fluids and calories.  Your Covid test is pending I recommend self quarantine until you get your results back on MyChart.    you are Covid positive you must self quarantine for 5 days starting on symptom onset, if at the end of those 5 days you are feeling better you may return back to school/work, if you continue to have symptoms you must self quarantine for additional 5 days.  I would like you to contact "post Covid care" as they will provide you with information how to manage your Covid symptoms.   Come back to the emergency department if you develop chest pain, shortness of breath, severe abdominal pain, uncontrolled nausea, vomiting, diarrhea.

## 2020-03-08 NOTE — ED Notes (Signed)
Lunch Tray Ordered @ 1033. 

## 2020-03-08 NOTE — ED Notes (Signed)
Wound RN at pt bedside.

## 2020-03-08 NOTE — ED Notes (Signed)
Pt being assessed by [facility] provider via TTS.

## 2020-03-08 NOTE — Consult Note (Addendum)
Telepsych Consultation   Reason for Consult:  R/o SI Referring Physician:  Armanda Magic, DO Location of Patient: [facility] [id number] Location of Provider: [facility]  Patient Identification: JAYDEN WIGGERS MRN:  [id number] Principal Diagnosis: Overdose Diagnosis:  Principal Problem:   Overdose Active Problems:   Bipolar affective disorder ([place])   Sarcoidosis   Pain   Total Time spent with patient: 20 minutes  Subjective:   TOSHIKO VICARIO is a 45 y.o. female patient admitted with overdose. Patient states she currently has a headache and feels "so-so"; pt alert and oriented, cooperative. Patient inquired about going to possible skilled nursing facility for medication/pain management and skilled nursing care. Patient states she is in constant pain and mother refuses to give patient pain medications; further states "I don't know what I can or can't take because I'm a bipolar". States she lives with her mother, aunt (mother's sister) is the payee; pays mother $450/mos for patient and her son to live with her.   Patient states she took the medication "because I don't know how to manage my own medication and she doesn't either". Denies intent to overdose or suicide ideations; stating, "I just didn't know what I was supposed to take". Patient endorses hiding medications because mom was withholding medications. Patient denies any suicidal/homicidal ideations, auditory/visual hallucinations, and does not appear to be responding to any external/internal stimuli at this time.   Patient states she does not want to return to mom's house and would like to go somewhere to have pain better managed. Social work consult placed for follow-up.  HPI:   Patient was in prior relationship with son's father Sonia Side, broke up patient then moved in with 86 year old mother. Patient was Art therapist for 16 years; last worked "8 or 9 years ago". Patient reports past psychiatric history of bipolar disorder and  past inpatient hospitalizations ([address] [place], [place] x2, [facility] x4). Current medications include: 600mg  Seroquel HS, Diazepam 2mg  4 x's/day, Adderall 15mg  morning, afternoon. 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 Patient currently lives with mother and has two children, one in the home. Current outpatient providers: Dr Erling Cruz (psychiatrist), Dr Minette Brine (neurologist, pain management).   Past Psychiatric History: Bipolar disorder Inpatient hospitalizations  Risk to Self:  patient denies Risk to Others:  patient denies Prior Inpatient Therapy:  yes Prior Outpatient Therapy:  yes  Past Medical History:  Past Medical History:  Diagnosis Date  . ADHD   . Anal fistula   . Anxiety   . Arthritis    pt denies 5/3 visit   . Bipolar affective ([place])   . Blood transfusion without reported diagnosis   . Colostomy in place [facility])   . CVA (cerebrovascular accident) ([place])    left cerebellar infarct found accidentally on CT (2020)   . Depression   . Diverticulitis 2008   perforated/ requiring resection  . Fatty liver   . Gallstones   . GERD (gastroesophageal reflux disease)    occ.  Marland Kitchen Headache    after a fall  . Herpes simplex   . History of colon polyps   . History of hiatal hernia    small  . History of kidney stones    cystoscopy  basket removal  . Hypertension   . Iron deficiency anemia   . Obese   . OCD (obsessive compulsive disorder)   . Panic attacks   . Pulmonary nodules   . Sarcoidosis   .  Schizophrenia ([place])   . Shortness of breath dyspnea    pt denies on 5/3 visit   . Sleep apnea    mild, does not use c-pap machine  . Substance abuse ([place])    12 years ago-crack cocaine    Past Surgical History:  Procedure Laterality Date  . Abdominal wall hernia  07/2007   open repair with lysis of adhesions  . APPLICATION OF WOUND VAC N/A 06/29/2019   Procedure: PLACEMENT OF WOUND VAC;  Surgeon: Greer Pickerel, MD;   Location: [facility] ORS;  Service: General;  Laterality: N/A;  . BRONCHIAL BRUSHINGS  04/11/2019   Procedure: BRONCHIAL BRUSHINGS;  Surgeon: Collene Gobble, MD;  Location: [facility] ENDOSCOPY;  Service: Pulmonary;;  . BRONCHIAL WASHINGS  04/11/2019   Procedure: BRONCHIAL WASHINGS;  Surgeon: Collene Gobble, MD;  Location: [facility] ENDOSCOPY;  Service: Pulmonary;;  . COLONOSCOPY  03/13/2010   IZT:[id number] hemorrhoids likely cause of hematochezia, otherwise normal  . COLONOSCOPY  05/24/2019  . COLONOSCOPY WITH ESOPHAGOGASTRODUODENOSCOPY (EGD)  04/2019  . Colostomy reversal  11/2006  . DEBRIDEMENT OF ABDOMINAL WALL ABSCESS N/A 06/29/2019   Procedure: EXCISIONAL DEBRIDEMENT OF ABDOMINAL WALL/SUBCU SEROMA;  Surgeon: Greer Pickerel, MD;  [facility];  Service: General;  Laterality: N/A;  . ESOPHAGOGASTRODUODENOSCOPY  03/13/2010   PJA:[id number] esophagus, status post passage of a Maloney dilator/Small hiatal hernia/ Antral erosions, status post biopsy  . EXAMINATION UNDER ANESTHESIA N/A 05/11/2012   Procedure: EXAM UNDER ANESTHESIA;  Surgeon: Donato Heinz, MD;  Location: [facility]S;  Service: General;  Laterality: N/A;  . Exploratory laparotomy with resection  2008   colonoscopy  . FINE NEEDLE ASPIRATION  04/11/2019   Procedure: FINE NEEDLE ASPIRATION (FNA) LINEAR;  Surgeon: Collene Gobble, MD;  Location: [place] ENDOSCOPY;  Service: Pulmonary;;  . FINGER CLOSED REDUCTION Right 08/05/2012   Procedure: CLOSED REDUCTION RIGHT THUMB (FINGER);  Surgeon: Linna Hoff, MD;  Location: [place];  Service: Orthopedics;  Laterality: Right;  . HERNIA REPAIR     incisional hernia surgery   . Hx of abd wall seroma  08/2007   Drained via [place] in [place], [place]  . Hx of abd wall seroma  10/2007   Drained by Dr. Geroge Baseman in office  . INCISION AND DRAINAGE ABSCESS N/A 06/29/2019   Procedure: INCISION AND DRAINAGE;  Surgeon: Greer Pickerel, MD;  [facility];  Service: General;  Laterality: N/A;  . IR RADIOLOGIST EVAL & MGMT  03/15/2019   . IR RADIOLOGIST EVAL & MGMT  04/06/2019  . Kidney stones    . LAPAROSCOPIC GASTRIC SLEEVE RESECTION N/A 02/13/2014   Procedure: LAPAROSCOPIC GASTRIC SLEEVE RESECTION LYSIS OF ADHESIONS, UPPER ENDOSCOPY;  Surgeon: [name], MD;  [facility];  Service: General;  Laterality: N/A;  . LAPAROSCOPY N/A 09/28/2014   Procedure: LAPAROSCOPY DIAGNOSTIC, INCISION AND DRAINAGE WITH LAPAROSCOPIC EXPLORATION OF ABDOMINAL WALL SEROMA with ultrasound;  Surgeon: Greer Pickerel, MD;  [facility];  Service: General;  Laterality: N/A;  . LUNG BIOPSY  04/11/2019   Procedure: LUNG BIOPSY;  Surgeon: Collene Gobble, MD;  Location: [facility] ENDOSCOPY;  Service: Pulmonary;;  distal trachea  . PLACEMENT OF SETON N/A 05/11/2012   Procedure: PLACEMENT OF SETON;  Surgeon: Donato Heinz, MD;  Location: [facility];  Service: General;  Laterality: N/A;  . root canal 7-16    . TONSILLECTOMY    . TREATMENT FISTULA ANAL     x 2  . UPPER GASTROINTESTINAL ENDOSCOPY  05/24/2019  . VIDEO BRONCHOSCOPY WITH  ENDOBRONCHIAL ULTRASOUND N/A 04/11/2019   Procedure: VIDEO BRONCHOSCOPY WITH ENDOBRONCHIAL ULTRASOUND;  Surgeon: Collene Gobble, MD;  Location: [facility] ENDOSCOPY;  Service: Pulmonary;  Laterality: N/A;   Family History:  Family History  Problem Relation Age of Onset  . Asthma Maternal Grandmother   . Colon polyps Maternal Grandmother   . Colon cancer Maternal Grandmother   . Esophageal cancer Neg Hx   . Rectal cancer Neg Hx   . Stomach cancer Neg Hx   . Pancreatic cancer Neg Hx   . Liver disease Neg Hx    Family Psychiatric  History: not noted Social History:  Social History   Substance and Sexual Activity  Alcohol Use No  . Alcohol/week: 0.0 standard drinks     Social History   Substance and Sexual Activity  Drug Use Never    Social History   Socioeconomic History  . Marital status: Single    Spouse name: Not on file  . Number of children: 2  . Years of education: 94   . Highest education level: Not on file   Occupational History  . Occupation: Insurance claims handler: UNEMPLOYED  Tobacco Use  . Smoking status: Former Smoker    Packs/day: 0.50    Years: 14.00    Pack years: 7.00    Types: E-cigarettes    Quit date: 12/20/2013    Years since quitting: 6.2  . Smokeless tobacco: Never Used  . Tobacco comment: Vapor cigarettes.  Vaping Use  . Vaping Use: Former  . Quit date: 02/24/2019  . Substances: Nicotine, Flavoring  . Devices: started 2015  Substance and Sexual Activity  . Alcohol use: No    Alcohol/week: 0.0 standard drinks  . Drug use: Never  . Sexual activity: Not Currently    Birth control/protection: Abstinence  Other Topics Concern  . Not on file  Social History Narrative  . Not on file   Social Determinants of Health   Financial Resource Strain: Not on file  Food Insecurity: Not on file  Transportation Needs: Not on file  Physical Activity: Not on file  Stress: Not on file  Social Connections: Not on file   Additional Social History:    Allergies:  No Known Allergies  Labs:  Results for orders placed or performed during the hospital encounter of 03/06/20 (from the past 48 hour(s))  Comprehensive metabolic panel     Status: Abnormal   Collection Time: 03/06/20  2:49 PM  Result Value Ref Range   Sodium 137 135 - 145 mmol/L   Potassium 3.3 (L) 3.5 - 5.1 mmol/L   Chloride 103 98 - 111 mmol/L   CO2 22 22 - 32 mmol/L   Glucose, Bld 109 (H) 70 - 99 mg/dL    Comment: Glucose reference range applies only to samples taken after fasting for at least 8 hours.   BUN 10 6 - 20 mg/dL   Creatinine, Ser 0.83 0.44 - 1.00 mg/dL   Calcium 8.9 8.9 - 10.3 mg/dL   Total Protein 6.4 (L) 6.5 - 8.1 g/dL   Albumin 3.6 3.5 - 5.0 g/dL   AST 25 15 - 41 U/L   ALT 27 0 - 44 U/L   Alkaline Phosphatase 80 38 - 126 U/L   Total Bilirubin 0.5 0.3 - 1.2 mg/dL   GFR, Estimated >60 >60 mL/min    Comment: (NOTE) Calculated using the CKD-EPI Creatinine Equation (2021)    Anion gap 12  5 - 15    Comment: Performed  at [facility], [place] [address], [place], [place] [address]  CBC with Differential     Status: Abnormal   Collection Time: 03/06/20  2:49 PM  Result Value Ref Range   WBC 7.5 4.0 - 10.5 K/uL   RBC 4.09 3.87 - 5.11 MIL/uL   Hemoglobin 11.8 (L) 12.0 - 15.0 g/dL   HCT 38.2 36.0 - 46.0 %   MCV 93.4 80.0 - 100.0 fL   MCH 28.9 26.0 - 34.0 pg   MCHC 30.9 30.0 - 36.0 g/dL   RDW 13.6 11.5 - 15.5 %   Platelets 273 150 - 400 K/uL   nRBC 0.0 0.0 - 0.2 %   Neutrophils Relative % 82 %   Neutro Abs 6.2 1.7 - 7.7 K/uL   Lymphocytes Relative 5 %   Lymphs Abs 0.4 (L) 0.7 - 4.0 K/uL   Monocytes Relative 9 %   Monocytes Absolute 0.7 0.1 - 1.0 K/uL   Eosinophils Relative 4 %   Eosinophils Absolute 0.3 0.0 - 0.5 K/uL   Basophils Relative 0 %   Basophils Absolute 0.0 0.0 - 0.1 K/uL   Immature Granulocytes 0 %   Abs Immature Granulocytes 0.02 0.00 - 0.07 K/uL    Comment: Performed at [facility], [place] [address], [place], [place] [id number]  Salicylate level     Status: Abnormal   Collection Time: 03/06/20  2:49 PM  Result Value Ref Range   Salicylate Lvl [id number] (L) 7.0 - 30.0 mg/dL    Comment: Performed at [place] [address], [place], [place] [address]  Acetaminophen level     Status: None   Collection Time: 03/06/20  2:49 PM  Result Value Ref Range   Acetaminophen (Tylenol), Serum 25 10 - 30 ug/mL    Comment: (NOTE) Therapeutic concentrations vary significantly. A range of 10-30 ug/mL  may be an effective concentration for many patients. However, some  are best treated at concentrations outside of this range. Acetaminophen concentrations >150 ug/mL at 4 hours after ingestion  and >50 ug/mL at 12 hours after ingestion are often associated with  toxic reactions.  Performed at [facility], [place] [address], [place], [place] [address]   Ethanol     Status: None   Collection Time: 03/06/20  2:49 PM  Result Value Ref Range   Alcohol,  Ethyl (B) <10 <10 mg/dL    Comment: (NOTE) Lowest detectable limit for serum alcohol is 10 mg/dL.  For medical purposes only. Performed at [facility], [place] [address], [place], [place] [address]   Ammonia     Status: None   Collection Time: 03/06/20  2:49 PM  Result Value Ref Range   Ammonia 22 9 - 35 umol/L    Comment: Performed at [facility], [place] [address] [place], [place] [address]  SARS CORONAVIRUS 2 (TAT 6-24 HRS) Nasopharyngeal Nasopharyngeal Swab     Status: Abnormal   Collection Time: 03/06/20  2:51 PM   Specimen: Nasopharyngeal Swab  Result Value Ref Range   SARS Coronavirus 2 POSITIVE (A) NEGATIVE    Comment: (NOTE) SARS-CoV-2 target nucleic acids are DETECTED.  The SARS-CoV-2 RNA is generally detectable in upper and lower respiratory specimens during the acute phase of infection. Positive results are indicative of the presence of SARS-CoV-2 RNA. Clinical correlation with patient history and other diagnostic information is  necessary to determine patient infection status. Positive results do not rule out bacterial infection or co-infection  with other viruses.  The expected result is Negative.  Fact Sheet for Patients: SugarRoll.be  Fact Sheet for Healthcare Providers: https://www.woods-mathews.com/  This test is not yet approved or cleared by the [place] FDA and  has been authorized for detection and/or diagnosis of SARS-CoV-2 by FDA under an Emergency Use Authorization (EUA). This EUA will remain  in effect (meaning this test can be used) for the duration of the COVID-19 declaration under Section 564(b)(1) of the Act, 21 U. S.C. section 360bbb-3(b)(1), unless the authorization is terminated or revoked sooner.   Performed at [facility], [place] [address], [place], [place] [address]   I-Stat beta hCG blood, ED     Status: None   Collection Time: 03/06/20  3:17 PM  Result Value Ref Range    I-stat hCG, quantitative <5.0 <5 mIU/mL   Comment 3            Comment:   GEST. AGE      CONC.  (mIU/mL)   <=1 WEEK        5 - 50     2 WEEKS       50 - 500     3 WEEKS       100 - 10,000     4 WEEKS     1,000 - 30,000        FEMALE AND NON-PREGNANT FEMALE:     LESS THAN 5 mIU/mL   I-Stat venous blood gas, ([facility] ED only)     Status: Abnormal   Collection Time: 03/06/20  3:19 PM  Result Value Ref Range   pH, Ven 7.315 7.250 - 7.430   pCO2, Ven 49.1 44.0 - 60.0 mmHg   pO2, Ven 49.0 (H) 32.0 - 45.0 mmHg   Bicarbonate 25.0 20.0 - 28.0 mmol/L   TCO2 26 22 - 32 mmol/L   O2 Saturation 81.0 %   Acid-base deficit 2.0 0.0 - 2.0 mmol/L   Sodium 138 135 - 145 mmol/L   Potassium 3.3 (L) 3.5 - 5.1 mmol/L   Calcium, Ion 1.12 (L) 1.15 - 1.40 mmol/L   HCT 37.0 36.0 - 46.0 %   Hemoglobin 12.6 12.0 - 15.0 g/dL   Sample type VENOUS   Urinalysis, Routine w reflex microscopic Urine, Clean Catch     Status: Abnormal   Collection Time: 03/06/20  4:30 PM  Result Value Ref Range   Color, Urine STRAW (A) YELLOW   APPearance CLEAR CLEAR   Specific Gravity, Urine 1.005 1.005 - 1.030   pH 7.0 5.0 - 8.0   Glucose, UA NEGATIVE NEGATIVE mg/dL   Hgb urine dipstick NEGATIVE NEGATIVE   Bilirubin Urine NEGATIVE NEGATIVE   Ketones, ur 5 (A) NEGATIVE mg/dL   Protein, ur NEGATIVE NEGATIVE mg/dL   Nitrite NEGATIVE NEGATIVE   Leukocytes,Ua NEGATIVE NEGATIVE    Comment: Performed at [facility], [place] [address], [place], [place] [address]  Urine rapid drug screen (hosp performed)     Status: Abnormal   Collection Time: 03/06/20  4:59 PM  Result Value Ref Range   Opiates NONE DETECTED NONE DETECTED   Cocaine NONE DETECTED NONE DETECTED   Benzodiazepines POSITIVE (A) NONE DETECTED   Amphetamines NONE DETECTED NONE DETECTED   Tetrahydrocannabinol NONE DETECTED NONE DETECTED   Barbiturates NONE DETECTED NONE DETECTED    Comment: (NOTE) DRUG SCREEN FOR MEDICAL PURPOSES ONLY.  IF CONFIRMATION IS NEEDED FOR  ANY PURPOSE, NOTIFY LAB WITHIN 5 DAYS.  LOWEST DETECTABLE LIMITS FOR URINE  DRUG SCREEN Drug Class                     Cutoff (ng/mL) Amphetamine and metabolites    1000 Barbiturate and metabolites    200 Benzodiazepine                 [id number] Tricyclics and metabolites     300 Opiates and metabolites        300 Cocaine and metabolites        300 THC                            50 Performed at [facility], [place] [address], [place], [place] [address]   CBG monitoring, ED     Status: None   Collection Time: 03/07/20 10:11 PM  Result Value Ref Range   Glucose-Capillary 72 70 - 99 mg/dL    Comment: Glucose reference range applies only to samples taken after fasting for at least 8 hours.   Comment 1 Document in Chart     Medications:  Current Facility-Administered Medications  Medication Dose Route Frequency Provider Last Rate Last Admin  . cyanocobalamin ((VITAMIN B-12)) injection 1,000 mcg  1,000 mcg Intramuscular Q30 days Susy Frizzle, MD   1,000 mcg at 07/25/19 1041  . LORazepam (ATIVAN) tablet 1 mg  1 mg Oral Q6H PRN Ripley Fraise, MD   1 mg at 03/08/20 0526  . oxyCODONE-acetaminophen (PERCOCET/ROXICET) 5-325 MG per tablet 1 tablet  1 tablet Oral Q8H PRN Rayna Sexton, PA-C   1 tablet at 03/08/20 1150  . QUEtiapine (SEROQUEL) tablet 200 mg  200 mg Oral QHS Horton, Kristie M, DO   200 mg at 03/07/20 2121   Current Outpatient Medications  Medication Sig Dispense Refill  . albuterol (VENTOLIN HFA) 108 (90 Base) MCG/ACT inhaler INHALE 2 PUFFS INTO THE LUNGS EVERY 4 (FOUR) HOURS AS NEEDED FOR WHEEZING OR SHORTNESS OF BREATH. 18 each 1  . amphetamine-dextroamphetamine (ADDERALL) 15 MG tablet Take 15 mg by mouth 3 (three) times daily.     Marland Kitchen atenolol (TENORMIN) 25 MG tablet Take 25 mg by mouth 2 (two) times daily.     Marland Kitchen atorvastatin (LIPITOR) 40 MG tablet TAKE 1 TABLET BY MOUTH EVERY DAY (Patient taking differently: Take 40 mg by mouth daily.) 90 tablet 1  . clopidogrel  (PLAVIX) 75 MG tablet Take 75 mg by mouth daily.    . diazepam (VALIUM) 2 MG tablet Take 2 mg by mouth in the morning, at noon, in the evening, and at bedtime.    . ferrous sulfate 325 (65 FE) MG tablet Take 325 mg by mouth daily with breakfast.    . gabapentin (NEURONTIN) 600 MG tablet Take 600 mg by mouth 3 (three) times daily.    . hydrocortisone (ANUSOL-HC) 2.5 % rectal cream Place 1 application rectally at bedtime. 30 g 1  . Multiple Vitamins-Minerals (MULTIVITAMIN WITH MINERALS) tablet Take 1 tablet by mouth daily.     . ondansetron (ZOFRAN ODT) 4 MG disintegrating tablet Take 1 tablet (4 mg total) by mouth every 8 (eight) hours as needed for nausea or vomiting. 20 tablet 0  . oxyCODONE-acetaminophen (PERCOCET) 7.5-325 MG tablet Take 1 tablet by mouth 3 (three) times daily as needed for moderate pain.    . pantoprazole (PROTONIX) 40 MG tablet Take 1 tablet (40 mg total) by mouth daily. 30 tablet 11  . QUEtiapine (SEROQUEL) 300 MG tablet Take  600 mg by mouth at bedtime.     . topiramate (TOPAMAX) 100 MG tablet Take 100 mg by mouth at bedtime.     . valACYclovir (VALTREX) 500 MG tablet TAKE 1 TABLET (500 MG TOTAL) BY MOUTH DAILY AS NEEDED (FEVER BLISTER). 90 tablet 2    Musculoskeletal: Strength & Muscle Tone: decreased Gait & Station: unable to assess Patient leans: Backward  Psychiatric Specialty Exam: Physical Exam Vitals and nursing note reviewed.  Psychiatric:        Attention and Perception: Attention and perception normal.        Speech: Speech normal.        Behavior: Behavior is cooperative.        Thought Content: Thought content normal.        Cognition and Memory: Cognition normal.        Judgment: Judgment normal.     Review of Systems  Constitutional: Positive for fatigue.  Musculoskeletal: Positive for back pain.  Skin: Positive for wound.  Psychiatric/Behavioral: Positive for dysphoric mood.    Blood pressure (!) 113/57, pulse 94, temperature 98.1 F (36.7  C), temperature source Oral, resp. rate 18, last menstrual period 02/20/2020, SpO2 96 %.There is no height or weight on file to calculate BMI.  General Appearance: Casual  Eye Contact:  Fair  Speech:  Normal Rate  Volume:  Decreased  Mood:  Dysphoric  Affect:  Congruent  Thought Process:  Coherent  Orientation:  Full (Time, Place, and Person)  Thought Content:  Logical  Suicidal Thoughts:  No  Homicidal Thoughts:  No  Memory:  Immediate;   Fair Recent;   Fair Remote;   Fair  Judgement:  Fair  Insight:  Present  Psychomotor Activity:  Normal  Concentration:  Concentration: Fair and Attention Span: Fair  Recall:  [employer] of Knowledge:  Fair  Language:  Fair  Akathisia:  NA  Handed:  Right  AIMS (if indicated):     Assets:  Desire for Improvement Financial Resources/Insurance Resilience  ADL's:  Impaired  Cognition:  WNL  Sleep:      Treatment Plan Summary: Daily contact with patient to assess and evaluate symptoms and progress in treatment, Medication management and Plan for social work to follow up for placement. Patient does not meet criteria for inpatient psychiatric placement at this time.   Disposition: No evidence of imminent risk to self or others at present.   Patient does not meet criteria for psychiatric inpatient admission. Supportive therapy provided about ongoing stressors. Discussed crisis plan, support from social network, calling 911, coming to the Emergency Department, and calling Suicide Hotline. Social work consult placed for possible Skilled Nursing placement.   This service was provided via telemedicine using a 2-way, interactive audio and video technology.  Names of all persons participating in this telemedicine service and their role in this encounter. Name: Oneida Alar Role: PMHNP  Name: Hampton Abbot Role: Attending MD  Name: Lynnell Chad Role: patient  Name:  Role:     Inda Merlin, NP 03/08/2020 12:23  PM

## 2020-03-08 NOTE — Consult Note (Addendum)
[facility] Nurse Consult Note: Reason for Consult: Consult requested for abd wound.  Pt is in isolation for Covid and states it has been present several years since a previous surgery and she is followed by the outpatient wound care center prior to admission.  They have told her it is nonhealing related to difficulty with the mesh.   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 Tender to touch Dressing procedure/placement/frequency: Continue present plan of care as ordered by the outpatient wound care center.  Pt should follow-up with them after discharge. Topical treatment orders provided for bedside nurses to perform as follows: Apply moist fluffed gauze to abd BID, using swab to fill, then cover with ABD pad and tape. Please re-consult if further assistance is needed.  Thank-you,  Julien Girt MSN, [place], [place], [place], [place]

## 2020-03-11 DIAGNOSIS — F4310 Post-traumatic stress disorder, unspecified: Secondary | ICD-10-CM | POA: Diagnosis not present

## 2020-03-11 DIAGNOSIS — F419 Anxiety disorder, unspecified: Secondary | ICD-10-CM | POA: Diagnosis not present

## 2020-03-11 DIAGNOSIS — U071 COVID-19: Secondary | ICD-10-CM | POA: Diagnosis not present

## 2020-03-11 DIAGNOSIS — F411 Generalized anxiety disorder: Secondary | ICD-10-CM | POA: Diagnosis not present

## 2020-03-11 DIAGNOSIS — R9431 Abnormal electrocardiogram [ECG] [EKG]: Secondary | ICD-10-CM | POA: Diagnosis not present

## 2020-03-11 DIAGNOSIS — Z79899 Other long term (current) drug therapy: Secondary | ICD-10-CM | POA: Diagnosis not present

## 2020-03-11 DIAGNOSIS — F4010 Social phobia, unspecified: Secondary | ICD-10-CM | POA: Diagnosis not present

## 2020-03-11 DIAGNOSIS — F209 Schizophrenia, unspecified: Secondary | ICD-10-CM | POA: Diagnosis present

## 2020-03-11 DIAGNOSIS — F429 Obsessive-compulsive disorder, unspecified: Secondary | ICD-10-CM | POA: Diagnosis present

## 2020-03-11 DIAGNOSIS — X58XXXA Exposure to other specified factors, initial encounter: Secondary | ICD-10-CM | POA: Diagnosis not present

## 2020-03-11 DIAGNOSIS — Z7902 Long term (current) use of antithrombotics/antiplatelets: Secondary | ICD-10-CM | POA: Diagnosis not present

## 2020-03-11 DIAGNOSIS — Y999 Unspecified external cause status: Secondary | ICD-10-CM | POA: Diagnosis not present

## 2020-03-11 DIAGNOSIS — F319 Bipolar disorder, unspecified: Secondary | ICD-10-CM | POA: Diagnosis not present

## 2020-03-11 DIAGNOSIS — J1282 Pneumonia due to coronavirus disease 2019: Secondary | ICD-10-CM | POA: Diagnosis not present

## 2020-03-11 DIAGNOSIS — Z87891 Personal history of nicotine dependence: Secondary | ICD-10-CM | POA: Diagnosis not present

## 2020-03-11 DIAGNOSIS — R45851 Suicidal ideations: Secondary | ICD-10-CM | POA: Diagnosis not present

## 2020-03-11 DIAGNOSIS — S31109D Unspecified open wound of abdominal wall, unspecified quadrant without penetration into peritoneal cavity, subsequent encounter: Secondary | ICD-10-CM | POA: Diagnosis not present

## 2020-03-11 DIAGNOSIS — D869 Sarcoidosis, unspecified: Secondary | ICD-10-CM | POA: Diagnosis present

## 2020-03-11 DIAGNOSIS — R109 Unspecified abdominal pain: Secondary | ICD-10-CM | POA: Diagnosis not present

## 2020-03-11 DIAGNOSIS — T8189XA Other complications of procedures, not elsewhere classified, initial encounter: Secondary | ICD-10-CM | POA: Diagnosis present

## 2020-03-11 DIAGNOSIS — R590 Localized enlarged lymph nodes: Secondary | ICD-10-CM | POA: Diagnosis not present

## 2020-03-11 DIAGNOSIS — K603 Anal fistula: Secondary | ICD-10-CM | POA: Diagnosis present

## 2020-03-11 DIAGNOSIS — I10 Essential (primary) hypertension: Secondary | ICD-10-CM | POA: Diagnosis not present

## 2020-03-11 DIAGNOSIS — Z9884 Bariatric surgery status: Secondary | ICD-10-CM | POA: Diagnosis not present

## 2020-03-11 DIAGNOSIS — Z8673 Personal history of transient ischemic attack (TIA), and cerebral infarction without residual deficits: Secondary | ICD-10-CM | POA: Diagnosis not present

## 2020-03-11 DIAGNOSIS — G894 Chronic pain syndrome: Secondary | ICD-10-CM | POA: Diagnosis present

## 2020-03-11 DIAGNOSIS — E876 Hypokalemia: Secondary | ICD-10-CM | POA: Diagnosis not present

## 2020-03-11 DIAGNOSIS — F909 Attention-deficit hyperactivity disorder, unspecified type: Secondary | ICD-10-CM | POA: Diagnosis present

## 2020-03-12 DIAGNOSIS — F419 Anxiety disorder, unspecified: Secondary | ICD-10-CM | POA: Diagnosis not present

## 2020-03-12 DIAGNOSIS — E876 Hypokalemia: Secondary | ICD-10-CM | POA: Diagnosis present

## 2020-03-12 DIAGNOSIS — K603 Anal fistula: Secondary | ICD-10-CM | POA: Diagnosis present

## 2020-03-12 DIAGNOSIS — Z79899 Other long term (current) drug therapy: Secondary | ICD-10-CM | POA: Diagnosis not present

## 2020-03-12 DIAGNOSIS — F909 Attention-deficit hyperactivity disorder, unspecified type: Secondary | ICD-10-CM | POA: Diagnosis present

## 2020-03-12 DIAGNOSIS — R109 Unspecified abdominal pain: Secondary | ICD-10-CM | POA: Diagnosis not present

## 2020-03-12 DIAGNOSIS — T8189XA Other complications of procedures, not elsewhere classified, initial encounter: Secondary | ICD-10-CM | POA: Diagnosis present

## 2020-03-12 DIAGNOSIS — G894 Chronic pain syndrome: Secondary | ICD-10-CM | POA: Diagnosis present

## 2020-03-12 DIAGNOSIS — F4010 Social phobia, unspecified: Secondary | ICD-10-CM | POA: Diagnosis present

## 2020-03-12 DIAGNOSIS — F319 Bipolar disorder, unspecified: Secondary | ICD-10-CM | POA: Diagnosis present

## 2020-03-12 DIAGNOSIS — R9431 Abnormal electrocardiogram [ECG] [EKG]: Secondary | ICD-10-CM | POA: Diagnosis not present

## 2020-03-12 DIAGNOSIS — U071 COVID-19: Secondary | ICD-10-CM | POA: Diagnosis present

## 2020-03-12 DIAGNOSIS — Z9884 Bariatric surgery status: Secondary | ICD-10-CM | POA: Diagnosis not present

## 2020-03-12 DIAGNOSIS — F4310 Post-traumatic stress disorder, unspecified: Secondary | ICD-10-CM | POA: Diagnosis not present

## 2020-03-12 DIAGNOSIS — F3110 Bipolar disorder, current episode manic without psychotic features, unspecified: Secondary | ICD-10-CM | POA: Diagnosis not present

## 2020-03-12 DIAGNOSIS — F411 Generalized anxiety disorder: Secondary | ICD-10-CM | POA: Diagnosis present

## 2020-03-12 DIAGNOSIS — J1282 Pneumonia due to coronavirus disease 2019: Secondary | ICD-10-CM | POA: Diagnosis not present

## 2020-03-12 DIAGNOSIS — D869 Sarcoidosis, unspecified: Secondary | ICD-10-CM | POA: Diagnosis present

## 2020-03-12 DIAGNOSIS — F209 Schizophrenia, unspecified: Secondary | ICD-10-CM | POA: Diagnosis present

## 2020-03-12 DIAGNOSIS — F429 Obsessive-compulsive disorder, unspecified: Secondary | ICD-10-CM | POA: Diagnosis present

## 2020-03-12 DIAGNOSIS — I10 Essential (primary) hypertension: Secondary | ICD-10-CM | POA: Diagnosis present

## 2020-03-12 DIAGNOSIS — Z8673 Personal history of transient ischemic attack (TIA), and cerebral infarction without residual deficits: Secondary | ICD-10-CM | POA: Diagnosis not present

## 2020-03-12 DIAGNOSIS — Z87891 Personal history of nicotine dependence: Secondary | ICD-10-CM | POA: Diagnosis not present

## 2020-03-12 DIAGNOSIS — R45851 Suicidal ideations: Secondary | ICD-10-CM | POA: Diagnosis present

## 2020-03-12 DIAGNOSIS — Z7902 Long term (current) use of antithrombotics/antiplatelets: Secondary | ICD-10-CM | POA: Diagnosis not present

## 2020-03-13 ENCOUNTER — Other Ambulatory Visit: Payer: Medicare Other

## 2020-03-14 ENCOUNTER — Telehealth: Payer: Self-pay | Admitting: Family Medicine

## 2020-03-14 NOTE — Telephone Encounter (Signed)
Call placed to patient mother [name].   Reports that she has had multiple issues with her anxiety, suicidal ideation, and multiple hospitalizations for her mental health.   Patient mother concerned about patient not getting help as she needs. Reports that patient has tested positive for COVID as well.   Advised if patient is still in crisis, call behavioral health or call behavioral health urgent care.

## 2020-03-14 NOTE — Telephone Encounter (Signed)
[name] pt mother call would like to speak with Dr.Pickard concern [place] behavorial issues?? [phone]

## 2020-03-15 NOTE — Telephone Encounter (Signed)
Call placed to patient and patient mother [name] made aware.

## 2020-03-15 NOTE — Telephone Encounter (Signed)
I agree.  I would be glad to see her but has she needs to see her psychiatrist (can mom schedule a telephone visit with her psychiatrist) as this seems to be a psychiatric issue.

## 2020-03-18 ENCOUNTER — Ambulatory Visit (HOSPITAL_COMMUNITY): Payer: Medicare Other

## 2020-03-19 ENCOUNTER — Ambulatory Visit (INDEPENDENT_AMBULATORY_CARE_PROVIDER_SITE_OTHER): Payer: Medicare Other | Admitting: Family Medicine

## 2020-03-19 ENCOUNTER — Other Ambulatory Visit: Payer: Self-pay

## 2020-03-19 ENCOUNTER — Encounter: Payer: Medicare Other | Attending: General Surgery | Admitting: Skilled Nursing Facility1

## 2020-03-19 ENCOUNTER — Encounter: Payer: Self-pay | Admitting: Family Medicine

## 2020-03-19 VITALS — BP 122/60 | HR 84 | Ht 66.0 in | Wt 222.0 lb

## 2020-03-19 DIAGNOSIS — F3162 Bipolar disorder, current episode mixed, moderate: Secondary | ICD-10-CM | POA: Diagnosis not present

## 2020-03-19 DIAGNOSIS — K912 Postsurgical malabsorption, not elsewhere classified: Secondary | ICD-10-CM | POA: Insufficient documentation

## 2020-03-19 DIAGNOSIS — Z713 Dietary counseling and surveillance: Secondary | ICD-10-CM | POA: Insufficient documentation

## 2020-03-19 DIAGNOSIS — Z9884 Bariatric surgery status: Secondary | ICD-10-CM | POA: Insufficient documentation

## 2020-03-19 NOTE — Progress Notes (Signed)
Subjective:    Patient ID: [name], female    DOB: Jan 25, 1976, 45 y.o.   MRN: [id number]  HPI Patient request home health wound care.  She states that she needs a nurse to come to the group home that she is going to move to to do dressing changes.  Patient is evasive in her history.  I was aware that the patient had been to the emergency room on the 12th, the 13th, and the 14th.  One time was for a suicide attempt although she states it was an accidental overdose.  She did not volunteer this initially until I confronted her about it.  She states that she took medication by accident.  I then asked her which medication she was taking.  She has been seeing a psychiatrist, Dr. Erling Cruz.  She is also seeing a neurologist Dr. Merlene Laughter for pain management.  I was concerned which medication that she took.  Truthfully I am concerned that the patient is suffering from bipolar mania but I was trying to get from the history who is managing her medication to determine the best way of managing this.  The patient then states do not judge me.  "Promise you will judge me".  I explained to the patient that I am her doctor and that I am not here to judge her I am here simply to help her.  However also explained to the patient that I need her to be honest with me in order for me to be able to find the best way to treat her.  The patient then continually states that her mother is out to get her.  She states that her mother is sabotaging her medication.  Patient never took ownership of what happened or admitted she is the one that took the medication.  Ultimately, her grandmother is on hospice, and it sounds like the patient may have taken some of her morphine due to her pain in her abdomen.  Whether this was intentional or unintentional I am not certain.  Patient states that it was unintentional.  She did not intend to harm herself.  However she is very frustrated and mad with my line of questioning.  My personal opinion is  that the patient needs supervision.  Her medication must be administered to her as I do not feel that she has insight to control her medications and therefore could accidentally or intentionally take the wrong medication.  She is getting Valium 4 times a day from her psychiatrist in addition to tramadol for pain.  She is also on Seroquel.  She then reluctantly admits that her mother "dropped her off at [facility]" last week.  She states that she was having bipolar seizures and that they started her on Depakote.  However she does not know the dose or how much she is taking.  I feel that the patient needs medication supervision and a mood stabilizer.  However as I am asking her about who is supervising her medication, she states that she is going to be moving to a group home.  He states that I need to check her for COVID and to get her wound care to go to the group home.  I asked her what date that she is going to the group home and then she becomes very upset with me and leaves.  My intention in knowing the date is to try to determine who is administering her medication because I feel that she needs supervision.  However  the patient refuses my help and leaves AGAINST MEDICAL ADVICE. Past Medical History:  Diagnosis Date  . ADHD   . Anal fistula   . Anxiety   . Arthritis    pt denies 5/3 visit   . Bipolar affective ([place])   . Blood transfusion without reported diagnosis   . Colostomy in place [facility])   . CVA (cerebrovascular accident) ([place])    left cerebellar infarct found accidentally on CT (2020)   . Depression   . Diverticulitis 2008   perforated/ requiring resection  . Fatty liver   . Gallstones   . GERD (gastroesophageal reflux disease)    occ.  Marland Kitchen Headache    after a fall  . Herpes simplex   . History of colon polyps   . History of hiatal hernia    small  . History of kidney stones    cystoscopy  basket removal  . Hypertension   . Iron deficiency anemia   . Obese   . OCD (obsessive  compulsive disorder)   . Panic attacks   . Pulmonary nodules   . Sarcoidosis   . Schizophrenia ([place])   . Shortness of breath dyspnea    pt denies on 5/3 visit   . Sleep apnea    mild, does not use c-pap machine  . Substance abuse ([place])    12 years ago-crack cocaine   Current Outpatient Medications on File Prior to Visit  Medication Sig Dispense Refill  . albuterol (VENTOLIN HFA) 108 (90 Base) MCG/ACT inhaler INHALE 2 PUFFS INTO THE LUNGS EVERY 4 (FOUR) HOURS AS NEEDED FOR WHEEZING OR SHORTNESS OF BREATH. 18 each 1  . atenolol (TENORMIN) 25 MG tablet Take 25 mg by mouth 2 (two) times daily.     Marland Kitchen atorvastatin (LIPITOR) 40 MG tablet TAKE 1 TABLET BY MOUTH EVERY DAY (Patient taking differently: Take 40 mg by mouth daily.) 90 tablet 1  . clopidogrel (PLAVIX) 75 MG tablet Take 75 mg by mouth daily.    . diazepam (VALIUM) 2 MG tablet Take 2 mg by mouth in the morning, at noon, in the evening, and at bedtime.    . ferrous sulfate 325 (65 FE) MG tablet Take 325 mg by mouth daily with breakfast.    . gabapentin (NEURONTIN) 600 MG tablet Take 600 mg by mouth 3 (three) times daily.    . hydrOXYzine (ATARAX/VISTARIL) 50 MG tablet hydroxyzine HCl 50 mg tablet    . Multiple Vitamins-Minerals (MULTIVITAMIN WITH MINERALS) tablet Take 1 tablet by mouth daily.     . ondansetron (ZOFRAN ODT) 4 MG disintegrating tablet Take 1 tablet (4 mg total) by mouth every 8 (eight) hours as needed for nausea or vomiting. 20 tablet 0  . oxyCODONE-acetaminophen (PERCOCET) 7.5-325 MG tablet Take 1 tablet by mouth 3 (three) times daily as needed for moderate pain.    . pantoprazole (PROTONIX) 40 MG tablet Take 1 tablet (40 mg total) by mouth daily. 30 tablet 11  . QUEtiapine (SEROQUEL) 300 MG tablet Take 600 mg by mouth at bedtime.     . topiramate (TOPAMAX) 100 MG tablet Take 100 mg by mouth at bedtime.     . valACYclovir (VALTREX) 500 MG tablet TAKE 1 TABLET (500 MG TOTAL) BY MOUTH DAILY AS NEEDED (FEVER BLISTER). 90  tablet 2  . hydrocortisone (ANUSOL-HC) 2.5 % rectal cream Place 1 application rectally at bedtime. (Patient not taking: Reported on 03/19/2020) 30 g 1   Current Facility-Administered Medications on File Prior to Visit  Medication  Dose Route Frequency Provider Last Rate Last Admin  . cyanocobalamin ((VITAMIN B-12)) injection 1,000 mcg  1,000 mcg Intramuscular Q30 days Susy Frizzle, MD   1,000 mcg at 07/25/19 1041   No Known Allergies Social History   Socioeconomic History  . Marital status: Single    Spouse name: Not on file  . Number of children: 2  . Years of education: 3   . Highest education level: Not on file  Occupational History  . Occupation: Insurance claims handler: UNEMPLOYED  Tobacco Use  . Smoking status: Former Smoker    Packs/day: 0.50    Years: 14.00    Pack years: 7.00    Types: E-cigarettes    Quit date: 12/20/2013    Years since quitting: 6.2  . Smokeless tobacco: Never Used  . Tobacco comment: Vapor cigarettes.  Vaping Use  . Vaping Use: Former  . Quit date: 02/24/2019  . Substances: Nicotine, Flavoring  . Devices: started 2015  Substance and Sexual Activity  . Alcohol use: No    Alcohol/week: 0.0 standard drinks  . Drug use: Never  . Sexual activity: Not Currently    Birth control/protection: Abstinence  Other Topics Concern  . Not on file  Social History Narrative  . Not on file   Social Determinants of Health   Financial Resource Strain: Not on file  Food Insecurity: Not on file  Transportation Needs: Not on file  Physical Activity: Not on file  Stress: Not on file  Social Connections: Not on file  Intimate Partner Violence: Not on file     Review of Systems  All other systems reviewed and are negative.      Objective:   Physical Exam Vitals reviewed.  Constitutional:      Appearance: She is obese.  Neurological:     Mental Status: She is alert.  Psychiatric:        Attention and Perception: Attention normal.         Mood and Affect: Affect is labile and angry.        Speech: Speech is tangential.        Behavior: Behavior is agitated.        Thought Content: Thought content does not include suicidal ideation. Thought content does not include suicidal plan.        Cognition and Memory: Cognition and memory normal.           Assessment & Plan:  Bipolar disorder, current episode mixed, moderate ([place])  Patient is evasive in answering my questions.  I explained to the patient that I feel she needs supervision and that someone needs to administer her medications to avoid unintentional or intentional overuse.  She is on benzodiazepines from her psychiatrist as well as pain medication from her pain specialist.  Furthermore she seems to be manic in my opinion.  She is frequently going to the emergency room.  Her anxiety is worse.  However I feel that she is not telling me the entire truth about the admission to [facility].  I suspect that they put her on Depakote as a mood stabilizer and not for seizures.  I feel patient needs supervision and that her medication should be administered by another individual.  I am also concerned that she needs some type of mood stabilizer and follow-up with her psychiatrist.  I do not feel that she should self-administer her pain medication.  I was honest with the patient and told her this.  This made  the patient extremely upset with me when I explained this to her and she left AGAINST MEDICAL ADVICE

## 2020-03-20 ENCOUNTER — Telehealth: Payer: Self-pay | Admitting: Family Medicine

## 2020-03-20 DIAGNOSIS — D539 Nutritional anemia, unspecified: Secondary | ICD-10-CM | POA: Diagnosis not present

## 2020-03-20 DIAGNOSIS — D869 Sarcoidosis, unspecified: Secondary | ICD-10-CM | POA: Diagnosis not present

## 2020-03-20 DIAGNOSIS — E46 Unspecified protein-calorie malnutrition: Secondary | ICD-10-CM | POA: Diagnosis not present

## 2020-03-20 DIAGNOSIS — S31109D Unspecified open wound of abdominal wall, unspecified quadrant without penetration into peritoneal cavity, subsequent encounter: Secondary | ICD-10-CM | POA: Diagnosis not present

## 2020-03-20 DIAGNOSIS — S301XXD Contusion of abdominal wall, subsequent encounter: Secondary | ICD-10-CM | POA: Diagnosis not present

## 2020-03-20 NOTE — Telephone Encounter (Signed)
pt call would like be discharge from [facility]

## 2020-03-20 NOTE — Telephone Encounter (Signed)
Pt call back change of plans would like to stay with Dr.Pickard at [facility]

## 2020-03-21 ENCOUNTER — Ambulatory Visit: Payer: Medicare Other | Admitting: Emergency Medicine

## 2020-03-22 ENCOUNTER — Ambulatory Visit: Payer: Medicare Other | Admitting: Family Medicine

## 2020-03-26 ENCOUNTER — Ambulatory Visit (HOSPITAL_COMMUNITY): Payer: Medicare Other

## 2020-03-29 ENCOUNTER — Ambulatory Visit: Payer: Medicare Other | Admitting: Family Medicine

## 2020-04-02 ENCOUNTER — Ambulatory Visit: Payer: Medicare Other | Admitting: Skilled Nursing Facility1

## 2020-04-05 ENCOUNTER — Telehealth: Payer: Self-pay | Admitting: Gastroenterology

## 2020-04-05 ENCOUNTER — Other Ambulatory Visit: Payer: Self-pay | Admitting: Family Medicine

## 2020-04-05 NOTE — Telephone Encounter (Signed)
Dr. Fuller Plan please see note below.

## 2020-04-05 NOTE — Telephone Encounter (Signed)
Pt currently admitted to psych hospital in [place]. Dr. Vickey Huger calling and would like to speak with Dr. Tarri Glenn regarding how to better help the pt with her GI issues while she is with them. Pt having issues with frequent diarrhea and not wanting to eat and having pain due to fistula. Please call Dr. Vickey Huger at [phone].

## 2020-04-05 NOTE — Telephone Encounter (Signed)
I do not have any relationship with this patient. Is this intended for Dr. Fuller Plan?

## 2020-04-05 NOTE — Telephone Encounter (Signed)
Dr. Linard Millers calling regarding patient currently at the hospital requesting to speak wit a nurse or Dr. Tarri Glenn.  [employer]

## 2020-04-11 DIAGNOSIS — F6089 Other specific personality disorders: Secondary | ICD-10-CM | POA: Diagnosis not present

## 2020-04-11 DIAGNOSIS — M5442 Lumbago with sciatica, left side: Secondary | ICD-10-CM | POA: Diagnosis not present

## 2020-04-11 DIAGNOSIS — F312 Bipolar disorder, current episode manic severe with psychotic features: Secondary | ICD-10-CM | POA: Diagnosis not present

## 2020-04-11 DIAGNOSIS — M5441 Lumbago with sciatica, right side: Secondary | ICD-10-CM | POA: Diagnosis not present

## 2020-04-11 DIAGNOSIS — G8929 Other chronic pain: Secondary | ICD-10-CM | POA: Diagnosis not present

## 2020-04-12 DIAGNOSIS — M5441 Lumbago with sciatica, right side: Secondary | ICD-10-CM | POA: Diagnosis not present

## 2020-04-12 DIAGNOSIS — F6089 Other specific personality disorders: Secondary | ICD-10-CM | POA: Diagnosis not present

## 2020-04-12 DIAGNOSIS — G8929 Other chronic pain: Secondary | ICD-10-CM | POA: Diagnosis not present

## 2020-04-12 DIAGNOSIS — M5442 Lumbago with sciatica, left side: Secondary | ICD-10-CM | POA: Diagnosis not present

## 2020-04-12 DIAGNOSIS — F312 Bipolar disorder, current episode manic severe with psychotic features: Secondary | ICD-10-CM | POA: Diagnosis not present

## 2020-04-13 DIAGNOSIS — M5442 Lumbago with sciatica, left side: Secondary | ICD-10-CM | POA: Diagnosis not present

## 2020-04-13 DIAGNOSIS — M5441 Lumbago with sciatica, right side: Secondary | ICD-10-CM | POA: Diagnosis not present

## 2020-04-13 DIAGNOSIS — G8929 Other chronic pain: Secondary | ICD-10-CM | POA: Diagnosis not present

## 2020-04-13 DIAGNOSIS — F312 Bipolar disorder, current episode manic severe with psychotic features: Secondary | ICD-10-CM | POA: Diagnosis not present

## 2020-04-13 DIAGNOSIS — F6089 Other specific personality disorders: Secondary | ICD-10-CM | POA: Diagnosis not present

## 2020-04-14 DIAGNOSIS — F312 Bipolar disorder, current episode manic severe with psychotic features: Secondary | ICD-10-CM | POA: Diagnosis not present

## 2020-04-14 DIAGNOSIS — G8929 Other chronic pain: Secondary | ICD-10-CM | POA: Diagnosis not present

## 2020-04-14 DIAGNOSIS — F6089 Other specific personality disorders: Secondary | ICD-10-CM | POA: Diagnosis not present

## 2020-04-14 DIAGNOSIS — M5441 Lumbago with sciatica, right side: Secondary | ICD-10-CM | POA: Diagnosis not present

## 2020-04-14 DIAGNOSIS — M5442 Lumbago with sciatica, left side: Secondary | ICD-10-CM | POA: Diagnosis not present

## 2020-04-18 DIAGNOSIS — F6089 Other specific personality disorders: Secondary | ICD-10-CM | POA: Diagnosis not present

## 2020-04-18 DIAGNOSIS — F312 Bipolar disorder, current episode manic severe with psychotic features: Secondary | ICD-10-CM | POA: Diagnosis not present

## 2020-04-18 DIAGNOSIS — G8929 Other chronic pain: Secondary | ICD-10-CM | POA: Diagnosis not present

## 2020-04-18 DIAGNOSIS — M5442 Lumbago with sciatica, left side: Secondary | ICD-10-CM | POA: Diagnosis not present

## 2020-04-18 DIAGNOSIS — M5441 Lumbago with sciatica, right side: Secondary | ICD-10-CM | POA: Diagnosis not present

## 2020-04-19 DIAGNOSIS — F6089 Other specific personality disorders: Secondary | ICD-10-CM | POA: Diagnosis not present

## 2020-04-19 DIAGNOSIS — M5442 Lumbago with sciatica, left side: Secondary | ICD-10-CM | POA: Diagnosis not present

## 2020-04-19 DIAGNOSIS — M5441 Lumbago with sciatica, right side: Secondary | ICD-10-CM | POA: Diagnosis not present

## 2020-04-19 DIAGNOSIS — F312 Bipolar disorder, current episode manic severe with psychotic features: Secondary | ICD-10-CM | POA: Diagnosis not present

## 2020-04-19 DIAGNOSIS — G8929 Other chronic pain: Secondary | ICD-10-CM | POA: Diagnosis not present

## 2020-04-22 DIAGNOSIS — Z86010 Personal history of colonic polyps: Secondary | ICD-10-CM | POA: Diagnosis not present

## 2020-04-22 DIAGNOSIS — K625 Hemorrhage of anus and rectum: Secondary | ICD-10-CM | POA: Diagnosis not present

## 2020-04-22 DIAGNOSIS — K219 Gastro-esophageal reflux disease without esophagitis: Secondary | ICD-10-CM | POA: Diagnosis not present

## 2020-04-22 DIAGNOSIS — R1084 Generalized abdominal pain: Secondary | ICD-10-CM | POA: Diagnosis not present

## 2020-04-22 DIAGNOSIS — Z903 Acquired absence of stomach [part of]: Secondary | ICD-10-CM | POA: Diagnosis not present

## 2020-04-22 DIAGNOSIS — Z9049 Acquired absence of other specified parts of digestive tract: Secondary | ICD-10-CM | POA: Diagnosis not present

## 2020-04-22 DIAGNOSIS — R197 Diarrhea, unspecified: Secondary | ICD-10-CM | POA: Diagnosis not present

## 2020-04-22 DIAGNOSIS — R110 Nausea: Secondary | ICD-10-CM | POA: Diagnosis not present

## 2020-04-23 ENCOUNTER — Encounter (HOSPITAL_BASED_OUTPATIENT_CLINIC_OR_DEPARTMENT_OTHER): Payer: Medicare Other | Admitting: Internal Medicine

## 2020-04-23 DIAGNOSIS — K219 Gastro-esophageal reflux disease without esophagitis: Secondary | ICD-10-CM | POA: Diagnosis not present

## 2020-04-23 DIAGNOSIS — Z86010 Personal history of colonic polyps: Secondary | ICD-10-CM | POA: Diagnosis not present

## 2020-04-23 DIAGNOSIS — R110 Nausea: Secondary | ICD-10-CM | POA: Diagnosis not present

## 2020-04-23 DIAGNOSIS — R197 Diarrhea, unspecified: Secondary | ICD-10-CM | POA: Diagnosis not present

## 2020-04-23 DIAGNOSIS — R6881 Early satiety: Secondary | ICD-10-CM | POA: Diagnosis not present

## 2020-04-23 DIAGNOSIS — R933 Abnormal findings on diagnostic imaging of other parts of digestive tract: Secondary | ICD-10-CM | POA: Diagnosis not present

## 2020-04-23 DIAGNOSIS — R1084 Generalized abdominal pain: Secondary | ICD-10-CM | POA: Diagnosis not present

## 2020-04-23 DIAGNOSIS — K625 Hemorrhage of anus and rectum: Secondary | ICD-10-CM | POA: Diagnosis not present

## 2020-04-24 DIAGNOSIS — R1013 Epigastric pain: Secondary | ICD-10-CM | POA: Diagnosis not present

## 2020-04-24 DIAGNOSIS — K529 Noninfective gastroenteritis and colitis, unspecified: Secondary | ICD-10-CM | POA: Diagnosis not present

## 2020-04-24 DIAGNOSIS — Z9884 Bariatric surgery status: Secondary | ICD-10-CM | POA: Diagnosis not present

## 2020-04-24 DIAGNOSIS — K319 Disease of stomach and duodenum, unspecified: Secondary | ICD-10-CM | POA: Diagnosis not present

## 2020-04-24 DIAGNOSIS — F6089 Other specific personality disorders: Secondary | ICD-10-CM | POA: Diagnosis not present

## 2020-04-25 DIAGNOSIS — F309 Manic episode, unspecified: Secondary | ICD-10-CM | POA: Diagnosis not present

## 2020-04-26 DIAGNOSIS — F309 Manic episode, unspecified: Secondary | ICD-10-CM | POA: Diagnosis not present

## 2020-04-27 DIAGNOSIS — F309 Manic episode, unspecified: Secondary | ICD-10-CM | POA: Diagnosis not present

## 2020-04-27 DIAGNOSIS — R159 Full incontinence of feces: Secondary | ICD-10-CM | POA: Diagnosis not present

## 2020-04-27 DIAGNOSIS — R188 Other ascites: Secondary | ICD-10-CM | POA: Diagnosis not present

## 2020-04-27 DIAGNOSIS — R109 Unspecified abdominal pain: Secondary | ICD-10-CM | POA: Diagnosis not present

## 2020-04-27 DIAGNOSIS — K529 Noninfective gastroenteritis and colitis, unspecified: Secondary | ICD-10-CM | POA: Diagnosis not present

## 2020-04-28 DIAGNOSIS — F309 Manic episode, unspecified: Secondary | ICD-10-CM | POA: Diagnosis not present

## 2020-04-28 DIAGNOSIS — K529 Noninfective gastroenteritis and colitis, unspecified: Secondary | ICD-10-CM | POA: Diagnosis not present

## 2020-04-28 DIAGNOSIS — R159 Full incontinence of feces: Secondary | ICD-10-CM | POA: Diagnosis not present

## 2020-04-28 DIAGNOSIS — R109 Unspecified abdominal pain: Secondary | ICD-10-CM | POA: Diagnosis not present

## 2020-04-29 DIAGNOSIS — R1084 Generalized abdominal pain: Secondary | ICD-10-CM | POA: Diagnosis not present

## 2020-04-29 DIAGNOSIS — S31109A Unspecified open wound of abdominal wall, unspecified quadrant without penetration into peritoneal cavity, initial encounter: Secondary | ICD-10-CM | POA: Diagnosis not present

## 2020-04-29 DIAGNOSIS — K219 Gastro-esophageal reflux disease without esophagitis: Secondary | ICD-10-CM | POA: Diagnosis not present

## 2020-04-29 DIAGNOSIS — R159 Full incontinence of feces: Secondary | ICD-10-CM | POA: Diagnosis not present

## 2020-04-29 DIAGNOSIS — R1031 Right lower quadrant pain: Secondary | ICD-10-CM | POA: Diagnosis not present

## 2020-04-29 DIAGNOSIS — K625 Hemorrhage of anus and rectum: Secondary | ICD-10-CM | POA: Diagnosis not present

## 2020-04-30 DIAGNOSIS — K219 Gastro-esophageal reflux disease without esophagitis: Secondary | ICD-10-CM | POA: Diagnosis not present

## 2020-04-30 DIAGNOSIS — R1031 Right lower quadrant pain: Secondary | ICD-10-CM | POA: Diagnosis not present

## 2020-04-30 DIAGNOSIS — R197 Diarrhea, unspecified: Secondary | ICD-10-CM | POA: Diagnosis not present

## 2020-04-30 DIAGNOSIS — R1084 Generalized abdominal pain: Secondary | ICD-10-CM | POA: Diagnosis not present

## 2020-04-30 DIAGNOSIS — R159 Full incontinence of feces: Secondary | ICD-10-CM | POA: Diagnosis not present

## 2020-05-01 DIAGNOSIS — R1031 Right lower quadrant pain: Secondary | ICD-10-CM | POA: Diagnosis not present

## 2020-05-01 DIAGNOSIS — K219 Gastro-esophageal reflux disease without esophagitis: Secondary | ICD-10-CM | POA: Diagnosis not present

## 2020-05-01 DIAGNOSIS — R197 Diarrhea, unspecified: Secondary | ICD-10-CM | POA: Diagnosis not present

## 2020-05-01 DIAGNOSIS — R159 Full incontinence of feces: Secondary | ICD-10-CM | POA: Diagnosis not present

## 2020-05-01 DIAGNOSIS — R1084 Generalized abdominal pain: Secondary | ICD-10-CM | POA: Diagnosis not present

## 2020-05-02 DIAGNOSIS — R1084 Generalized abdominal pain: Secondary | ICD-10-CM | POA: Diagnosis not present

## 2020-05-02 DIAGNOSIS — R197 Diarrhea, unspecified: Secondary | ICD-10-CM | POA: Diagnosis not present

## 2020-05-02 DIAGNOSIS — R1031 Right lower quadrant pain: Secondary | ICD-10-CM | POA: Diagnosis not present

## 2020-05-02 DIAGNOSIS — R159 Full incontinence of feces: Secondary | ICD-10-CM | POA: Diagnosis not present

## 2020-05-02 DIAGNOSIS — K219 Gastro-esophageal reflux disease without esophagitis: Secondary | ICD-10-CM | POA: Diagnosis not present

## 2020-05-08 DIAGNOSIS — F312 Bipolar disorder, current episode manic severe with psychotic features: Secondary | ICD-10-CM | POA: Diagnosis not present

## 2020-05-12 DIAGNOSIS — F17210 Nicotine dependence, cigarettes, uncomplicated: Secondary | ICD-10-CM | POA: Diagnosis not present

## 2020-05-12 DIAGNOSIS — M545 Low back pain, unspecified: Secondary | ICD-10-CM | POA: Diagnosis not present

## 2020-05-12 DIAGNOSIS — G8929 Other chronic pain: Secondary | ICD-10-CM | POA: Diagnosis not present

## 2020-05-16 DIAGNOSIS — E669 Obesity, unspecified: Secondary | ICD-10-CM | POA: Diagnosis not present

## 2020-05-16 DIAGNOSIS — S31109D Unspecified open wound of abdominal wall, unspecified quadrant without penetration into peritoneal cavity, subsequent encounter: Secondary | ICD-10-CM | POA: Diagnosis not present

## 2020-05-16 DIAGNOSIS — R4588 Nonsuicidal self-harm: Secondary | ICD-10-CM | POA: Diagnosis not present

## 2020-05-16 DIAGNOSIS — R7309 Other abnormal glucose: Secondary | ICD-10-CM | POA: Diagnosis not present

## 2020-05-16 DIAGNOSIS — Z7902 Long term (current) use of antithrombotics/antiplatelets: Secondary | ICD-10-CM | POA: Diagnosis not present

## 2020-05-16 DIAGNOSIS — F17200 Nicotine dependence, unspecified, uncomplicated: Secondary | ICD-10-CM | POA: Diagnosis not present

## 2020-05-16 DIAGNOSIS — Z046 Encounter for general psychiatric examination, requested by authority: Secondary | ICD-10-CM | POA: Diagnosis not present

## 2020-05-16 DIAGNOSIS — F319 Bipolar disorder, unspecified: Secondary | ICD-10-CM | POA: Diagnosis not present

## 2020-05-16 DIAGNOSIS — I10 Essential (primary) hypertension: Secondary | ICD-10-CM | POA: Diagnosis not present

## 2020-05-16 DIAGNOSIS — Z6838 Body mass index (BMI) 38.0-38.9, adult: Secondary | ICD-10-CM | POA: Diagnosis not present

## 2020-05-16 DIAGNOSIS — Z20822 Contact with and (suspected) exposure to covid-19: Secondary | ICD-10-CM | POA: Diagnosis not present

## 2020-05-16 DIAGNOSIS — S31109A Unspecified open wound of abdominal wall, unspecified quadrant without penetration into peritoneal cavity, initial encounter: Secondary | ICD-10-CM | POA: Diagnosis not present

## 2020-05-16 DIAGNOSIS — R45851 Suicidal ideations: Secondary | ICD-10-CM | POA: Diagnosis not present

## 2020-05-16 DIAGNOSIS — E785 Hyperlipidemia, unspecified: Secondary | ICD-10-CM | POA: Diagnosis not present

## 2020-05-16 DIAGNOSIS — G4700 Insomnia, unspecified: Secondary | ICD-10-CM | POA: Diagnosis not present

## 2020-05-17 DIAGNOSIS — F17200 Nicotine dependence, unspecified, uncomplicated: Secondary | ICD-10-CM | POA: Diagnosis not present

## 2020-05-17 DIAGNOSIS — I10 Essential (primary) hypertension: Secondary | ICD-10-CM | POA: Diagnosis not present

## 2020-05-17 DIAGNOSIS — Z046 Encounter for general psychiatric examination, requested by authority: Secondary | ICD-10-CM | POA: Diagnosis not present

## 2020-05-17 DIAGNOSIS — Z7902 Long term (current) use of antithrombotics/antiplatelets: Secondary | ICD-10-CM | POA: Diagnosis not present

## 2020-05-17 DIAGNOSIS — S31109A Unspecified open wound of abdominal wall, unspecified quadrant without penetration into peritoneal cavity, initial encounter: Secondary | ICD-10-CM | POA: Diagnosis not present

## 2020-05-18 DIAGNOSIS — F312 Bipolar disorder, current episode manic severe with psychotic features: Secondary | ICD-10-CM | POA: Diagnosis not present

## 2020-05-18 DIAGNOSIS — F419 Anxiety disorder, unspecified: Secondary | ICD-10-CM | POA: Diagnosis not present

## 2020-05-18 DIAGNOSIS — F17200 Nicotine dependence, unspecified, uncomplicated: Secondary | ICD-10-CM | POA: Diagnosis not present

## 2020-05-18 DIAGNOSIS — F909 Attention-deficit hyperactivity disorder, unspecified type: Secondary | ICD-10-CM | POA: Diagnosis not present

## 2020-05-18 DIAGNOSIS — F6089 Other specific personality disorders: Secondary | ICD-10-CM | POA: Diagnosis not present

## 2020-05-20 DIAGNOSIS — R918 Other nonspecific abnormal finding of lung field: Secondary | ICD-10-CM | POA: Diagnosis not present

## 2020-05-20 DIAGNOSIS — R590 Localized enlarged lymph nodes: Secondary | ICD-10-CM | POA: Diagnosis not present

## 2020-05-22 DIAGNOSIS — F6089 Other specific personality disorders: Secondary | ICD-10-CM | POA: Diagnosis not present

## 2020-05-22 DIAGNOSIS — F419 Anxiety disorder, unspecified: Secondary | ICD-10-CM | POA: Diagnosis not present

## 2020-05-22 DIAGNOSIS — F312 Bipolar disorder, current episode manic severe with psychotic features: Secondary | ICD-10-CM | POA: Diagnosis not present

## 2020-05-22 DIAGNOSIS — F902 Attention-deficit hyperactivity disorder, combined type: Secondary | ICD-10-CM | POA: Diagnosis not present

## 2020-05-22 DIAGNOSIS — F17200 Nicotine dependence, unspecified, uncomplicated: Secondary | ICD-10-CM | POA: Diagnosis not present

## 2020-05-23 DIAGNOSIS — F312 Bipolar disorder, current episode manic severe with psychotic features: Secondary | ICD-10-CM | POA: Diagnosis not present

## 2020-05-23 DIAGNOSIS — F6089 Other specific personality disorders: Secondary | ICD-10-CM | POA: Diagnosis not present

## 2020-05-31 DIAGNOSIS — F3160 Bipolar disorder, current episode mixed, unspecified: Secondary | ICD-10-CM | POA: Diagnosis not present

## 2020-06-01 DIAGNOSIS — F3160 Bipolar disorder, current episode mixed, unspecified: Secondary | ICD-10-CM | POA: Diagnosis not present

## 2020-06-02 DIAGNOSIS — F3160 Bipolar disorder, current episode mixed, unspecified: Secondary | ICD-10-CM | POA: Diagnosis not present

## 2020-06-03 DIAGNOSIS — F332 Major depressive disorder, recurrent severe without psychotic features: Secondary | ICD-10-CM | POA: Diagnosis not present

## 2020-06-03 DIAGNOSIS — D649 Anemia, unspecified: Secondary | ICD-10-CM | POA: Diagnosis not present

## 2020-06-04 DIAGNOSIS — F3160 Bipolar disorder, current episode mixed, unspecified: Secondary | ICD-10-CM | POA: Diagnosis not present

## 2020-06-04 DIAGNOSIS — F312 Bipolar disorder, current episode manic severe with psychotic features: Secondary | ICD-10-CM | POA: Diagnosis not present

## 2020-06-05 DIAGNOSIS — Z6835 Body mass index (BMI) 35.0-35.9, adult: Secondary | ICD-10-CM | POA: Diagnosis not present

## 2020-06-05 DIAGNOSIS — E669 Obesity, unspecified: Secondary | ICD-10-CM | POA: Diagnosis not present

## 2020-06-05 DIAGNOSIS — F332 Major depressive disorder, recurrent severe without psychotic features: Secondary | ICD-10-CM | POA: Diagnosis not present

## 2020-06-06 DIAGNOSIS — F3160 Bipolar disorder, current episode mixed, unspecified: Secondary | ICD-10-CM | POA: Diagnosis not present

## 2020-06-07 DIAGNOSIS — F3160 Bipolar disorder, current episode mixed, unspecified: Secondary | ICD-10-CM | POA: Diagnosis not present

## 2020-06-07 DIAGNOSIS — D869 Sarcoidosis, unspecified: Secondary | ICD-10-CM | POA: Diagnosis not present

## 2020-06-07 DIAGNOSIS — L03113 Cellulitis of right upper limb: Secondary | ICD-10-CM | POA: Diagnosis not present

## 2020-06-07 DIAGNOSIS — F319 Bipolar disorder, unspecified: Secondary | ICD-10-CM | POA: Diagnosis not present

## 2020-06-07 DIAGNOSIS — I808 Phlebitis and thrombophlebitis of other sites: Secondary | ICD-10-CM | POA: Diagnosis not present

## 2020-06-07 DIAGNOSIS — I10 Essential (primary) hypertension: Secondary | ICD-10-CM | POA: Diagnosis not present

## 2020-06-07 DIAGNOSIS — F6089 Other specific personality disorders: Secondary | ICD-10-CM | POA: Diagnosis not present

## 2020-06-08 DIAGNOSIS — F3160 Bipolar disorder, current episode mixed, unspecified: Secondary | ICD-10-CM | POA: Diagnosis not present

## 2020-06-08 DIAGNOSIS — F6089 Other specific personality disorders: Secondary | ICD-10-CM | POA: Diagnosis not present

## 2020-06-09 DIAGNOSIS — F6089 Other specific personality disorders: Secondary | ICD-10-CM | POA: Diagnosis not present

## 2020-06-09 DIAGNOSIS — F3160 Bipolar disorder, current episode mixed, unspecified: Secondary | ICD-10-CM | POA: Diagnosis not present

## 2020-06-10 ENCOUNTER — Other Ambulatory Visit: Payer: Self-pay | Admitting: Gastroenterology

## 2020-06-10 DIAGNOSIS — Z6835 Body mass index (BMI) 35.0-35.9, adult: Secondary | ICD-10-CM | POA: Diagnosis not present

## 2020-06-10 DIAGNOSIS — D509 Iron deficiency anemia, unspecified: Secondary | ICD-10-CM

## 2020-06-10 DIAGNOSIS — E669 Obesity, unspecified: Secondary | ICD-10-CM | POA: Diagnosis not present

## 2020-06-10 DIAGNOSIS — R195 Other fecal abnormalities: Secondary | ICD-10-CM

## 2020-06-10 DIAGNOSIS — F332 Major depressive disorder, recurrent severe without psychotic features: Secondary | ICD-10-CM | POA: Diagnosis not present

## 2020-06-12 ENCOUNTER — Telehealth: Payer: Self-pay | Admitting: Family Medicine

## 2020-06-12 DIAGNOSIS — F3160 Bipolar disorder, current episode mixed, unspecified: Secondary | ICD-10-CM | POA: Diagnosis not present

## 2020-06-12 DIAGNOSIS — F312 Bipolar disorder, current episode manic severe with psychotic features: Secondary | ICD-10-CM | POA: Diagnosis not present

## 2020-06-12 NOTE — Telephone Encounter (Signed)
Received fax from [place]. pharmacy requesting refill on valACYclovir (VALTREX) 500 MG tablet QTY: 90 2 refills

## 2020-06-13 MED ORDER — VALACYCLOVIR HCL 500 MG PO TABS: 500.0000 mg | ORAL_TABLET | Freq: Every day | ORAL | 2 refills | Status: DC | PRN

## 2020-06-13 NOTE — Telephone Encounter (Signed)
Prescription sent to pharmacy.

## 2020-06-14 DIAGNOSIS — D649 Anemia, unspecified: Secondary | ICD-10-CM | POA: Diagnosis not present

## 2020-06-14 DIAGNOSIS — F332 Major depressive disorder, recurrent severe without psychotic features: Secondary | ICD-10-CM | POA: Diagnosis not present

## 2020-06-15 DIAGNOSIS — F6089 Other specific personality disorders: Secondary | ICD-10-CM | POA: Diagnosis not present

## 2020-06-15 DIAGNOSIS — F250 Schizoaffective disorder, bipolar type: Secondary | ICD-10-CM | POA: Diagnosis not present

## 2020-06-15 DIAGNOSIS — F419 Anxiety disorder, unspecified: Secondary | ICD-10-CM | POA: Diagnosis not present

## 2020-06-15 DIAGNOSIS — F3160 Bipolar disorder, current episode mixed, unspecified: Secondary | ICD-10-CM | POA: Diagnosis not present

## 2020-06-15 DIAGNOSIS — F17200 Nicotine dependence, unspecified, uncomplicated: Secondary | ICD-10-CM | POA: Diagnosis not present

## 2020-06-15 DIAGNOSIS — F312 Bipolar disorder, current episode manic severe with psychotic features: Secondary | ICD-10-CM | POA: Diagnosis not present

## 2020-06-15 DIAGNOSIS — F902 Attention-deficit hyperactivity disorder, combined type: Secondary | ICD-10-CM | POA: Diagnosis not present

## 2020-06-16 DIAGNOSIS — F6089 Other specific personality disorders: Secondary | ICD-10-CM | POA: Diagnosis not present

## 2020-06-16 DIAGNOSIS — F250 Schizoaffective disorder, bipolar type: Secondary | ICD-10-CM | POA: Diagnosis not present

## 2020-06-16 DIAGNOSIS — F17200 Nicotine dependence, unspecified, uncomplicated: Secondary | ICD-10-CM | POA: Diagnosis not present

## 2020-06-16 DIAGNOSIS — F902 Attention-deficit hyperactivity disorder, combined type: Secondary | ICD-10-CM | POA: Diagnosis not present

## 2020-06-16 DIAGNOSIS — F3160 Bipolar disorder, current episode mixed, unspecified: Secondary | ICD-10-CM | POA: Diagnosis not present

## 2020-06-16 DIAGNOSIS — F312 Bipolar disorder, current episode manic severe with psychotic features: Secondary | ICD-10-CM | POA: Diagnosis not present

## 2020-06-16 DIAGNOSIS — F419 Anxiety disorder, unspecified: Secondary | ICD-10-CM | POA: Diagnosis not present

## 2020-06-17 DIAGNOSIS — F3160 Bipolar disorder, current episode mixed, unspecified: Secondary | ICD-10-CM | POA: Diagnosis not present

## 2020-06-17 DIAGNOSIS — F312 Bipolar disorder, current episode manic severe with psychotic features: Secondary | ICD-10-CM | POA: Diagnosis not present

## 2020-06-24 DIAGNOSIS — F3132 Bipolar disorder, current episode depressed, moderate: Secondary | ICD-10-CM | POA: Diagnosis not present

## 2020-06-27 DIAGNOSIS — F312 Bipolar disorder, current episode manic severe with psychotic features: Secondary | ICD-10-CM | POA: Diagnosis not present

## 2020-06-27 DIAGNOSIS — Z6835 Body mass index (BMI) 35.0-35.9, adult: Secondary | ICD-10-CM | POA: Diagnosis not present

## 2020-06-27 DIAGNOSIS — F250 Schizoaffective disorder, bipolar type: Secondary | ICD-10-CM | POA: Diagnosis not present

## 2020-06-27 DIAGNOSIS — Z720 Tobacco use: Secondary | ICD-10-CM | POA: Diagnosis not present

## 2020-06-27 DIAGNOSIS — K2100 Gastro-esophageal reflux disease with esophagitis, without bleeding: Secondary | ICD-10-CM | POA: Diagnosis not present

## 2020-06-27 DIAGNOSIS — Z0289 Encounter for other administrative examinations: Secondary | ICD-10-CM | POA: Diagnosis not present

## 2020-06-27 DIAGNOSIS — S31109S Unspecified open wound of abdominal wall, unspecified quadrant without penetration into peritoneal cavity, sequela: Secondary | ICD-10-CM | POA: Diagnosis not present

## 2020-06-27 DIAGNOSIS — E782 Mixed hyperlipidemia: Secondary | ICD-10-CM | POA: Diagnosis not present

## 2020-06-30 DIAGNOSIS — F250 Schizoaffective disorder, bipolar type: Secondary | ICD-10-CM | POA: Diagnosis not present

## 2020-06-30 DIAGNOSIS — K219 Gastro-esophageal reflux disease without esophagitis: Secondary | ICD-10-CM | POA: Diagnosis not present

## 2020-06-30 DIAGNOSIS — T8189XD Other complications of procedures, not elsewhere classified, subsequent encounter: Secondary | ICD-10-CM | POA: Diagnosis not present

## 2020-06-30 DIAGNOSIS — E782 Mixed hyperlipidemia: Secondary | ICD-10-CM | POA: Diagnosis not present

## 2020-07-03 ENCOUNTER — Other Ambulatory Visit: Payer: Medicare Other

## 2020-07-03 ENCOUNTER — Ambulatory Visit: Payer: Medicare Other | Admitting: Hematology and Oncology

## 2020-07-03 DIAGNOSIS — K219 Gastro-esophageal reflux disease without esophagitis: Secondary | ICD-10-CM | POA: Diagnosis not present

## 2020-07-03 DIAGNOSIS — F250 Schizoaffective disorder, bipolar type: Secondary | ICD-10-CM | POA: Diagnosis not present

## 2020-07-03 DIAGNOSIS — T8189XD Other complications of procedures, not elsewhere classified, subsequent encounter: Secondary | ICD-10-CM | POA: Diagnosis not present

## 2020-07-03 DIAGNOSIS — E782 Mixed hyperlipidemia: Secondary | ICD-10-CM | POA: Diagnosis not present

## 2020-07-08 DIAGNOSIS — F250 Schizoaffective disorder, bipolar type: Secondary | ICD-10-CM | POA: Diagnosis not present

## 2020-07-08 DIAGNOSIS — K219 Gastro-esophageal reflux disease without esophagitis: Secondary | ICD-10-CM | POA: Diagnosis not present

## 2020-07-08 DIAGNOSIS — T8189XD Other complications of procedures, not elsewhere classified, subsequent encounter: Secondary | ICD-10-CM | POA: Diagnosis not present

## 2020-07-08 DIAGNOSIS — E782 Mixed hyperlipidemia: Secondary | ICD-10-CM | POA: Diagnosis not present

## 2020-07-09 DIAGNOSIS — F312 Bipolar disorder, current episode manic severe with psychotic features: Secondary | ICD-10-CM | POA: Diagnosis not present

## 2020-07-11 DIAGNOSIS — K219 Gastro-esophageal reflux disease without esophagitis: Secondary | ICD-10-CM | POA: Diagnosis not present

## 2020-07-11 DIAGNOSIS — T8189XD Other complications of procedures, not elsewhere classified, subsequent encounter: Secondary | ICD-10-CM | POA: Diagnosis not present

## 2020-07-11 DIAGNOSIS — F250 Schizoaffective disorder, bipolar type: Secondary | ICD-10-CM | POA: Diagnosis not present

## 2020-07-11 DIAGNOSIS — E782 Mixed hyperlipidemia: Secondary | ICD-10-CM | POA: Diagnosis not present

## 2020-07-17 DIAGNOSIS — E785 Hyperlipidemia, unspecified: Secondary | ICD-10-CM | POA: Diagnosis not present

## 2020-07-17 DIAGNOSIS — F4310 Post-traumatic stress disorder, unspecified: Secondary | ICD-10-CM | POA: Diagnosis not present

## 2020-07-17 DIAGNOSIS — F302 Manic episode, severe with psychotic symptoms: Secondary | ICD-10-CM | POA: Diagnosis not present

## 2020-07-17 DIAGNOSIS — F902 Attention-deficit hyperactivity disorder, combined type: Secondary | ICD-10-CM | POA: Diagnosis not present

## 2020-07-17 DIAGNOSIS — K219 Gastro-esophageal reflux disease without esophagitis: Secondary | ICD-10-CM | POA: Diagnosis not present

## 2020-07-17 DIAGNOSIS — F3160 Bipolar disorder, current episode mixed, unspecified: Secondary | ICD-10-CM | POA: Diagnosis not present

## 2020-07-17 DIAGNOSIS — Z8673 Personal history of transient ischemic attack (TIA), and cerebral infarction without residual deficits: Secondary | ICD-10-CM | POA: Diagnosis not present

## 2020-07-17 DIAGNOSIS — E669 Obesity, unspecified: Secondary | ICD-10-CM | POA: Diagnosis not present

## 2020-07-17 DIAGNOSIS — Z6835 Body mass index (BMI) 35.0-35.9, adult: Secondary | ICD-10-CM | POA: Diagnosis not present

## 2020-07-17 DIAGNOSIS — F6089 Other specific personality disorders: Secondary | ICD-10-CM | POA: Diagnosis not present

## 2020-07-17 DIAGNOSIS — F312 Bipolar disorder, current episode manic severe with psychotic features: Secondary | ICD-10-CM | POA: Diagnosis not present

## 2020-07-17 DIAGNOSIS — I10 Essential (primary) hypertension: Secondary | ICD-10-CM | POA: Diagnosis not present

## 2020-07-17 DIAGNOSIS — F332 Major depressive disorder, recurrent severe without psychotic features: Secondary | ICD-10-CM | POA: Diagnosis not present

## 2020-07-17 DIAGNOSIS — Z720 Tobacco use: Secondary | ICD-10-CM | POA: Diagnosis not present

## 2020-07-17 DIAGNOSIS — F419 Anxiety disorder, unspecified: Secondary | ICD-10-CM | POA: Diagnosis not present

## 2020-07-17 DIAGNOSIS — Z7902 Long term (current) use of antithrombotics/antiplatelets: Secondary | ICD-10-CM | POA: Diagnosis not present

## 2020-07-18 DIAGNOSIS — T8189XD Other complications of procedures, not elsewhere classified, subsequent encounter: Secondary | ICD-10-CM | POA: Diagnosis not present

## 2020-07-18 DIAGNOSIS — K219 Gastro-esophageal reflux disease without esophagitis: Secondary | ICD-10-CM | POA: Diagnosis not present

## 2020-07-18 DIAGNOSIS — E782 Mixed hyperlipidemia: Secondary | ICD-10-CM | POA: Diagnosis not present

## 2020-07-18 DIAGNOSIS — F250 Schizoaffective disorder, bipolar type: Secondary | ICD-10-CM | POA: Diagnosis not present

## 2020-07-24 DIAGNOSIS — K219 Gastro-esophageal reflux disease without esophagitis: Secondary | ICD-10-CM | POA: Diagnosis not present

## 2020-07-24 DIAGNOSIS — T8189XD Other complications of procedures, not elsewhere classified, subsequent encounter: Secondary | ICD-10-CM | POA: Diagnosis not present

## 2020-07-24 DIAGNOSIS — F250 Schizoaffective disorder, bipolar type: Secondary | ICD-10-CM | POA: Diagnosis not present

## 2020-07-24 DIAGNOSIS — E782 Mixed hyperlipidemia: Secondary | ICD-10-CM | POA: Diagnosis not present

## 2020-07-30 DIAGNOSIS — T8189XD Other complications of procedures, not elsewhere classified, subsequent encounter: Secondary | ICD-10-CM | POA: Diagnosis not present

## 2020-07-30 DIAGNOSIS — Z0289 Encounter for other administrative examinations: Secondary | ICD-10-CM | POA: Diagnosis not present

## 2020-07-30 DIAGNOSIS — Z6835 Body mass index (BMI) 35.0-35.9, adult: Secondary | ICD-10-CM | POA: Diagnosis not present

## 2020-07-30 DIAGNOSIS — K219 Gastro-esophageal reflux disease without esophagitis: Secondary | ICD-10-CM | POA: Diagnosis not present

## 2020-07-30 DIAGNOSIS — F250 Schizoaffective disorder, bipolar type: Secondary | ICD-10-CM | POA: Diagnosis not present

## 2020-07-30 DIAGNOSIS — S31109S Unspecified open wound of abdominal wall, unspecified quadrant without penetration into peritoneal cavity, sequela: Secondary | ICD-10-CM | POA: Diagnosis not present

## 2020-07-30 DIAGNOSIS — E782 Mixed hyperlipidemia: Secondary | ICD-10-CM | POA: Diagnosis not present

## 2020-07-30 DIAGNOSIS — Z720 Tobacco use: Secondary | ICD-10-CM | POA: Diagnosis not present

## 2020-07-31 DIAGNOSIS — F250 Schizoaffective disorder, bipolar type: Secondary | ICD-10-CM | POA: Diagnosis not present

## 2020-07-31 DIAGNOSIS — K219 Gastro-esophageal reflux disease without esophagitis: Secondary | ICD-10-CM | POA: Diagnosis not present

## 2020-07-31 DIAGNOSIS — T8189XD Other complications of procedures, not elsewhere classified, subsequent encounter: Secondary | ICD-10-CM | POA: Diagnosis not present

## 2020-07-31 DIAGNOSIS — E782 Mixed hyperlipidemia: Secondary | ICD-10-CM | POA: Diagnosis not present

## 2020-08-05 DIAGNOSIS — K219 Gastro-esophageal reflux disease without esophagitis: Secondary | ICD-10-CM | POA: Diagnosis not present

## 2020-08-05 DIAGNOSIS — F250 Schizoaffective disorder, bipolar type: Secondary | ICD-10-CM | POA: Diagnosis not present

## 2020-08-05 DIAGNOSIS — E782 Mixed hyperlipidemia: Secondary | ICD-10-CM | POA: Diagnosis not present

## 2020-08-05 DIAGNOSIS — T8189XD Other complications of procedures, not elsewhere classified, subsequent encounter: Secondary | ICD-10-CM | POA: Diagnosis not present

## 2020-08-07 DIAGNOSIS — D649 Anemia, unspecified: Secondary | ICD-10-CM | POA: Diagnosis not present

## 2020-08-07 DIAGNOSIS — F332 Major depressive disorder, recurrent severe without psychotic features: Secondary | ICD-10-CM | POA: Diagnosis not present

## 2020-08-07 DIAGNOSIS — F312 Bipolar disorder, current episode manic severe with psychotic features: Secondary | ICD-10-CM | POA: Diagnosis not present

## 2020-08-08 DIAGNOSIS — K219 Gastro-esophageal reflux disease without esophagitis: Secondary | ICD-10-CM | POA: Diagnosis not present

## 2020-08-08 DIAGNOSIS — T8189XD Other complications of procedures, not elsewhere classified, subsequent encounter: Secondary | ICD-10-CM | POA: Diagnosis not present

## 2020-08-08 DIAGNOSIS — E782 Mixed hyperlipidemia: Secondary | ICD-10-CM | POA: Diagnosis not present

## 2020-08-08 DIAGNOSIS — F250 Schizoaffective disorder, bipolar type: Secondary | ICD-10-CM | POA: Diagnosis not present

## 2020-08-15 DIAGNOSIS — F3164 Bipolar disorder, current episode mixed, severe, with psychotic features: Secondary | ICD-10-CM | POA: Diagnosis not present

## 2020-08-15 DIAGNOSIS — R251 Tremor, unspecified: Secondary | ICD-10-CM | POA: Diagnosis not present

## 2020-08-15 DIAGNOSIS — F4010 Social phobia, unspecified: Secondary | ICD-10-CM | POA: Diagnosis not present

## 2020-08-16 ENCOUNTER — Encounter: Payer: Self-pay | Admitting: Family Medicine

## 2020-08-16 ENCOUNTER — Encounter: Payer: Self-pay | Admitting: *Deleted

## 2020-08-16 ENCOUNTER — Encounter: Payer: Self-pay | Admitting: Hematology and Oncology

## 2020-08-16 ENCOUNTER — Ambulatory Visit (INDEPENDENT_AMBULATORY_CARE_PROVIDER_SITE_OTHER): Payer: Medicare Other | Admitting: Family Medicine

## 2020-08-16 ENCOUNTER — Other Ambulatory Visit: Payer: Self-pay

## 2020-08-16 VITALS — BP 126/64 | HR 94 | Temp 97.9°F | Resp 16 | Ht 66.0 in | Wt 243.0 lb

## 2020-08-16 DIAGNOSIS — D869 Sarcoidosis, unspecified: Secondary | ICD-10-CM

## 2020-08-16 DIAGNOSIS — F3162 Bipolar disorder, current episode mixed, moderate: Secondary | ICD-10-CM

## 2020-08-16 DIAGNOSIS — Z79899 Other long term (current) drug therapy: Secondary | ICD-10-CM

## 2020-08-16 DIAGNOSIS — Z01419 Encounter for gynecological examination (general) (routine) without abnormal findings: Secondary | ICD-10-CM | POA: Diagnosis not present

## 2020-08-16 DIAGNOSIS — S31109S Unspecified open wound of abdominal wall, unspecified quadrant without penetration into peritoneal cavity, sequela: Secondary | ICD-10-CM

## 2020-08-16 NOTE — Progress Notes (Signed)
Subjective:    Patient ID: [name], female    DOB: November 23, 1975, 45 y.o.   MRN: [id number]  HPI 03/19/20 Patient request home health wound care.  She states that she needs a nurse to come to the group home that she is going to move to to do dressing changes.  Patient is evasive in her history.  I was aware that the patient had been to the emergency room on the 12th, the 13th, and the 14th.  One time was for a suicide attempt although she states it was an accidental overdose.  She did not volunteer this initially until I confronted her about it.  She states that she took medication by accident.  I then asked her which medication she was taking.  She has been seeing a psychiatrist, Dr. Erling Cruz.  She is also seeing a neurologist Dr. Merlene Laughter for pain management.  I was concerned which medication that she took.  Truthfully I am concerned that the patient is suffering from bipolar mania but I was trying to get from the history who is managing her medication to determine the best way of managing this.  The patient then states do not judge me.  "Promise you will judge me".  I explained to the patient that I am her doctor and that I am not here to judge her I am here simply to help her.  However also explained to the patient that I need her to be honest with me in order for me to be able to find the best way to treat her.  The patient then continually states that her mother is out to get her.  She states that her mother is sabotaging her medication.  Patient never took ownership of what happened or admitted she is the one that took the medication.  Ultimately, her grandmother is on hospice, and it sounds like the patient may have taken some of her morphine due to her pain in her abdomen.  Whether this was intentional or unintentional I am not certain.  Patient states that it was unintentional.  She did not intend to harm herself.  However she is very frustrated and mad with my line of questioning.  My personal  opinion is that the patient needs supervision.  Her medication must be administered to her as I do not feel that she has insight to control her medications and therefore could accidentally or intentionally take the wrong medication.  She is getting Valium 4 times a day from her psychiatrist in addition to tramadol for pain.  She is also on Seroquel.  She then reluctantly admits that her mother "dropped her off at [facility]" last week.  She states that she was having bipolar seizures and that they started her on Depakote.  However she does not know the dose or how much she is taking.  I feel that the patient needs medication supervision and a mood stabilizer.  However as I am asking her about who is supervising her medication, she states that she is going to be moving to a group home.  He states that I need to check her for COVID and to get her wound care to go to the group home.  I asked her what date that she is going to the group home and then she becomes very upset with me and leaves.  My intention in knowing the date is to try to determine who is administering her medication because I feel that she needs supervision.  However the patient refuses my help and leaves AGAINST MEDICAL ADVICE.  At that time, my plan was: Patient is evasive in answering my questions.  I explained to the patient that I feel she needs supervision and that someone needs to administer her medications to avoid unintentional or intentional overuse.  She is on benzodiazepines from her psychiatrist as well as pain medication from her pain specialist.  Furthermore she seems to be manic in my opinion.  She is frequently going to the emergency room.  Her anxiety is worse.  However I feel that she is not telling me the entire truth about the admission to [facility].  I suspect that they put her on Depakote as a mood stabilizer and not for seizures.  I feel patient needs supervision and that her medication should be administered by another  individual.  I am also concerned that she needs some type of mood stabilizer and follow-up with her psychiatrist.  I do not feel that she should self-administer her pain medication.  I was honest with the patient and told her this.  This made the patient extremely upset with me when I explained this to her and she left AGAINST MEDICAL ADVICE  08/16/20 Patient is here today with her mother.  Since I last saw the patient, she went to [place].  She left the area without notifying her mom.  She was then admitted to behavioral health somewhere in [place] and was hospitalized.  From what I gather from our conversation [place]  Her medications were stabilized and she started ECT treatments.  She then moved back home and she has just recently moved back home.  She is reestablished with Dr. Erling Cruz her psychiatrist locally who plans to resume ECT treatments.  Patient has not had any B12 injections since I last saw her.  She is interested in resuming B12 treatments.  She also wants to start with home health wound care.  She has a open abdominal wound above the umbilicus.  She gets daily wet-to-dry dressing changes.  I examined the area today.  There is an elliptical opening roughly 5 to 6 cm in length and about 2 cm in width which she has packed with saline impregnated gauze.  There is no evidence of cellulitis or purulent drainage.  There is serous drainage.  She also has a history of pulmonary sarcoidosis.  She is reestablish care with her local pulmonologist and has an appointment to see him in August.  She had a CAT scan while she was in [employer] that showed hilar lymphadenopathy as well as nodules in the liver and in the spleen but I feel that this is a reflection of her sarcoidosis. Past Medical History:  Diagnosis Date   ADHD    Anal fistula    Anxiety    Arthritis    pt denies 5/3 visit    Bipolar affective ([place])    Blood transfusion without  reported diagnosis    Colostomy in place [facility])    CVA (cerebrovascular accident) ([place])    left cerebellar infarct found accidentally on CT (2020)    Depression    Diverticulitis 2008   perforated/ requiring resection   Fatty liver    Gallstones    GERD (gastroesophageal reflux disease)    occ.   Headache    after a fall   Herpes simplex    History of colon polyps    History of hiatal hernia  small   History of kidney stones    cystoscopy  basket removal   Hypertension    Iron deficiency anemia    Obese    OCD (obsessive compulsive disorder)    Panic attacks    Pulmonary nodules    Sarcoidosis    Schizophrenia ([place])    Shortness of breath dyspnea    pt denies on 5/3 visit    Sleep apnea    mild, does not use c-pap machine   Substance abuse ([place])    12 years ago-crack cocaine   Current Outpatient Medications on File Prior to Visit  Medication Sig Dispense Refill   benztropine (COGENTIN) 2 MG tablet Take 2 mg by mouth 2 (two) times daily.     hydrOXYzine (ATARAX/VISTARIL) 50 MG tablet Take 50 mg by mouth every 3 (three) hours as needed.     QUEtiapine (SEROQUEL) 300 MG tablet Take 900 mg by mouth at bedtime.     risperiDONE (RISPERDAL) 2 MG tablet Take 2 mg by mouth in the morning, at noon, and at bedtime.     valACYclovir (VALTREX) 500 MG tablet Take 1 tablet (500 mg total) by mouth daily as needed (fever blister). 90 tablet 2   No current facility-administered medications on file prior to visit.     No Known Allergies Social History   Socioeconomic History   Marital status: Single    Spouse name: Not on file   Number of children: 2   Years of education: 12    Highest education level: Not on file  Occupational History   Occupation: Insurance claims handler: UNEMPLOYED  Tobacco Use   Smoking status: Former    Packs/day: 0.50    Years: 14.00    Pack years: 7.00    Types: E-cigarettes, Cigarettes    Quit date: 12/20/2013    Years since quitting: 6.6    Smokeless tobacco: Never   Tobacco comments:    Vapor cigarettes.  Vaping Use   Vaping Use: Former   Quit date: 02/24/2019   Substances: Nicotine, Flavoring   Devices: started 2015  Substance and Sexual Activity   Alcohol use: No    Alcohol/week: 0.0 standard drinks   Drug use: Never   Sexual activity: Not Currently    Birth control/protection: Abstinence  Other Topics Concern   Not on file  Social History Narrative   Not on file   Social Determinants of Health   Financial Resource Strain: Not on file  Food Insecurity: Not on file  Transportation Needs: Not on file  Physical Activity: Not on file  Stress: Not on file  Social Connections: Not on file  Intimate Partner Violence: Not on file     Review of Systems     Objective:   Physical Exam Vitals reviewed.  Constitutional:      Appearance: She is obese.  Cardiovascular:     Rate and Rhythm: Normal rate and regular rhythm.     Heart sounds: Normal heart sounds.  Pulmonary:     Effort: Pulmonary effort is normal.     Breath sounds: Normal breath sounds. No stridor. No rhonchi or rales.  Abdominal:     General: Abdomen is flat. Bowel sounds are normal. There is no distension.     Tenderness: There is no abdominal tenderness.    Musculoskeletal:     Cervical back: Normal range of motion and neck supple.  Neurological:     Mental Status: She is alert.  Psychiatric:  Attention and Perception: Attention normal.        Mood and Affect: Mood normal. Affect is not labile or angry.        Speech: Speech normal. Speech is not tangential.        Behavior: Behavior normal. Behavior is not agitated. Behavior is cooperative.        Thought Content: Thought content does not include suicidal ideation. Thought content does not include suicidal plan.        Cognition and Memory: Cognition and memory normal.          Assessment & Plan:  High risk medication use - Plan: CBC with Differential/Platelet, COMPLETE  METABOLIC PANEL WITH GFR, Hemoglobin A1c, Vitamin B12  Bipolar disorder, current episode mixed, moderate (H[facility])  Open wound of abdominal wall, sequela  Sarcoidosis Patient recently returned home from hospitalization at the [facility] [place].  Her medications have been changed.  She is now on Seroquel with Risperdal.  She has been taken off Valium and gabapentin.  From a mood standpoint she seems much more stable today than the last time I saw her.  I will defer the management of her bipolar disorder to her psychiatrist.  However due to the high risk nature of her medications and her obesity, I will check a CBC CMP and an A1c.  Given her history of B12 deficiency I will also check a vitamin B12 level and if low will begin repletion.  I will consult home health nursing to arrange wound care at home as she requires daily wet-to-dry dressing changes.  I believe the nodularity seen on the outside CT scan reflects her sarcoidosis and I encouraged the patient to follow-up as previously scheduled with her pulmonologist however at the present time she is asymptomatic

## 2020-08-17 LAB — COMPLETE METABOLIC PANEL WITH GFR
AG Ratio: 1.5 (calc) (ref 1.0–2.5)
ALT: 10 U/L (ref 6–29)
AST: 13 U/L (ref 10–30)
Albumin: 4 g/dL (ref 3.6–5.1)
Alkaline phosphatase (APISO): 102 U/L (ref 31–125)
BUN: 8 mg/dL (ref 7–25)
CO2: 27 mmol/L (ref 20–32)
Calcium: 9 mg/dL (ref 8.6–10.2)
Chloride: 103 mmol/L (ref 98–110)
Creat: 0.57 mg/dL (ref 0.50–1.10)
GFR, Est African American: 132 mL/min/{1.73_m2} (ref >=60)
GFR, Est Non African American: 114 mL/min/{1.73_m2} (ref >=60)
Globulin: 2.7 g/dL (calc) (ref 1.9–3.7)
Glucose, Bld: 79 mg/dL (ref 65–99)
Potassium: 4.2 mmol/L (ref 3.5–5.3)
Sodium: 139 mmol/L (ref 135–146)
Total Bilirubin: 0.2 mg/dL (ref 0.2–1.2)
Total Protein: 6.7 g/dL (ref 6.1–8.1)

## 2020-08-17 LAB — CBC WITH DIFFERENTIAL/PLATELET
Absolute Monocytes: 572 cells/uL (ref 200–950)
Basophils Absolute: 11 cells/uL (ref 0–200)
Basophils Relative: 0.2 %
Eosinophils Absolute: 408 cells/uL (ref 15–500)
Eosinophils Relative: 7.7 %
HCT: 34.6 % — ABNORMAL LOW (ref 35.0–45.0)
Hemoglobin: 11.4 g/dL — ABNORMAL LOW (ref 11.7–15.5)
Lymphs Abs: 1028 cells/uL (ref 850–3900)
MCH: 26.9 pg — ABNORMAL LOW (ref 27.0–33.0)
MCHC: 32.9 g/dL (ref 32.0–36.0)
MCV: 81.6 fL (ref 80.0–100.0)
MPV: 9.6 fL (ref 7.5–12.5)
Monocytes Relative: 10.8 %
Neutro Abs: 3281 cells/uL (ref 1500–7800)
Neutrophils Relative %: 61.9 %
Platelets: 373 10*3/uL (ref 140–400)
RBC: 4.24 10*6/uL (ref 3.80–5.10)
RDW: 12.8 % (ref 11.0–15.0)
Total Lymphocyte: 19.4 %
WBC: 5.3 10*3/uL (ref 3.8–10.8)

## 2020-08-17 LAB — HEMOGLOBIN A1C
Hgb A1c MFr Bld: 5.2 % of total Hgb (ref <5.7)
Mean Plasma Glucose: 103 mg/dL
eAG (mmol/L): 5.7 mmol/L

## 2020-08-17 LAB — VITAMIN B12: Vitamin B-12: 326 pg/mL (ref 200–1100)

## 2020-08-20 ENCOUNTER — Encounter: Payer: Self-pay | Admitting: *Deleted

## 2020-08-20 DIAGNOSIS — M545 Low back pain, unspecified: Secondary | ICD-10-CM | POA: Insufficient documentation

## 2020-08-20 DIAGNOSIS — Z6841 Body Mass Index (BMI) 40.0 and over, adult: Secondary | ICD-10-CM | POA: Diagnosis not present

## 2020-08-20 DIAGNOSIS — M5136 Other intervertebral disc degeneration, lumbar region: Secondary | ICD-10-CM | POA: Diagnosis not present

## 2020-08-27 ENCOUNTER — Ambulatory Visit: Payer: Medicare Other | Admitting: Family Medicine

## 2020-08-29 DIAGNOSIS — M47816 Spondylosis without myelopathy or radiculopathy, lumbar region: Secondary | ICD-10-CM | POA: Diagnosis not present

## 2020-09-09 DIAGNOSIS — R251 Tremor, unspecified: Secondary | ICD-10-CM | POA: Diagnosis not present

## 2020-09-09 DIAGNOSIS — F3164 Bipolar disorder, current episode mixed, severe, with psychotic features: Secondary | ICD-10-CM | POA: Diagnosis not present

## 2020-09-11 DIAGNOSIS — R9431 Abnormal electrocardiogram [ECG] [EKG]: Secondary | ICD-10-CM | POA: Diagnosis not present

## 2020-09-11 DIAGNOSIS — F329 Major depressive disorder, single episode, unspecified: Secondary | ICD-10-CM | POA: Diagnosis not present

## 2020-09-11 DIAGNOSIS — D860 Sarcoidosis of lung: Secondary | ICD-10-CM | POA: Diagnosis not present

## 2020-09-11 DIAGNOSIS — F250 Schizoaffective disorder, bipolar type: Secondary | ICD-10-CM | POA: Diagnosis not present

## 2020-09-11 DIAGNOSIS — F1320 Sedative, hypnotic or anxiolytic dependence, uncomplicated: Secondary | ICD-10-CM | POA: Insufficient documentation

## 2020-09-13 DIAGNOSIS — Z8719 Personal history of other diseases of the digestive system: Secondary | ICD-10-CM | POA: Insufficient documentation

## 2020-09-13 DIAGNOSIS — Z30430 Encounter for insertion of intrauterine contraceptive device: Secondary | ICD-10-CM | POA: Diagnosis not present

## 2020-09-17 DIAGNOSIS — M47896 Other spondylosis, lumbar region: Secondary | ICD-10-CM | POA: Diagnosis not present

## 2020-09-17 DIAGNOSIS — M47816 Spondylosis without myelopathy or radiculopathy, lumbar region: Secondary | ICD-10-CM | POA: Diagnosis not present

## 2020-09-23 DIAGNOSIS — D869 Sarcoidosis, unspecified: Secondary | ICD-10-CM | POA: Diagnosis not present

## 2020-09-23 DIAGNOSIS — F3164 Bipolar disorder, current episode mixed, severe, with psychotic features: Secondary | ICD-10-CM | POA: Diagnosis not present

## 2020-09-23 DIAGNOSIS — S31109D Unspecified open wound of abdominal wall, unspecified quadrant without penetration into peritoneal cavity, subsequent encounter: Secondary | ICD-10-CM | POA: Diagnosis not present

## 2020-09-24 DIAGNOSIS — R159 Full incontinence of feces: Secondary | ICD-10-CM | POA: Diagnosis not present

## 2020-09-24 DIAGNOSIS — R152 Fecal urgency: Secondary | ICD-10-CM | POA: Diagnosis not present

## 2020-09-26 ENCOUNTER — Encounter: Payer: Self-pay | Admitting: Emergency Medicine

## 2020-09-26 ENCOUNTER — Other Ambulatory Visit: Payer: Self-pay

## 2020-09-26 ENCOUNTER — Encounter: Payer: Self-pay | Admitting: Hematology and Oncology

## 2020-09-26 ENCOUNTER — Ambulatory Visit (INDEPENDENT_AMBULATORY_CARE_PROVIDER_SITE_OTHER): Payer: Medicare Other | Admitting: Emergency Medicine

## 2020-09-26 DIAGNOSIS — D869 Sarcoidosis, unspecified: Secondary | ICD-10-CM

## 2020-09-26 MED ORDER — ALBUTEROL SULFATE HFA 108 (90 BASE) MCG/ACT IN AERS: 2.0000 | INHALATION_SPRAY | Freq: Four times a day (QID) | RESPIRATORY_TRACT | 6 refills | Status: DC | PRN

## 2020-09-26 NOTE — Progress Notes (Signed)
Subjective:    Patient ID: [name], female    DOB: [birth year], 45 y.o.   MRN: [id number]  HPI  ROV 09/08/19 --45 year old woman, former smoker (currently vapes) review bipolar disease, diverticular perforation repair, colostomy and subcutaneous abscess/seroma that has been addressed by surgery now with a wound vac in place, OSA (not on CPAP), cerebellar CVA.  She underwent EBUS for mediastinal lymphadenopathy and bilateral pulmonary nodular disease 04/11/2019.  The pathology showed granulomatous inflammation consistent with sarcoidosis, culture negative.  She was treated for sarcoid flare in March 2021 with prednisone, cough improved. She is not having cough right, no SOB.   Currently on Protonix, off allegra.   Pulmonary function testing from 05/18/2019 reviewed by me, shows mixed restriction and obstruction, no bronchodilator response, normal diffusion capacity.  CT of her abdomen done 06/23/2019 reviewed by me, shows her percutaneous pigtail catheter in place, slight interval increase in size of her small residual seroma.  There are several small subcentimeter bilateral pulmonary nodules largest 7 mm in the right lower lobe, unchanged compared with prior.  Most recent CT scan of the chest was done 03/17/2019.   ROV 09/26/20 --this is a follow-up visit 45 year old woman diagnosed with sarcoidosis by endobronchial ultrasound 03/2019, mixed obstruction and restriction on pulmonary function testing 04/2019.  She also has a past medical history significant for bipolar, diverticular perforation requiring repair and colostomy complicated by subcutaneous abscess/seroma.  OSA not on CPAP, cerebellar CVA.  She had some increased cough, hemoptysis in November 2021 that prompted CT-PA.  No pulmonary embolism, stable bilateral pulmonary nodular disease, some increased hilar mediastinal adenopathy.  She was treated with antibiotics and prednisone with some improvement.  That was her last imaging of the chest.   She is not currently on any bronchodilator therapy. She still has a wound vac and may need repeat sgy for this. She has also been in/out of Behavioral Health She reports that her functional capacity is good, has good functional capacity. She has been vaping, is interested in stopping. She knows that her sx are more labile in the Fall - more SOB, cough.    Review of Systems As per HPI     Objective:   Physical Exam  Vitals:   09/26/20 1011  BP: 130/76  Pulse: 93  Temp: 98.1 F (36.7 C)  TempSrc: Oral  SpO2: 97%  Weight: 246 lb 9.6 oz (111.9 kg)  Height: '5\' 7"'$  (1.702 m)   Gen: Pleasant, obese woman, in no distress  ENT: No lesions,  mouth clear,  oropharynx clear, narrow post pharynx, no postnasal drip  Neck: No JVD, no stridor, strong voice  Lungs: No use of accessory muscles, no crackles or wheezing on normal respiration, no wheeze on forced expiration  Cardiovascular: RRR, heart sounds normal, no murmur or gallops, no peripheral edema  Abd: She has a wound VAC in place  Musculoskeletal: No deformities, no cyanosis or clubbing  Neuro: alert, awake, some pressured speech, tangential but redirects.   Skin: Warm, no lesions or rash      Assessment & Plan:  Sarcoidosis Appears to be quiet at this time.  She is asymptomatic, CT chest from November was for the most part stable.  I think she needs a repeat surveillance CT in November 2022 to look for interval stability.  She has obstruction and restriction on pulmonary function testing, is not on scheduled bronchodilators at this time.  Her symptoms come in the fall allergy season and are largely upper airway.  I would like for her to have albuterol to use if needed.  In the fall we may need to ramp up allergy therapy to control symptoms.  I will see her in November.  Baltazar Apo, MD, PhD 09/26/2020, 10:50 AM [facility] [phone] or if no answer [phone]

## 2020-09-26 NOTE — Addendum Note (Signed)
Addended by: Gavin Potters R on: 09/26/2020 01:07 PM   Modules accepted: Orders

## 2020-09-26 NOTE — Patient Instructions (Signed)
We will repeat your CT scan of the chest without contrast in November 2022 to follow your sarcoidosis We will prescribe an albuterol inhaler for you to keep available.  You can use 2 puffs up to every 4 hours if needed for shortness of breath, chest tightness, wheezing. Follow Dr. Lamonte Sakai in November after CT so that we can review the results together or sooner if you have any change in symptoms including worsening cough, shortness of breath.

## 2020-09-26 NOTE — Assessment & Plan Note (Signed)
Appears to be quiet at this time.  She is asymptomatic, CT chest from November was for the most part stable.  I think she needs a repeat surveillance CT in November 2022 to look for interval stability.  She has obstruction and restriction on pulmonary function testing, is not on scheduled bronchodilators at this time.  Her symptoms come in the fall allergy season and are largely upper airway.  I would like for her to have albuterol to use if needed.  In the fall we may need to ramp up allergy therapy to control symptoms.  I will see her in November.

## 2020-10-01 DIAGNOSIS — F3164 Bipolar disorder, current episode mixed, severe, with psychotic features: Secondary | ICD-10-CM | POA: Diagnosis not present

## 2020-10-01 DIAGNOSIS — S31109D Unspecified open wound of abdominal wall, unspecified quadrant without penetration into peritoneal cavity, subsequent encounter: Secondary | ICD-10-CM | POA: Diagnosis not present

## 2020-10-01 DIAGNOSIS — D869 Sarcoidosis, unspecified: Secondary | ICD-10-CM | POA: Diagnosis not present

## 2020-10-04 DIAGNOSIS — Z9889 Other specified postprocedural states: Secondary | ICD-10-CM | POA: Diagnosis not present

## 2020-10-04 DIAGNOSIS — Z9884 Bariatric surgery status: Secondary | ICD-10-CM | POA: Diagnosis not present

## 2020-10-04 DIAGNOSIS — R159 Full incontinence of feces: Secondary | ICD-10-CM | POA: Diagnosis not present

## 2020-10-04 DIAGNOSIS — Z01818 Encounter for other preprocedural examination: Secondary | ICD-10-CM | POA: Diagnosis not present

## 2020-10-04 DIAGNOSIS — D539 Nutritional anemia, unspecified: Secondary | ICD-10-CM | POA: Diagnosis not present

## 2020-10-04 DIAGNOSIS — S31109S Unspecified open wound of abdominal wall, unspecified quadrant without penetration into peritoneal cavity, sequela: Secondary | ICD-10-CM | POA: Diagnosis not present

## 2020-10-04 DIAGNOSIS — Z8719 Personal history of other diseases of the digestive system: Secondary | ICD-10-CM | POA: Diagnosis not present

## 2020-10-04 DIAGNOSIS — I10 Essential (primary) hypertension: Secondary | ICD-10-CM | POA: Diagnosis not present

## 2020-10-04 DIAGNOSIS — Z8673 Personal history of transient ischemic attack (TIA), and cerebral infarction without residual deficits: Secondary | ICD-10-CM | POA: Diagnosis not present

## 2020-10-04 DIAGNOSIS — F902 Attention-deficit hyperactivity disorder, combined type: Secondary | ICD-10-CM | POA: Diagnosis not present

## 2020-10-04 DIAGNOSIS — F250 Schizoaffective disorder, bipolar type: Secondary | ICD-10-CM | POA: Diagnosis not present

## 2020-10-04 DIAGNOSIS — G4733 Obstructive sleep apnea (adult) (pediatric): Secondary | ICD-10-CM | POA: Insufficient documentation

## 2020-10-04 DIAGNOSIS — R9431 Abnormal electrocardiogram [ECG] [EKG]: Secondary | ICD-10-CM | POA: Diagnosis not present

## 2020-10-04 DIAGNOSIS — F17200 Nicotine dependence, unspecified, uncomplicated: Secondary | ICD-10-CM | POA: Diagnosis not present

## 2020-10-04 DIAGNOSIS — R112 Nausea with vomiting, unspecified: Secondary | ICD-10-CM

## 2020-10-04 DIAGNOSIS — D860 Sarcoidosis of lung: Secondary | ICD-10-CM | POA: Diagnosis not present

## 2020-10-04 DIAGNOSIS — K219 Gastro-esophageal reflux disease without esophagitis: Secondary | ICD-10-CM | POA: Diagnosis not present

## 2020-10-04 HISTORY — DX: Other specified postprocedural states: Z98.890

## 2020-10-04 HISTORY — DX: Nausea with vomiting, unspecified: R11.2

## 2020-10-05 DIAGNOSIS — R000 Tachycardia, unspecified: Secondary | ICD-10-CM | POA: Diagnosis not present

## 2020-10-05 DIAGNOSIS — R1084 Generalized abdominal pain: Secondary | ICD-10-CM | POA: Diagnosis not present

## 2020-10-08 DIAGNOSIS — S31109D Unspecified open wound of abdominal wall, unspecified quadrant without penetration into peritoneal cavity, subsequent encounter: Secondary | ICD-10-CM | POA: Diagnosis not present

## 2020-10-08 DIAGNOSIS — D869 Sarcoidosis, unspecified: Secondary | ICD-10-CM | POA: Diagnosis not present

## 2020-10-08 DIAGNOSIS — F3164 Bipolar disorder, current episode mixed, severe, with psychotic features: Secondary | ICD-10-CM | POA: Diagnosis not present

## 2020-10-09 DIAGNOSIS — Z79899 Other long term (current) drug therapy: Secondary | ICD-10-CM | POA: Diagnosis not present

## 2020-10-09 DIAGNOSIS — I10 Essential (primary) hypertension: Secondary | ICD-10-CM | POA: Diagnosis not present

## 2020-10-09 DIAGNOSIS — F250 Schizoaffective disorder, bipolar type: Secondary | ICD-10-CM | POA: Diagnosis not present

## 2020-10-14 ENCOUNTER — Ambulatory Visit (INDEPENDENT_AMBULATORY_CARE_PROVIDER_SITE_OTHER): Payer: Medicare Other | Admitting: Gastroenterology

## 2020-10-14 ENCOUNTER — Other Ambulatory Visit (INDEPENDENT_AMBULATORY_CARE_PROVIDER_SITE_OTHER): Payer: Medicare Other

## 2020-10-14 ENCOUNTER — Encounter: Payer: Self-pay | Admitting: Gastroenterology

## 2020-10-14 VITALS — BP 114/80 | HR 108 | Ht 65.25 in | Wt 253.5 lb

## 2020-10-14 DIAGNOSIS — R197 Diarrhea, unspecified: Secondary | ICD-10-CM

## 2020-10-14 DIAGNOSIS — E739 Lactose intolerance, unspecified: Secondary | ICD-10-CM

## 2020-10-14 DIAGNOSIS — R194 Change in bowel habit: Secondary | ICD-10-CM

## 2020-10-14 LAB — TSH: TSH: 0.86 u[IU]/mL (ref 0.35–5.50)

## 2020-10-14 NOTE — Progress Notes (Signed)
    History of Present Illness: This is a 45 year old who relates a change in bowel habits with persistent diarrhea.  She states during a psychiatric hospitalization earlier this year she developed diarrhea.  Her diarrhea was exacerbated by a psychiatric medication which was stopped and her diarrhea improved but has persisted. She relates diarrhea 4-5 times a day.  She denies rectal bleeding or abdominal pain.  She denies any recent antibiotic usage.  She notes that milk products worsen intestinal gas and diarrhea so she has recently discontinued them.  Her intestinal gas has substantially improved her diarrhea has only minimally improved.  She uses artificial sweeteners regularly.  She notes a few specific foods such as egg rolls and cabbage exacerbate her diarrhea.  Her bowel habits previously were constipation for many years.  Current Medications, Allergies, Past Medical History, Past Surgical History, Family History and Social History were reviewed in [employer] record.   Physical Exam: General: Well developed, well nourished, no acute distress Head: Normocephalic and atraumatic Eyes: Sclerae anicteric, EOMI Ears: Normal auditory acuity Mouth: Not examined, mask on during Covid-19 pandemic Lungs: Clear throughout to auscultation Heart: Regular rate and rhythm; no murmurs, rubs or bruits Abdomen: Soft, non tender, protuberant and non distended. Large dry mild abdominal dressing with Wound Vac in mid abdomen. No masses, hepatosplenomegaly or hernias noted. Normal Bowel sounds Rectal: Not done Musculoskeletal: Symmetrical with no gross deformities  Pulses:  Normal pulses noted Extremities: No clubbing, cyanosis, edema or deformities noted Neurological: Alert oriented x 4, grossly nonfocal Psychological:  Alert and cooperative. Normal mood and affect   Assessment and Recommendations:  Diarrhea, change in bowel habits.  Lactose intolerance.  Rule out infectious  etiologies, celiac disease, and other disorders.  Discontinue all lactose products.  Discontinue use of artificial sweeteners for 1 week and assess response.  Stool GI profile, fecal elastase.  Send TSH, IgA, tTG.  Imodium 1-2 p.o. 3 times daily as needed. REV in 4 weeks with me or Tye Savoy, NP. IDA. Hgb=11.8.  Resume FeSO4 325 mg p.o. daily for 2 months and repeat CBC. Chronic abdominal wall seroma with a Wound VAC in place.  She is followed by [facility].  She relates that she needs to lose at least 40 pounds to undergo surgical repair. History of CVA maintained on Plavix. S/P gastric sleeve.  History of perforated diverticulitis status post surgical resection. Bipolar disorder, schizophrenia.  She recently started ECT at [facility].

## 2020-10-14 NOTE — Patient Instructions (Signed)
If you are age 44 or younger, your body mass index should be between 19-25. Your Body mass index is 41.86 kg/m. If this is out of the aformentioned range listed, please consider follow up with your Primary Care Provider.   __________________________________________________________  The [facility] providers would like to encourage you to use [facility] to communicate with providers for non-urgent requests or questions.  Due to long hold times on the telephone, sending your provider a message by [facility] may be a faster and more efficient way to get a response.  Please allow 48 business hours for a response.  Please remember that this is for non-urgent requests.   Your provider has requested that you go to the basement level for lab work before leaving today. Press "B" on the elevator. The lab is located at the first door on the left as you exit the elevator.  Due to recent changes in healthcare laws, you may see the results of your imaging and laboratory studies on MyChart before your provider has had a chance to review them.  We understand that in some cases there may be results that are confusing or concerning to you. Not all laboratory results come back in the same time frame and the provider may be waiting for multiple results in order to interpret others.  Please give [place] 48 hours in order for your provider to thoroughly review all the results before contacting the office for clarification of your results.   Please purchase the following medications over the counter and take as directed:  START: Iron 325 mg once daily for 2 months.   Continue lactose free diet.  Please discontinue all artifical sweeteners for 1 week.  Thank you for entrusting me with your care and choosing [facility].  Dr Fuller Plan

## 2020-10-15 ENCOUNTER — Other Ambulatory Visit: Payer: Medicare Other

## 2020-10-15 DIAGNOSIS — E739 Lactose intolerance, unspecified: Secondary | ICD-10-CM

## 2020-10-15 DIAGNOSIS — R197 Diarrhea, unspecified: Secondary | ICD-10-CM | POA: Diagnosis not present

## 2020-10-15 LAB — TISSUE TRANSGLUTAMINASE, IGA: (tTG) Ab, IgA: <1 U/mL

## 2020-10-15 LAB — IGA: Immunoglobulin A: 244 mg/dL (ref 47–310)

## 2020-10-18 LAB — GI PROFILE, STOOL, PCR

## 2020-10-22 DIAGNOSIS — R251 Tremor, unspecified: Secondary | ICD-10-CM | POA: Diagnosis not present

## 2020-10-22 DIAGNOSIS — F4010 Social phobia, unspecified: Secondary | ICD-10-CM | POA: Diagnosis not present

## 2020-10-22 DIAGNOSIS — F3164 Bipolar disorder, current episode mixed, severe, with psychotic features: Secondary | ICD-10-CM | POA: Diagnosis not present

## 2020-10-23 LAB — PANCREATIC ELASTASE, FECAL: Pancreatic Elastase-1, Stool: >500 mcg/g

## 2020-10-29 DIAGNOSIS — E669 Obesity, unspecified: Secondary | ICD-10-CM | POA: Diagnosis not present

## 2020-10-29 DIAGNOSIS — D869 Sarcoidosis, unspecified: Secondary | ICD-10-CM | POA: Diagnosis not present

## 2020-10-29 DIAGNOSIS — E46 Unspecified protein-calorie malnutrition: Secondary | ICD-10-CM | POA: Diagnosis not present

## 2020-10-29 DIAGNOSIS — F3164 Bipolar disorder, current episode mixed, severe, with psychotic features: Secondary | ICD-10-CM | POA: Diagnosis not present

## 2020-10-29 DIAGNOSIS — Z6838 Body mass index (BMI) 38.0-38.9, adult: Secondary | ICD-10-CM | POA: Diagnosis not present

## 2020-10-29 DIAGNOSIS — S31109D Unspecified open wound of abdominal wall, unspecified quadrant without penetration into peritoneal cavity, subsequent encounter: Secondary | ICD-10-CM | POA: Diagnosis not present

## 2020-10-30 DIAGNOSIS — F250 Schizoaffective disorder, bipolar type: Secondary | ICD-10-CM | POA: Diagnosis not present

## 2020-10-30 DIAGNOSIS — D869 Sarcoidosis, unspecified: Secondary | ICD-10-CM | POA: Diagnosis not present

## 2020-10-30 DIAGNOSIS — G40909 Epilepsy, unspecified, not intractable, without status epilepticus: Secondary | ICD-10-CM | POA: Diagnosis not present

## 2020-10-30 DIAGNOSIS — K219 Gastro-esophageal reflux disease without esophagitis: Secondary | ICD-10-CM | POA: Diagnosis not present

## 2020-10-30 DIAGNOSIS — F909 Attention-deficit hyperactivity disorder, unspecified type: Secondary | ICD-10-CM | POA: Diagnosis not present

## 2020-10-30 DIAGNOSIS — I10 Essential (primary) hypertension: Secondary | ICD-10-CM | POA: Diagnosis not present

## 2020-10-30 DIAGNOSIS — E669 Obesity, unspecified: Secondary | ICD-10-CM | POA: Diagnosis not present

## 2020-10-30 DIAGNOSIS — Z9884 Bariatric surgery status: Secondary | ICD-10-CM | POA: Diagnosis not present

## 2020-10-30 DIAGNOSIS — Z8673 Personal history of transient ischemic attack (TIA), and cerebral infarction without residual deficits: Secondary | ICD-10-CM | POA: Diagnosis not present

## 2020-10-30 DIAGNOSIS — Z8616 Personal history of COVID-19: Secondary | ICD-10-CM | POA: Diagnosis not present

## 2020-10-30 DIAGNOSIS — G4733 Obstructive sleep apnea (adult) (pediatric): Secondary | ICD-10-CM | POA: Diagnosis not present

## 2020-10-30 DIAGNOSIS — Z79899 Other long term (current) drug therapy: Secondary | ICD-10-CM | POA: Diagnosis not present

## 2020-10-30 DIAGNOSIS — F209 Schizophrenia, unspecified: Secondary | ICD-10-CM | POA: Diagnosis not present

## 2020-10-30 DIAGNOSIS — Z87891 Personal history of nicotine dependence: Secondary | ICD-10-CM | POA: Diagnosis not present

## 2020-11-14 ENCOUNTER — Encounter: Payer: Self-pay | Admitting: Hematology and Oncology

## 2020-11-17 ENCOUNTER — Ambulatory Visit: Payer: Medicare Other

## 2020-11-19 DIAGNOSIS — S31109D Unspecified open wound of abdominal wall, unspecified quadrant without penetration into peritoneal cavity, subsequent encounter: Secondary | ICD-10-CM | POA: Diagnosis not present

## 2020-11-19 DIAGNOSIS — F3164 Bipolar disorder, current episode mixed, severe, with psychotic features: Secondary | ICD-10-CM | POA: Diagnosis not present

## 2020-11-21 DIAGNOSIS — F3164 Bipolar disorder, current episode mixed, severe, with psychotic features: Secondary | ICD-10-CM | POA: Diagnosis not present

## 2020-11-21 DIAGNOSIS — R251 Tremor, unspecified: Secondary | ICD-10-CM | POA: Diagnosis not present

## 2020-11-21 DIAGNOSIS — F4010 Social phobia, unspecified: Secondary | ICD-10-CM | POA: Diagnosis not present

## 2020-11-26 ENCOUNTER — Telehealth: Payer: Self-pay | Admitting: *Deleted

## 2020-11-26 NOTE — Chronic Care Management (AMB) (Signed)
  Chronic Care Management   Note  11/26/2020 Name: [name] MRN: [id number] DOB: [birth year]  [name] is a 45 y.o. year old female who is a primary care patient of Pickard, Cammie Mcgee, MD. I reached out to Lynnell Chad by phone today in response to a referral sent by [name]'s PCP.  [name] was given information about Chronic Care Management services today including:  CCM service includes personalized support from designated clinical staff supervised by her physician, including individualized plan of care and coordination with other care providers 24/7 contact phone numbers for assistance for urgent and routine care needs. Service will only be billed when office clinical staff spend 20 minutes or more in a month to coordinate care. Only one practitioner may furnish and bill the service in a calendar month. The patient may stop CCM services at any time (effective at the end of the month) by phone call to the office staff. The patient is responsible for co-pay (up to 20% after annual deductible is met) if co-pay is required by the individual health plan.   Patient agreed to services and verbal consent obtained.   Follow up plan: Telephone appointment with care management team member scheduled for:11/29/20  [place]: [phone]

## 2020-11-27 ENCOUNTER — Ambulatory Visit: Payer: Medicare Other | Admitting: Gastroenterology

## 2020-11-27 DIAGNOSIS — F250 Schizoaffective disorder, bipolar type: Secondary | ICD-10-CM | POA: Diagnosis not present

## 2020-11-27 DIAGNOSIS — I10 Essential (primary) hypertension: Secondary | ICD-10-CM | POA: Diagnosis not present

## 2020-11-29 ENCOUNTER — Ambulatory Visit (INDEPENDENT_AMBULATORY_CARE_PROVIDER_SITE_OTHER): Payer: Medicare Other | Admitting: *Deleted

## 2020-11-29 DIAGNOSIS — F3162 Bipolar disorder, current episode mixed, moderate: Secondary | ICD-10-CM

## 2020-11-29 DIAGNOSIS — K219 Gastro-esophageal reflux disease without esophagitis: Secondary | ICD-10-CM

## 2020-11-29 NOTE — Chronic Care Management (AMB) (Signed)
Chronic Care Management   CCM RN Visit Note  11/29/2020 Name: [name] MRN: [id number] DOB: Jul 08, 1975  Subjective: [name] is a 45 y.o. year old female who is a primary care patient of Pickard, Cammie Mcgee, MD. The care management team was consulted for assistance with disease management and care coordination needs.    Engaged with patient by telephone for initial visit in response to provider referral for case management and/or care coordination services.   Consent to Services:  The patient was given the following information about Chronic Care Management services today, agreed to services, and gave verbal consent: 1. CCM service includes personalized support from designated clinical staff supervised by the primary care provider, including individualized plan of care and coordination with other care providers 2. 24/7 contact phone numbers for assistance for urgent and routine care needs. 3. Service will only be billed when office clinical staff spend 20 minutes or more in a month to coordinate care. 4. Only one practitioner may furnish and bill the service in a calendar month. 5.The patient may stop CCM services at any time (effective at the end of the month) by phone call to the office staff. 6. The patient will be responsible for cost sharing (co-pay) of up to 20% of the service fee (after annual deductible is met). Patient agreed to services and consent obtained.  Patient agreed to services and verbal consent obtained.   Assessment: Review of patient past medical history, allergies, medications, health status, including review of consultants reports, laboratory and other test data, was performed as part of comprehensive evaluation and provision of chronic care management services.   SDOH (Social Determinants of Health) assessments and interventions performed:    CCM Care Plan  No Known Allergies  Outpatient Encounter Medications as of 11/29/2020  Medication Sig   MIRENA, 52  MG, 20 MCG/DAY IUD once.   QUEtiapine (SEROQUEL) 300 MG tablet Take 800 mg by mouth at bedtime.   valACYclovir (VALTREX) 500 MG tablet Take 1 tablet (500 mg total) by mouth daily as needed (fever blister).   albuterol (VENTOLIN HFA) 108 (90 Base) MCG/ACT inhaler Inhale 2 puffs into the lungs every 6 (six) hours as needed for wheezing or shortness of breath. (Patient not taking: Reported on 11/29/2020)   benztropine (COGENTIN) 2 MG tablet Take 2 mg by mouth 2 (two) times daily. (Patient not taking: Reported on 11/29/2020)   hydrOXYzine (ATARAX/VISTARIL) 50 MG tablet Take 50 mg by mouth every 3 (three) hours as needed. (Patient not taking: Reported on 11/29/2020)   loperamide (IMODIUM) 2 MG capsule Take by mouth. 2 tablets in the morning and 1 in the evenings (Patient not taking: Reported on 11/29/2020)   risperiDONE (RISPERDAL) 2 MG tablet Take 2 mg by mouth in the morning, at noon, and at bedtime. (Patient not taking: Reported on 11/29/2020)   No facility-administered encounter medications on file as of 11/29/2020.    Patient Active Problem List   Diagnosis Date Noted   Pain 03/08/2020   Overdose 03/08/2020   Iron deficiency anemia due to chronic blood loss 11/09/2019   Sarcoidosis 04/26/2019   Abnormal CT of the chest 03/23/2019   Dyspnea 03/23/2019   Hypokalemia 02/24/2019   Sepsis ([place]) 02/24/2019   Cerebellar infarct ([place]) 11/16/2018   CV[facility] ([facility]rebrovascular accident) [facility])    Degeneration of lumbar or lumbosacral intervertebral disc 12/13/2014   Anemia associated with nutritional deficiency 11/12/2014   Bipolar affective disorder ([place]) 09/28/2014   Anxiety state 09/28/2014   S/P  laparoscopic sleeve gastrectomy 01/2014 09/28/2014   Abdominal wall seroma - chronic    Seroma    Cholelithiases 04/25/2013   Cough 09/19/2011   Smoker 09/19/2011   GERD 03/04/2010   RECTAL BLEEDING 03/04/2010   DYSPHAGIA UNSPECIFIED 03/04/2010   DYSPHAGIA 03/04/2010   RUQ PAIN 03/04/2010    Conditions  to be addressed/monitored: Sarcoidosis, GERD, Bipolar disorder  Care Plan : Sarcoidosis/ GERD with comorbidity Bipolar disorder  Updates made by Kassie Mends, RN since 11/29/2020 12:00 AM     Problem: Health Promotion or Disease Self-Management (Sarcoidosis/ GERD)   Priority: Medium     Long-Range Goal: Self-Management Plan Developed for Sarcoidosis/ GERD with comorbidity Bipolar disorder   Start Date: 11/29/2020  Expected End Date: 05/30/2021  This Visit's Progress: On track  Priority: Medium  Note:   Current Barriers:  Ineffective Self Health Maintenance in a patient with Sarcoidosis Pulmonary Disease/ GERD.  Patient reports she lives alone and splits her time between her apartment and her mother's house, pt reports she is independent with all aspects of her care, continues to drive. Pt states she frequently sees Lendon Colonel psychiatrist at [facility] in [place], also receives ECT therapy in [place] for her bipolar disorder, pt denies depression today stating the medication she is taking has helped tremendously but causes her to have increased appetite.  Pt wants to see a psychologist/ counselor but wants this to be in [place], [facility] or with [facility] (not [facility]) and states she has not ask her doctor to complete a referral. Pt has wound vac with RN visiting from [place], pt has abdominal wound related to years ago colon rupture and hernia repair which led to other issues. Does not adhere to provider recommendations re: patient continues to vape nicotine and unable to quit at present Does not contact provider office for questions/concerns Clinical Goal(s):  Collaboration with Susy Frizzle, MD regarding development and update of comprehensive plan of care as evidenced by provider attestation and co-signature Inter-disciplinary care team collaboration (see longitudinal plan of care) patient will work with care management team to address care  coordination and chronic disease management needs related to Disease Management Educational Needs Care Coordination Medication Management and Education   Interventions:  Evaluation of current treatment plan related to Pulmonary Disease, self-management and patient's adherence to plan as established by provider. Collaboration with Susy Frizzle, MD regarding development and update of comprehensive plan of care as evidenced by provider attestation       and co-signature Inter-disciplinary care team collaboration (see longitudinal plan of care) Discussed plans with patient for ongoing care management follow up and provided patient with direct contact information for care management team Reviewed medications with patient and importance of taking as prescribed Offered services LCSW with assistance of management bipolar disorder and patient refuses at present Message sent to Dr. Dennard Schaumann requesting (per pt) a referral for psychologist in [place] or [place] and [place] is acceptable (and not [place]) Reviewed importance of scheduling flu vaccine Stressed importance of stopping vaping Reviewed healthy food choices and to avoid fried, spicy foods Reviewed having adequate protein in diet for good wound healing Encouraged patient to get outside daily and do some type of exercise Mailed educational information sarcoidosis and GERD Self Care Activities:  Attends all scheduled provider appointments Calls provider office for new concerns or questions Patient Goals: - discuss my treatment options with the doctor or nurse and make shared treatment decisions -  talk with your doctor about psychologist that is a good fit for you, RN sent note to Dr. Dennard Schaumann about a referral for psychologist - continue seeing your psychiatrist at [facility] - continue with ECT therapy in [place] - do one enjoyable thing every day, try to get outdoors at least 3 x per week - strengthen or fix relationships  with loved ones  - practice good handwashing and wear a mask if needed - schedule your flu vaccine - try to cut down or quit vaping nicotine - avoid fried and spicy foods - Look over information mailed- sarcoidosis and GERD Follow Up Plan: Telephone follow up appointment with care management team member scheduled for:    12/27/2020     Plan:Telephone follow up appointment with care management team member scheduled for: 12/27/2020  Jacqlyn Larsen [facility], BSN RN Case Manager [facility] [phone]

## 2020-11-29 NOTE — Patient Instructions (Signed)
Visit Information   PATIENT GOALS:   Goals Addressed             This Visit's Progress    Maintain My Quality of Life related to pulmonary sarcoidosis/ GERD       Timeframe:  Long-Range Goal Priority:  Medium Start Date:          11/29/2020                   Expected End Date   05/30/2021  Follow up Date- 12/27/2020   - discuss my treatment options with the doctor or nurse and make shared treatment decisions - talk with your doctor about psychologist that is a good fit for you, RN sent note to Dr. Dennard Schaumann about a referral for psychologist - continue seeing your psychiatrist at [facility] - continue with ECT therapy in [place] - do one enjoyable thing every day, try to get outdoors at least 3 x per week - strengthen or fix relationships with loved ones  - practice good handwashing and wear a mask if needed - schedule your flu vaccine - try to cut down or quit vaping nicotine - avoid fried and spicy foods - Look over information mailed- sarcoidosis and GERD    Why is this important?   Having a long-term illness can be scary.  It can also be stressful for you and your caregiver.  These steps may help.    Notes:         Consent to CCM Services: [name] was given information about Chronic Care Management services including:  CCM service includes personalized support from designated clinical staff supervised by her physician, including individualized plan of care and coordination with other care providers 24/7 contact phone numbers for assistance for urgent and routine care needs. Service will only be billed when office clinical staff spend 20 minutes or more in a month to coordinate care. Only one practitioner may furnish and bill the service in a calendar month. The patient may stop CCM services at any time (effective at the end of the month) by phone call to the office staff. The patient will be responsible for cost sharing (co-pay) of up to 20% of the service fee  (after annual deductible is met).  Patient agreed to services and verbal consent obtained.   The patient verbalized understanding of instructions, educational materials, and care plan provided today and agreed to receive a mailed copy of patient instructions, educational materials, and care plan.   Telephone follow up appointment with care management team member scheduled for:   01/06/2021  Sarcoidosis Sarcoidosis is a disease that can cause inflammation in many areas of the body. It most often affects the lungs (pulmonary sarcoidosis). Sarcoidosis can also affect the lymph nodes, liver, eyes, skin, heart, or any other body tissue. Normally, cells that are part of the body's disease-fighting system (immune system) attack harmful substances in the body, such as germs. This immune system response causes inflammation. After the harmful substance is destroyed, the inflammation goes away. When you have sarcoidosis, your immune system causes inflammation even when there are no harmful substances, and the inflammation does not go away. Sarcoidosis also causes cells from your immune system to form small lumps (granulomas) in the affected area of your body. What are the causes? The exact cause of sarcoidosis is not known.  If you have a family history of this disease (genetic predisposition), the immune system response that leads to inflammation may be triggered by something  in your environment, such as: Bacteria or viruses. Metals. Chemicals. Dust. Mold or mildew. What increases the risk? You may be more likely to develop this condition if you: Have a family history of the disease. Are African American. Are of Northern European descent. Are 48-61 years old. Are female. Work as a Airline pilot. Work in an environment where you are exposed to metals, chemicals, mold or mildew, or insecticides. What are the signs or symptoms? Some people with sarcoidosis have no symptoms. Others have very mild  symptoms. The symptoms usually depend on the organ that is affected. Sarcoidosis most often affects the lungs, which may lead to symptoms such as: Chest pain. Coughing. Wheezing. Shortness of breath. Other common symptoms include: Night sweats. Fever. Weight loss. Tiredness (fatigue). Swollen lymph nodes. Joint pain. How is this diagnosed? This condition may be diagnosed based on: Your symptoms and medical history. A physical exam. Imaging tests such as: Chest X-ray. CT scan. MRI. PET scan. Lung function tests. These tests evaluate your breathing and check for problems that may be related to sarcoidosis. A procedure to remove a tissue sample for testing (biopsy). You may have a biopsy of lung tissue if that is where you are having symptoms. You may have tests to check for any complications of the condition. These tests may include: Eye exams. MRI of the heart or brain. Echocardiogram. ECG (electrocardiogram). How is this treated? In some cases, sarcoidosis does not require a specific treatment because it causes no symptoms or only mild symptoms. If your symptoms bother you or are severe, you may be prescribed medicines to reduce inflammation or relieve symptoms. These medicines may include: Prednisone. This is a steroid that reduces inflammation related to sarcoidosis. Hydroxychloroquine. This may be used to treat sarcoidosis that affects the skin, eyes, or brain. Certain medicines that affect the immune system. These can help with sarcoidosis in the joints, eyes, skin, or lungs. Medicines that you breathe in (inhalers). Inhalers can help you breathe if sarcoidosis affects your lungs. Follow these instructions at home:  Do not use any products that contain nicotine or tobacco. These products include cigarettes, chewing tobacco, and vaping devices, such as e-cigarettes. If you need help quitting, ask your health care provider. Avoid secondhand smoke and irritating dust or  chemicals. Stay indoors on days when air quality is poor in your area. Return to your normal activities as told by your health care provider. Ask your health care provider what activities are safe for you. Take or use over-the-counter and prescription medicines only as told by your health care provider. Keep all follow-up visits. This is important. Where to find more information National Heart, Lung, and Blood Institute: https://wilson-eaton.com/ Contact a health care provider if: You have vision problems. You have a dry cough that does not go away. You have an irregular heartbeat. You have pain or aches in your joints, hands, or feet. You have an unexplained rash. Get help right away if: You have chest pain. You have trouble breathing. These symptoms may represent a serious problem that is an emergency. Do not wait to see if the symptoms will go away. Get medical help right away. Call your local emergency services (911 in the [place] Do not drive yourself to the hospital. Summary Sarcoidosis is a disease that can cause inflammation in many body areas of the body. It most often affects the lungs (pulmonary sarcoidosis). It can also affect the lymph nodes, liver, eyes, skin, heart, or any other body tissue. When you have sarcoidosis,  cells from your immune system form small lumps (granulomas) in the affected area of your body. Sarcoidosis sometimes does not require a specific treatment because it causes no symptoms or only mild symptoms. If your symptoms bother you or are severe, you may be prescribed medicines to reduce inflammation or relieve symptoms. This information is not intended to replace advice given to you by your health care provider. Make sure you discuss any questions you have with your health care provider. Document Revised: 12/12/2019 Document Reviewed: 12/12/2019 Elsevier Patient Education  2022 [place] for Gastroesophageal Reflux Disease, Adult When you have  gastroesophageal reflux disease (GERD), the foods you eat and your eating habits are very important. Choosing the right foods can help ease your discomfort. Think about working with a food expert (dietitian) to help you make good choices. What are tips for following this plan? Reading food labels Look for foods that are low in saturated fat. Foods that may help with your symptoms include: Foods that have less than 5% of daily value (DV) of fat. Foods that have 0 grams of trans fat. Cooking Do not fry your food. Cook your food by baking, steaming, grilling, or broiling. These are all methods that do not need a lot of fat for cooking. To add flavor, try to use herbs that are low in spice and acidity. Meal planning  Choose healthy foods that are low in fat, such as: Fruits and vegetables. Whole grains. Low-fat dairy products. Lean meats, fish, and poultry. Eat small meals often instead of eating 3 large meals each day. Eat your meals slowly in a place where you are relaxed. Avoid bending over or lying down until 2-3 hours after eating. Limit high-fat foods such as fatty meats or fried foods. Limit your intake of fatty foods, such as oils, butter, and shortening. Avoid the following as told by your doctor: Foods that cause symptoms. These may be different for different people. Keep a food diary to keep track of foods that cause symptoms. Alcohol. Drinking a lot of liquid with meals. Eating meals during the 2-3 hours before bed. Lifestyle Stay at a healthy weight. Ask your doctor what weight is healthy for you. If you need to lose weight, work with your doctor to do so safely. Exercise for at least 30 minutes on 5 or more days each week, or as told by your doctor. Wear loose-fitting clothes. Do not smoke or use any products that contain nicotine or tobacco. If you need help quitting, ask your doctor. Sleep with the head of your bed higher than your feet. Use a wedge under the mattress or  blocks under the bed frame to raise the head of the bed. Chew sugar-free gum after meals. What foods should eat? Eat a healthy, well-balanced diet of fruits, vegetables, whole grains, low-fat dairy products, lean meats, fish, and poultry. Each person is different. Foods that may cause symptoms in one person may not cause any symptoms in another person. Work with your doctor to find foods that are safe for you. The items listed above may not be a complete list of what you can eat and drink. Contact a food expert for more options. What foods should I avoid? Limiting some of these foods may help in managing the symptoms of GERD. Everyone is different. Talk with a food expert or your doctor to help you find the exact foods to avoid, if any. Fruits Any fruits prepared with added fat. Any fruits that cause symptoms. For  some people, this may include citrus fruits, such as oranges, grapefruit, pineapple, and lemons. Vegetables Deep-fried vegetables. [place] fries. Any vegetables prepared with added fat. Any vegetables that cause symptoms. For some people, this may include tomatoes and tomato products, chili peppers, onions and garlic, and horseradish. Grains Pastries or quick breads with added fat. Meats and other proteins High-fat meats, such as fatty beef or pork, hot dogs, ribs, ham, sausage, salami, and bacon. Fried meat or protein, including fried fish and fried chicken. Nuts and nut butters, in large amounts. Dairy Whole milk and chocolate milk. Sour cream. Cream. Ice cream. Cream cheese. Milkshakes. Fats and oils Butter. Margarine. Shortening. Ghee. Beverages Coffee and tea, with or without caffeine. Carbonated beverages. Sodas. Energy drinks. Fruit juice made with acidic fruits, such as orange or grapefruit. Tomato juice. Alcoholic drinks. Sweets and desserts Chocolate and cocoa. Donuts. Seasonings and condiments Pepper. Peppermint and spearmint. Added salt. Any condiments, herbs, or  seasonings that cause symptoms. For some people, this may include curry, hot sauce, or vinegar-based salad dressings. The items listed above may not be a complete list of what you should not eat and drink. Contact a food expert for more options. Questions to ask your doctor Diet and lifestyle changes are often the first steps that are taken to manage symptoms of GERD. If diet and lifestyle changes do not help, talk with your doctor about taking medicines. Where to find more information International Foundation for Gastrointestinal Disorders: aboutgerd.org Summary When you have GERD, food and lifestyle choices are very important in easing your symptoms. Eat small meals often instead of 3 large meals a day. Eat your meals slowly and in a place where you are relaxed. Avoid bending over or lying down until 2-3 hours after eating. Limit high-fat foods such as fatty meats or fried foods. This information is not intended to replace advice given to you by your health care provider. Make sure you discuss any questions you have with your health care provider. Document Revised: 08/21/2019 Document Reviewed: 08/21/2019 Elsevier Patient Education  2022 [facility], BSN RN Case Manager [facility] [phone]   CLINICAL CARE PLAN: Patient Care Plan: Sarcoidosis/ GERD     Problem Identified: Health Promotion or Disease Self-Management (Sarcoidosis/ GERD)   Priority: Medium     Long-Range Goal: Self-Management Plan Developed for Sarcoidosis/ GERD   Start Date: 11/29/2020  Expected End Date: 05/30/2021  This Visit's Progress: On track  Priority: Medium  Note:   Current Barriers:  Ineffective Self Health Maintenance in a patient with Sarcoidosis Pulmonary Disease/ GERD.  Patient reports she lives alone and splits her time between her apartment and her mother's house, pt reports she is independent with all aspects of her care, continues to drive. Pt states she  frequently sees Lendon Colonel psychiatrist at [facility] in [place], also receives ECT therapy in [place] for her bipolar disorder, pt denies depression today stating the medication she is taking has helped tremendously but causes her to have increased appetite.  Pt wants to see a psychologist/ counselor but wants this to be in [place], [facility] or with [facility] (not [facility]) and states she has not ask her doctor to complete a referral. Pt has wound vac with RN visiting from [place], pt has abdominal wound related to years ago colon rupture and hernia repair which led to other issues. Does not adhere to provider recommendations re: patient continues to vape nicotine and  unable to quit at present Does not contact provider office for questions/concerns Clinical Goal(s):  Collaboration with Susy Frizzle, MD regarding development and update of comprehensive plan of care as evidenced by provider attestation and co-signature Inter-disciplinary care team collaboration (see longitudinal plan of care) patient will work with care management team to address care coordination and chronic disease management needs related to Disease Management Educational Needs Care Coordination Medication Management and Education   Interventions:  Evaluation of current treatment plan related to Pulmonary Disease, self-management and patient's adherence to plan as established by provider. Collaboration with Susy Frizzle, MD regarding development and update of comprehensive plan of care as evidenced by provider attestation       and co-signature Inter-disciplinary care team collaboration (see longitudinal plan of care) Discussed plans with patient for ongoing care management follow up and provided patient with direct contact information for care management team Reviewed medications with patient and importance of taking as prescribed Offered services LCSW with assistance of management  bipolar disorder and patient refuses at present Message sent to Dr. Dennard Schaumann requesting (per pt) a referral for psychologist in [place] or [place] and [place] is acceptable (and not [place]) Reviewed importance of scheduling flu vaccine Stressed importance of stopping vaping Reviewed healthy food choices and to avoid fried, spicy foods Reviewed having adequate protein in diet for good wound healing Encouraged patient to get outside daily and do some type of exercise Mailed educational information sarcoidosis and GERD Self Care Activities:  Attends all scheduled provider appointments Calls provider office for new concerns or questions Patient Goals: - discuss my treatment options with the doctor or nurse and make shared treatment decisions - talk with your doctor about psychologist that is a good fit for you, RN sent note to Dr. Dennard Schaumann about a referral for psychologist - continue seeing your psychiatrist at [facility] - continue with ECT therapy in [place] - do one enjoyable thing every day, try to get outdoors at least 3 x per week - strengthen or fix relationships with loved ones  - practice good handwashing and wear a mask if needed - schedule your flu vaccine - try to cut down or quit vaping nicotine - avoid fried and spicy foods - Look over information mailed- sarcoidosis and GERD Follow Up Plan: Telephone follow up appointment with care management team member scheduled for:    12/27/2020

## 2020-12-02 ENCOUNTER — Ambulatory Visit: Payer: Self-pay | Admitting: *Deleted

## 2020-12-02 DIAGNOSIS — F3162 Bipolar disorder, current episode mixed, moderate: Secondary | ICD-10-CM

## 2020-12-02 DIAGNOSIS — K219 Gastro-esophageal reflux disease without esophagitis: Secondary | ICD-10-CM

## 2020-12-02 NOTE — Patient Instructions (Signed)
Visit Information  PATIENT GOALS:  Goals Addressed             This Visit's Progress    Maintain My Quality of Life related to pulmonary sarcoidosis/ GERD with comorbidity Bipolar disorder       Timeframe:  Long-Range Goal Priority:  Medium Start Date:          11/29/2020                   Expected End Date   05/30/2021  Follow up Date- 12/27/2020   - discuss my treatment options with the doctor or nurse and make shared treatment decisions - talk with your doctor about psychologist that is a good fit for you, RN sent note to Dr. Dennard Schaumann about a referral for psychologist - continue seeing your psychiatrist at [facility] - continue with ECT therapy in [place] - do one enjoyable thing every day, try to get outdoors at least 3 x per week - strengthen or fix relationships with loved ones  - practice good handwashing and wear a mask if needed - schedule your flu vaccine - try to cut down or quit vaping nicotine - avoid fried and spicy foods - Look over information mailed- sarcoidosis and GERD - Please talk to your psychiatrist at [facility]/ [place] and/or [facility] about referral to psychologist/ counselor so they can coordinate their care    Why is this important?   Having a long-term illness can be scary.  It can also be stressful for you and your caregiver.  These steps may help.    Notes:         The patient verbalized understanding of instructions, educational materials, and care plan provided today and declined offer to receive copy of patient instructions, educational materials, and care plan.   Telephone follow up appointment with care management team member scheduled for:   12/27/2020  Jacqlyn Larsen [facility], BSN RN Case Manager [facility] [phone]

## 2020-12-02 NOTE — Chronic Care Management (AMB) (Signed)
Chronic Care Management   CCM RN Visit Note  12/02/2020 Name: [name] MRN: [id number] DOB: 1975/04/15  Subjective: [name] is a 45 y.o. year old female who is a primary care patient of Pickard, Cammie Mcgee, MD. The care management team was consulted for assistance with disease management and care coordination needs.    Engaged with patient by telephone for follow up visit in response to provider referral for case management and/or care coordination services.   Consent to Services:  The patient was given information about Chronic Care Management services, agreed to services, and gave verbal consent prior to initiation of services.  Please see initial visit note for detailed documentation.   Patient agreed to services and verbal consent obtained.   Assessment: Review of patient past medical history, allergies, medications, health status, including review of consultants reports, laboratory and other test data, was performed as part of comprehensive evaluation and provision of chronic care management services.   SDOH (Social Determinants of Health) assessments and interventions performed:    CCM Care Plan  No Known Allergies  Outpatient Encounter Medications as of 12/02/2020  Medication Sig   albuterol (VENTOLIN HFA) 108 (90 Base) MCG/ACT inhaler Inhale 2 puffs into the lungs every 6 (six) hours as needed for wheezing or shortness of breath. (Patient not taking: Reported on 11/29/2020)   benztropine (COGENTIN) 2 MG tablet Take 2 mg by mouth 2 (two) times daily. (Patient not taking: Reported on 11/29/2020)   hydrOXYzine (ATARAX/VISTARIL) 50 MG tablet Take 50 mg by mouth every 3 (three) hours as needed. (Patient not taking: Reported on 11/29/2020)   loperamide (IMODIUM) 2 MG capsule Take by mouth. 2 tablets in the morning and 1 in the evenings (Patient not taking: Reported on 11/29/2020)   MIRENA, 52 MG, 20 MCG/DAY IUD once.   QUEtiapine (SEROQUEL) 300 MG tablet Take 800 mg by mouth  at bedtime.   risperiDONE (RISPERDAL) 2 MG tablet Take 2 mg by mouth in the morning, at noon, and at bedtime. (Patient not taking: Reported on 11/29/2020)   valACYclovir (VALTREX) 500 MG tablet Take 1 tablet (500 mg total) by mouth daily as needed (fever blister).   No facility-administered encounter medications on file as of 12/02/2020.    Patient Active Problem List   Diagnosis Date Noted   Pain 03/08/2020   Overdose 03/08/2020   Iron deficiency anemia due to chronic blood loss 11/09/2019   Sarcoidosis 04/26/2019   Abnormal CT of the chest 03/23/2019   Dyspnea 03/23/2019   Hypokalemia 02/24/2019   Sepsis ([place]) 02/24/2019   Cerebellar infarct ([place]) 11/16/2018   CVA (cerebrovascular accident) [facility])    Degeneration of lumbar or lumbosacral intervertebral disc 12/13/2014   Anemia associated with nutritional deficiency 11/12/2014   Bipolar affective disorder ([place]) 09/28/2014   Anxiety state 09/28/2014   S/P laparoscopic sleeve gastrectomy 01/2014 09/28/2014   Abdominal wall seroma - chronic    Seroma    Cholelithiases 04/25/2013   Cough 09/19/2011   Smoker 09/19/2011   GERD 03/04/2010   RECTAL BLEEDING 03/04/2010   DYSPHAGIA UNSPECIFIED 03/04/2010   DYSPHAGIA 03/04/2010   RUQ PAIN 03/04/2010    Conditions to be addressed/monitored:  GERD/ Bipolar disorder  Care Plan : Sarcoidosis/ GERD with comorbidity Bipolar disorder  Updates made by Kassie Mends, RN since 12/02/2020 12:00 AM     Problem: Health Promotion or Disease Self-Management (Sarcoidosis/ GERD)   Priority: Medium     Long-Range Goal: Self-Management Plan Developed for Sarcoidosis/ GERD with comorbidity  Bipolar disorder   Start Date: 11/29/2020  Expected End Date: 05/30/2021  This Visit's Progress: On track  Recent Progress: On track  Priority: Medium  Note:   Current Barriers:  Ineffective Self Health Maintenance in a patient with Sarcoidosis Pulmonary Disease/ GERD.  Patient reports she lives alone and  splits her time between her apartment and her mother's house, pt reports she is independent with all aspects of her care, continues to drive. Pt states she frequently sees Lendon Colonel psychiatrist at [facility] in [place], also receives ECT therapy in [place] for her bipolar disorder, pt denies depression today stating the medication she is taking has helped tremendously but causes her to have increased appetite.  Pt wants to see a psychologist/ counselor but wants this to be in [place], [facility] or with [facility] (not [facility]) and states she has not ask her doctor to complete a referral. Pt has wound vac with RN visiting from [place], pt has abdominal wound related to years ago colon rupture and hernia repair which led to other issues. Does not adhere to provider recommendations re: patient continues to vape nicotine and unable to quit at present Does not contact provider office for questions/concerns Clinical Goal(s):  Collaboration with Susy Frizzle, MD regarding development and update of comprehensive plan of care as evidenced by provider attestation and co-signature Inter-disciplinary care team collaboration (see longitudinal plan of care) patient will work with care management team to address care coordination and chronic disease management needs related to Disease Management Educational Needs Care Coordination Medication Management and Education   Interventions:  Evaluation of current treatment plan related to Pulmonary Disease, self-management and patient's adherence to plan as established by provider. Collaboration with Susy Frizzle, MD regarding development and update of comprehensive plan of care as evidenced by provider attestation       and co-signature Inter-disciplinary care team collaboration (see longitudinal plan of care) Discussed plans with patient for ongoing care management follow up and provided patient with direct contact information  for care management team Reviewed medications with patient and importance of taking as prescribed Offered services LCSW with assistance of management bipolar disorder and patient refuses at present Message sent to Dr. Dennard Schaumann requesting (per pt) a referral for psychologist in [place] or [place] and [place] is acceptable (and not [place]) Reviewed importance of scheduling flu vaccine Stressed importance of stopping vaping Reviewed healthy food choices and to avoid fried, spicy foods Reviewed having adequate protein in diet for good wound healing Encouraged patient to get outside daily and do some type of exercise Mailed educational information sarcoidosis and GERD Spoke with patient and informed her -per primary care provider, pt should discuss referral to psychologist with psychiatrist she is currently seeing through [facility] and/ or her doctor at [facility] providing E[facility] therapy, pt verbalizes understanding [place] Activities:  Attends all scheduled provider appointments Calls provider office for new concerns or questions Patient Goals: - discuss my treatment options with the doctor or nurse and make shared treatment decisions - talk with your doctor about psychologist that is a good fit for you, RN sent note to Dr. Dennard Schaumann about a referral for psychologist - continue seeing your psychiatrist at [facility] - continue with ECT therapy in [place] - do one enjoyable thing every day, try to get outdoors at least 3 x per week - strengthen or fix relationships with loved ones  - practice good handwashing and wear  a mask if needed - schedule your flu vaccine - try to cut down or quit vaping nicotine - avoid fried and spicy foods - Look over information mailed- sarcoidosis and GERD - Please talk to your psychiatrist at [facility]/ [place] and/or [facility] about referral to psychologist/ counselor so they can coordinate their care Follow Up Plan: Telephone follow up  appointment with care management team member scheduled for:    12/27/2020     Plan:Telephone follow up appointment with care management team member scheduled for:  12/27/2020   Jacqlyn Larsen [facility], BSN RN Case Manager [facility] [phone]

## 2020-12-05 DIAGNOSIS — M47896 Other spondylosis, lumbar region: Secondary | ICD-10-CM | POA: Diagnosis not present

## 2020-12-05 DIAGNOSIS — M47816 Spondylosis without myelopathy or radiculopathy, lumbar region: Secondary | ICD-10-CM | POA: Diagnosis not present

## 2020-12-16 DIAGNOSIS — S31109D Unspecified open wound of abdominal wall, unspecified quadrant without penetration into peritoneal cavity, subsequent encounter: Secondary | ICD-10-CM | POA: Diagnosis not present

## 2020-12-16 DIAGNOSIS — D869 Sarcoidosis, unspecified: Secondary | ICD-10-CM | POA: Diagnosis not present

## 2020-12-17 ENCOUNTER — Other Ambulatory Visit: Payer: Self-pay

## 2020-12-17 ENCOUNTER — Ambulatory Visit (HOSPITAL_COMMUNITY)
Admission: RE | Admit: 2020-12-17 | Discharge: 2020-12-17 | Disposition: A | Discharge status: Home / Self Care | Payer: Medicare Other | Source: Ambulatory Visit | Attending: Emergency Medicine | Admitting: Emergency Medicine

## 2020-12-17 DIAGNOSIS — Z8659 Personal history of other mental and behavioral disorders: Secondary | ICD-10-CM | POA: Diagnosis not present

## 2020-12-17 DIAGNOSIS — R590 Localized enlarged lymph nodes: Secondary | ICD-10-CM | POA: Diagnosis not present

## 2020-12-17 DIAGNOSIS — F3164 Bipolar disorder, current episode mixed, severe, with psychotic features: Secondary | ICD-10-CM | POA: Diagnosis not present

## 2020-12-17 DIAGNOSIS — R918 Other nonspecific abnormal finding of lung field: Secondary | ICD-10-CM | POA: Diagnosis not present

## 2020-12-17 DIAGNOSIS — D869 Sarcoidosis, unspecified: Secondary | ICD-10-CM | POA: Insufficient documentation

## 2020-12-17 DIAGNOSIS — F4010 Social phobia, unspecified: Secondary | ICD-10-CM | POA: Diagnosis not present

## 2020-12-17 DIAGNOSIS — R911 Solitary pulmonary nodule: Secondary | ICD-10-CM | POA: Diagnosis not present

## 2020-12-17 DIAGNOSIS — Z79899 Other long term (current) drug therapy: Secondary | ICD-10-CM | POA: Diagnosis not present

## 2020-12-17 DIAGNOSIS — J90 Pleural effusion, not elsewhere classified: Secondary | ICD-10-CM | POA: Diagnosis not present

## 2020-12-19 ENCOUNTER — Ambulatory Visit (INDEPENDENT_AMBULATORY_CARE_PROVIDER_SITE_OTHER): Payer: Medicare Other

## 2020-12-19 ENCOUNTER — Other Ambulatory Visit: Payer: Self-pay

## 2020-12-19 ENCOUNTER — Ambulatory Visit: Payer: Medicare Other

## 2020-12-19 VITALS — Ht 65.0 in | Wt 254.0 lb

## 2020-12-19 DIAGNOSIS — Z0100 Encounter for examination of eyes and vision without abnormal findings: Secondary | ICD-10-CM

## 2020-12-19 DIAGNOSIS — D869 Sarcoidosis, unspecified: Secondary | ICD-10-CM | POA: Diagnosis not present

## 2020-12-19 DIAGNOSIS — I6930 Unspecified sequelae of cerebral infarction: Secondary | ICD-10-CM

## 2020-12-19 DIAGNOSIS — Z0000 Encounter for general adult medical examination without abnormal findings: Secondary | ICD-10-CM

## 2020-12-19 NOTE — Progress Notes (Addendum)
Subjective:   [name] is a 45 y.o. female who presents for an Initial Medicare Annual Wellness Visit. Virtual Visit via Telephone Note  I connected with  [name] on 12/19/20 at  3:30 PM EDT by telephone and verified that I am speaking with the correct person using two identifiers.  Location: Patient: Home Provider: [facility] Persons participating in the virtual visit: patient/Nurse Health Advisor   I discussed the limitations, risks, security and privacy concerns of performing an evaluation and management service by telephone and the availability of in person appointments. The patient expressed understanding and agreed to proceed.  Interactive audio and video telecommunications were attempted between this nurse and patient, however failed, due to patient having technical difficulties OR patient did not have access to video capability.  We continued and completed visit with audio only.  Some vital signs may be absent or patient reported.   Chriss Driver, LPN  Review of Systems     Cardiac Risk Factors include: obesity (BMI >30kg/m2);sedentary lifestyle     Objective:    Today's Vitals   12/19/20 1510 12/19/20 1511  Weight: 254 lb (115.2 kg)   Height: 5\' 5"  (1.651 m)   PainSc:  3    Body mass index is 42.27 kg/m.  Advanced Directives 12/19/2020 11/29/2020 03/04/2020 02/29/2020 11/16/2019 11/09/2019 06/29/2019  Does Patient Have a Medical Advance Directive? No No No No No No No  Would patient like information on creating a medical advance directive? No - Patient declined No - Patient declined No - Patient declined - No - Guardian declined No - Patient declined No - Patient declined    Current Medications (verified) Outpatient Encounter Medications as of 12/19/2020  Medication Sig   albuterol (VENTOLIN HFA) 108 (90 Base) MCG/ACT inhaler Inhale 2 puffs into the lungs every 6 (six) hours as needed for wheezing or shortness of breath.   benztropine (COGENTIN) 2 MG  tablet Take 2 mg by mouth 2 (two) times daily.   hydrOXYzine (ATARAX/VISTARIL) 50 MG tablet Take 50 mg by mouth every 3 (three) hours as needed.   loperamide (IMODIUM) 2 MG capsule Take by mouth. 2 tablets in the morning and 1 in the evenings   MIRENA, 52 MG, 20 MCG/DAY IUD once.   QUEtiapine (SEROQUEL) 300 MG tablet Take 800 mg by mouth at bedtime.   risperiDONE (RISPERDAL) 2 MG tablet Take 2 mg by mouth in the morning, at noon, and at bedtime.   Suvorexant 20 MG TABS Take 20 mg by mouth at bedtime.   valACYclovir (VALTREX) 500 MG tablet Take 1 tablet (500 mg total) by mouth daily as needed (fever blister).   No facility-administered encounter medications on file as of 12/19/2020.    Allergies (verified) Patient has no known allergies.   History: Past Medical History:  Diagnosis Date   ADHD    Anal fistula    Anxiety    Arthritis    pt denies 5/3 visit    Bipolar affective ([place])    Blood transfusion without reported diagnosis    Colostomy in place [facility])    CVA (cerebrovascular accident) ([place])    left cerebellar infarct found accidentally on CT (2020)    Depression    Diverticulitis 2008   perforated/ requiring resection   Fatty liver    Gallstones    GERD (gastroesophageal reflux disease)    occ.   Headache    after a fall   Herpes simplex    History of colon  polyps    History of hiatal hernia    small   History of kidney stones    cystoscopy  basket removal   Hypertension    Iron deficiency anemia    Obese    OCD (obsessive compulsive disorder)    Panic attacks    PONV (postoperative nausea and vomiting) 10/04/2020   Pulmonary nodules    Sarcoidosis    Schizophrenia ([place])    Shortness of breath dyspnea    pt denies on 5/3 visit    Sleep apnea    mild, does not use c-pap machine   Substance abuse ([place])    12 years ago-crack cocaine   Past Surgical History:  Procedure Laterality Date   Abdominal wall hernia  07/2007   open repair with lysis of adhesions    APPLICATION OF WOUND VAC N/A 06/29/2019   Procedure: PLACEMENT OF WOUND VAC;  Surgeon: Greer Pickerel, MD;  Location: [facility];  Service: General;  Laterality: N/A;   BRONCHIAL BRUSHINGS  04/11/2019   Procedure: BRONCHIAL BRUSHINGS;  Surgeon: Collene Gobble, MD;  Location: [facility]OSCOPY;  Service: Pulmonary;;   BRONCHIAL WASHINGS  04/11/2019   Procedure: BRONCHIAL WASHINGS;  Surgeon: Collene Gobble, MD;  Location: [facility] ENDOSCOPY;  Service: Pulmonary;;   COLONOSCOPY  03/13/2010   YIF:OYDXAJOI hemorrhoids likely cause of hematochezia, otherwise normal   COLONOSCOPY  05/24/2019   COLONOSCOPY WITH ESOPHAGOGASTRODUODENOSCOPY (EGD)  04/2019   Colostomy reversal  11/2006   DEBRIDEMENT OF ABDOMINAL WALL ABSCESS N/A 06/29/2019   Procedure: EXCISIONAL DEBRIDEMENT OF ABDOMINAL WALL/SUBCU SEROMA;  Surgeon: Greer Pickerel, MD;  Location: [facility];  Service: General;  Laterality: N/A;   ESOPHAGOGASTRODUODENOSCOPY  03/13/2010   NOM:[id number] esophagus, status post passage of a [place] dilator/Small hiatal hernia/ Antral erosions, status post biopsy   EXAMINATION UNDER ANESTHESIA N/A 05/11/2012   Procedure: EXAM UNDER ANESTHESIA;  Surgeon: Donato Heinz, MD;  Location: [facility];  Service: General;  Laterality: N/A;   Exploratory laparotomy with resection  2008   colonoscopy   FINE NEEDLE ASPIRATION  04/11/2019   Procedure: FINE NEEDLE ASPIRATION (FNA) LINEAR;  Surgeon: Collene Gobble, MD;  Location: [place] [facility]OSCOPY;  Service: Pulmonary;;   FINGER CLOSED REDUCTION Right 08/05/2012   Procedure: CLOSED REDUCTION RIGHT THUMB (FINGER);  Surgeon: Linna Hoff, MD;  Location: [place];  Service: Orthopedics;  Laterality: Right;   HERNIA REPAIR     incisional hernia surgery    Hx of abd wall seroma  08/2007   Drained via [place] in [place], [place]   Hx of abd wall seroma  10/2007   Drained by Dr. Geroge Baseman in office   [place] N/A 06/29/2019   Procedure: INCISION AND DRAINAGE;  Surgeon: Greer Pickerel, MD;  Location:  [facility];  Service: General;  Laterality: N/A;   IR RADIOLOGIST EVAL & MGMT  03/15/2019   IR RADIOLOGIST EVAL & MGMT  04/06/2019   Kidney stones     LAPAROSCOPIC GASTRIC SLEEVE RESECTION N/A 02/13/2014   Procedure: LAPAROSCOPIC GASTRIC SLEEVE RESECTION LYSIS OF ADHESIONS, UPPER ENDOSCOPY;  Surgeon: [name], MD;  Location[facility];  Service: General;  Laterality: N/A;   LAPAROSCOPY N/A 09/28/2014   Procedure: LAPAROSCOPY DIAGNOSTIC, INCISION AND DRAINAGE WITH LAPAROSCOPIC EXPLORATION OF ABDOMINAL WALL SEROMA with ultrasound;  Surgeon: Greer Pickerel, MD;  Location[facility];  Service: General;  Laterality: N/A;   LUNG BIOPSY  04/11/2019   Procedure: LUNG BIOPSY;  Surgeon: Collene Gobble, MD;  Location: [place];  Service: Pulmonary;;  distal trachea   PLACEMENT OF SETON N/A 05/11/2012   Procedure: PLACEMENT OF SETON;  Surgeon: Donato Heinz, MD;  Location: [facility];  Service: General;  Laterality: N/A;   root canal 7-16     TONSILLECTOMY     TREATMENT FISTULA ANAL     x 2   UPPER GASTROINTESTINAL ENDOSCOPY  05/24/2019   VIDEO BRONCHOSCOPY WITH ENDOBRONCHIAL ULTRASOUND N/A 04/11/2019   Procedure: VIDEO BRONCHOSCOPY WITH ENDOBRONCHIAL ULTRASOUND;  Surgeon: Collene Gobble, MD;  Location: [place] ENDOSCOPY;  Service: Pulmonary;  Laterality: N/A;   Family History  Problem Relation Age of Onset   Asthma Maternal Grandmother    Colon polyps Maternal Grandmother    Colon cancer Maternal Grandmother    Esophageal cancer Neg Hx    Rectal cancer Neg Hx    Stomach cancer Neg Hx    Pancreatic cancer Neg Hx    Liver disease Neg Hx    Social History   Socioeconomic History   Marital status: Single    Spouse name: Not on file   Number of children: 2   Years of education: 12    Highest education level: Not on file  Occupational History   Occupation: Insurance claims handler: UNEMPLOYED  Tobacco Use   Smoking status: Former    Packs/day: 0.50    Years: 14.00    Pack years: 7.00    Types:  E-cigarettes, Cigarettes    Quit date: 12/20/2013    Years since quitting: 7.0   Smokeless tobacco: Never   Tobacco comments:    Vapor cigarettes.  Vaping Use   Vaping Use: Every day   Last attempt to quit: 02/24/2019   Substances: Nicotine, Flavoring   Devices: started 2015  Substance and Sexual Activity   Alcohol use: No    Alcohol/week: 0.0 standard drinks   Drug use: Never   Sexual activity: Not Currently    Birth control/protection: Abstinence  Other Topics Concern   Not on file  Social History Narrative   2 children   Social Determinants of Health   Financial Resource Strain: Low Risk    Difficulty of Paying Living Expenses: Not hard at all  Food Insecurity: No Food Insecurity   Worried About Charity fundraiser in the Last Year: Never true   Ran Out of Food in the Last Year: Never true  Transportation Needs: No Transportation Needs   Lack of Transportation (Medical): No   Lack of Transportation (Non-Medical): No  Physical Activity: Inactive   Days of Exercise per Week: 0 days   Minutes of Exercise per Session: 0 min  Stress: No Stress Concern Present   Feeling of Stress : Not at all  Social Connections: Moderately Integrated   Frequency of Communication with Friends and Family: More than three times a week   Frequency of Social Gatherings with Friends and Family: More than three times a week   Attends Religious Services: 1 to 4 times per year   Active Member of [employer] or Organizations: Yes   Attends Archivist Meetings: 1 to 4 times per year   Marital Status: Never married    Tobacco Counseling Counseling given: Not Answered Tobacco comments: Vapor cigarettes.   Clinical Intake:  Pre-visit preparation completed: Yes  Pain : 0-10 Pain Score: 3  Pain Type: Chronic pain Pain Location: Back Pain Onset: More than a month ago Pain Frequency: Intermittent     BMI - recorded: 42.27 Nutritional Status: BMI >  30  Obese Nutritional Risks:  None Diabetes: No  How often do you need to have someone help you when you read instructions, pamphlets, or other written materials from your doctor or pharmacy?: 1 - Never  Diabetic?NO  Interpreter Needed?: No  Information entered by :: MJ Adonai Selsor, LPN   Activities of Daily Living In your present state of health, do you have any difficulty performing the following activities: 12/19/2020  Hearing? N  Vision? N  Difficulty concentrating or making decisions? N  Walking or climbing stairs? N  Dressing or bathing? N  Doing errands, shopping? N  Preparing Food and eating ? N  Using the Toilet? N  In the past six months, have you accidently leaked urine? N  Do you have problems with loss of bowel control? Y  Comment IBS at times.  Managing your Medications? N  Managing your Finances? N  Housekeeping or managing your Housekeeping? N  Some recent data might be hidden    Patient Care Team: Susy Frizzle, MD as PCP - General (Family Medicine) Greer Pickerel, MD as Consulting Physician (General Surgery) Kassie Mends, RN as [place] any recent [place] you may have received from other than [facility] providers in the past year (date may be approximate).     Assessment:   This is a routine wellness examination for [facility].  Hearing/Vision screen Hearing Screening - Comments:: No hearing issues.   Vision Screening - Comments:: No glasses. Would like eye referral.  Dietary issues and exercise activities discussed: Current Exercise Habits: The patient does not participate in regular exercise at present, Exercise limited by: cardiac condition(s);orthopedic condition(s)   Goals Addressed             This Visit's Progress    DIET - REDUCE CALORIE INTAKE       Pt states she would like to lose 40#     Maintain My Quality of Life related to pulmonary sarcoidosis/ GERD with comorbidity Bipolar disorder   On track    Timeframe:   Long-Range Goal Priority:  Medium Start Date:          11/29/2020                   Expected End Date   05/30/2021  Follow up Date- 12/27/2020   - discuss my treatment options with the doctor or nurse and make shared treatment decisions - talk with your doctor about psychologist that is a good fit for you, RN sent note to Dr. Dennard Schaumann about a referral for psychologist - continue seeing your psychiatrist at [facility] - continue with ECT therapy in [place] - do one enjoyable thing every day, try to get outdoors at least 3 x per week - strengthen or fix relationships with loved ones  - practice good handwashing and wear a mask if needed - schedule your flu vaccine - try to cut down or quit vaping nicotine - avoid fried and spicy foods - Look over information mailed- sarcoidosis and GERD - Please talk to your psychiatrist at [facility] [place] and/or [facility] about referral to psychologist/ counselor so they can coordinate their care    Why is this important?   Having a long-term illness can be scary.  It can also be stressful for you and your caregiver.  These steps may help.    Notes:        Depression Screen PHQ 2/9 Scores 12/19/2020 11/29/2020 03/20/2019 03/22/2018 10/18/2017 01/06/2017  12/10/2015  PHQ - 2 Score 0 1 0 0 2 2 0  PHQ- 9 Score - - - 6 6 7  -  Some encounter information is confidential and restricted. Go to Review Flowsheets activity to see all data.    Fall Risk Fall Risk  12/19/2020 11/29/2020 03/19/2020 03/20/2019 10/18/2017  Falls in the past year? 0 0 1 0 No  Number falls in past yr: 0 - 1 0 -  Injury with Fall? 0 - 1 - -  Risk Factor Category  - - - - -  Risk for fall due to : Impaired balance/gait;Impaired mobility - - - -  Risk for fall due to: Comment - - - - -  Follow up Falls prevention discussed - Falls evaluation completed - -    FALL RISK PREVENTION PERTAINING TO THE HOME:  Any stairs in or around the home? No  If so, are there any without  handrails? Yes  Home free of loose throw rugs in walkways, pet beds, electrical cords, etc? Yes  Adequate lighting in your home to reduce risk of falls? Yes   ASSISTIVE DEVICES UTILIZED TO PREVENT FALLS:  Life alert? Yes  Use of a cane, walker or w/c? No  Grab bars in the bathroom? Yes  Shower chair or bench in shower? Yes  Elevated toilet seat or a handicapped toilet? Yes   TIMED UP AND GO:  Was the test performed? No . Phone visit.    Cognitive Function:     6CIT Screen 12/19/2020  What Year? 0 points  What month? 0 points  What time? 0 points  Count back from 20 0 points  Months in reverse 0 points  Repeat phrase 0 points  Total Score 0    Immunizations Immunization History  Administered Date(s) Administered   Hep A / Hep B 07/05/2008, 08/16/2008, 05/10/2009   Influenza Whole 11/06/2011   Influenza,inj,Quad PF,6+ Mos 12/13/2014, 12/19/2015, 01/06/2017, 03/22/2018, 12/15/2018, 11/23/2019   Influenza-Unspecified 11/06/2011, 10/29/2012   Moderna Sars-Covid-2 Vaccination 10/05/2019, 11/02/2019   Tdap 08/18/2010, 10/27/2011    TDAP status: Up to date  Flu Vaccine status: Due, Education has been provided regarding the importance of this vaccine. Advised may receive this vaccine at local pharmacy or Health Dept. Aware to provide a copy of the vaccination record if obtained from local pharmacy or Health Dept. Verbalized acceptance and understanding.  Pneumococcal vaccine status: Due, Education has been provided regarding the importance of this vaccine. Advised may receive this vaccine at local pharmacy or Health Dept. Aware to provide a copy of the vaccination record if obtained from local pharmacy or Health Dept. Verbalized acceptance and understanding.  Covid-19 vaccine status: Information provided on how to obtain vaccines.   Qualifies for Shingles Vaccine? No   Zostavax completed  NA   Shingrix Completed?: No.    Education has been provided regarding the importance  of this vaccine. Patient has been advised to call insurance company to determine out of pocket expense if they have not yet received this vaccine. Advised may also receive vaccine at local pharmacy or Health Dept. Verbalized acceptance and understanding.  Screening Tests Health Maintenance  Topic Date Due   Pneumococcal Vaccine 39-59 Years old (1 - PCV) Never done   Hepatitis C Screening  Never done   PAP SMEAR-Modifier  04/05/2014   COVID-19 Vaccine (3 - Moderna risk series) 11/30/2019   INFLUENZA VACCINE  09/23/2020   TETANUS/TDAP  10/26/2021   COLONOSCOPY (Pts 45-16yrs Insurance coverage will need to be  confirmed)  05/24/2022   HIV Screening  Completed   HPV VACCINES  Aged Out    Health Maintenance  Health Maintenance Due  Topic Date Due   Pneumococcal Vaccine 33-27 Years old (1 - PCV) Never done   Hepatitis C Screening  Never done   PAP SMEAR-Modifier  04/05/2014   COVID-19 Vaccine (3 - Moderna risk series) 11/30/2019   INFLUENZA VACCINE  09/23/2020    Colorectal cancer screening: Type of screening: Colonoscopy. Completed 05/14/2019. Repeat every 3 years  Mammogram status: Ordered Pt declined until age 9. Pt provided with contact info and advised to call to schedule appt.   Bone Density status: Ordered Not required until age 87. Pt provided with contact info and advised to call to schedule appt.  Lung Cancer Screening: (Low Dose CT Chest recommended if Age 104-80 years, 30 pack-year currently smoking OR have quit w/in 15years.) does not qualify.    Additional Screening:  Hepatitis C Screening: does not qualify.  Vision Screening: Recommended annual ophthalmology exams for early detection of glaucoma and other disorders of the eye. Is the patient up to date with their annual eye exam?  No  Who is the provider or what is the name of the office in which the patient attends annual eye exams? N/A If pt is not established with a provider, would they like to be referred to a  provider to establish care? Yes .   Dental Screening: Recommended annual dental exams for proper oral hygiene  Community Resource Referral / Chronic Care Management: CRR required this visit?  No   CCM required this visit?  No      Plan:     I have personally reviewed and noted the following in the patient's chart:   Medical and social history Use of alcohol, tobacco or illicit drugs  Current medications and supplements including opioid prescriptions. Patient is not currently taking opioid prescriptions. Functional ability and status Nutritional status Physical activity Advanced directives List of other physicians Hospitalizations, surgeries, and ER visits in previous 12 months Vitals Screenings to include cognitive, depression, and falls Referrals and appointments  In addition, I have reviewed and discussed with patient certain preventive protocols, quality metrics, and best practice recommendations. A written personalized care plan for preventive services as well as general preventive health recommendations were provided to patient.     Chriss Driver, LPN   [id number]   Nurse Notes: Pt states she is scheduled for an ablation on there back tomorrow morning to treat her back pain. Pt would like referral for eye MD. Pt would like to wait until age 85 on mammogram.

## 2020-12-19 NOTE — Patient Instructions (Signed)
Ms. Conkey , Thank you for taking time to come for your Medicare Wellness Visit. I appreciate your ongoing commitment to your health goals. Please review the following plan we discussed and let me know if I can assist you in the future.   Screening recommendations/referrals: Colonoscopy: Done 05/24/2019, repeat every 3 years.  Mammogram: Due at age 45. Bone Density: Due at age 88.  Recommended yearly ophthalmology/optometry visit for glaucoma screening and checkup Recommended yearly dental visit for hygiene and checkup  Vaccinations: Influenza vaccine: Due. Repeat annually  Pneumococcal vaccine: Due at age 27. Tdap vaccine: Due 10/26/2021. Repeat in 10 years  Shingles vaccine: Not due until age 61.  Covid-19: Done 10/05/19 and 11/02/2019  Advanced directives: Advance directive discussed with you today. Even though you declined this today, please call our office should you change your mind, and we can give you the proper paperwork for you to fill out.   Conditions/risks identified: Aim for 30 minutes of exercise or walking each day, drink 6-8 glasses of water and eat lots of fruits and vegetables.   Next appointment: Follow up in one year for your annual wellness visit. 2023.  Preventive Care 40-64 Years, Female Preventive care refers to lifestyle choices and visits with your health care provider that can promote health and wellness. What does preventive care include? A yearly physical exam. This is also called an annual well check. Dental exams once or twice a year. Routine eye exams. Ask your health care provider how often you should have your eyes checked. Personal lifestyle choices, including: Daily care of your teeth and gums. Regular physical activity. Eating a healthy diet. Avoiding tobacco and drug use. Limiting alcohol use. Practicing safe sex. Taking low-dose aspirin daily starting at age 28. Taking vitamin and mineral supplements as recommended by your health care  provider. What happens during an annual well check? The services and screenings done by your health care provider during your annual well check will depend on your age, overall health, lifestyle risk factors, and family history of disease. Counseling  Your health care provider may ask you questions about your: Alcohol use. Tobacco use. Drug use. Emotional well-being. Home and relationship well-being. Sexual activity. Eating habits. Work and work Statistician. Method of birth control. Menstrual cycle. Pregnancy history. Screening  You may have the following tests or measurements: Height, weight, and BMI. Blood pressure. Lipid and cholesterol levels. These may be checked every 5 years, or more frequently if you are over 23 years old. Skin check. Lung cancer screening. You may have this screening every year starting at age 74 if you have a 30-pack-year history of smoking and currently smoke or have quit within the past 15 years. Fecal occult blood test (FOBT) of the stool. You may have this test every year starting at age 6. Flexible sigmoidoscopy or colonoscopy. You may have a sigmoidoscopy every 5 years or a colonoscopy every 10 years starting at age 66. Hepatitis C blood test. Hepatitis B blood test. Sexually transmitted disease (STD) testing. Diabetes screening. This is done by checking your blood sugar (glucose) after you have not eaten for a while (fasting). You may have this done every 1-3 years. Mammogram. This may be done every 1-2 years. Talk to your health care provider about when you should start having regular mammograms. This may depend on whether you have a family history of breast cancer. BRCA-related cancer screening. This may be done if you have a family history of breast, ovarian, tubal, or peritoneal cancers. Pelvic  exam and Pap test. This may be done every 3 years starting at age 26. Starting at age 23, this may be done every 5 years if you have a Pap test in  combination with an HPV test. Bone density scan. This is done to screen for osteoporosis. You may have this scan if you are at high risk for osteoporosis. Discuss your test results, treatment options, and if necessary, the need for more tests with your health care provider. Vaccines  Your health care provider may recommend certain vaccines, such as: Influenza vaccine. This is recommended every year. Tetanus, diphtheria, and acellular pertussis (Tdap, Td) vaccine. You may need a Td booster every 10 years. Zoster vaccine. You may need this after age 30. Pneumococcal 13-valent conjugate (PCV13) vaccine. You may need this if you have certain conditions and were not previously vaccinated. Pneumococcal polysaccharide (PPSV23) vaccine. You may need one or two doses if you smoke cigarettes or if you have certain conditions. Talk to your health care provider about which screenings and vaccines you need and how often you need them. This information is not intended to replace advice given to you by your health care provider. Make sure you discuss any questions you have with your health care provider. Document Released: 03/08/2015 Document Revised: 10/30/2015 Document Reviewed: 12/11/2014 Elsevier Interactive Patient Education  2017 [place] Prevention in the Home Falls can cause injuries. They can happen to people of all ages. There are many things you can do to make your home safe and to help prevent falls. What can I do on the outside of my home? Regularly fix the edges of walkways and driveways and fix any cracks. Remove anything that might make you trip as you walk through a door, such as a raised step or threshold. Trim any bushes or trees on the path to your home. Use bright outdoor lighting. Clear any walking paths of anything that might make someone trip, such as rocks or tools. Regularly check to see if handrails are loose or broken. Make sure that both sides of any steps have  handrails. Any raised decks and porches should have guardrails on the edges. Have any leaves, snow, or ice cleared regularly. Use sand or salt on walking paths during winter. Clean up any spills in your garage right away. This includes oil or grease spills. What can I do in the bathroom? Use night lights. Install grab bars by the toilet and in the tub and shower. Do not use towel bars as grab bars. Use non-skid mats or decals in the tub or shower. If you need to sit down in the shower, use a plastic, non-slip stool. Keep the floor dry. Clean up any water that spills on the floor as soon as it happens. Remove soap buildup in the tub or shower regularly. Attach bath mats securely with double-sided non-slip rug tape. Do not have throw rugs and other things on the floor that can make you trip. What can I do in the bedroom? Use night lights. Make sure that you have a light by your bed that is easy to reach. Do not use any sheets or blankets that are too big for your bed. They should not hang down onto the floor. Have a firm chair that has side arms. You can use this for support while you get dressed. Do not have throw rugs and other things on the floor that can make you trip. What can I do in the kitchen? Clean  up any spills right away. Avoid walking on wet floors. Keep items that you use a lot in easy-to-reach places. If you need to reach something above you, use a strong step stool that has a grab bar. Keep electrical cords out of the way. Do not use floor polish or wax that makes floors slippery. If you must use wax, use non-skid floor wax. Do not have throw rugs and other things on the floor that can make you trip. What can I do with my stairs? Do not leave any items on the stairs. Make sure that there are handrails on both sides of the stairs and use them. Fix handrails that are broken or loose. Make sure that handrails are as long as the stairways. Check any carpeting to make sure  that it is firmly attached to the stairs. Fix any carpet that is loose or worn. Avoid having throw rugs at the top or bottom of the stairs. If you do have throw rugs, attach them to the floor with carpet tape. Make sure that you have a light switch at the top of the stairs and the bottom of the stairs. If you do not have them, ask someone to add them for you. What else can I do to help prevent falls? Wear shoes that: Do not have high heels. Have rubber bottoms. Are comfortable and fit you well. Are closed at the toe. Do not wear sandals. If you use a stepladder: Make sure that it is fully opened. Do not climb a closed stepladder. Make sure that both sides of the stepladder are locked into place. Ask someone to hold it for you, if possible. Clearly mark and make sure that you can see: Any grab bars or handrails. First and last steps. Where the edge of each step is. Use tools that help you move around (mobility aids) if they are needed. These include: Canes. Walkers. Scooters. Crutches. Turn on the lights when you go into a dark area. Replace any light bulbs as soon as they burn out. Set up your furniture so you have a clear path. Avoid moving your furniture around. If any of your floors are uneven, fix them. If there are any pets around you, be aware of where they are. Review your medicines with your doctor. Some medicines can make you feel dizzy. This can increase your chance of falling. Ask your doctor what other things that you can do to help prevent falls. This information is not intended to replace advice given to you by your health care provider. Make sure you discuss any questions you have with your health care provider. Document Released: 12/06/2008 Document Revised: 07/18/2015 Document Reviewed: 03/16/2014 Elsevier Interactive Patient Education  2017 [employer].

## 2020-12-20 DIAGNOSIS — M47816 Spondylosis without myelopathy or radiculopathy, lumbar region: Secondary | ICD-10-CM | POA: Diagnosis not present

## 2020-12-20 DIAGNOSIS — M47896 Other spondylosis, lumbar region: Secondary | ICD-10-CM | POA: Diagnosis not present

## 2020-12-23 DIAGNOSIS — F3162 Bipolar disorder, current episode mixed, moderate: Secondary | ICD-10-CM | POA: Diagnosis not present

## 2020-12-25 DIAGNOSIS — F250 Schizoaffective disorder, bipolar type: Secondary | ICD-10-CM | POA: Diagnosis not present

## 2020-12-27 ENCOUNTER — Telehealth: Payer: Medicare Other | Admitting: *Deleted

## 2020-12-27 ENCOUNTER — Telehealth: Payer: Self-pay | Admitting: *Deleted

## 2020-12-27 NOTE — Telephone Encounter (Signed)
  Care Management   Follow Up Note   12/27/2020 Name: [name] MRN: [id number] DOB: Jul 04, 1975   Referred by: Susy Frizzle, MD Reason for referral : Chronic Care Management (Unsuccessful RNCM Follow Up/)   An unsuccessful telephone outreach was attempted today. The patient was referred to the case management team for assistance with care management and care coordination.   Follow Up Plan:  Unable to leave a voicemail. Forwarding to [facility] Care Guide for outreach and rescheduling.    Chong Sicilian, BSN, RN-BC Embedded Chronic Care Manager [facility] / [place] Management Direct Dial: [phone]

## 2020-12-30 DIAGNOSIS — D860 Sarcoidosis of lung: Secondary | ICD-10-CM | POA: Diagnosis not present

## 2020-12-30 DIAGNOSIS — F909 Attention-deficit hyperactivity disorder, unspecified type: Secondary | ICD-10-CM | POA: Diagnosis not present

## 2020-12-30 DIAGNOSIS — F209 Schizophrenia, unspecified: Secondary | ICD-10-CM | POA: Diagnosis not present

## 2020-12-30 DIAGNOSIS — I10 Essential (primary) hypertension: Secondary | ICD-10-CM | POA: Diagnosis not present

## 2020-12-30 DIAGNOSIS — F3162 Bipolar disorder, current episode mixed, moderate: Secondary | ICD-10-CM | POA: Diagnosis not present

## 2020-12-30 DIAGNOSIS — D509 Iron deficiency anemia, unspecified: Secondary | ICD-10-CM | POA: Diagnosis not present

## 2020-12-30 DIAGNOSIS — F419 Anxiety disorder, unspecified: Secondary | ICD-10-CM | POA: Diagnosis not present

## 2021-01-03 ENCOUNTER — Encounter: Payer: Self-pay | Admitting: Emergency Medicine

## 2021-01-03 ENCOUNTER — Other Ambulatory Visit: Payer: Self-pay

## 2021-01-03 ENCOUNTER — Telehealth: Payer: Self-pay | Admitting: *Deleted

## 2021-01-03 ENCOUNTER — Ambulatory Visit (INDEPENDENT_AMBULATORY_CARE_PROVIDER_SITE_OTHER): Payer: Medicare Other | Admitting: Emergency Medicine

## 2021-01-03 DIAGNOSIS — D869 Sarcoidosis, unspecified: Secondary | ICD-10-CM | POA: Diagnosis not present

## 2021-01-03 NOTE — Assessment & Plan Note (Signed)
No rash, stable cough and exertional dyspnea (minimal).  Overall doing well.  I do not think her sarcoidosis is active.  Continue her albuterol as needed.  We reviewed her CT scan of the chest and it was reassuring, unchanged parenchymal disease and nodules.  Her lymphadenopathy is actually smaller.  We will plan to check an ACE level next time.  Follow-up in 6 months or as needed.

## 2021-01-03 NOTE — Chronic Care Management (AMB) (Signed)
  Care Management   Note  01/03/2021 Name: JOVANNA HODGES MRN: [id number] DOB: 1975/06/25  EMIRETH COCKERHAM is a 45 y.o. year old female who is a primary care patient of Susy Frizzle, MD and is actively engaged with the care management team. I reached out to Lynnell Chad by phone today to assist with re-scheduling a follow up visit with the RN Case Manager  Follow up plan: Unsuccessful telephone outreach attempt made. The care management team will reach out to the patient again over the next 7 days. If patient returns call to provider office, please advise to call [facility] at [phone].  [place] Management  Direct Dial: [phone]

## 2021-01-03 NOTE — Progress Notes (Signed)
   Subjective:    Patient ID: [name], female    DOB: Aug 20, 1975, 45 y.o.   MRN: [id number]  HPI  ROV 09/26/20 --this is a follow-up visit 45 year old woman diagnosed with sarcoidosis by endobronchial ultrasound 03/2019, mixed obstruction and restriction on pulmonary function testing 04/2019.  She also has a past medical history significant for bipolar, diverticular perforation requiring repair and colostomy complicated by subcutaneous abscess/seroma.  OSA not on CPAP, cerebellar CVA.  She had some increased cough, hemoptysis in November 2021 that prompted CT-PA.  No pulmonary embolism, stable bilateral pulmonary nodular disease, some increased hilar mediastinal adenopathy.  She was treated with antibiotics and prednisone with some improvement.  That was her last imaging of the chest.  She is not currently on any bronchodilator therapy. She still has a wound vac and may need repeat sgy for this. She has also been in/out of Behavioral Health She reports that her functional capacity is good, has good functional capacity. She has been vaping, is interested in stopping. She knows that her sx are more labile in the Fall - more SOB, cough.    ROV 01/03/21 --follow-up visit 45 year old woman with history of obesity, sarcoidosis and associated mixed obstruction and restrictive lung disease.  Past medical history also significant for OSA not on CPAP, cerebellar CVA, bipolar.  We repeated her CT chest for surveillance as below.  Today she reports that she has been doing well overall. Good functional capacity. She is dealing with back pain, mid back myalgias. She uses albuterol about 1-2x a month.   CT chest 12/17/2020 reviewed by me, shows bilateral hilar mediastinal adenopathy, slightly smaller than prior, stable scattered bilateral pulmonary nodules, largest 7 mm in the right lower lobe, small bilateral effusions   Review of Systems As per HPI     Objective:   Physical Exam  Vitals:   01/03/21  1127  BP: 118/78  Pulse: (!) 104  Temp: 98.1 F (36.7 C)  TempSrc: Oral  SpO2: 95%  Weight: 260 lb 12.8 oz (118.3 kg)  Height: 5\' 6"  (1.676 m)    Gen: Pleasant, obese woman, in no distress  ENT: No lesions,  mouth clear,  oropharynx clear, narrow post pharynx, no postnasal drip  Neck: No JVD, no stridor, strong voice  Lungs: No use of accessory muscles, no crackles or wheezing on normal respiration, no wheeze on forced expiration  Cardiovascular: RRR, heart sounds normal, no murmur or gallops, no peripheral edema  Musculoskeletal: No deformities, no cyanosis or clubbing  Neuro: alert, awake, non-focal  Skin: Warm, no lesions or rash      Assessment & Plan:  Sarcoidosis No rash, stable cough and exertional dyspnea (minimal).  Overall doing well.  I do not think her sarcoidosis is active.  Continue her albuterol as needed.  We reviewed her CT scan of the chest and it was reassuring, unchanged parenchymal disease and nodules.  Her lymphadenopathy is actually smaller.  We will plan to check an ACE level next time.  Follow-up in 6 months or as needed.  Baltazar Apo, MD, PhD 01/03/2021, 11:42 AM [facility] [phone] or if no answer [phone]

## 2021-01-03 NOTE — Patient Instructions (Signed)
We reviewed your CT scan of the chest today.  This is stable.  Good news. We will check blood work at your next office visit. Keep your albuterol available to use 2 puffs when needed for shortness of breath, chest tightness, wheezing. Follow Dr. Lamonte Sakai in 6 months or sooner if you have any new problems.

## 2021-01-06 DIAGNOSIS — G894 Chronic pain syndrome: Secondary | ICD-10-CM | POA: Diagnosis not present

## 2021-01-10 NOTE — Chronic Care Management (AMB) (Signed)
  Care Management   Note  01/10/2021 Name: [name] MRN: [id number] DOB: [birth year]  [name] is a 45 y.o. year old female who is a primary care patient of Dennard Schaumann, Cammie Mcgee, MD and is actively engaged with the care management team. I reached out to Lynnell Chad by phone today to assist with re-scheduling a follow up visit with the RN Case Manager  Follow up plan: Telephone appointment with care management team member scheduled for:01/20/21  [place] Management  Direct Dial: [phone]

## 2021-01-14 ENCOUNTER — Other Ambulatory Visit: Payer: Self-pay | Admitting: Family Medicine

## 2021-01-20 ENCOUNTER — Ambulatory Visit (INDEPENDENT_AMBULATORY_CARE_PROVIDER_SITE_OTHER): Payer: Medicare Other | Admitting: *Deleted

## 2021-01-20 DIAGNOSIS — M5416 Radiculopathy, lumbar region: Secondary | ICD-10-CM | POA: Insufficient documentation

## 2021-01-20 DIAGNOSIS — D869 Sarcoidosis, unspecified: Secondary | ICD-10-CM

## 2021-01-20 DIAGNOSIS — F3162 Bipolar disorder, current episode mixed, moderate: Secondary | ICD-10-CM

## 2021-01-20 NOTE — Patient Instructions (Signed)
Visit Information  Thank you for taking time to visit with me today. Please don't hesitate to contact me if I can be of assistance to you before our next scheduled telephone appointment.  Following are the goals we discussed today:  Take medications as prescribed   Attend all scheduled provider appointments Perform all self care activities independently  Perform IADL's (shopping, preparing meals, housekeeping, managing finances) independently Call provider office for new concerns or questions  Try to cut down or quit vaping Make appointment with psychologist- use the list you have and psychiatrist's input for this Eat adequate protein in diet to help with wound healing Drink adequate fluids Avoid spicy, greasy foods Get outdoors daily if possible, try walking for exercise  Our next appointment is by telephone on 03/24/2021 at 330 pm  Please call the care guide team at [phone] if you need to cancel or reschedule your appointment.   If you are experiencing a Mental Health or [place] or need someone to talk to, please call the [place] National Suicide Prevention Lifeline: 289-607-2224 or TTY: 508-080-6499 TTY [phone]) to talk to a trained counselor call 1-800-273-TALK (toll free, 24 hour hotline) go to [facility] [address] [place] [phone]) call the [place]: [phone] call 911   The patient verbalized understanding of instructions, educational materials, and care plan provided today and declined offer to receive copy of patient instructions, educational materials, and care plan.   Jacqlyn Larsen RNC, BSN RN Case Manager [facility] [phone]

## 2021-01-20 NOTE — Chronic Care Management (AMB) (Signed)
Chronic Care Management   CCM RN Visit Note  01/20/2021 Name: [name] MRN: [id number] DOB: 11/02/1975  Subjective: [name] is a 45 y.o. year old female who is a primary care patient of Pickard, Cammie Mcgee, MD. The care management team was consulted for assistance with disease management and care coordination needs.    Engaged with patient by telephone for follow up visit in response to provider referral for case management and/or care coordination services.   Consent to Services:  The patient was given information about Chronic Care Management services, agreed to services, and gave verbal consent prior to initiation of services.  Please see initial visit note for detailed documentation.   Patient agreed to services and verbal consent obtained.   Assessment: Review of patient past medical history, allergies, medications, health status, including review of consultants reports, laboratory and other test data, was performed as part of comprehensive evaluation and provision of chronic care management services.   SDOH (Social Determinants of Health) assessments and interventions performed:    CCM Care Plan  No Known Allergies  Outpatient Encounter Medications as of 01/20/2021  Medication Sig   albuterol (VENTOLIN HFA) 108 (90 Base) MCG/ACT inhaler Inhale 2 puffs into the lungs every 6 (six) hours as needed for wheezing or shortness of breath.   benztropine (COGENTIN) 2 MG tablet Take 2 mg by mouth 2 (two) times daily.   hydrOXYzine (ATARAX/VISTARIL) 50 MG tablet Take 50 mg by mouth every 3 (three) hours as needed.   loperamide (IMODIUM) 2 MG capsule Take by mouth. 2 tablets in the morning and 1 in the evenings   MIRENA, 52 MG, 20 MCG/DAY IUD once.   QUEtiapine (SEROQUEL) 300 MG tablet Take 800 mg by mouth at bedtime.   risperiDONE (RISPERDAL) 2 MG tablet Take 2 mg by mouth in the morning, at noon, and at bedtime.   Suvorexant 20 MG TABS Take 20 mg by mouth at bedtime.    valACYclovir (VALTREX) 500 MG tablet TAKE 1 TABLET (500 MG TOTAL) BY MOUTH DAILY AS NEEDED (FEVER BLISTER).   No facility-administered encounter medications on file as of 01/20/2021.    Patient Active Problem List   Diagnosis Date Noted   Fecal incontinence 10/04/2020   OSA (obstructive sleep apnea) 10/04/2020   PONV (postoperative nausea and vomiting) 10/04/2020   Hx of hiatal hernia 09/13/2020   Sedative, hypnotic or anxiolytic use disorder, severe, in controlled environment ([place]) 09/11/2020   Lumbar spondylosis 08/29/2020   Lumbar pain 08/20/2020   Pain 03/08/2020   Overdose 03/08/2020   Iron deficiency anemia due to chronic blood loss 11/09/2019   Sarcoidosis 04/26/2019   Abnormal CT of the chest 03/23/2019   Dyspnea 03/23/2019   Hypokalemia 02/24/2019   Sepsis ([place]) 02/24/2019   Cerebellar infarct ([place]) 11/16/2018   History of CVA (cerebrovascular accident) 11/16/2018   CVA (cerebrovascular accident) [facility])    Degeneration of lumbar or lumbosacral intervertebral disc 12/13/2014   Anemia associated with nutritional deficiency 11/12/2014   Bipolar affective disorder ([place]) 09/28/2014   Anxiety state 09/28/2014   S/P laparoscopic sleeve gastrectomy 01/2014 09/28/2014   Abdominal wall seroma - chronic    Seroma    Cholelithiases 04/25/2013   Cough 09/19/2011   Smoker 09/19/2011   GERD 03/04/2010   RECTAL BLEEDING 03/04/2010   DYSPHAGIA UNSPECIFIED 03/04/2010   DYSPHAGIA 03/04/2010   RUQ PAIN 03/04/2010    Conditions to be addressed/monitored:Bipolar Disorder  GERD, sarcoidosis  Care Plan : RN Care Manager plan of care  Updates made by Kassie Mends, RN since 01/20/2021 12:00 AM     Problem: No plan of care established for management of chronic disease states (Sarcoidosis, GERD, Bipolar)   Priority: High     Long-Range Goal: Development of plan of care for chronic disease management (Sarcoidosis, GERD, Bipolar)   Start Date: 01/20/2021  Expected End Date:  07/19/2021  Priority: High  Note:   Current Barriers:  Knowledge Deficits related to plan of care for management of Sarcoidosis, GERD, Bipolar  Ineffective Self Health Maintenance in a patient with Sarcoidosis Pulmonary Disease/ GERD/ Bipolar. Patient reports she lives alone and splits her time between her apartment and her mother's house, pt reports she is independent with all aspects of her care, continues to drive. Pt states she frequently sees Lendon Colonel psychiatrist at [facility] in [place] (to see tomorrow at 01/21/21) also receives ECT therapy in [place] for her bipolar disorder, pt denies depression today stating the medication she is taking has helped tremendously but causes her to have increased appetite.  Pt wants to see a psychologist/ counselor but wants this to be in [place], [facility] or with [facility] (not [facility]) and states she has not ask her doctor to complete a referral- (per patients primary care provider psychiatrist will need to do this referral)  Pt reports she now has a list of psychologists but has not pursued checking on but plans to. Pt has wound vac with RN visiting from [place], pt has abdominal wound related to years ago colon rupture and hernia repair which led to other issues. Does not adhere to provider recommendations re: patient continues to vape nicotine and unable to quit at present Does not contact provider office for questions/concerns  RNCM Clinical Goal(s):  Patient will verbalize understanding of plan for management of Bipolar Disorder and GERD, Sarcoidosis  as evidenced by patient report, review EHR and  through collaboration with RN Care manager, provider, and care team.   Interventions: 1:1 collaboration with primary care provider regarding development and update of comprehensive plan of care as evidenced by provider attestation and co-signature Inter-disciplinary care team collaboration (see longitudinal plan of  care) Evaluation of current treatment plan related to  self management and patient's adherence to plan as established by provider   Sarcoidosis, Bipolar, GERD Health Maintenance Interventions:  (Status:  Goal on track:  Yes.) Long Term Goal Provided education about importance of contacting psychologist from the list patient has   Encouraged patient to get outside daily and do some type of exercise such as walking Reviewed importance of good handwashing and wearing a mask as needed Reviewed importance of taking medications as prescribed Encouraged patient to decrease/ stop vaping Reviewed importance of proper nutrition including adequate protein for wound healing Reviewed importance of avoiding spicy, greasy foods  Patient Goals/Self-Care Activities: Take medications as prescribed   Attend all scheduled provider appointments Perform all self care activities independently  Perform IADL's (shopping, preparing meals, housekeeping, managing finances) independently Call provider office for new concerns or questions  Try to cut down or quit vaping Make appointment with psychologist- use the list you have and psychiatrist's input for this Eat adequate protein in diet to help with wound healing Drink adequate fluids Avoid spicy, greasy foods Get outdoors daily if possible, try walking for exercise  Follow Up Plan:  Telephone follow up appointment with care management team member scheduled for:  03/24/2021      Plan:Telephone follow up appointment with care  management team member scheduled for:  03/24/2021   Jacqlyn Larsen [facility], BSN RN Case Manager [facility] [phone]

## 2021-01-21 DIAGNOSIS — Z8659 Personal history of other mental and behavioral disorders: Secondary | ICD-10-CM | POA: Diagnosis not present

## 2021-01-21 DIAGNOSIS — Z8709 Personal history of other diseases of the respiratory system: Secondary | ICD-10-CM | POA: Diagnosis not present

## 2021-01-21 DIAGNOSIS — F4010 Social phobia, unspecified: Secondary | ICD-10-CM | POA: Diagnosis not present

## 2021-01-21 DIAGNOSIS — R251 Tremor, unspecified: Secondary | ICD-10-CM | POA: Diagnosis not present

## 2021-01-21 DIAGNOSIS — Z23 Encounter for immunization: Secondary | ICD-10-CM | POA: Diagnosis not present

## 2021-01-21 DIAGNOSIS — F3164 Bipolar disorder, current episode mixed, severe, with psychotic features: Secondary | ICD-10-CM | POA: Diagnosis not present

## 2021-01-22 DIAGNOSIS — M5459 Other low back pain: Secondary | ICD-10-CM | POA: Diagnosis not present

## 2021-01-22 DIAGNOSIS — M5416 Radiculopathy, lumbar region: Secondary | ICD-10-CM | POA: Diagnosis not present

## 2021-01-29 DIAGNOSIS — I10 Essential (primary) hypertension: Secondary | ICD-10-CM | POA: Diagnosis not present

## 2021-01-29 DIAGNOSIS — F250 Schizoaffective disorder, bipolar type: Secondary | ICD-10-CM | POA: Diagnosis not present

## 2021-02-05 DIAGNOSIS — R635 Abnormal weight gain: Secondary | ICD-10-CM | POA: Diagnosis not present

## 2021-02-11 DIAGNOSIS — S31109D Unspecified open wound of abdominal wall, unspecified quadrant without penetration into peritoneal cavity, subsequent encounter: Secondary | ICD-10-CM | POA: Diagnosis not present

## 2021-02-11 DIAGNOSIS — F3164 Bipolar disorder, current episode mixed, severe, with psychotic features: Secondary | ICD-10-CM | POA: Diagnosis not present

## 2021-02-11 DIAGNOSIS — Z6838 Body mass index (BMI) 38.0-38.9, adult: Secondary | ICD-10-CM | POA: Diagnosis not present

## 2021-02-11 DIAGNOSIS — D869 Sarcoidosis, unspecified: Secondary | ICD-10-CM | POA: Diagnosis not present

## 2021-02-11 DIAGNOSIS — E669 Obesity, unspecified: Secondary | ICD-10-CM | POA: Diagnosis not present

## 2021-02-11 DIAGNOSIS — E46 Unspecified protein-calorie malnutrition: Secondary | ICD-10-CM | POA: Diagnosis not present

## 2021-02-18 DIAGNOSIS — M5412 Radiculopathy, cervical region: Secondary | ICD-10-CM | POA: Diagnosis not present

## 2021-02-18 DIAGNOSIS — M5459 Other low back pain: Secondary | ICD-10-CM | POA: Diagnosis not present

## 2021-02-25 ENCOUNTER — Other Ambulatory Visit: Payer: Self-pay

## 2021-02-25 ENCOUNTER — Ambulatory Visit
Admission: RE | Admit: 2021-02-25 | Discharge: 2021-02-25 | Disposition: A | Discharge status: Home / Self Care | Payer: Medicare Other | Source: Ambulatory Visit

## 2021-02-25 VITALS — BP 132/89 | HR 99 | Temp 98.0°F | Resp 20

## 2021-02-25 DIAGNOSIS — R519 Headache, unspecified: Secondary | ICD-10-CM

## 2021-02-25 DIAGNOSIS — R062 Wheezing: Secondary | ICD-10-CM

## 2021-02-25 DIAGNOSIS — D869 Sarcoidosis, unspecified: Secondary | ICD-10-CM

## 2021-02-25 DIAGNOSIS — R0981 Nasal congestion: Secondary | ICD-10-CM

## 2021-02-25 DIAGNOSIS — R052 Subacute cough: Secondary | ICD-10-CM

## 2021-02-25 DIAGNOSIS — J988 Other specified respiratory disorders: Secondary | ICD-10-CM | POA: Diagnosis not present

## 2021-02-25 DIAGNOSIS — B9789 Other viral agents as the cause of diseases classified elsewhere: Secondary | ICD-10-CM | POA: Diagnosis not present

## 2021-02-25 MED ORDER — PROMETHAZINE-DM 6.25-15 MG/5ML PO SYRP: 5.0000 mL | ORAL_SOLUTION | Freq: Every evening | ORAL | 0 refills | Status: DC | PRN

## 2021-02-25 MED ORDER — BENZONATATE 100 MG PO CAPS: 100.0000 mg | ORAL_CAPSULE | Freq: Three times a day (TID) | ORAL | 0 refills | Status: DC | PRN

## 2021-02-25 MED ORDER — METHYLPREDNISOLONE SODIUM SUCC 125 MG IJ SOLR
125.0000 mg | Freq: Once | INTRAMUSCULAR | Status: AC
  Administered 2021-02-25: 125 mg via INTRAMUSCULAR

## 2021-02-25 MED ORDER — PREDNISONE 20 MG PO TABS: ORAL_TABLET | ORAL | 0 refills | Status: DC

## 2021-02-25 NOTE — ED Provider Notes (Signed)
[name]   MRN: [id number] DOB: 1975/10/24  Subjective:   [name] is a 46 y.o. female presenting for 2-day history of acute onset recurrent sinus congestion, postnasal drainage, wheezing, coughing, shortness of breath.  Patient has a history of sarcoidosis and usually responds very well to prednisone.  She would like to have this.  Does not want any COVID or flu testing.  She gets regular follow-up for her sarcoidosis, has chest CT scans twice yearly.  No current facility-administered medications for this encounter.  Current Outpatient Medications:    albuterol (VENTOLIN HFA) 108 (90 Base) MCG/ACT inhaler, Inhale 2 puffs into the lungs every 6 (six) hours as needed for wheezing or shortness of breath., Disp: 8 g, Rfl: 6   benztropine (COGENTIN) 2 MG tablet, Take 2 mg by mouth 2 (two) times daily., Disp: , Rfl:    gabapentin (NEURONTIN) 300 MG capsule, Take 300 mg by mouth 3 (three) times daily., Disp: , Rfl:    HYDROcodone-acetaminophen (NORCO) 10-325 MG tablet, hydrocodone 10 mg-acetaminophen 325 mg tablet  Take 1 tablet 3 times a day by oral route as needed for pain., Disp: , Rfl:    hydrOXYzine (ATARAX/VISTARIL) 50 MG tablet, Take 50 mg by mouth every 3 (three) hours as needed., Disp: , Rfl:    loperamide (IMODIUM) 2 MG capsule, Take by mouth. 2 tablets in the morning and 1 in the evenings, Disp: , Rfl:    MIRENA, 52 MG, 20 MCG/DAY IUD, once., Disp: , Rfl:    QUEtiapine (SEROQUEL) 300 MG tablet, Take 800 mg by mouth at bedtime., Disp: , Rfl:    risperiDONE (RISPERDAL) 2 MG tablet, Take 2 mg by mouth in the morning, at noon, and at bedtime., Disp: , Rfl:    Suvorexant 20 MG TABS, Take 20 mg by mouth at bedtime., Disp: , Rfl:    valACYclovir (VALTREX) 500 MG tablet, TAKE 1 TABLET (500 MG TOTAL) BY MOUTH DAILY AS NEEDED (FEVER BLISTER)., Disp: 90 tablet, Rfl: 2   No Known Allergies  Past Medical History:  Diagnosis Date   ADHD    Anal fistula    Anxiety     Arthritis    pt denies 5/3 visit    Bipolar affective ([place])    Blood transfusion without reported diagnosis    Colostomy in place [facility])    CVA (cerebrovascular accident) ([place])    left cerebellar infarct found accidentally on CT (2020)    Depression    Diverticulitis 2008   perforated/ requiring resection   Fatty liver    Gallstones    GERD (gastroesophageal reflux disease)    occ.   Headache    after a fall   Herpes simplex    History of colon polyps    History of hiatal hernia    small   History of kidney stones    cystoscopy  basket removal   Hypertension    Iron deficiency anemia    Obese    OCD (obsessive compulsive disorder)    Panic attacks    PONV (postoperative nausea and vomiting) 10/04/2020   Pulmonary nodules    Sarcoidosis    Schizophrenia ([place])    Shortness of breath dyspnea    pt denies on 5/3 visit    Sleep apnea    mild, does not use c-pap machine   Substance abuse ([place])    12 years ago-crack cocaine     Past Surgical History:  Procedure Laterality Date   Abdominal wall hernia  07/2007   open repair with lysis of adhesions   APPLICATION OF WOUND VAC N/A 06/29/2019   Procedure: PLACEMENT OF WOUND VAC;  Surgeon: Greer Pickerel, MD;  Location: [facility];  Service: General;  Laterality: N/A;   BRONCHIAL BRUSHINGS  04/11/2019   Procedure: BRONCHIAL BRUSHINGS;  Surgeon: Collene Gobble, MD;  Location: [facility] ENDOSCOPY;  Service: Pulmonary;;   BRONCHIAL WASHINGS  04/11/2019   Procedure: BRONCHIAL WASHINGS;  Surgeon: Collene Gobble, MD;  Location: [facility] ENDOSCOPY;  Service: Pulmonary;;   COLONOSCOPY  03/13/2010   [id number] hemorrhoids likely cause of hematochezia, otherwise normal   COLONOSCOPY  05/24/2019   COLONOSCOPY WITH ESOPHAGOGASTRODUODENOSCOPY (EGD)  04/2019   Colostomy reversal  11/2006   DEBRIDEMENT OF ABDOMINAL WALL ABSCESS N/A 06/29/2019   Procedure: EXCISIONAL DEBRIDEMENT OF ABDOMINAL WALL/SUBCU SEROMA;  Surgeon: Greer Pickerel, MD;  Location: [facility];   Service: General;  Laterality: N/A;   ESOPHAGOGASTRODUODENOSCOPY  03/13/2010   [id number] esophagus, status post passage of a Maloney dilator/Small hiatal hernia/ Antral erosions, status post biopsy   EXAMINATION UNDER ANESTHESIA N/A 05/11/2012   Procedure: EXAM UNDER ANESTHESIA;  Surgeon: Donato Heinz, MD;  Location: [facility];  Service: General;  Laterality: N/A;   Exploratory laparotomy with resection  2008   colonoscopy   FINE NEEDLE ASPIRATION  04/11/2019   Procedure: FINE NEEDLE ASPIRATION (FNA) LINEAR;  Surgeon: Collene Gobble, MD;  Location: [facility];  Service: Pulmonary;;   FINGER CLOSED REDUCTION Right 08/05/2012   Procedure: CLOSED REDUCTION RIGHT THUMB (FINGER);  Surgeon: Linna Hoff, MD;  Location: [facility];  Service: Orthopedics;  Laterality: Right;   HERNIA REPAIR     incisional hernia surgery    Hx of abd wall seroma  08/2007   Drained via [place] in [place], [place]   Hx of abd wall seroma  10/2007   Drained by Dr. Geroge Baseman in office   [place] N/A 06/29/2019   Procedure: INCISION AND DRAINAGE;  Surgeon: Greer Pickerel, MD;  Location: [facility];  Service: General;  Laterality: N/A;   IR RADIOLOGIST EVAL & MGMT  03/15/2019   IR RADIOLOGIST EVAL & MGMT  04/06/2019   Kidney stones     LAPAROSCOPIC GASTRIC SLEEVE RESECTION N/A 02/13/2014   Procedure: LAPAROSCOPIC GASTRIC SLEEVE RESECTION LYSIS OF ADHESIONS, UPPER ENDOSCOPY;  Surgeon: [name], MD;  Location: [facility];  Service: General;  Laterality: N/A;   LAPAROSCOPY N/A 09/28/2014   Procedure: LAPAROSCOPY DIAGNOSTIC, INCISION AND DRAINAGE WITH LAPAROSCOPIC EXPLORATION OF ABDOMINAL WALL SEROMA with ultrasound;  Surgeon: Greer Pickerel, MD;  Location: [facility];  Service: General;  Laterality: N/A;   LUNG BIOPSY  04/11/2019   Procedure: LUNG BIOPSY;  Surgeon: Collene Gobble, MD;  Location: [facility] ENDOSCOPY;  Service: Pulmonary;;  distal trachea   PLACEMENT OF SETON N/A 05/11/2012   Procedure: PLACEMENT OF SETON;   Surgeon: Donato Heinz, MD;  Location: [facility];  Service: General;  Laterality: N/A;   root canal 7-16     TONSILLECTOMY     TREATMENT FISTULA ANAL     x 2   UPPER GASTROINTESTINAL ENDOSCOPY  05/24/2019   VIDEO BRONCHOSCOPY WITH ENDOBRONCHIAL ULTRASOUND N/A 04/11/2019   Procedure: VIDEO BRONCHOSCOPY WITH ENDOBRONCHIAL ULTRASOUND;  Surgeon: Collene Gobble, MD;  Location: [facility] ENDOSCOPY;  Service: Pulmonary;  Laterality: N/A;    Family History  Problem Relation Age of Onset   Asthma Maternal Grandmother    Colon polyps Maternal Grandmother    Colon cancer Maternal Grandmother  Esophageal cancer Neg Hx    Rectal cancer Neg Hx    Stomach cancer Neg Hx    Pancreatic cancer Neg Hx    Liver disease Neg Hx     Social History   Tobacco Use   Smoking status: Former    Packs/day: 0.50    Years: 14.00    Pack years: 7.00    Types: E-cigarettes, Cigarettes    Quit date: 12/20/2013    Years since quitting: 7.1   Smokeless tobacco: Never   Tobacco comments:    Vapor cigarettes.  Vaping Use   Vaping Use: Every day   Last attempt to quit: 02/24/2019   Substances: Nicotine, Flavoring   Devices: started 2015  Substance Use Topics   Alcohol use: No    Alcohol/week: 0.0 standard drinks   Drug use: Never    ROS   Objective:   Vitals: BP 132/89 (BP Location: Right Arm)    Pulse 99    Temp 98 F (36.7 C) (Oral)    Resp 20    SpO2 98%   Physical Exam Constitutional:      General: She is not in acute distress.    Appearance: Normal appearance. She is well-developed. She is not ill-appearing, toxic-appearing or diaphoretic.  HENT:     Head: Normocephalic and atraumatic.     Right Ear: Tympanic membrane, ear canal and external ear normal. No drainage or tenderness. No middle ear effusion. Tympanic membrane is not erythematous.     Left Ear: Tympanic membrane, ear canal and external ear normal. No drainage or tenderness.  No middle ear effusion. Tympanic membrane is not erythematous.      Nose: Congestion and rhinorrhea present.     Mouth/Throat:     Mouth: Mucous membranes are moist. No oral lesions.     Pharynx: No pharyngeal swelling, oropharyngeal exudate, posterior oropharyngeal erythema or uvula swelling.     Tonsils: No tonsillar exudate or tonsillar abscesses.  Eyes:     General: No scleral icterus.       Right eye: No discharge.        Left eye: No discharge.     Extraocular Movements: Extraocular movements intact.     Right eye: Normal extraocular motion.     Left eye: Normal extraocular motion.     Conjunctiva/sclera: Conjunctivae normal.  Cardiovascular:     Rate and Rhythm: Normal rate and regular rhythm.     Pulses: Normal pulses.     Heart sounds: Normal heart sounds. No murmur heard.   No friction rub. No gallop.  Pulmonary:     Effort: Pulmonary effort is normal. No respiratory distress.     Breath sounds: No stridor. Wheezing (throughout) present. No rhonchi or rales.  Musculoskeletal:     Cervical back: Normal range of motion and neck supple.  Lymphadenopathy:     Cervical: No cervical adenopathy.  Skin:    General: Skin is warm and dry.     Findings: No rash.  Neurological:     General: No focal deficit present.     Mental Status: She is alert and oriented to person, place, and time.  Psychiatric:        Mood and Affect: Mood normal.        Behavior: Behavior normal.        Thought Content: Thought content normal.        Judgment: Judgment normal.    Assessment and Plan :   PDMP not reviewed this encounter.  1. Viral respiratory illness   2. Subacute cough   3. Wheezing   4. Sinus headache   5. Sinus congestion   6. Sarcoidosis    Patient declined testing.  I suspect that she is undergoing a viral respiratory illness.  Given her history of doing well with steroids, I provide her with a prescription for this.  However she has diffuse wheezing and recommended IM Solu-Medrol in clinic.  She was agreeable. Counseled patient on  potential for adverse effects with medications prescribed/recommended today, ER and return-to-clinic precautions discussed, patient verbalized understanding.    Jaynee Eagles, [place] 02/25/21 1846

## 2021-02-25 NOTE — ED Triage Notes (Signed)
Pt reports cough  and wheezing x 1 day; nasal congestion x 1 1/2 day.   Pt do not want Flu or COVID test.

## 2021-02-26 DIAGNOSIS — M4696 Unspecified inflammatory spondylopathy, lumbar region: Secondary | ICD-10-CM | POA: Diagnosis not present

## 2021-02-26 DIAGNOSIS — M5416 Radiculopathy, lumbar region: Secondary | ICD-10-CM | POA: Diagnosis not present

## 2021-03-04 ENCOUNTER — Ambulatory Visit (INDEPENDENT_AMBULATORY_CARE_PROVIDER_SITE_OTHER): Payer: Medicare Other | Admitting: Family Medicine

## 2021-03-04 ENCOUNTER — Telehealth: Payer: Self-pay | Admitting: Emergency Medicine

## 2021-03-04 ENCOUNTER — Other Ambulatory Visit: Payer: Self-pay

## 2021-03-04 ENCOUNTER — Encounter: Payer: Self-pay | Admitting: Hematology and Oncology

## 2021-03-04 ENCOUNTER — Encounter: Payer: Self-pay | Admitting: Family Medicine

## 2021-03-04 VITALS — BP 154/106 | HR 89 | Temp 97.9°F | Ht 66.0 in | Wt 260.0 lb

## 2021-03-04 DIAGNOSIS — D500 Iron deficiency anemia secondary to blood loss (chronic): Secondary | ICD-10-CM | POA: Diagnosis not present

## 2021-03-04 DIAGNOSIS — G894 Chronic pain syndrome: Secondary | ICD-10-CM | POA: Insufficient documentation

## 2021-03-04 DIAGNOSIS — I10 Essential (primary) hypertension: Secondary | ICD-10-CM | POA: Diagnosis not present

## 2021-03-04 DIAGNOSIS — S301XXS Contusion of abdominal wall, sequela: Secondary | ICD-10-CM

## 2021-03-04 DIAGNOSIS — S301XXD Contusion of abdominal wall, subsequent encounter: Secondary | ICD-10-CM

## 2021-03-04 DIAGNOSIS — Z6841 Body Mass Index (BMI) 40.0 and over, adult: Secondary | ICD-10-CM

## 2021-03-04 DIAGNOSIS — Z8673 Personal history of transient ischemic attack (TIA), and cerebral infarction without residual deficits: Secondary | ICD-10-CM | POA: Diagnosis not present

## 2021-03-04 LAB — CBC WITH DIFFERENTIAL/PLATELET
Basophils Absolute: 0 10*3/uL (ref 0.0–0.2)
Basos: 0 %
EOS (ABSOLUTE): 0.2 10*3/uL (ref 0.0–0.4)
Eos: 3 %
Hematocrit: 40.2 % (ref 34.0–46.6)
Hemoglobin: 12.6 g/dL (ref 11.1–15.9)
Immature Grans (Abs): 0 10*3/uL (ref 0.0–0.1)
Immature Granulocytes: 1 %
Lymphocytes Absolute: 1.3 10*3/uL (ref 0.7–3.1)
Lymphs: 16 %
MCH: 25.1 pg — ABNORMAL LOW (ref 26.6–33.0)
MCHC: 31.3 g/dL — ABNORMAL LOW (ref 31.5–35.7)
MCV: 80 fL (ref 79–97)
Monocytes Absolute: 0.7 10*3/uL (ref 0.1–0.9)
Monocytes: 9 %
Neutrophils Absolute: 5.7 10*3/uL (ref 1.4–7.0)
Neutrophils: 71 %
Platelets: 367 10*3/uL (ref 150–450)
RBC: 5.01 x10E6/uL (ref 3.77–5.28)
RDW: 14.3 % (ref 11.7–15.4)
WBC: 8 10*3/uL (ref 3.4–10.8)

## 2021-03-04 MED ORDER — LISINOPRIL 20 MG PO TABS: 20.0000 mg | ORAL_TABLET | Freq: Every day | ORAL | 3 refills | Status: DC

## 2021-03-04 NOTE — Progress Notes (Signed)
Subjective:  Patient ID: [name], female    DOB: 01/06/1976, 46 y.o.   MRN: [id number]  Patient Care Team: Baruch Gouty, FNP as PCP - General (Family Medicine) Greer Pickerel, MD as Consulting Physician (General Surgery) Kassie Mends, RN as [place]   Chief Complaint:  New Patient (Initial Visit) (Weight loss)   HPI: [name] is a 46 y.o. female presenting on 03/04/2021 for New Patient (Initial Visit) (Weight loss)   Pt presents today to establish care with new PCP and for weight management. States she would like to be started on Ozempic for weight loss. She has a complex medical history and has been seen by several providers over the last several months. She was recently seen by her OB/GYN and placed on phentermine to help with weight loss. She has bipolar disorder and is managed by psychiatry. She has an ongoing open abdominal wound post seroma removal. She is seen by [facility] on a regular basis for wound management. States she was told she can not have surgery to have wound repaired until she looses at least lbs. She has hypertension and history of prior CVA. States she took herself off of her Palvix and blood pressure medications because she felt she no longer needed them. She has IDA and is not on iron repletion therapy. She has chronic pain syndrome and goes to pain management.    Relevant past medical, surgical, family, and social history reviewed and updated as indicated.  Allergies and medications reviewed and updated. Data reviewed: Chart in Epic.   Past Medical History:  Diagnosis Date   ADHD    Anal fistula    Anxiety    Arthritis    pt denies 5/3 visit    Bipolar affective ([place])    Blood transfusion without reported diagnosis    Colostomy in place [facility])    CVA (cerebrovascular accident) ([place])    left cerebellar infarct found accidentally on CT (2020)    Depression    Diverticulitis 2008   perforated/ requiring  resection   Fatty liver    Gallstones    GERD (gastroesophageal reflux disease)    occ.   Headache    after a fall   Herpes simplex    History of colon polyps    History of hiatal hernia    small   History of kidney stones    cystoscopy  basket removal   Hypertension    Iron deficiency anemia    Obese    OCD (obsessive compulsive disorder)    Panic attacks    PONV (postoperative nausea and vomiting) 10/04/2020   Pulmonary nodules    Sarcoidosis    Schizophrenia ([place])    Shortness of breath dyspnea    pt denies on 5/3 visit    Sleep apnea    mild, does not use c-pap machine   Substance abuse ([place])    12 years ago-crack cocaine    Past Surgical History:  Procedure Laterality Date   Abdominal wall hernia  07/2007   open repair with lysis of adhesions   APPLICATION OF WOUND VAC N/A 06/29/2019   Procedure: PLACEMENT OF WOUND VAC;  Surgeon: Greer Pickerel, MD;  Location: [facility];  Service: General;  Laterality: N/A;   BRONCHIAL BRUSHINGS  04/11/2019   Procedure: BRONCHIAL BRUSHINGS;  Surgeon: Collene Gobble, MD;  Location: [facility] ENDOSCOPY;  Service: Pulmonary;;   BRONCHIAL WASHINGS  04/11/2019   Procedure: BRONCHIAL WASHINGS;  Surgeon: Collene Gobble, MD;  Location: [facility] ENDOSCOPY;  Service: Pulmonary;;   COLONOSCOPY  03/13/2010   TSV:[id number] hemorrhoids likely cause of hematochezia, otherwise normal   COLONOSCOPY  05/24/2019   COLONOSCOPY WITH ESOPHAGOGASTRODUODENOSCOPY (EGD)  04/2019   Colostomy reversal  11/2006   DEBRIDEMENT OF ABDOMINAL WALL ABSCESS N/A 06/29/2019   Procedure: EXCISIONAL DEBRIDEMENT OF ABDOMINAL WALL/SUBCU SEROMA;  Surgeon: Greer Pickerel, MD;  Location: [facility];  Service: General;  Laterality: N/A;   ESOPHAGOGASTRODUODENOSCOPY  03/13/2010   SPQ:[id number] esophagus, status post passage of a Maloney dilator/Small hiatal hernia/ Antral erosions, status post biopsy   EXAMINATION UNDER ANESTHESIA N/A 05/11/2012   Procedure: EXAM UNDER ANESTHESIA;  Surgeon: Donato Heinz, MD;  Location: [facility];  Service: General;  Laterality: N/A;   Exploratory laparotomy with resection  2008   colonoscopy   FINE NEEDLE ASPIRATION  04/11/2019   Procedure: FINE NEEDLE ASPIRATION (FNA) LINEAR;  Surgeon: Collene Gobble, MD;  Location: [place] ENDOSCOPY;  Service: Pulmonary;;   FINGER CLOSED REDUCTION Right 08/05/2012   Procedure: CLOSED REDUCTION RIGHT THUMB (FINGER);  Surgeon: Linna Hoff, MD;  Location: [place];  Service: Orthopedics;  Laterality: Right;   HERNIA REPAIR     incisional hernia surgery    Hx of abd wall seroma  08/2007   Drained via [place] in [place]   Hx of abd wall seroma  10/2007   Drained by Dr. Geroge Baseman in office   [place] N/A 06/29/2019   Procedure: INCISION AND DRAINAGE;  Surgeon: Greer Pickerel, MD;  Location: [facility];  Service: General;  Laterality: N/A;   IR RADIOLOGIST EVAL & MGMT  03/15/2019   IR RADIOLOGIST EVAL & MGMT  04/06/2019   Kidney stones     LAPAROSCOPIC GASTRIC SLEEVE RESECTION N/A 02/13/2014   Procedure: LAPAROSCOPIC GASTRIC SLEEVE RESECTION LYSIS OF ADHESIONS, UPPER ENDOSCOPY;  Surgeon: [name], MD;  Location: [facility];  Service: General;  Laterality: N/A;   LAPAROSCOPY N/A 09/28/2014   Procedure: LAPAROSCOPY DIAGNOSTIC, INCISION AND DRAINAGE WITH LAPAROSCOPIC EXPLORATION OF ABDOMINAL WALL SEROMA with ultrasound;  Surgeon: Greer Pickerel, MD;  Location: [facility];  Service: General;  Laterality: N/A;   LUNG BIOPSY  04/11/2019   Procedure: LUNG BIOPSY;  Surgeon: Collene Gobble, MD;  Location: [facility] ENDOSCOPY;  Service: Pulmonary;;  distal trachea   PLACEMENT OF SETON N/A 05/11/2012   Procedure: PLACEMENT OF SETON;  Surgeon: Donato Heinz, MD;  Location: [facility];  Service: General;  Laterality: N/A;   root canal 7-16     TONSILLECTOMY     TREATMENT FISTULA ANAL     x 2   UPPER GASTROINTESTINAL ENDOSCOPY  05/24/2019   VIDEO BRONCHOSCOPY WITH ENDOBRONCHIAL ULTRASOUND N/A 04/11/2019   Procedure: VIDEO BRONCHOSCOPY WITH  ENDOBRONCHIAL ULTRASOUND;  Surgeon: Collene Gobble, MD;  Location: [facility] ENDOSCOPY;  Service: Pulmonary;  Laterality: N/A;    Social History   Socioeconomic History   Marital status: Single    Spouse name: Not on file   Number of children: 2   Years of education: 12    Highest education level: Not on file  Occupational History   Occupation: Insurance claims handler: UNEMPLOYED  Tobacco Use   Smoking status: Former    Packs/day: 0.50    Years: 14.00    Pack years: 7.00    Types: E-cigarettes, Cigarettes    Quit date: 12/20/2013    Years since quitting: 7.2   Smokeless tobacco: Never  Tobacco comments:    Vapor cigarettes.  Vaping Use   Vaping Use: Every day   Last attempt to quit: 02/24/2019   Substances: Nicotine, Flavoring   Devices: started 2015  Substance and Sexual Activity   Alcohol use: No    Alcohol/week: 0.0 standard drinks   Drug use: Never   Sexual activity: Not Currently    Birth control/protection: Abstinence  Other Topics Concern   Not on file  Social History Narrative   2 children   Social Determinants of Health   Financial Resource Strain: Low Risk    Difficulty of Paying Living Expenses: Not hard at all  Food Insecurity: No Food Insecurity   Worried About Charity fundraiser in the Last Year: Never true   Ran Out of Food in the Last Year: Never true  Transportation Needs: No Transportation Needs   Lack of Transportation (Medical): No   Lack of Transportation (Non-Medical): No  Physical Activity: Inactive   Days of Exercise per Week: 0 days   Minutes of Exercise per Session: 0 min  Stress: No Stress Concern Present   Feeling of Stress : Not at all  Social Connections: Moderately Integrated   Frequency of Communication with Friends and Family: More than three times a week   Frequency of Social Gatherings with Friends and Family: More than three times a week   Attends Religious Services: 1 to 4 times per year   Active Member of [employer] or  Organizations: Yes   Attends Archivist Meetings: 1 to 4 times per year   Marital Status: Never married  Intimate Partner Violence: Not At Risk   Fear of Current or Ex-Partner: No   Emotionally Abused: No   Physically Abused: No   Sexually Abused: No    Outpatient Encounter Medications as of 03/04/2021  Medication Sig   albuterol (VENTOLIN HFA) 108 (90 Base) MCG/ACT inhaler Inhale 2 puffs into the lungs every 6 (six) hours as needed for wheezing or shortness of breath.   buPROPion (WELLBUTRIN XL) 150 MG 24 hr tablet Take 1 a day for a week, then 2 a day   HYDROcodone-acetaminophen (NORCO) 10-325 MG tablet hydrocodone 10 mg-acetaminophen 325 mg tablet  Take 1 tablet 3 times a day by oral route as needed for pain.   lisinopril (ZESTRIL) 20 MG tablet Take 1 tablet (20 mg total) by mouth daily.   loperamide (IMODIUM) 2 MG capsule Take by mouth. 2 tablets in the morning and 1 in the evenings   MIRENA, 52 MG, 20 MCG/DAY IUD once.   QUEtiapine (SEROQUEL) 300 MG tablet Take 800 mg by mouth at bedtime.   risperiDONE (RISPERDAL) 1 MG tablet Take 1 tab in the morning and 1 in the afternoon   Suvorexant 20 MG TABS Take 20 mg by mouth at bedtime.   valACYclovir (VALTREX) 500 MG tablet TAKE 1 TABLET (500 MG TOTAL) BY MOUTH DAILY AS NEEDED (FEVER BLISTER).   [DISCONTINUED] gabapentin (NEURONTIN) 300 MG capsule Take 300 mg by mouth 3 (three) times daily.   [DISCONTINUED] promethazine-dextromethorphan (PROMETHAZINE-DM) 6.25-15 MG/5ML syrup Take 5 mLs by mouth at bedtime as needed for cough.   benztropine (COGENTIN) 1 MG tablet Take 1 mg by mouth 2 (two) times daily.   gabapentin (NEURONTIN) 600 MG tablet Take 600 mg by mouth 3 (three) times daily.   promethazine (PHENERGAN) 25 MG tablet Take 25 mg by mouth every 6 (six) hours as needed.   [DISCONTINUED] benzonatate (TESSALON) 100 MG capsule Take 1-2 capsules (  100-200 mg total) by mouth 3 (three) times daily as needed for cough.   [DISCONTINUED]  benztropine (COGENTIN) 2 MG tablet Take 2 mg by mouth 2 (two) times daily.   [DISCONTINUED] hydrOXYzine (ATARAX/VISTARIL) 50 MG tablet Take 50 mg by mouth every 3 (three) hours as needed.   [DISCONTINUED] phentermine (ADIPEX-P) 37.5 MG tablet Take 37.5 mg by mouth every morning.   [DISCONTINUED] predniSONE (DELTASONE) 20 MG tablet Take 2 tablets daily with breakfast. (Patient not taking: Reported on 03/04/2021)   [DISCONTINUED] risperiDONE (RISPERDAL) 2 MG tablet Take 2 mg by mouth in the morning, at noon, and at bedtime.   No facility-administered encounter medications on file as of 03/04/2021.    No Known Allergies  Review of Systems  Constitutional:  Positive for unexpected weight change. Negative for activity change, appetite change, chills, fatigue and fever.  HENT: Negative.    Eyes: Negative.  Negative for photophobia and visual disturbance.  Respiratory:  Negative for cough, chest tightness and shortness of breath.   Cardiovascular:  Negative for chest pain, palpitations and leg swelling.  Gastrointestinal:  Positive for abdominal pain. Negative for blood in stool, constipation, diarrhea, nausea and vomiting.  Endocrine: Negative.   Genitourinary:  Negative for decreased urine volume, difficulty urinating, dysuria, frequency and urgency.  Musculoskeletal:  Positive for arthralgias, back pain and myalgias. Negative for gait problem, joint swelling, neck pain and neck stiffness.  Skin:  Positive for wound (chronic abdominal wound). Negative for color change, pallor and rash.  Allergic/Immunologic: Negative.   Neurological:  Negative for dizziness, tremors, seizures, syncope, facial asymmetry, speech difficulty, weakness, light-headedness, numbness and headaches.  Hematological: Negative.   Psychiatric/Behavioral:  Negative for confusion, hallucinations, sleep disturbance and suicidal ideas.   All other systems reviewed and are negative.      Objective:  BP (!) 154/106    Pulse 89     Temp 97.9 F (36.6 C)    Ht $R'5\' 6"'am$  (1.676 m)    Wt 260 lb (117.9 kg)    SpO2 96%    BMI 41.97 kg/m    Wt Readings from Last 3 Encounters:  03/04/21 260 lb (117.9 kg)  01/03/21 260 lb 12.8 oz (118.3 kg)  12/19/20 254 lb (115.2 kg)    Physical Exam Vitals and nursing note reviewed.  Constitutional:      General: She is not in acute distress.    Appearance: Normal appearance. She is well-developed and well-groomed. She is not ill-appearing, toxic-appearing or diaphoretic.  HENT:     Head: Normocephalic and atraumatic.     Jaw: There is normal jaw occlusion.     Right Ear: Hearing normal.     Left Ear: Hearing normal.     Nose: Nose normal.     Mouth/Throat:     Lips: Pink.     Mouth: Mucous membranes are moist.     Pharynx: Oropharynx is clear. Uvula midline.  Eyes:     General: Lids are normal.     Extraocular Movements: Extraocular movements intact.     Conjunctiva/sclera: Conjunctivae normal.     Pupils: Pupils are equal, round, and reactive to light.  Neck:     Thyroid: No thyroid mass, thyromegaly or thyroid tenderness.     Vascular: No carotid bruit or JVD.     Trachea: Trachea and phonation normal.  Cardiovascular:     Rate and Rhythm: Normal rate and regular rhythm.     Chest Wall: PMI is not displaced.     Pulses: Normal  pulses.     Heart sounds: Normal heart sounds. No murmur heard.   No friction rub. No gallop.  Pulmonary:     Effort: Pulmonary effort is normal. No respiratory distress.     Breath sounds: Normal breath sounds. No wheezing.  Abdominal:     General: Bowel sounds are normal. There is no abdominal bruit.     Palpations: Abdomen is soft. There is no hepatomegaly or splenomegaly.     Comments: Large dressing to mid abdomen, dressing CDI.   Musculoskeletal:     Cervical back: Normal range of motion and neck supple.     Right lower leg: Edema present.     Left lower leg: Edema present.  Lymphadenopathy:     Cervical: No cervical adenopathy.   Skin:    General: Skin is warm and dry.     Capillary Refill: Capillary refill takes less than 2 seconds.     Coloration: Skin is not cyanotic, jaundiced or pale.     Findings: No rash.  Neurological:     General: No focal deficit present.     Mental Status: She is alert and oriented to person, place, and time.     Sensory: Sensation is intact.     Motor: Motor function is intact.     Coordination: Coordination is intact.     Gait: Gait is intact.     Deep Tendon Reflexes: Reflexes are normal and symmetric.  Psychiatric:        Attention and Perception: Attention and perception normal.        Mood and Affect: Mood and affect normal.        Speech: Speech normal.        Behavior: Behavior normal. Behavior is cooperative.        Thought Content: Thought content normal.        Cognition and Memory: Cognition and memory normal.        Judgment: Judgment normal.    Results for orders placed or performed in visit on 10/15/20  Pancreatic elastase, fecal  Result Value Ref Range   Pancreatic Elastase-1, Stool >500 mcg/g  GI Profile, Stool, PCR  Result Value Ref Range   Campylobacter Not Detected Not Detected   C difficile toxin A/B Not Detected Not Detected   Plesiomonas shigelloides Not Detected Not Detected   Salmonella Not Detected Not Detected   Vibrio Not Detected Not Detected   Vibrio cholerae Not Detected Not Detected   Yersinia enterocolitica Not Detected Not Detected   Enteroaggregative E coli Not Detected Not Detected   Enteropathogenic E coli Not Detected Not Detected   Enterotoxigenic E coli Not Detected Not Detected   Shiga-toxin-producing E coli Not Detected Not Detected   E coli B510 Not applicable Not Detected   Shigella/Enteroinvasive E coli Not Detected Not Detected   Cryptosporidium Not Detected Not Detected   Cyclospora cayetanensis Not Detected Not Detected   Entamoeba histolytica Not Detected Not Detected   Giardia lamblia Not Detected Not Detected    Adenovirus F 40/41 Not Detected Not Detected   Astrovirus Not Detected Not Detected   Norovirus GI/GII Not Detected Not Detected   Rotavirus A Not Detected Not Detected   Sapovirus Not Detected Not Detected       Pertinent labs & imaging results that were available during my care of the patient were reviewed by me and considered in my medical decision making.  Assessment & Plan:  Nakya was seen today for new patient (initial visit).  Diagnoses and all orders for this visit:  Morbid obesity with BMI of 40.0-44.9, adult ([place]) Diet and exercise encouraged. History of prior gastric sleeve. If labs unremarkable, will initiate Wegovy.  -     CMP14+EGFR -     Lipid panel -     Thyroid Panel With TSH -     Microalbumin / creatinine urine ratio -     CBC with Differential/Platelet  Chronic pain syndrome Seen by pain management.  -     ToxASSURE Select 13 (MW), Urine  Abdominal wall seroma, sequela With chronic open abdominal wound, managed by Dr. Quentin Cornwall -     CMP14+EGFR -     Lipid panel -     Thyroid Panel With TSH -     Microalbumin / creatinine urine ratio -     CBC with Differential/Platelet  Iron deficiency anemia due to chronic blood loss Will check labs today and initiate therapy if warranted.  -     CBC with Differential/Platelet  History of CVA (cerebrovascular accident) Currently not on plavix or blood pressure medications. Will initiate lisinopril. Pt aware to make follow up with her neurologist to discuss need for continued plavix therapy.  -     CMP14+EGFR -     Lipid panel -     Thyroid Panel With TSH -     lisinopril (ZESTRIL) 20 MG tablet; Take 1 tablet (20 mg total) by mouth daily. -     CBC with Differential/Platelet  Primary hypertension BP not controlled. Changes were made in regimen today, lisinopril added. Goal BP is 130/80. Pt aware to report any persistent high or low readings. DASH diet and exercise encouraged. Exercise at least 150 minutes per week  and increase as tolerated. Goal BMI > 25. Stress management encouraged. Avoid nicotine and tobacco product use. Avoid excessive alcohol and NSAID's. Avoid more than 2000 mg of sodium daily. Medications as prescribed. Follow up as scheduled.  -     CMP14+EGFR -     Lipid panel -     Thyroid Panel With TSH -     Microalbumin / creatinine urine ratio -     lisinopril (ZESTRIL) 20 MG tablet; Take 1 tablet (20 mg total) by mouth daily. -     CBC with Differential/Platelet     Continue all other maintenance medications.  Follow up plan: Return in about 2 weeks (around 03/18/2021), or if symptoms worsen or fail to improve, for HTN.   Continue healthy lifestyle choices, including diet (rich in fruits, vegetables, and lean proteins, and low in salt and simple carbohydrates) and exercise (at least 30 minutes of moderate physical activity daily).  Educational handout given for weight loss, DASH diet  The above assessment and management plan was discussed with the patient. The patient verbalized understanding of and has agreed to the management plan. Patient is aware to call the clinic if they develop any new symptoms or if symptoms persist or worsen. Patient is aware when to return to the clinic for a follow-up visit. Patient educated on when it is appropriate to go to the emergency department.   Monia Pouch, FNP-C [facility] [phone]

## 2021-03-04 NOTE — Patient Instructions (Signed)

## 2021-03-04 NOTE — Telephone Encounter (Signed)
Attempted to call pt but unable to reach. Left message for her to return call. 

## 2021-03-05 ENCOUNTER — Telehealth: Payer: Self-pay | Admitting: Family Medicine

## 2021-03-05 LAB — CMP14+EGFR
ALT: 11 IU/L (ref 0–32)
AST: 16 IU/L (ref 0–40)
Albumin/Globulin Ratio: 1.6 (ref 1.2–2.2)
Albumin: 4.2 g/dL (ref 3.8–4.8)
Alkaline Phosphatase: 102 IU/L (ref 44–121)
BUN/Creatinine Ratio: 13 (ref 9–23)
BUN: 9 mg/dL (ref 6–24)
Bilirubin Total: <0.2 mg/dL (ref 0.0–1.2)
CO2: 25 mmol/L (ref 20–29)
Calcium: 8.7 mg/dL (ref 8.7–10.2)
Chloride: 99 mmol/L (ref 96–106)
Creatinine, Ser: 0.68 mg/dL (ref 0.57–1.00)
Globulin, Total: 2.7 g/dL (ref 1.5–4.5)
Glucose: 74 mg/dL (ref 70–99)
Potassium: 3.8 mmol/L (ref 3.5–5.2)
Sodium: 139 mmol/L (ref 134–144)
Total Protein: 6.9 g/dL (ref 6.0–8.5)
eGFR: 110 mL/min/{1.73_m2} (ref >59)

## 2021-03-05 LAB — LIPID PANEL
Chol/HDL Ratio: 3.1 ratio (ref 0.0–4.4)
Cholesterol, Total: 196 mg/dL (ref 100–199)
HDL: 64 mg/dL (ref >39)
LDL Chol Calc (NIH): 112 mg/dL — ABNORMAL HIGH (ref 0–99)
Triglycerides: 115 mg/dL (ref 0–149)
VLDL Cholesterol Cal: 20 mg/dL (ref 5–40)

## 2021-03-05 LAB — THYROID PANEL WITH TSH
Free Thyroxine Index: 2 (ref 1.2–4.9)
T3 Uptake Ratio: 31 % (ref 24–39)
T4, Total: 6.6 ug/dL (ref 4.5–12.0)
TSH: 1.54 u[IU]/mL (ref 0.450–4.500)

## 2021-03-05 LAB — MICROALBUMIN / CREATININE URINE RATIO
Creatinine, Urine: 100.6 mg/dL
Microalb/Creat Ratio: 13 mg/g creat (ref 0–29)
Microalbumin, Urine: 12.7 ug/mL

## 2021-03-05 MED ORDER — PROMETHAZINE-DM 6.25-15 MG/5ML PO SOLN: 5.0000 mL | Freq: Four times a day (QID) | ORAL | 0 refills | Status: DC | PRN

## 2021-03-05 NOTE — Telephone Encounter (Signed)
ATC pt with Dr Agustina Caroli  rec, patient did not answer will try again later

## 2021-03-05 NOTE — Telephone Encounter (Signed)
Let her know that I refilled, can use temporarily but it is not a med I would recommend for long term.

## 2021-03-05 NOTE — Telephone Encounter (Signed)
Called and spoke with patient. She stated that she was seen at the [facility] in [place] last week for her wheezing and coughing. She was prescribed promethazine-dextromethorphan (PROMETHAZINE-DM) 6.25-15 MG/5ML syrup .   She stated that the promethazine has helped with her cough and she would like a refill. She called the UC for a refill but they declined it and told her to call our office. I advised her that I would have to send a message over to Dr. Lamonte Sakai. She verbalized understanding.   She also mentioned on the phone that she does not want Tessalon perles as her insurance does not cover them and they do not work for her.   Pharmacy is [facility] in [place].   RB, can you please advise?

## 2021-03-06 DIAGNOSIS — R635 Abnormal weight gain: Secondary | ICD-10-CM | POA: Diagnosis not present

## 2021-03-06 NOTE — Telephone Encounter (Signed)
I called and spoke with the pt and notified of response per Dr Byrum  She verbalized understanding  Nothing further needed 

## 2021-03-06 NOTE — Telephone Encounter (Signed)
Patient aware.

## 2021-03-08 LAB — TOXASSURE SELECT 13 (MW), URINE

## 2021-03-12 ENCOUNTER — Telehealth: Payer: Self-pay | Admitting: Family Medicine

## 2021-03-12 DIAGNOSIS — Z8719 Personal history of other diseases of the digestive system: Secondary | ICD-10-CM | POA: Diagnosis not present

## 2021-03-12 DIAGNOSIS — Z79899 Other long term (current) drug therapy: Secondary | ICD-10-CM | POA: Diagnosis not present

## 2021-03-12 DIAGNOSIS — G4733 Obstructive sleep apnea (adult) (pediatric): Secondary | ICD-10-CM | POA: Diagnosis not present

## 2021-03-12 DIAGNOSIS — J849 Interstitial pulmonary disease, unspecified: Secondary | ICD-10-CM | POA: Diagnosis not present

## 2021-03-12 DIAGNOSIS — F17290 Nicotine dependence, other tobacco product, uncomplicated: Secondary | ICD-10-CM | POA: Diagnosis not present

## 2021-03-12 DIAGNOSIS — K219 Gastro-esophageal reflux disease without esophagitis: Secondary | ICD-10-CM | POA: Diagnosis not present

## 2021-03-12 DIAGNOSIS — Z9884 Bariatric surgery status: Secondary | ICD-10-CM | POA: Diagnosis not present

## 2021-03-12 DIAGNOSIS — Z8673 Personal history of transient ischemic attack (TIA), and cerebral infarction without residual deficits: Secondary | ICD-10-CM | POA: Diagnosis not present

## 2021-03-12 DIAGNOSIS — F250 Schizoaffective disorder, bipolar type: Secondary | ICD-10-CM | POA: Diagnosis not present

## 2021-03-12 DIAGNOSIS — E669 Obesity, unspecified: Secondary | ICD-10-CM | POA: Diagnosis not present

## 2021-03-12 DIAGNOSIS — I10 Essential (primary) hypertension: Secondary | ICD-10-CM | POA: Diagnosis not present

## 2021-03-12 DIAGNOSIS — Z8616 Personal history of COVID-19: Secondary | ICD-10-CM | POA: Diagnosis not present

## 2021-03-12 DIAGNOSIS — F902 Attention-deficit hyperactivity disorder, combined type: Secondary | ICD-10-CM | POA: Diagnosis not present

## 2021-03-12 DIAGNOSIS — D860 Sarcoidosis of lung: Secondary | ICD-10-CM | POA: Diagnosis not present

## 2021-03-13 NOTE — Telephone Encounter (Signed)
Pt aware that there would be no repeat drug screen and no controlled substances prescribed by [place]. Pt hung up the phone on me

## 2021-03-14 ENCOUNTER — Encounter: Payer: Self-pay | Admitting: Family Medicine

## 2021-03-14 ENCOUNTER — Ambulatory Visit: Payer: Medicare Other | Admitting: Family Medicine

## 2021-03-15 ENCOUNTER — Ambulatory Visit: Payer: Self-pay

## 2021-03-20 ENCOUNTER — Ambulatory Visit: Payer: Medicare Other | Admitting: Family Medicine

## 2021-03-20 DIAGNOSIS — Z79899 Other long term (current) drug therapy: Secondary | ICD-10-CM | POA: Diagnosis not present

## 2021-03-20 DIAGNOSIS — M479 Spondylosis, unspecified: Secondary | ICD-10-CM | POA: Diagnosis not present

## 2021-03-24 ENCOUNTER — Ambulatory Visit: Payer: Medicare Other | Admitting: *Deleted

## 2021-03-24 DIAGNOSIS — Z79899 Other long term (current) drug therapy: Secondary | ICD-10-CM | POA: Diagnosis not present

## 2021-03-24 DIAGNOSIS — D869 Sarcoidosis, unspecified: Secondary | ICD-10-CM

## 2021-03-24 DIAGNOSIS — F3162 Bipolar disorder, current episode mixed, moderate: Secondary | ICD-10-CM

## 2021-03-24 NOTE — Patient Instructions (Signed)
Visit Information  Thank you for taking time to visit with me today. Please don't hesitate to contact me if I can be of assistance to you before our next scheduled telephone appointment.  Following are the goals we discussed today:  Take medications as prescribed   Attend all scheduled provider appointments Perform all self care activities independently  Perform IADL's (shopping, preparing meals, housekeeping, managing finances) independently Call provider office for new concerns or questions  Try to cut down or quit vaping Continue to see psychiatrist Dr. Erling Cruz Continue ECT therapy at [facility] adequate protein in diet to help with wound healing Drink adequate fluids Avoid spicy, greasy foods Get outdoors daily if possible, try walking for exercise  Our next appointment is - no further scheduled appointment, pt no longer eligible for CCM services  Please call the care guide team at [phone] if you need to cancel or reschedule your appointment.   If you are experiencing a Mental Health or [place] or need someone to talk to, please call the [place] National Suicide Prevention Lifeline: (903)301-9619 or TTY: 239-186-5312 TTY (360)085-0197) to talk to a trained counselor call 1-800-273-TALK (toll free, 24 hour hotline) go to [facility] [address] [place] [phone]) call the [place]: [phone] call 911   The patient verbalized understanding of instructions, educational materials, and care plan provided today and declined offer to receive copy of patient instructions, educational materials, and care plan.   Jacqlyn Larsen RNC, BSN RN Case Manager [facility] [phone]

## 2021-03-24 NOTE — Chronic Care Management (AMB) (Signed)
Care Management    RN Visit Note  03/24/2021 Name: [name] MRN: [id number] DOB: 24-Mar-1975  Subjective: [name] is a 46 y.o. year old female who is a primary care patient of [name]., MD. The care management team was consulted for assistance with disease management and care coordination needs.    Engaged with patient by telephone for follow up visit in response to provider referral for case management and/or care coordination services.   Consent to Services:   [name] was given information about Care Management services today including:  Care Management services includes personalized support from designated clinical staff supervised by her physician, including individualized plan of care and coordination with other care providers 24/7 contact phone numbers for assistance for urgent and routine care needs. The patient may stop case management services at any time by phone call to the office staff.  Patient agreed to services and consent obtained.   Assessment: Review of patient past medical history, allergies, medications, health status, including review of consultants reports, laboratory and other test data, was performed as part of comprehensive evaluation and provision of chronic care management services.   SDOH (Social Determinants of Health) assessments and interventions performed:    Care Plan  No Known Allergies  Outpatient Encounter Medications as of 03/24/2021  Medication Sig   albuterol (VENTOLIN HFA) 108 (90 Base) MCG/ACT inhaler Inhale 2 puffs into the lungs every 6 (six) hours as needed for wheezing or shortness of breath.   benztropine (COGENTIN) 1 MG tablet Take 1 mg by mouth 2 (two) times daily.   buPROPion (WELLBUTRIN XL) 150 MG 24 hr tablet Take 1 a day for a week, then 2 a day   gabapentin (NEURONTIN) 600 MG tablet Take 600 mg by mouth 3 (three) times daily.   HYDROcodone-acetaminophen (NORCO) 10-325 MG tablet hydrocodone 10  mg-acetaminophen 325 mg tablet  Take 1 tablet 3 times a day by oral route as needed for pain.   lisinopril (ZESTRIL) 20 MG tablet Take 1 tablet (20 mg total) by mouth daily.   loperamide (IMODIUM) 2 MG capsule Take by mouth. 2 tablets in the morning and 1 in the evenings   MIRENA, 52 MG, 20 MCG/DAY IUD once.   promethazine (PHENERGAN) 25 MG tablet Take 25 mg by mouth every 6 (six) hours as needed.   Promethazine-DM 6.25-15 MG/5ML SOLN Take 5 mLs by mouth every 6 (six) hours as needed (Cough).   QUEtiapine (SEROQUEL) 300 MG tablet Take 800 mg by mouth at bedtime.   risperiDONE (RISPERDAL) 1 MG tablet Take 1 tab in the morning and 1 in the afternoon   Suvorexant 20 MG TABS Take 20 mg by mouth at bedtime.   valACYclovir (VALTREX) 500 MG tablet TAKE 1 TABLET (500 MG TOTAL) BY MOUTH DAILY AS NEEDED (FEVER BLISTER).   No facility-administered encounter medications on file as of 03/24/2021.    Patient Active Problem List   Diagnosis Date Noted   Chronic pain syndrome 03/04/2021   Hypertension 03/04/2021   Lumbar radiculopathy 01/20/2021   OSA (obstructive sleep apnea) 10/04/2020   PONV (postoperative nausea and vomiting) 10/04/2020   Hx of hiatal hernia 09/13/2020   Sedative, hypnotic or anxiolytic use disorder, severe, in controlled environment ([place]) 09/11/2020   Lumbar spondylosis 08/29/2020   Pain 03/08/2020   Iron deficiency anemia due to chronic blood loss 11/09/2019   Sarcoidosis 04/26/2019   Abnormal CT of the chest 03/23/2019   Hypokalemia 02/24/2019   History of  CVA (cerebrovascular accident) 11/16/2018   CVA (cerebrovascular accident) [facility])    Degeneration of lumbar intervertebral disc 12/13/2014   Anemia associated with nutritional deficiency 11/12/2014   Bipolar affective disorder ([place]) 09/28/2014   S/P laparoscopic sleeve gastrectomy 01/2014 09/28/2014   Abdominal wall seroma - chronic    Morbid obesity with BMI of 40.0-44.9, adult ([place]) 05/11/2013   Cholelithiases  04/25/2013   Smoker 09/19/2011   GERD 03/04/2010    Conditions to be addressed/monitored: Bipolar Disorder and Sarcoidosis  Care Plan : RN Care Manager plan of care  Updates made by Melissa Mends, RN since 03/24/2021 12:00 AM  Completed 03/24/2021   Problem: No plan of care established for management of chronic disease states (Sarcoidosis, GERD, Bipolar) Resolved 03/24/2021  Priority: High     Long-Range Goal: Development of plan of care for chronic disease management (Sarcoidosis, GERD, Bipolar) Completed 03/24/2021  Start Date: 01/20/2021  Expected End Date: 07/19/2021  Priority: High  Note:   Care plan completed Current Barriers:  Knowledge Deficits related to plan of care for management of Sarcoidosis, GERD, Bipolar  Ineffective Self Health Maintenance in a patient with Sarcoidosis Pulmonary Disease/ GERD/ Bipolar. Patient reports she lives alone and splits her time between her apartment and her mother's house, pt reports she is independent with all aspects of her care, continues to drive, now lives in apartment in [place].  Pt states she frequently sees [name] psychiatrist at [facility] in [place], also receives ECT therapy in [place] for her bipolar disorder, pt denies depression today stating the medication she is taking has helped tremendously but causes her to have increased appetite.  Pt wants to see a psychologist/ counselor but wants this to be in [place].  Pt reports she now has a list of psychologists but has not pursued checking on but plans to. Pt reports has wound vac has been discontinued and pt packs abdominal wound daily with RN visiting from [place] seeing her once per week,  pt has abdominal wound related to years ago colon rupture and hernia repair which led to other issues. Patient reports she is no longer seeing Dr. Jenna [name] and is now seeing Dr. Kellie [name] with [facility] (does not participate with embedded CCM  services) Does not adhere to provider recommendations re: patient continues to vape nicotine and unable to quit at present Does not contact provider office for questions/concerns  RNCM Clinical Goal(s):  Patient will verbalize understanding of plan for management of Bipolar Disorder and GERD, Sarcoidosis  as evidenced by patient report, review EHR and  through collaboration with RN Care manager, provider, and care team.   Interventions: 1:1 collaboration with primary care provider regarding development and update of comprehensive plan of care as evidenced by provider attestation and co-signature Inter-disciplinary care team collaboration (see longitudinal plan of care) Evaluation of current treatment plan related to  self management and patient's adherence to plan as established by provider   Sarcoidosis, Bipolar, GERD Health Maintenance Interventions:  (Status:  Goal on track:  Yes.) Long Term Goal Provided education about importance of contacting psychologist from the list patient has   Reinforced with  patient to get outside daily and do some type of exercise such as walking Reviewed importance of good handwashing and wearing a mask as needed Reviewed importance of taking medications as prescribed Encouraged patient to decrease/ stop vaping Reinforced importance of proper nutrition including adequate protein for wound healing Reviewed importance of avoiding spicy,  greasy foods  Patient Goals/Self-Care Activities: Take medications as prescribed   Attend all scheduled provider appointments Perform all self care activities independently  Perform IADL's (shopping, preparing meals, housekeeping, managing finances) independently Call provider office for new concerns or questions  Try to cut down or quit vaping Continue to see psychiatrist Dr. Erling Cruz Continue E[facility] therapy at [facility] adequate protein in diet to help with wound healing Drink adequate fluids Avoid spicy, greasy foods Get  outdoors daily if possible, try walking for exercise  Follow Up Plan:  Case closed today as pt has new primary care provider Dr. Kellie [name] at [facility] and is currently not participant with embedded chronic care management      Plan: Telephone follow up appointment with care management team member scheduled for:  case closed today  Jacqlyn Larsen [facility], BSN RN Case Manager [facility] [phone]

## 2021-04-01 ENCOUNTER — Encounter: Payer: Self-pay | Admitting: Hematology and Oncology

## 2021-04-02 ENCOUNTER — Ambulatory Visit
Admission: RE | Admit: 2021-04-02 | Discharge: 2021-04-02 | Disposition: A | Discharge status: Home / Self Care | Payer: 59 | Source: Ambulatory Visit | Attending: Family Medicine | Admitting: Family Medicine

## 2021-04-02 ENCOUNTER — Other Ambulatory Visit: Payer: Self-pay

## 2021-04-02 VITALS — BP 133/85 | HR 110 | Temp 98.1°F | Resp 18

## 2021-04-02 DIAGNOSIS — S31109A Unspecified open wound of abdominal wall, unspecified quadrant without penetration into peritoneal cavity, initial encounter: Secondary | ICD-10-CM | POA: Diagnosis not present

## 2021-04-02 DIAGNOSIS — R109 Unspecified abdominal pain: Secondary | ICD-10-CM

## 2021-04-02 DIAGNOSIS — R5383 Other fatigue: Secondary | ICD-10-CM | POA: Diagnosis not present

## 2021-04-02 LAB — POCT URINALYSIS DIP (MANUAL ENTRY)
Bilirubin, UA: NEGATIVE
Blood, UA: NEGATIVE
Glucose, UA: NEGATIVE mg/dL
Ketones, POC UA: NEGATIVE mg/dL
Leukocytes, UA: NEGATIVE
Nitrite, UA: NEGATIVE
Protein Ur, POC: NEGATIVE mg/dL
Spec Grav, UA: 1.015 (ref 1.010–1.025)
Urobilinogen, UA: 0.2 E.U./dL
pH, UA: 6.5 (ref 5.0–8.0)

## 2021-04-02 MED ORDER — DOXYCYCLINE HYCLATE 100 MG PO CAPS: 100.0000 mg | ORAL_CAPSULE | Freq: Two times a day (BID) | ORAL | 0 refills | Status: DC

## 2021-04-02 NOTE — ED Triage Notes (Signed)
Pain on urination for over 1 week and lower back pain

## 2021-04-02 NOTE — Discharge Instructions (Signed)
You have had labs (blood work) drawn today. We will call you with any significant abnormalities or if there is need to begin or change treatment or pursue further follow up.  You may also review your test results online through MyChart. If you do not have a MyChart account, instructions to sign up should be on your discharge paperwork.  

## 2021-04-04 LAB — CBC WITH DIFFERENTIAL/PLATELET
Basophils Absolute: 0 10*3/uL (ref 0.0–0.2)
Basos: 0 %
EOS (ABSOLUTE): 0.3 10*3/uL (ref 0.0–0.4)
Eos: 4 %
Hematocrit: 38.6 % (ref 34.0–46.6)
Hemoglobin: 12.4 g/dL (ref 11.1–15.9)
Immature Grans (Abs): 0 10*3/uL (ref 0.0–0.1)
Immature Granulocytes: 0 %
Lymphocytes Absolute: 0.9 10*3/uL (ref 0.7–3.1)
Lymphs: 11 %
MCH: 25.9 pg — ABNORMAL LOW (ref 26.6–33.0)
MCHC: 32.1 g/dL (ref 31.5–35.7)
MCV: 81 fL (ref 79–97)
Monocytes Absolute: 0.7 10*3/uL (ref 0.1–0.9)
Monocytes: 9 %
Neutrophils Absolute: 6.3 10*3/uL (ref 1.4–7.0)
Neutrophils: 76 %
Platelets: 350 10*3/uL (ref 150–450)
RBC: 4.78 x10E6/uL (ref 3.77–5.28)
RDW: 14.7 % (ref 11.7–15.4)
WBC: 8.2 10*3/uL (ref 3.4–10.8)

## 2021-04-04 LAB — COMPREHENSIVE METABOLIC PANEL
ALT: 16 IU/L (ref 0–32)
AST: 16 IU/L (ref 0–40)
Albumin/Globulin Ratio: 1.5 (ref 1.2–2.2)
Albumin: 4 g/dL (ref 3.8–4.8)
Alkaline Phosphatase: 113 IU/L (ref 44–121)
BUN/Creatinine Ratio: 14 (ref 9–23)
BUN: 9 mg/dL (ref 6–24)
Bilirubin Total: <0.2 mg/dL (ref 0.0–1.2)
CO2: 21 mmol/L (ref 20–29)
Calcium: 9.1 mg/dL (ref 8.7–10.2)
Chloride: 101 mmol/L (ref 96–106)
Creatinine, Ser: 0.65 mg/dL (ref 0.57–1.00)
Globulin, Total: 2.7 g/dL (ref 1.5–4.5)
Glucose: 80 mg/dL (ref 70–99)
Potassium: 4 mmol/L (ref 3.5–5.2)
Sodium: 139 mmol/L (ref 134–144)
Total Protein: 6.7 g/dL (ref 6.0–8.5)
eGFR: 111 mL/min/{1.73_m2} (ref >59)

## 2021-04-04 LAB — TSH: TSH: 0.671 u[IU]/mL (ref 0.450–4.500)

## 2021-04-07 NOTE — ED Provider Notes (Signed)
[place]   [id number] 04/02/21 Arrival Time: 8299  ASSESSMENT & PLAN:  1. Open wound of abdominal wall, initial encounter   2. Other fatigue   3. Abdominal discomfort    Results for orders placed or performed during the hospital encounter of 04/02/21  CBC with Differential/Platelet  Result Value Ref Range   WBC 8.2 3.4 - 10.8 x10E3/uL   RBC 4.78 3.77 - 5.28 x10E6/uL   Hemoglobin 12.4 11.1 - 15.9 g/dL   Hematocrit 38.6 34.0 - 46.6 %   MCV 81 79 - 97 fL   MCH 25.9 (L) 26.6 - 33.0 pg   MCHC 32.1 31.5 - 35.7 g/dL   RDW 14.7 11.7 - 15.4 %   Platelets 350 150 - 450 x10E3/uL   Neutrophils 76 Not Estab. %   Lymphs 11 Not Estab. %   Monocytes 9 Not Estab. %   Eos 4 Not Estab. %   Basos 0 Not Estab. %   Neutrophils Absolute 6.3 1.4 - 7.0 x10E3/uL   Lymphocytes Absolute 0.9 0.7 - 3.1 x10E3/uL   Monocytes Absolute 0.7 0.1 - 0.9 x10E3/uL   EOS (ABSOLUTE) 0.3 0.0 - 0.4 x10E3/uL   Basophils Absolute 0.0 0.0 - 0.2 x10E3/uL   Immature Granulocytes 0 Not Estab. %   Immature Grans (Abs) 0.0 0.0 - 0.1 x10E3/uL  Comprehensive metabolic panel  Result Value Ref Range   Glucose 80 70 - 99 mg/dL   BUN 9 6 - 24 mg/dL   Creatinine, Ser 0.65 0.57 - 1.00 mg/dL   eGFR 111 >59 mL/min/1.73   BUN/Creatinine Ratio 14 9 - 23   Sodium 139 134 - 144 mmol/L   Potassium 4.0 3.5 - 5.2 mmol/L   Chloride 101 96 - 106 mmol/L   CO2 21 20 - 29 mmol/L   Calcium 9.1 8.7 - 10.2 mg/dL   Total Protein 6.7 6.0 - 8.5 g/dL   Albumin 4.0 3.8 - 4.8 g/dL   Globulin, Total 2.7 1.5 - 4.5 g/dL   Albumin/Globulin Ratio 1.5 1.2 - 2.2   Bilirubin Total <0.2 0.0 - 1.2 mg/dL   Alkaline Phosphatase 113 44 - 121 IU/L   AST 16 0 - 40 IU/L   ALT 16 0 - 32 IU/L  TSH  Result Value Ref Range   TSH 0.671 0.450 - 4.500 uIU/mL  POCT urinalysis dipstick  Result Value Ref Range   Color, UA yellow yellow   Clarity, UA clear clear   Glucose, UA negative negative mg/dL   Bilirubin, UA negative negative   Ketones, POC  UA negative negative mg/dL   Spec Grav, UA 1.015 1.010 - 1.025   Blood, UA negative negative   pH, UA 6.5 5.0 - 8.0   Protein Ur, POC negative negative mg/dL   Urobilinogen, UA 0.2 0.2 or 1.0 E.U./dL   Nitrite, UA Negative Negative   Leukocytes, UA Negative Negative   No significant lab abnormalities. Will start antibiotic to cover for early chronic wound infection. Begin: Meds ordered this encounter  Medications   doxycycline (VIBRAMYCIN) 100 MG capsule    Sig: Take 1 capsule (100 mg total) by mouth 2 (two) times daily.    Dispense:  20 capsule    Refill:  0   Recommend:  Follow-up Information     Sherald Hess., MD.   Specialty: Family Medicine Why: If worsening or failing to improve as anticipated. Contact information: [place] [address] [phone]  Reviewed expectations re: course of current medical issues. Questions answered. Outlined signs and symptoms indicating need for more acute intervention. Patient verbalized understanding. After Visit Summary given.   SUBJECTIVE: History from: patient.  [name] is a 46 y.o. female who presents with complaint fatigue and mild to moderate abdominal discomfort; has chronic non-healing wound of abd wall; multiple surgeries. Reports one weeks of feeling very fatigued and noting increased serous drainage from abd wound; packs daily. Afebrile. Questions some mild dysuria. Would like to r/o UTI. Appetite: normal. PO intake: normal. Ambulatory without assistance..  No LMP recorded. (Menstrual status: IUD).  Past Surgical History:  Procedure Laterality Date   Abdominal wall hernia  07/2007   open repair with lysis of adhesions   APPLICATION OF WOUND VAC N/A 06/29/2019   Procedure: PLACEMENT OF WOUND VAC;  Surgeon: Greer Pickerel, MD;  Location: [facility];  Service: General;  Laterality: N/A;   BRONCHIAL BRUSHINGS  04/11/2019   Procedure: BRONCHIAL BRUSHINGS;  Surgeon: Collene Gobble, MD;  Location: [facility] ENDOSCOPY;  Service: Pulmonary;;   BRONCHIAL WASHINGS  04/11/2019   Procedure: BRONCHIAL WASHINGS;  Surgeon: Collene Gobble, MD;  Location[facility] ENDOSCOPY;  Service: Pulmonary;;   COLONOSCOPY  03/13/2010   [id number] hemorrhoids likely cause of hematochezia, otherwise normal   COLONOSCOPY  05/24/2019   COLONOSCOPY WITH ESOPHAGOGASTRODUODENOSCOPY (EGD)  04/2019   Colostomy reversal  11/2006   DEBRIDEMENT OF ABDOMINAL WALL ABSCESS N/A 06/29/2019   Procedure: EXCISIONAL DEBRIDEMENT OF ABDOMINAL WALL/SUBCU SEROMA;  Surgeon: Greer Pickerel, MD;  Location: [facility];  Service: General;  Laterality: N/A;   ESOPHAGOGASTRODUODENOSCOPY  03/13/2010   [id number] esophagus, status post passage of a [place] dilator/Small hiatal hernia/ Antral erosions, status post biopsy   EXAMINATION UNDER ANESTHESIA N/A 05/11/2012   Procedure: EXAM UNDER ANESTHESIA;  Surgeon: Donato Heinz, MD;  Location: [facility];  Service: General;  Laterality: N/A;   Exploratory laparotomy with resection  2008   colonoscopy   FINE NEEDLE ASPIRATION  04/11/2019   Procedure: FINE NEEDLE ASPIRATION (FNA) LINEAR;  Surgeon: Collene Gobble, MD;  Location: [facility]OSCOPY;  Service: Pulmonary;;   FINGER CLOSED REDUCTION Right 08/05/2012   Procedure: CLOSED REDUCTION RIGHT THUMB (FINGER);  Surgeon: Linna Hoff, MD;  Location: [facility];  Service: Orthopedics;  Laterality: Right;   HERNIA REPAIR     incisional hernia surgery    Hx of abd wall seroma  08/2007   Drained via [place] in [place], [place]   Hx of abd wall seroma  10/2007   Drained by Dr. Geroge Baseman in office   [place] N/A 06/29/2019   Procedure: INCISION AND DRAINAGE;  Surgeon: Greer Pickerel, MD;  Location: [facility];  Service: General;  Laterality: N/A;   IR RADIOLOGIST EVAL & MGMT  03/15/2019   IR RADIOLOGIST EVAL & MGMT  04/06/2019   Kidney stones     LAPAROSCOPIC GASTRIC SLEEVE RESECTION N/A 02/13/2014   Procedure: LAPAROSCOPIC GASTRIC SLEEVE  RESECTION LYSIS OF ADHESIONS, UPPER ENDOSCOPY;  Surgeon: [name], MD;  Location: [facility];  Service: General;  Laterality: N/A;   LAPAROSCOPY N/A 09/28/2014   Procedure: LAPAROSCOPY DIAGNOSTIC, INCISION AND DRAINAGE WITH LAPAROSCOPIC EXPLORATION OF ABDOMINAL WALL SEROMA with ultrasound;  Surgeon: Greer Pickerel, MD;  Location: [facility];  Service: General;  Laterality: N/A;   LUNG BIOPSY  04/11/2019   Procedure: LUNG BIOPSY;  Surgeon: Collene Gobble, MD;  Location: [facility] ENDOSCOPY;  Service: Pulmonary;;  distal trachea   PLACEMENT OF SETON  N/A 05/11/2012   Procedure: PLACEMENT OF SETON;  Surgeon: Donato Heinz, MD;  Location: [facility];  Service: General;  Laterality: N/A;   root canal 7-16     TONSILLECTOMY     TREATMENT FISTULA ANAL     x 2   UPPER GASTROINTESTINAL ENDOSCOPY  05/24/2019   VIDEO BRONCHOSCOPY WITH ENDOBRONCHIAL ULTRASOUND N/A 04/11/2019   Procedure: VIDEO BRONCHOSCOPY WITH ENDOBRONCHIAL ULTRASOUND;  Surgeon: Collene Gobble, MD;  Location: [facility] ENDOSCOPY;  Service: Pulmonary;  Laterality: N/A;    OBJECTIVE:  Vitals:   04/02/21 0955  BP: 133/85  Pulse: (!) 110  Resp: 18  Temp: 98.1 F (36.7 C)  TempSrc: Oral  SpO2: 96%    General appearance: alert; no distress Lungs: unlabored Heart: regular Abdomen: soft; non-distended; chronic abd wound with serous drainage; no bleeding Extremities: no edema; symmetrical with no gross deformities Skin: warm; dry Neurologic: normal gait Psychological: alert and cooperative; normal mood and affect  Labs: Results for orders placed or performed during the hospital encounter of 04/02/21  CBC with Differential/Platelet  Result Value Ref Range   WBC 8.2 3.4 - 10.8 x10E3/uL   RBC 4.78 3.77 - 5.28 x10E6/uL   Hemoglobin 12.4 11.1 - 15.9 g/dL   Hematocrit 38.6 34.0 - 46.6 %   MCV 81 79 - 97 fL   MCH 25.9 (L) 26.6 - 33.0 pg   MCHC 32.1 31.5 - 35.7 g/dL   RDW 14.7 11.7 - 15.4 %   Platelets 350 150 - 450 x10E3/uL   Neutrophils 76 Not Estab.  %   Lymphs 11 Not Estab. %   Monocytes 9 Not Estab. %   Eos 4 Not Estab. %   Basos 0 Not Estab. %   Neutrophils Absolute 6.3 1.4 - 7.0 x10E3/uL   Lymphocytes Absolute 0.9 0.7 - 3.1 x10E3/uL   Monocytes Absolute 0.7 0.1 - 0.9 x10E3/uL   EOS (ABSOLUTE) 0.3 0.0 - 0.4 x10E3/uL   Basophils Absolute 0.0 0.0 - 0.2 x10E3/uL   Immature Granulocytes 0 Not Estab. %   Immature Grans (Abs) 0.0 0.0 - 0.1 x10E3/uL  Comprehensive metabolic panel  Result Value Ref Range   Glucose 80 70 - 99 mg/dL   BUN 9 6 - 24 mg/dL   Creatinine, Ser 0.65 0.57 - 1.00 mg/dL   eGFR 111 >59 mL/min/1.73   BUN/Creatinine Ratio 14 9 - 23   Sodium 139 134 - 144 mmol/L   Potassium 4.0 3.5 - 5.2 mmol/L   Chloride 101 96 - 106 mmol/L   CO2 21 20 - 29 mmol/L   Calcium 9.1 8.7 - 10.2 mg/dL   Total Protein 6.7 6.0 - 8.5 g/dL   Albumin 4.0 3.8 - 4.8 g/dL   Globulin, Total 2.7 1.5 - 4.5 g/dL   Albumin/Globulin Ratio 1.5 1.2 - 2.2   Bilirubin Total <0.2 0.0 - 1.2 mg/dL   Alkaline Phosphatase 113 44 - 121 IU/L   AST 16 0 - 40 IU/L   ALT 16 0 - 32 IU/L  TSH  Result Value Ref Range   TSH 0.671 0.450 - 4.500 uIU/mL  POCT urinalysis dipstick  Result Value Ref Range   Color, UA yellow yellow   Clarity, UA clear clear   Glucose, UA negative negative mg/dL   Bilirubin, UA negative negative   Ketones, POC UA negative negative mg/dL   Spec Grav, UA 1.015 1.010 - 1.025   Blood, UA negative negative   pH, UA 6.5 5.0 - 8.0   Protein Ur, POC  negative negative mg/dL   Urobilinogen, UA 0.2 0.2 or 1.0 E.U./dL   Nitrite, UA Negative Negative   Leukocytes, UA Negative Negative   Labs Reviewed  CBC WITH DIFFERENTIAL/PLATELET - Abnormal; Notable for the following components:      Result Value   MCH 25.9 (*)    All other components within normal limits   Narrative:    Performed at:  [facility] [address], [place], [place]  [id number] Lab Director: Rush Farmer MD, Phone:  [phone]  COMPREHENSIVE METABOLIC  PANEL   Narrative:    Performed at:  [address] [place], [place]  [id number] Lab Director: Rush Farmer MD, Phone:  [phone]  TSH   Narrative:    Performed at:  [facility] [address] [place], [place]  [id number] Lab Director: Rush Farmer MD, Phone:  [phone]  LIPASE, BLOOD  POCT URINALYSIS DIP (MANUAL ENTRY)    Imaging: No results found.  No Known Allergies                                             Past Medical History:  Diagnosis Date   ADHD    Anal fistula    Anxiety    Arthritis    pt denies 5/3 visit    Bipolar affective ([place])    Blood transfusion without reported diagnosis    Colostomy in place [facility])    CVA (cerebrovascular accident) ([place])    left cerebellar infarct found accidentally on CT (2020)    Depression    Diverticulitis 2008   perforated/ requiring resection   Fatty liver    Gallstones    GERD (gastroesophageal reflux disease)    occ.   Headache    after a fall   Herpes simplex    History of colon polyps    History of hiatal hernia    small   History of kidney stones    cystoscopy  basket removal   Hypertension    Iron deficiency anemia    Obese    OCD (obsessive compulsive disorder)    Panic attacks    PONV (postoperative nausea and vomiting) 10/04/2020   Pulmonary nodules    Sarcoidosis    Schizophrenia ([place])    Shortness of breath dyspnea    pt denies on 5/3 visit    Sleep apnea    mild, does not use c-pap machine   Substance abuse ([place])    12 years ago-crack cocaine   Social History   Socioeconomic History   Marital status: Single    Spouse name: Not on file   Number of children: 2   Years of education: 12    Highest education level: Not on file  Occupational History   Occupation: Insurance claims handler: UNEMPLOYED  Tobacco Use   Smoking status: Former    Packs/day: 0.50    Years: 14.00    Pack years: 7.00    Types: E-cigarettes, Cigarettes    Quit date:  12/20/2013    Years since quitting: 7.3   Smokeless tobacco: Never   Tobacco comments:    Vapor cigarettes.  Vaping Use   Vaping Use: Every day   Last attempt to quit: 02/24/2019   Substances: Nicotine, Flavoring   Devices: started 2015  Substance and Sexual Activity   Alcohol use: No  Alcohol/week: 0.0 standard drinks   Drug use: Never   Sexual activity: Not Currently    Birth control/protection: Abstinence  Other Topics Concern   Not on file  Social History Narrative   2 children   Social Determinants of Health   Financial Resource Strain: Low Risk    Difficulty of Paying Living Expenses: Not hard at all  Food Insecurity: No Food Insecurity   Worried About Charity fundraiser in the Last Year: Never true   [place] in the Last Year: Never true  Transportation Needs: No Transportation Needs   Lack of Transportation (Medical): No   Lack of Transportation (Non-Medical): No  Physical Activity: Inactive   Days of Exercise per Week: 0 days   Minutes of Exercise per Session: 0 min  Stress: No Stress Concern Present   Feeling of Stress : Not at all  Social Connections: Moderately Integrated   Frequency of Communication with Friends and Family: More than three times a week   Frequency of Social Gatherings with Friends and Family: More than three times a week   Attends Religious Services: 1 to 4 times per year   Active Member of [employer] or Organizations: Yes   Attends Archivist Meetings: 1 to 4 times per year   Marital Status: Never married  Human resources officer Violence: Not At Risk   Fear of Current or Ex-Partner: No   Emotionally Abused: No   Physically Abused: No   Sexually Abused: No   Family History  Problem Relation Age of Onset   Asthma Maternal Grandmother    Colon polyps Maternal Grandmother    Colon cancer Maternal Grandmother    Esophageal cancer Neg Hx    Rectal cancer Neg Hx    Stomach cancer Neg Hx    Pancreatic cancer Neg Hx    Liver  disease Neg Hx       Vanessa Kick, MD 04/07/21 1425

## 2021-04-14 ENCOUNTER — Encounter (HOSPITAL_COMMUNITY): Payer: Self-pay | Admitting: *Deleted

## 2021-04-14 ENCOUNTER — Emergency Department (HOSPITAL_COMMUNITY)
Admission: EM | Admit: 2021-04-14 | Discharge: 2021-04-16 | Disposition: A | Discharge status: Other Acute Inpatient Hospital | Payer: 59 | Attending: Emergency Medicine | Admitting: Emergency Medicine

## 2021-04-14 DIAGNOSIS — Z79899 Other long term (current) drug therapy: Secondary | ICD-10-CM | POA: Diagnosis not present

## 2021-04-14 DIAGNOSIS — R4589 Other symptoms and signs involving emotional state: Secondary | ICD-10-CM | POA: Diagnosis present

## 2021-04-14 DIAGNOSIS — R45851 Suicidal ideations: Secondary | ICD-10-CM | POA: Diagnosis not present

## 2021-04-14 DIAGNOSIS — I10 Essential (primary) hypertension: Secondary | ICD-10-CM | POA: Insufficient documentation

## 2021-04-14 DIAGNOSIS — F312 Bipolar disorder, current episode manic severe with psychotic features: Secondary | ICD-10-CM | POA: Diagnosis not present

## 2021-04-14 DIAGNOSIS — R45850 Homicidal ideations: Secondary | ICD-10-CM | POA: Diagnosis not present

## 2021-04-14 DIAGNOSIS — Z20822 Contact with and (suspected) exposure to covid-19: Secondary | ICD-10-CM | POA: Insufficient documentation

## 2021-04-14 DIAGNOSIS — Z046 Encounter for general psychiatric examination, requested by authority: Secondary | ICD-10-CM | POA: Insufficient documentation

## 2021-04-14 LAB — COMPREHENSIVE METABOLIC PANEL
ALT: 13 U/L (ref 0–44)
AST: 16 U/L (ref 15–41)
Albumin: 3.9 g/dL (ref 3.5–5.0)
Alkaline Phosphatase: 81 U/L (ref 38–126)
Anion gap: 8 (ref 5–15)
BUN: 9 mg/dL (ref 6–20)
CO2: 26 mmol/L (ref 22–32)
Calcium: 9 mg/dL (ref 8.9–10.3)
Chloride: 102 mmol/L (ref 98–111)
Creatinine, Ser: 0.66 mg/dL (ref 0.44–1.00)
GFR, Estimated: >60 mL/min (ref >60)
Glucose, Bld: 143 mg/dL — ABNORMAL HIGH (ref 70–99)
Potassium: 3.8 mmol/L (ref 3.5–5.1)
Sodium: 136 mmol/L (ref 135–145)
Total Bilirubin: 0.3 mg/dL (ref 0.3–1.2)
Total Protein: 7.6 g/dL (ref 6.5–8.1)

## 2021-04-14 LAB — CBC
HCT: 37.7 % (ref 36.0–46.0)
Hemoglobin: 11.5 g/dL — ABNORMAL LOW (ref 12.0–15.0)
MCH: 25.7 pg — ABNORMAL LOW (ref 26.0–34.0)
MCHC: 30.5 g/dL (ref 30.0–36.0)
MCV: 84.2 fL (ref 80.0–100.0)
Platelets: 327 10*3/uL (ref 150–400)
RBC: 4.48 MIL/uL (ref 3.87–5.11)
RDW: 15.1 % (ref 11.5–15.5)
WBC: 7.4 10*3/uL (ref 4.0–10.5)
nRBC: 0 % (ref 0.0–0.2)

## 2021-04-14 LAB — RAPID URINE DRUG SCREEN, HOSP PERFORMED
Amphetamines: NOT DETECTED
Barbiturates: NOT DETECTED
Benzodiazepines: NOT DETECTED
Cocaine: NOT DETECTED
Opiates: POSITIVE — AB
Tetrahydrocannabinol: POSITIVE — AB

## 2021-04-14 LAB — URINALYSIS, ROUTINE W REFLEX MICROSCOPIC
Bilirubin Urine: NEGATIVE
Glucose, UA: NEGATIVE mg/dL
Hgb urine dipstick: NEGATIVE
Ketones, ur: NEGATIVE mg/dL
Leukocytes,Ua: NEGATIVE
Nitrite: NEGATIVE
Protein, ur: NEGATIVE mg/dL
Specific Gravity, Urine: 1.018 (ref 1.005–1.030)
pH: 7 (ref 5.0–8.0)

## 2021-04-14 LAB — ETHANOL: Alcohol, Ethyl (B): <10 mg/dL (ref <10)

## 2021-04-14 LAB — I-STAT BETA HCG BLOOD, ED (MC, WL, AP ONLY): I-stat hCG, quantitative: <5 m[IU]/mL (ref <5)

## 2021-04-14 MED ORDER — HYDROCODONE-ACETAMINOPHEN 10-325 MG PO TABS
1.0000 | ORAL_TABLET | Freq: Four times a day (QID) | ORAL | Status: DC | PRN
  Administered 2021-04-14 – 2021-04-16 (×7): 1 via ORAL
  Filled 2021-04-14 (×7): qty 1

## 2021-04-14 MED ORDER — SUVOREXANT 20 MG PO TABS: 20.0000 mg | ORAL_TABLET | Freq: Every day | ORAL | Status: DC

## 2021-04-14 MED ORDER — NICOTINE 14 MG/24HR TD PT24
14.0000 mg | MEDICATED_PATCH | Freq: Once | TRANSDERMAL | Status: AC
Start: 2021-04-14 — End: 2021-04-15
  Administered 2021-04-14: 14 mg via TRANSDERMAL
  Filled 2021-04-14: qty 1

## 2021-04-14 MED ORDER — LORAZEPAM 1 MG PO TABS
1.0000 mg | ORAL_TABLET | Freq: Once | ORAL | Status: AC
  Administered 2021-04-14: 1 mg via ORAL
  Filled 2021-04-14: qty 1

## 2021-04-14 MED ORDER — ALBUTEROL SULFATE HFA 108 (90 BASE) MCG/ACT IN AERS: 2.0000 | INHALATION_SPRAY | Freq: Four times a day (QID) | RESPIRATORY_TRACT | Status: DC | PRN

## 2021-04-14 MED ORDER — ZIPRASIDONE MESYLATE 20 MG IM SOLR
20.0000 mg | Freq: Once | INTRAMUSCULAR | Status: AC
Start: 2021-04-14 — End: 2021-04-14
  Administered 2021-04-14: 20 mg via INTRAMUSCULAR
  Filled 2021-04-14: qty 20

## 2021-04-14 MED ORDER — QUETIAPINE FUMARATE 100 MG PO TABS
800.0000 mg | ORAL_TABLET | Freq: Every day | ORAL | Status: DC
  Administered 2021-04-14 – 2021-04-15 (×2): 800 mg via ORAL
  Filled 2021-04-14 (×2): qty 8

## 2021-04-14 MED ORDER — LORAZEPAM 2 MG/ML IJ SOLN
2.0000 mg | Freq: Once | INTRAMUSCULAR | Status: AC
  Administered 2021-04-14: 2 mg via INTRAMUSCULAR
  Filled 2021-04-14: qty 1

## 2021-04-14 MED ORDER — STERILE WATER FOR INJECTION IJ SOLN
INTRAMUSCULAR | Status: AC
  Filled 2021-04-14: qty 10

## 2021-04-14 NOTE — ED Notes (Signed)
Pt with dressing on abdomen. Dressing dry and intact. Pt states she has an appointment tomorrow to have it checked at [facility]. Asked pt to look at wound for assessment. Pt doesn't want dressing removed until morning.
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
## 2021-04-14 NOTE — ED Notes (Signed)
Pt less agitated at this time and more cooperative. [place] [employer] signed off on at this time.
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
## 2021-04-14 NOTE — ED Notes (Signed)
Pt allowed last phone call for today.

## 2021-04-14 NOTE — ED Notes (Signed)
Pt continues to curse, yell, threaten and talk excessively.
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
## 2021-04-14 NOTE — ED Notes (Signed)
Pt allowed another phone call. Informed pt if she became upset that her phone calls would be cut off completely.

## 2021-04-14 NOTE — ED Notes (Signed)
Pt given phone call but became upset yelling and threatening other person on phone. Phone call ended.

## 2021-04-14 NOTE — ED Notes (Signed)
[place] PD remains at bedside. Pt has stopped yelling at this time. Informed PD, if patient continues to be cooperative will sign off on law enforcement.

## 2021-04-14 NOTE — BH Assessment (Signed)
Clinician attempted to make contact with pt's team in an effort to complete pt's [place] Assessment. Clinician was unable to make contact. TTS to attempt assessment again at a later time.

## 2021-04-14 NOTE — ED Notes (Signed)
Pt requesting a Geodon and ativan injection. "If I have to be here in this ER in this COT then I need to be comfortable" Educated that they do not get handed out by request. Only if the edp deem necessary. PO ativan ordered and given. Pt happy with outcome.

## 2021-04-14 NOTE — ED Notes (Signed)
Pt yelling "I've been hauled off in a police truck one to many times because I knew something was going to happen to my children. He's a scumbag."

## 2021-04-14 NOTE — ED Triage Notes (Signed)
Brought in by police for IVC

## 2021-04-14 NOTE — ED Provider Notes (Signed)
[facility] Provider Note   CSN: [id number] Arrival date & time: 04/14/21  1212     History  Chief Complaint  Patient presents with   V70.1    [name] is a 46 y.o. female.  HPI Patient presents under IVC.  History of bipolar disorder.  IVC by family member.  Reportedly making suicidal and homicidal statements.  Yelling and screaming here and really will not provide any history.  Has reportedly recently had electroconvulsive therapy.  Reportedly has been hallucinating.  Home Medications Prior to Admission medications   Medication Sig Start Date End Date Taking? Authorizing Provider  phentermine (ADIPEX-P) 37.5 MG tablet Take by mouth. 03/06/21  Yes [provider]  albuterol (VENTOLIN HFA) 108 (90 Base) MCG/ACT inhaler Inhale 2 puffs into the lungs every 6 (six) hours as needed for wheezing or shortness of breath. 09/26/20   Collene Gobble, MD  benztropine (COGENTIN) 1 MG tablet Take 1 mg by mouth 2 (two) times daily. 12/17/20   [provider]  buPROPion (WELLBUTRIN XL) 150 MG 24 hr tablet Take 1 a day for a week, then 2 a day 01/21/21   [provider]  doxycycline (VIBRAMYCIN) 100 MG capsule Take 1 capsule (100 mg total) by mouth 2 (two) times daily. 04/02/21   Vanessa Kick, MD  gabapentin (NEURONTIN) 600 MG tablet Take 600 mg by mouth 3 (three) times daily. 02/17/21   [provider]  HYDROcodone-acetaminophen (NORCO) 10-325 MG tablet hydrocodone 10 mg-acetaminophen 325 mg tablet  Take 1 tablet 3 times a day by oral route as needed for pain.    [provider]  lisinopril (ZESTRIL) 20 MG tablet Take 1 tablet (20 mg total) by mouth daily. 03/04/21   Baruch Gouty, FNP  loperamide (IMODIUM) 2 MG capsule Take by mouth. 2 tablets in the morning and 1 in the evenings    [provider]  MIRENA, 52 MG, 20 MCG/DAY IUD once. 08/19/20   [provider]  naloxone [facility]) nasal spray 4 mg/0.1 mL  SMARTSIG:Both Nares 03/21/21   [provider]  promethazine (PHENERGAN) 25 MG tablet Take 25 mg by mouth every 6 (six) hours as needed. 01/08/21   [provider]  Promethazine-DM 6.25-15 MG/5ML SOLN Take 5 mLs by mouth every 6 (six) hours as needed (Cough). 03/05/21   Collene Gobble, MD  QUEtiapine (SEROQUEL) 300 MG tablet Take 800 mg by mouth at bedtime. 11/21/14   [provider]  risperiDONE (RISPERDAL) 1 MG tablet Take 1 tab in the morning and 1 in the afternoon 01/21/21   [provider]  Suvorexant 20 MG TABS Take 20 mg by mouth at bedtime. 12/17/20   [provider]  valACYclovir (VALTREX) 500 MG tablet TAKE 1 TABLET (500 MG TOTAL) BY MOUTH DAILY AS NEEDED (FEVER BLISTER). 01/15/21   Susy Frizzle, MD      Allergies    Patient has no known allergies.    Review of Systems   Review of Systems  Physical Exam Updated Vital Signs BP (!) 135/95 (BP Location: Right Arm)    Pulse (!) 109    Temp 98.9 F (37.2 C) (Oral)    Resp 16    SpO2 97%  Physical Exam Vitals and nursing note reviewed.  Constitutional:      Comments: Patient is yelling and screaming and in handcuffs.  Cardiovascular:     Rate and Rhythm: Regular rhythm.  Skin:    General: Skin is warm.  Neurological:     Mental Status: She is alert.     Comments: Patient yelling and screaming and threatening to kill people around her.  States that if it is not her she will have family numbers, and kill [place].    ED Results / Procedures / Treatments   Labs (all labs ordered are listed, but only abnormal results are displayed) Labs Reviewed  COMPREHENSIVE METABOLIC PANEL - Abnormal; Notable for the following components:      Result Value   Glucose, Bld 143 (*)    All other components within normal limits  CBC - Abnormal; Notable for the following components:   Hemoglobin 11.5 (*)    MCH 25.7 (*)    All other components within normal limits  RAPID URINE DRUG SCREEN, HOSP  PERFORMED - Abnormal; Notable for the following components:   Opiates POSITIVE (*)    Tetrahydrocannabinol POSITIVE (*)    All other components within normal limits  RESP PANEL BY RT-PCR (FLU A&B, COVID) ARPGX2  ETHANOL  URINALYSIS, ROUTINE W REFLEX MICROSCOPIC  I-STAT BETA HCG BLOOD, ED ([facility], [facility], [facility] ONLY)    EKG None  Radiology No results found.  Procedures Procedures    Medications Ordered in ED Medications  ziprasidone (GEODON) injection 20 mg (20 mg Intramuscular Given 04/14/21 1234)  sterile water (preservative free) injection (  Given 04/14/21 1239)  LORazepam (ATIVAN) injection 2 mg (2 mg Intramuscular Given 04/14/21 1303)    ED Course/ Medical Decision Making/ A&P                           Medical Decision Making Amount and/or Complexity of Data Reviewed Labs: ordered.  Risk Prescription drug management.   Patient presents yelling and screaming.  History of bipolar disorder.  Required IM medication.  Patient is medically cleared.  First exam done.  We will have patient seen by TTS but will likely require inpatient treatment.  Pending verification of home medicines before home medicines are ordered.        Final Clinical Impression(s) / ED Diagnoses Final diagnoses:  Bipolar affective disorder, currently manic, severe, with psychotic features [facility])    Rx / DC Orders ED Discharge Orders     None         Davonna Belling, MD 04/14/21 1531

## 2021-04-15 LAB — RESP PANEL BY RT-PCR (FLU A&B, COVID) ARPGX2
Influenza A by PCR: NEGATIVE
Influenza B by PCR: NEGATIVE
SARS Coronavirus 2 by RT PCR: NEGATIVE

## 2021-04-15 MED ORDER — ZIPRASIDONE MESYLATE 20 MG IM SOLR: 20.0000 mg | INTRAMUSCULAR | Status: DC | PRN

## 2021-04-15 MED ORDER — NICOTINE 14 MG/24HR TD PT24
14.0000 mg | MEDICATED_PATCH | Freq: Once | TRANSDERMAL | Status: DC
  Administered 2021-04-15: 14 mg via TRANSDERMAL
  Filled 2021-04-15: qty 1

## 2021-04-15 MED ORDER — DIPHENHYDRAMINE HCL 50 MG/ML IJ SOLN
50.0000 mg | Freq: Once | INTRAMUSCULAR | Status: AC
Start: 2021-04-15 — End: 2021-04-15
  Administered 2021-04-15: 50 mg via INTRAMUSCULAR
  Filled 2021-04-15: qty 1

## 2021-04-15 MED ORDER — OLANZAPINE 5 MG PO TBDP: 10.0000 mg | ORAL_TABLET | Freq: Three times a day (TID) | ORAL | Status: DC | PRN

## 2021-04-15 MED ORDER — ZIPRASIDONE MESYLATE 20 MG IM SOLR: 20.0000 mg | Freq: Once | INTRAMUSCULAR | Status: DC

## 2021-04-15 MED ORDER — ZIPRASIDONE MESYLATE 20 MG IM SOLR: 20.0000 mg | Freq: Once | INTRAMUSCULAR | Status: AC

## 2021-04-15 MED ORDER — LORAZEPAM 2 MG/ML IJ SOLN
2.0000 mg | Freq: Once | INTRAMUSCULAR | Status: AC
Start: 2021-04-15 — End: 2021-04-15
  Administered 2021-04-15: 2 mg via INTRAMUSCULAR
  Filled 2021-04-15: qty 1

## 2021-04-15 MED ORDER — LORAZEPAM 1 MG PO TABS: 1.0000 mg | ORAL_TABLET | ORAL | Status: DC | PRN

## 2021-04-15 MED ORDER — ZIPRASIDONE MESYLATE 20 MG IM SOLR
INTRAMUSCULAR | Status: AC
  Administered 2021-04-15: 20 mg via INTRAMUSCULAR
  Filled 2021-04-15: qty 20

## 2021-04-15 MED ORDER — HALOPERIDOL LACTATE 5 MG/ML IJ SOLN
5.0000 mg | Freq: Once | INTRAMUSCULAR | Status: AC
  Administered 2021-04-15: 5 mg via INTRAMUSCULAR
  Filled 2021-04-15: qty 1

## 2021-04-15 MED ORDER — RISPERIDONE 1 MG PO TBDP
1.0000 mg | ORAL_TABLET | Freq: Two times a day (BID) | ORAL | Status: DC
  Administered 2021-04-16: 1 mg via ORAL
  Filled 2021-04-15 (×2): qty 1

## 2021-04-15 NOTE — ED Notes (Signed)
Pt requested to use the phone. Pt cussing on the phone. Phone taken away.

## 2021-04-15 NOTE — ED Notes (Signed)
[employer] signed off at this time

## 2021-04-15 NOTE — ED Notes (Signed)
Patient was cussing loudly asked to lower voice MotherFucker .I'm Tired of this shit my mom can go suck a big Fucking dick . I will walk the [address] before they get my money again.

## 2021-04-15 NOTE — ED Notes (Signed)
Patient is constantly cussing saying how she hates [place] and that her mother can suck her tit. I've asked numerous times to stop being loud and cussing that their was other people around and she said she doesn't "give a damn" Security at bedside.

## 2021-04-15 NOTE — ED Notes (Signed)
Patient in room angry over situation. States "I fucking hate [place], Dr. Alvino Chapel can suck my tit." Patient then states "I hope one of them dies, I want one of these staff people here to die." Patient earlier upset and yelling regarding daughter and states that she is going through chart and telling all her medical problems. Yelling about family trying to take away social security. Security called. Patient phone taken at this time and told due to behaviors cannot speak with anyone at this time. Has already had three phone calls today.

## 2021-04-15 NOTE — ED Notes (Signed)
Patient called [name] in [place] where she resides and told the property manager that no one is allowed in her apartment and not to give the access or the key.

## 2021-04-15 NOTE — ED Provider Notes (Signed)
Emergency Medicine Observation Re-evaluation Note  [name] is a 46 y.o. female, seen on rounds today.  Pt initially presented to the ED for complaints of V70.1 and Manic Behavior Currently, the patient is sleeping in room.  Physical Exam  BP (!) 135/95 (BP Location: Right Arm)    Pulse (!) 109    Temp 98.9 F (37.2 C) (Oral)    Resp 16    SpO2 97%  Physical Exam General: resting comfortably, NAD Lungs: normal WOB Psych: currently calm and resting  ED Course / MDM  EKG:   I have reviewed the labs performed to date as well as medications administered while in observation.  Recent changes in the last 24 hours include none.  Plan  Current plan is for inpatient treatment and placement. AZZURE GARABEDIAN is under involuntary commitment.      Lorelle Gibbs, [place] 04/15/21 [phone]

## 2021-04-15 NOTE — ED Notes (Signed)
[employer] at bedside at this time.

## 2021-04-15 NOTE — ED Notes (Signed)
Pt cussing in room. Pt requesting to speak to telepsych again.

## 2021-04-15 NOTE — ED Notes (Signed)
Patient on the phone with Levada Dy and she is loudly saying she "hates [place] and hope these Mother Zenovia Jordan" If they cut my disability out I'm going back to work. I don't care if its at [facility]" Nurse came in and took the phone and she was hollering at the nurse about her family and her disability.

## 2021-04-15 NOTE — ED Provider Notes (Signed)
Patient is agitated and cursing and aggressive.  She will gets intramuscular chemical sedation.   Sherwood Gambler, MD 04/15/21 2002

## 2021-04-15 NOTE — ED Notes (Signed)
Patient sated very loudly that she's not getting in no police truck. She would put up a fight, kick, scream and lay on the floor. She would follow behind them but she was not getting in the police car and they was not going to make her.

## 2021-04-15 NOTE — BH Assessment (Addendum)
Comprehensive Clinical Assessment (CCA) Note  04/15/2021 VIDYA BAMFORD [id number]  Discharge Disposition: Margorie John, PA-C, reviewed pt's chart and information and determined pt meets inpatient criteria. Pt's referral information will be faxed out to multiple hospitals, including [facility], for potential placement. Pt is to remain at [facility] until placement can be identified. This information was relayed to pt's team at 0243.  The patient demonstrates the following risk factors for suicide: Chronic risk factors for suicide include: psychiatric disorder of Bipolar Disorder 1, Recurrent, Severe, previous suicide attempts in December 2021, and medical illness of ongoing wound in abdomen . Acute risk factors for suicide include: family or marital conflict and social withdrawal/isolation. Protective factors for this patient include: positive social support, positive therapeutic relationship, responsibility to others (children, family), and hope for the future. Considering these factors, the overall suicide risk at this point appears to be none. Patient is not appropriate for outpatient follow up.  Therefore, no sitter is recommended for suicide precautions.  [facility] from 04/14/2021 in [place] [facility] from 04/02/2021 in [facility] ED from 02/25/2021 in [facility] [place] No Risk No Risk No Risk     Chief Complaint:  Chief Complaint  Patient presents with   V70.1   Manic Behavior   Visit Diagnosis:  Bipolar Disorder 1, Recurrent, Severe   CCA Screening, Triage and Referral (STR) Agness Sibrian is a 46 year old patient who was [facility] the [facility] under IVC paperwork. The IVC paperwork states:  "The respondent is diagnosed with bipolar disorder and had ECT Treatment at [facility] on Wednesday and [is] supposed to go back in March. Since midnight last night the respondent has been texting non-stop saying that something  bad is going to happen to her son. She told the petitioner to call everyone that something bad is going to happen, that everyone needed to go [place] and start storing food. The petitioner has seen these signs before and feels that the respondent is likely to harm herself if her condition continues to worsen."  When asked why she was brought to the hospital, pt states, "This has been going on for 13 years. I get 2 checks - one for me and one for my son. I've been on disability for 13 years. My aunt is my payee but it's about to change to me. They want my money." Pt denies SI, though she states she's experienced SI in the past. She acknowledges she attempted to kill herself while she was psychotic in December 2021, though she was unable to answer how she attempted to kill herself. Pt denies she has a plan to kill herself at this time. Pt denies HI, AVH, NSSIB, access to guns/weapons, engagement with the legal system (with the exception of a court date for her cousin on March 13 of which she's been subpoenaed), or SA.  Pt is oriented x5. Her recent/remote memory is intact. Pt was cooperative throughout the assessment process, though is manic and has pressured speech. Pt's insight, judgement, and impulse control is impaired at this time.  Patient Reported Information How did you hear about [place]? Legal System  What Is the Reason for Your Visit/Call Today? Pt was IVCed due to concerns regarding her safety and some mania she was displaying. When asked why she was brought to the hospital, pt states, "This has been going on for 13 years. I get 2 checks - one for me and one for my  son. Rene Paci been on disability for 13 years. My aunt is my payee but it's about to change to me. They want my money." Pt denies SI, though she states she's experienced SI in the past. She acknowledges she attempted to kill herself while she was psychotic in December 2021, though she was unable to answer how she attempted to kill herself. Pt  denies she has a plan to kill herself at this time. Pt denies HI, AVH, NSSIB, access to guns/weapons, engagement with the legal system (with the exception of a court date for her cousin on March 13 of which she's been subpoenaed), or SA.  How Long Has This Been Causing You Problems? > than 6 months  What Do You Feel Would Help You the Most Today? Medication(s) (Pt would like to be d/c)   Have You Recently Had Any Thoughts About Hurting Yourself? No  Are You Planning to Commit Suicide/Harm Yourself At This time? No   Have you Recently Had Thoughts About [place]? No (Phreesia 03/06/2020)  Are You Planning to Harm Someone at This Time? No  Explanation: No data recorded  Have You Used Any Alcohol or Drugs in the Past 24 Hours? No  How Long Ago Did You Use Drugs or Alcohol? No data recorded What Did You Use and How Much? No data recorded  Do You Currently Have a Therapist/Psychiatrist? Yes  Name of Therapist/Psychiatrist: Dr. Lendon Colonel   Have You Been Recently Discharged From Any Office Practice or Programs? No  Explanation of Discharge From Practice/Program: No data recorded    CCA Screening Triage Referral Assessment Type of Contact: Tele-Assessment  Telemedicine Service Delivery: Telemedicine service delivery: This service was provided via telemedicine using a 2-way, interactive audio and video technology  Is this Initial or Reassessment? Initial Assessment  Date Telepsych consult ordered in [facility]:  04/14/21  Time Telepsych consult ordered in [facility]:  1726  Location of Assessment: [facility]  Provider Location: [facility] Assessment Services   Collateral Involvement: None currently   Does Patient Have a Stage manager Guardian? No data recorded Name and Contact of Legal Guardian: No data recorded If Minor and Not Living with Parent(s), Who has Custody? N/A  Is CPS involved or ever been involved? -- (UTA)  Is APS involved or ever been involved? --  (UTA)   Patient Determined To Be At Risk for Harm To Self or Others Based on Review of Patient Reported Information or Presenting Complaint? No  Method: No data recorded Availability of Means: No data recorded Intent: No data recorded Notification Required: No data recorded Additional Information for Danger to Others Potential: No data recorded Additional Comments for Danger to Others Potential: No data recorded Are There Guns or Other Weapons in Your Home? No data recorded Types of Guns/Weapons: No data recorded Are These Weapons Safely Secured?                            No data recorded Who Could Verify You Are Able To Have These Secured: No data recorded Do You Have any Outstanding Charges, Pending Court Dates, Parole/Probation? No data recorded Contacted To Inform of Risk of Harm To Self or Others: -- (N/A)    Does Patient Present under Involuntary Commitment? Yes  IVC Papers Initial File Date: 04/14/21   [place] of Residence: [place]   Patient Currently Receiving the Following Services: Medication Management   Determination of Need: Urgent (48 hours)  Options For Referral: Other: Comment; Medication Management; Outpatient Therapy (Continuous Assessment at [facility])     CCA Biopsychosocial Patient Reported Schizophrenia/Schizoaffective Diagnosis in Past: No   Strengths: Pt is able to identify her thoughts, feelings, and concerns. She loves her children and grandchildren a great deal.   Mental Health Symptoms Depression:   Hopelessness; Change in energy/activity; Fatigue; Irritability   Duration of Depressive symptoms:  Duration of Depressive Symptoms: Greater than two weeks   Mania:   Recklessness; Irritability; Racing thoughts; Overconfidence   Anxiety:    Difficulty concentrating; Worrying   Psychosis:   Delusions   Duration of Psychotic symptoms:  Duration of Psychotic Symptoms: Less than six months   Trauma:   None   Obsessions:   None    Compulsions:   None   Inattention:   None   Hyperactivity/Impulsivity:   N/A   Oppositional/Defiant Behaviors:   Aggression towards people/animals; Angry; Argumentative   Emotional Irregularity:   Mood lability; Potentially harmful impulsivity; Intense/inappropriate anger   Other Mood/Personality Symptoms:   None noted    Mental Status Exam Appearance and self-care  Stature:   Average   Weight:   Overweight   Clothing:   -- (Pt is dressed in scrubs)   Grooming:   Normal   Cosmetic use:   None   Posture/gait:   Normal   Motor activity:   Not Remarkable   Sensorium  Attention:   Distractible   Concentration:   Focuses on irrelevancies   Orientation:   -- (UTA)   Recall/memory:   Normal   Affect and Mood  Affect:   Full Range   Mood:   Anxious   Relating  Eye contact:   Normal   Facial expression:   Responsive   Attitude toward examiner:   Cooperative   Thought and Language  Speech flow:  Flight of Ideas; Pressured   Thought content:   Appropriate to Mood and Circumstances   Preoccupation:   None   Hallucinations:   None   Organization:  No data recorded  [employer] of Knowledge:   Average   Intelligence:   Average   Abstraction:   Normal   Judgement:   Impaired   Reality Testing:   Variable   Insight:   Lacking   Decision Making:   Impulsive   Social Functioning  Social Maturity:   Irresponsible   Social Judgement:   Heedless   Stress  Stressors:   Illness; Transitions; Family conflict   Coping Ability:   Exhausted   Skill Deficits:   Environmental health practitioner; Self-control; Self-care; Communication   Supports:   Friends/Service system     Religion: Religion/Spirituality Are You A Religious Person?:  (UTA) How Might This Affect Treatment?: N/A  Leisure/Recreation: Leisure / Recreation Do You Have Hobbies?:  (UTA)  Exercise/Diet: Exercise/Diet Do You Exercise?:  (UTA) Have  You Gained or Lost A Significant Amount of Weight in the Past Six Months?:  (UTA) Do You Follow a Special Diet?:  (UTA) Do You Have Any Trouble Sleeping?:  (UTA) Explanation of Sleeping Difficulties: UTA   CCA Employment/Education Employment/Work Situation: Employment / Work Situation Employment Situation: On disability Why is Patient on Disability: Medical and physical complications How Long has Patient Been on Disability: 13 years Patient's Job has Been Impacted by Current Illness:  (N/A) Has Patient ever Been in the [employer]?: No  Education: Education Is Patient Currently Attending School?: No Last Grade Completed:  (UTA) Did You Attend College?:  (UTA)  Did You Have An Individualized Education Program (IIEP):  (UTA) Did You Have Any Difficulty At School?:  (UTA) Patient's Education Has Been Impacted by Current Illness:  (UTA)   CCA Family/Childhood History Family and Relationship History: Family history Marital status: Divorced Divorced, when?: UTA What types of issues is patient dealing with in the relationship?: UTA Additional relationship information: UTA Does patient have children?: Yes How many children?: 2 How is patient's relationship with their children?: Pt shares she has a 8 year old son and an adult daughter whom has 2 grandchildren that she loves very much  Childhood History:  Childhood History By whom was/is the patient raised?:  (UTA) Did patient suffer any verbal/emotional/physical/sexual abuse as a child?:  (UTA) Did patient suffer from severe childhood neglect?:  (UTA) Has patient ever been sexually abused/assaulted/raped as an adolescent or adult?:  (UTA) Was the patient ever a victim of a crime or a disaster?:  (UTA) Witnessed domestic violence?:  (UTA) Has patient been affected by domestic violence as an adult?:  Special educational needs teacher)  Child/Adolescent Assessment:     CCA Substance Use Alcohol/Drug Use: Alcohol / Drug Use Pain Medications: Please see  MAR Prescriptions: Please see MAR Over the Counter: Please see MAR History of alcohol / drug use?: No history of alcohol / drug abuse Longest period of sobriety (when/how long): Unknown Negative Consequences of Use:  (None noted) Withdrawal Symptoms:  (N/A)                         ASAM's:  Six Dimensions of Multidimensional Assessment  Dimension 1:  Acute Intoxication and/or Withdrawal Potential:      Dimension 2:  Biomedical Conditions and Complications:      Dimension 3:  Emotional, Behavioral, or Cognitive Conditions and Complications:     Dimension 4:  Readiness to Change:     Dimension 5:  Relapse, Continued use, or Continued Problem Potential:     Dimension 6:  Recovery/Living Environment:     ASAM Severity Score:    ASAM Recommended Level of Treatment: ASAM Recommended Level of Treatment:  (N/A)   Substance use Disorder (SUD) Substance Use Disorder (SUD)  Checklist Symptoms of Substance Use:  (N/A)  Recommendations for Services/Supports/Treatments: Recommendations for Services/Supports/Treatments Recommendations For Services/Supports/Treatments: Individual Therapy, Medication Management  Discharge Disposition: Margorie John, PA-C, reviewed pt's chart and information and determined pt meets inpatient criteria. Pt's referral information will be faxed out to multiple hospitals, including [facility], for potential placement. Pt is to remain at [facility] until placement can be identified. This information was relayed to pt's team at 0243.  DSM5 Diagnoses: Patient Active Problem List   Diagnosis Date Noted   Chronic pain syndrome 03/04/2021   Hypertension 03/04/2021   Lumbar radiculopathy 01/20/2021   OSA (obstructive sleep apnea) 10/04/2020   PONV (postoperative nausea and vomiting) 10/04/2020   Hx of hiatal hernia 09/13/2020   Sedative, hypnotic or anxiolytic use disorder, severe, in controlled environment ([place]) 09/11/2020   Lumbar spondylosis 08/29/2020   Pain  03/08/2020   Iron deficiency anemia due to chronic blood loss 11/09/2019   Sarcoidosis 04/26/2019   Abnormal CT of the chest 03/23/2019   Hypokalemia 02/24/2019   History of CVA (cerebrovascular accident) 11/16/2018   CVA (cerebrovascular accident) [facility])    Degeneration of lumbar intervertebral disc 12/13/2014   Anemia associated with nutritional deficiency 11/12/2014   Bipolar affective disorder ([place]) 09/28/2014   S/P laparoscopic sleeve gastrectomy 01/2014 09/28/2014   Abdominal wall seroma - chronic  Morbid obesity with BMI of 40.0-44.9, adult ([place]) 05/11/2013   Cholelithiases 04/25/2013   Smoker 09/19/2011   GERD 03/04/2010     Referrals to Alternative Service(s): Referred to Alternative Service(s):   Place:   Date:   Time:    Referred to Alternative Service(s):   Place:   Date:   Time:    Referred to Alternative Service(s):   Place:   Date:   Time:    Referred to Alternative Service(s):   Place:   Date:   Time:     Dannielle Burn, LMFT

## 2021-04-15 NOTE — Progress Notes (Signed)
Pt accepted to [facility] PENDING negative COVID-19.   Pt is accepted to [facility] to the [employer] 04/16/21 after 8am. [phone]. Accepting physician is Dr. Jonelle Sports. CSW communicated with nursing, Aliene Beams, RN about faxing COVID-19 results to [phone]. Pt meers inpatient behavioral health criteria per Margorie John, PA-C.   [name], MSW, [facility] 04/15/2021 5:01 PM

## 2021-04-15 NOTE — Progress Notes (Signed)
Patient has been denied by [facility] due to no appropriate beds available. Patient meets [facility] inpatient criteria per Margorie John, PA-C. Patient has been faxed out to the following facilities:    [facility]  [address] [place] [address] [phone] [place]  [address] [place] [address] [phone] [place]  [address], [place] [address] [phone] [place]  [address] [place] [address] [phone]  [facility]  [address] [place] [address] [phone] [facility]  [place], [place] [address] [phone]  [facility]  [address] [place], [place]  [address] [phone]  [facility]  [phone] [address] [place] [address] [phone] [facility]  [place] [address] [place] [address] [phone] [facility]  [address], [place] [address] [phone]  [facility]  [address], [place] [address] [place]  [facility]  [address] [place] [address] [phone] [place], MSW, LCSW-A  12:46 PM 04/15/2021

## 2021-04-15 NOTE — ED Notes (Signed)
Patient loudly stating to sitter that she will not leave out of here without a fight. States that she will not not transfer to another facility via police car. States that she will go out of here kicking and screaming and will hurt everyone she can on the way out. Phone taken from patient earlier because she made phone call to family member cursing them out because she was informed that she has met inpatient criteria per [facility] PA and they will be seeking placement for her.

## 2021-04-16 MED ORDER — ACETAMINOPHEN 500 MG PO TABS
1000.0000 mg | ORAL_TABLET | Freq: Once | ORAL | Status: AC
  Administered 2021-04-16: 1000 mg via ORAL
  Filled 2021-04-16: qty 2

## 2021-04-16 NOTE — ED Notes (Signed)
Pt enroute to [facility] under IVC with [employer].  Call to facility to update, report provided by previous shift

## 2021-04-16 NOTE — Progress Notes (Signed)
CSW spoke with assigned ED RN who confirmed that the patient's COVID results are negative. CSW encouraged RN to fax results and to give report to [facility] in preparation for transfer for today at anytime. ED RN agreed to do so.    Mariea Clonts, MSW, LCSW-A  8:30 AM 04/16/2021

## 2021-04-16 NOTE — ED Notes (Signed)
Report given to Lamonte Sakai, Therapist, sports at [facility].

## 2021-04-16 NOTE — ED Notes (Signed)
Pt took a shower 

## 2021-05-26 ENCOUNTER — Telehealth: Payer: Self-pay | Admitting: Emergency Medicine

## 2021-05-26 NOTE — Telephone Encounter (Signed)
Called and spoke with pt who is requesting to have a refill of promethazine-DM. Dr. Lamonte Sakai, please advise on this. ?

## 2021-05-27 MED ORDER — PROMETHAZINE-DM 6.25-15 MG/5ML PO SOLN: 5.0000 mL | Freq: Four times a day (QID) | ORAL | 0 refills | Status: DC | PRN

## 2021-05-27 NOTE — Telephone Encounter (Signed)
Please let her know that I refilled it for her.  Thank you ?

## 2021-05-27 NOTE — Telephone Encounter (Signed)
Called and notified patient that cough syrup has been refilled. Patient verbalized understanding. Nothing further needed  ?

## 2021-05-27 NOTE — Telephone Encounter (Signed)
Dr Lamonte Sakai Please advise:  ? ?Patient states she would like a refill on promethazine-DM.  However based on her last refill you stated you would not like for her to use this long term. I expressed to patient that I would ask you if it was okay to refill and if not that I would see if there was any other option for patient. She states this is the only cough medicine that helps her pt then disconnected call.  ?

## 2021-05-27 NOTE — Telephone Encounter (Signed)
Patient checking on cough syrup. Patient phone number is [phone]. ?

## 2021-06-04 ENCOUNTER — Encounter: Payer: Self-pay | Admitting: Hematology and Oncology

## 2021-07-01 ENCOUNTER — Ambulatory Visit: Payer: 59 | Admitting: Emergency Medicine

## 2021-07-29 ENCOUNTER — Ambulatory Visit (INDEPENDENT_AMBULATORY_CARE_PROVIDER_SITE_OTHER): Payer: 59

## 2021-07-29 ENCOUNTER — Ambulatory Visit (INDEPENDENT_AMBULATORY_CARE_PROVIDER_SITE_OTHER): Payer: 59 | Admitting: Emergency Medicine

## 2021-07-29 ENCOUNTER — Encounter: Payer: Self-pay | Admitting: Emergency Medicine

## 2021-07-29 VITALS — BP 118/74 | HR 97 | Temp 97.9°F | Ht 66.0 in | Wt 289.0 lb

## 2021-07-29 DIAGNOSIS — D869 Sarcoidosis, unspecified: Secondary | ICD-10-CM

## 2021-07-29 DIAGNOSIS — G4733 Obstructive sleep apnea (adult) (pediatric): Secondary | ICD-10-CM

## 2021-07-29 NOTE — Addendum Note (Signed)
Addended by: Gavin Potters R on: 07/29/2021 05:01 PM   Modules accepted: Orders

## 2021-07-29 NOTE — Assessment & Plan Note (Signed)
You might benefit from getting back on CPAP for your sleep apnea.  We can talk about this in the future.

## 2021-07-29 NOTE — Assessment & Plan Note (Signed)
Please keep your albuterol available to use 2 puffs if needed for shortness of breath, chest tightness, wheezing. We will not start any scheduled inhaler medication at this time We will check lab work, pulmonary function testing, CT scan of the chest in October 2023 to follow your sarcoidosis. Follow Dr. Lamonte Sakai in October with pulmonary function testing on the same day.

## 2021-07-29 NOTE — Patient Instructions (Signed)
Please keep your albuterol available to use 2 puffs if needed for shortness of breath, chest tightness, wheezing. We will not start any scheduled inhaler medication at this time We will check lab work, pulmonary function testing, CT scan of the chest in October 2023 to follow your sarcoidosis. You might benefit from getting back on CPAP for your sleep apnea.  We can talk about this in the future. Follow Dr. Lamonte Sakai in October with pulmonary function testing on the same day.

## 2021-07-29 NOTE — Progress Notes (Signed)
   Subjective:    Patient ID: [name], female    DOB: 1975-05-09, 46 y.o.   MRN: [id number]  HPI  ROV 01/03/21 --follow-up visit 46 year old woman with history of obesity, sarcoidosis and associated mixed obstruction and restrictive lung disease.  Past medical history also significant for OSA not on CPAP, cerebellar CVA, bipolar.  We repeated her CT chest for surveillance as below.  Today she reports that she has been doing well overall. Good functional capacity. She is dealing with back pain, mid back myalgias. She uses albuterol about 1-2x a month.   CT chest 12/17/2020 reviewed by me, shows bilateral hilar mediastinal adenopathy, slightly smaller than prior, stable scattered bilateral pulmonary nodules, largest 7 mm in the right lower lobe, small bilateral effusions  ROV 07/29/21 --[name] is 45 and has a history of obesity with OSA, not on CPAP.  I follow her for this as well as sarcoidosis with associated restrictive disease, possible obstructive disease.  She also has bipolar disorder and a history of cerebrovascular disease and cerebellar stroke.  Review of the notes indicates that she was hospitalized at [facility] 07/08/2021 for left-sided weakness.  MRI brain was unremarkable. Today she reports that she is feeling well currently. She has gained some wt over the last 6 months. Her L sided weakness is resolved. She rarely needs albuterol.   Chest x-ray today reviewed by me shows clear lungs, no infiltrates, no evidence of mediastinal adenopathy   Review of Systems As per HPI     Objective:   Physical Exam  Vitals:   07/29/21 1620  BP: 118/74  Pulse: 97  Temp: 97.9 F (36.6 C)  TempSrc: Oral  SpO2: 97%  Weight: 289 lb (131.1 kg)  Height: '5\' 6"'$  (1.676 m)    Gen: Pleasant, obese woman, in no distress  ENT: No lesions,  mouth clear,  oropharynx clear, narrow post pharynx, no postnasal drip  Neck: No JVD, no stridor, strong voice  Lungs: No use of accessory muscles,  no crackles or wheezing on normal respiration, no wheeze on forced expiration  Cardiovascular: RRR, heart sounds normal, no murmur or gallops, no peripheral edema  Musculoskeletal: No deformities, no cyanosis or clubbing  Neuro: alert, awake, non-focal  Skin: Warm, no lesions or rash      Assessment & Plan:  Sarcoidosis Please keep your albuterol available to use 2 puffs if needed for shortness of breath, chest tightness, wheezing. We will not start any scheduled inhaler medication at this time We will check lab work, pulmonary function testing, CT scan of the chest in October 2023 to follow your sarcoidosis. Follow Dr. Lamonte Sakai in October with pulmonary function testing on the same day.  OSA (obstructive sleep apnea) You might benefit from getting back on CPAP for your sleep apnea.  We can talk about this in the future.  Baltazar Apo, MD, PhD 07/29/2021, 4:50 PM [facility] [phone] or if no answer [phone]

## 2021-09-21 ENCOUNTER — Ambulatory Visit: Payer: Self-pay

## 2021-10-09 IMAGING — CT CT ANGIO CHEST
2 of 8 series · 15 of 36 positions shown · IV contrast (omnipaque)
Comparison: Chest CT dated 03/17/2019.

CLINICAL DATA: 44-year-old female with positive D-dimer.
Hemoptysis. Concern for pulmonary embolism.

EXAM:
CT ANGIOGRAPHY CHEST WITH CONTRAST
TECHNIQUE: Multidetector CT imaging of the chest was performed using the
standard protocol during bolus administration of intravenous
contrast. Multiplanar CT image reconstructions and MIPs were
obtained to evaluate the vascular anatomy.
CONTRAST:  100mL OMNIPAQUE IOHEXOL 350 MG/ML SOLN

[Series 8: thins · axial · 0.78mm/px · z∈[+849,+1088]mm · 14 of 277 slices shown]
[im 19/277  lung]
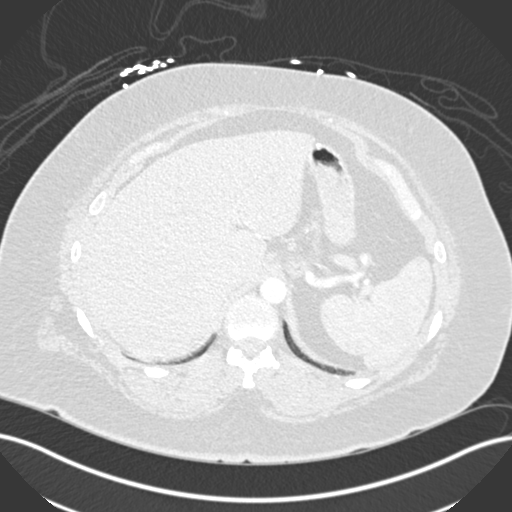
[im 37/277  mediastinal]
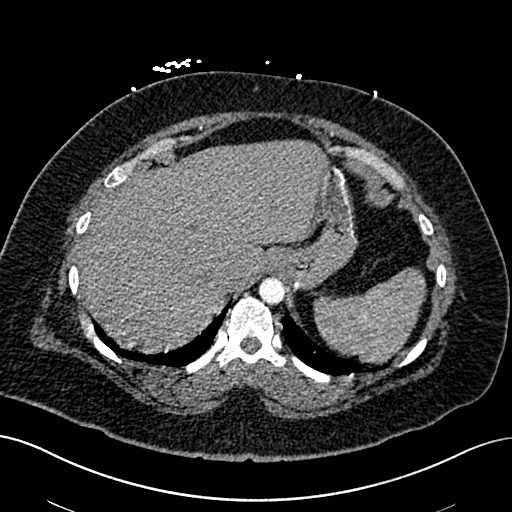
[im 56/277  lung]
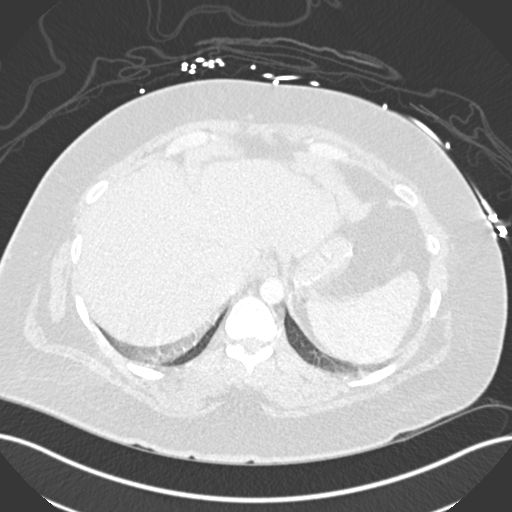
[im 74/277  mediastinal]
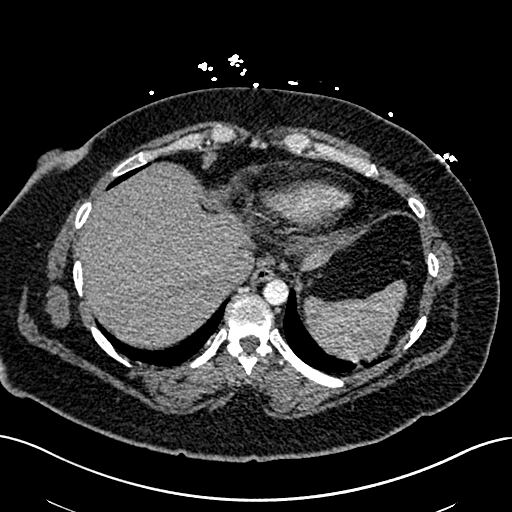
[im 93/277  lung]
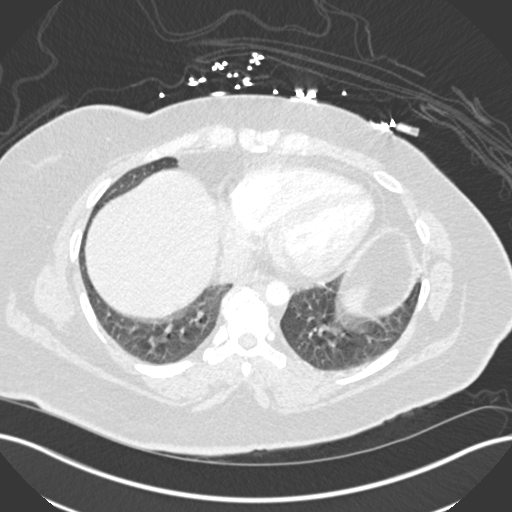
[im 111/277  mediastinal]
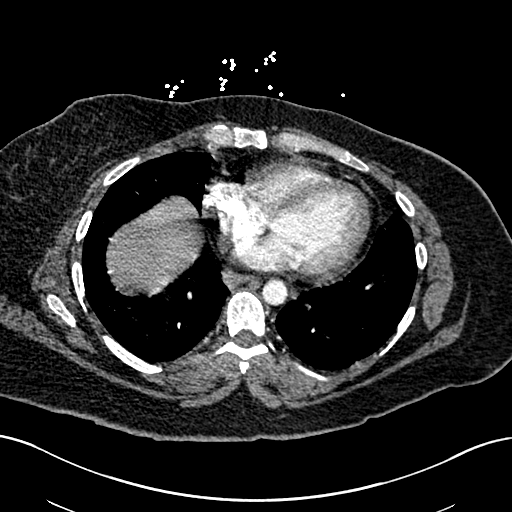
[im 129/277  lung]
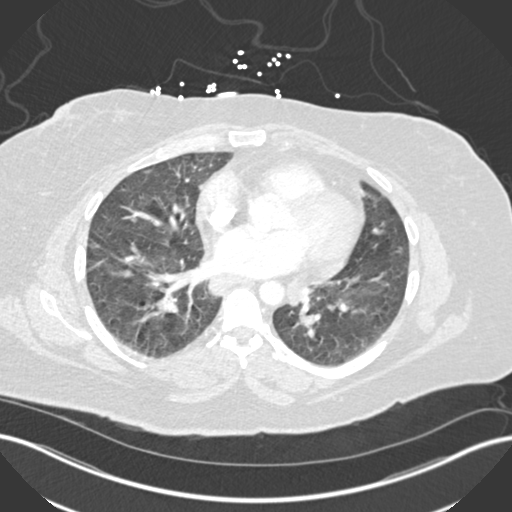
[im 148/277  mediastinal]
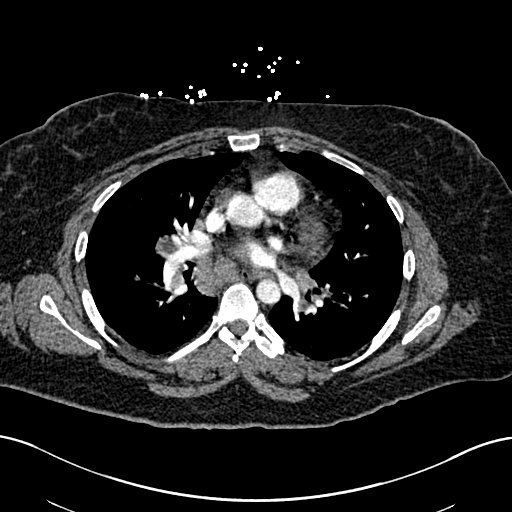
[im 166/277  lung]
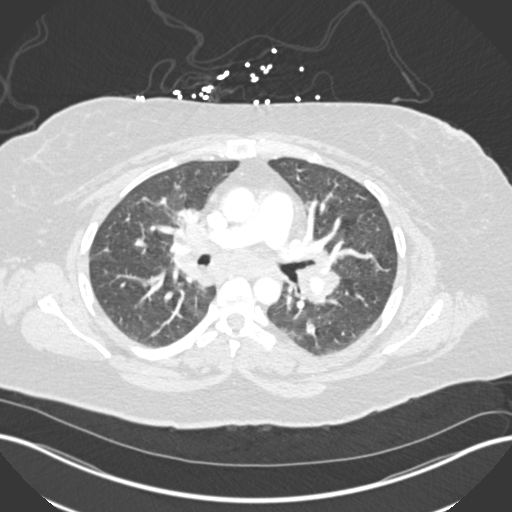
[im 185/277  mediastinal]
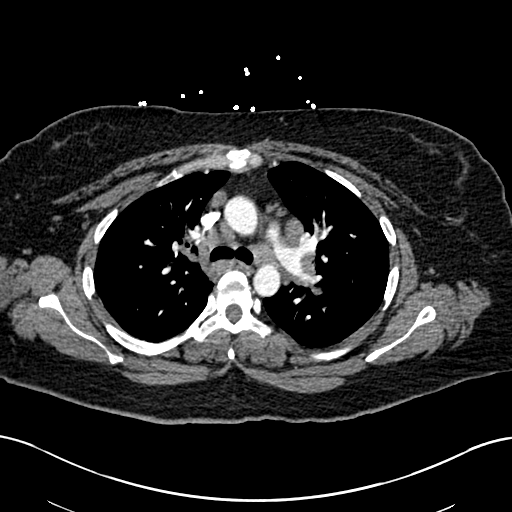
[im 203/277  lung]
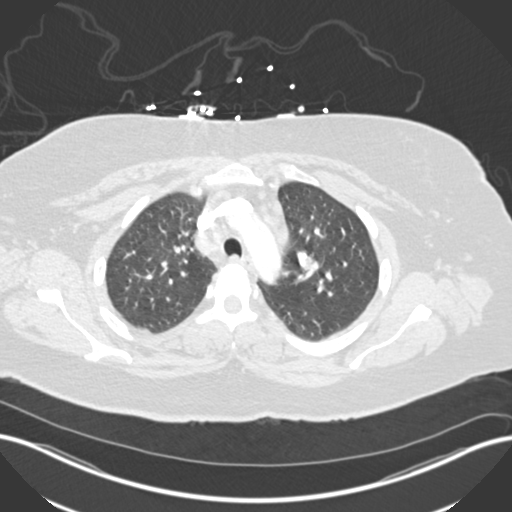
[im 221/277  mediastinal]
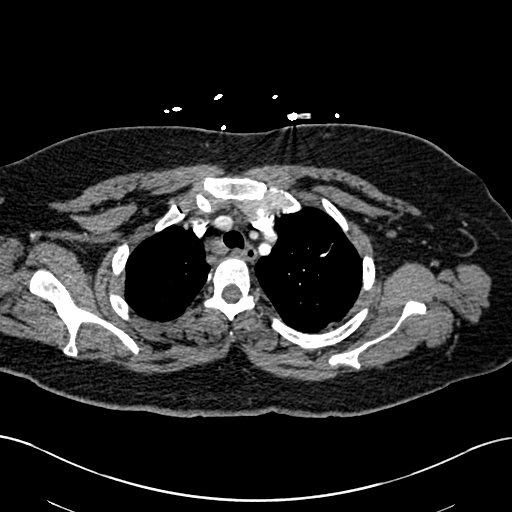
[im 240/277  lung]
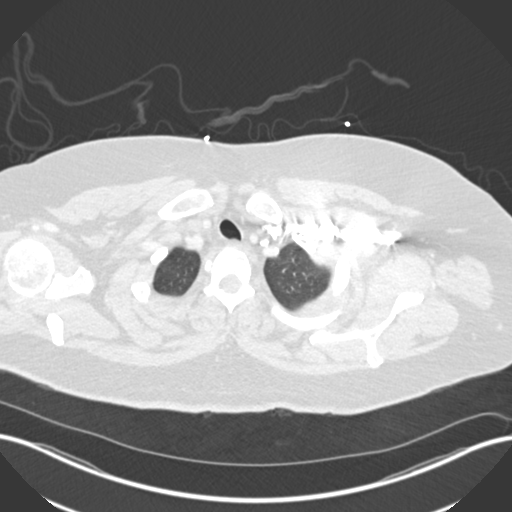
[im 258/277  mediastinal]
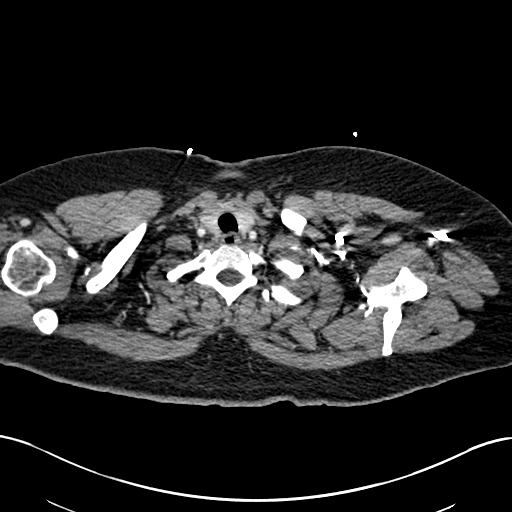

[Series 10: coronal mpr · coronal · 0.56mm/px · 1 of 151 slices shown]
[im 76/151  mediastinal]
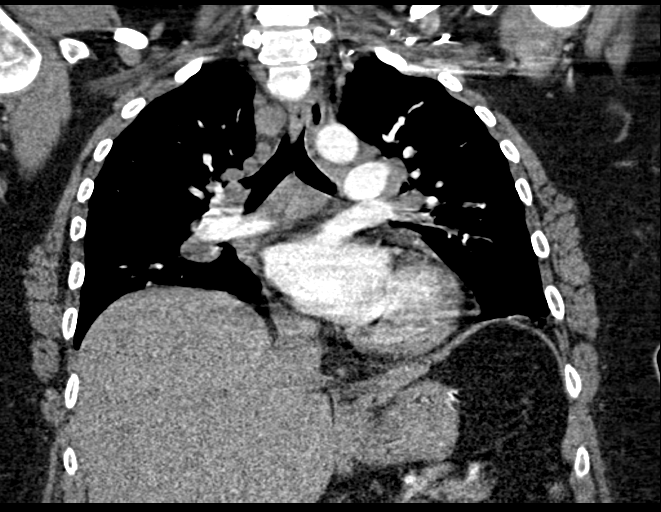

[15 of 36 positions shown; findings below may reference images not displayed]

FINDINGS: Evaluation of this exam is limited due to respiratory motion
artifact.

Cardiovascular: There is no cardiomegaly or pericardial effusion.
The thoracic aorta is unremarkable. The origins of the great vessels
of the aortic arch appear patent. Evaluation of the pulmonary
arteries is limited due to respiratory motion artifact. No definite
large central pulmonary artery embolus identified.

Mediastinum/Nodes: Bilateral hilar and mediastinal adenopathy
similar or increased compared to prior CT. The esophagus and the
thyroid gland are grossly unremarkable. No mediastinal fluid
collection.

Lungs/Pleura: Several scattered bilateral pulmonary nodules measure
up to 7 mm in the right lower lobe (70/9) similar to prior CT. No
new consolidation. There is no pleural effusion pneumothorax. The
central airways are patent.

Upper Abdomen: Postsurgical changes of gastric bypass.

Musculoskeletal: No chest wall abnormality. No acute or significant
osseous findings.

Review of the MIP images confirms the above findings.
IMPRESSION: 1. No acute intrathoracic pathology. No CT evidence of central
pulmonary artery embolus.
2. Bilateral hilar and mediastinal adenopathy slightly increased
compared to prior CT.
3. Scattered bilateral pulmonary nodules similar to prior CT.

## 2021-10-19 ENCOUNTER — Ambulatory Visit (HOSPITAL_COMMUNITY)
Admission: EM | Admit: 2021-10-19 | Discharge: 2021-10-19 | Disposition: A | Discharge status: Home / Self Care | Payer: 59 | Attending: Behavioral Health | Admitting: Behavioral Health

## 2021-10-19 DIAGNOSIS — F411 Generalized anxiety disorder: Secondary | ICD-10-CM | POA: Diagnosis present

## 2021-10-19 DIAGNOSIS — R079 Chest pain, unspecified: Secondary | ICD-10-CM | POA: Insufficient documentation

## 2021-10-19 DIAGNOSIS — F419 Anxiety disorder, unspecified: Secondary | ICD-10-CM | POA: Diagnosis not present

## 2021-10-19 DIAGNOSIS — Z91148 Patient's other noncompliance with medication regimen for other reason: Secondary | ICD-10-CM | POA: Insufficient documentation

## 2021-10-19 DIAGNOSIS — Z79899 Other long term (current) drug therapy: Secondary | ICD-10-CM | POA: Diagnosis not present

## 2021-10-19 NOTE — ED Triage Notes (Signed)
Pt presents to [facility] seeking substance use treament. Pt states she has been abusing her prescription medication; lithium, seroquel, and geodon. Pt states "I just need help". Pt states she ran out of her medication today and does not receive a refill until 8/29. Pt denies SI/HI and AVH.

## 2021-10-19 NOTE — ED Provider Notes (Signed)
Behavioral [facility] Urgent [facility] Medical Screening Exam  Patient Name: Melissa [name] MRN: [id number] Date of Evaluation: 10/19/21 Chief Complaint:   Diagnosis:  Final diagnoses:  Anxiety state  Noncompliance with medication treatment due to abuse of medication    History of Present illness: Melissa [name] is a 46 y.o. female patient who presented to the [facility] voluntary as a walk in accompanied by her mother Melissa [name] with a chief complaint of  running out of psychotropic medications, misusing hydrocodone and increased anxiety.   Patient is seen and evaluated face-to-face by this provider, and chart reviewed. Patient has a past psychiatric history significant of bipolar disorder, mixed, with psychotic features, social anxiety disorder, panic disorder, and insomnia. Per chart review, patient was seen at the [facility] emergency department on today, and on 10/18/2021 with complaints of chest pain and misusing medications.  On evaluation, patient is alert and oriented to person, place, time, and situation. Her thought process is linear and speech is clear and coherent. Her mood is anxious and affect is congruent. She has fair eye contact.  She appears well groomed and is casually dressed.  Patient states that she ran out of her lithium, Seroquel, and Geodon 2 days ago. She states that she gets her next refill on 09/20/2021. She states that she was taking a little extra of her Geodon and Seroquel to help with her anxiety. She states that she's been off of her medications for the past two days. She reports increased anxiety for the past 2 days due to physical complaints, [name] pain, and stomach pain. She describes her anxiety as chest pain, chest tightening and feeling overwhelmed. She denies physical complaints at this time but states that she woke up feeling like she was having a heart attack. She states that she is prescribed hydrocodone by [facility] medical and that she has been taking 4 to 5 pills/day to  treat her pain. Per PDMP, Patient filled Hydrocodone-Acetamin 10-325 Mg 150 tablets x 30 days on 10/17/21. She states that her mother has her hydrocodone and is going to manage her medications for a couple days.   She denies SI/HI. She denies AVH. There is no objective evidence that the patient is currently responding to internal or external stimuli. She reports fair sleep. She reports a fair appetite. She denies recent depressive or manic episodes. She denies using illicit drugs. She states that she stopped drinking alcohol on September 20, 2021.  She states that she lives alone. She follows up with Dr. Erling Cruz at [facility] for medication management.   Patient's mother Melissa [name], states that the patient doesn't hear her name sometimes when called, doesn't respond to her sometimes and forgets things. She states that her daughter needs rehab for overtaking medications.   Plan: I discussed with the patient to importance of taking her psychotropics and pain medications as prescribed and the lethalities associated with medication abuse. I discussed with the patient contact Dr. Erling Cruz on 10/20/21 to discuss medication management and anxiety. I recommended she follow up with outpatient therapy to address anxiety. She was provided with outpatient resources for therapy. I dicussed with the patient follow up with her pcp at [facility] to discuss chronic pain, break through pain and medication options that are safe for the patient. Patient provided with substance abuse resources. Patient and her mother advised that if the patient's symptoms worsen to call 911, present to the nearest ED or [facility] for an evaluation.    Psychiatric Specialty Exam  Presentation  General  Appearance:Appropriate for Environment  Eye Contact:Fair  Speech:Clear and Coherent  Speech Volume:Normal  Handedness:Right   Mood and Affect  Mood:Anxious  Affect:Appropriate   Thought Process  Thought Processes:Coherent  Descriptions of  Associations:Intact  Orientation:Full (Time, Place and Person)  Thought Content:Logical  Diagnosis of Schizophrenia or Schizoaffective disorder in past: No  Duration of Psychotic Symptoms: Less than six months  Hallucinations:None  Ideas of Reference:None  Suicidal Thoughts:No  Homicidal Thoughts:No   Sensorium  Memory:Immediate Fair; Recent Fair; Remote Fair  Judgment:Intact  Insight:Present   Executive Functions  Concentration:Fair  Attention Span:Fair  [place]   Psychomotor Activity  Psychomotor Activity:Normal   Assets  Assets:Communication Skills; Desire for Improvement; Financial Resources/Insurance; Housing; Physical Health; Leisure Time; Social Support   Sleep  Sleep:Fair  Number of hours: 9   Physical Exam: Physical Exam Cardiovascular:     Rate and Rhythm: Normal rate.  Pulmonary:     Effort: Pulmonary effort is normal.  Musculoskeletal:        General: Normal range of motion.     Cervical [name]: Normal range of motion.  Neurological:     Mental Status: She is alert and oriented to person, place, and time.    Review of Systems  Constitutional: Negative.   HENT: Negative.    Eyes: Negative.   Respiratory: Negative.  Negative for shortness of breath.   Cardiovascular:  Negative for chest pain and palpitations.  Neurological: Negative.   Psychiatric/Behavioral:  The patient is nervous/anxious.    Blood pressure 131/68, pulse 100, temperature 98.2 F (36.8 C), temperature source Oral, resp. rate 18, SpO2 98 %. There is no height or weight on file to calculate BMI.  Musculoskeletal: Strength & Muscle Tone: within normal limits Gait & Station: normal Patient leans: N/A   [place] MSE Discharge Disposition for Follow up and Recommendations: Based on my evaluation the patient does not appear to have an emergency medical condition and can be discharged with resources and follow up care in  outpatient services for Medication Management, Individual Therapy, and Group Therapy  Discharge recommendations:  Patient is to take medications as prescribed. DO NOT MISUSE PRESCRIBED MEDICATIONS.  Please see information for follow-up appointment with psychiatry and therapy. Please follow up with your primary care provider for all medical related needs.   Therapy: We recommend that patient participate in individual therapy to address anxiety.   Medications: The patient is to contact a medical professional and/or outpatient provider to address any new side effects that develop. The patient should update outpatient providers of any new medications and/or medication changes.   Atypical antipsychotics: If you are prescribed an atypical antipsychotic, it is recommended that your height, weight, BMI, blood pressure, fasting lipid panel, and fasting blood sugar be monitored by your outpatient providers.  Safety:  The patient should abstain from use of illicit substances/drugs and abuse of any medications. If symptoms worsen or do not continue to improve or if the patient becomes actively suicidal or homicidal then it is recommended that the patient return to the closest hospital emergency department, the [facility], or call 911 for further evaluation and treatment. National Suicide Prevention Lifeline 1-800-SUICIDE or 913-644-4162.  About 988 988 offers 24/7 access to trained crisis counselors who can help people experiencing mental health-related distress. People can call or text 988 or chat 988lifeline.org for themselves or if they are worried about a loved one who may need crisis support.    Follow-up Information  Call  [facility], Dr. Erling Cruz.   Why: If symptoms worsen and for medication managment. Contact information: [place]   [address]   [place], [place] [address]    [place], [address].   Why: If symptoms worsen and for medication managment. Contact information: [place] [address] [phone]                 Please contact one of the following facilities to start medication management and therapy services:   [facility] [address]  [place], [place] [address] [phone]   [place]  [name] [place], [place] [address] [phone]  [place]  [address] Ignacia Marvel [place], [place] [address] [phone]  [facility]  [address] [place]  [place], [place] [address] [phone]  [facility]  [address] [place], [place] [address] [phone]  [facility]  [address]rett Ables  [place], [place] [address] [phone]     Marissa Calamity, NP 10/19/2021, 12:45 PM

## 2021-10-19 NOTE — Discharge Instructions (Addendum)
Discharge recommendations:  Patient is to take medications as prescribed. DO NOT MISUSE PRESCRIBED MEDICATIONS.  Please see information for follow-up appointment with psychiatry and therapy. Please follow up with your primary care provider for all medical related needs.   Therapy: We recommend that patient participate in individual therapy to address anxiety.   Medications: The patient is to contact a medical professional and/or outpatient provider to address any new side effects that develop. The patient should update outpatient providers of any new medications and/or medication changes.   Atypical antipsychotics: If you are prescribed an atypical antipsychotic, it is recommended that your height, weight, BMI, blood pressure, fasting lipid panel, and fasting blood sugar be monitored by your outpatient providers.  Safety:  The patient should abstain from use of illicit substances/drugs and abuse of any medications. If symptoms worsen or do not continue to improve or if the patient becomes actively suicidal or homicidal then it is recommended that the patient return to the closest hospital emergency department, the [facility], or call 911 for further evaluation and treatment. National Suicide Prevention Lifeline 1-800-SUICIDE or 914-729-0979.  About 988 988 offers 24/7 access to trained crisis counselors who can help people experiencing mental health-related distress. People can call or text 988 or chat 988lifeline.org for themselves or if they are worried about a loved one who may need crisis support.   Please contact one of the following facilities to start medication management and therapy services:   [facility] [address]  [place], [place] [address] [phone]   [place]  [address] [place], [place] [address] [phone]  [place]   [address] Ignacia Marvel [place], [place] [address] [phone]  [facility]  [address] [place]  [place], [place] [address] [phone]  [facility]  [place], [place] [address] [phone]  [place]  [address],  [place],  [address] [phone]

## 2021-10-21 ENCOUNTER — Encounter: Payer: Self-pay | Admitting: Hematology and Oncology

## 2021-11-03 ENCOUNTER — Other Ambulatory Visit: Payer: Self-pay

## 2021-11-03 ENCOUNTER — Encounter: Payer: Self-pay | Admitting: Hematology and Oncology

## 2021-11-03 ENCOUNTER — Emergency Department (HOSPITAL_COMMUNITY)
Admission: EM | Admit: 2021-11-03 | Discharge: 2021-11-11 | Disposition: A | Discharge status: Other Acute Inpatient Hospital | Payer: 59 | Attending: Emergency Medicine | Admitting: Emergency Medicine

## 2021-11-03 DIAGNOSIS — Z20822 Contact with and (suspected) exposure to covid-19: Secondary | ICD-10-CM | POA: Diagnosis not present

## 2021-11-03 DIAGNOSIS — I10 Essential (primary) hypertension: Secondary | ICD-10-CM | POA: Insufficient documentation

## 2021-11-03 DIAGNOSIS — R4689 Other symptoms and signs involving appearance and behavior: Secondary | ICD-10-CM | POA: Diagnosis not present

## 2021-11-03 DIAGNOSIS — F3178 Bipolar disorder, in full remission, most recent episode mixed: Secondary | ICD-10-CM | POA: Insufficient documentation

## 2021-11-03 DIAGNOSIS — F22 Delusional disorders: Secondary | ICD-10-CM | POA: Insufficient documentation

## 2021-11-03 DIAGNOSIS — R4182 Altered mental status, unspecified: Secondary | ICD-10-CM | POA: Diagnosis present

## 2021-11-03 DIAGNOSIS — F3160 Bipolar disorder, current episode mixed, unspecified: Secondary | ICD-10-CM | POA: Diagnosis present

## 2021-11-03 DIAGNOSIS — F259 Schizoaffective disorder, unspecified: Secondary | ICD-10-CM | POA: Diagnosis present

## 2021-11-03 DIAGNOSIS — F250 Schizoaffective disorder, bipolar type: Secondary | ICD-10-CM | POA: Insufficient documentation

## 2021-11-03 DIAGNOSIS — F29 Unspecified psychosis not due to a substance or known physiological condition: Secondary | ICD-10-CM | POA: Diagnosis not present

## 2021-11-03 LAB — CBC
HCT: 37.5 % (ref 36.0–46.0)
Hemoglobin: 11.3 g/dL — ABNORMAL LOW (ref 12.0–15.0)
MCH: 25.2 pg — ABNORMAL LOW (ref 26.0–34.0)
MCHC: 30.1 g/dL (ref 30.0–36.0)
MCV: 83.7 fL (ref 80.0–100.0)
Platelets: 339 10*3/uL (ref 150–400)
RBC: 4.48 MIL/uL (ref 3.87–5.11)
RDW: 14.6 % (ref 11.5–15.5)
WBC: 8.3 10*3/uL (ref 4.0–10.5)
nRBC: 0 % (ref 0.0–0.2)

## 2021-11-03 LAB — URINALYSIS, ROUTINE W REFLEX MICROSCOPIC
Bilirubin Urine: NEGATIVE
Glucose, UA: NEGATIVE mg/dL
Hgb urine dipstick: NEGATIVE
Ketones, ur: 20 mg/dL — AB
Nitrite: NEGATIVE
Protein, ur: 30 mg/dL — AB
Specific Gravity, Urine: 1.027 (ref 1.005–1.030)
pH: 6 (ref 5.0–8.0)

## 2021-11-03 LAB — RAPID URINE DRUG SCREEN, HOSP PERFORMED
Amphetamines: NOT DETECTED
Barbiturates: NOT DETECTED
Benzodiazepines: NOT DETECTED
Cocaine: NOT DETECTED
Opiates: NOT DETECTED
Tetrahydrocannabinol: NOT DETECTED

## 2021-11-03 LAB — BASIC METABOLIC PANEL
Anion gap: 7 (ref 5–15)
BUN: 8 mg/dL (ref 6–20)
CO2: 25 mmol/L (ref 22–32)
Calcium: 8.7 mg/dL — ABNORMAL LOW (ref 8.9–10.3)
Chloride: 105 mmol/L (ref 98–111)
Creatinine, Ser: 0.59 mg/dL (ref 0.44–1.00)
GFR, Estimated: >60 mL/min (ref >60)
Glucose, Bld: 91 mg/dL (ref 70–99)
Potassium: 3.6 mmol/L (ref 3.5–5.1)
Sodium: 137 mmol/L (ref 135–145)

## 2021-11-03 LAB — I-STAT BETA HCG BLOOD, ED (MC, WL, AP ONLY): I-stat hCG, quantitative: <5 m[IU]/mL (ref <5)

## 2021-11-03 LAB — RESP PANEL BY RT-PCR (FLU A&B, COVID) ARPGX2
Influenza A by PCR: NEGATIVE
Influenza B by PCR: NEGATIVE
SARS Coronavirus 2 by RT PCR: NEGATIVE

## 2021-11-03 MED ORDER — DIPHENHYDRAMINE HCL 50 MG/ML IJ SOLN
50.0000 mg | Freq: Once | INTRAMUSCULAR | Status: AC
  Administered 2021-11-03: 50 mg via INTRAMUSCULAR
  Filled 2021-11-03: qty 1

## 2021-11-03 MED ORDER — LITHIUM CARBONATE 150 MG PO CAPS
300.0000 mg | ORAL_CAPSULE | Freq: Three times a day (TID) | ORAL | Status: DC
  Administered 2021-11-03 – 2021-11-09 (×18): 300 mg via ORAL
  Filled 2021-11-03 (×18): qty 2

## 2021-11-03 MED ORDER — LISINOPRIL 10 MG PO TABS: 20.0000 mg | ORAL_TABLET | Freq: Every day | ORAL | Status: DC

## 2021-11-03 MED ORDER — LEVETIRACETAM 250 MG PO TABS: 250.0000 mg | ORAL_TABLET | Freq: Two times a day (BID) | ORAL | Status: DC

## 2021-11-03 MED ORDER — GABAPENTIN 300 MG PO CAPS
600.0000 mg | ORAL_CAPSULE | Freq: Three times a day (TID) | ORAL | Status: DC
  Administered 2021-11-03 – 2021-11-10 (×23): 600 mg via ORAL
  Filled 2021-11-03 (×24): qty 2

## 2021-11-03 MED ORDER — ZIPRASIDONE HCL 20 MG PO CAPS
60.0000 mg | ORAL_CAPSULE | Freq: Two times a day (BID) | ORAL | Status: DC
  Administered 2021-11-03 – 2021-11-10 (×15): 60 mg via ORAL
  Filled 2021-11-03 (×15): qty 3

## 2021-11-03 MED ORDER — LISINOPRIL 10 MG PO TABS
20.0000 mg | ORAL_TABLET | Freq: Every day | ORAL | Status: DC
  Administered 2021-11-04 – 2021-11-10 (×7): 20 mg via ORAL
  Filled 2021-11-03 (×7): qty 2

## 2021-11-03 MED ORDER — ZIPRASIDONE HCL 20 MG PO CAPS: 60.0000 mg | ORAL_CAPSULE | Freq: Two times a day (BID) | ORAL | Status: DC

## 2021-11-03 MED ORDER — LEVETIRACETAM 250 MG PO TABS
250.0000 mg | ORAL_TABLET | Freq: Two times a day (BID) | ORAL | Status: DC
  Administered 2021-11-03 – 2021-11-10 (×15): 250 mg via ORAL
  Filled 2021-11-03 (×15): qty 1

## 2021-11-03 MED ORDER — QUETIAPINE FUMARATE 100 MG PO TABS
800.0000 mg | ORAL_TABLET | Freq: Every day | ORAL | Status: DC
  Administered 2021-11-03 – 2021-11-10 (×8): 800 mg via ORAL
  Filled 2021-11-03 (×6): qty 8

## 2021-11-03 MED ORDER — LORAZEPAM 2 MG/ML IJ SOLN
2.0000 mg | Freq: Once | INTRAMUSCULAR | Status: AC
  Administered 2021-11-03: 2 mg via INTRAMUSCULAR
  Filled 2021-11-03: qty 1

## 2021-11-03 NOTE — BH Assessment (Signed)
@  2235, per Jonelle Sidle from [facility], patient accepted for admission tomorrow morning after 9am. The accepting provider is Jonelle Sports, MD. Report [phone].

## 2021-11-03 NOTE — ED Notes (Signed)
I-stat Beta less than 5. Awaiting transmission.

## 2021-11-03 NOTE — BH Assessment (Signed)
Comprehensive Clinical Assessment (CCA) Note  11/03/2021 [name] [id number]  Disposition: Per Melissa Columbus, NP inpatient treatment is recommended.  [place] to review.  Disposition SW to pursue appropriate inpatient options.  The patient demonstrates the following risk factors for suicide: Chronic risk factors for suicide include: psychiatric disorder of Schizoaffective Disorder, bipolar type . Acute risk factors for suicide include: family or marital conflict and social withdrawal/isolation. Protective factors for this patient include: positive social support, positive therapeutic relationship, responsibility to others (children, family), and hope for the future. Considering these factors, the overall suicide risk at this point appears to be low. Patient is appropriate for outpatient follow up once stabilized.   Patient is a 46 year old female with a history of Bipolar I Disorder who presents voluntarily to [facility] for assessment.  Per EDP note, "Patient  is a 46 y.o. female presenting for worsening delusions, altered mental status.  She has an extensive medical history including  schizophrenia, OCD, CVA, hypertension, hyperlipidemia.  She comes in with her mother today as she has been having delusions that demons are invading her body.  She has been fixated and tearful.  They called her psychiatrist this morning who recommended she come in at this time.  She is currently voluntary."  Upon assessment, patient is quite pleasant as she attempts to engage in assessment.  She struggled a great deal due to apparent thought blocking and response to internal stimuli.  Patient observed often intently listening to "the voice of the devil."  She occasionally repeats what the voice tells her.  She states, "For the last 3 days, I've been stressed out and I'll tell ya why."  She states she has been seeing Satan all around her face when she closes her eyes.  She often sees faces all around her that appear angry and are  gritting their teeth.  She then states, "[name] is inside of me!"  She states he is speaking to her constantly, which has worsened for the past 3 weeks.  She gives the example that when she was asked her DOB at the beginning of the assessment, Satan told her he would kill her on her birthday.  She feels she may harm herself of others, stating she believes "he can make me do it.  He can make me hurt myself or someone else."  Patient reports she is followed by [name] and takes Rx Geodon, Gabapentin, Lithium and Seroquel.  She has had chronic pain issues and states she recently stopped pain meds abruptly and has been off for 3 wks now.  She denies other drug use.  She does admit to occasional ETOH use, last drink was 09/20/21.  Patient has past inpatient admissions, most recently to [facility] in February 2023.  Patient is unable to affirm her safety at this time, due to acute psychosis with command hallucinations.    Chief Complaint:  Chief Complaint  Patient presents with   Psychiatric Evaluation   Visit Diagnosis: Bipolar I Disorder vs Schizoaffective Disorder, bipolar type  Flowsheet Row ED from 11/03/2021 in [place] ED from 04/14/2021 in [place] Office Visit from 03/04/2021 in [place]  Thoughts that you would be better off dead, or of hurting yourself in some way Not at all Not at all Not at all  PHQ-9 Total Score -- 10 12      [place] ED from 11/03/2021 in [place] ED from 10/19/2021 in [place]  [facility] ED from 04/14/2021 in [place] No Risk No Risk No Risk       CCA Screening, Triage and Referral (STR)  Patient Reported Information How did you hear about [place]? Family/Friend  What Is the Reason for Your Visit/Call Today? Patient presented to [facility] due to concerns for worsening AH, stating she is hearing Satan constantly  taunting ern harrassing her.  She also believes Satan lives inside of her.  She reports he is telling her to harm others and to harm herself.  She is concerned [name] would be able to control her body and make her do these things.  Patient is actively responding to internal stimuli, often repeating what she hears [name] saying throughout assessment. She denies SI and HI, although feels she may act on AH.  She denies recent SA, stating she d/c pain meds on her own 3 wks ago and last drink was 09/20/21.  How Long Has This Been Causing You Problems? 1-6 months  What Do You Feel Would Help You the Most Today? Treatment for Depression or other mood problem; Medication(s)   Have You Recently Had Any Thoughts About Hurting Yourself? No  Are You Planning to Commit Suicide/Harm Yourself At This time? No   Have you Recently Had Thoughts About [place]? No  Are You Planning to Harm Someone at This Time? No  Explanation: No data recorded  Have You Used Any Alcohol or Drugs in the Past 24 Hours? No  How Long Ago Did You Use Drugs or Alcohol? No data recorded What Did You Use and How Much? No data recorded  Do You Currently Have a Therapist/Psychiatrist? Yes  Name of Therapist/Psychiatrist: Dr. Lendon [name] - psychiatrist   Have You Been Recently Discharged From Any Office Practice or Programs? No  Explanation of Discharge From Practice/Program: No data recorded    CCA Screening Triage Referral Assessment Type of Contact: Tele-Assessment  Telemedicine Service Delivery: Telemedicine service delivery: This service was provided via telemedicine using a 2-way, interactive audio and video technology  Is this Initial or Reassessment? Initial Assessment  Date Telepsych consult ordered in [facility]:  11/03/21  Time Telepsych consult ordered in [facility]:  1650  Location of Assessment: [facility]  Provider Location: [facility]   Collateral Involvement: None currently   Does  Patient Have a [place]? No data recorded Name and Contact of Legal Guardian: No data recorded If Minor and Not Living with Parent(s), Who has Custody? N/A  Is CPS involved or ever been involved? -- (UTA)  Is APS involved or ever been involved? Never   Patient Determined To Be At Risk for Harm To Self or Others Based on Review of Patient Reported Information or Presenting Complaint? Yes, for Self-Harm  Method: No data recorded Availability of Means: No data recorded Intent: No data recorded Notification Required: No data recorded Additional Information for Danger to Others Potential: No data recorded Additional Comments for Danger to Others Potential: No data recorded Are There Guns or Other Weapons in Your Home? No data recorded Types of Guns/Weapons: No data recorded Are These Weapons Safely Secured?                            No data recorded Who Could Verify You Are Able To Have These Secured: No data recorded Do You Have any Outstanding Charges, Pending Court Dates, Parole/Probation? No data recorded  Contacted To Inform of Risk of Harm To Self or Others: Family/Significant Other:; Other: Comment (psychiatrist, [name])    Does Patient Present under Involuntary Commitment? No  IVC Papers Initial File Date: 04/14/21   [place] of Residence: [place]   Patient Currently Receiving the Following Services: Medication Management   Determination of Need: Emergent (2 hours)   Options For Referral: Inpatient Hospitalization     CCA Biopsychosocial Patient Reported Schizophrenia/Schizoaffective Diagnosis in Past: No   Strengths: Pt is able to identify her thoughts, feelings, and concerns.  She has support and is engaged in outpt services.   Mental Health Symptoms Depression:   Hopelessness; Change in energy/activity; Fatigue; Irritability   Duration of Depressive symptoms:  Duration of Depressive Symptoms: Greater than two weeks   Mania:    Recklessness; Irritability; Racing thoughts; Overconfidence   Anxiety:    Worrying; Tension   Psychosis:   Delusions; Hallucinations   Duration of Psychotic symptoms:  Duration of Psychotic Symptoms: Less than six months   Trauma:   None   Obsessions:   None   Compulsions:   None   Inattention:   N/A   Hyperactivity/Impulsivity:   N/A   Oppositional/Defiant Behaviors:   N/A   Emotional Irregularity:   Mood lability; Potentially harmful impulsivity; Intense/inappropriate anger   Other Mood/Personality Symptoms:   None noted    Mental Status Exam Appearance and self-care  Stature:   Average   Weight:   Overweight   Clothing:   -- (Pt is dressed in scrubs)   Grooming:   Normal   Cosmetic use:   None   Posture/gait:   Normal   Motor activity:   Not Remarkable   Sensorium  Attention:   Distractible   Concentration:   Focuses on irrelevancies   Orientation:   Object; Person; Place; Time (UTA)   Recall/memory:   Normal   Affect and Mood  Affect:   Full Range   Mood:   Anxious   Relating  Eye contact:   Normal   Facial expression:   Responsive   Attitude toward examiner:   Cooperative   Thought and Language  Speech flow:  Flight of Ideas; Paucity   Thought content:   Appropriate to Mood and Circumstances; Delusions   Preoccupation:   None   Hallucinations:   Auditory; Visual; Tactile   Organization:  No data recorded  [employer] of Knowledge:   Average   Intelligence:   Average   Abstraction:   Normal   Judgement:   Impaired   Reality Testing:   Variable   Insight:   Lacking   Decision Making:   Impulsive   Social Functioning  Social Maturity:   Irresponsible   Social Judgement:   Heedless   Stress  Stressors:   Illness; Transitions; Family conflict   Coping Ability:   Exhausted   Skill Deficits:   Decision making; Self-control; Self-care; Communication   Supports:    Friends/Service system; Family     Religion: Religion/Spirituality Are You A Religious Person?: No (UTA) How Might This Affect Treatment?: N/A  Leisure/Recreation: Leisure / Recreation Do You Have Hobbies?: No (UTA)  Exercise/Diet: Exercise/Diet Do You Exercise?: No (UTA) Have You Gained or Lost A Significant Amount of Weight in the Past Six Months?: No (UTA) Do You Follow a Special Diet?: No (UTA) Do You Have Any Trouble Sleeping?: Yes (UTA) Explanation of Sleeping Difficulties: sleeps 5-5.5 hours per night - difficulty getting to sleep d/t hallucinations  CCA Employment/Education Employment/Work Situation: Employment / Work Technical sales engineer: On disability Why is Patient on Disability: Medical and physical complications How Long has Patient Been on Disability: 13 years Patient's Job has Been Impacted by Current Illness:  (N/A) Has Patient ever Been in the [employer]?: No  Education: Education Is Patient Currently Attending School?: No Last Grade Completed: 12 (UTA) Did You Attend College?: No (UTA) Did You Have An Individualized Education Program (IIEP): No (UTA) Did You Have Any Difficulty At School?: No (UTA) Patient's Education Has Been Impacted by Current Illness: No   CCA Family/Childhood History Family and Relationship History: Family history Marital status: Single Does patient have children?: Yes How many children?: 2 How is patient's relationship with their children?: Pt shares she has a 83 year-old son and an adult daughter whom has 2 grandchildren that she loves very much  Childhood History:  Childhood History By whom was/is the patient raised?: Mother (UTA) Did patient suffer any verbal/emotional/physical/sexual abuse as a child?:  (UTA due to AMS, active hallucinations) Did patient suffer from severe childhood neglect?: No Has patient ever been sexually abused/assaulted/raped as an adolescent or adult?:  (UTA due to [place], active  hallucinations) Was the patient ever a victim of a crime or a disaster?:  (UTA due to AMS, active hallucinations) Witnessed domestic violence?:  (UTA) Has patient been affected by domestic violence as an adult?:  (UTA due to AMS, active hallucinations)  Child/Adolescent Assessment:  N/A  CCA Substance Use Alcohol/Drug Use: Alcohol / Drug Use Pain Medications: Please see MAR Prescriptions: Please see MAR Over the Counter: Please see MAR History of alcohol / drug use?: No history of alcohol / drug abuse Longest period of sobriety (when/how long): Hx of being Rx pain meds - took self off 3 wks ago - occasional ETOH use      ASAM's:  Six Dimensions of Multidimensional Assessment  Dimension 1:  Acute Intoxication and/or Withdrawal Potential:      Dimension 2:  Biomedical Conditions and Complications:      Dimension 3:  Emotional, Behavioral, or Cognitive Conditions and Complications:     Dimension 4:  Readiness to Change:     Dimension 5:  Relapse, Continued use, or Continued Problem Potential:     Dimension 6:  Recovery/Living Environment:     ASAM Severity Score:    ASAM Recommended Level of Treatment:     Substance use Disorder (SUD)    Recommendations for Services/Supports/Treatments:    Discharge Disposition:    DSM5 Diagnoses: Patient Active Problem List   Diagnosis Date Noted   Chronic pain syndrome 03/04/2021   Hypertension 03/04/2021   Lumbar radiculopathy 01/20/2021   OSA (obstructive sleep apnea) 10/04/2020   PONV (postoperative nausea and vomiting) 10/04/2020   Hx of hiatal hernia 09/13/2020   Sedative, hypnotic or anxiolytic use disorder, severe, in controlled environment ([place]) 09/11/2020   Lumbar spondylosis 08/29/2020   Pain 03/08/2020   Iron deficiency anemia due to chronic blood loss 11/09/2019   Sarcoidosis 04/26/2019   Abnormal CT of the chest 03/23/2019   Hypokalemia 02/24/2019   History of CVA (cerebrovascular accident) 11/16/2018   CVA  (cerebrovascular accident) [facility])    Degeneration of lumbar intervertebral disc 12/13/2014   Anemia associated with nutritional deficiency 11/12/2014   Bipolar affective disorder ([place]) 09/28/2014   S/P laparoscopic sleeve gastrectomy 01/2014 09/28/2014   Abdominal wall seroma - chronic    Morbid obesity with BMI of 40.0-44.9, adult ([place]) 05/11/2013   Cholelithiases 04/25/2013   Smoker  09/19/2011   GERD 03/04/2010     Referrals to Alternative Service(s): Referred to Alternative Service(s):   Place:   Date:   Time:    Referred to Alternative Service(s):   Place:   Date:   Time:    Referred to Alternative Service(s):   Place:   Date:   Time:    Referred to Alternative Service(s):   Place:   Date:   Time:     Fransico Meadow, [facility]

## 2021-11-03 NOTE — ED Provider Notes (Signed)
[facility] EMERGENCY DEPARTMENT Provider Note   CSN: [id number] Arrival date & time: 11/03/21  0947     History Chief Complaint  Patient presents with   Psychiatric Evaluation    HPI [name] is a 46 y.o. female presenting for worsening delusions, altered mental status.  She has an extensive medical history including  schizophrenia, OCD, CVA, hypertension, hyperlipidemia.  She comes in with her mother today as she has been having delusions that demons are invading her body.  She has been fixated and tearful.  They called her psychiatrist this morning who recommended she come in at this time.  She is currently voluntary.  Patient's recorded medical, surgical, social, medication list and allergies were reviewed in the Snapshot window as part of the initial history.   Review of Systems   Review of Systems  Constitutional:  Negative for chills and fever.  HENT:  Negative for ear pain and sore throat.   Eyes:  Negative for pain and visual disturbance.  Respiratory:  Negative for cough and shortness of breath.   Cardiovascular:  Negative for chest pain and palpitations.  Gastrointestinal:  Negative for abdominal pain and vomiting.  Genitourinary:  Negative for dysuria and hematuria.  Musculoskeletal:  Negative for arthralgias and back pain.  Skin:  Negative for color change and rash.  Neurological:  Negative for seizures and syncope.  All other systems reviewed and are negative.   Physical Exam Updated Vital Signs BP (!) 158/67   Pulse (!) 103   Temp 97.8 F (36.6 C) (Oral)   Resp 18   Ht '5\' 6"'$  (1.676 m)   Wt 131 kg   SpO2 97%   BMI 46.61 kg/m  Physical Exam Vitals and nursing note reviewed.  Constitutional:      General: She is not in acute distress.    Appearance: She is well-developed.  HENT:     Head: Normocephalic and atraumatic.  Eyes:     Conjunctiva/sclera: Conjunctivae normal.  Cardiovascular:     Rate and Rhythm: Normal rate and regular rhythm.      Heart sounds: No murmur heard. Pulmonary:     Effort: Pulmonary effort is normal. No respiratory distress.     Breath sounds: Normal breath sounds.  Abdominal:     General: There is no distension.     Palpations: Abdomen is soft.     Tenderness: There is no abdominal tenderness. There is no right CVA tenderness or left CVA tenderness.  Musculoskeletal:        General: No swelling or tenderness. Normal range of motion.     Cervical back: Neck supple.  Skin:    General: Skin is warm and dry.     Findings: Lesion (Chronic wound to patient's abdomen.  Appears clean dry and intact at this time.) present.  Neurological:     General: No focal deficit present.     Mental Status: She is alert and oriented to person, place, and time. Mental status is at baseline.     Cranial Nerves: No cranial nerve deficit.      ED Course/ Medical Decision Making/ A&P Clinical Course as of 11/03/21 1539  Mon Nov 03, 2021  1017 Needs Medical clearance [CC]  1238 Now medically cleared. TTS ordered [CC]    Clinical Course User Index [CC] Tretha Sciara, MD    Procedures Procedures   Medications Ordered in ED Medications  gabapentin (NEURONTIN) tablet 600 mg (has no administration in time range)  levETIRAcetam (KEPPRA) tablet 250  mg (has no administration in time range)  lisinopril (ZESTRIL) tablet 20 mg (has no administration in time range)  lithium carbonate capsule 300 mg (has no administration in time range)  QUEtiapine (SEROQUEL) tablet 800 mg (has no administration in time range)  ziprasidone (GEODON) capsule 60 mg (has no administration in time range)    Medical Decision Making:    DERRISHA FOOS is a 46 y.o. female who presented to the ED today with psychiatric distress detailed above.     Patient's presentation is complicated by their history of multiple comorbid psychiatric disorders as well as chronic medical conditions including hypertension, obesity, complex outpatient medication  regimen.  Patient placed on continuous vitals and telemetry monitoring while in ED which was reviewed periodically.   Complete initial physical exam performed, notably the patient  was hemodynamically stable in no acute distress.  She appears to be responding to internal stimuli.      Reviewed and confirmed nursing documentation for past medical history, family history, social history.    Initial Assessment:   Patient's history of present illness and physical exam findings are most consistent with decompensated schizophrenia given her internal stimuli response, crying out for demons. Patient is here with her mother.  They state that they talk to their psychiatrist earlier in the day who recommended she come to be evaluated by an inpatient psychiatrist. Medical clearance was initiated with screening lab work, EKG, vital sign monitoring all of which resulted reassuringly.  Patient was medically cleared for psychiatric evaluation and TTS consultation. Patient is currently voluntary though based on prior psychiatric history would likely require IVC if her insight decreases and she is attempting to leave without psychiatric evaluation.  Clinical Impression:  1. Aggressive behavior   2. Psychosis, unspecified psychosis type ([place])      Data Unavailable   Final Clinical Impression(s) / ED Diagnoses Final diagnoses:  Aggressive behavior  Psychosis, unspecified psychosis type [facility])    Rx / DC Orders ED Discharge Orders     None         Tretha Sciara, MD 11/03/21 1539

## 2021-11-03 NOTE — ED Notes (Signed)
Pt remains calm and cooperative at this time.

## 2021-11-03 NOTE — Progress Notes (Signed)
Inpatient Behavioral Health Placement  Pt meets inpatient criteria per Garrison Columbus, NP. There are no available beds at [facility] per [facility]. Referral sent to the following facilities;   Destination Service Provider Address Phone Fax  [facility]  [address], [place] [address] [phone] [place]  [place], [place] [address] [phone]  [facility]  [address] [place] [address] [place]  [facility]  [address] [place] [address] [phone]  [place] [address], [place] [address] [phone]  [facility]  [address] [place] [address] [phone] [place]  [address], [place] [address] [phone] [facility]  [address], [place] [address] [phone] [facility]  [address] [place] [address] [place]  [address] [place] [address] [phone] [facility]  [address] [place] [address] [phone] [facility]  [address], [place] [address] [phone] [facility]  [place], [name] [address] [phone]  [facility]  [address] [place] [address] [phone]   Situation ongoing,  CSW will follow up.   [name], MSW, LCSWA 11/03/2021  @ 6:20 PM

## 2021-11-03 NOTE — ED Provider Notes (Signed)
Patient increasingly agitated, screaming about demons.  This is despite 60 mg of oral Geodon as well as her evening Seroquel being given.  She is not redirectable by staff.  I have ordered 2 mg IM Ativan as well as 50 mg of IM Benadryl.   Melissa Dusky, MD 11/03/21 [phone]

## 2021-11-03 NOTE — ED Notes (Signed)
Wound to abdomen reinforced with abdominal pads. Packing noted to be present.

## 2021-11-03 NOTE — ED Notes (Signed)
Wanded by security. Personal belongings removed and placed in storage room.

## 2021-11-03 NOTE — ED Triage Notes (Signed)
Patient to ED with mother after she reports two days of hallucinations and delusions. Referred here for eval after seeing psych this am. Patient states that she has "hole in stomach that is draining urine and feces from Geodon use." States when she closes her eyes "a sexual demon talks to me until I can take my Seroquel at night." Reports this demon tells her to hurt herself and others buy cutting self or taking too many pills. Mom states that she has been crying all weekend. Stopped pain meds in August and mom is asking that we not give any while in ED. Reports generalized pain which is chronic. Reports compliance with meds. Currently denies SI/HI. MD in room at time of triage for MSE. Patient changed into scrubs.

## 2021-11-03 NOTE — ED Notes (Signed)
Patient refusing lunch at this time.

## 2021-11-04 DIAGNOSIS — F3178 Bipolar disorder, in full remission, most recent episode mixed: Secondary | ICD-10-CM | POA: Diagnosis not present

## 2021-11-04 NOTE — ED Notes (Signed)
Abdominal wound midline of abdomen that measures 1.5 x 5.5 cm, depth of wound 1.5 cm, wound appears pink in color with minimal drainage. No signs or symptoms of infection of wound. Pt states she normally cleans and dresses the wound herself. Wound is currently packed with idoform packing strip and abdominal pad on top of wound. Pt has had wound for approximately 1 year and four months

## 2021-11-04 NOTE — ED Notes (Signed)
According to pt, mid-abdominal wound has been present over a year. Upon reviewing pt's medical Hx, this appears to be consistent with pt statement. Wound appears deep with minimal drainage. Wound packed and abdominal pads placed over packing. Pt states she normally cares for her abdominal wound, as this is a chronic wound.

## 2021-11-04 NOTE — ED Notes (Signed)
Vol inpt tx

## 2021-11-04 NOTE — ED Notes (Signed)
Per [facility], the facility is unable to take pt, whom is currently voluntarily, if pt is having current delusions and hallucinations, then pt must be IVC before admission to [facility]. Upon assessment this morning, pt states "I am still hearing things constantly in my head, even when I close my eyes" When this RN asked what pt is specifically hearing, pt states "I am hearing satan and he is saying that Vernie Shanks wants to F me and use a long wood paddle" she also states that "satan said that he wants to take her for her birthday so that him and Vernie Shanks can have her and pt states "satan told her to scratch her head hard just like Ricky knowles did". MD made aware

## 2021-11-04 NOTE — ED Provider Notes (Signed)
Emergency Medicine Observation Re-evaluation Note  [name] is a 46 y.o. female, seen on rounds today.  Pt initially presented to the ED for complaints of Psychiatric Evaluation Currently, the patient is sleeping soundly.  Physical Exam  BP (!) 146/98 (BP Location: Left Arm)   Pulse 98   Temp 97.9 F (36.6 C)   Resp 18   Ht '5\' 6"'$  (1.676 m)   Wt 131 kg   SpO2 95%   BMI 46.61 kg/m  Physical Exam General: Sleeping soundly Cardiac: Well-perfused Lungs: No increased work of breathing Psych: Calm, resting  ED Course / MDM  EKG:EKG Interpretation  Date/Time:  Monday November 03 2021 10:56:46 EDT Ventricular Rate:  93 PR Interval:  138 QRS Duration: 74 QT Interval:  378 QTC Calculation: 469 R Axis:   108 Text Interpretation: Normal sinus rhythm Rightward axis Low voltage QRS Nonspecific ST and T wave abnormality Abnormal ECG When compared with ECG of 03-Nov-2021 10:55, Nonspecific T wave abnormality now evident in Inferior leads Confirmed by Tretha Sciara [phone]) on 11/03/2021 12:38:36 PM  I have reviewed the labs performed to date as well as medications administered while in observation.  Recent changes in the last 24 hours include patient agitated and screaming about demons last night despite 60 mg p.o. Geodon and evening Seroquel.  Received 2 mg IM lorazepam and 50 mg IM diphenhydramine.   Plan  Current plan is for admission at [facility] this morning.    Audley Hose, MD 11/04/21 [phone]

## 2021-11-04 NOTE — ED Notes (Signed)
Pt refused meals yesterday and has refused food and water this am

## 2021-11-04 NOTE — ED Notes (Signed)
Pt stated she is seeing big grey skulls on the walls and the devil is in her head talking to her.

## 2021-11-04 NOTE — ED Notes (Signed)
Per [name], LCSW, pt is under review for [facility] pending a wound care consult

## 2021-11-04 NOTE — ED Notes (Signed)
Pt refused meal tray. Pt stated the devil told her staff is putting poison in her food and drink. Nurse notified.

## 2021-11-04 NOTE — ED Notes (Addendum)
According to [facility] facility, facility is unable to accept pt for admission due to pt's chronic abdominal wound as it requires packing and frequent cleansing-- LCSW made aware

## 2021-11-04 NOTE — ED Notes (Signed)
Pt very agitated getting progressively louder, with flights of ideas, speaking fast and stating that she wants an exorcism not medicine. "I need the demon out of me you all need to pray it out of me and I will be okay"
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
## 2021-11-04 NOTE — ED Notes (Signed)
Pt stating that she is refusing to eat because satan gets into her body through the food because it is laced with Geodon

## 2021-11-04 NOTE — ED Notes (Signed)
Pt received lunch tray 

## 2021-11-04 NOTE — Progress Notes (Signed)
Pt is under review at [facility] and at this time they are requesting a wound consult. CSW communicated request to Idelle Crouch, RN who communicated understanding of request.  CSW sent referral to [facility] and per intake RN they are requesting more information about pt's wound via documentation. What is the wound being packed with, timeframe of wound and measurements of wound. CSW communicated with nursing Idelle Crouch, RN.   [name], MSW, LCSWA 11/04/2021 4:55 PM

## 2021-11-04 NOTE — ED Notes (Signed)
Pt cleaned self up and given new scrubs. Pt resting in room now.
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
## 2021-11-05 DIAGNOSIS — F3178 Bipolar disorder, in full remission, most recent episode mixed: Secondary | ICD-10-CM | POA: Diagnosis not present

## 2021-11-05 DIAGNOSIS — R4689 Other symptoms and signs involving appearance and behavior: Secondary | ICD-10-CM

## 2021-11-05 MED ORDER — LOPERAMIDE HCL 2 MG PO CAPS
4.0000 mg | ORAL_CAPSULE | Freq: Once | ORAL | Status: AC
  Administered 2021-11-05: 4 mg via ORAL
  Filled 2021-11-05: qty 2

## 2021-11-05 MED ORDER — ACETAMINOPHEN 325 MG PO TABS
650.0000 mg | ORAL_TABLET | Freq: Four times a day (QID) | ORAL | Status: DC | PRN
  Administered 2021-11-05 – 2021-11-10 (×4): 650 mg via ORAL
  Filled 2021-11-05 (×5): qty 2

## 2021-11-05 NOTE — ED Notes (Signed)
Spoke to Pt's mother w/ Pt's verbal permission.  Pt requesting mother to bring underwear x2 and a sports bra.  Mother reports she will bring them and leave them at Registration.
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
## 2021-11-05 NOTE — ED Notes (Addendum)
Pt remains cooperative.  Pt has eaten some crackers and peanut butter this afternoon.   [facility] made aware Wound consult/assessment has been entered.

## 2021-11-05 NOTE — ED Notes (Addendum)
Pt's previous belongings and belongings requested from Mother placed in locker.

## 2021-11-05 NOTE — ED Notes (Signed)
Per Sitter, Pt continues to refuse meals.  Also, Pt noted to be screaming out.  Sts "God is spanking her."    Pt offered juice by this writer and Pt sts "I don't know."  Pt has delayed responses and movements.

## 2021-11-05 NOTE — ED Notes (Signed)
Pt noted to ambulate w/o difficulty.

## 2021-11-05 NOTE — ED Notes (Signed)
Pt reports increased anxiety and restless.  Informed we can give lithium and gabapentin in 77mns.  Pt reports she can wait for those medications.

## 2021-11-05 NOTE — ED Notes (Signed)
Wound care performed to abdomen, one roll of bulkee dressing wet with normal saline, wound packed using q-tip while applying, abd pad applied and secured with tape, pt tolerated well, no discomfort reported or noted

## 2021-11-05 NOTE — ED Notes (Signed)
Pt refused breakfast 

## 2021-11-05 NOTE — Progress Notes (Signed)
Patient has been denied by [facility] due to no appropriate beds available. Patient meets [place] inpatient criteria per Garrison Columbus, NP. Patient has been faxed out to the following facilities:   [facility] [address] [place] [address] [phone]  [place], [place] [address] [phone]  [facility]  [address] [place] [address] [facility]  [facility]  [address] [place] [address] [phone]  [place] [address] [place] [address] [phone]  [facility]  [address], [place] [address] [phone] [place]  [address], [place] [address] [phone] [facility]  [address], [place]  [address] [phone] [facility]  [address] [place] [address] [place]  [address], [place] [address] [phone] [facility]  [address], [place] [address] [phone] [facility]  [address], [place] [address] [phone] [facility]  [place], [place] [address] [phone]  [facility]  [address] [place] [address] [phone]   Mariea Clonts, MSW, LCSW-A  9:00 PM 11/05/2021

## 2021-11-05 NOTE — ED Provider Notes (Signed)
Emergency Medicine Observation Re-evaluation Note  BRYNDLE CORREDOR is a 46 y.o. female, seen on rounds today.  Pt initially presented to the ED for complaints of Psychiatric Evaluation Currently, the patient is sleeping soundly.  Physical Exam  BP 123/74 (BP Location: Left Arm)   Pulse 98   Temp 97.6 F (36.4 C) (Oral)   Resp 18   Ht '5\' 6"'$  (1.676 m)   Wt 131 kg   SpO2 98%   BMI 46.61 kg/m  Physical Exam General: Sleeping soundly Cardiac: Well-perfused Lungs: No increased work of breathing Psych: Calm, resting  ED Course / MDM  EKG:EKG Interpretation  Date/Time:  Monday November 03 2021 10:56:46 EDT Ventricular Rate:  93 PR Interval:  138 QRS Duration: 74 QT Interval:  378 QTC Calculation: 469 R Axis:   108 Text Interpretation: Normal sinus rhythm Rightward axis Low voltage QRS Nonspecific ST and T wave abnormality Abnormal ECG When compared with ECG of 03-Nov-2021 10:55, Nonspecific T wave abnormality now evident in Inferior leads Confirmed by Tretha Sciara [phone]) on 11/03/2021 12:38:36 PM  I have reviewed the labs performed to date as well as medications administered while in observation.  Recent changes in the last 24 hours include that [facility] stated they are unable to take patient having active delusions/hallucinations if she is not IVC'd. Patient was IVC'd. Subsequently [facility] stated they are unable to take a patient with chronic abdominal wound requiring packing/cleansing. Patient having delusions/hallucinations of devil/satan last evening.   Plan  Current plan is pending placement at facilities, wound care consult.    Audley Hose, MD    Audley Hose, MD 11/05/21 [phone]

## 2021-11-05 NOTE — Progress Notes (Signed)
Pt has been denied at [facility], [facility], [facility] , and [facility]. CSW is still seeking placement.   [name], MSW, [facility] 11/08/2021 12:34 AM

## 2021-11-05 NOTE — Consult Note (Addendum)
[facility] Nurse Consult Note: Consult requested for abd wound.  Performed remotely after review of progress notes and office notes from [facility] outpatient wound clinic.  Pt is familiar to [facility] team from previous admission on 1/14. She has a chronic full thickness wound to her abd which has been present since 2015, after surgery was performed for a seroma. Pt was able to perform her own dressing changes prior to this admission.  She was recently seen at the outpatient wound clinic listed above on 8/22 for a consult to plastic surgery for a possible skin substitute application for closure. This would require another surgery and patient declined this plan of care.  Refer to bedside nursing progress notes on 9/12: wound is red and moist, 1.5X5.5X1.5cm. Description of the wound appearance in the notes is unchanged since the previous admission. She has been told by her surgical team in the past that this wound may never heal.   Dressing procedure/placement/frequency: Continue present plan of care as ordered by the outpatient wound care center: Apply saline-moistened gauze packing to abd wound Q day, using swab to fill, then cover with ABD pad and tape Please re-consult if further assistance is needed.  Thank-you,  Julien Girt MSN, [place], [place], [place], [place]

## 2021-11-06 DIAGNOSIS — F3178 Bipolar disorder, in full remission, most recent episode mixed: Secondary | ICD-10-CM | POA: Diagnosis not present

## 2021-11-06 MED ORDER — VALACYCLOVIR HCL 500 MG PO TABS
500.0000 mg | ORAL_TABLET | Freq: Once | ORAL | Status: AC
  Administered 2021-11-06: 500 mg via ORAL
  Filled 2021-11-06: qty 1

## 2021-11-06 NOTE — Progress Notes (Signed)
TTS at bedside. 

## 2021-11-06 NOTE — ED Notes (Signed)
Pt received lunch tray 

## 2021-11-06 NOTE — ED Notes (Signed)
Pt received dinner tray.

## 2021-11-06 NOTE — ED Notes (Signed)
Pt. Making phone call

## 2021-11-06 NOTE — ED Notes (Signed)
Pt sitting up in chair watching TV. No complaints at this time

## 2021-11-06 NOTE — ED Notes (Signed)
Pt's mother called wanting an update; informed her pt is still waiting placement and pt has had a good day so far

## 2021-11-06 NOTE — ED Notes (Signed)
Pt sitting up in chair eating breakfast

## 2021-11-06 NOTE — ED Notes (Signed)
Wound packed with saline soaked kerlix using cotton tip applictor, ABD pad and medipore tape cover wound; pt tolerated procedure  Pt has loud snoring respirations

## 2021-11-06 NOTE — ED Notes (Signed)
Pt aware that she is awaiting inpatient placement. She voices concern about needing new medication added to her regimen to "get rid of the devil in her ear". She reports she would "just like to get better". Bryson Corona Edd Fabian

## 2021-11-06 NOTE — ED Notes (Signed)
Pt wanted snack, sitter gave pt sandwich bag.

## 2021-11-06 NOTE — ED Provider Notes (Signed)
Rachael Darby NP states that she is not psych cleared. She continues to need inpatient psychiatric placement. Clinical social worker continues to pursue appropriate inpatient placement options for patient.   Fransico Meadow, MD 11/06/21 [phone]

## 2021-11-06 NOTE — ED Notes (Signed)
Pt took shower °

## 2021-11-06 NOTE — ED Notes (Addendum)
Per FNP Garrison Columbus "[name] is not psych cleared.  She continues to need inpatient psychiatric placement.  Clinical social worker continues to pursue appropriate inpatient placement options for patient". EDP Philip Aspen was added to secure chat conversation from  [place]. Bryson Corona Edd Fabian

## 2021-11-06 NOTE — Consult Note (Signed)
Telepsych Consultation   Reason for Consult: Decompensated schizophrenia Referring Physician: Dr. Mayra Neer Location of Patient: [facility], ED hospital Location of Provider: [facility]  Patient Identification: [name] MRN:  [id number] Principal Diagnosis: <principal problem not specified> Diagnosis:  Active Problems:   * No active hospital problems. *   Total Time spent with patient: 1 hour  Subjective:   [name] is a 46 y.o. female patient.  HPI:   Patient is a 46 year old female with a history of Bipolar I Disorder who presents voluntarily to [facility] for assessment.  Per EDP note, "Patient  is a 46 y.o. female presenting for worsening delusions, altered mental status.  She has an extensive medical history including  schizophrenia, OCD, CVA, hypertension, hyperlipidemia.  She comes in with her mother today as she has been having delusions that demons are invading her body.  She has been fixated and tearful.  They called her psychiatrist this morning who recommended she come in at this time.  She is currently voluntary."  Assessment: On assessment today via telepsych, patient sitting on her bed at [facility], ED hospital.  Appears calm and complaining of back pain, instructed to call for as needed pain medication.  Chart reviewed and findings shared with the treatment team and discussed with Dr. Randie Heinz via this note.  Patient appears pleasant while participating in the assessment.  Experiencing some difficulty due to thought blocking and constantly hearing the demons talking negatively to her.  When asked if she has suicidal thought today, patient responded, "no."  However, patient added the devil is talking to me and telling me, "I am going to hell because I am suicidal."  When questioned about homicidal thoughts, patient reports that the devil is talking to her left ear and telling her she is going to kill someone. With AVH, patient reports, "I am seeing them in my  face telling me no one likes me."   Patient states she constantly hears the demons talking to her and commanding her to do some bad stuff and continuous  sexual demands.  Abdominal wound treatment continues and wound consult completed yesterday, see consult notes.   Patient reports sleeping 6 hours last night and was awakened 4 times by the demons for sexual demands.  Endorses good appetite, being safe at home, however, afraid that she may be killed on her birthday on December 05, 2020.  Denies access to firearms, denies self-injurious behavior, denies drug use, tobacco smoking, or marijuana use. She does admit to occasional ETOH use, last drink was 09/20/21.  Urine drug screen on 11/03/2021, negative for substance use. Reports being followed by a therapist and a psychiatrist named Dr. Erling Cruz, who also manages her medications which includes Geodon, gabapentin, lithium, and Seroquel. Patient reports she has had chronic pain issues and states she recently stopped pain meds abruptly and has been off for 3 wks now.  She denies other drug use.  Patient has past inpatient admissions, most recently to [facility] in February 2023.  Patient is unable to affirm her safety at this time, due to acute psychosis with command hallucinations.   Disposition: Based on my examination of patient, she is not psych cleared.  She continues to need inpatient psychiatric admission.  Social work continues to pursue appropriate inpatient placement options.  Independent ED treatment team and [facility], ED physician made aware of the patient's disposition.  Past Psychiatric History:   Risk to Self: Yes  Risk to Others:  Yes Prior Inpatient Therapy:  Yes Prior Outpatient Therapy: Yes   Past Medical History: Bipolar affective disorder, history of ADHD, history of anxiety, history of depression, substance abuse history, schizophrenia history of OCD.   Past Medical History:  Diagnosis Date   ADHD    Anal fistula    Anxiety     Arthritis    pt denies 5/3 visit    Bipolar affective ([place])    Blood transfusion without reported diagnosis    Colostomy in place [facility])    CVA (cerebrovascular accident) ([place])    left cerebellar infarct found accidentally on CT (2020)    Depression    Diverticulitis 2008   perforated/ requiring resection   Fatty liver    Gallstones    GERD (gastroesophageal reflux disease)    occ.   Headache    after a fall   Herpes simplex    History of colon polyps    History of hiatal hernia    small   History of kidney stones    cystoscopy  basket removal   Hypertension    Iron deficiency anemia    Obese    OCD (obsessive compulsive disorder)    Panic attacks    PONV (postoperative nausea and vomiting) 10/04/2020   Pulmonary nodules    Sarcoidosis    Schizophrenia ([place])    Shortness of breath dyspnea    pt denies on 5/3 visit    Sleep apnea    mild, does not use c-pap machine   Substance abuse ([place])    12 years ago-crack cocaine    Past Surgical History:  Procedure Laterality Date   Abdominal wall hernia  07/2007   open repair with lysis of adhesions   APPLICATION OF WOUND VAC N/A 06/29/2019   Procedure: PLACEMENT OF WOUND VAC;  Surgeon: Greer Pickerel, MD;  Location: [facility];  Service: General;  Laterality: N/A;   BRONCHIAL BRUSHINGS  04/11/2019   Procedure: BRONCHIAL BRUSHINGS;  Surgeon: Collene Gobble, MD;  Location: [facility] ENDOSCOPY;  Service: Pulmonary;;   BRONCHIAL WASHINGS  04/11/2019   Procedure: BRONCHIAL WASHINGS;  Surgeon: Collene Gobble, MD;  Location: [facility] ENDOSCOPY;  Service: Pulmonary;;   COLONOSCOPY  03/13/2010   NUU:[id number] hemorrhoids likely cause of hematochezia, otherwise normal   COLONOSCOPY  05/24/2019   COLONOSCOPY WITH ESOPHAGOGASTRODUODENOSCOPY (EGD)  04/2019   Colostomy reversal  11/2006   DEBRIDEMENT OF ABDOMINAL WALL ABSCESS N/A 06/29/2019   Procedure: EXCISIONAL DEBRIDEMENT OF ABDOMINAL WALL/SUBCU SEROMA;  Surgeon: Greer Pickerel, MD;  Location: [facility];  Service:  General;  Laterality: N/A;   ESOPHAGOGASTRODUODENOSCOPY  03/13/2010   IHK:[id number] esophagus, status post passage of a [place]oney dilator/Small hiatal hernia/ Antral erosions, status post biopsy   EXAMINATION UNDER ANESTHESIA N/A 05/11/2012   Procedure: EXAM UNDER ANESTHESIA;  Surgeon: Donato Heinz, MD;  Location: [facility];  Service: General;  Laterality: N/A;   Exploratory laparotomy with resection  2008   colonoscopy   FINE NEEDLE ASPIRATION  04/11/2019   Procedure: FINE NEEDLE ASPIRATION (FNA) LINEAR;  Surgeon: Collene Gobble, MD;  Location: [facility]COPY;  Service: Pulmonary;;   FINGER CLOSED REDUCTION Right 08/05/2012   Procedure: CLOSED REDUCTION RIGHT THUMB (FINGER);  Surgeon: Linna Hoff, MD;  Location: [place];  Service: Orthopedics;  Laterality: Right;   HERNIA REPAIR     incisional hernia surgery    Hx of abd wall seroma  08/2007   Drained via [place] in [place], [place]   Hx of abd wall seroma  10/2007   Drained by Dr. Geroge Baseman in office   INCISION AND DRAINAGE ABSCESS N/A 06/29/2019   Procedure: INCISION AND DRAINAGE;  Surgeon: Greer Pickerel, MD;  Location: [facility];  Service: General;  Laterality: N/A;   IR RADIOLOGIST EVAL & MGMT  03/15/2019   IR RADIOLOGIST EVAL & MGMT  04/06/2019   Kidney stones     LAPAROSCOPIC GASTRIC SLEEVE RESECTION N/A 02/13/2014   Procedure: LAPAROSCOPIC GASTRIC SLEEVE RESECTION LYSIS OF ADHESIONS, UPPER ENDOSCOPY;  Surgeon: [name], MD;  Location: [facility];  Service: General;  Laterality: N/A;   LAPAROSCOPY N/A 09/28/2014   Procedure: LAPAROSCOPY DIAGNOSTIC, INCISION AND DRAINAGE WITH LAPAROSCOPIC EXPLORATION OF ABDOMINAL WALL SEROMA with ultrasound;  Surgeon: Greer Pickerel, MD;  Location[facility];  Service: General;  Laterality: N/A;   LUNG BIOPSY  04/11/2019   Procedure: LUNG BIOPSY;  Surgeon: Collene Gobble, MD;  Location: [facility] ENDOSCOPY;  Service: Pulmonary;;  distal trachea   PLACEMENT OF SETON N/A 05/11/2012   Procedure: PLACEMENT OF SETON;  Surgeon: Donato Heinz, MD;  Location: [facility];  Service: General;  Laterality: N/A;   root canal 7-16     TONSILLECTOMY     TREATMENT FISTULA ANAL     x 2   UPPER GASTROINTESTINAL ENDOSCOPY  05/24/2019   VIDEO BRONCHOSCOPY WITH ENDOBRONCHIAL ULTRASOUND N/A 04/11/2019   Procedure: VIDEO BRONCHOSCOPY WITH ENDOBRONCHIAL ULTRASOUND;  Surgeon: Collene Gobble, MD;  Location: [facility];  Service: Pulmonary;  Laterality: N/A;   Family History:  Family History  Problem Relation Age of Onset   Asthma Maternal Grandmother    Colon polyps Maternal Grandmother    Colon cancer Maternal Grandmother    Esophageal cancer Neg Hx    Rectal cancer Neg Hx    Stomach cancer Neg Hx    Pancreatic cancer Neg Hx    Liver disease Neg Hx    Family Psychiatric  History:  Patient states grandmother, uncle, and daughter diagnosed with bipolar disorder.   Social History:  Social History   Substance and Sexual Activity  Alcohol Use No   Alcohol/week: 0.0 standard drinks of alcohol     Social History   Substance and Sexual Activity  Drug Use Never    Social History   Socioeconomic History   Marital status: Single    Spouse name: Not on file   Number of children: 2   Years of education: 12    Highest education level: Not on file  Occupational History   Occupation: Insurance claims handler: UNEMPLOYED  Tobacco Use   Smoking status: Former    Packs/day: 0.50    Years: 14.00    Total pack years: 7.00    Types: E-cigarettes, Cigarettes    Quit date: 12/20/2013    Years since quitting: 7.8   Smokeless tobacco: Never   Tobacco comments:    Vapor cigarettes.  Vaping Use   Vaping Use: Every day   Last attempt to quit: 02/24/2019   Substances: Nicotine, Flavoring   Devices: started 2015  Substance and Sexual Activity   Alcohol use: No    Alcohol/week: 0.0 standard drinks of alcohol   Drug use: Never   Sexual activity: Not Currently    Birth control/protection: Abstinence  Other Topics Concern   Not on  file  Social History Narrative   2 children   Social Determinants of Health   Financial Resource Strain: Low Risk  (12/19/2020)   Overall Financial Resource Strain (CARDIA)  Difficulty of Paying Living Expenses: Not hard at all  Food Insecurity: No Food Insecurity (12/19/2020)   Hunger Vital Sign    Worried About Running Out of Food in the Last Year: Never true    Ran Out of Food in the Last Year: Never true  Transportation Needs: No Transportation Needs (12/19/2020)   PRAPARE - Hydrologist (Medical): No    Lack of Transportation (Non-Medical): No  Physical Activity: Inactive (12/19/2020)   Exercise Vital Sign    Days of Exercise per Week: 0 days    Minutes of Exercise per Session: 0 min  Stress: No Stress Concern Present (12/19/2020)   [place]    Feeling of Stress : Not at all  Social Connections: Moderately Integrated (12/19/2020)   Social Connection and Isolation Panel [NHANES]    Frequency of Communication with Friends and Family: More than three times a week    Frequency of Social Gatherings with Friends and Family: More than three times a week    Attends Religious Services: 1 to 4 times per year    Active Member of [employer] or Organizations: Yes    Attends Archivist Meetings: 1 to 4 times per year    Marital Status: Never married   Additional Social History:    Allergies:  No Known Allergies  Labs: No results found for this or any previous visit (from the past 48 hour(s)).  Medications:  Current Facility-Administered Medications  Medication Dose Route Frequency Provider Last Rate Last Admin   acetaminophen (TYLENOL) tablet 650 mg  650 mg Oral Z6X PRN Delora Fuel, MD   096 mg at 11/05/21 1526   gabapentin (NEURONTIN) capsule 600 mg  600 mg Oral TID Tretha Sciara, MD   600 mg at 11/06/21 1630   levETIRAcetam (KEPPRA) tablet 250 mg  250 mg Oral BID  Tretha Sciara, MD   250 mg at 11/06/21 1016   lisinopril (ZESTRIL) tablet 20 mg  20 mg Oral Daily Tretha Sciara, MD   20 mg at 11/06/21 1015   lithium carbonate capsule 300 mg  300 mg Oral TID Tretha Sciara, MD   300 mg at 11/06/21 1630   QUEtiapine (SEROQUEL) tablet 800 mg  800 mg Oral QHS Tretha Sciara, MD   800 mg at 11/05/21 2127   ziprasidone (GEODON) capsule 60 mg  60 mg Oral BID Tretha Sciara, MD   60 mg at 11/06/21 1016   Current Outpatient Medications  Medication Sig Dispense Refill   gabapentin (NEURONTIN) 600 MG tablet Take 600 mg by mouth 3 (three) times daily.     levETIRAcetam (KEPPRA) 250 MG tablet Take 250 mg by mouth 2 (two) times daily.     lisinopril (ZESTRIL) 20 MG tablet Take 1 tablet (20 mg total) by mouth daily. 90 tablet 3   lithium carbonate 300 MG capsule Take 300 mg by mouth 3 (three) times daily.     MIRENA, 52 MG, 20 MCG/DAY IUD once.     QUEtiapine (SEROQUEL) 300 MG tablet Take 800 mg by mouth at bedtime.     valACYclovir (VALTREX) 500 MG tablet TAKE 1 TABLET (500 MG TOTAL) BY MOUTH DAILY AS NEEDED (FEVER BLISTER). 90 tablet 2   ziprasidone (GEODON) 60 MG capsule Take 60 mg by mouth 2 (two) times daily.     albuterol (VENTOLIN HFA) 108 (90 Base) MCG/ACT inhaler Inhale 2 puffs into the lungs every 6 (six) hours as needed for  wheezing or shortness of breath. (Patient not taking: Reported on 11/03/2021) 8 g 6   doxycycline (VIBRAMYCIN) 100 MG capsule Take 1 capsule (100 mg total) by mouth 2 (two) times daily. (Patient not taking: Reported on 11/03/2021) 20 capsule 0   HYDROcodone-acetaminophen (NORCO) 10-325 MG tablet Take 1 tablet by mouth 3 (three) times daily. (Patient not taking: Reported on 11/03/2021)     Promethazine-DM 6.25-15 MG/5ML SOLN Take 5 mLs by mouth every 6 (six) hours as needed (Cough). (Patient not taking: Reported on 11/03/2021) 240 mL 0   Suvorexant 20 MG TABS Take 20 mg by mouth at bedtime. (Patient not taking: Reported on  11/03/2021)      Musculoskeletal: Strength & Muscle Tone: within normal limits Gait & Station: normal Patient leans: N/A  Psychiatric Specialty Exam:  Presentation  General Appearance: Appropriate for Environment; Casual; Fairly Groomed  Eye Contact:Good  Speech:Clear and Coherent; Normal Rate  Speech Volume:Normal  Handedness:Right  Mood and Affect  Mood:Anxious  Affect:Congruent  Thought Process  Thought Processes:Coherent  Descriptions of Associations:Intact  Orientation:Full (Time, Place and Person)  Thought Content:Logical; Paranoid Ideation  History of Schizophrenia/Schizoaffective disorder:Yes  Duration of Psychotic Symptoms:Greater than six months  Hallucinations:Hallucinations: Auditory; Visual Description of Auditory Hallucinations: Hearing command voices talking to her to hurt herself.  And the demons wanting to kill her on her birthday on 12/05/2020 Description of Visual Hallucinations: Seeing satan skulls from the walls.  Ideas of Reference:None; Paranoia  Suicidal Thoughts:Suicidal Thoughts: No  Homicidal Thoughts:Homicidal Thoughts: No  Sensorium  Memory:Immediate Fair; Recent Fair; Remote Fair  Judgment:Fair  Insight:Shallow  Executive Functions  Concentration:Fair  Attention Span:Fair  [place]  Language:Good  Psychomotor Activity  Psychomotor Activity:Psychomotor Activity: Normal  Assets  Assets:Communication Skills; Physical Health; Social Support  Sleep  Sleep:Sleep: Good Number of Hours of Sleep: 6 (Was awakened x4 by demons last night)  Physical Exam: Physical Exam Vitals and nursing note reviewed.  HENT:     Head: Normocephalic.     Right Ear: External ear normal.     Nose: Nose normal.     Mouth/Throat:     Mouth: Mucous membranes are moist.     Pharynx: Oropharynx is clear.  Eyes:     Pupils: Pupils are equal, round, and reactive to light.  Cardiovascular:     Rate and Rhythm:  Normal rate.     Pulses: Normal pulses.     Comments: Blood pressure 147/69, pulse 92.  Nursing staff to recheck vital signs Pulmonary:     Effort: Pulmonary effort is normal.  Abdominal:     Palpations: Abdomen is soft.  Genitourinary:    Comments: Deferred Musculoskeletal:        General: Normal range of motion.     Cervical back: Normal range of motion.  Skin:    General: Skin is warm.  Neurological:     General: No focal deficit present.     Mental Status: She is alert and oriented to person, place, and time.  Psychiatric:        Behavior: Behavior normal.    Review of Systems  Constitutional: Negative.  Negative for chills and fever.  HENT: Negative.  Negative for hearing loss and tinnitus.   Eyes: Negative.  Negative for blurred vision and double vision.  Respiratory: Negative.  Negative for cough, sputum production, shortness of breath and wheezing.   Cardiovascular: Negative.  Negative for chest pain and palpitations.  Gastrointestinal:  Negative for abdominal pain, constipation, diarrhea, heartburn,  nausea and vomiting.  Genitourinary: Negative.  Negative for dysuria, frequency and urgency.  Musculoskeletal: Negative.  Negative for myalgias and neck pain.  Skin: Negative.  Negative for itching and rash.  Neurological: Negative.  Negative for dizziness, tingling, tremors and headaches.  Endo/Heme/Allergies: Negative.  Negative for environmental allergies and polydipsia. Does not bruise/bleed easily.  Psychiatric/Behavioral:  Positive for depression and hallucinations. The patient is nervous/anxious.    Blood pressure (!) 147/69, pulse 92, temperature 97.8 F (36.6 C), temperature source Oral, resp. rate 18, height '5\' 6"'$  (1.676 m), weight 131 kg, SpO2 98 %. Body mass index is 46.61 kg/m.  Treatment Plan Summary: Daily contact with patient to assess and evaluate symptoms and progress in treatment and Medication management  Disposition: Recommend psychiatric Inpatient  admission when medically cleared.  This service was provided via telemedicine using a 2-way, interactive audio and video technology.  Names of all persons participating in this telemedicine service and their role in this encounter. Name: Darrick Huntsman Role: Patient  Name: Garrison Columbus, NP Role: Provider  Name: Dr. Mamie Levers Role: [facility] physician  Name: Dr. Mayra Neer Role: [facility], ED physician    Laretta Bolster, [place] 11/06/2021 7:21 PM

## 2021-11-07 DIAGNOSIS — F3178 Bipolar disorder, in full remission, most recent episode mixed: Secondary | ICD-10-CM | POA: Diagnosis not present

## 2021-11-07 DIAGNOSIS — F3160 Bipolar disorder, current episode mixed, unspecified: Secondary | ICD-10-CM

## 2021-11-07 NOTE — Progress Notes (Signed)
Inpatient Behavioral Health Placement.  Meets inpatient criteria per Garrison Columbus, NP.  Referral was sent to the following facilities;   Destination Service Provider Address Phone Fax  [facility]  [address], [place] [address] [phone] [place]  [place], [place] [address] [phone]  [facility]  [place] [address] [place] [address] [place]  [facility]  [address], [place] [address] [phone]  [place] [address] [place] [address] [phone]  [facility]  [address] [place] [address] [phone] [place]  [address] [place] [address] [phone] [facility]  [address], [place] [address] [phone] [facility]  [address] [place] [address] [place]  [address], [place] [address] [phone] [facility]  [address], [place] [address] [phone] [facility]  [address] [place] [address] [phone] [facility]  [place], [place] [address] [phone]  [facility]  [address] [place] [address] [phone]  [facility]  [address] [place] [address] [phone]  [facility]  [place], [place] [address] [phone]    Situation ongoing,  CSW will follow up.   [name], MSW, LCSWA 11/07/2021  @ 2:20 AM

## 2021-11-07 NOTE — ED Notes (Signed)
Daily dressing change completed. No foul drainage noted.
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
## 2021-11-07 NOTE — ED Notes (Signed)
Gave pt some crackers peanut butter and a soda

## 2021-11-08 DIAGNOSIS — F259 Schizoaffective disorder, unspecified: Secondary | ICD-10-CM | POA: Diagnosis present

## 2021-11-08 DIAGNOSIS — F3178 Bipolar disorder, in full remission, most recent episode mixed: Secondary | ICD-10-CM | POA: Diagnosis not present

## 2021-11-08 LAB — LITHIUM LEVEL: Lithium Lvl: 0.66 mmol/L (ref 0.60–1.20)

## 2021-11-08 MED ORDER — VALACYCLOVIR HCL 500 MG PO TABS
2000.0000 mg | ORAL_TABLET | Freq: Two times a day (BID) | ORAL | Status: AC
  Administered 2021-11-08 (×2): 2000 mg via ORAL
  Filled 2021-11-08 (×2): qty 4

## 2021-11-08 MED ORDER — DOCUSATE SODIUM 100 MG PO CAPS
100.0000 mg | ORAL_CAPSULE | Freq: Every day | ORAL | Status: DC
  Administered 2021-11-08 – 2021-11-10 (×3): 100 mg via ORAL
  Filled 2021-11-08 (×3): qty 1

## 2021-11-08 MED ORDER — POLYETHYLENE GLYCOL 3350 17 G PO PACK: 17.0000 g | PACK | Freq: Every day | ORAL | Status: DC | PRN

## 2021-11-08 MED ORDER — SENNOSIDES-DOCUSATE SODIUM 8.6-50 MG PO TABS: 1.0000 | ORAL_TABLET | Freq: Every evening | ORAL | Status: DC | PRN

## 2021-11-08 NOTE — Progress Notes (Signed)
Per Dr. Louis Meckel, DO pt will be reviewed again by [facility] Monday 11/10/21. This CSW will assist and follow with placement.     [name], MSW, [facility] 11/08/2021 12:35 AM

## 2021-11-08 NOTE — ED Provider Notes (Signed)
Emergency Medicine Observation Re-evaluation Note  [name] is a 46 y.o. female, seen on rounds today.  Pt initially presented to the ED for complaints of Psychiatric Evaluation Currently, the patient is calm.  Physical Exam  BP 136/62   Pulse 94   Temp 98.8 F (37.1 C) (Oral)   Resp 18   Ht '5\' 6"'$  (1.676 m)   Wt 131 kg   SpO2 98%   BMI 46.61 kg/m  Physical Exam General: calm nad Psych: cooperative, calm   ED Course / MDM  EKG:EKG Interpretation  Date/Time:  Monday November 03 2021 10:56:46 EDT Ventricular Rate:  93 PR Interval:  138 QRS Duration: 74 QT Interval:  378 QTC Calculation: 469 R Axis:   108 Text Interpretation: Normal sinus rhythm Rightward axis Low voltage QRS Nonspecific ST and T wave abnormality Abnormal ECG When compared with ECG of 03-Nov-2021 10:55, Nonspecific T wave abnormality now evident in Inferior leads Confirmed by Tretha Sciara [phone]) on 11/03/2021 12:38:36 PM  I have reviewed the labs performed to date as well as medications administered while in observation.  Recent changes in the last 24 hours include no acute changes. Pt thinks she is getting a cold sore, she has PRN valtrex, will order. No obvious facial lesions on exam she denies GU lesions  Plan  Current plan is for placement.    Wynona Dove A, DO 11/08/21 1120

## 2021-11-08 NOTE — Consult Note (Signed)
Telepsych Consultation   Reason for Consult:  psych consult Referring Physician:  Tretha Sciara, MD Location of Patient:  [facility] [id number] Location of Provider: [place] Department  Patient Identification: [name] MRN:  [id number] Principal Diagnosis: Schizoaffective disorder [facility]) Diagnosis:  Principal Problem:   Schizoaffective disorder ([place]) Active Problems:   Bipolar I disorder, most recent episode mixed ([place])   Total Time spent with patient: 30 minutes  Subjective:   [name] is a 46 y.o. female patient admitted with aggressive behavior.  Reports "not having a good day and feeling restless". Ruminating on "inpatient and how things can get really bad". Reports not sleeping good but slept until 4; mentions nightmare about "centipedes crawling down my throat and hospital staff were trying to pull them out then waking up with blood shot eye and horrible headache". Says she tried to pray away everything. Mentioned multiple physical ailments as a result of the nightmare. Flight of ideas noted. Feels Seroquel is causing nightmares. Religiously preoccupied mentioning "God" and the "devil" throughout conversation. Says she's been picture herself pulling centipedes out of her throat; "headache was so bad it felt like the centipedes were crawling around my head and down my neck". Endorses thoughts of wanting to harm someone then states "a couple of people"; mentions "baby's daddy" and her son-in-law. Says devil inside of her today and giving her messages; can hear his voice "talking back to me when I want to make something right. But I haven't heard him say he wants to kill me today". Endorses feeling safe with sitters in hospital; feels "something bad out there in the hospital on the stretchers with the people going to facilities". Patient continues to endorse paranoia and delusional thought content, auditory hallucinations, with new onset of homicidal ideations. Per chart  information obtained via [facility] patient has history of bipolar disorder, however current working diagnosis appears to be schizoaffective given thought disorder presentation.    HPI:  [name] is a 46 year female patient with past psychiatric history of bipolar disorder, bipolar affective disorder who presented to [facility] 11/03/21 with her mother at the direction of her psychiatrist for worsening delusions, and altered mental status stating demons were invading her body. Reports she's been living with her mother who's been managing her medications. Has had wound on abdomen x2 years that she said started 13 years ago when her colon ruptured; reports multiple surgeries over the years since due to poor health maintenance. Per chart review, pt presented to [facility] 10/19/21 for anxiety and medication non-compliance. Currently receives outpatient services via [facility], Dr Erling Cruz. UDS-, BAL outstanding. PDMP reveiwed, 10/17/21 Hydrocodone-Acetaminophen 10-325 mg 150 pills filled.  Past Psychiatric History: bipolar disorder  Risk to Self:   Risk to Others:   Prior Inpatient Therapy:   Prior Outpatient Therapy:    Past Medical History:  Past Medical History:  Diagnosis Date   ADHD    Anal fistula    Anxiety    Arthritis    pt denies 5/3 visit    Bipolar affective ([place])    Blood transfusion without reported diagnosis    Colostomy in place [facility])    CVA (cerebrovascular accident) ([place])    left cerebellar infarct found accidentally on CT (2020)    Depression    Diverticulitis 2008   perforated/ requiring resection   Fatty liver    Gallstones    GERD (gastroesophageal reflux disease)    occ.   Headache  after a fall   Herpes simplex    History of colon polyps    History of hiatal hernia    small   History of kidney stones    cystoscopy  basket removal   Hypertension    Iron deficiency anemia    Obese    OCD (obsessive compulsive disorder)     Panic attacks    PONV (postoperative nausea and vomiting) 10/04/2020   Pulmonary nodules    Sarcoidosis    Schizophrenia ([place])    Shortness of breath dyspnea    pt denies on 5/3 visit    Sleep apnea    mild, does not use c-pap machine   Substance abuse ([place])    12 years ago-crack cocaine    Past Surgical History:  Procedure Laterality Date   Abdominal wall hernia  07/2007   open repair with lysis of adhesions   APPLICATION OF WOUND VAC N/A 06/29/2019   Procedure: PLACEMENT OF WOUND VAC;  Surgeon: Greer Pickerel, MD;  Location: [facility];  Service: General;  Laterality: N/A;   BRONCHIAL BRUSHINGS  04/11/2019   Procedure: BRONCHIAL BRUSHINGS;  Surgeon: Collene Gobble, MD;  Location: [facility] ENDOSCOPY;  Service: Pulmonary;;   BRONCHIAL WASHINGS  04/11/2019   Procedure: BRONCHIAL WASHINGS;  Surgeon: Collene Gobble, MD;  Location: [facility] ENDOSCOPY;  Service: Pulmonary;;   COLONOSCOPY  03/13/2010   NOM:VEHMCNOB hemorrhoids likely cause of hematochezia, otherwise normal   COLONOSCOPY  05/24/2019   COLONOSCOPY WITH ESOPHAGOGASTRODUODENOSCOPY (EGD)  04/2019   Colostomy reversal  11/2006   DEBRIDEMENT OF ABDOMINAL WALL ABSCESS N/A 06/29/2019   Procedure: EXCISIONAL DEBRIDEMENT OF ABDOMINAL WALL/SUBCU SEROMA;  Surgeon: Greer Pickerel, MD;  Location: [facility];  Service: General;  Laterality: N/A;   ESOPHAGOGASTRODUODENOSCOPY  03/13/2010   SJG:[id number] esophagus, status post passage of a Maloney dilator/Small hiatal hernia/ Antral erosions, status post biopsy   EXAMINATION UNDER ANESTHESIA N/A 05/11/2012   Procedure: EXAM UNDER ANESTHESIA;  Surgeon: Donato Heinz, MD;  Location: [facility];  Service: General;  Laterality: N/A;   Exploratory laparotomy with resection  2008   colonoscopy   FINE NEEDLE ASPIRATION  04/11/2019   Procedure: FINE NEEDLE ASPIRATION (FNA) LINEAR;  Surgeon: Collene Gobble, MD;  Location: [facility]OSCOPY;  Service: Pulmonary;;   FINGER CLOSED REDUCTION Right 08/05/2012   Procedure: CLOSED  REDUCTION RIGHT THUMB (FINGER);  Surgeon: Linna Hoff, MD;  Location: [facility];  Service: Orthopedics;  Laterality: Right;   HERNIA REPAIR     incisional hernia surgery    Hx of abd wall seroma  08/2007   Drained via [place] in [place], [place]   Hx of abd wall seroma  10/2007   Drained by Dr. Geroge Baseman in office   [place] N/A 06/29/2019   Procedure: INCISION AND DRAINAGE;  Surgeon: Greer Pickerel, MD;  Location: [facility];  Service: General;  Laterality: N/A;   IR RADIOLOGIST EVAL & MGMT  03/15/2019   IR RADIOLOGIST EVAL & MGMT  04/06/2019   Kidney stones     LAPAROSCOPIC GASTRIC SLEEVE RESECTION N/A 02/13/2014   Procedure: LAPAROSCOPIC GASTRIC SLEEVE RESECTION LYSIS OF ADHESIONS, UPPER ENDOSCOPY;  Surgeon: [name], MD;  Location[facility];  Service: General;  Laterality: N/A;   LAPAROSCOPY N/A 09/28/2014   Procedure: LAPAROSCOPY DIAGNOSTIC, INCISION AND DRAINAGE WITH LAPAROSCOPIC EXPLORATION OF ABDOMINAL WALL SEROMA with ultrasound;  Surgeon: Greer Pickerel, MD;  Location[facility];  Service: General;  Laterality: N/A;   LUNG BIOPSY  04/11/2019  Procedure: LUNG BIOPSY;  Surgeon: Collene Gobble, MD;  Location: [facility] ENDOSCOPY;  Service: Pulmonary;;  distal trachea   PLACEMENT OF SETON N/A 05/11/2012   Procedure: PLACEMENT OF SETON;  Surgeon: Donato Heinz, MD;  Location: [facility];  Service: General;  Laterality: N/A;   root canal 7-16     TONSILLECTOMY     TREATMENT FISTULA ANAL     x 2   UPPER GASTROINTESTINAL ENDOSCOPY  05/24/2019   VIDEO BRONCHOSCOPY WITH ENDOBRONCHIAL ULTRASOUND N/A 04/11/2019   Procedure: VIDEO BRONCHOSCOPY WITH ENDOBRONCHIAL ULTRASOUND;  Surgeon: Collene Gobble, MD;  Location: [facility] ENDOSCOPY;  Service: Pulmonary;  Laterality: N/A;   Family History:  Family History  Problem Relation Age of Onset   Asthma Maternal Grandmother    Colon polyps Maternal Grandmother    Colon cancer Maternal Grandmother    Esophageal cancer Neg Hx    Rectal cancer Neg Hx    Stomach  cancer Neg Hx    Pancreatic cancer Neg Hx    Liver disease Neg Hx    Family Psychiatric  History: not noted Social History:  Social History   Substance and Sexual Activity  Alcohol Use No   Alcohol/week: 0.0 standard drinks of alcohol     Social History   Substance and Sexual Activity  Drug Use Never    Social History   Socioeconomic History   Marital status: Single    Spouse name: Not on file   Number of children: 2   Years of education: 12    Highest education level: Not on file  Occupational History   Occupation: Insurance claims handler: UNEMPLOYED  Tobacco Use   Smoking status: Former    Packs/day: 0.50    Years: 14.00    Total pack years: 7.00    Types: E-cigarettes, Cigarettes    Quit date: 12/20/2013    Years since quitting: 7.8   Smokeless tobacco: Never   Tobacco comments:    Vapor cigarettes.  Vaping Use   Vaping Use: Every day   Last attempt to quit: 02/24/2019   Substances: Nicotine, Flavoring   Devices: started 2015  Substance and Sexual Activity   Alcohol use: No    Alcohol/week: 0.0 standard drinks of alcohol   Drug use: Never   Sexual activity: Not Currently    Birth control/protection: Abstinence  Other Topics Concern   Not on file  Social History Narrative   2 children   Social Determinants of Health   Financial Resource Strain: Low Risk  (12/19/2020)   Overall Financial Resource Strain (CARDIA)    Difficulty of Paying Living Expenses: Not hard at all  Food Insecurity: No Food Insecurity (12/19/2020)   Hunger Vital Sign    Worried About Running Out of Food in the Last Year: Never true    Ran Out of Food in the Last Year: Never true  Transportation Needs: No Transportation Needs (12/19/2020)   PRAPARE - Hydrologist (Medical): No    Lack of Transportation (Non-Medical): No  Physical Activity: Inactive (12/19/2020)   Exercise Vital Sign    Days of Exercise per Week: 0 days    Minutes of Exercise per  Session: 0 min  Stress: No Stress Concern Present (12/19/2020)   [place]    Feeling of Stress : Not at all  Social Connections: Moderately Integrated (12/19/2020)   Social Connection and Isolation Panel [NHANES]    Frequency of  Communication with Friends and Family: More than three times a week    Frequency of Social Gatherings with Friends and Family: More than three times a week    Attends Religious Services: 1 to 4 times per year    Active Member of [employer] or Organizations: Yes    Attends Archivist Meetings: 1 to 4 times per year    Marital Status: Never married   Additional Social History:    Allergies:  No Known Allergies  Labs:  Results for orders placed or performed during the hospital encounter of 11/03/21 (from the past 48 hour(s))  Lithium level     Status: None   Collection Time: 11/08/21  6:12 PM  Result Value Ref Range   Lithium Lvl 0.66 0.60 - 1.20 mmol/L    Comment: Performed at [facility], [address], [place], [place] [address]    Medications:  Current Facility-Administered Medications  Medication Dose Route Frequency Provider Last Rate Last Admin   acetaminophen (TYLENOL) tablet 650 mg  650 mg Oral N8G PRN Delora Fuel, MD   956 mg at 11/07/21 2206   docusate sodium (COLACE) capsule 100 mg  100 mg Oral Daily Leevy-Johnson, Esbeidy Mclaine A, NP   100 mg at 11/08/21 1754   gabapentin (NEURONTIN) capsule 600 mg  600 mg Oral TID Tretha Sciara, MD   600 mg at 11/08/21 1650   levETIRAcetam (KEPPRA) tablet 250 mg  250 mg Oral BID Tretha Sciara, MD   250 mg at 11/08/21 1009   lisinopril (ZESTRIL) tablet 20 mg  20 mg Oral Daily Tretha Sciara, MD   20 mg at 11/08/21 1009   lithium carbonate capsule 300 mg  300 mg Oral TID Tretha Sciara, MD   300 mg at 11/08/21 1651   polyethylene glycol (MIRALAX / GLYCOLAX) packet 17 g  17 g Oral Daily PRN Leevy-Johnson, Rachard Isidro A, NP        QUEtiapine (SEROQUEL) tablet 800 mg  800 mg Oral QHS Countryman, Chase, MD   800 mg at 11/07/21 2206   senna-docusate (Senokot-S) tablet 1 tablet  1 tablet Oral QHS PRN Leevy-Johnson, Asberry Lascola A, NP       valACYclovir (VALTREX) tablet 2,000 mg  2,000 mg Oral BID Wynona Dove A, DO   2,000 mg at 11/08/21 1159   ziprasidone (GEODON) capsule 60 mg  60 mg Oral BID Tretha Sciara, MD   60 mg at 11/08/21 1011   Current Outpatient Medications  Medication Sig Dispense Refill   gabapentin (NEURONTIN) 600 MG tablet Take 600 mg by mouth 3 (three) times daily.     levETIRAcetam (KEPPRA) 250 MG tablet Take 250 mg by mouth 2 (two) times daily.     lisinopril (ZESTRIL) 20 MG tablet Take 1 tablet (20 mg total) by mouth daily. 90 tablet 3   lithium carbonate 300 MG capsule Take 300 mg by mouth 3 (three) times daily.     MIRENA, 52 MG, 20 MCG/DAY IUD once.     QUEtiapine (SEROQUEL) 300 MG tablet Take 800 mg by mouth at bedtime.     valACYclovir (VALTREX) 500 MG tablet TAKE 1 TABLET (500 MG TOTAL) BY MOUTH DAILY AS NEEDED (FEVER BLISTER). 90 tablet 2   ziprasidone (GEODON) 60 MG capsule Take 60 mg by mouth 2 (two) times daily.     albuterol (VENTOLIN HFA) 108 (90 Base) MCG/ACT inhaler Inhale 2 puffs into the lungs every 6 (six) hours as needed for wheezing or shortness of breath. (Patient not taking: Reported on  11/03/2021) 8 g 6   doxycycline (VIBRAMYCIN) 100 MG capsule Take 1 capsule (100 mg total) by mouth 2 (two) times daily. (Patient not taking: Reported on 11/03/2021) 20 capsule 0   HYDROcodone-acetaminophen (NORCO) 10-325 MG tablet Take 1 tablet by mouth 3 (three) times daily. (Patient not taking: Reported on 11/03/2021)     Promethazine-DM 6.25-15 MG/5ML SOLN Take 5 mLs by mouth every 6 (six) hours as needed (Cough). (Patient not taking: Reported on 11/03/2021) 240 mL 0   Suvorexant 20 MG TABS Take 20 mg by mouth at bedtime. (Patient not taking: Reported on 11/03/2021)      Musculoskeletal: Strength & Muscle  Tone: within normal limits Gait & Station: normal Patient leans: N/A  Psychiatric Specialty Exam:  Presentation  General Appearance: Casual  Eye Contact:Good  Speech:Pressured  Speech Volume:Normal  Handedness:Right   Mood and Affect  Mood:Dysphoric; Anxious  Affect:Blunt   Thought Process  Thought Processes:Disorganized  Descriptions of Associations:Tangential  Orientation:Full (Time, Place and Person)  Thought Content:Tangential; Illogical; Paranoid Ideation; Perseveration  History of Schizophrenia/Schizoaffective disorder:Yes  Duration of Psychotic Symptoms:Greater than six months  Hallucinations:Hallucinations: Visual; Auditory Description of Auditory Hallucinations: the devil talking to her Description of Visual Hallucinations: seeing demons  Ideas of Reference:Paranoia; Delusions  Suicidal Thoughts:Suicidal Thoughts: No  Homicidal Thoughts:Homicidal Thoughts: Yes, Passive   Sensorium  Memory:Immediate Fair; Recent Fair; Remote Fair  Judgment:Impaired  Insight:Poor   Executive Functions  Concentration:Fair  Attention Span:Fair  [place]   Psychomotor Activity  Psychomotor Activity:Psychomotor Activity: Normal   Assets  Assets:Physical Health; Resilience; Housing; Catering manager; Social Support   Sleep  Sleep:Sleep: Fair    Physical Exam: Physical Exam Vitals and nursing note reviewed.  Constitutional:      Appearance: She is obese.  HENT:     Head: Normocephalic.     Nose: Nose normal.  Eyes:     Pupils: Pupils are equal, round, and reactive to light.  Cardiovascular:     Rate and Rhythm: Tachycardia present.  Musculoskeletal:        General: Normal range of motion.     Cervical back: Normal range of motion.  Skin:    General: Skin is warm.  Neurological:     Mental Status: She is alert and oriented to person, place, and time.  Psychiatric:         Attention and Perception: She perceives auditory and visual hallucinations.        Mood and Affect: Affect is labile and inappropriate.        Speech: Speech is rapid and pressured.        Behavior: Behavior is cooperative.        Thought Content: Thought content is paranoid and delusional. Thought content includes homicidal ideation. Thought content does not include suicidal ideation. Thought content does not include suicidal plan.        Judgment: Judgment is impulsive and inappropriate.    Review of Systems  Neurological:        Headache  Psychiatric/Behavioral:  Positive for hallucinations.   All other systems reviewed and are negative.  Blood pressure (!) 117/56, pulse 96, temperature 97.6 F (36.4 C), temperature source Oral, resp. rate 20, height _0  (1.676 m), weight 131 kg, SpO2 99 %. Body mass index is 46.61 kg/m.  Treatment Plan Summary: Daily contact with patient to assess and evaluate symptoms and progress in treatment, Medication management, and Plan Get repeat EKG, Monitor labs: Lithium level, urine culture. Continue  to seek inpatient psychiatric hospitalization.   Disposition: Recommend psychiatric Inpatient admission when medically cleared. Supportive therapy provided about ongoing stressors. Discussed crisis plan, support from social network, calling 911, coming to the Emergency Department, and calling Suicide Hotline.  This service was provided via telemedicine using a 2-way, interactive audio and video technology.  Names of all persons participating in this telemedicine service and their role in this encounter. Name: Oneida Alar Role: PMHNP  Name: Janine Limbo Role: Attending MD  Name: Lynnell Chad Role: patient  Name:  Role:     Inda Merlin, NP 11/08/2021 6:52 PM

## 2021-11-09 DIAGNOSIS — F259 Schizoaffective disorder, unspecified: Secondary | ICD-10-CM

## 2021-11-09 DIAGNOSIS — F3178 Bipolar disorder, in full remission, most recent episode mixed: Secondary | ICD-10-CM | POA: Diagnosis not present

## 2021-11-09 MED ORDER — BENZTROPINE MESYLATE 1 MG PO TABS
1.0000 mg | ORAL_TABLET | Freq: Three times a day (TID) | ORAL | Status: DC | PRN
  Administered 2021-11-09 – 2021-11-10 (×2): 1 mg via ORAL
  Filled 2021-11-09 (×2): qty 1

## 2021-11-09 MED ORDER — HALOPERIDOL 5 MG PO TABS
5.0000 mg | ORAL_TABLET | Freq: Three times a day (TID) | ORAL | Status: DC | PRN
  Administered 2021-11-09 – 2021-11-10 (×2): 5 mg via ORAL
  Filled 2021-11-09 (×2): qty 1

## 2021-11-09 MED ORDER — LITHIUM CARBONATE 150 MG PO CAPS
600.0000 mg | ORAL_CAPSULE | Freq: Two times a day (BID) | ORAL | Status: DC
  Administered 2021-11-09 – 2021-11-10 (×3): 600 mg via ORAL
  Filled 2021-11-09 (×3): qty 4

## 2021-11-09 NOTE — ED Notes (Signed)
Patient ambulated to the bathroom.

## 2021-11-09 NOTE — ED Provider Notes (Signed)
Emergency Medicine Observation Re-evaluation Note  [name] is a 46 y.o. female, seen on rounds today.  Pt initially presented to the ED for complaints of Psychiatric Evaluation Currently, the patient is calm.  Physical Exam  BP 126/82 (BP Location: Left Wrist)   Pulse 92   Temp 97.9 F (36.6 C) (Oral)   Resp 15   Ht '5\' 6"'$  (1.676 m)   Wt 131 kg   SpO2 98%   BMI 46.61 kg/m  Physical Exam General: nad Psych: calm cooperative  ED Course / MDM  EKG:EKG Interpretation  Date/Time:  Monday November 03 2021 10:56:46 EDT Ventricular Rate:  93 PR Interval:  138 QRS Duration: 74 QT Interval:  378 QTC Calculation: 469 R Axis:   108 Text Interpretation: Normal sinus rhythm Rightward axis Low voltage QRS Nonspecific ST and T wave abnormality Abnormal ECG When compared with ECG of 03-Nov-2021 10:55, Nonspecific T wave abnormality now evident in Inferior leads Confirmed by Tretha Sciara [phone]) on 11/03/2021 12:38:36 PM  I have reviewed the labs performed to date as well as medications administered while in observation.  Recent changes in the last 24 hours include no acute events.  Plan  Current plan is for placement .    [name] A, DO 11/09/21 1431

## 2021-11-09 NOTE — Consult Note (Signed)
Telepsych Consultation   Reason for Consult:  Eval Referring Physician:  Dr. Oswald Hillock Location of Patient: [facility] [phone] Location of Provider: Other: [place]  Patient Identification: KARSTEN VAUGHN MRN:  [id number] Principal Diagnosis: Schizoaffective disorder [facility]) Diagnosis:  Principal Problem:   Schizoaffective disorder ([place]) Active Problems:   Bipolar I disorder, most recent episode mixed ([place])   Total Time spent with patient: 20 minutes  Subjective:   NAKSHATRA KLOSE is a 46 y.o. female patient admitted with with aggressive behaviors and auditory/visual hallucinations.  Patient seen for reevaluation via telepsychiatry.  She tells me she is feeling better today however continues to have auditory hallucinations of a demon speaking to her.  She states the voices are commanding in nature.  Currently the voices are coming through the television telling her to punch and destroy the television.  She tells me there are not many alleviating factors to her auditory hallucinations.  Today she is denying visual hallucinations.  She is denying any suicidal or homicidal ideations.  She does feel like her sleep has improved.  She is able to engage mostly in a coherent conversation, at times she will randomly mention what the demon is saying and telling her to do mid conversation.  We talked about her current medications and her lithium level that resulted at 0.66.  We will be increasing her lithium to 600 mg twice daily.  Patient tells me in the past she was on Haldol for several years for her hallucinations and feels like this worked well for her.  However she did mention EPS symptoms of tongue swelling every time she takes Haldol.  She stated she would take Cogentin with her Haldol and not have any tongue swelling. We also spoke about possibly Zyprexa for PRN relief but she reports previous trial of Zyprexa stating that did not work well for her.  Will start Haldol 5 mg as needed for hallucination relief,  also added 1 mg of Cogentin to be given with Haldol to prevent any EPS.   Her speech appears to be normal in rate and tone.  It seems she has improved in being able to engage in a coherent and mostly linear conversation.  She is circumstantial at times. She does have blunted affect and appears preoccupied.  She tells me her sleep and appetite have improved.  Today she is denying visual hallucinations which is an improvement from yesterday.  However patient still experiencing chronic command auditory hallucinations with little relief from medications.  We will continue to recommend inpatient psychiatric treatment.  HPI:   Per Oneida Alar, NP "GIANNINA BARTOLOME is a 46 year female patient with past psychiatric history of bipolar disorder, bipolar affective disorder who presented to [facility] 11/03/21 with her mother at the direction of her psychiatrist for worsening delusions, and altered mental status stating demons were invading her body. Reports she's been living with her mother who's been managing her medications. Has had wound on abdomen x2 years that she said started 13 years ago when her colon ruptured; reports multiple surgeries over the years since due to poor health maintenance. Per chart review, pt presented to [facility] 10/19/21 for anxiety and medication non-compliance. Currently receives outpatient services via [facility], Dr Erling Cruz. UDS-, BAL outstanding. PDMP reveiwed, 10/17/21 Hydrocodone-Acetaminophen 10-325 mg 150 pills filled."  Past Psychiatric History:  Previous hx of bipolar disorder and polysubstance abuse   Past Medical History:  Past Medical History:  Diagnosis Date   ADHD  Anal fistula    Anxiety    Arthritis    pt denies 5/3 visit    Bipolar affective ([place])    Blood transfusion without reported diagnosis    Colostomy in place [facility])    CVA (cerebrovascular accident) ([place])    left cerebellar infarct found accidentally on CT (2020)     Depression    Diverticulitis 2008   perforated/ requiring resection   Fatty liver    Gallstones    GERD (gastroesophageal reflux disease)    occ.   Headache    after a fall   Herpes simplex    History of colon polyps    History of hiatal hernia    small   History of kidney stones    cystoscopy  basket removal   Hypertension    Iron deficiency anemia    Obese    OCD (obsessive compulsive disorder)    Panic attacks    PONV (postoperative nausea and vomiting) 10/04/2020   Pulmonary nodules    Sarcoidosis    Schizophrenia ([place])    Shortness of breath dyspnea    pt denies on 5/3 visit    Sleep apnea    mild, does not use c-pap machine   Substance abuse ([place])    12 years ago-crack cocaine    Past Surgical History:  Procedure Laterality Date   Abdominal wall hernia  07/2007   open repair with lysis of adhesions   APPLICATION OF WOUND VAC N/A 06/29/2019   Procedure: PLACEMENT OF WOUND VAC;  Surgeon: Greer Pickerel, MD;  Location: [facility];  Service: General;  Laterality: N/A;   BRONCHIAL BRUSHINGS  04/11/2019   Procedure: BRONCHIAL BRUSHINGS;  Surgeon: Collene Gobble, MD;  Location:  [facility]COPY;  Service: Pulmonary;;   BRONCHIAL WASHINGS  04/11/2019   Procedure: BRONCHIAL WASHINGS;  Surgeon: Collene Gobble, MD;  Location: [facility] ENDOSCOPY;  Service: Pulmonary;;   COLONOSCOPY  03/13/2010   TDD:UKGURKYH hemorrhoids likely cause of hematochezia, otherwise normal   COLONOSCOPY  05/24/2019   COLONOSCOPY WITH ESOPHAGOGASTRODUODENOSCOPY (EGD)  04/2019   Colostomy reversal  11/2006   DEBRIDEMENT OF ABDOMINAL WALL ABSCESS N/A 06/29/2019   Procedure: EXCISIONAL DEBRIDEMENT OF ABDOMINAL WALL/SUBCU SEROMA;  Surgeon: Greer Pickerel, MD;  Location: [facility];  Service: General;  Laterality: N/A;   ESOPHAGOGASTRODUODENOSCOPY  03/13/2010   CWC:[id number]-[id number] esophagus, status post passage of a Maloney dilator/Small hiatal hernia/ Antral erosions, status post biopsy   EXAMINATION UNDER ANESTHESIA N/A  05/11/2012   Procedure: EXAM UNDER ANESTHESIA;  Surgeon: Donato Heinz, MD;  Location: [facility];  Service: General;  Laterality: N/A;   Exploratory laparotomy with resection  2008   colonoscopy   FINE NEEDLE ASPIRATION  04/11/2019   Procedure: FINE NEEDLE ASPIRATION (FNA) LINEAR;  Surgeon: Collene Gobble, MD;  Location: [facility]COPY;  Service: Pulmonary;;   FINGER CLOSED REDUCTION Right 08/05/2012   Procedure: CLOSED REDUCTION RIGHT THUMB (FINGER);  Surgeon: Linna Hoff, MD;  Location: [place];  Service: Orthopedics;  Laterality: Right;   HERNIA REPAIR     incisional hernia surgery    Hx of abd wall seroma  08/2007   Drained via [place] in [place], [place]   Hx of abd wall seroma  10/2007   Drained by Dr. Geroge Baseman in office   [place] N/A 06/29/2019   Procedure: INCISION AND DRAINAGE;  Surgeon: Greer Pickerel, MD;  Location: [facility];  Service: General;  Laterality: N/A;   IR RADIOLOGIST EVAL & MGMT  03/15/2019  IR RADIOLOGIST EVAL & MGMT  04/06/2019   Kidney stones     LAPAROSCOPIC GASTRIC SLEEVE RESECTION N/A 02/13/2014   Procedure: LAPAROSCOPIC GASTRIC SLEEVE RESECTION LYSIS OF ADHESIONS, UPPER ENDOSCOPY;  Surgeon: [name], MD;  Location: [facility];  Service: General;  Laterality: N/A;   LAPAROSCOPY N/A 09/28/2014   Procedure: LAPAROSCOPY DIAGNOSTIC, INCISION AND DRAINAGE WITH LAPAROSCOPIC EXPLORATION OF ABDOMINAL WALL SEROMA with ultrasound;  Surgeon: Greer Pickerel, MD;  Location: [facility];  Service: General;  Laterality: N/A;   LUNG BIOPSY  04/11/2019   Procedure: LUNG BIOPSY;  Surgeon: Collene Gobble, MD;  Location: [facility] ENDOSCOPY;  Service: Pulmonary;;  distal trachea   PLACEMENT OF SETON N/A 05/11/2012   Procedure: PLACEMENT OF SETON;  Surgeon: Donato Heinz, MD;  Location: [facility];  Service: General;  Laterality: N/A;   root canal 7-16     TONSILLECTOMY     TREATMENT FISTULA ANAL     x 2   UPPER GASTROINTESTINAL ENDOSCOPY  05/24/2019   VIDEO BRONCHOSCOPY WITH ENDOBRONCHIAL  ULTRASOUND N/A 04/11/2019   Procedure: VIDEO BRONCHOSCOPY WITH ENDOBRONCHIAL ULTRASOUND;  Surgeon: Collene Gobble, MD;  Location: [facility] END[facility];  Service: Pulmonary;  Laterality: N/A;   Family History:  Family History  Problem Relation Age of Onset   Asthma Maternal Grandmother    Colon polyps Maternal Grandmother    Colon cancer Maternal Grandmother    Esophageal cancer Neg Hx    Rectal cancer Neg Hx    Stomach cancer Neg Hx    Pancreatic cancer Neg Hx    Liver disease Neg Hx    Social History:  Social History   Substance and Sexual Activity  Alcohol Use No   Alcohol/week: 0.0 standard drinks of alcohol     Social History   Substance and Sexual Activity  Drug Use Never    Social History   Socioeconomic History   Marital status: Single    Spouse name: Not on file   Number of children: 2   Years of education: 12    Highest education level: Not on file  Occupational History   Occupation: Insurance claims handler: UNEMPLOYED  Tobacco Use   Smoking status: Former    Packs/day: 0.50    Years: 14.00    Total pack years: 7.00    Types: E-cigarettes, Cigarettes    Quit date: 12/20/2013    Years since quitting: 7.8   Smokeless tobacco: Never   Tobacco comments:    Vapor cigarettes.  Vaping Use   Vaping Use: Every day   Last attempt to quit: 02/24/2019   Substances: Nicotine, Flavoring   Devices: started 2015  Substance and Sexual Activity   Alcohol use: No    Alcohol/week: 0.0 standard drinks of alcohol   Drug use: Never   Sexual activity: Not Currently    Birth control/protection: Abstinence  Other Topics Concern   Not on file  Social History Narrative   2 children   Social Determinants of Health   Financial Resource Strain: Low Risk  (12/19/2020)   Overall Financial Resource Strain (CARDIA)    Difficulty of Paying Living Expenses: Not hard at all  Food Insecurity: No Food Insecurity (12/19/2020)   Hunger Vital Sign    Worried About Running Out of Food  in the Last Year: Never true    Ran Out of Food in the Last Year: Never true  Transportation Needs: No Transportation Needs (12/19/2020)   PRAPARE - Transportation    Lack of  Transportation (Medical): No    Lack of Transportation (Non-Medical): No  Physical Activity: Inactive (12/19/2020)   Exercise Vital Sign    Days of Exercise per Week: 0 days    Minutes of Exercise per Session: 0 min  Stress: No Stress Concern Present (12/19/2020)   [place]    Feeling of Stress : Not at all  Social Connections: Moderately Integrated (12/19/2020)   Social Connection and Isolation Panel [NHANES]    Frequency of Communication with Friends and Family: More than three times a week    Frequency of Social Gatherings with Friends and Family: More than three times a week    Attends Religious Services: 1 to 4 times per year    Active Member of [employer] or Organizations: Yes    Attends Archivist Meetings: 1 to 4 times per year    Marital Status: Never married   Allergies:  No Known Allergies  Labs:  Results for orders placed or performed during the hospital encounter of 11/03/21 (from the past 48 hour(s))  Lithium level     Status: None   Collection Time: 11/08/21  6:12 PM  Result Value Ref Range   Lithium Lvl 0.66 0.60 - 1.20 mmol/L    Comment: Performed at [facility], [address], [place], [place] [address]    Medications:  Current Facility-Administered Medications  Medication Dose Route Frequency Provider Last Rate Last Admin   acetaminophen (TYLENOL) tablet 650 mg  650 mg Oral U0A PRN Delora Fuel, MD   540 mg at 11/07/21 2206   benztropine (COGENTIN) tablet 1 mg  1 mg Oral Q8H PRN Vesta Mixer, NP       docusate sodium (COLACE) capsule 100 mg  100 mg Oral Daily Leevy-Johnson, Brooke A, NP   100 mg at 11/09/21 9811   gabapentin (NEURONTIN) capsule 600 mg  600 mg Oral TID Tretha Sciara, MD   600 mg at 11/09/21  9147   haloperidol (HALDOL) tablet 5 mg  5 mg Oral Q8H PRN Vesta Mixer, NP       levETIRAcetam (KEPPRA) tablet 250 mg  250 mg Oral BID Tretha Sciara, MD   250 mg at 11/09/21 0929   lisinopril (ZESTRIL) tablet 20 mg  20 mg Oral Daily Tretha Sciara, MD   20 mg at 11/09/21 8295   lithium carbonate capsule 600 mg  600 mg Oral BID Vesta Mixer, NP       polyethylene glycol (MIRALAX / GLYCOLAX) packet 17 g  17 g Oral Daily PRN Leevy-Johnson, Brooke A, NP       QUEtiapine (SEROQUEL) tablet 800 mg  800 mg Oral QHS Countryman, Chase, MD   800 mg at 11/08/21 2237   senna-docusate (Senokot-S) tablet 1 tablet  1 tablet Oral QHS PRN Leevy-Johnson, Brooke A, NP       ziprasidone (GEODON) capsule 60 mg  60 mg Oral BID Tretha Sciara, MD   60 mg at 11/09/21 6213   Current Outpatient Medications  Medication Sig Dispense Refill   gabapentin (NEURONTIN) 600 MG tablet Take 600 mg by mouth 3 (three) times daily.     levETIRAcetam (KEPPRA) 250 MG tablet Take 250 mg by mouth 2 (two) times daily.     lisinopril (ZESTRIL) 20 MG tablet Take 1 tablet (20 mg total) by mouth daily. 90 tablet 3   lithium carbonate 300 MG capsule Take 300 mg by mouth 3 (three) times daily.     MIRENA, 52 MG,  20 MCG/DAY IUD once.     QUEtiapine (SEROQUEL) 300 MG tablet Take 800 mg by mouth at bedtime.     valACYclovir (VALTREX) 500 MG tablet TAKE 1 TABLET (500 MG TOTAL) BY MOUTH DAILY AS NEEDED (FEVER BLISTER). 90 tablet 2   ziprasidone (GEODON) 60 MG capsule Take 60 mg by mouth 2 (two) times daily.     albuterol (VENTOLIN HFA) 108 (90 Base) MCG/ACT inhaler Inhale 2 puffs into the lungs every 6 (six) hours as needed for wheezing or shortness of breath. (Patient not taking: Reported on 11/03/2021) 8 g 6   doxycycline (VIBRAMYCIN) 100 MG capsule Take 1 capsule (100 mg total) by mouth 2 (two) times daily. (Patient not taking: Reported on 11/03/2021) 20 capsule 0   HYDROcodone-acetaminophen (NORCO) 10-325 MG tablet Take 1 tablet  by mouth 3 (three) times daily. (Patient not taking: Reported on 11/03/2021)     Promethazine-DM 6.25-15 MG/5ML SOLN Take 5 mLs by mouth every 6 (six) hours as needed (Cough). (Patient not taking: Reported on 11/03/2021) 240 mL 0   Suvorexant 20 MG TABS Take 20 mg by mouth at bedtime. (Patient not taking: Reported on 11/03/2021)     Psychiatric Specialty Exam:  Presentation  General Appearance: Appropriate for Environment  Eye Contact:Good  Speech:Clear and Coherent  Speech Volume:Normal  Handedness:Right   Mood and Affect  Mood:Anxious  Affect:Blunt   Thought Process  Thought Processes:Linear  Descriptions of Associations:Circumstantial  Orientation:Full (Time, Place and Person)  Thought Content:Paranoid Ideation  History of Schizophrenia/Schizoaffective disorder:Yes  Duration of Psychotic Symptoms:Greater than six months  Hallucinations:Hallucinations: Auditory Description of Auditory Hallucinations: One demon talking to her Description of Visual Hallucinations: seeing demons  Ideas of Reference:Paranoia  Suicidal Thoughts:Suicidal Thoughts: No  Homicidal Thoughts:Homicidal Thoughts: No   Sensorium  Memory:Immediate Fair; Recent Fair  Judgment:Impaired  Insight:Poor   Executive Functions  Concentration:Fair  Attention Span:Fair  [place]   Psychomotor Activity  Psychomotor Activity:Psychomotor Activity: Normal   Assets  Assets:Physical Health; Resilience; Social Support   Sleep  Sleep:Sleep: Fair    Physical Exam: Physical Exam Neurological:     Mental Status: She is alert and oriented to person, place, and time.  Psychiatric:        Mood and Affect: Affect is blunt.        Behavior: Behavior is cooperative.        Thought Content: Thought content is paranoid.    Review of Systems  Psychiatric/Behavioral:  Positive for hallucinations.    Blood pressure 126/82, pulse 92, temperature  97.9 F (36.6 C), temperature source Oral, resp. rate 15, height _0  (1.676 m), weight 131 kg, SpO2 98 %. Body mass index is 46.61 kg/m.  Treatment Plan Summary: Daily contact with patient to assess and evaluate symptoms and progress in treatment  Disposition: Recommend psychiatric Inpatient admission when medically cleared.  This service was provided via telemedicine using a 2-way, interactive audio and video technology.  Names of all persons participating in this telemedicine service and their role in this encounter. Name: Vesta Mixer Role: PMHNP  Name: Darrick Huntsman Role: Patient  Name:  Role:   Name:  Role:     Vesta Mixer, NP 11/09/2021 2:44 PM

## 2021-11-09 NOTE — ED Notes (Signed)
Pt telling sitter that her medication is wearing off because she can hear the demons arguing in her room. Asking for medication to make them stop. RN notified.

## 2021-11-09 NOTE — ED Notes (Signed)
Pt verbalizing that she is actively having auditory hallucinations of the same as prior days (demon/satan) Verbalizing that she can feel the demon touching her. Denies active visual hallucinations.

## 2021-11-09 NOTE — ED Notes (Signed)
Patient took a shower and wound care provided with wet-to-dry dressing and covered with abdominal pad and paper tape.

## 2021-11-09 NOTE — Progress Notes (Signed)
Per Oneida Alar, NP, patient meets criteria for inpatient treatment. There are no available beds at [facility] today. CSW faxed referrals to the following facilities for review:  [facility] [address] [place] [address] [phone] --  [place], [place] [address] [phone] --  [facility]  Pending - Request Sent N/A [address] [place] [address] [phone] --  [facility]  Pending - Request Sent N/A [address] [place] [address] [phone] --  [facility]  Pending - Request Sent N/A [place] [address] [place] [address] [phone] --  [facility]  Pending - Request Sent N/A [address] [place] [address] [phone] --  [place] N/A [address], [place] [address] [phone] --  [facility]  Pending - Request Sent N/A [address] [place] [address] [phone] --  [facility]  Pending - Request Sent N/A [address], [place] [address] [phone] --  [facility]  Pending - Request Sent N/A [address] [place] [address] [phone] --  [facility]  Pending - Request Sent N/A [address] [place] [address] [phone] --  [place] N/A [address], [place] [address] [phone] --  [facility]  Pending - Request Sent N/A [place], [place] [address] [phone] --  [facility]  Pending -  Request Sent N/A [address] [place] [address] [phone] --  [facility]  Pending - Request Sent N/A [address], [place] [address] [phone] --  [facility]  Pending - Request Sent N/A [place], [place] [address] [phone] --  [facility]  Pending - No Request Sent N/A [address] [place] [address] [phone] --  [place]  Pending - No Request Sent N/A [address] [place] [address] [phone] --  [facility]  Pending - No Request Sent N/A [address], [place] [address] [phone] [facility]  Pending - No Request Sent N/A [address] [place] [address] [phone] --   TTS will continue to seek bed placement.  Glennie [place], MSW, [facility] [place] Phone: [phone] Disposition/TOC

## 2021-11-10 DIAGNOSIS — F3178 Bipolar disorder, in full remission, most recent episode mixed: Secondary | ICD-10-CM | POA: Diagnosis not present

## 2021-11-10 MED ORDER — LORAZEPAM 1 MG PO TABS
1.0000 mg | ORAL_TABLET | Freq: Two times a day (BID) | ORAL | Status: DC | PRN
  Administered 2021-11-10: 1 mg via ORAL
  Filled 2021-11-10: qty 1

## 2021-11-10 MED ORDER — VALACYCLOVIR HCL 500 MG PO TABS
500.0000 mg | ORAL_TABLET | Freq: Once | ORAL | Status: AC
  Administered 2021-11-10: 500 mg via ORAL
  Filled 2021-11-10: qty 1

## 2021-11-10 NOTE — ED Notes (Signed)
Patient reports  she is hearing voices telling her she is  stupid and worthless

## 2021-11-10 NOTE — Progress Notes (Signed)
Per Rica Records Maniattu, RN pt can admit next shift.  [name], MSW, [facility] 11/10/2021 2:46 PM

## 2021-11-10 NOTE — ED Notes (Signed)
Pt in shower.  

## 2021-11-10 NOTE — ED Notes (Signed)
Pt requesting something to help her anxiety; EDP made aware and orders put in and medication giving; pt at bedside eating supper tray

## 2021-11-10 NOTE — ED Notes (Signed)
Wound changed with new wet to dry dressing; saline soaked kerlix packed into wound and abd pad applied and secured with medipore tape; pt tolerated well

## 2021-11-10 NOTE — ED Notes (Signed)
Pt called this RN in room requesting her valtrex and a shower; informed her I would get an order for the valtrex

## 2021-11-10 NOTE — ED Provider Notes (Signed)
Emergency Medicine Observation Re-evaluation Note  [name] is a 46 y.o. female, seen on rounds today.  Pt initially presented to the ED for complaints of Psychiatric Evaluation Currently, the patient is calm and resting on room actively eating meal.  Physical Exam  BP 126/79   Pulse (!) 105   Temp 98.3 F (36.8 C) (Oral)   Resp 15   Ht '5\' 6"'$  (1.676 m)   Wt 131 kg   SpO2 97%   BMI 46.61 kg/m  Physical Exam General: Calm and eating meal no acute distress Cardiac: Mildly tachycardic Lungs: Breathing comfortably Psych: Calm and cooperative  ED Course / MDM  EKG:EKG Interpretation  Date/Time:  Monday November 03 2021 10:56:46 EDT Ventricular Rate:  93 PR Interval:  138 QRS Duration: 74 QT Interval:  378 QTC Calculation: 469 R Axis:   108 Text Interpretation: Normal sinus rhythm Rightward axis Low voltage QRS Nonspecific ST and T wave abnormality Abnormal ECG When compared with ECG of 03-Nov-2021 10:55, Nonspecific T wave abnormality now evident in Inferior leads Confirmed by Tretha Sciara [phone]) on 11/03/2021 12:38:36 PM  I have reviewed the labs performed to date as well as medications administered while in observation.  Recent changes in the last 24 hours include no acute events  Plan  Current plan is for placement for psychiatric inpatient admission.    Elgie Congo, MD 11/10/21 0930

## 2021-11-11 ENCOUNTER — Inpatient Hospital Stay
Admission: EM | Admit: 2021-11-11 | Discharge: 2021-11-14 | DRG: 885 | Disposition: A | Discharge status: Home / Self Care | Payer: 59 | Source: Intra-hospital | Attending: Psychiatry | Admitting: Psychiatry

## 2021-11-11 ENCOUNTER — Other Ambulatory Visit: Payer: Self-pay

## 2021-11-11 ENCOUNTER — Encounter: Payer: Self-pay | Admitting: Psychiatry

## 2021-11-11 DIAGNOSIS — Z79899 Other long term (current) drug therapy: Secondary | ICD-10-CM

## 2021-11-11 DIAGNOSIS — Z8719 Personal history of other diseases of the digestive system: Secondary | ICD-10-CM

## 2021-11-11 DIAGNOSIS — M1990 Unspecified osteoarthritis, unspecified site: Secondary | ICD-10-CM | POA: Diagnosis present

## 2021-11-11 DIAGNOSIS — B009 Herpesviral infection, unspecified: Secondary | ICD-10-CM | POA: Diagnosis present

## 2021-11-11 DIAGNOSIS — K219 Gastro-esophageal reflux disease without esophagitis: Secondary | ICD-10-CM | POA: Diagnosis present

## 2021-11-11 DIAGNOSIS — F429 Obsessive-compulsive disorder, unspecified: Secondary | ICD-10-CM | POA: Diagnosis present

## 2021-11-11 DIAGNOSIS — Z933 Colostomy status: Secondary | ICD-10-CM | POA: Diagnosis not present

## 2021-11-11 DIAGNOSIS — R45851 Suicidal ideations: Secondary | ICD-10-CM | POA: Diagnosis present

## 2021-11-11 DIAGNOSIS — F319 Bipolar disorder, unspecified: Secondary | ICD-10-CM | POA: Diagnosis present

## 2021-11-11 DIAGNOSIS — F411 Generalized anxiety disorder: Secondary | ICD-10-CM | POA: Diagnosis present

## 2021-11-11 DIAGNOSIS — Z79891 Long term (current) use of opiate analgesic: Secondary | ICD-10-CM

## 2021-11-11 DIAGNOSIS — Z8673 Personal history of transient ischemic attack (TIA), and cerebral infarction without residual deficits: Secondary | ICD-10-CM

## 2021-11-11 DIAGNOSIS — K760 Fatty (change of) liver, not elsewhere classified: Secondary | ICD-10-CM | POA: Diagnosis present

## 2021-11-11 DIAGNOSIS — Z6841 Body Mass Index (BMI) 40.0 and over, adult: Secondary | ICD-10-CM

## 2021-11-11 DIAGNOSIS — Z87891 Personal history of nicotine dependence: Secondary | ICD-10-CM | POA: Diagnosis not present

## 2021-11-11 DIAGNOSIS — F909 Attention-deficit hyperactivity disorder, unspecified type: Secondary | ICD-10-CM | POA: Diagnosis present

## 2021-11-11 DIAGNOSIS — I10 Essential (primary) hypertension: Secondary | ICD-10-CM | POA: Diagnosis present

## 2021-11-11 DIAGNOSIS — Z560 Unemployment, unspecified: Secondary | ICD-10-CM

## 2021-11-11 DIAGNOSIS — Z9884 Bariatric surgery status: Secondary | ICD-10-CM | POA: Diagnosis not present

## 2021-11-11 DIAGNOSIS — R918 Other nonspecific abnormal finding of lung field: Secondary | ICD-10-CM | POA: Diagnosis present

## 2021-11-11 DIAGNOSIS — F259 Schizoaffective disorder, unspecified: Secondary | ICD-10-CM | POA: Diagnosis present

## 2021-11-11 DIAGNOSIS — Z87442 Personal history of urinary calculi: Secondary | ICD-10-CM | POA: Diagnosis not present

## 2021-11-11 DIAGNOSIS — D869 Sarcoidosis, unspecified: Secondary | ICD-10-CM | POA: Diagnosis present

## 2021-11-11 DIAGNOSIS — F251 Schizoaffective disorder, depressive type: Secondary | ICD-10-CM | POA: Diagnosis not present

## 2021-11-11 DIAGNOSIS — Z975 Presence of (intrauterine) contraceptive device: Secondary | ICD-10-CM | POA: Diagnosis not present

## 2021-11-11 DIAGNOSIS — S301XXA Contusion of abdominal wall, initial encounter: Secondary | ICD-10-CM | POA: Diagnosis present

## 2021-11-11 LAB — SARS CORONAVIRUS 2 BY RT PCR: SARS Coronavirus 2 by RT PCR: NEGATIVE

## 2021-11-11 MED ORDER — DOCUSATE SODIUM 100 MG PO CAPS
100.0000 mg | ORAL_CAPSULE | Freq: Every day | ORAL | Status: DC
  Administered 2021-11-11 – 2021-11-13 (×3): 100 mg via ORAL
  Filled 2021-11-11 (×3): qty 1

## 2021-11-11 MED ORDER — ALUM & MAG HYDROXIDE-SIMETH 200-200-20 MG/5ML PO SUSP: 30.0000 mL | ORAL | Status: DC | PRN

## 2021-11-11 MED ORDER — ZIPRASIDONE HCL 40 MG PO CAPS
60.0000 mg | ORAL_CAPSULE | Freq: Two times a day (BID) | ORAL | Status: DC
  Administered 2021-11-11 – 2021-11-14 (×7): 60 mg via ORAL
  Filled 2021-11-11 (×7): qty 1

## 2021-11-11 MED ORDER — GABAPENTIN 300 MG PO CAPS
600.0000 mg | ORAL_CAPSULE | Freq: Three times a day (TID) | ORAL | Status: DC
  Administered 2021-11-11 – 2021-11-14 (×11): 600 mg via ORAL
  Filled 2021-11-11 (×11): qty 2

## 2021-11-11 MED ORDER — QUETIAPINE FUMARATE 200 MG PO TABS
800.0000 mg | ORAL_TABLET | Freq: Every day | ORAL | Status: DC
  Administered 2021-11-11 – 2021-11-13 (×3): 800 mg via ORAL
  Filled 2021-11-11 (×3): qty 4

## 2021-11-11 MED ORDER — POLYETHYLENE GLYCOL 3350 17 G PO PACK: 17.0000 g | PACK | Freq: Every day | ORAL | Status: DC | PRN

## 2021-11-11 MED ORDER — SENNOSIDES-DOCUSATE SODIUM 8.6-50 MG PO TABS: 1.0000 | ORAL_TABLET | Freq: Every evening | ORAL | Status: DC | PRN

## 2021-11-11 MED ORDER — MAGNESIUM HYDROXIDE 400 MG/5ML PO SUSP: 30.0000 mL | Freq: Every day | ORAL | Status: DC | PRN

## 2021-11-11 MED ORDER — LITHIUM CARBONATE 300 MG PO CAPS
600.0000 mg | ORAL_CAPSULE | Freq: Two times a day (BID) | ORAL | Status: DC
  Administered 2021-11-11 – 2021-11-14 (×7): 600 mg via ORAL
  Filled 2021-11-11 (×7): qty 2

## 2021-11-11 MED ORDER — HALOPERIDOL 5 MG PO TABS
5.0000 mg | ORAL_TABLET | Freq: Two times a day (BID) | ORAL | Status: DC
  Administered 2021-11-11 – 2021-11-14 (×6): 5 mg via ORAL
  Filled 2021-11-11 (×6): qty 1

## 2021-11-11 MED ORDER — ACETAMINOPHEN 325 MG PO TABS
650.0000 mg | ORAL_TABLET | Freq: Four times a day (QID) | ORAL | Status: DC | PRN
  Administered 2021-11-12 – 2021-11-14 (×5): 650 mg via ORAL
  Filled 2021-11-11 (×5): qty 2

## 2021-11-11 MED ORDER — BENZTROPINE MESYLATE 1 MG PO TABS
1.0000 mg | ORAL_TABLET | Freq: Three times a day (TID) | ORAL | Status: DC | PRN
  Administered 2021-11-11: 1 mg via ORAL
  Filled 2021-11-11: qty 1

## 2021-11-11 MED ORDER — LORAZEPAM 1 MG PO TABS
1.0000 mg | ORAL_TABLET | Freq: Every day | ORAL | Status: DC
  Administered 2021-11-11 – 2021-11-13 (×3): 1 mg via ORAL
  Filled 2021-11-11 (×3): qty 1

## 2021-11-11 MED ORDER — HALOPERIDOL 5 MG PO TABS
5.0000 mg | ORAL_TABLET | Freq: Three times a day (TID) | ORAL | Status: DC | PRN
  Administered 2021-11-11: 5 mg via ORAL
  Filled 2021-11-11: qty 1

## 2021-11-11 MED ORDER — LISINOPRIL 20 MG PO TABS
20.0000 mg | ORAL_TABLET | Freq: Every day | ORAL | Status: DC
  Administered 2021-11-11 – 2021-11-14 (×4): 20 mg via ORAL
  Filled 2021-11-11 (×4): qty 1

## 2021-11-11 MED ORDER — LEVETIRACETAM 250 MG PO TABS
250.0000 mg | ORAL_TABLET | Freq: Two times a day (BID) | ORAL | Status: DC
  Administered 2021-11-11: 250 mg via ORAL
  Filled 2021-11-11: qty 1

## 2021-11-11 MED ORDER — VALACYCLOVIR HCL 500 MG PO TABS
500.0000 mg | ORAL_TABLET | Freq: Two times a day (BID) | ORAL | Status: AC
  Administered 2021-11-11 – 2021-11-14 (×6): 500 mg via ORAL
  Filled 2021-11-11 (×6): qty 1

## 2021-11-11 NOTE — Tx Team (Signed)
Initial Treatment Plan 11/11/2021 4:45 AM Lynnell Chad HDT:[id number]    PATIENT STRESSORS: Health problems   Medication change or noncompliance     PATIENT STRENGTHS: Communication skills  General fund of knowledge  Motivation for treatment/growth    PATIENT IDENTIFIED PROBLEMS: Bipolar Disorder  Psychosis  hallucinations                 DISCHARGE CRITERIA:  Improved stabilization in mood, thinking, and/or behavior  PRELIMINARY DISCHARGE PLAN: Outpatient therapy  PATIENT/FAMILY INVOLVEMENT: This treatment plan has been presented to and reviewed with the patient, [name], and/or family member,  .  The patient and family have been given the opportunity to ask questions and make suggestions.  Floyde Parkins, RN 11/11/2021, 4:45 AM

## 2021-11-11 NOTE — Progress Notes (Signed)
Recreation Therapy Notes  Date: 11/11/2021  Time: 10:05   Location: Courtyard       Behavioral response: N/A   Intervention Topic: Wellness    Discussion/Intervention: Patient refused to attend group.   Clinical Observations/Feedback:  Patient refused to attend group.    Boby Eyer LRT/CTRS        Zackory Pudlo 11/11/2021 12:09 PM

## 2021-11-11 NOTE — H&P (Signed)
Psychiatric Admission Assessment Adult  Patient Identification: [name] MRN:  [id number] Date of Evaluation:  11/11/2021 Chief Complaint:  Schizoaffective disorder ([place]) [F25.9] Principal Diagnosis: Schizoaffective disorder ([place]) Diagnosis:  Principal Problem:   Schizoaffective disorder ([place]) Active Problems:   GERD   Morbid obesity with BMI of 40.0-44.9, adult (HCC)   Abdominal wall seroma - chronic  History of Present Illness: Patient seen and chart reviewed.  46 year old woman with a history of schizoaffective disorder.  She [facility] with mood instability and anxiety depression psychotic symptoms with hallucinations and suicidal thoughts.  Patient tells me that she had abruptly stopped her own narcotic pain medicine because she felt it was keeping her from being able to function normally.  When she did that she started drinking and overusing her psychiatric medicine to deal with it.  Sounds like she got even more agitated and confused and then had to come into the ER.  She has been there for about a week getting medication and stabilized.  Today she tells me she is feeling much better.  Denies any acute symptoms of opiate withdrawal.  Hallucinations are still present but are much decreased.  Still sometimes have commands but she is able to ignore them for the most part.  She has no current suicidal or homicidal thought.  Mood remains nervous.  Patient tells me she has chronic mood and anxiety symptoms for which she sees an outpatient psychiatrist.  The medicine she had been on recently seemed to have been working pretty well.  Patient lives independently but has family living very close by.  Sounds like she has a little bit of family stress especially with her grown daughter but otherwise supportive outpatient situation.  Patient has medical problems most notably a chronic abdominal wound which needs daily wound care. Associated  Signs/Symptoms: Depression Symptoms:  depressed mood, difficulty concentrating, suicidal thoughts without plan, Duration of Depression Symptoms: Greater than two weeks  (Hypo) Manic Symptoms:  Impulsivity, Anxiety Symptoms:  Excessive Worry, Psychotic Symptoms:  Hallucinations: Auditory PTSD Symptoms: Negative Total Time spent with patient: 45 minutes  Past Psychiatric History: Patient has a long history of chronic mental health problems.  She sees an outpatient provider in [facility].  Current medications outside the hospital include lithium Seroquel and Geodon together.  She has had electroconvulsive therapy in the past on 1 or 2 occasions with some response.  Patient says she has had intermittent problems with drinking too much but does not feel that she in general has a substance use problem.  She does have a history of previous inpatient treatment.  Is the patient at risk to self? Yes.    Has the patient been a risk to self in the past 6 months? Yes.    Has the patient been a risk to self within the distant past? Yes.    Is the patient a risk to others? No.  Has the patient been a risk to others in the past 6 months? No.  Has the patient been a risk to others within the distant past? No.   [place] Scale:  Flowsheet Row Admission (Current) from 11/11/2021 in [place] ED from 11/03/2021 in [place] ED from 10/19/2021 in [facility] No Risk No Risk No Risk        Prior Inpatient Therapy:   Prior Outpatient Therapy:    Alcohol Screening:  Substance Abuse History in the last 12 months:  Yes.   Consequences of Substance Abuse: Sounds like the alcohol use after she stopped the opiate use was worsening symptoms Previous Psychotropic Medications: Yes  Psychological Evaluations: Yes  Past Medical History:  Past Medical History:  Diagnosis Date   ADHD    Anal fistula    Anxiety     Arthritis    pt denies 5/3 visit    Bipolar affective ([place])    Blood transfusion without reported diagnosis    Colostomy in place [facility])    CVA (cerebrovascular accident) ([place])    left cerebellar infarct found accidentally on CT (2020)    Depression    Diverticulitis 2008   perforated/ requiring resection   Fatty liver    Gallstones    GERD (gastroesophageal reflux disease)    occ.   Headache    after a fall   Herpes simplex    History of colon polyps    History of hiatal hernia    small   History of kidney stones    cystoscopy  basket removal   Hypertension    Iron deficiency anemia    Obese    OCD (obsessive compulsive disorder)    Panic attacks    PONV (postoperative nausea and vomiting) 10/04/2020   Pulmonary nodules    Sarcoidosis    Schizophrenia ([place])    Shortness of breath dyspnea    pt denies on 5/3 visit    Sleep apnea    mild, does not use c-pap machine   Substance abuse ([place])    12 years ago-crack cocaine    Past Surgical History:  Procedure Laterality Date   Abdominal wall hernia  07/2007   open repair with lysis of adhesions   APPLICATION OF WOUND VAC N/A 06/29/2019   Procedure: PLACEMENT OF WOUND VAC;  Surgeon: Greer Pickerel, MD;  Location: [facility];  Service: General;  Laterality: N/A;   BRONCHIAL BRUSHINGS  04/11/2019   Procedure: BRONCHIAL BRUSHINGS;  Surgeon: Collene Gobble, MD;  Location: [facility] ENDOSCOPY;  Service: Pulmonary;;   BRONCHIAL WASHINGS  04/11/2019   Procedure: BRONCHIAL WASHINGS;  Surgeon: Collene Gobble, MD;  Location: [facility] ENDOSCOPY;  Service: Pulmonary;;   COLONOSCOPY  03/13/2010   YNW:GNFAOZHY hemorrhoids likely cause of hematochezia, otherwise normal   COLONOSCOPY  05/24/2019   COLONOSCOPY WITH ESOPHAGOGASTRODUODENOSCOPY (EGD)  04/2019   Colostomy reversal  11/2006   DEBRIDEMENT OF ABDOMINAL WALL ABSCESS N/A 06/29/2019   Procedure: EXCISIONAL DEBRIDEMENT OF ABDOMINAL WALL/SUBCU SEROMA;  Surgeon: Greer Pickerel, MD;  Location: [facility];   Service: General;  Laterality: N/A;   ESOPHAGOGASTRODUODENOSCOPY  03/13/2010   QMV:[id number] esophagus, status post passage of a [place] dilator/Small hiatal hernia/ Antral erosions, status post biopsy   EXAMINATION UNDER ANESTHESIA N/A 05/11/2012   Procedure: EXAM UNDER ANESTHESIA;  Surgeon: Donato Heinz, MD;  Location: [facility];  Service: General;  Laterality: N/A;   Exploratory laparotomy with resection  2008   colonoscopy   FINE NEEDLE ASPIRATION  04/11/2019   Procedure: FINE NEEDLE ASPIRATION (FNA) LINEAR;  Surgeon: Collene Gobble, MD;  Location: [facility];  Service: Pulmonary;;   FINGER CLOSED REDUCTION Right 08/05/2012   Procedure: CLOSED REDUCTION RIGHT THUMB (FINGER);  Surgeon: Linna Hoff, MD;  Location: [facility];  Service: Orthopedics;  Laterality: Right;   HERNIA REPAIR     incisional hernia surgery    Hx of abd wall seroma  08/2007   Drained via [place] in [name], [place]  Hx of abd wall seroma  10/2007   Drained by Dr. Geroge Baseman in office   INCISION AND DRAINAGE ABSCESS N/A 06/29/2019   Procedure: INCISION AND DRAINAGE;  Surgeon: Greer Pickerel, MD;  Location: [facility];  Service: General;  Laterality: N/A;   IR RADIOLOGIST EVAL & MGMT  03/15/2019   IR RADIOLOGIST EVAL & MGMT  04/06/2019   Kidney stones     LAPAROSCOPIC GASTRIC SLEEVE RESECTION N/A 02/13/2014   Procedure: LAPAROSCOPIC GASTRIC SLEEVE RESECTION LYSIS OF ADHESIONS, UPPER ENDOSCOPY;  Surgeon: [name], MD;  Location: [facility];  Service: General;  Laterality: N/A;   LAPAROSCOPY N/A 09/28/2014   Procedure: LAPAROSCOPY DIAGNOSTIC, INCISION AND DRAINAGE WITH LAPAROSCOPIC EXPLORATION OF ABDOMINAL WALL SEROMA with ultrasound;  Surgeon: Greer Pickerel, MD;  Location: [facility];  Service: General;  Laterality: N/A;   LUNG BIOPSY  04/11/2019   Procedure: LUNG BIOPSY;  Surgeon: Collene Gobble, MD;  Location: [facility] ENDOSCOPY;  Service: Pulmonary;;  distal trachea   PLACEMENT OF SETON N/A 05/11/2012   Procedure: PLACEMENT OF SETON;   Surgeon: Donato Heinz, MD;  Location: [facility];  Service: General;  Laterality: N/A;   root canal 7-16     TONSILLECTOMY     TREATMENT FISTULA ANAL     x 2   UPPER GASTROINTESTINAL ENDOSCOPY  05/24/2019   VIDEO BRONCHOSCOPY WITH ENDOBRONCHIAL ULTRASOUND N/A 04/11/2019   Procedure: VIDEO BRONCHOSCOPY WITH ENDOBRONCHIAL ULTRASOUND;  Surgeon: Collene Gobble, MD;  Location: [facility] ENDOSCOPY;  Service: Pulmonary;  Laterality: N/A;   Family History:  Family History  Problem Relation Age of Onset   Asthma Maternal Grandmother    Colon polyps Maternal Grandmother    Colon cancer Maternal Grandmother    Esophageal cancer Neg Hx    Rectal cancer Neg Hx    Stomach cancer Neg Hx    Pancreatic cancer Neg Hx    Liver disease Neg Hx    Family Psychiatric  History: Does not report any Tobacco Screening:   Social History:  Social History   Substance and Sexual Activity  Alcohol Use No   Alcohol/week: 0.0 standard drinks of alcohol     Social History   Substance and Sexual Activity  Drug Use Never    Additional Social History: Marital status: Single Does patient have children?: Yes How many children?: 1 How is patient's relationship with their children?: "very good".  CSW notes that patient previously stated she had two children, an adult daughter and teenage son, patient only mentioned the teenage son this admission"                         Allergies:  No Known Allergies Lab Results:  Results for orders placed or performed during the hospital encounter of 11/03/21 (from the past 48 hour(s))  SARS Coronavirus 2 by RT PCR (hospital order, performed in [facility] hospital lab) *cepheid single result test* Anterior Nasal Swab     Status: None   Collection Time: 11/10/21 11:35 PM   Specimen: Anterior Nasal Swab  Result Value Ref Range   SARS Coronavirus 2 by RT PCR NEGATIVE NEGATIVE    Comment: (NOTE) SARS-CoV-2 target nucleic acids are NOT DETECTED.  The SARS-CoV-2 RNA is  generally detectable in upper and lower respiratory specimens during the acute phase of infection. The lowest concentration of SARS-CoV-2 viral copies this assay can detect is 250 copies / mL. A negative result does not preclude SARS-CoV-2 infection and should not be used  as the sole basis for treatment or other patient management decisions.  A negative result may occur with improper specimen collection / handling, submission of specimen other than nasopharyngeal swab, presence of viral mutation(s) within the areas targeted by this assay, and inadequate number of viral copies (<250 copies / mL). A negative result must be combined with clinical observations, patient history, and epidemiological information.  Fact Sheet for Patients:   https://www.patel.info/  Fact Sheet for Healthcare Providers: https://hall.com/  This test is not yet approved or  cleared by the [place] FDA and has been authorized for detection and/or diagnosis of SARS-CoV-2 by FDA under an Emergency Use Authorization (EUA).  This EUA will remain in effect (meaning this test can be used) for the duration of the COVID-19 declaration under Section 564(b)(1) of the Act, 21 U.S.C. section 360bbb-3(b)(1), unless the authorization is terminated or revoked sooner.  Performed at [facility], [address], [place], [place] [address]     Blood Alcohol level:  Lab Results  Component Value Date   [facility] <10 04/14/2021   ETH <10 27/78/2423    Metabolic Disorder Labs:  Lab Results  Component Value Date   HGBA1C 5.2 08/16/2020   MPG 103 08/16/2020   No results found for: "PROLACTIN" Lab Results  Component Value Date   CHOL 196 03/04/2021   TRIG 115 03/04/2021   HDL 64 03/04/2021   CHOLHDL 3.1 03/04/2021   VLDL 33 08/02/2013   LDLCALC 112 (H) 03/04/2021   LDLCALC 107 (H) 01/09/2019    Current Medications: Current Facility-Administered Medications  Medication Dose  Route Frequency Provider Last Rate Last Admin   acetaminophen (TYLENOL) tablet 650 mg  650 mg Oral Q6H PRN Allecia Bells, [name], MD       alum & mag hydroxide-simeth (MAALOX/MYLANTA) 200-200-20 MG/5ML suspension 30 mL  30 mL Oral Q4H PRN Nyanna Heideman T, MD       benztropine (COGENTIN) tablet 1 mg  1 mg Oral Q8H PRN Redell Nazir, [name], MD   1 mg at 11/11/21 0834   docusate sodium (COLACE) capsule 100 mg  100 mg Oral Daily Hollee Fate, [name], MD   100 mg at 11/11/21 0834   gabapentin (NEURONTIN) capsule 600 mg  600 mg Oral TID Dimitriy Carreras, [name], MD   600 mg at 11/11/21 5361   haloperidol (HALDOL) tablet 5 mg  5 mg Oral BID Rakel Junio T, MD       lisinopril (ZESTRIL) tablet 20 mg  20 mg Oral Daily Netasha Wehrli, [name], MD   20 mg at 11/11/21 4431   lithium carbonate capsule 600 mg  600 mg Oral BID Roniqua Kintz T, MD   600 mg at 11/11/21 0831   LORazepam (ATIVAN) tablet 1 mg  1 mg Oral Q1500 Alaycia Eardley T, MD       magnesium hydroxide (MILK OF MAGNESIA) suspension 30 mL  30 mL Oral Daily PRN Nattalie Santiesteban T, MD       polyethylene glycol (MIRALAX / GLYCOLAX) packet 17 g  17 g Oral Daily PRN Daniya Aramburo T, MD       QUEtiapine (SEROQUEL) tablet 800 mg  800 mg Oral QHS Kalsey Lull T, MD       senna-docusate (Senokot-S) tablet 1 tablet  1 tablet Oral QHS PRN Heavin Sebree T, MD       ziprasidone (GEODON) capsule 60 mg  60 mg Oral BID Marque Bango, [name], MD   60 mg at 11/11/21 5400   PTA Medications: Medications  Prior to Admission  Medication Sig Dispense Refill Last Dose   albuterol (VENTOLIN HFA) 108 (90 Base) MCG/ACT inhaler Inhale 2 puffs into the lungs every 6 (six) hours as needed for wheezing or shortness of breath. (Patient not taking: Reported on 11/03/2021) 8 g 6    doxycycline (VIBRAMYCIN) 100 MG capsule Take 1 capsule (100 mg total) by mouth 2 (two) times daily. (Patient not taking: Reported on 11/03/2021) 20 capsule 0    gabapentin (NEURONTIN) 600 MG tablet Take 600 mg by mouth 3 (three) times daily.       HYDROcodone-acetaminophen (NORCO) 10-325 MG tablet Take 1 tablet by mouth 3 (three) times daily. (Patient not taking: Reported on 11/03/2021)      levETIRAcetam (KEPPRA) 250 MG tablet Take 250 mg by mouth 2 (two) times daily.      lisinopril (ZESTRIL) 20 MG tablet Take 1 tablet (20 mg total) by mouth daily. 90 tablet 3    lithium carbonate 300 MG capsule Take 300 mg by mouth 3 (three) times daily.      MIRENA, 52 MG, 20 MCG/DAY IUD once.      Promethazine-DM 6.25-15 MG/5ML SOLN Take 5 mLs by mouth every 6 (six) hours as needed (Cough). (Patient not taking: Reported on 11/03/2021) 240 mL 0    QUEtiapine (SEROQUEL) 300 MG tablet Take 800 mg by mouth at bedtime.      Suvorexant 20 MG TABS Take 20 mg by mouth at bedtime. (Patient not taking: Reported on 11/03/2021)      valACYclovir (VALTREX) 500 MG tablet TAKE 1 TABLET (500 MG TOTAL) BY MOUTH DAILY AS NEEDED (FEVER BLISTER). 90 tablet 2    ziprasidone (GEODON) 60 MG capsule Take 60 mg by mouth 2 (two) times daily.       Musculoskeletal: Strength & Muscle Tone: within normal limits Gait & Station: normal Patient leans: N/A            Psychiatric Specialty Exam:  Presentation  General Appearance: Appropriate for Environment  Eye Contact:Good  Speech:Clear and Coherent  Speech Volume:Normal  Handedness:Right   Mood and Affect  Mood:Anxious  Affect:Blunt   Thought Process  Thought Processes:Linear  Duration of Psychotic Symptoms: Greater than six months  Past Diagnosis of Schizophrenia or Psychoactive disorder: Yes  Descriptions of Associations:Circumstantial  Orientation:Full (Time, Place and Person)  Thought Content:Paranoid Ideation  Hallucinations:No data recorded Ideas of Reference:Paranoia  Suicidal Thoughts:No data recorded Homicidal Thoughts:No data recorded  Sensorium  Memory:Immediate Fair; Recent Fair  Judgment:Impaired  Insight:Poor   Executive Functions  Concentration:Fair  Attention  Span:Fair  [place]   Psychomotor Activity  Psychomotor Activity:No data recorded  [place]; Resilience; Social Support   Sleep  Sleep:No data recorded   Physical Exam: Physical Exam Vitals and nursing note reviewed.  Constitutional:      Appearance: Normal appearance.  HENT:     Head: Normocephalic and atraumatic.     Mouth/Throat:     Pharynx: Oropharynx is clear.  Eyes:     Pupils: Pupils are equal, round, and reactive to light.  Cardiovascular:     Rate and Rhythm: Normal rate and regular rhythm.  Pulmonary:     Effort: Pulmonary effort is normal.     Breath sounds: Normal breath sounds.  Abdominal:     General: Abdomen is flat.     Palpations: Abdomen is soft.  Musculoskeletal:        General: Normal range of motion.  Skin:  General: Skin is warm and dry.  Neurological:     General: No focal deficit present.     Mental Status: She is alert. Mental status is at baseline.  Psychiatric:        Attention and Perception: Attention normal. She perceives auditory hallucinations.        Mood and Affect: Mood is anxious.        Speech: Speech normal.        Behavior: Behavior is cooperative.        Thought Content: Thought content normal.        Cognition and Memory: Cognition normal.    Review of Systems  Constitutional: Negative.   HENT: Negative.    Eyes: Negative.   Respiratory: Negative.    Cardiovascular: Negative.   Gastrointestinal: Negative.   Musculoskeletal: Negative.   Skin: Negative.   Neurological: Negative.   Psychiatric/Behavioral:  Positive for hallucinations. The patient is nervous/anxious.    Blood pressure (!) 146/104, pulse 91, temperature 97.8 F (36.6 C), temperature source Oral, resp. rate 16, height '5\' 7"'$  (1.702 m), weight 129.7 kg, SpO2 100 %. Body mass index is 44.79 kg/m.  Treatment Plan Summary: Medication management and Plan reviewed medications with the  patient.  The Keppra is not being prescribed for seizures but rather for anxiety according to the patient.  I suggested to her that this was probably not the best choice and we agreed to try starting a small dose of Ativan in the afternoon.  She has had time in the past when small doses of Valium more effective.  Perhaps they had recently been discontinued because of the narcotic use but she has no interest in going back to narcotics.  Otherwise continue current doses of Seroquel Geodon and lithium.  Most recent lithium level was therapeutic at 0.66.  Nursing will assist patient in doing daily wound care.  Encourage patient to be involved in therapeutic groups and activities on the unit.  Daily assessment of dangerousness prior to appropriate discharge back to home care.  Observation Level/Precautions:  15 minute checks  Laboratory:  UDS  Psychotherapy:    Medications:    Consultations:    Discharge Concerns:    Estimated LOS:  Other:     Physician Treatment Plan for Primary Diagnosis: Schizoaffective disorder ([place]) Long Term Goal(s): Improvement in symptoms so as ready for discharge  Short Term Goals: Ability to verbalize feelings will improve, Ability to disclose and discuss suicidal ideas, and Ability to demonstrate self-control will improve  Physician Treatment Plan for Secondary Diagnosis: Principal Problem:   Schizoaffective disorder ([place]) Active Problems:   GERD   Morbid obesity with BMI of 40.0-44.9, adult (HCC)   Abdominal wall seroma - chronic  Long Term Goal(s): Improvement in symptoms so as ready for discharge  Short Term Goals: Compliance with prescribed medications will improve  I certify that inpatient services furnished can reasonably be expected to improve the patient's condition.    Alethia Berthold, MD 9/19/202311:58 AM

## 2021-11-11 NOTE — Progress Notes (Signed)
Skin and contraband search completed and witnessed by [facility], Therapist, sports. Patient has chronic abdominal wound that was re-dressed prior to patient arrival. Dressing clean, dry and intact. Peri-wound, unremarkable. No other skin issues noted, no contraband found. Wound consult needed to assess and treat wound.
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
## 2021-11-11 NOTE — BHH Suicide Risk Assessment (Signed)
[place] INPATIENT:  Family/Significant Other Suicide Prevention Education  Suicide Prevention Education:  Contact Attempts: [name], mother, [phone] has been identified by the patient as the family member/significant other with whom the patient will be residing, and identified as the person(s) who will aid the patient in the event of a mental health crisis.  With written consent from the patient, two attempts were made to provide suicide prevention education, prior to and/or following the patient's discharge.  We were unsuccessful in providing suicide prevention education.  A suicide education pamphlet was given to the patient to share with family/significant other.  Date and time of first attempt: 11/11/2021 12:08PM Date and time of second attempt: Second is attempt is needed  CSW left HIPAA compliant voicemail.  Rozann Lesches 11/11/2021, 12:08 PM

## 2021-11-11 NOTE — ED Notes (Addendum)
Sheriff arrived to transport pt, two bags of pt belongings given to Alvan.

## 2021-11-11 NOTE — ED Provider Notes (Signed)
She has been accepted for inpatient psychiatric care at [facility], accepting physician Alethia Berthold MD.   Delora Fuel, MD 20/81/38 [phone]

## 2021-11-11 NOTE — Progress Notes (Signed)
Pt was accepted to [facility] 11/11/21; BED ASSIGNMENT 301  Pt meets inpatient criteria per Vesta Mixer, NP  Attending Physician will be Alethia Berthold, MD   Report can be called [phone]  Pt can arrive: BED IS [place] Team Notified: Vaughan Browner, LCAS-A, Floyde Parkins, RN Alethia Berthold, MD, Ernst Breach, RN, Ernst Breach, RN, Merlene Morse, RN, Ian Malkin, Counselor, Nicoletta Ba, Counselor, Marylee Floras, [facility], LCAS, Caren Griffins, DO   [place], LCSWA 11/11/2021 @ 12:16 AM

## 2021-11-11 NOTE — BHH Suicide Risk Assessment (Signed)
[place] INPATIENT:  Family/Significant Other Suicide Prevention Education  Suicide Prevention Education:  Education Completed; [name], mother, [phone] has been identified by the patient as the family member/significant other with whom the patient will be residing, and identified as the person(s) who will aid the patient in the event of a mental health crisis (suicidal ideations/suicide attempt).  With written consent from the patient, the family member/significant other has been provided the following suicide prevention education, prior to the and/or following the discharge of the patient.  The suicide prevention education provided includes the following: Suicide risk factors Suicide prevention and interventions National Suicide Hotline telephone number [facility] assessment telephone number [facility] Emergency Assistance [place] and/or Residential Mobile Crisis Unit telephone number  Request made of family/significant other to: Remove weapons (e.g., guns, rifles, knives), all items previously/currently identified as safety concern.   Remove drugs/medications (over-the-counter, prescriptions, illicit drugs), all items previously/currently identified as a safety concern.  The family member/significant other verbalizes understanding of the suicide prevention education information provided.  The family member/significant other agrees to remove the items of safety concern listed above.  Mother reports that the patient "took herself off that hydrocodone".  She reports that she went to an appointment with the patient to her psychiatrist "Dr. Erling Cruz told her that she can not take hydrocodone with her symptoms".  Mother reports that the patient later admitted to suicidal ideations and seeing a demon. Mother denies access to weapons.  Mother reports that patient is not a danger to others but may be danger to self due to suicidal ideations.    [name] 11/11/2021, 12:59 PM

## 2021-11-11 NOTE — BHH Suicide Risk Assessment (Signed)
[facility] Admission Suicide Risk Assessment   Nursing information obtained from:  Patient Demographic factors:  NA Current Mental Status:  Thoughts of violence towards others Loss Factors:  NA Historical Factors:  NA Risk Reduction Factors:  Sense of responsibility to family, Living with another person, especially a relative, Positive coping skills or problem solving skills  Total Time spent with patient: 45 minutes Principal Problem: Schizoaffective disorder ([place]) Diagnosis:  Principal Problem:   Schizoaffective disorder ([place]) Active Problems:   GERD   Morbid obesity with BMI of 40.0-44.9, adult ([place])   Abdominal wall seroma - chronic  Subjective Data: Patient seen and chart reviewed.  46 year old woman with a history of schizoaffective disorder transferred to [place] from [place].  She had been in the emergency room there for about a week after first presenting with hallucinations mood instability and some suicidal ideation.  Patient says she is feeling much better now.  Hallucinations are still present but much improved.  Anxiety is still present but decreased.  Denies acute suicidal intent or plan.  Cooperative and pleasant with treatment.  Continued Clinical Symptoms:    The "Alcohol Use Disorders Identification Test", Guidelines for Use in Primary Care, Second Edition.  World Pharmacologist [facility]). Score between 0-7:  no or low risk or alcohol related problems. Score between 8-15:  moderate risk of alcohol related problems. Score between 16-19:  high risk of alcohol related problems. Score 20 or above:  warrants further diagnostic evaluation for alcohol dependence and treatment.   CLINICAL FACTORS:   Depression:   Delusional Schizophrenia:   Command hallucinatons   Musculoskeletal: Strength & Muscle Tone: within normal limits Gait & Station: normal Patient leans: N/A  Psychiatric Specialty Exam:  Presentation  General Appearance: Appropriate for Environment  Eye  Contact:Good  Speech:Clear and Coherent  Speech Volume:Normal  Handedness:Right   Mood and Affect  Mood:Anxious  Affect:Blunt   Thought Process  Thought Processes:Linear  Descriptions of Associations:Circumstantial  Orientation:Full (Time, Place and Person)  Thought Content:Paranoid Ideation  History of Schizophrenia/Schizoaffective disorder:Yes  Duration of Psychotic Symptoms:Greater than six months  Hallucinations:No data recorded Ideas of Reference:Paranoia  Suicidal Thoughts:No data recorded Homicidal Thoughts:No data recorded  Sensorium  Memory:Immediate Fair; Recent Fair  Judgment:Impaired  Insight:Poor   Executive Functions  Concentration:Fair  Attention Span:Fair  P[place]on   Psychomotor Activity  Psychomotor Activity:No data recorded  Johnson; Resilience; Social Support   Sleep  Sleep:No data recorded   Physical Exam: Physical Exam Vitals and nursing note reviewed.  Constitutional:      Appearance: Normal appearance.  HENT:     Head: Normocephalic and atraumatic.     Mouth/Throat:     Pharynx: Oropharynx is clear.  Eyes:     Pupils: Pupils are equal, round, and reactive to light.  Cardiovascular:     Rate and Rhythm: Normal rate and regular rhythm.  Pulmonary:     Effort: Pulmonary effort is normal.     Breath sounds: Normal breath sounds.  Abdominal:     General: Abdomen is flat.     Palpations: Abdomen is soft.    Musculoskeletal:        General: Normal range of motion.  Skin:    General: Skin is warm and dry.  Neurological:     General: No focal deficit present.     Mental Status: She is alert. Mental status is at baseline.  Psychiatric:        Attention and Perception: Attention  normal. She perceives auditory hallucinations.        Mood and Affect: Mood normal.        Speech: Speech normal.        Behavior: Behavior normal.        Thought  Content: Thought content normal.        Cognition and Memory: Cognition normal.        Judgment: Judgment normal.    Review of Systems  Constitutional: Negative.   HENT: Negative.    Eyes: Negative.   Respiratory: Negative.    Cardiovascular: Negative.   Gastrointestinal: Negative.   Musculoskeletal: Negative.   Skin: Negative.   Neurological: Negative.   Psychiatric/Behavioral:  Positive for hallucinations. The patient is nervous/anxious.    Blood pressure (!) 146/104, pulse 91, temperature 97.8 F (36.6 C), temperature source Oral, resp. rate 16, height '5\' 7"'$  (1.702 m), weight 129.7 kg, SpO2 100 %. Body mass index is 44.79 kg/m.   COGNITIVE FEATURES THAT CONTRIBUTE TO RISK:  None    SUICIDE RISK:   Minimal: No identifiable suicidal ideation.  Patients presenting with no risk factors but with morbid ruminations; may be classified as minimal risk based on the severity of the depressive symptoms  PLAN OF CARE: Continue 15-minute checks.  Adjust medications as needed for symptoms.  Engage in individual and group therapy.  Manage wound appropriately.  Daily assessment of dangerousness prior to discharge planning  I certify that inpatient services furnished can reasonably be expected to improve the patient's condition.   Alethia Berthold, MD 11/11/2021, 11:55 AM

## 2021-11-11 NOTE — Group Note (Signed)
[facility] LCSW Group Therapy Note   Group Date: 11/11/2021 Start Time: 1300 End Time: 1400  Type of Therapy/Topic:  Group Therapy:  Feelings about Diagnosis  Participation Level:  Did Not Attend    Description of Group:    This group will allow patients to explore their thoughts and feelings about diagnoses they have received. Patients will be guided to explore their level of understanding and acceptance of these diagnoses. Facilitator will encourage patients to process their thoughts and feelings about the reactions of others to their diagnosis, and will guide patients in identifying ways to discuss their diagnosis with significant others in their lives. This group will be process-oriented, with patients participating in exploration of their own experiences as well as giving and receiving support and challenge from other group members.   Therapeutic Goals: 1. Patient will demonstrate understanding of diagnosis as evidence by identifying two or more symptoms of the disorder:  2. Patient will be able to express two feelings regarding the diagnosis 3. Patient will demonstrate ability to communicate their needs through discussion and/or role plays  Summary of Patient Progress: X   Therapeutic Modalities:   Cognitive Behavioral Therapy Brief Therapy Feelings Identification    Shirl Harris, LCSW

## 2021-11-11 NOTE — Plan of Care (Signed)
D: Patient alert and oriented. Patient rates pain 4/10 for chronic lower back pain. Patient endorses anxiety and depression rating both 8/10. Patient denies SI/HI/VH. Patient endorses [facility] stating that "little voices argues" with her.   A: Scheduled medications administered to patient, per MD orders.  Support and encouragement provided to patient.  Q15 minute safety checks maintained.   R: Patient compliant with medication administration and treatment plan. No adverse drug reactions noted. Patient remains safe on the unit at this time.  Problem: Education: Goal: Knowledge of [place] General Education information/materials will improve Outcome: Progressing Goal: Verbalization of understanding the information provided will improve Outcome: Progressing   Problem: Safety: Goal: Periods of time without injury will increase Outcome: Progressing   Problem: Education: Goal: Knowledge of the prescribed therapeutic regimen will improve Outcome: Progressing

## 2021-11-12 NOTE — Group Note (Signed)
[facility] LCSW Group Therapy Note   Group Date: 11/12/2021 Start Time: 1300 End Time: 1400   Type of Therapy/Topic:  Group Therapy:  Emotion Regulation  Participation Level:  Active    Description of Group:    The purpose of this group is to assist patients in learning to regulate negative emotions and experience positive emotions. Patients will be guided to discuss ways in which they have been vulnerable to their negative emotions. These vulnerabilities will be juxtaposed with experiences of positive emotions or situations, and patients challenged to use positive emotions to combat negative ones. Special emphasis will be placed on coping with negative emotions in conflict situations, and patients will process healthy conflict resolution skills.  Therapeutic Goals: Patient will identify two positive emotions or experiences to reflect on in order to balance out negative emotions:  Patient will label two or more emotions that they find the most difficult to experience:  Patient will be able to demonstrate positive conflict resolution skills through discussion or role plays:   Summary of Patient Progress: Patient was present for the majority of the group process. She shared that prior to coming into the hospital she was emotionally labile. Pt stated that she would be up/happy at one point and then sad/down the next. She stated that it is important to talk about managing emotions in order to keep from exploding. Pt acknowledged that oftentimes she ignores or bottles her emotions and they then come out as an explosion. She was open and receptive to comments/feedback from both peers and facilitator.    Therapeutic Modalities:   Cognitive Behavioral Therapy Feelings Identification Dialectical Behavioral Therapy   Melissa Harris, LCSW

## 2021-11-12 NOTE — Plan of Care (Signed)
  Problem: Education: Goal: Knowledge of General Education information will improve Description: Including pain rating scale, medication(s)/side effects and non-pharmacologic comfort measures Outcome: Progressing   Problem: Health Behavior/Discharge Planning: Goal: Ability to manage health-related needs will improve Outcome: Progressing   Problem: Clinical Measurements: Goal: Will remain free from infection Outcome: Progressing   Problem: Nutrition: Goal: Adequate nutrition will be maintained Outcome: Progressing   Problem: Coping: Goal: Level of anxiety will decrease Outcome: Progressing   Problem: Education: Goal: Emotional status will improve Outcome: Progressing   Problem: Education: Goal: Mental status will improve Outcome: Progressing

## 2021-11-12 NOTE — Progress Notes (Signed)
Pt took a shower. Wound care provided. Wet to dry dressing changed and covered with ABD pad. New tape applied. Red and no drainage. Pt complains of no pain.

## 2021-11-12 NOTE — Plan of Care (Signed)
  Problem: Education: Goal: Knowledge of General Education information will improve Description: Including pain rating scale, medication(s)/side effects and non-pharmacologic comfort measures Outcome: Progressing   Problem: Health Behavior/Discharge Planning: Goal: Ability to manage health-related needs will improve Outcome: Progressing   Problem: Clinical Measurements: Goal: Ability to maintain clinical measurements within normal limits will improve Outcome: Progressing Goal: Will remain free from infection Outcome: Progressing Goal: Diagnostic test results will improve Outcome: Progressing Goal: Respiratory complications will improve Outcome: Progressing Goal: Cardiovascular complication will be avoided Outcome: Progressing   Problem: Self-Concept: Goal: Will verbalize positive feelings about self Outcome: Progressing   Problem: Self-Care: Goal: Ability to participate in self-care as condition permits will improve Outcome: Progressing   Problem: Safety: Goal: Ability to redirect hostility and anger into socially appropriate behaviors will improve Outcome: Progressing Goal: Ability to remain free from injury will improve Outcome: Progressing   Problem: Role Relationship: Goal: Ability to communicate needs accurately will improve Outcome: Progressing Goal: Ability to interact with others will improve Outcome: Progressing   Problem: Nutritional: Goal: Ability to achieve adequate nutritional intake will improve Outcome: Progressing

## 2021-11-12 NOTE — Progress Notes (Signed)
Patient is pleasant and cooperative.  Active on the unit hanging in the dayroom with her peers this evening.  She continues to endorse audio hallucinations, but reports they are much better and not as loud. She is med compliant and took her qhs meds without incident. She denies si hi vh and pain at this encounter. Still reporting some anxiety and depression, but reports that too is decreasing.  Support and encouragement offered.  Will continue to monitor with q 15 minute safety checks.    C Butler-Nicholson, LPN

## 2021-11-12 NOTE — Progress Notes (Signed)
Pt denies SI/HI/VH but endorses AH and verbally agrees to approach staff if these become apparent or before harming themselves/others. Rates depression 0/10. Rates anxiety 7/10. Rates pain 4/10. Pt states that she hears voices that "are mocking me or saying the opposite of things going on." Pt states that they are mild and are not telling her to hurt herself or others. Pt took a shower and dressing was changed. Pt is on top of her meds. Pt has complained of back, knee, and elbow pain throughout the day. Pt seemed to be more lethargic this morning but was out more the rest of the day. Pt stated that she did not want to be tired all day. Pt was outside and later told writer that she didn't want to leave outside since she was so relaxed. Pt had an accident but was able to clean up herself. No scrubs were given. Scheduled medications administered to pt, per MD orders. RN provided support and encouragement to pt. Q15 min safety checks implemented and continued. Pt safe on the unit. RN will continue to monitor and intervene as needed.  Problem: Clinical Measurements: Goal: Will remain free from infection Outcome: Progressing   Problem: Nutrition: Goal: Adequate nutrition will be maintained Outcome: Progressing    11/12/21 0816  Psych Admission Type (Psych Patients Only)  Admission Status Involuntary  Psychosocial Assessment  Patient Complaints Anxiety  Eye Contact Fair  Facial Expression Other (Comment) (WDL)  Affect Anxious  Speech Logical/coherent  Interaction Assertive;Guarded  Motor Activity Other (Comment) (WDL)  Appearance/Hygiene Unremarkable;In scrubs  Behavior Characteristics Cooperative;Appropriate to situation;Anxious  Mood Anxious;Pleasant  Thought Process  Coherency WDL  Content Phobias  Delusions None reported or observed  Perception Hallucinations  Hallucination Auditory  Judgment Impaired  Confusion None  Danger to Self  Current suicidal ideation? Denies  Danger to Others   Danger to Others None reported or observed

## 2021-11-12 NOTE — Progress Notes (Signed)
Patient is pleasant and cooperative. She is active on the unit and spends a great deal of time in the day room. She is very engaging with the other patients on  the unit.  She denies si  hi vh at this encounter.  She does continue to endorse anxiety and depression and continues  to hear voices. Reporting that the voices are better less intrusive.  Will continue to monitor with q15 safety checks.       C Butler-Nicholson, LPN

## 2021-11-12 NOTE — Progress Notes (Signed)
[facility] MD Progress Note  11/12/2021 10:53 AM [name]  MRN:  [id number] Subjective: Patient seen and chart reviewed.  Patient also attended treatment team.  46 year old woman with schizoaffective disorder.  Mood gradually improving.  Still nervous and dysphoric.  Some hallucinations but improved.  Sleeping a little better.  Still has nightmares at times.  Trying to catch up on rest.  Very appropriate and communicative in treatment team able to discuss appropriate plans for the future. Principal Problem: Schizoaffective disorder ([place]) Diagnosis: Principal Problem:   Schizoaffective disorder ([place]) Active Problems:   GERD   Morbid obesity with BMI of 40.0-44.9, adult (HCC)   Abdominal wall seroma - chronic  Total Time spent with patient: 30 minutes  Past Psychiatric History: Past history of schizoaffective disorder.  Past Medical History:  Past Medical History:  Diagnosis Date   ADHD    Anal fistula    Anxiety    Arthritis    pt denies 5/3 visit    Bipolar affective ([place])    Blood transfusion without reported diagnosis    Colostomy in place [facility])    CVA (cerebrovascular accident) ([place])    left cerebellar infarct found accidentally on CT (2020)    Depression    Diverticulitis 2008   perforated/ requiring resection   Fatty liver    Gallstones    GERD (gastroesophageal reflux disease)    occ.   Headache    after a fall   Herpes simplex    History of colon polyps    History of hiatal hernia    small   History of kidney stones    cystoscopy  basket removal   Hypertension    Iron deficiency anemia    Obese    OCD (obsessive compulsive disorder)    Panic attacks    PONV (postoperative nausea and vomiting) 10/04/2020   Pulmonary nodules    Sarcoidosis    Schizophrenia ([place])    Shortness of breath dyspnea    pt denies on 5/3 visit    Sleep apnea    mild, does not use c-pap machine   Substance abuse ([place])    12 years ago-crack cocaine    Past Surgical History:   Procedure Laterality Date   Abdominal wall hernia  07/2007   open repair with lysis of adhesions   APPLICATION OF WOUND VAC N/A 06/29/2019   Procedure: PLACEMENT OF WOUND VAC;  Surgeon: Greer Pickerel, MD;  Location: [facility];  Service: General;  Laterality: N/A;   BRONCHIAL BRUSHINGS  04/11/2019   Procedure: BRONCHIAL BRUSHINGS;  Surgeon: Collene Gobble, MD;  Location: [facility] ENDOSCOPY;  Service: Pulmonary;;   BRONCHIAL WASHINGS  04/11/2019   Procedure: BRONCHIAL WASHINGS;  Surgeon: Collene Gobble, MD;  Location: [facility] ENDOSCOPY;  Service: Pulmonary;;   COLONOSCOPY  03/13/2010   PNT:[id number] hemorrhoids likely cause of hematochezia, otherwise normal   COLONOSCOPY  05/24/2019   COLONOSCOPY WITH ESOPHAGOGASTRODUODENOSCOPY (EGD)  04/2019   Colostomy reversal  11/2006   DEBRIDEMENT OF ABDOMINAL WALL ABSCESS N/A 06/29/2019   Procedure: EXCISIONAL DEBRIDEMENT OF ABDOMINAL WALL/SUBCU SEROMA;  Surgeon: Greer Pickerel, MD;  Location: [facility];  Service: General;  Laterality: N/A;   ESOPHAGOGASTRODUODENOSCOPY  03/13/2010   MGQ:[id number] esophagus, status post passage of a Maloney dilator/Small hiatal hernia/ Antral erosions, status post biopsy   EXAMINATION UNDER ANESTHESIA N/A 05/11/2012   Procedure: EXAM UNDER ANESTHESIA;  Surgeon: Donato Heinz, MD;  Location: [facility];  Service: General;  Laterality: N/A;   Exploratory laparotomy with resection  2008   colonoscopy   FINE NEEDLE ASPIRATION  04/11/2019   Procedure: FINE NEEDLE ASPIRATION (FNA) LINEAR;  Surgeon: Collene Gobble, MD;  Location: [facility] ENDOSCOPY;  Service: Pulmonary;;   FINGER CLOSED REDUCTION Right 08/05/2012   Procedure: CLOSED REDUCTION RIGHT THUMB (FINGER);  Surgeon: Linna Hoff, MD;  Location: [facility];  Service: Orthopedics;  Laterality: Right;   HERNIA REPAIR     incisional hernia surgery    Hx of abd wall seroma  08/2007   Drained via [place] in [place], [place]   Hx of abd wall seroma  10/2007   Drained by Dr. Geroge Baseman in office   Hermosa N/A 06/29/2019   Procedure: INCISION AND DRAINAGE;  Surgeon: Greer Pickerel, MD;  Location: [facility];  Service: General;  Laterality: N/A;   IR RADIOLOGIST EVAL & MGMT  03/15/2019   IR RADIOLOGIST EVAL & MGMT  04/06/2019   Kidney stones     LAPAROSCOPIC GASTRIC SLEEVE RESECTION N/A 02/13/2014   Procedure: LAPAROSCOPIC GASTRIC SLEEVE RESECTION LYSIS OF ADHESIONS, UPPER ENDOSCOPY;  Surgeon: [name], MD;  Location: [facility];  Service: General;  Laterality: N/A;   LAPAROSCOPY N/A 09/28/2014   Procedure: LAPAROSCOPY DIAGNOSTIC, INCISION AND DRAINAGE WITH LAPAROSCOPIC EXPLORATION OF ABDOMINAL WALL SEROMA with ultrasound;  Surgeon: Greer Pickerel, MD;  Location: [facility];  Service: General;  Laterality: N/A;   LUNG BIOPSY  04/11/2019   Procedure: LUNG BIOPSY;  Surgeon: Collene Gobble, MD;  Location: [facility] ENDOSCOPY;  Service: Pulmonary;;  distal trachea   PLACEMENT OF SETON N/A 05/11/2012   Procedure: PLACEMENT OF SETON;  Surgeon: Donato Heinz, MD;  Location: [facility];  Service: General;  Laterality: N/A;   root canal 7-16     TONSILLECTOMY     TREATMENT FISTULA ANAL     x 2   UPPER GASTROINTESTINAL ENDOSCOPY  05/24/2019   VIDEO BRONCHOSCOPY WITH ENDOBRONCHIAL ULTRASOUND N/A 04/11/2019   Procedure: VIDEO BRONCHOSCOPY WITH ENDOBRONCHIAL ULTRASOUND;  Surgeon: Collene Gobble, MD;  Location: [facility] ENDOSCOPY;  Service: Pulmonary;  Laterality: N/A;   Family History:  Family History  Problem Relation Age of Onset   Asthma Maternal Grandmother    Colon polyps Maternal Grandmother    Colon cancer Maternal Grandmother    Esophageal cancer Neg Hx    Rectal cancer Neg Hx    Stomach cancer Neg Hx    Pancreatic cancer Neg Hx    Liver disease Neg Hx    Family Psychiatric  History: See previous Social History:  Social History   Substance and Sexual Activity  Alcohol Use No   Alcohol/week: 0.0 standard drinks of alcohol     Social History   Substance and Sexual Activity  Drug Use Never     Social History   Socioeconomic History   Marital status: Single    Spouse name: Not on file   Number of children: 2   Years of education: 12    Highest education level: Not on file  Occupational History   Occupation: Insurance claims handler: UNEMPLOYED  Tobacco Use   Smoking status: Former    Packs/day: 0.50    Years: 14.00    Total pack years: 7.00    Types: E-cigarettes, Cigarettes    Quit date: 12/20/2013    Years since quitting: 7.9   Smokeless tobacco: Never   Tobacco comments:    Vapor cigarettes.  Vaping Use   Vaping Use: Every day   Last attempt to quit:  02/24/2019   Substances: Nicotine, Flavoring   Devices: started 2015  Substance and Sexual Activity   Alcohol use: No    Alcohol/week: 0.0 standard drinks of alcohol   Drug use: Never   Sexual activity: Not Currently    Birth control/protection: Abstinence  Other Topics Concern   Not on file  Social History Narrative   2 children   Social Determinants of Health   Financial Resource Strain: Low Risk  (12/19/2020)   Overall Financial Resource Strain (CARDIA)    Difficulty of Paying Living Expenses: Not hard at all  Food Insecurity: No Food Insecurity (11/11/2021)   Hunger Vital Sign    Worried About Running Out of Food in the Last Year: Never true    Ran Out of Food in the Last Year: Never true  Transportation Needs: No Transportation Needs (11/11/2021)   PRAPARE - Hydrologist (Medical): No    Lack of Transportation (Non-Medical): No  Physical Activity: Inactive (12/19/2020)   Exercise Vital Sign    Days of Exercise per Week: 0 days    Minutes of Exercise per Session: 0 min  Stress: No Stress Concern Present (12/19/2020)   [place]    Feeling of Stress : Not at all  Social Connections: Moderately Integrated (12/19/2020)   Social Connection and Isolation Panel [NHANES]    Frequency of Communication with  Friends and Family: More than three times a week    Frequency of Social Gatherings with Friends and Family: More than three times a week    Attends Religious Services: 1 to 4 times per year    Active Member of [employer] or Organizations: Yes    Attends Archivist Meetings: 1 to 4 times per year    Marital Status: Never married   Additional Social History:                         Sleep: Fair  Appetite:  Fair  Current Medications: Current Facility-Administered Medications  Medication Dose Route Frequency Provider Last Rate Last Admin   acetaminophen (TYLENOL) tablet 650 mg  650 mg Oral Q6H PRN Etan Vasudevan T, MD   650 mg at 11/12/21 0816   alum & mag hydroxide-simeth (MAALOX/MYLANTA) 200-200-20 MG/5ML suspension 30 mL  30 mL Oral Q4H PRN Ashlye Oviedo T, MD       benztropine (COGENTIN) tablet 1 mg  1 mg Oral Q8H PRN Auburn Hester, [name], MD   1 mg at 11/11/21 0834   docusate sodium (COLACE) capsule 100 mg  100 mg Oral Daily Larrissa Stivers, [name], MD   100 mg at 11/12/21 1610   gabapentin (NEURONTIN) capsule 600 mg  600 mg Oral TID Roseana Rhine T, MD   600 mg at 11/12/21 0815   haloperidol (HALDOL) tablet 5 mg  5 mg Oral BID Humberto Addo, [name], MD   5 mg at 11/12/21 0814   lisinopril (ZESTRIL) tablet 20 mg  20 mg Oral Daily Neviah Braud, [name], MD   20 mg at 11/12/21 9604   lithium carbonate capsule 600 mg  600 mg Oral BID Magdaline Zollars T, MD   600 mg at 11/12/21 0814   LORazepam (ATIVAN) tablet 1 mg  1 mg Oral Q1500 Sherrin Stahle, [name], MD   1 mg at 11/11/21 1502   magnesium hydroxide (MILK OF MAGNESIA) suspension 30 mL  30 mL Oral Daily PRN Shafiq Larch, [name], MD  polyethylene glycol (MIRALAX / GLYCOLAX) packet 17 g  17 g Oral Daily PRN Charrisse Masley T, MD       QUEtiapine (SEROQUEL) tablet 800 mg  800 mg Oral QHS Deanza Upperman T, MD   800 mg at 11/11/21 2104   senna-docusate (Senokot-S) tablet 1 tablet  1 tablet Oral QHS PRN Kyliyah Stirn, [name], MD       valACYclovir (VALTREX) tablet 500 mg   500 mg Oral BID Leam Madero T, MD   500 mg at 11/12/21 0815   ziprasidone (GEODON) capsule 60 mg  60 mg Oral BID Dynesha Woolen, [name], MD   60 mg at 11/12/21 4496    Lab Results:  Results for orders placed or performed during the hospital encounter of 11/03/21 (from the past 48 hour(s))  SARS Coronavirus 2 by RT PCR (hospital order, performed in [facility] hospital lab) *cepheid single result test* Anterior Nasal Swab     Status: None   Collection Time: 11/10/21 11:35 PM   Specimen: Anterior Nasal Swab  Result Value Ref Range   SARS Coronavirus 2 by RT PCR NEGATIVE NEGATIVE    Comment: (NOTE) SARS-CoV-2 target nucleic acids are NOT DETECTED.  The SARS-CoV-2 RNA is generally detectable in upper and lower respiratory specimens during the acute phase of infection. The lowest concentration of SARS-CoV-2 viral copies this assay can detect is 250 copies / mL. A negative result does not preclude SARS-CoV-2 infection and should not be used as the sole basis for treatment or other patient management decisions.  A negative result may occur with improper specimen collection / handling, submission of specimen other than nasopharyngeal swab, presence of viral mutation(s) within the areas targeted by this assay, and inadequate number of viral copies (<250 copies / mL). A negative result must be combined with clinical observations, patient history, and epidemiological information.  Fact Sheet for Patients:   https://www.patel.info/  Fact Sheet for Healthcare Providers: https://hall.com/  This test is not yet approved or  cleared by the [place] FDA and has been authorized for detection and/or diagnosis of SARS-CoV-2 by FDA under an Emergency Use Authorization (EUA).  This EUA will remain in effect (meaning this test can be used) for the duration of the COVID-19 declaration under Section 564(b)(1) of the Act, 21 U.S.C. section 360bbb-3(b)(1), unless  the authorization is terminated or revoked sooner.  Performed at [facility], [address] [place], [place] [address]     Blood Alcohol level:  Lab Results  Component Value Date   [facility] <10 04/14/2021   ETH <10 38/46/6599    Metabolic Disorder Labs: Lab Results  Component Value Date   HGBA1C 5.2 08/16/2020   MPG 103 08/16/2020   No results found for: "PROLACTIN" Lab Results  Component Value Date   CHOL 196 03/04/2021   TRIG 115 03/04/2021   HDL 64 03/04/2021   CHOLHDL 3.1 03/04/2021   VLDL 33 08/02/2013   LDLCALC 112 (H) 03/04/2021   LDLCALC 107 (H) 01/09/2019    Physical Findings: AIMS: Facial and Oral Movements Muscles of Facial Expression: None, normal Lips and Perioral Area: None, normal Jaw: None, normal Tongue: None, normal,Extremity Movements Upper (arms, wrists, hands, fingers): None, normal Lower (legs, knees, ankles, toes): None, normal, Trunk Movements Neck, shoulders, hips: None, normal, Overall Severity Severity of abnormal movements (highest score from questions above): None, normal Incapacitation due to abnormal movements: None, normal Patient's awareness of abnormal movements (rate only patient's report): No Awareness, Dental Status Current problems with teeth and/or  dentures?: No Does patient usually wear dentures?: No  CIWA:    COWS:     Musculoskeletal: Strength & Muscle Tone: within normal limits Gait & Station: normal Patient leans: N/A  Psychiatric Specialty Exam:  Presentation  General Appearance: Appropriate for Environment  Eye Contact:Good  Speech:Clear and Coherent  Speech Volume:Normal  Handedness:Right   Mood and Affect  Mood:Anxious  Affect:Blunt   Thought Process  Thought Processes:Linear  Descriptions of Associations:Circumstantial  Orientation:Full (Time, Place and Person)  Thought Content:Paranoid Ideation  History of Schizophrenia/Schizoaffective disorder:Yes  Duration of Psychotic  Symptoms:Greater than six months  Hallucinations:No data recorded Ideas of Reference:Paranoia  Suicidal Thoughts:No data recorded Homicidal Thoughts:No data recorded  Sensorium  Memory:Immediate Fair; Recent Fair  Judgment:Impaired  Insight:Poor   Executive Functions  Concentration:Fair  Attention Span:Fair  [place]   Psychomotor Activity  Psychomotor Activity:No data recorded  [place]; Resilience; Social Support   Sleep  Sleep:No data recorded   Physical Exam: Physical Exam Vitals and nursing note reviewed.  Constitutional:      Appearance: Normal appearance.  HENT:     Head: Normocephalic and atraumatic.     Mouth/Throat:     Pharynx: Oropharynx is clear.  Eyes:     Pupils: Pupils are equal, round, and reactive to light.  Cardiovascular:     Rate and Rhythm: Normal rate and regular rhythm.  Pulmonary:     Effort: Pulmonary effort is normal.     Breath sounds: Normal breath sounds.  Abdominal:     General: Abdomen is flat.     Palpations: Abdomen is soft.  Musculoskeletal:        General: Normal range of motion.  Skin:    General: Skin is warm and dry.  Neurological:     General: No focal deficit present.     Mental Status: She is alert. Mental status is at baseline.  Psychiatric:        Attention and Perception: Attention normal. She perceives auditory hallucinations.        Mood and Affect: Mood is anxious and depressed.        Speech: Speech normal.        Behavior: Behavior is cooperative.        Thought Content: Thought content normal.        Cognition and Memory: Cognition normal.    Review of Systems  Constitutional: Negative.   HENT: Negative.    Eyes: Negative.   Respiratory: Negative.    Cardiovascular: Negative.   Gastrointestinal: Negative.   Musculoskeletal: Negative.   Skin: Negative.   Neurological: Negative.   Psychiatric/Behavioral:  Positive for  depression and hallucinations. The patient is nervous/anxious.    Blood pressure 128/77, pulse 93, temperature 97.7 F (36.5 C), temperature source Oral, resp. rate 16, height '5\' 7"'$  (1.702 m), weight 129.7 kg, SpO2 100 %. Body mass index is 44.79 kg/m.   Treatment Plan Summary: Medication management and Plan no change to medication for today.  Supportive counseling and therapy.  Reviewed treatment plan in meeting today.  Encourage group attendance when she is up and out of bed.  Alethia Berthold, MD 11/12/2021, 10:53 AM

## 2021-11-12 NOTE — BH IP Treatment Plan (Signed)
Interdisciplinary Treatment and Diagnostic Plan Update  11/12/2021 Time of Session: 09:42 [name] MRN: [id number]  Principal Diagnosis: Schizoaffective disorder [facility])  Secondary Diagnoses: Principal Problem:   Schizoaffective disorder ([place]) Active Problems:   GERD   Morbid obesity with BMI of 40.0-44.9, adult (HCC)   Abdominal wall seroma - chronic   Current Medications:  Current Facility-Administered Medications  Medication Dose Route Frequency Provider Last Rate Last Admin   acetaminophen (TYLENOL) tablet 650 mg  650 mg Oral Q6H PRN Clapacs, John T, MD   650 mg at 11/12/21 0816   alum & mag hydroxide-simeth (MAALOX/MYLANTA) 200-200-20 MG/5ML suspension 30 mL  30 mL Oral Q4H PRN Clapacs, John T, MD       benztropine (COGENTIN) tablet 1 mg  1 mg Oral Q8H PRN Clapacs, [name], MD   1 mg at 11/11/21 0834   docusate sodium (COLACE) capsule 100 mg  100 mg Oral Daily Clapacs, John T, MD   100 mg at 11/12/21 7939   gabapentin (NEURONTIN) capsule 600 mg  600 mg Oral TID Clapacs, John T, MD   600 mg at 11/12/21 1134   haloperidol (HALDOL) tablet 5 mg  5 mg Oral BID Clapacs, [name], MD   5 mg at 11/12/21 0814   lisinopril (ZESTRIL) tablet 20 mg  20 mg Oral Daily Clapacs, [name], MD   20 mg at 11/12/21 0300   lithium carbonate capsule 600 mg  600 mg Oral BID Clapacs, John T, MD   600 mg at 11/12/21 0814   LORazepam (ATIVAN) tablet 1 mg  1 mg Oral Q1500 Clapacs, John T, MD   1 mg at 11/12/21 1407   magnesium hydroxide (MILK OF MAGNESIA) suspension 30 mL  30 mL Oral Daily PRN Clapacs, John T, MD       polyethylene glycol (MIRALAX / GLYCOLAX) packet 17 g  17 g Oral Daily PRN Clapacs, John T, MD       QUEtiapine (SEROQUEL) tablet 800 mg  800 mg Oral QHS Clapacs, John T, MD   800 mg at 11/11/21 2104   senna-docusate (Senokot-S) tablet 1 tablet  1 tablet Oral QHS PRN Clapacs, [name], MD       valACYclovir (VALTREX) tablet 500 mg  500 mg Oral BID Clapacs, John T, MD   500 mg at 11/12/21 0815    ziprasidone (GEODON) capsule 60 mg  60 mg Oral BID Clapacs, John T, MD   60 mg at 11/12/21 9233   PTA Medications: Medications Prior to Admission  Medication Sig Dispense Refill Last Dose   gabapentin (NEURONTIN) 600 MG tablet Take 600 mg by mouth 3 (three) times daily.   Past Week   levETIRAcetam (KEPPRA) 250 MG tablet Take 250 mg by mouth 2 (two) times daily.   Past Week   lisinopril (ZESTRIL) 20 MG tablet Take 1 tablet (20 mg total) by mouth daily. 90 tablet 3 Past Week   lithium carbonate 300 MG capsule Take 300 mg by mouth 3 (three) times daily.   Past Week   QUEtiapine (SEROQUEL) 300 MG tablet Take 800 mg by mouth at bedtime.   Past Week   ziprasidone (GEODON) 60 MG capsule Take 60 mg by mouth 2 (two) times daily.   Past Week   albuterol (VENTOLIN HFA) 108 (90 Base) MCG/ACT inhaler Inhale 2 puffs into the lungs every 6 (six) hours as needed for wheezing or shortness of breath. (Patient not taking: Reported on 11/03/2021) 8 g 6    doxycycline (  VIBRAMYCIN) 100 MG capsule Take 1 capsule (100 mg total) by mouth 2 (two) times daily. (Patient not taking: Reported on 11/03/2021) 20 capsule 0    HYDROcodone-acetaminophen (NORCO) 10-325 MG tablet Take 1 tablet by mouth 3 (three) times daily. (Patient not taking: Reported on 11/03/2021)      MIRENA, 52 MG, 20 MCG/DAY IUD once.      Promethazine-DM 6.25-15 MG/5ML SOLN Take 5 mLs by mouth every 6 (six) hours as needed (Cough). (Patient not taking: Reported on 11/03/2021) 240 mL 0    Suvorexant 20 MG TABS Take 20 mg by mouth at bedtime. (Patient not taking: Reported on 11/03/2021)      valACYclovir (VALTREX) 500 MG tablet TAKE 1 TABLET (500 MG TOTAL) BY MOUTH DAILY AS NEEDED (FEVER BLISTER). 90 tablet 2 prn    Patient Stressors: Health problems   Medication change or noncompliance    Patient Strengths: Engineer, site for treatment/growth   Treatment Modalities: Medication Management, Group therapy, Case  management,  1 to 1 session with clinician, Psychoeducation, Recreational therapy.   Physician Treatment Plan for Primary Diagnosis: Schizoaffective disorder ([name]) Long Term Goal(s): Improvement in symptoms so as ready for discharge   Short Term Goals: Compliance with prescribed medications will improve Ability to verbalize feelings will improve Ability to disclose and discuss suicidal ideas Ability to demonstrate self-control will improve  Medication Management: Evaluate patient's response, side effects, and tolerance of medication regimen.  Therapeutic Interventions: 1 to 1 sessions, Unit Group sessions and Medication administration.  Evaluation of Outcomes: Progressing  Physician Treatment Plan for Secondary Diagnosis: Principal Problem:   Schizoaffective disorder ([name]) Active Problems:   GERD   Morbid obesity with BMI of 40.0-44.9, adult (HCC)   Abdominal wall seroma - chronic  Long Term Goal(s): Improvement in symptoms so as ready for discharge   Short Term Goals: Compliance with prescribed medications will improve Ability to verbalize feelings will improve Ability to disclose and discuss suicidal ideas Ability to demonstrate self-control will improve     Medication Management: Evaluate patient's response, side effects, and tolerance of medication regimen.  Therapeutic Interventions: 1 to 1 sessions, Unit Group sessions and Medication administration.  Evaluation of Outcomes: Progressing   RN Treatment Plan for Primary Diagnosis: Schizoaffective disorder ([name]) Long Term Goal(s): Knowledge of disease and therapeutic regimen to maintain health will improve  Short Term Goals: Ability to remain free from injury will improve, Ability to verbalize frustration and anger appropriately will improve, Ability to demonstrate self-control, Ability to participate in decision making will improve, Ability to verbalize feelings will improve, Ability to disclose and discuss suicidal  ideas, Ability to identify and develop effective coping behaviors will improve, and Compliance with prescribed medications will improve  Medication Management: RN will administer medications as ordered by provider, will assess and evaluate patient's response and provide education to patient for prescribed medication. RN will report any adverse and/or side effects to prescribing provider.  Therapeutic Interventions: 1 on 1 counseling sessions, Psychoeducation, Medication administration, Evaluate responses to treatment, Monitor vital signs and CBGs as ordered, Perform/monitor CIWA, COWS, AIMS and Fall Risk screenings as ordered, Perform wound care treatments as ordered.  Evaluation of Outcomes: Progressing   LCSW Treatment Plan for Primary Diagnosis: Schizoaffective disorder ([place]) Long Term Goal(s): Safe transition to appropriate next level of care at discharge, Engage patient in therapeutic group addressing interpersonal concerns.  Short Term Goals: Engage patient in aftercare planning with referrals and resources, Increase social support, Increase ability  to appropriately verbalize feelings, Increase emotional regulation, Facilitate acceptance of mental health diagnosis and concerns, Facilitate patient progression through stages of change regarding substance use diagnoses and concerns, Identify triggers associated with mental health/substance abuse issues, and Increase skills for wellness and recovery  Therapeutic Interventions: Assess for all discharge needs, 1 to 1 time with Social worker, Explore available resources and support systems, Assess for adequacy in community support network, Educate family and significant other(s) on suicide prevention, Complete Psychosocial Assessment, Interpersonal group therapy.  Evaluation of Outcomes: Progressing   Progress in Treatment: Attending groups: Yes. Participating in groups: Yes. Taking medication as prescribed: Yes. Toleration medication:  Yes. Family/Significant other contact made: Yes, individual(s) contacted:  mother, [name]. Patient understands diagnosis: Yes. Discussing patient identified problems/goals with staff: Yes. Medical problems stabilized or resolved: Yes. Denies suicidal/homicidal ideation: Yes. Issues/concerns per patient self-inventory: No. Other: none.  New problem(s) identified: No, Describe:  none identified  New Short Term/Long Term Goal(s): detox, medication management for mood stabilization; elimination of SI thoughts; development of comprehensive mental wellness/sobriety plan.  Patient Goals:  "I want to be able to rest and heal my mind some with the medication."  Discharge Plan or Barriers: CSW will assist pt with development of an appropriate aftercare/discharge plan.  Reason for Continuation of Hospitalization: Anxiety Hallucinations Medication stabilization Suicidal ideation  Estimated Length of Stay: 1-7 days  Last 3 [place] Suicide Severity Risk Score: Flowsheet Row Admission (Current) from 11/11/2021 in [place] [facility] from 11/03/2021 in [place] [facility] from 10/19/2021 in [place] No Risk No Risk No Risk       Last PHQ 2/9 Scores:    11/03/2021    5:06 PM 04/15/2021    3:31 AM 03/04/2021    9:37 AM  Depression screen PHQ 2/9  Decreased Interest '1 1 1  '$ Down, Depressed, Hopeless '1 1 1  '$ PHQ - 2 Score '2 2 2  '$ Altered sleeping  2 3  Tired, decreased energy '3 1 1  '$ Change in appetite '1 1 3  '$ Feeling bad or failure about yourself  '1 2 2  '$ Trouble concentrating '2 2 1  '$ Moving slowly or fidgety/restless 0 0 0  Suicidal thoughts 0 0 0  PHQ-9 Score  10 12  Difficult doing work/chores Somewhat difficult Somewhat difficult Somewhat difficult    Scribe for Treatment Team: Shirl Harris, Marlinda Mike 11/12/2021 2:39 PM

## 2021-11-13 MED ORDER — VALACYCLOVIR HCL 500 MG PO TABS: 500.0000 mg | ORAL_TABLET | Freq: Every day | ORAL | 1 refills | Status: AC | PRN

## 2021-11-13 MED ORDER — POLYETHYLENE GLYCOL 3350 17 G PO PACK: 17.0000 g | PACK | Freq: Every day | ORAL | 1 refills | Status: DC | PRN

## 2021-11-13 MED ORDER — LISINOPRIL 20 MG PO TABS: 20.0000 mg | ORAL_TABLET | Freq: Every day | ORAL | 1 refills | Status: AC

## 2021-11-13 MED ORDER — LORAZEPAM 1 MG PO TABS: 1.0000 mg | ORAL_TABLET | Freq: Every day | ORAL | 1 refills | Status: DC

## 2021-11-13 MED ORDER — DOCUSATE SODIUM 100 MG PO CAPS: 100.0000 mg | ORAL_CAPSULE | Freq: Every day | ORAL | 1 refills | Status: AC

## 2021-11-13 MED ORDER — QUETIAPINE FUMARATE 400 MG PO TABS: 800.0000 mg | ORAL_TABLET | Freq: Every day | ORAL | 1 refills | Status: AC

## 2021-11-13 MED ORDER — LITHIUM CARBONATE 600 MG PO CAPS: 600.0000 mg | ORAL_CAPSULE | Freq: Two times a day (BID) | ORAL | 1 refills | Status: AC

## 2021-11-13 MED ORDER — ZIPRASIDONE HCL 60 MG PO CAPS: 60.0000 mg | ORAL_CAPSULE | Freq: Two times a day (BID) | ORAL | 1 refills | Status: AC

## 2021-11-13 MED ORDER — GABAPENTIN 300 MG PO CAPS: 600.0000 mg | ORAL_CAPSULE | Freq: Three times a day (TID) | ORAL | 1 refills | Status: AC

## 2021-11-13 MED ORDER — ALBUTEROL SULFATE HFA 108 (90 BASE) MCG/ACT IN AERS: 2.0000 | INHALATION_SPRAY | Freq: Four times a day (QID) | RESPIRATORY_TRACT | 1 refills | Status: AC | PRN

## 2021-11-13 MED ORDER — HALOPERIDOL 5 MG PO TABS: 5.0000 mg | ORAL_TABLET | Freq: Two times a day (BID) | ORAL | 1 refills | Status: AC

## 2021-11-13 NOTE — Progress Notes (Signed)
Patient is in a pleasant mood.  Active on the unit sitting in the dayroom watching TV and reading a magazine upon my approach. Reports being ready for discharge tomorrow. Denies si  hi  vh and depression.  She continue to endorse AH but states the voices are much better and under control. She has mild pain this evening that she is currently rating at 2/10 and in no need for any pain medication at this time. Continues to endorse anxiety, but states that will always be the case. Support and encouragement offered. Will continue to monitor with q15 minute safety checks.    C Butler-Nicholson, LPN

## 2021-11-13 NOTE — Progress Notes (Signed)
Recreation Therapy Notes  INPATIENT RECREATION TR PLAN  Patient Details Name: [name] MRN: [id number] DOB: 12/10/1975 Today's Date: 11/13/2021  Rec Therapy Plan Is patient appropriate for Therapeutic Recreation?: Yes Treatment times per week: at least 3 Estimated Length of Stay: 5-7 days TR Treatment/Interventions: Group participation (Comment)  Discharge Criteria Pt will be discharged from therapy if:: Discharged Treatment plan/goals/alternatives discussed and agreed upon by:: Patient/family  Discharge Summary     Chryl Holten 11/13/2021, 3:16 PM

## 2021-11-13 NOTE — Group Note (Signed)
[facility] LCSW Group Therapy Note   Group Date: 11/13/2021 Start Time: 1300 End Time: 1400   Type of Therapy/Topic:  Group Therapy:  Balance in Life  Participation Level:  None   Description of Group:    This group will address the concept of balance and how it feels and looks when one is unbalanced. Patients will be encouraged to process areas in their lives that are out of balance, and identify reasons for remaining unbalanced. Facilitators will guide patients utilizing problem- solving interventions to address and correct the stressor making their life unbalanced. Understanding and applying boundaries will be explored and addressed for obtaining  and maintaining a balanced life. Patients will be encouraged to explore ways to assertively make their unbalanced needs known to significant others in their lives, using other group members and facilitator for support and feedback.  Therapeutic Goals: Patient will identify two or more emotions or situations they have that consume much of in their lives. Patient will identify signs/triggers that life has become out of balance:  Patient will identify two ways to set boundaries in order to achieve balance in their lives:  Patient will demonstrate ability to communicate their needs through discussion and/or role plays  Summary of Patient Progress: Patient was present for half of the group process as she left in the middle of group. She participated in the icebreaker but after this did not speak much.   Therapeutic Modalities:   Cognitive Behavioral Therapy Solution-Focused Therapy Assertiveness Training   Shirl Harris, LCSW

## 2021-11-13 NOTE — Progress Notes (Addendum)
Pt denies SI/HI/VH but endorses AH and verbally agrees to approach staff if these become apparent or before harming themselves/others. Rates depression 0/10. Rates anxiety 3/10. Rates pain 6.5/10 in her back. Wound dressing changed. Pt has had complaints of pain throughout the day and gotten PRNs. Pt has been out of her room for most of the day in groups, at meals, and outside. Pt states that hr voices are not as bad but still has them. Scheduled medications administered to pt, per MD orders. RN provided support and encouragement to pt. Q15 min safety checks implemented and continued. Pt safe on the unit. RN will continue to monitor and intervene as needed.  Problem: Education: Goal: Emotional status will improve Outcome: Progressing Goal: Mental status will improve Outcome: Progressing  11/13/21 0816  Psych Admission Type (Psych Patients Only)  Admission Status Involuntary  Psychosocial Assessment  Patient Complaints Anxiety  Eye Contact Fair  Facial Expression Other (Comment) (WDL)  Affect Anxious;Appropriate to circumstance  Speech Logical/coherent  Interaction Assertive  Motor Activity Slow  Appearance/Hygiene Unremarkable;Improved  Behavior Characteristics Cooperative;Appropriate to situation;Anxious  Mood Anxious;Pleasant  Thought Process  Coherency WDL  Content Phobias  Delusions None reported or observed  Perception Hallucinations  Hallucination Auditory  Judgment Impaired  Confusion None  Danger to Self  Current suicidal ideation? Denies  Danger to Others  Danger to Others None reported or observed

## 2021-11-13 NOTE — Progress Notes (Signed)
[facility] MD Progress Note  11/13/2021 1:52 PM [name]  MRN:  [id number] Subjective: Follow-up for this 46 year old woman with a history of schizoaffective disorder.  Patient says she is feeling better.  Mood is much better and more stable.  Auditory hallucinations are minimal.  Tolerating medicine well.  Getting up and going to groups and interacting appropriately.  No sign of acute dangerousness in her behavior Principal Problem: Schizoaffective disorder ([place]) Diagnosis: Principal Problem:   Schizoaffective disorder ([place]) Active Problems:   GERD   Morbid obesity with BMI of 40.0-44.9, adult (HCC)   Abdominal wall seroma - chronic  Total Time spent with patient: 30 minutes  Past Psychiatric History: Past history of schizoaffective disorder  Past Medical History:  Past Medical History:  Diagnosis Date   ADHD    Anal fistula    Anxiety    Arthritis    pt denies 5/3 visit    Bipolar affective ([place])    Blood transfusion without reported diagnosis    Colostomy in place [facility])    CVA (cerebrovascular accident) ([place])    left cerebellar infarct found accidentally on CT (2020)    Depression    Diverticulitis 2008   perforated/ requiring resection   Fatty liver    Gallstones    GERD (gastroesophageal reflux disease)    occ.   Headache    after a fall   Herpes simplex    History of colon polyps    History of hiatal hernia    small   History of kidney stones    cystoscopy  basket removal   Hypertension    Iron deficiency anemia    Obese    OCD (obsessive compulsive disorder)    Panic attacks    PONV (postoperative nausea and vomiting) 10/04/2020   Pulmonary nodules    Sarcoidosis    Schizophrenia ([place])    Shortness of breath dyspnea    pt denies on 5/3 visit    Sleep apnea    mild, does not use c-pap machine   Substance abuse ([place])    12 years ago-crack cocaine    Past Surgical History:  Procedure Laterality Date   Abdominal wall hernia  07/2007   open repair with  lysis of adhesions   APPLICATION OF WOUND VAC N/A 06/29/2019   Procedure: PLACEMENT OF WOUND VAC;  Surgeon: Greer Pickerel, MD;  Location: [facility];  Service: General;  Laterality: N/A;   BRONCHIAL BRUSHINGS  04/11/2019   Procedure: BRONCHIAL BRUSHINGS;  Surgeon: Collene Gobble, MD;  Location: [facility] ENDOSCOPY;  Service: Pulmonary;;   BRONCHIAL WASHINGS  04/11/2019   Procedure: BRONCHIAL WASHINGS;  Surgeon: Collene Gobble, MD;  Location: [facility] ENDOSCOPY;  Service: Pulmonary;;   COLONOSCOPY  03/13/2010   NTI:[id number] hemorrhoids likely cause of hematochezia, otherwise normal   COLONOSCOPY  05/24/2019   COLONOSCOPY WITH ESOPHAGOGASTRODUODENOSCOPY (EGD)  04/2019   Colostomy reversal  11/2006   DEBRIDEMENT OF ABDOMINAL WALL ABSCESS N/A 06/29/2019   Procedure: EXCISIONAL DEBRIDEMENT OF ABDOMINAL WALL/SUBCU SEROMA;  Surgeon: Greer Pickerel, MD;  Location: [facility];  Service: General;  Laterality: N/A;   ESOPHAGOGASTRODUODENOSCOPY  03/13/2010   [id number] esophagus, status post passage of a Maloney dilator/Small hiatal hernia/ Antral erosions, status post biopsy   EXAMINATION UNDER ANESTHESIA N/A 05/11/2012   Procedure: EXAM UNDER ANESTHESIA;  Surgeon: Donato Heinz, MD;  Location: [facility];  Service: General;  Laterality: N/A;   Exploratory laparotomy with resection  2008   colonoscopy   FINE NEEDLE ASPIRATION  04/11/2019   Procedure: FINE NEEDLE ASPIRATION (FNA) LINEAR;  Surgeon: Collene Gobble, MD;  Location: [facility] ENDOSCOPY;  Service: Pulmonary;;   FINGER CLOSED REDUCTION Right 08/05/2012   Procedure: CLOSED REDUCTION RIGHT THUMB (FINGER);  Surgeon: Linna Hoff, MD;  Location: [place];  Service: Orthopedics;  Laterality: Right;   HERNIA REPAIR     incisional hernia surgery    Hx of abd wall seroma  08/2007   Drained via [place] in [place], [place]   Hx of abd wall seroma  10/2007   Drained by Dr. Geroge Baseman in office   [place] N/A 06/29/2019   Procedure: INCISION AND DRAINAGE;  Surgeon:  Greer Pickerel, MD;  Location: [facility];  Service: General;  Laterality: N/A;   IR RADIOLOGIST EVAL & MGMT  03/15/2019   IR RADIOLOGIST EVAL & MGMT  04/06/2019   Kidney stones     LAPAROSCOPIC GASTRIC SLEEVE RESECTION N/A 02/13/2014   Procedure: LAPAROSCOPIC GASTRIC SLEEVE RESECTION LYSIS OF ADHESIONS, UPPER ENDOSCOPY;  Surgeon: [name], MD;  Location: [facility];  Service: General;  Laterality: N/A;   LAPAROSCOPY N/A 09/28/2014   Procedure: LAPAROSCOPY DIAGNOSTIC, INCISION AND DRAINAGE WITH LAPAROSCOPIC EXPLORATION OF ABDOMINAL WALL SEROMA with ultrasound;  Surgeon: Greer Pickerel, MD;  Location: [facility];  Service: General;  Laterality: N/A;   LUNG BIOPSY  04/11/2019   Procedure: LUNG BIOPSY;  Surgeon: Collene Gobble, MD;  Location: [facility] ENDOSCOPY;  Service: Pulmonary;;  distal trachea   PLACEMENT OF SETON N/A 05/11/2012   Procedure: PLACEMENT OF SETON;  Surgeon: Donato Heinz, MD;  Location: [facility];  Service: General;  Laterality: N/A;   root canal 7-16     TONSILLECTOMY     TREATMENT FISTULA ANAL     x 2   UPPER GASTROINTESTINAL ENDOSCOPY  05/24/2019   VIDEO BRONCHOSCOPY WITH ENDOBRONCHIAL ULTRASOUND N/A 04/11/2019   Procedure: VIDEO BRONCHOSCOPY WITH ENDOBRONCHIAL ULTRASOUND;  Surgeon: Collene Gobble, MD;  Location: [facility] ENDOSCOPY;  Service: Pulmonary;  Laterality: N/A;   Family History:  Family History  Problem Relation Age of Onset   Asthma Maternal Grandmother    Colon polyps Maternal Grandmother    Colon cancer Maternal Grandmother    Esophageal cancer Neg Hx    Rectal cancer Neg Hx    Stomach cancer Neg Hx    Pancreatic cancer Neg Hx    Liver disease Neg Hx    Family Psychiatric  History: See previous Social History:  Social History   Substance and Sexual Activity  Alcohol Use No   Alcohol/week: 0.0 standard drinks of alcohol     Social History   Substance and Sexual Activity  Drug Use Never    Social History   Socioeconomic History   Marital status: Single    Spouse  name: Not on file   Number of children: 2   Years of education: 12    Highest education level: Not on file  Occupational History   Occupation: Insurance claims handler: UNEMPLOYED  Tobacco Use   Smoking status: Former    Packs/day: 0.50    Years: 14.00    Total pack years: 7.00    Types: E-cigarettes, Cigarettes    Quit date: 12/20/2013    Years since quitting: 7.9   Smokeless tobacco: Never   Tobacco comments:    Vapor cigarettes.  Vaping Use   Vaping Use: Every day   Last attempt to quit: 02/24/2019   Substances: Nicotine, Flavoring   Devices: started  2015  Substance and Sexual Activity   Alcohol use: No    Alcohol/week: 0.0 standard drinks of alcohol   Drug use: Never   Sexual activity: Not Currently    Birth control/protection: Abstinence  Other Topics Concern   Not on file  Social History Narrative   2 children   Social Determinants of Health   Financial Resource Strain: Low Risk  (12/19/2020)   Overall Financial Resource Strain (CARDIA)    Difficulty of Paying Living Expenses: Not hard at all  Food Insecurity: No Food Insecurity (11/11/2021)   Hunger Vital Sign    Worried About Running Out of Food in the Last Year: Never true    Ran Out of Food in the Last Year: Never true  Transportation Needs: No Transportation Needs (11/11/2021)   PRAPARE - Hydrologist (Medical): No    Lack of Transportation (Non-Medical): No  Physical Activity: Inactive (12/19/2020)   Exercise Vital Sign    Days of Exercise per Week: 0 days    Minutes of Exercise per Session: 0 min  Stress: No Stress Concern Present (12/19/2020)   [place]    Feeling of Stress : Not at all  Social Connections: Moderately Integrated (12/19/2020)   Social Connection and Isolation Panel [NHANES]    Frequency of Communication with Friends and Family: More than three times a week    Frequency of Social  Gatherings with Friends and Family: More than three times a week    Attends Religious Services: 1 to 4 times per year    Active Member of [employer] or Organizations: Yes    Attends Archivist Meetings: 1 to 4 times per year    Marital Status: Never married   Additional Social History:                         Sleep: Fair  Appetite:  Fair  Current Medications: Current Facility-Administered Medications  Medication Dose Route Frequency Provider Last Rate Last Admin   acetaminophen (TYLENOL) tablet 650 mg  650 mg Oral Q6H PRN Alonso Gapinski T, MD   650 mg at 11/13/21 0905   alum & mag hydroxide-simeth (MAALOX/MYLANTA) 200-200-20 MG/5ML suspension 30 mL  30 mL Oral Q4H PRN Laniah Grimm T, MD       benztropine (COGENTIN) tablet 1 mg  1 mg Oral Q8H PRN Necha Harries, [name], MD   1 mg at 11/11/21 0834   docusate sodium (COLACE) capsule 100 mg  100 mg Oral Daily Annete Ayuso, [name], MD   100 mg at 11/13/21 0816   gabapentin (NEURONTIN) capsule 600 mg  600 mg Oral TID Brizeida Mcmurry T, MD   600 mg at 11/13/21 1135   haloperidol (HALDOL) tablet 5 mg  5 mg Oral BID Nimai Burbach, [name], MD   5 mg at 11/13/21 0816   lisinopril (ZESTRIL) tablet 20 mg  20 mg Oral Daily Alyona Romack, [name], MD   20 mg at 11/13/21 7846   lithium carbonate capsule 600 mg  600 mg Oral BID Tiena Manansala T, MD   600 mg at 11/13/21 0815   LORazepam (ATIVAN) tablet 1 mg  1 mg Oral Q1500 Aleece Loyd T, MD   1 mg at 11/12/21 1407   magnesium hydroxide (MILK OF MAGNESIA) suspension 30 mL  30 mL Oral Daily PRN Takeila Thayne, [name], MD       polyethylene glycol (MIRALAX / GLYCOLAX)  packet 17 g  17 g Oral Daily PRN Gianella Chismar T, MD       QUEtiapine (SEROQUEL) tablet 800 mg  800 mg Oral QHS Amari Zagal T, MD   800 mg at 11/12/21 2100   senna-docusate (Senokot-S) tablet 1 tablet  1 tablet Oral QHS PRN Nakeita Styles, [name], MD       valACYclovir (VALTREX) tablet 500 mg  500 mg Oral BID Slayton Lubitz, [name], MD   500 mg at 11/13/21 0815    ziprasidone (GEODON) capsule 60 mg  60 mg Oral BID Diksha Tagliaferro, [name], MD   60 mg at 11/13/21 3557    Lab Results: No results found for this or any previous visit (from the past 76 hour(s)).  Blood Alcohol level:  Lab Results  Component Value Date   ETH <10 04/14/2021   ETH <10 32/20/2542    Metabolic Disorder Labs: Lab Results  Component Value Date   HGBA1C 5.2 08/16/2020   MPG 103 08/16/2020   No results found for: "PROLACTIN" Lab Results  Component Value Date   CHOL 196 03/04/2021   TRIG 115 03/04/2021   HDL 64 03/04/2021   CHOLHDL 3.1 03/04/2021   VLDL 33 08/02/2013   LDLCALC 112 (H) 03/04/2021   LDLCALC 107 (H) 01/09/2019    Physical Findings: AIMS: Facial and Oral Movements Muscles of Facial Expression: None, normal Lips and Perioral Area: None, normal Jaw: None, normal Tongue: None, normal,Extremity Movements Upper (arms, wrists, hands, fingers): None, normal Lower (legs, knees, ankles, toes): None, normal, Trunk Movements Neck, shoulders, hips: None, normal, Overall Severity Severity of abnormal movements (highest score from questions above): None, normal Incapacitation due to abnormal movements: None, normal Patient's awareness of abnormal movements (rate only patient's report): No Awareness, Dental Status Current problems with teeth and/or dentures?: No Does patient usually wear dentures?: No  CIWA:    COWS:     Musculoskeletal: Strength & Muscle Tone: within normal limits Gait & Station: normal Patient leans: N/A  Psychiatric Specialty Exam:  Presentation  General Appearance: Appropriate for Environment  Eye Contact:Good  Speech:Clear and Coherent  Speech Volume:Normal  Handedness:Right   Mood and Affect  Mood:Anxious  Affect:Blunt   Thought Process  Thought Processes:Linear  Descriptions of Associations:Circumstantial  Orientation:Full (Time, Place and Person)  Thought Content:Paranoid Ideation  History of  Schizophrenia/Schizoaffective disorder:Yes  Duration of Psychotic Symptoms:Greater than six months  Hallucinations:No data recorded Ideas of Reference:Paranoia  Suicidal Thoughts:No data recorded Homicidal Thoughts:No data recorded  Sensorium  Memory:Immediate Fair; Recent Fair  Judgment:Impaired  Insight:Poor   Executive Functions  Concentration:Fair  Attention Span:Fair  [place]inger   Psychomotor Activity  Psychomotor Activity:No data recorded  [place]; Resilience; Social Support   Sleep  Sleep:No data recorded   Physical Exam: Physical Exam Vitals and nursing note reviewed.  Constitutional:      Appearance: Normal appearance.  HENT:     Head: Normocephalic and atraumatic.     Mouth/Throat:     Pharynx: Oropharynx is clear.  Eyes:     Pupils: Pupils are equal, round, and reactive to light.  Cardiovascular:     Rate and Rhythm: Normal rate and regular rhythm.  Pulmonary:     Effort: Pulmonary effort is normal.     Breath sounds: Normal breath sounds.  Abdominal:     General: Abdomen is flat.     Palpations: Abdomen is soft.  Musculoskeletal:        General: Normal range  of motion.  Skin:    General: Skin is warm and dry.  Neurological:     General: No focal deficit present.     Mental Status: She is alert. Mental status is at baseline.  Psychiatric:        Attention and Perception: Attention normal. She perceives auditory hallucinations.        Mood and Affect: Mood normal.        Speech: Speech normal.        Behavior: Behavior normal.        Thought Content: Thought content normal.        Cognition and Memory: Cognition normal.        Judgment: Judgment normal.    Review of Systems  Constitutional: Negative.   HENT: Negative.    Eyes: Negative.   Respiratory: Negative.    Cardiovascular: Negative.   Gastrointestinal: Negative.   Musculoskeletal: Negative.   Skin:  Negative.   Neurological: Negative.   Psychiatric/Behavioral: Negative.     Blood pressure (!) 140/74, pulse 98, temperature 97.7 F (36.5 C), temperature source Oral, resp. rate 18, height '5\' 7"'$  (1.702 m), weight 129.7 kg, SpO2 97 %. Body mass index is 44.79 kg/m.   Treatment Plan Summary: Medication management and Plan no change to medication.  Reviewed medicines.  Patient will be planning on discharge tomorrow around noon.  Reviewed medicines.  Patient no longer appears to be acutely dangerous.  Prescriptions will be prepared.  She agrees to plan  Alethia Berthold, MD 11/13/2021, 1:52 PM

## 2021-11-13 NOTE — Progress Notes (Signed)
Recreation Therapy Notes  INPATIENT RECREATION THERAPY ASSESSMENT  Patient Details Name: MIRYAH RALLS MRN: [id number] DOB: 1976-02-09 Today's Date: 11/13/2021       Information Obtained From: Patient  Able to Participate in Assessment/Interview: Yes  Patient Presentation: Responsive  Reason for Admission (Per Patient): Active Symptoms  Patient Stressors: Other (Comment) (Life)  Coping Skills:   Deep Breathing, Other (Comment) (Vaping)  Leisure Interests (2+):  Social - Family, Therapist, music - Other (Comment), Music - Listen, Individual - TV (Sit outside)  Frequency of Recreation/Participation: Monthly  Awareness of Community Resources:  Yes  [employer]:  [place]  Current Use: No  If no, Barriers?: Attitudinal  Expressed Interest in [place]: Yes  [place] of Residence:  [place]  Patient Main Form of Transportation: Musician  Patient Strengths:  Big heart  Patient Identified Areas of Improvement:  Acceptance of others  Patient Goal for Hospitalization:  Feel better with medication  Current SI (including self-harm):  No  Current HI:  No  Current AVH: No  Staff Intervention Plan: Group Attendance, Collaborate with Interdisciplinary Treatment Team  Consent to Intern Participation: N/A  Christpher Stogsdill 11/13/2021, 3:15 PM

## 2021-11-13 NOTE — BHH Group Notes (Signed)
[facility] Group Notes:  (Nursing/MHT/Case Management/Adjunct)  Date:  11/13/2021  Time:  9:47 AM  Type of Therapy:   Community Meeting  Participation Level:  Active  Participation Quality:  Appropriate, Attentive, and Sharing  Affect:  Appropriate  Cognitive:  Alert and Appropriate  Insight:  Appropriate and Good  Engagement in Group:  Engaged  Modes of Intervention:  Education and Support  Summary of Progress/Problems:  [name] [name] 11/13/2021, 9:47 AM

## 2021-11-13 NOTE — Progress Notes (Signed)
Recreation Therapy Notes  Date: 11/13/2021   Time: 10:40 am    Location: Craft room      Behavioral response: Appropriate   Intervention Topic: Happiness      Discussion/Intervention:  Group content today was focused on Happiness. The group defined happiness and described where happiness comes from. Individuals identified what makes them happy and how they go about making others happy. Patients expressed things that stop them from being happy and ways they can improve their happiness. The group stated reasons why it is important to be happy. The group participated in the intervention "My Happiness", where they had a chance to identify and express things that make them happy. Clinical Observations/Feedback: Patient came to group and defined happiness as joyful and content. She expressed that happiness is important for relationships. Individual was social with peers and staff while participating in the intervention.   Kolby Schara LRT/CTRS             Chrisopher Pustejovsky 11/13/2021 12:24 PM

## 2021-11-13 NOTE — Plan of Care (Signed)
  Problem: Education: Goal: Knowledge of General Education information will improve Description: Including pain rating scale, medication(s)/side effects and non-pharmacologic comfort measures Outcome: Progressing   Problem: Health Behavior/Discharge Planning: Goal: Ability to manage health-related needs will improve Outcome: Progressing   Problem: Clinical Measurements: Goal: Ability to maintain clinical measurements within normal limits will improve Outcome: Progressing Goal: Will remain free from infection Outcome: Progressing Goal: Diagnostic test results will improve Outcome: Progressing Goal: Respiratory complications will improve Outcome: Progressing Goal: Cardiovascular complication will be avoided Outcome: Progressing   Problem: Self-Concept: Goal: Will verbalize positive feelings about self Outcome: Progressing   Problem: Self-Care: Goal: Ability to participate in self-care as condition permits will improve Outcome: Progressing   Problem: Safety: Goal: Ability to redirect hostility and anger into socially appropriate behaviors will improve Outcome: Progressing Goal: Ability to remain free from injury will improve Outcome: Progressing   Problem: Role Relationship: Goal: Ability to communicate needs accurately will improve Outcome: Progressing Goal: Ability to interact with others will improve Outcome: Progressing   Problem: Nutritional: Goal: Ability to achieve adequate nutritional intake will improve Outcome: Progressing

## 2021-11-13 NOTE — BHH Suicide Risk Assessment (Signed)
[facility] Discharge Suicide Risk Assessment   Principal Problem: Schizoaffective disorder [facility]) Discharge Diagnoses: Principal Problem:   Schizoaffective disorder ([place]) Active Problems:   GERD   Morbid obesity with BMI of 40.0-44.9, adult (HCC)   Abdominal wall seroma - chronic   Total Time spent with patient: 30 minutes  Musculoskeletal: Strength & Muscle Tone: within normal limits Gait & Station: normal Patient leans: N/A  Psychiatric Specialty Exam  Presentation  General Appearance: Appropriate for Environment  Eye Contact:Good  Speech:Clear and Coherent  Speech Volume:Normal  Handedness:Right   Mood and Affect  Mood:Anxious  Duration of Depression Symptoms: Greater than two weeks  Affect:Blunt   Thought Process  Thought Processes:Linear  Descriptions of Associations:Circumstantial  Orientation:Full (Time, Place and Person)  Thought Content:Paranoid Ideation  History of Schizophrenia/Schizoaffective disorder:Yes  Duration of Psychotic Symptoms:Greater than six months  Hallucinations:No data recorded Ideas of Reference:Paranoia  Suicidal Thoughts:No data recorded Homicidal Thoughts:No data recorded  Sensorium  Memory:Immediate Fair; Recent Fair  Judgment:Impaired  Insight:Poor   Executive Functions  Concentration:Fair  Attention Span:Fair  [place]   Psychomotor Activity  Psychomotor Activity:No data recorded  [place]; Resilience; Social Support   Sleep  Sleep:No data recorded  Physical Exam: Physical Exam Vitals and nursing note reviewed.  Constitutional:      Appearance: Normal appearance.  HENT:     Head: Normocephalic and atraumatic.     Mouth/Throat:     Pharynx: Oropharynx is clear.  Eyes:     Pupils: Pupils are equal, round, and reactive to light.  Cardiovascular:     Rate and Rhythm: Normal rate and regular rhythm.  Pulmonary:     Effort:  Pulmonary effort is normal.     Breath sounds: Normal breath sounds.  Abdominal:     General: Abdomen is flat.     Palpations: Abdomen is soft.  Musculoskeletal:        General: Normal range of motion.  Skin:    General: Skin is warm and dry.  Neurological:     General: No focal deficit present.     Mental Status: She is alert. Mental status is at baseline.  Psychiatric:        Attention and Perception: Attention normal.        Mood and Affect: Mood normal.        Speech: Speech normal.        Behavior: Behavior is cooperative.        Thought Content: Thought content normal.        Cognition and Memory: Cognition normal.        Judgment: Judgment normal.    Review of Systems  Constitutional: Negative.   HENT: Negative.    Eyes: Negative.   Respiratory: Negative.    Cardiovascular: Negative.   Gastrointestinal: Negative.   Musculoskeletal: Negative.   Skin: Negative.   Neurological: Negative.   Psychiatric/Behavioral: Negative.     Blood pressure (!) 140/74, pulse 98, temperature 97.7 F (36.5 C), temperature source Oral, resp. rate 18, height '5\' 7"'$  (1.702 m), weight 129.7 kg, SpO2 97 %. Body mass index is 44.79 kg/m.  Mental Status Per Nursing Assessment::   On Admission:  Thoughts of violence towards others  Demographic Factors:  Caucasian  Loss Factors: NA  Historical Factors: NA  Risk Reduction Factors:   Positive social support and Positive therapeutic relationship  Continued Clinical Symptoms:  Depression:   Anhedonia  Cognitive Features That Contribute To Risk:  None  Suicide Risk:  Minimal: No identifiable suicidal ideation.  Patients presenting with no risk factors but with morbid ruminations; may be classified as minimal risk based on the severity of the depressive symptoms    Plan Of Care/Follow-up recommendations:  Other:  Patient appears to be doing much better.  Mood is improved.  Psychotic symptoms are minimal and not disruptive.  She  shows good insight and is tolerating and agreeable to medicine.  No evidence of acute dangerousness.  Agrees to outpatient follow-up plan.  Alethia Berthold, MD 11/13/2021, 1:53 PM

## 2021-11-14 ENCOUNTER — Other Ambulatory Visit: Payer: Self-pay

## 2021-11-14 MED ORDER — ONDANSETRON 4 MG PO TBDP
4.0000 mg | ORAL_TABLET | Freq: Once | ORAL | Status: AC
  Administered 2021-11-14: 4 mg via ORAL
  Filled 2021-11-14: qty 1

## 2021-11-14 NOTE — Progress Notes (Signed)
  [facility] Adult Case Management Discharge Plan :  Will you be returning to the same living situation after discharge:  Yes,  pt plans to return home upon discharge. At discharge, do you have transportation home?: Yes,  mother confirmed that someone will pick pt up today after 12 noon. Do you have the ability to pay for your medications: Yes,  [employer].  Release of information consent forms completed and in the chart;  Patient's signature needed at discharge.  Patient to Follow up at:  Follow-up Information     [facility]. Call.   Why: You have an appointment scheduled for Tuesday, 12/23/21 at 8:30AM. This is a virtual appointment. Thanks! Contact information: [place], [place] [address] Phone: [phone]                Next level of care provider has access to [place] and Suicide Prevention discussed: Yes,  SPE completed with mother, [name].     Has patient been referred to the Quitline?: N/A patient is not a smoker  Patient has been referred for addiction treatment: N/A  Shirl Harris, LCSW 11/14/2021, 9:33 AM

## 2021-11-14 NOTE — Discharge Summary (Signed)
Physician Discharge Summary Note  Patient:  [name] is an 46 y.o., female MRN:  [id number] DOB:  04/29/1975 Patient phone:  [phone] (home)  Patient address:   [address] [place] [address],  Total Time spent with patient: 30 minutes  Date of Admission:  11/11/2021 Date of Discharge: 11/14/2021  Reason for Admission: Admit ended for mood disorder psychotic disorder hallucinations poor home functioning return of symptoms  Principal Problem: Schizoaffective disorder [facility]) Discharge Diagnoses: Principal Problem:   Schizoaffective disorder ([place]) Active Problems:   GERD   Morbid obesity with BMI of 40.0-44.9, adult ([place])   Abdominal wall seroma - chronic   Past Psychiatric History: Past history of schizoaffective or bipolar disorder  Past Medical History:  Past Medical History:  Diagnosis Date   ADHD    Anal fistula    Anxiety    Arthritis    pt denies 5/3 visit    Bipolar affective ([place])    Blood transfusion without reported diagnosis    Colostomy in place [facility])    CVA (cerebrovascular accident) ([place])    left cerebellar infarct found accidentally on CT (2020)    Depression    Diverticulitis 2008   perforated/ requiring resection   Fatty liver    Gallstones    GERD (gastroesophageal reflux disease)    occ.   Headache    after a fall   Herpes simplex    History of colon polyps    History of hiatal hernia    small   History of kidney stones    cystoscopy  basket removal   Hypertension    Iron deficiency anemia    Obese    OCD (obsessive compulsive disorder)    Panic attacks    PONV (postoperative nausea and vomiting) 10/04/2020   Pulmonary nodules    Sarcoidosis    Schizophrenia ([place])    Shortness of breath dyspnea    pt denies on 5/3 visit    Sleep apnea    mild, does not use c-pap machine   Substance abuse ([place])    12 years ago-crack cocaine    Past Surgical History:  Procedure Laterality Date   Abdominal wall hernia  07/2007    open repair with lysis of adhesions   APPLICATION OF WOUND VAC N/A 06/29/2019   Procedure: PLACEMENT OF WOUND VAC;  Surgeon: Greer Pickerel, MD;  Location: [facility];  Service: General;  Laterality: N/A;   BRONCHIAL BRUSHINGS  04/11/2019   Procedure: BRONCHIAL BRUSHINGS;  Surgeon: Collene Gobble, MD;  Location: [facility] ENDOSCOPY;  Service: Pulmonary;;   BRONCHIAL WASHINGS  04/11/2019   Procedure: BRONCHIAL WASHINGS;  Surgeon: Collene Gobble, MD;  Location: [facility] ENDOSCOPY;  Service: Pulmonary;;   COLONOSCOPY  03/13/2010   ZGY:[id number] hemorrhoids likely cause of hematochezia, otherwise normal   COLONOSCOPY  05/24/2019   COLONOSCOPY WITH ESOPHAGOGASTRODUODENOSCOPY (EGD)  04/2019   Colostomy reversal  11/2006   DEBRIDEMENT OF ABDOMINAL WALL ABSCESS N/A 06/29/2019   Procedure: EXCISIONAL DEBRIDEMENT OF ABDOMINAL WALL/SUBCU SEROMA;  Surgeon: Greer Pickerel, MD;  Location: [facility];  Service: General;  Laterality: N/A;   ESOPHAGOGASTRODUODENOSCOPY  03/13/2010   PRF:[id number] esophagus, status post passage of a Maloney dilator/Small hiatal hernia/ Antral erosions, status post biopsy   EXAMINATION UNDER ANESTHESIA N/A 05/11/2012   Procedure: EXAM UNDER ANESTHESIA;  Surgeon: Donato Heinz, MD;  Location: [facility];  Service: General;  Laterality: N/A;   Exploratory laparotomy with resection  2008   colonoscopy   FINE NEEDLE ASPIRATION  04/11/2019   Procedure: FINE NEEDLE ASPIRATION (FNA) LINEAR;  Surgeon: Collene Gobble, MD;  Location: [facility]COPY;  Service: Pulmonary;;   FINGER CLOSED REDUCTION Right 08/05/2012   Procedure: CLOSED REDUCTION RIGHT THUMB (FINGER);  Surgeon: Linna Hoff, MD;  Location: [place];  Service: Orthopedics;  Laterality: Right;   HERNIA REPAIR     incisional hernia surgery    Hx of abd wall seroma  08/2007   Drained via [place] in [place], [place]   Hx of abd wall seroma  10/2007   Drained by Dr. Geroge Baseman in office   [place] N/A 06/29/2019   Procedure: INCISION AND DRAINAGE;   Surgeon: Greer Pickerel, MD;  Location: [facility];  Service: General;  Laterality: N/A;   IR RADIOLOGIST EVAL & MGMT  03/15/2019   IR RADIOLOGIST EVAL & MGMT  04/06/2019   Kidney stones     LAPAROSCOPIC GASTRIC SLEEVE RESECTION N/A 02/13/2014   Procedure: LAPAROSCOPIC GASTRIC SLEEVE RESECTION LYSIS OF ADHESIONS, UPPER ENDOSCOPY;  Surgeon: [name], MD;  Location: [facility];  Service: General;  Laterality: N/A;   LAPAROSCOPY N/A 09/28/2014   Procedure: LAPAROSCOPY DIAGNOSTIC, INCISION AND DRAINAGE WITH LAPAROSCOPIC EXPLORATION OF ABDOMINAL WALL SEROMA with ultrasound;  Surgeon: Greer Pickerel, MD;  Location: [facility];  Service: General;  Laterality: N/A;   LUNG BIOPSY  04/11/2019   Procedure: LUNG BIOPSY;  Surgeon: Collene Gobble, MD;  Location: [facility] ENDOSCOPY;  Service: Pulmonary;;  distal trachea   PLACEMENT OF SETON N/A 05/11/2012   Procedure: PLACEMENT OF SETON;  Surgeon: Donato Heinz, MD;  Location: [facility];  Service: General;  Laterality: N/A;   root canal 7-16     TONSILLECTOMY     TREATMENT FISTULA ANAL     x 2   UPPER GASTROINTESTINAL ENDOSCOPY  05/24/2019   VIDEO BRONCHOSCOPY WITH ENDOBRONCHIAL ULTRASOUND N/A 04/11/2019   Procedure: VIDEO BRONCHOSCOPY WITH ENDOBRONCHIAL ULTRASOUND;  Surgeon: Collene Gobble, MD;  Location: [facility] ENDOSCOPY;  Service: Pulmonary;  Laterality: N/A;   Family History:  Family History  Problem Relation Age of Onset   Asthma Maternal Grandmother    Colon polyps Maternal Grandmother    Colon cancer Maternal Grandmother    Esophageal cancer Neg Hx    Rectal cancer Neg Hx    Stomach cancer Neg Hx    Pancreatic cancer Neg Hx    Liver disease Neg Hx    Family Psychiatric  History: See previous Social History:  Social History   Substance and Sexual Activity  Alcohol Use No   Alcohol/week: 0.0 standard drinks of alcohol     Social History   Substance and Sexual Activity  Drug Use Never    Social History   Socioeconomic History   Marital status: Single     Spouse name: Not on file   Number of children: 2   Years of education: 12    Highest education level: Not on file  Occupational History   Occupation: Insurance claims handler: UNEMPLOYED  Tobacco Use   Smoking status: Former    Packs/day: 0.50    Years: 14.00    Total pack years: 7.00    Types: E-cigarettes, Cigarettes    Quit date: 12/20/2013    Years since quitting: 7.9   Smokeless tobacco: Never   Tobacco comments:    Vapor cigarettes.  Vaping Use   Vaping Use: Every day   Last attempt to quit: 02/24/2019   Substances: Nicotine, Flavoring   Devices: started  2015  Substance and Sexual Activity   Alcohol use: No    Alcohol/week: 0.0 standard drinks of alcohol   Drug use: Never   Sexual activity: Not Currently    Birth control/protection: Abstinence  Other Topics Concern   Not on file  Social History Narrative   2 children   Social Determinants of Health   Financial Resource Strain: Low Risk  (12/19/2020)   Overall Financial Resource Strain (CARDIA)    Difficulty of Paying Living Expenses: Not hard at all  Food Insecurity: No Food Insecurity (11/11/2021)   Hunger Vital Sign    Worried About Running Out of Food in the Last Year: Never true    Ran Out of Food in the Last Year: Never true  Transportation Needs: No Transportation Needs (11/11/2021)   PRAPARE - Hydrologist (Medical): No    Lack of Transportation (Non-Medical): No  Physical Activity: Inactive (12/19/2020)   Exercise Vital Sign    Days of Exercise per Week: 0 days    Minutes of Exercise per Session: 0 min  Stress: No Stress Concern Present (12/19/2020)   [place]    Feeling of Stress : Not at all  Social Connections: Moderately Integrated (12/19/2020)   Social Connection and Isolation Panel [NHANES]    Frequency of Communication with Friends and Family: More than three times a week    Frequency of Social  Gatherings with Friends and Family: More than three times a week    Attends Religious Services: 1 to 4 times per year    Active Member of [employer] or Organizations: Yes    Attends Archivist Meetings: 1 to 4 times per year    Marital Status: Never married    Hospital Course: Patient was maintained on 15-minute checks.  She did not display any dangerous aggressive violent or suicidal behavior.  She was treated with medication management and group and individual therapy as per usual routine.  Patient did not show any psychotic symptoms but complained initially of some hallucinations which have now improved.  At discharge she completely denies suicidal ideation and there is no indication of acute dangerousness.  She will be given prescriptions for her medicine and agrees to outpatient follow-up  Physical Findings: AIMS: Facial and Oral Movements Muscles of Facial Expression: None, normal Lips and Perioral Area: None, normal Jaw: None, normal Tongue: None, normal,Extremity Movements Upper (arms, wrists, hands, fingers): None, normal Lower (legs, knees, ankles, toes): None, normal, Trunk Movements Neck, shoulders, hips: None, normal, Overall Severity Severity of abnormal movements (highest score from questions above): None, normal Incapacitation due to abnormal movements: None, normal Patient's awareness of abnormal movements (rate only patient's report): No Awareness, Dental Status Current problems with teeth and/or dentures?: No Does patient usually wear dentures?: No  CIWA:    COWS:     Musculoskeletal: Strength & Muscle Tone: within normal limits Gait & Station: normal Patient leans: N/A   Psychiatric Specialty Exam:  Presentation  General Appearance: Appropriate for Environment  Eye Contact:Good  Speech:Clear and Coherent  Speech Volume:Normal  Handedness:Right   Mood and Affect  Mood:Anxious  Affect:Blunt   Thought Process  Thought  Processes:Linear  Descriptions of Associations:Circumstantial  Orientation:Full (Time, Place and Person)  Thought Content:Paranoid Ideation  History of Schizophrenia/Schizoaffective disorder:Yes  Duration of Psychotic Symptoms:Greater than six months  Hallucinations:No data recorded Ideas of Reference:Paranoia  Suicidal Thoughts:No data recorded Homicidal Thoughts:No data recorded  Sensorium  Memory:Immediate  Fair; Recent Fair  Judgment:Impaired  Insight:Poor   Executive Functions  Concentration:Fair  Attention Span:Fair  [place]   Psychomotor Activity  Psychomotor Activity:No data recorded  [place]; Resilience; Social Support   Sleep  Sleep:No data recorded   Physical Exam: Physical Exam Vitals and nursing note reviewed.  Constitutional:      Appearance: Normal appearance.  HENT:     Head: Normocephalic and atraumatic.     Mouth/Throat:     Pharynx: Oropharynx is clear.  Eyes:     Pupils: Pupils are equal, round, and reactive to light.  Cardiovascular:     Rate and Rhythm: Normal rate and regular rhythm.  Pulmonary:     Effort: Pulmonary effort is normal.     Breath sounds: Normal breath sounds.  Abdominal:     General: Abdomen is flat.     Palpations: Abdomen is soft.  Musculoskeletal:        General: Normal range of motion.  Skin:    General: Skin is warm and dry.  Neurological:     General: No focal deficit present.     Mental Status: She is alert. Mental status is at baseline.  Psychiatric:        Attention and Perception: Attention normal.        Mood and Affect: Mood normal.        Speech: Speech normal.        Behavior: Behavior normal.        Thought Content: Thought content normal.        Cognition and Memory: Cognition normal.        Judgment: Judgment normal.    Review of Systems  Constitutional: Negative.   HENT: Negative.    Eyes: Negative.    Respiratory: Negative.    Cardiovascular: Negative.   Gastrointestinal: Negative.   Musculoskeletal: Negative.   Skin: Negative.   Neurological: Negative.   Psychiatric/Behavioral: Negative.     Blood pressure 130/81, pulse 87, temperature 98.1 F (36.7 C), temperature source Oral, resp. rate 18, height '5\' 7"'$  (1.702 m), weight 129.7 kg, SpO2 96 %. Body mass index is 44.79 kg/m.   Social History   Tobacco Use  Smoking Status Former   Packs/day: 0.50   Years: 14.00   Total pack years: 7.00   Types: E-cigarettes, Cigarettes   Quit date: 12/20/2013   Years since quitting: 7.9  Smokeless Tobacco Never  Tobacco Comments   Vapor cigarettes.   Tobacco Cessation:  A prescription for an FDA-approved tobacco cessation medication was offered at discharge and the patient refused   Blood Alcohol level:  Lab Results  Component Value Date   [facility] <10 04/14/2021   ETH <10 95/62/1308    Metabolic Disorder Labs:  Lab Results  Component Value Date   HGBA1C 5.2 08/16/2020   MPG 103 08/16/2020   No results found for: "PROLACTIN" Lab Results  Component Value Date   CHOL 196 03/04/2021   TRIG 115 03/04/2021   HDL 64 03/04/2021   CHOLHDL 3.1 03/04/2021   VLDL 33 08/02/2013   LDLCALC 112 (H) 03/04/2021   LDLCALC 107 (H) 01/09/2019    See Psychiatric Specialty Exam and Suicide Risk Assessment completed by Attending Physician prior to discharge.  Discharge destination:  Home  Is patient on multiple antipsychotic therapies at discharge:  Yes,   Do you recommend tapering to monotherapy for antipsychotics?  No   Has Patient had three or more failed trials of  antipsychotic monotherapy by history:  Yes,   Antipsychotic medications that previously failed include:   1.  Geodon., 2.  Seroquel., and 3.  Haldol will.  Recommended Plan for Multiple Antipsychotic Therapies: NA  Discharge Instructions     Diet - low sodium heart healthy   Complete by: As directed    Discharge wound care:    Complete by: As directed    Continue chronic wound care at home as you have previously   Increase activity slowly   Complete by: As directed       Allergies as of 11/14/2021   No Known Allergies      Medication List     STOP taking these medications    doxycycline 100 MG capsule Commonly known as: VIBRAMYCIN   gabapentin 600 MG tablet Commonly known as: NEURONTIN Replaced by: gabapentin 300 MG capsule   HYDROcodone-acetaminophen 10-325 MG tablet Commonly known as: NORCO   levETIRAcetam 250 MG tablet Commonly known as: KEPPRA   Mirena (52 MG) 20 MCG/DAY Iud Generic drug: levonorgestrel   Promethazine-DM 6.25-15 MG/5ML Soln   Suvorexant 20 MG Tabs       TAKE these medications      Indication  albuterol 108 (90 Base) MCG/ACT inhaler Commonly known as: VENTOLIN HFA Inhale 2 puffs into the lungs every 6 (six) hours as needed for wheezing or shortness of breath.  Indication: Asthma   docusate sodium 100 MG capsule Commonly known as: COLACE Take 1 capsule (100 mg total) by mouth daily.  Indication: Constipation   gabapentin 300 MG capsule Commonly known as: NEURONTIN Take 2 capsules (600 mg total) by mouth 3 (three) times daily. Replaces: gabapentin 600 MG tablet  Indication: Generalized Anxiety Disorder   haloperidol 5 MG tablet Commonly known as: HALDOL Take 1 tablet (5 mg total) by mouth 2 (two) times daily.  Indication: Psychosis   lisinopril 20 MG tablet Commonly known as: ZESTRIL Take 1 tablet (20 mg total) by mouth daily.  Indication: High Blood Pressure Disorder   lithium 600 MG capsule Take 1 capsule (600 mg total) by mouth 2 (two) times daily. What changed:  medication strength how much to take when to take this  Indication: Schizoaffective Disorder   LORazepam 1 MG tablet Commonly known as: ATIVAN Take 1 tablet (1 mg total) by mouth daily in the afternoon.  Indication: Schizophrenia   polyethylene glycol 17 g packet Commonly  known as: MIRALAX / GLYCOLAX Take 17 g by mouth daily as needed (constipation).  Indication: Constipation   QUEtiapine 400 MG tablet Commonly known as: SEROQUEL Take 2 tablets (800 mg total) by mouth at bedtime. What changed: medication strength  Indication: Major Depressive Disorder   valACYclovir 500 MG tablet Commonly known as: VALTREX Take 1 tablet (500 mg total) by mouth daily as needed (fever blister).  Indication: Herpes Simplex Infection   ziprasidone 60 MG capsule Commonly known as: GEODON Take 1 capsule (60 mg total) by mouth 2 (two) times daily.  Indication: Major Depressive Disorder, Schizophrenia        Follow-up Information     [facility]. Call.   Why: You have an appointment scheduled for Tuesday, 12/23/21 at 8:30AM. This is a virtual appointment. Thanks! Contact information: [place], [place] [address] Phone: [phone]                Follow-up recommendations:  Other:  Continue follow-up with regular provider in [facility].  Continue current medicine.  Prescriptions provided  Comments: See  above  Signed: Alethia Berthold, MD 11/14/2021, 11:04 AM

## 2021-11-14 NOTE — Progress Notes (Addendum)
Discharge note: Wound dressing changed. RN met with pt and reviewed pt's discharge instructions. Pt verbalized understanding of discharge instructions and pt did not have any questions. RN reviewed and provided pt with a copy of SRA, AVS and Transition Record. RN returned pt's belongings to pt.  Prescriptions were given to pt. Pt denied SI/HI/AVH and voiced no concerns. Pt was appreciative of the care pt received at [facility]. Patient discharged to the lobby without incident.  Problem: Education: Goal: Emotional status will improve Outcome: Progressing Goal: Mental status will improve Outcome: Progressing   11/14/21 0757  Psych Admission Type (Psych Patients Only)  Admission Status Involuntary  Psychosocial Assessment  Patient Complaints Anxiety;Depression  Eye Contact Fair  Facial Expression Other (Comment) (WDL)  Affect Appropriate to circumstance  Speech Logical/coherent  Interaction Assertive  Motor Activity Slow  Appearance/Hygiene Unremarkable  Behavior Characteristics Cooperative;Appropriate to situation;Calm  Mood Pleasant  Thought Process  Coherency WDL  Content WDL  Delusions None reported or observed  Perception Hallucinations  Hallucination Auditory  Judgment Impaired  Confusion None  Danger to Self  Current suicidal ideation? Denies  Danger to Others  Danger to Others None reported or observed

## 2021-11-14 NOTE — BHH Counselor (Signed)
CSW met with pt briefly to discuss discharge plans. Pt endorsed plans to return home upon discharge. She asked that CSW call her mother, [name] ([phone]) to confirm that someone will be picking her up after noon today. CSW agreed. Pt and CSW discussed potential conversation with mother. She stated that her daughter has been causing her issues. Pt stated that she cannot let her daughter's actions control her mental health. She also shared that her mother is planning to hire someone to stay the evenings with her. CSW inquired if she would like CSW to contact Dr. Erling Cruz of [place] regarding follow up appointment. She agreed. Pt denies any use of tobacco or illicit substances. No other concerns expressed. Contact ended without incident.   CSW contacted [name] to follow up regarding transportation. Price confirmed that someone would be here to pick pt up after noon today. She inquired if CSW felt that pt was ready to be at home by herself. Mother expressed that she felt pt was anxious. CSW explained that this is not unusual as pt realizes that the things they were dealing with in the community have not changed and with that there is typically some anxiety attached to it. CSW also shared that he had been informed by pt that mother planned to hire someone to be at home with pt during the evenings and that pt seemed to be in agreement with this. She voiced understanding. No other concerns expressed. Contact ended without incident.   CSW contacted [place] [phone]). CSW spoke with Caryl Pina who informed CSW that pt has an appointment scheduled for 12/23/21 at 08:30. Caryl Pina stated that this is a virtual appointment. When asked if appointment could be moved closer, she stated that Dr. Erling Cruz is out of the office on vacation until 11/25/21 so it would not be able to be rescheduled until after he returns. CSW voiced understanding. No other concerns expressed. Contact ended  without incident.   CSW updated pt regarding contacts. She was informed of appointment date and time. Pt was also reminded that she can call the office if she feels that she needs to see Dr. Erling Cruz before 10/31 and reminded her that it would be best to do this after 10/3. She voiced understanding. Pt was also informed that mother informed CSW that pt would be picked up today after 12noon. CSW also summarized conversation with mother for pt. No other concerns expressed. Contact ended without incident.   Chalmers Guest. Guerry Bruin, MSW, LCSW, [place] 11/14/2021 10:37 AM

## 2021-11-14 NOTE — Progress Notes (Signed)
Recreation Therapy Notes  INPATIENT RECREATION TR PLAN  Patient Details Name: GLENETTA KIGER MRN: [id number] DOB: 1975-06-06 Today's Date: 11/14/2021  Rec Therapy Plan Is patient appropriate for Therapeutic Recreation?: Yes Treatment times per week: at least 3 Estimated Length of Stay: 5-7 days TR Treatment/Interventions: Group participation (Comment)  Discharge Criteria Pt will be discharged from therapy if:: Discharged Treatment plan/goals/alternatives discussed and agreed upon by:: Patient/family  Discharge Summary Short term goals set: Patient will identify 3 positive coping skills strategies to use post d/c within 5 recreation therapy group sessions Short term goals met: Adequate for discharge Progress toward goals comments: Groups attended Which groups?: Leisure education, Other (Comment) (Happiness) Reason goals not met: N/A Therapeutic equipment acquired: N/A Reason patient discharged from therapy: Discharge from hospital Pt/family agrees with progress & goals achieved: Yes Date patient discharged from therapy: 11/14/21   Darleene Cumpian 11/14/2021, 12:08 PM

## 2021-11-14 NOTE — Progress Notes (Signed)
Recreation Therapy Notes    Date: 11/14/2021   Time: 10:40 am     Location: Courtyard      Behavioral response: Appropriate   Intervention Topic: Leisure     Discussion/Intervention:  Group content today was focused on leisure. The group defined what leisure is and some positive leisure activities they participate in. Individuals identified the difference between good and bad leisure. Participants expressed how they feel after participating in the leisure of their choice. The group discussed how they go about picking a leisure activity and if others are involved in their leisure activities. The patient stated how many leisure activities they have to choose from and reasons why it is important to have leisure time. Individuals participated in the intervention "Exploration of Leisure" where they had a chance to identify new leisure activities as well as benefits of leisure. Clinical Observations/Feedback: Patient came to group late and was able to explore and participate in many leisure activities. Participant was able to identify leisure activities they enjoy outside of the hospital. Individual was social with peers and staff while participating in the intervention.    Jereld Presti LRT/CTRS               Kailynne Ferrington 11/14/2021 11:48 AM

## 2021-11-14 NOTE — Plan of Care (Signed)
  Problem: Coping Skills Goal: STG - Patient will identify 3 positive coping skills strategies to use post d/c within 5 recreation therapy group sessions Description: STG - Patient will identify 3 positive coping skills strategies to use post d/c within 5 recreation therapy group sessions 11/14/2021 1207 by Ernest Haber, LRT Outcome: Adequate for Discharge 11/14/2021 1207 by Ernest Haber, LRT Outcome: Adequate for Discharge

## 2021-11-14 NOTE — Care Management Important Message (Signed)
Important Message  Patient Details  Name: [name] MRN: [id number] Date of Birth: [birth year]   Medicare Important Message Given:  N/A - LOS <3 / Initial given by admissions  Pt signed IM paperwork yesterday and was reminded today that if she did not agree with discharge she can appeal. She denied any interest in appeal process.  Shirl Harris, LCSW 11/14/2021, 9:31 AM

## 2021-11-21 IMAGING — CT CT PELVIS W/ CM
2 of 3 series · 16 of 46 positions shown, 18 images · IV contrast (APPLIED)
Comparison: 06/23/2019

CLINICAL DATA: Anorectal abscess, pelvic pain, rectal bleeding

EXAM:
CT PELVIS WITH CONTRAST
TECHNIQUE: Multidetector CT imaging of the pelvis was performed using the
standard protocol following the bolus administration of intravenous
contrast.
CONTRAST:  100mL OMNIPAQUE IOHEXOL 300 MG/ML SOLN, additional oral
enteric contrast

[Series 2: axial bone · axial · 0.98mm/px · z∈[+888,+1203]mm · 13 of 73 slices shown, 15 images]
[im 5/73  soft-tissue]
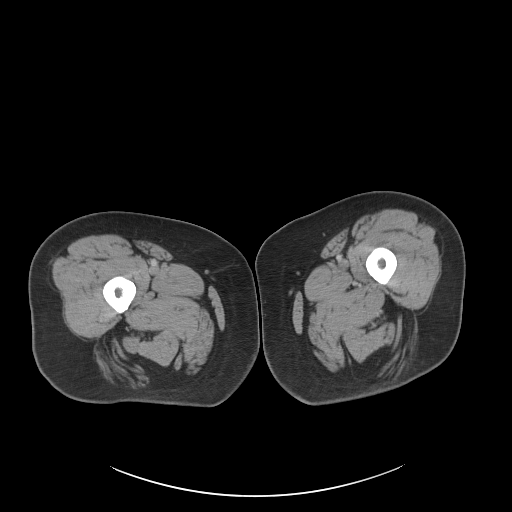
[im 5/73  bone]
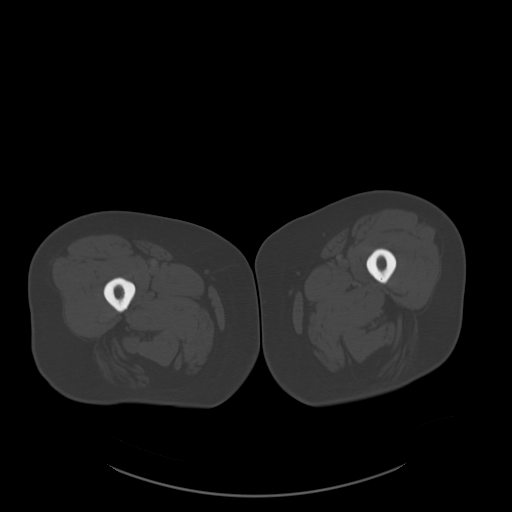
[im 10/73  soft-tissue]
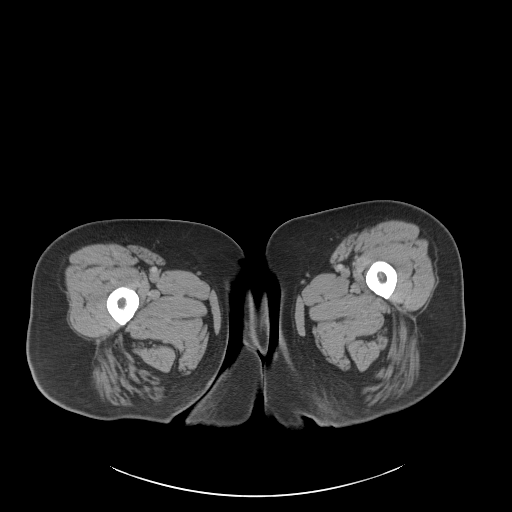
[im 14/73  soft-tissue]
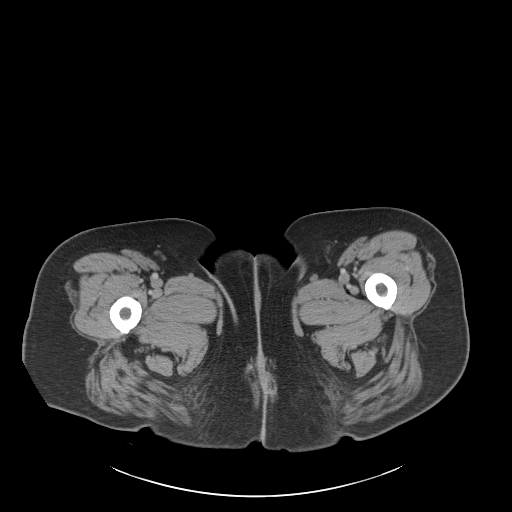
[im 21/73  soft-tissue]
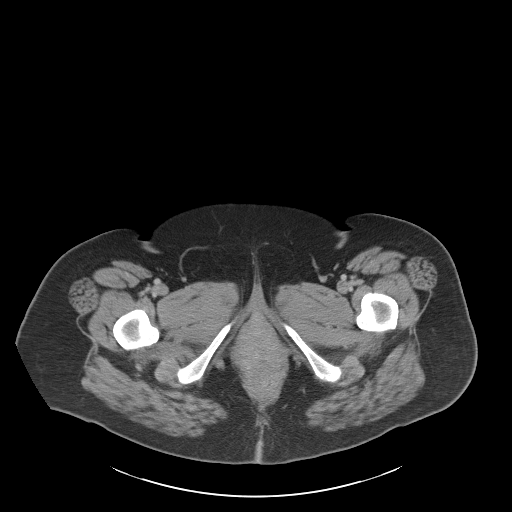
[im 26/73  soft-tissue]
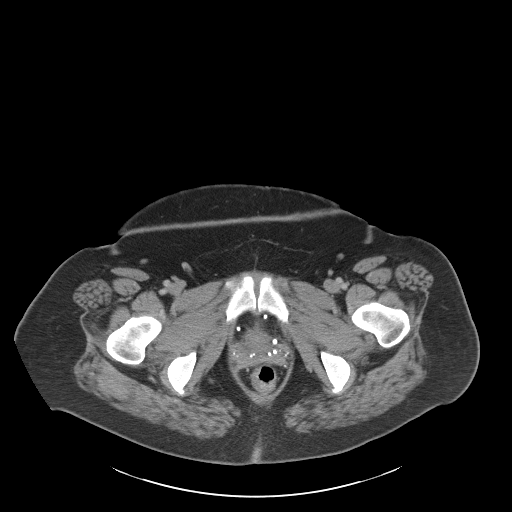
[im 31/73  soft-tissue]
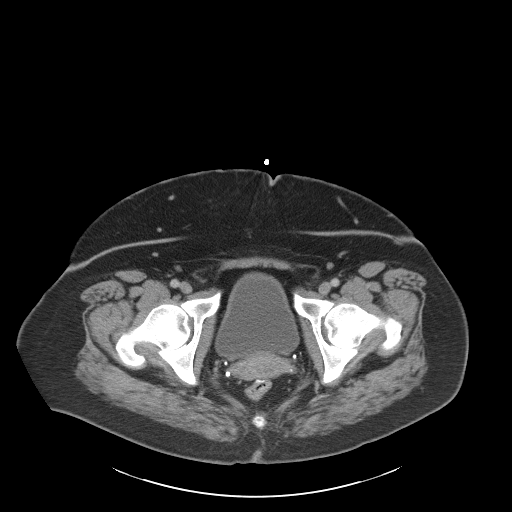
[im 38/73  soft-tissue]
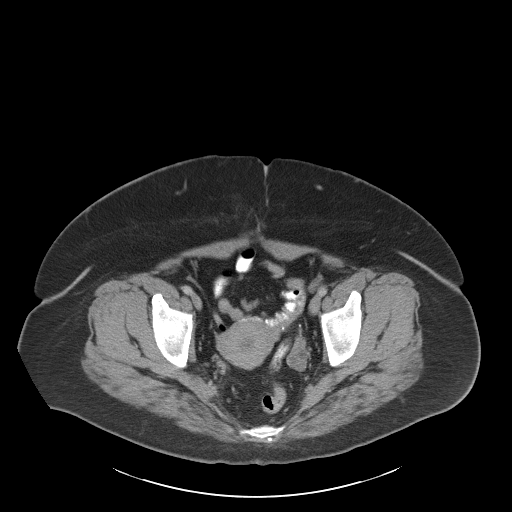
[im 42/73  soft-tissue]
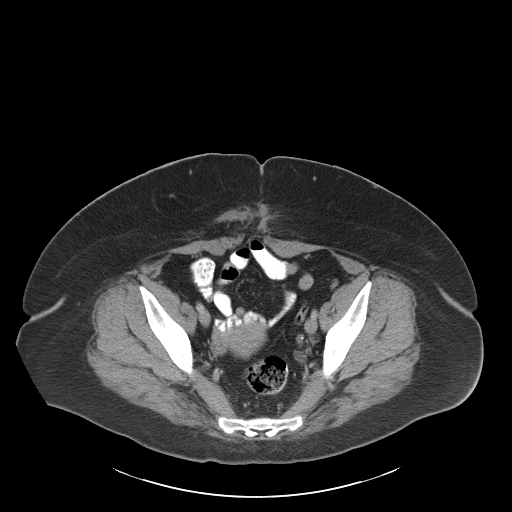
[im 47/73  soft-tissue]
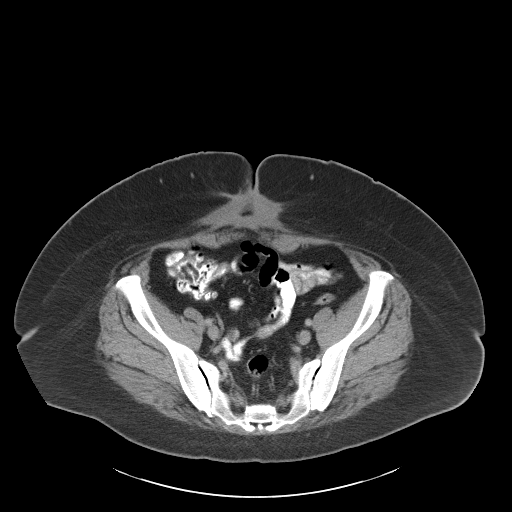
[im 47/73  bone]
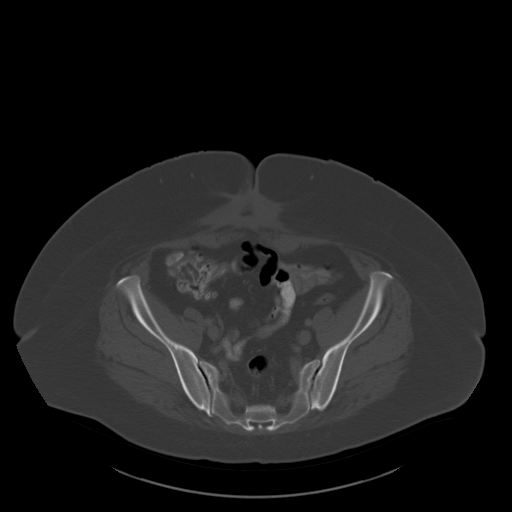
[im 52/73  soft-tissue]
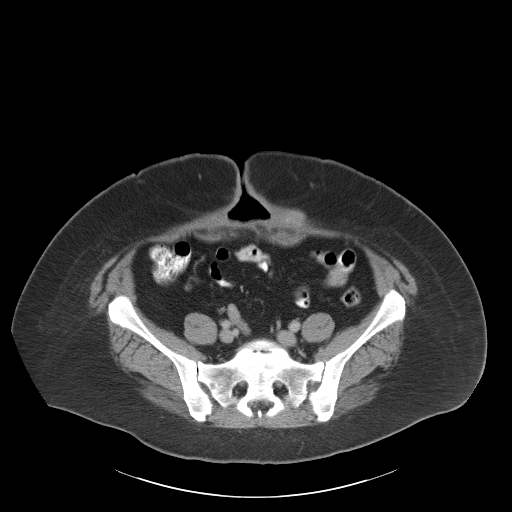
[im 59/73  soft-tissue]
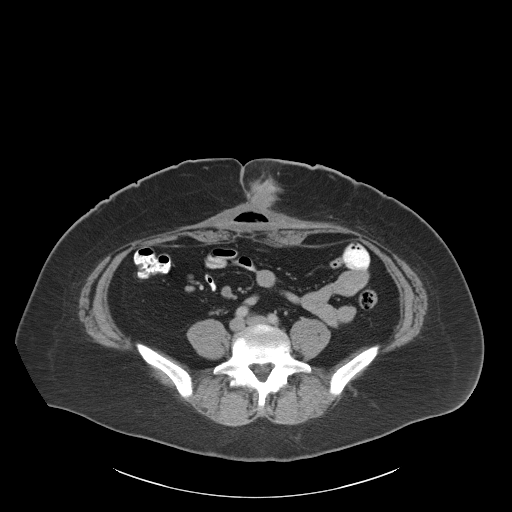
[im 63/73  soft-tissue]
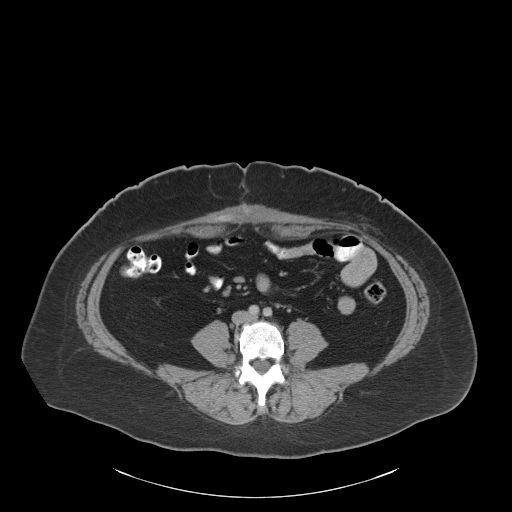
[im 68/73  soft-tissue]
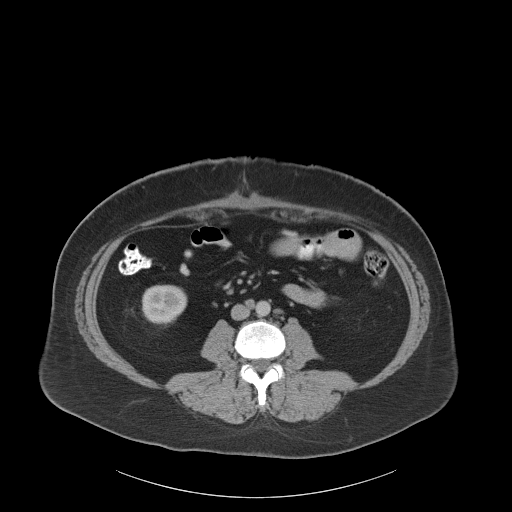

[Series 3: coronal st · coronal · 0.71mm/px · 3 of 175 slices shown]
[im 59/175  soft-tissue]
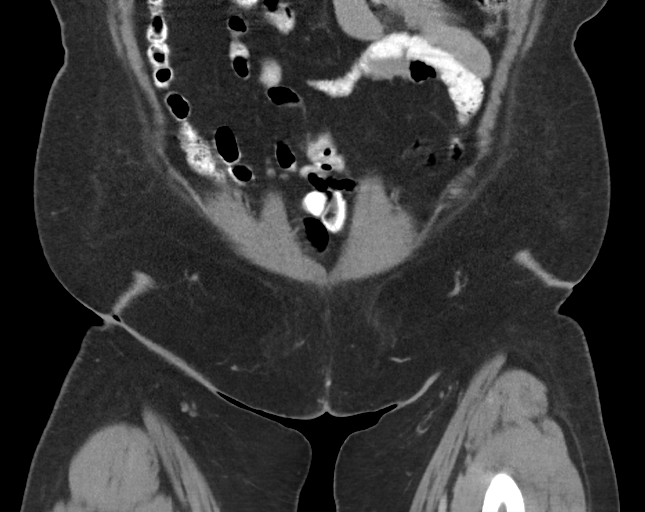
[im 78/175  soft-tissue]
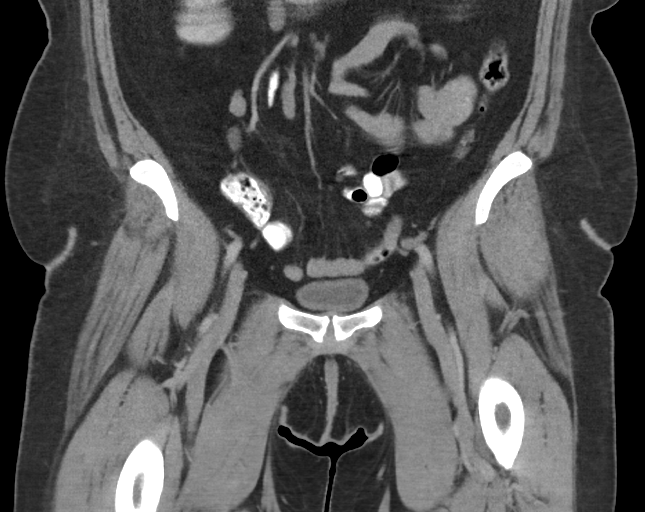
[im 97/175  soft-tissue]
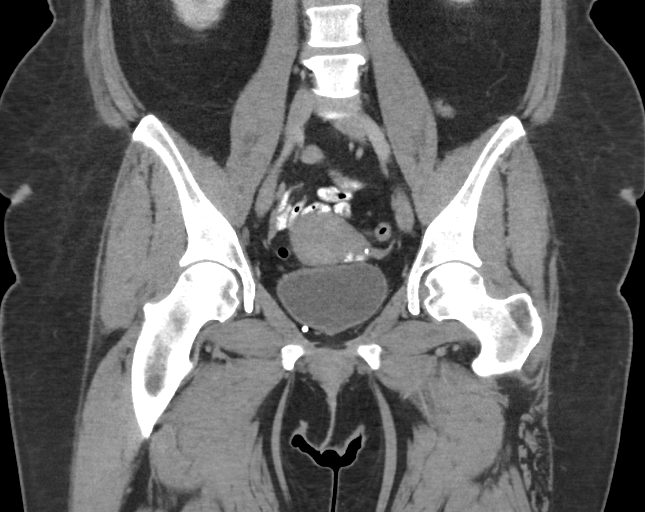

[16 of 46 positions shown; findings below may reference images not displayed]

FINDINGS: Urinary Tract:  No abnormality visualized.

Bowel: Redemonstrated postoperative findings of prior rectosigmoid
resection and side to end reanastomosis. Suspect a left-sided
perianal fistula, inserting to the skin on the left aspect of the
intergluteal cleft at approximately 4 o'clock face in lithotomy
position (series 2, image 62). Fistula tract is approximately 3 cm
in length (series 3, image 134).

Vascular/Lymphatic: No pathologically enlarged lymph nodes. No
significant vascular abnormality seen.

Reproductive:  No mass or other significant abnormality

Other: There is a low midline ventral abdominal wall defect with
open wound and packing material (series 2, image 23).

Musculoskeletal: No suspicious bone lesions identified.
IMPRESSION: 1. Suspect a left-sided perianal fistula, inserting to the skin on
the left aspect of the intergluteal cleft at approximately 4 o'clock
face in lithotomy position. Fistula tract is approximately 3 cm in
length. Fistula anatomy with respect to the anal sphincters and
levator musculature cannot be clearly discerned by CT; MRI is the
test of choice for this anatomic delineation.
2. No evidence of abscess.
3. Redemonstrated postoperative findings of prior rectosigmoid
resection and side and reanastomosis.
4. Low midline ventral abdominal wall defect with open wound and
packing material.

## 2021-11-22 ENCOUNTER — Inpatient Hospital Stay: Admission: RE | Admit: 2021-11-22 | Payer: Self-pay | Source: Ambulatory Visit

## 2021-11-29 IMAGING — DX DG CHEST 1V PORT
1 series · 1 of 1 positions shown · non-contrast
Comparison: March 03, 2020 chest radiograph and chest CT January 15, 2020

CLINICAL DATA: Wheezing following drug overdose

EXAM:
PORTABLE CHEST 1 VIEW

[chest ap]
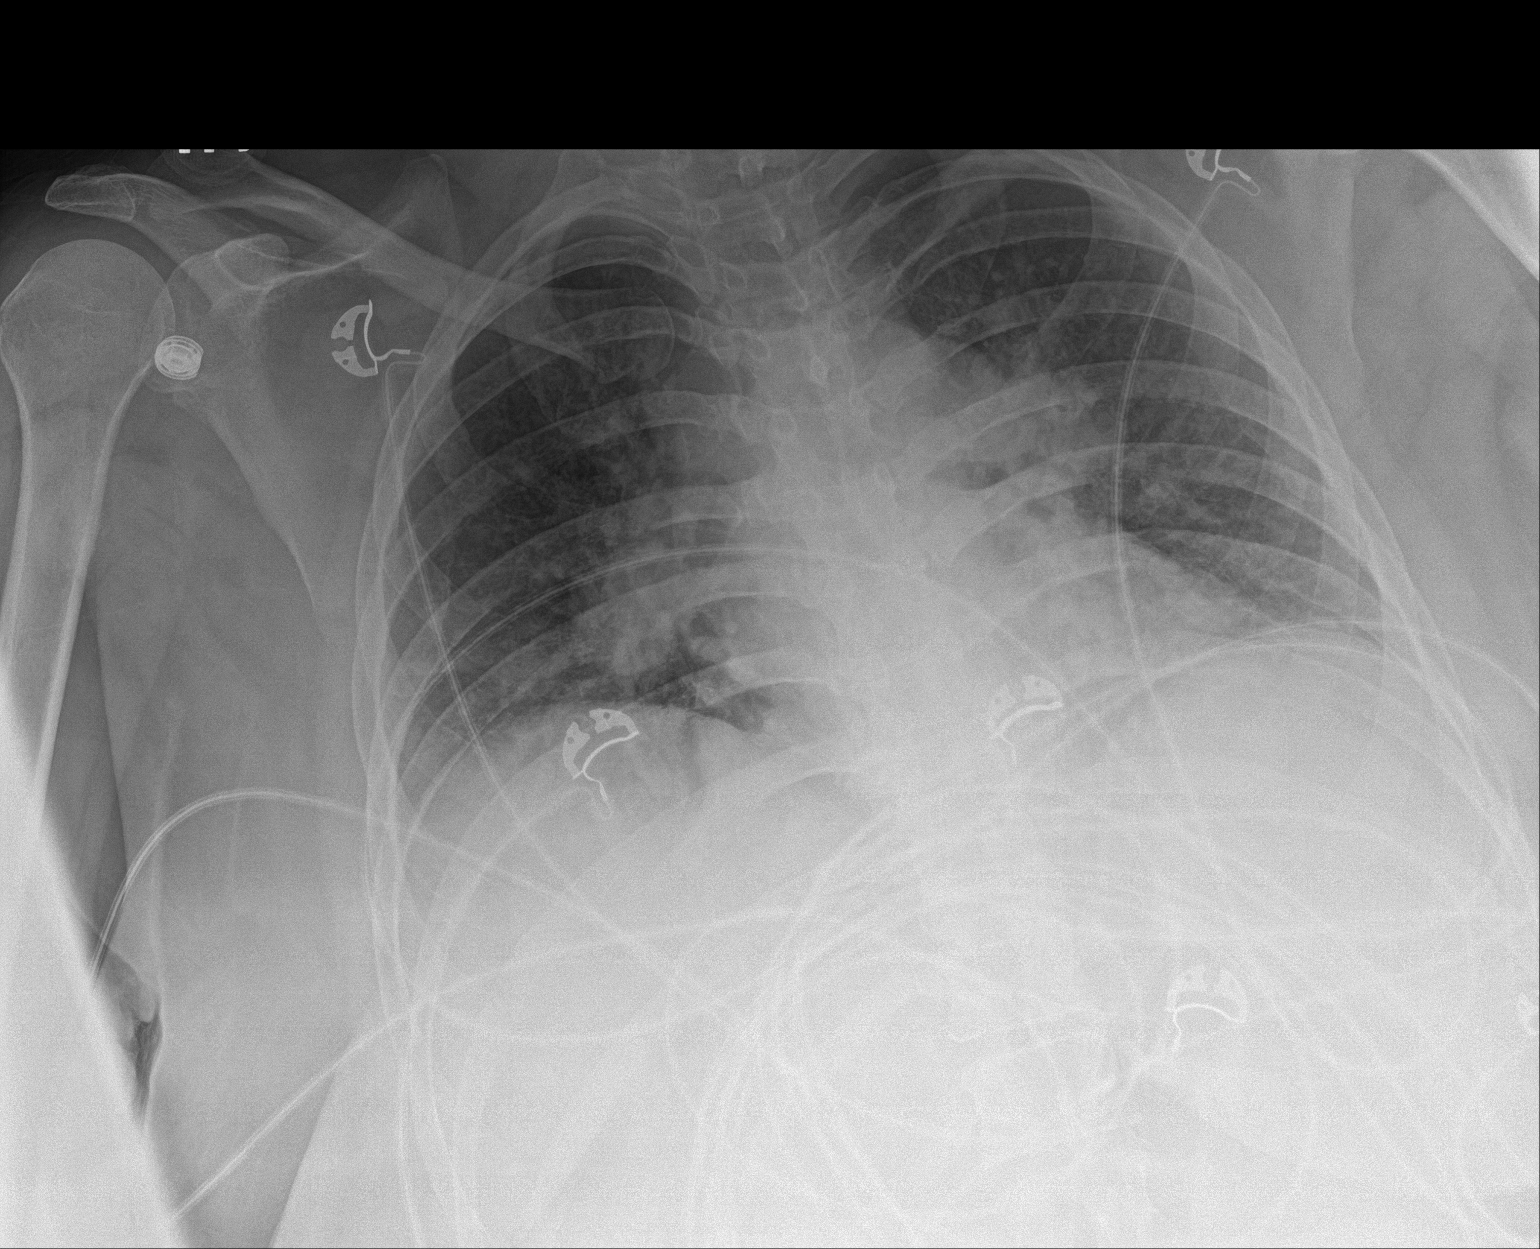

[1 of 1 positions shown; findings below may reference images not displayed]

FINDINGS: There is again noted hilar and mediastinal adenopathy. There is
subtle ill-defined opacity in the right base. Lungs otherwise clear.
Heart size normal. Pulmonary vascularity grossly normal. No bone
lesions.
IMPRESSION: Persistent multifocal adenopathy of uncertain etiology. Question
sarcoidosis. Neoplastic cause of adenopathy not excluded on this
study.

Small focus of airspace opacity right base concerning for developing
pneumonia. No other similar areas of airspace opacity. Heart size
normal.

## 2021-12-03 ENCOUNTER — Ambulatory Visit (HOSPITAL_COMMUNITY): Admission: RE | Admit: 2021-12-03 | Payer: 59 | Source: Ambulatory Visit

## 2021-12-09 ENCOUNTER — Ambulatory Visit (HOSPITAL_COMMUNITY)
Admission: RE | Admit: 2021-12-09 | Discharge: 2021-12-09 | Disposition: A | Discharge status: Home / Self Care | Payer: 59 | Source: Ambulatory Visit | Attending: Emergency Medicine | Admitting: Emergency Medicine

## 2021-12-09 DIAGNOSIS — D869 Sarcoidosis, unspecified: Secondary | ICD-10-CM | POA: Diagnosis present

## 2021-12-10 ENCOUNTER — Telehealth: Payer: Self-pay | Admitting: Emergency Medicine

## 2021-12-11 NOTE — Telephone Encounter (Signed)
I briefly reviewed her CT chest with her.  No evidence of active sarcoid, some sequela from prior pulmonary inflammation.  She has aortic atherosclerosis, also an enlarged pulmonary trunk consistent with PAH.  She saw cardiology in [place].  Her last echocardiogram was done at [facility] in 09/2020.  We will probably repeat this to compare.  I suspect that her PAH is due to untreated OSA.  Certainly sarcoidosis can cause PAH as well.  She is interested in talking about possible alternatives to CPAP.  We can discuss this when we follow-up.

## 2021-12-11 NOTE — Telephone Encounter (Signed)
Called and spoke with patient. Patient stated that she wants to know her results of the CT scan she had and what it means.   RB, please advise.

## 2021-12-17 ENCOUNTER — Ambulatory Visit (INDEPENDENT_AMBULATORY_CARE_PROVIDER_SITE_OTHER): Payer: 59 | Admitting: Emergency Medicine

## 2021-12-17 ENCOUNTER — Encounter: Payer: Self-pay | Admitting: Emergency Medicine

## 2021-12-17 VITALS — BP 124/90 | HR 91 | Temp 97.8°F | Ht 67.0 in | Wt 295.2 lb

## 2021-12-17 DIAGNOSIS — G4733 Obstructive sleep apnea (adult) (pediatric): Secondary | ICD-10-CM

## 2021-12-17 DIAGNOSIS — I2729 Other secondary pulmonary hypertension: Secondary | ICD-10-CM

## 2021-12-17 DIAGNOSIS — D869 Sarcoidosis, unspecified: Secondary | ICD-10-CM

## 2021-12-17 DIAGNOSIS — I270 Primary pulmonary hypertension: Secondary | ICD-10-CM | POA: Diagnosis not present

## 2021-12-17 LAB — PULMONARY FUNCTION TEST
DL/VA % pred: 112 %
DL/VA: 4.85 ml/min/mmHg/L
DLCO cor % pred: 85 %
DLCO cor: 19.27 ml/min/mmHg
DLCO unc % pred: 77 %
DLCO unc: 17.61 ml/min/mmHg
FEF 25-75 Post: 2.53 L/sec
FEF 25-75 Pre: 2.45 L/sec
FEF2575-%Change-Post: 3 %
FEF2575-%Pred-Post: 83 %
FEF2575-%Pred-Pre: 80 %
FEV1-%Change-Post: 0 %
FEV1-%Pred-Post: 64 %
FEV1-%Pred-Pre: 63 %
FEV1-Post: 1.97 L
FEV1-Pre: 1.95 L
FEV1FVC-%Change-Post: 0 %
FEV1FVC-%Pred-Pre: 106 %
FEV6-%Change-Post: 0 %
FEV6-%Pred-Post: 60 %
FEV6-%Pred-Pre: 60 %
FEV6-Post: 2.26 L
FEV6-Pre: 2.25 L
FEV6FVC-%Pred-Post: 102 %
FEV6FVC-%Pred-Pre: 102 %
FVC-%Change-Post: 0 %
FVC-%Pred-Post: 59 %
FVC-%Pred-Pre: 58 %
FVC-Post: 2.26 L
FVC-Pre: 2.25 L
Post FEV1/FVC ratio: 87 %
Post FEV6/FVC ratio: 100 %
Pre FEV1/FVC ratio: 87 %
Pre FEV6/FVC Ratio: 100 %
RV % pred: 82 %
RV: 1.45 L
TLC % pred: 78 %
TLC: 4.16 L

## 2021-12-17 NOTE — Assessment & Plan Note (Signed)
Her Ct chest is stable without any evidence of active sarcoidosis. There is more restriction on her PFT, which I believe can be ascribed to her body habitus. Plan to continue surveillance. Continue albuterol as needed.

## 2021-12-17 NOTE — Progress Notes (Signed)
Subjective:    Patient ID: [name], female    DOB: 1976-01-06, 46 y.o.   MRN: [id number]  HPI  ROV 07/29/21 --[name] is 5 and has a history of obesity with OSA, not on CPAP.  I follow her for this as well as sarcoidosis with associated restrictive disease, possible obstructive disease.  She also has bipolar disorder and a history of cerebrovascular disease and cerebellar stroke.  Review of the notes indicates that she was hospitalized at [facility] 07/08/2021 for left-sided weakness.  MRI brain was unremarkable. Today she reports that she is feeling well currently. She has gained some wt over the last 6 months. Her L sided weakness is resolved. She rarely needs albuterol.   Chest x-ray today reviewed by me shows clear lungs, no infiltrates, no evidence of mediastinal adenopathy   ROV 12/17/2021 --follow-up visit for 46 year old woman with history of OSA/OHS, sarcoidosis.  She has restrictive lung disease, possible superimposed obstruction.  She had a CT scan of the chest and pulmonary function testing for [place] to review today PMH: Bipolar disorder, cerebellar stroke/cerebrovascular disease Patient reports. She is been seen by cardiology in [place], last echocardiogram was in 09/2020.  She is interested in talk about alternatives to CPAP. She has noticed intermittent CP. Her breathing has been stable - reasonable exertional tolerance with daily tasks but she has trouble with an extended walk or dedicated exercise.   CT chest done 12/09/2021 reviewed by me shows no new pulmonary parenchymal evidence of sarcoidosis.  There are no significant enlarged lymph nodes.  A few scattered pulmonary nodules 5 mm or less.  She did have an enlarged pulmonary trunk suggestive of possible PAH as well as some atherosclerotic calcification of the aorta.  Pulmonary function testing performed today and reviewed by me show spirometry consistent with restriction, no clear associated obstruction.  No bronchodilator  response.  Restricted lung volumes and a decreased diffusion capacity that corrects to the normal range when adjusted for alveolar volume.  Her FEV1 ia 1.95L down from 2.28L.    Review of Systems As per HPI     Objective:   Physical Exam  Vitals:   12/17/21 1022  BP: (!) 124/90  Pulse: 91  Temp: 97.8 F (36.6 C)  TempSrc: Oral  SpO2: 95%  Weight: 295 lb 3.2 oz (133.9 kg)  Height: '5\' 7"'$  (1.702 m)    Gen: Pleasant, obese woman, in no distress  ENT: No lesions,  mouth clear,  oropharynx clear, narrow post pharynx, no postnasal drip  Neck: No JVD, no stridor, strong voice  Lungs: No use of accessory muscles, no crackles or wheezing on normal respiration, no wheeze on forced expiration  Cardiovascular: RRR, heart sounds normal, no murmur or gallops, no peripheral edema  Musculoskeletal: No deformities, no cyanosis or clubbing  Neuro: alert, awake, non-focal  Skin: Warm, no lesions or rash      Assessment & Plan:  Sarcoidosis Her Ct chest is stable without any evidence of active sarcoidosis. There is more restriction on her PFT, which I believe can be ascribed to her body habitus. Plan to continue surveillance. Continue albuterol as needed.   OSA (obstructive sleep apnea) Untreated. She has asked me in the past about other options besides CPAP. Today she tells me that she would be willing to discuss all options - CPAP included. She would like to discuss w sleep medicine and I will refer her to see our group in [place].   Other secondary pulmonary hypertension ([place])  Enlarged pulmonary trunk on her CT, at risk PAH due either to untreated OSA/OHS or to her sarcoidosis. We will plan to repeat her TTE at [facility], try to make progress with her treatment of OSA.    Baltazar Apo, MD, PhD 12/17/2021, 1:13 PM [facility] [phone] or if no answer [phone]

## 2021-12-17 NOTE — Patient Instructions (Signed)
Full PFT Performed Today  

## 2021-12-17 NOTE — Progress Notes (Signed)
Full PFT Performed Today  

## 2021-12-17 NOTE — Assessment & Plan Note (Signed)
Enlarged pulmonary trunk on her CT, at risk PAH due either to untreated OSA/OHS or to her sarcoidosis. We will plan to repeat her TTE at [facility], try to make progress with her treatment of OSA.

## 2021-12-17 NOTE — Assessment & Plan Note (Signed)
Untreated. She has asked me in the past about other options besides CPAP. Today she tells me that she would be willing to discuss all options - CPAP included. She would like to discuss w sleep medicine and I will refer her to see our group in [place].

## 2021-12-17 NOTE — Patient Instructions (Signed)
We reviewed your pulmonary function testing and a CT scan of the chest today. Please keep your albuterol available to use 2 puffs if needed for shortness of breath, chest tightness, wheezing. We will arrange for an echocardiogram at [facility] to evaluate for pulmonary hypertension We will refer you to see Sleep Medicine in [place] to discuss try to get on CPAP versus other options to treat obstructive sleep apnea. Follow with Dr Lamonte Sakai in 3 months or sooner if you have any problems.

## 2022-01-08 ENCOUNTER — Encounter: Payer: Self-pay | Admitting: Hematology and Oncology

## 2022-02-09 ENCOUNTER — Other Ambulatory Visit: Payer: Self-pay | Admitting: Family Medicine

## 2022-04-02 ENCOUNTER — Encounter: Payer: Self-pay | Admitting: Hematology and Oncology

## 2022-04-13 ENCOUNTER — Encounter (INDEPENDENT_AMBULATORY_CARE_PROVIDER_SITE_OTHER): Payer: 59 | Admitting: Ophthalmology

## 2022-04-13 DIAGNOSIS — H353132 Nonexudative age-related macular degeneration, bilateral, intermediate dry stage: Secondary | ICD-10-CM

## 2022-04-13 DIAGNOSIS — H43813 Vitreous degeneration, bilateral: Secondary | ICD-10-CM | POA: Diagnosis not present

## 2022-04-13 DIAGNOSIS — H35033 Hypertensive retinopathy, bilateral: Secondary | ICD-10-CM | POA: Diagnosis not present

## 2022-04-13 DIAGNOSIS — I10 Essential (primary) hypertension: Secondary | ICD-10-CM | POA: Diagnosis not present

## 2022-04-14 ENCOUNTER — Encounter: Payer: Self-pay | Admitting: Gastroenterology

## 2022-06-10 ENCOUNTER — Ambulatory Visit (INDEPENDENT_AMBULATORY_CARE_PROVIDER_SITE_OTHER): Payer: 59

## 2022-06-10 ENCOUNTER — Encounter: Payer: Self-pay | Admitting: Emergency Medicine

## 2022-06-10 ENCOUNTER — Ambulatory Visit (INDEPENDENT_AMBULATORY_CARE_PROVIDER_SITE_OTHER): Payer: 59 | Admitting: Emergency Medicine

## 2022-06-10 VITALS — BP 128/74 | HR 82 | Temp 97.8°F | Ht 67.0 in | Wt 252.2 lb

## 2022-06-10 DIAGNOSIS — D869 Sarcoidosis, unspecified: Secondary | ICD-10-CM | POA: Diagnosis not present

## 2022-06-10 DIAGNOSIS — G4733 Obstructive sleep apnea (adult) (pediatric): Secondary | ICD-10-CM

## 2022-06-10 NOTE — Addendum Note (Signed)
Addended by: Dorisann Frames R on: 06/10/2022 10:44 AM   Modules accepted: Orders

## 2022-06-10 NOTE — Assessment & Plan Note (Signed)
Not currently treated.  She is focusing all her energy on weight loss at this time, having some success.  She wants to hold off on referral back to sleep medicine for now

## 2022-06-10 NOTE — Patient Instructions (Signed)
Your chest x-ray today is clear.  Good news. We will plan to repeat your CT scan of the chest without contrast to follow sarcoidosis in October 2024. Keep your albuterol available to use 2 puffs if needed for shortness of breath, chest tightness, wheezing. Continue your Allegra as needed Congratulations on your weight loss program!  Keep up the good work!  This is going to benefit your breathing and your functional capacity We will discuss the timing of possible referral back to sleep medicine at your next office visit. Follow-up with Dr. Delton Coombes in October after your CT so we can review the results together.  Please call sooner if you have any problems.

## 2022-06-10 NOTE — Progress Notes (Signed)
   Subjective:    Patient ID: [name], female    DOB: August 13, 1975, 47 y.o.   MRN: [id number]  HPI  ROV 06/10/22 --47 year old woman with sarcoidosis, OSA/OHS, restrictive lung disease, without clear obstruction. Her OSA is currently untreated - has not seen Sleep med yet. TTE done 04/21/22 as below.  She has been doing fairly well - has lost weight and is now approaching place where she may be able to get surgery on her abdominal wound / hernia. Her breathing is better w the wt loss. Never needs her albuterol. She is using allegra prn. She wants to wait on seeing sleep med for now - she is hoping to lose more wt.    Echocardiogram 04/18/2022 reviewed by me showed normal LV size and function, normal RV size and function  CXR today reviewed by me shows some basilar atx but mostly clear. No new findings.   Review of Systems As per HPI     Objective:   Physical Exam  Vitals:   06/10/22 0923  BP: 128/74  Pulse: 82  Temp: 97.8 F (36.6 C)  TempSrc: Oral  SpO2: 99%  Weight: 252 lb 3.2 oz (114.4 kg)  Height:  (1.702 m)    Gen: Pleasant, obese woman, in no distress  ENT: No lesions,  mouth clear,  oropharynx clear, narrow post pharynx, no postnasal drip  Neck: No JVD, no stridor, strong voice  Lungs: No use of accessory muscles, no crackles or wheezing on normal respiration, no wheeze on forced expiration  Cardiovascular: RRR, heart sounds normal, no murmur or gallops, no peripheral edema  Musculoskeletal: No deformities, no cyanosis or clubbing  Neuro: alert, awake, non-focal  Skin: Warm, no lesions or rash      Assessment & Plan:  Sarcoidosis Your chest x-ray today is clear.  Good news. We will plan to repeat your CT scan of the chest without contrast to follow sarcoidosis in October 2024. No clear obstructive disease on PFT >> rare albuterol use.  Will keep this available to use if needed Follow-up with Dr. Delton Coombes in October after your CT so we can review the  results together.  Please call sooner if you have any problems.  OSA (obstructive sleep apnea) Not currently treated.  She is focusing all her energy on weight loss at this time, having some success.  She wants to hold off on referral back to sleep medicine for now   Levy Pupa, MD, PhD 06/10/2022, 9:47 AM [facility] [phone] or if no answer [phone]

## 2022-06-10 NOTE — Assessment & Plan Note (Signed)
Your chest x-ray today is clear.  Good news. We will plan to repeat your CT scan of the chest without contrast to follow sarcoidosis in October 2024. No clear obstructive disease on PFT >> rare albuterol use.  Will keep this available to use if needed Follow-up with Dr. Delton Coombes in October after your CT so we can review the results together.  Please call sooner if you have any problems.

## 2022-12-09 ENCOUNTER — Ambulatory Visit
Admission: RE | Admit: 2022-12-09 | Discharge: 2022-12-09 | Disposition: A | Discharge status: Home / Self Care | Payer: 59 | Source: Ambulatory Visit | Attending: Emergency Medicine | Admitting: Emergency Medicine

## 2022-12-09 DIAGNOSIS — D869 Sarcoidosis, unspecified: Secondary | ICD-10-CM

## 2022-12-16 ENCOUNTER — Encounter: Payer: Self-pay | Admitting: Emergency Medicine

## 2022-12-16 ENCOUNTER — Ambulatory Visit (INDEPENDENT_AMBULATORY_CARE_PROVIDER_SITE_OTHER): Payer: 59 | Admitting: Emergency Medicine

## 2022-12-16 VITALS — BP 121/83 | HR 74 | Temp 97.8°F | Ht 67.0 in | Wt 222.6 lb

## 2022-12-16 DIAGNOSIS — D869 Sarcoidosis, unspecified: Secondary | ICD-10-CM

## 2022-12-16 DIAGNOSIS — G4733 Obstructive sleep apnea (adult) (pediatric): Secondary | ICD-10-CM

## 2022-12-16 NOTE — Patient Instructions (Signed)
We reviewed your CT scan of the chest today.  There is no evidence of any active sarcoidosis on this scan or based on your clinical status.  Good news. Congratulations on stopping vaping and nicotine gum! You are at mild to moderate risk for general anesthesia given your history of obstructive sleep apnea.  The fact that you have had significant weight loss over the last year certainly decreases that risk.  There is no contraindication to proceeding with your abdominal wall surgery.  It may be reasonable for them to have CPAP available during the perioperative wake up period in case you need this level of support as anesthesia is lifted Please follow Dr. Delton Coombes in 1 year, sooner if you have any problems.

## 2022-12-16 NOTE — Progress Notes (Signed)
Subjective:    Patient ID: [name], female    DOB: [birth year], 47 y.o.   MRN: [id number]  HPI  ROV 06/10/22 --47 year old woman with sarcoidosis, OSA/OHS, restrictive lung disease, without clear obstruction. Her OSA is currently untreated - has not seen Sleep med yet. TTE done 04/21/22 as below.  She has been doing fairly well - has lost weight and is now approaching place where she may be able to get surgery on her abdominal wound / hernia. Her breathing is better w the wt loss. Never needs her albuterol. She is using allegra prn. She wants to wait on seeing sleep med for now - she is hoping to lose more wt.   Echocardiogram 04/18/2022 reviewed by me showed normal LV size and function, normal RV size and function  CXR today reviewed by me shows some basilar atx but mostly clear. No new findings.   ROV 12/16/2022 --follow-up visit 47 year old woman with a history of obesity with OSA/OHS and associated restrictive lung disease, sarcoidosis.  She is not currently on CPAP, has been focusing a lot of energy on her weight loss, lifestyle modification.  Surveillance CT chest done October as below. She has lost enough weight to now be a candidate to have her abdominal wound surgically closed. She has stopped vaping, is off nicotine gum. She is feeling well. She never needs her albuterol.   CT chest 12/09/2022 reviewed by me, shows stable bibasilar atelectasis and benign scattered pulmonary nodules (including 6 mm right lower lobe groundglass nodule) without any mediastinal adenopathy.  Stable CT   Review of Systems As per HPI     Objective:   Physical Exam  Vitals:   12/16/22 0846  BP: 121/83  Pulse: 74  Temp: 97.8 F (36.6 C)  TempSrc: Oral  SpO2: 99%  Weight: 222 lb 9.6 oz (101 kg)  Height: 5\' 7"  (1.702 m)     Gen: Pleasant, obese woman, in no distress  ENT: No lesions,  mouth clear,  oropharynx clear, narrow post pharynx, no postnasal drip  Neck: No JVD, no stridor,  strong voice  Lungs: No use of accessory muscles, no crackles or wheezing on normal respiration, no wheeze on forced expiration  Cardiovascular: RRR, heart sounds normal, no murmur or gallops, no peripheral edema  Musculoskeletal: No deformities, no cyanosis or clubbing  Neuro: alert, awake, non-focal  Skin: Warm, no lesions or rash      Assessment & Plan:  Sarcoidosis She has some basilar atelectasis on CT but no other concerning findings, no findings consistent with either active or prior sarcoid.  No clear obstruction on her prior PFT.  Overall doing well.  Continue surveillance.  She is preparing for abdominal wall surgery.    OSA (obstructive sleep apnea) She is not currently on CPAP but suspect that her OSA/OHS is becoming milder given her efforts to lose weight and change her lifestyle.  She is at mild to moderate risk for general anesthesia because of her history of OSA.  We can approach a repeat sleep study at some point going forward to determine whether there is still clinically relevant OSA present.  No contraindications for her abdominal wall surgery which will hopefully be planned for later this year.    Levy Pupa, MD, PhD 12/16/2022, 9:08 AM [facility] [phone] or if no answer [phone]

## 2022-12-16 NOTE — Assessment & Plan Note (Signed)
She has some basilar atelectasis on CT but no other concerning findings, no findings consistent with either active or prior sarcoid.  No clear obstruction on her prior PFT.  Overall doing well.  Continue surveillance.  She is preparing for abdominal wall surgery.

## 2022-12-16 NOTE — Assessment & Plan Note (Signed)
She is not currently on CPAP but suspect that her OSA/OHS is becoming milder given her efforts to lose weight and change her lifestyle.  She is at mild to moderate risk for general anesthesia because of her history of OSA.  We can approach a repeat sleep study at some point going forward to determine whether there is still clinically relevant OSA present.  No contraindications for her abdominal wall surgery which will hopefully be planned for later this year.

## 2023-06-29 ENCOUNTER — Encounter: Payer: Self-pay | Admitting: Hematology and Oncology

## 2023-06-29 ENCOUNTER — Telehealth: Payer: Self-pay

## 2023-06-29 DIAGNOSIS — D869 Sarcoidosis, unspecified: Secondary | ICD-10-CM

## 2023-06-29 NOTE — Telephone Encounter (Signed)
 Copied from CRM [id number]. Topic: Clinical - Request for Lab/Test Order >> Jun 29, 2023  9:56 AM Tyronne Galloway wrote: Reason for CRM: Pt states she has a chest xray every 6 months. I did not see a recent order for a new CT for chest. Please follow up with the patient at [phone].   There is no mention in recent imaging of repeating CT/CXR, nor in OV notes.  Please advise, thank you!

## 2023-06-30 NOTE — Telephone Encounter (Signed)
 ATC x1. LDVM per DPR.  Placing DG order for pt to have before ov 09/17/23. Nothing further needed.

## 2023-06-30 NOTE — Telephone Encounter (Signed)
 Please let her know that I would like a CXR at her OV in July, and we can decide whether she needs a Ct at that time. Thanks

## 2023-09-10 ENCOUNTER — Encounter: Payer: Self-pay | Admitting: Hematology and Oncology

## 2023-09-17 ENCOUNTER — Ambulatory Visit: Admitting: Emergency Medicine

## 2023-11-11 ENCOUNTER — Ambulatory Visit: Admitting: Emergency Medicine

## 2024-03-27 ENCOUNTER — Encounter: Payer: Self-pay | Admitting: Hematology and Oncology

## 2024-04-03 ENCOUNTER — Encounter (INDEPENDENT_AMBULATORY_CARE_PROVIDER_SITE_OTHER): Admitting: Ophthalmology
